# Patient Record
Sex: Male | Born: 1975 | Race: White | Hispanic: No | Marital: Married | Smoking: Former smoker
Health system: Southern US, Community
[De-identification: ages and names within clinical notes are randomized; demographics above are authoritative.]

## PROBLEM LIST (undated history)

## (undated) DIAGNOSIS — D649 Anemia, unspecified: Secondary | ICD-10-CM

## (undated) DIAGNOSIS — R0602 Shortness of breath: Secondary | ICD-10-CM

## (undated) DIAGNOSIS — K7460 Unspecified cirrhosis of liver: Secondary | ICD-10-CM

## (undated) DIAGNOSIS — K269 Duodenal ulcer, unspecified as acute or chronic, without hemorrhage or perforation: Secondary | ICD-10-CM

## (undated) DIAGNOSIS — I319 Disease of pericardium, unspecified: Secondary | ICD-10-CM

## (undated) DIAGNOSIS — K50913 Crohn's disease, unspecified, with fistula: Secondary | ICD-10-CM

## (undated) DIAGNOSIS — K760 Fatty (change of) liver, not elsewhere classified: Secondary | ICD-10-CM

## (undated) DIAGNOSIS — K7400 Hepatic fibrosis, unspecified: Secondary | ICD-10-CM

## (undated) DIAGNOSIS — K219 Gastro-esophageal reflux disease without esophagitis: Secondary | ICD-10-CM

## (undated) DIAGNOSIS — I313 Pericardial effusion (noninflammatory): Secondary | ICD-10-CM

## (undated) DIAGNOSIS — Z8711 Personal history of peptic ulcer disease: Secondary | ICD-10-CM

## (undated) DIAGNOSIS — I10 Essential (primary) hypertension: Secondary | ICD-10-CM

## (undated) DIAGNOSIS — K603 Anal fistula: Secondary | ICD-10-CM

## (undated) DIAGNOSIS — F32A Depression, unspecified: Secondary | ICD-10-CM

## (undated) DIAGNOSIS — K769 Liver disease, unspecified: Secondary | ICD-10-CM

## (undated) DIAGNOSIS — N200 Calculus of kidney: Secondary | ICD-10-CM

## (undated) DIAGNOSIS — N529 Male erectile dysfunction, unspecified: Secondary | ICD-10-CM

## (undated) DIAGNOSIS — K759 Inflammatory liver disease, unspecified: Secondary | ICD-10-CM

## (undated) DIAGNOSIS — K5090 Crohn's disease, unspecified, without complications: Secondary | ICD-10-CM

## (undated) DIAGNOSIS — F329 Major depressive disorder, single episode, unspecified: Secondary | ICD-10-CM

## (undated) DIAGNOSIS — Z9289 Personal history of other medical treatment: Secondary | ICD-10-CM

## (undated) DIAGNOSIS — F419 Anxiety disorder, unspecified: Secondary | ICD-10-CM

## (undated) DIAGNOSIS — J189 Pneumonia, unspecified organism: Secondary | ICD-10-CM

## (undated) DIAGNOSIS — R160 Hepatomegaly, not elsewhere classified: Secondary | ICD-10-CM

## (undated) DIAGNOSIS — Z8719 Personal history of other diseases of the digestive system: Secondary | ICD-10-CM

## (undated) HISTORY — PX: CHOLECYSTECTOMY: SHX55

## (undated) HISTORY — DX: Inflammatory liver disease, unspecified: K75.9

## (undated) HISTORY — PX: OTHER SURGICAL HISTORY: SHX169

## (undated) HISTORY — DX: Anal fistula: K60.3

## (undated) HISTORY — DX: Male erectile dysfunction, unspecified: N52.9

## (undated) HISTORY — DX: Crohn's disease, unspecified, without complications: K50.90

## (undated) HISTORY — DX: Anxiety disorder, unspecified: F41.9

## (undated) HISTORY — PX: APPENDECTOMY: SHX54

## (undated) HISTORY — DX: Fatty (change of) liver, not elsewhere classified: K76.0

## (undated) HISTORY — DX: Hepatomegaly, not elsewhere classified: R16.0

## (undated) HISTORY — DX: Gastro-esophageal reflux disease without esophagitis: K21.9

## (undated) HISTORY — DX: Crohn's disease, unspecified, with fistula: K50.913

---

## 2001-04-07 ENCOUNTER — Encounter: Payer: Self-pay | Admitting: Internal Medicine

## 2001-04-07 ENCOUNTER — Ambulatory Visit (HOSPITAL_COMMUNITY): Admission: RE | Admit: 2001-04-07 | Discharge: 2001-04-07 | Payer: Self-pay | Admitting: Internal Medicine

## 2001-04-20 ENCOUNTER — Ambulatory Visit (HOSPITAL_COMMUNITY): Admission: RE | Admit: 2001-04-20 | Discharge: 2001-04-20 | Payer: Self-pay | Admitting: Internal Medicine

## 2001-09-29 ENCOUNTER — Encounter (INDEPENDENT_AMBULATORY_CARE_PROVIDER_SITE_OTHER): Payer: Self-pay | Admitting: Internal Medicine

## 2001-09-29 ENCOUNTER — Ambulatory Visit (HOSPITAL_COMMUNITY): Admission: RE | Admit: 2001-09-29 | Discharge: 2001-09-29 | Payer: Self-pay | Admitting: Internal Medicine

## 2001-10-10 ENCOUNTER — Encounter (INDEPENDENT_AMBULATORY_CARE_PROVIDER_SITE_OTHER): Payer: Self-pay | Admitting: Internal Medicine

## 2001-10-10 ENCOUNTER — Ambulatory Visit (HOSPITAL_COMMUNITY): Admission: RE | Admit: 2001-10-10 | Discharge: 2001-10-10 | Payer: Self-pay | Admitting: Internal Medicine

## 2001-12-21 ENCOUNTER — Ambulatory Visit (HOSPITAL_COMMUNITY): Admission: RE | Admit: 2001-12-21 | Discharge: 2001-12-21 | Payer: Self-pay | Admitting: Internal Medicine

## 2002-03-02 ENCOUNTER — Encounter: Payer: Self-pay | Admitting: *Deleted

## 2002-03-02 ENCOUNTER — Inpatient Hospital Stay (HOSPITAL_COMMUNITY): Admission: EM | Admit: 2002-03-02 | Discharge: 2002-03-05 | Payer: Self-pay | Admitting: *Deleted

## 2002-05-16 ENCOUNTER — Ambulatory Visit (HOSPITAL_COMMUNITY): Admission: RE | Admit: 2002-05-16 | Discharge: 2002-05-16 | Payer: Self-pay | Admitting: Internal Medicine

## 2002-05-17 ENCOUNTER — Emergency Department (HOSPITAL_COMMUNITY): Admission: EM | Admit: 2002-05-17 | Discharge: 2002-05-17 | Payer: Self-pay | Admitting: *Deleted

## 2002-05-17 ENCOUNTER — Encounter (INDEPENDENT_AMBULATORY_CARE_PROVIDER_SITE_OTHER): Payer: Self-pay | Admitting: Internal Medicine

## 2002-06-23 ENCOUNTER — Ambulatory Visit (HOSPITAL_COMMUNITY): Admission: RE | Admit: 2002-06-23 | Discharge: 2002-06-23 | Payer: Self-pay | Admitting: General Surgery

## 2002-12-14 DIAGNOSIS — Z9289 Personal history of other medical treatment: Secondary | ICD-10-CM

## 2002-12-14 HISTORY — DX: Personal history of other medical treatment: Z92.89

## 2002-12-14 HISTORY — PX: GASTROJEJUNOSTOMY: SHX1697

## 2002-12-29 ENCOUNTER — Ambulatory Visit (HOSPITAL_COMMUNITY): Admission: RE | Admit: 2002-12-29 | Discharge: 2002-12-29 | Payer: Self-pay | Admitting: General Surgery

## 2003-03-16 ENCOUNTER — Emergency Department (HOSPITAL_COMMUNITY): Admission: EM | Admit: 2003-03-16 | Discharge: 2003-03-16 | Payer: Self-pay | Admitting: *Deleted

## 2003-03-16 ENCOUNTER — Encounter: Payer: Self-pay | Admitting: *Deleted

## 2003-03-27 ENCOUNTER — Ambulatory Visit (HOSPITAL_COMMUNITY): Admission: RE | Admit: 2003-03-27 | Discharge: 2003-03-27 | Payer: Self-pay | Admitting: Internal Medicine

## 2003-03-27 ENCOUNTER — Encounter: Payer: Self-pay | Admitting: Internal Medicine

## 2003-03-30 ENCOUNTER — Ambulatory Visit (HOSPITAL_COMMUNITY): Admission: RE | Admit: 2003-03-30 | Discharge: 2003-03-30 | Payer: Self-pay | Admitting: Internal Medicine

## 2003-03-30 ENCOUNTER — Encounter (INDEPENDENT_AMBULATORY_CARE_PROVIDER_SITE_OTHER): Payer: Self-pay | Admitting: Internal Medicine

## 2003-04-11 ENCOUNTER — Encounter (INDEPENDENT_AMBULATORY_CARE_PROVIDER_SITE_OTHER): Payer: Self-pay | Admitting: Internal Medicine

## 2003-04-11 ENCOUNTER — Ambulatory Visit (HOSPITAL_COMMUNITY): Admission: RE | Admit: 2003-04-11 | Discharge: 2003-04-11 | Payer: Self-pay | Admitting: Internal Medicine

## 2003-07-01 ENCOUNTER — Emergency Department (HOSPITAL_COMMUNITY): Admission: EM | Admit: 2003-07-01 | Discharge: 2003-07-02 | Payer: Self-pay | Admitting: *Deleted

## 2003-07-02 ENCOUNTER — Encounter: Payer: Self-pay | Admitting: *Deleted

## 2003-09-04 ENCOUNTER — Inpatient Hospital Stay (HOSPITAL_COMMUNITY): Admission: EM | Admit: 2003-09-04 | Discharge: 2003-09-10 | Payer: Self-pay | Admitting: Emergency Medicine

## 2003-09-05 ENCOUNTER — Encounter (INDEPENDENT_AMBULATORY_CARE_PROVIDER_SITE_OTHER): Payer: Self-pay | Admitting: Internal Medicine

## 2004-04-04 ENCOUNTER — Ambulatory Visit (HOSPITAL_COMMUNITY): Admission: RE | Admit: 2004-04-04 | Discharge: 2004-04-04 | Payer: Self-pay | Admitting: Internal Medicine

## 2004-12-16 ENCOUNTER — Ambulatory Visit: Payer: Self-pay | Admitting: Internal Medicine

## 2005-02-20 ENCOUNTER — Ambulatory Visit: Payer: Self-pay | Admitting: Internal Medicine

## 2005-02-20 ENCOUNTER — Ambulatory Visit (HOSPITAL_COMMUNITY): Admission: RE | Admit: 2005-02-20 | Discharge: 2005-02-20 | Payer: Self-pay | Admitting: Internal Medicine

## 2005-03-06 ENCOUNTER — Ambulatory Visit: Payer: Self-pay | Admitting: Internal Medicine

## 2005-04-08 ENCOUNTER — Ambulatory Visit: Payer: Self-pay | Admitting: Internal Medicine

## 2005-06-04 ENCOUNTER — Ambulatory Visit: Payer: Self-pay | Admitting: Internal Medicine

## 2005-06-19 ENCOUNTER — Ambulatory Visit (HOSPITAL_COMMUNITY): Admission: RE | Admit: 2005-06-19 | Discharge: 2005-06-19 | Payer: Self-pay | Admitting: Internal Medicine

## 2005-07-13 ENCOUNTER — Ambulatory Visit: Payer: Self-pay | Admitting: Internal Medicine

## 2005-07-13 ENCOUNTER — Ambulatory Visit (HOSPITAL_COMMUNITY): Admission: RE | Admit: 2005-07-13 | Discharge: 2005-07-13 | Payer: Self-pay | Admitting: Family Medicine

## 2005-07-30 ENCOUNTER — Ambulatory Visit: Payer: Self-pay | Admitting: Internal Medicine

## 2005-09-04 ENCOUNTER — Ambulatory Visit: Payer: Self-pay | Admitting: Internal Medicine

## 2005-09-09 ENCOUNTER — Ambulatory Visit (HOSPITAL_COMMUNITY): Admission: RE | Admit: 2005-09-09 | Discharge: 2005-09-09 | Payer: Self-pay | Admitting: Internal Medicine

## 2005-09-18 ENCOUNTER — Ambulatory Visit: Payer: Self-pay | Admitting: Internal Medicine

## 2005-10-15 ENCOUNTER — Ambulatory Visit: Payer: Self-pay | Admitting: Internal Medicine

## 2005-11-10 ENCOUNTER — Ambulatory Visit: Payer: Self-pay | Admitting: Internal Medicine

## 2006-02-01 ENCOUNTER — Ambulatory Visit: Payer: Self-pay | Admitting: Internal Medicine

## 2006-04-08 ENCOUNTER — Ambulatory Visit: Payer: Self-pay | Admitting: Internal Medicine

## 2006-05-02 ENCOUNTER — Emergency Department (HOSPITAL_COMMUNITY): Admission: EM | Admit: 2006-05-02 | Discharge: 2006-05-02 | Payer: Self-pay | Admitting: Emergency Medicine

## 2006-05-03 ENCOUNTER — Ambulatory Visit: Payer: Self-pay | Admitting: Internal Medicine

## 2006-05-06 ENCOUNTER — Ambulatory Visit (HOSPITAL_COMMUNITY): Admission: RE | Admit: 2006-05-06 | Discharge: 2006-05-06 | Payer: Self-pay | Admitting: Internal Medicine

## 2006-05-19 ENCOUNTER — Ambulatory Visit: Payer: Self-pay | Admitting: Internal Medicine

## 2006-05-28 ENCOUNTER — Encounter (HOSPITAL_COMMUNITY): Admission: RE | Admit: 2006-05-28 | Discharge: 2006-06-27 | Payer: Self-pay | Admitting: Internal Medicine

## 2006-05-28 ENCOUNTER — Ambulatory Visit (HOSPITAL_COMMUNITY): Payer: Self-pay | Admitting: Internal Medicine

## 2006-06-03 ENCOUNTER — Ambulatory Visit: Payer: Self-pay | Admitting: Internal Medicine

## 2006-07-28 ENCOUNTER — Ambulatory Visit: Payer: Self-pay | Admitting: Internal Medicine

## 2006-07-29 ENCOUNTER — Ambulatory Visit (HOSPITAL_COMMUNITY): Admission: RE | Admit: 2006-07-29 | Discharge: 2006-07-29 | Payer: Self-pay | Admitting: Internal Medicine

## 2006-07-29 ENCOUNTER — Ambulatory Visit: Payer: Self-pay | Admitting: Internal Medicine

## 2006-08-02 ENCOUNTER — Ambulatory Visit (HOSPITAL_COMMUNITY): Payer: Self-pay | Admitting: Internal Medicine

## 2006-08-02 ENCOUNTER — Encounter (HOSPITAL_COMMUNITY): Admission: RE | Admit: 2006-08-02 | Discharge: 2006-09-01 | Payer: Self-pay | Admitting: Oncology

## 2006-10-07 ENCOUNTER — Ambulatory Visit: Payer: Self-pay | Admitting: Internal Medicine

## 2006-10-11 ENCOUNTER — Ambulatory Visit (HOSPITAL_COMMUNITY): Payer: Self-pay | Admitting: Internal Medicine

## 2006-10-11 ENCOUNTER — Encounter (HOSPITAL_COMMUNITY): Admission: RE | Admit: 2006-10-11 | Discharge: 2006-11-10 | Payer: Self-pay | Admitting: Internal Medicine

## 2006-11-29 ENCOUNTER — Encounter (HOSPITAL_COMMUNITY): Admission: RE | Admit: 2006-11-29 | Discharge: 2006-12-13 | Payer: Self-pay | Admitting: Internal Medicine

## 2006-11-29 ENCOUNTER — Ambulatory Visit (HOSPITAL_COMMUNITY): Payer: Self-pay | Admitting: Internal Medicine

## 2006-12-01 ENCOUNTER — Ambulatory Visit: Payer: Self-pay | Admitting: Internal Medicine

## 2007-01-12 ENCOUNTER — Ambulatory Visit: Payer: Self-pay | Admitting: Internal Medicine

## 2007-02-04 ENCOUNTER — Encounter (HOSPITAL_COMMUNITY): Admission: RE | Admit: 2007-02-04 | Discharge: 2007-03-06 | Payer: Self-pay | Admitting: Internal Medicine

## 2007-03-17 ENCOUNTER — Ambulatory Visit: Payer: Self-pay | Admitting: Internal Medicine

## 2007-04-06 ENCOUNTER — Encounter (HOSPITAL_COMMUNITY): Admission: RE | Admit: 2007-04-06 | Discharge: 2007-05-06 | Payer: Self-pay | Admitting: Internal Medicine

## 2007-04-06 ENCOUNTER — Ambulatory Visit (HOSPITAL_COMMUNITY): Payer: Self-pay | Admitting: Internal Medicine

## 2007-06-06 ENCOUNTER — Encounter (HOSPITAL_COMMUNITY): Admission: RE | Admit: 2007-06-06 | Discharge: 2007-07-06 | Payer: Self-pay | Admitting: Internal Medicine

## 2007-08-08 ENCOUNTER — Ambulatory Visit (HOSPITAL_COMMUNITY): Payer: Self-pay | Admitting: Internal Medicine

## 2007-11-21 ENCOUNTER — Encounter (HOSPITAL_COMMUNITY): Admission: RE | Admit: 2007-11-21 | Discharge: 2007-12-14 | Payer: Self-pay | Admitting: Internal Medicine

## 2007-11-21 ENCOUNTER — Ambulatory Visit (HOSPITAL_COMMUNITY): Payer: Self-pay | Admitting: Internal Medicine

## 2008-01-11 ENCOUNTER — Ambulatory Visit (HOSPITAL_COMMUNITY): Payer: Self-pay | Admitting: Oncology

## 2008-01-11 ENCOUNTER — Encounter (HOSPITAL_COMMUNITY): Admission: RE | Admit: 2008-01-11 | Discharge: 2008-02-10 | Payer: Self-pay | Admitting: Oncology

## 2008-03-21 ENCOUNTER — Ambulatory Visit (HOSPITAL_COMMUNITY): Payer: Self-pay | Admitting: Family Medicine

## 2008-03-21 ENCOUNTER — Encounter (HOSPITAL_COMMUNITY): Admission: RE | Admit: 2008-03-21 | Discharge: 2008-04-20 | Payer: Self-pay | Admitting: Family Medicine

## 2008-04-20 ENCOUNTER — Ambulatory Visit (HOSPITAL_COMMUNITY): Admission: RE | Admit: 2008-04-20 | Discharge: 2008-04-20 | Payer: Self-pay | Admitting: Internal Medicine

## 2008-05-03 ENCOUNTER — Encounter (HOSPITAL_COMMUNITY): Admission: RE | Admit: 2008-05-03 | Discharge: 2008-06-02 | Payer: Self-pay | Admitting: Internal Medicine

## 2008-05-03 ENCOUNTER — Ambulatory Visit (HOSPITAL_COMMUNITY): Payer: Self-pay | Admitting: Internal Medicine

## 2008-06-14 ENCOUNTER — Encounter (HOSPITAL_COMMUNITY): Admission: RE | Admit: 2008-06-14 | Discharge: 2008-07-14 | Payer: Self-pay | Admitting: Internal Medicine

## 2008-07-13 ENCOUNTER — Ambulatory Visit (HOSPITAL_COMMUNITY): Admission: RE | Admit: 2008-07-13 | Discharge: 2008-07-13 | Payer: Self-pay | Admitting: General Surgery

## 2008-07-31 ENCOUNTER — Ambulatory Visit (HOSPITAL_COMMUNITY): Payer: Self-pay | Admitting: Internal Medicine

## 2008-07-31 ENCOUNTER — Encounter (HOSPITAL_COMMUNITY): Admission: RE | Admit: 2008-07-31 | Discharge: 2008-08-30 | Payer: Self-pay | Admitting: Internal Medicine

## 2008-09-25 ENCOUNTER — Encounter (HOSPITAL_COMMUNITY): Admission: RE | Admit: 2008-09-25 | Discharge: 2008-10-25 | Payer: Self-pay | Admitting: Internal Medicine

## 2008-09-28 ENCOUNTER — Ambulatory Visit (HOSPITAL_COMMUNITY): Payer: Self-pay | Admitting: Internal Medicine

## 2008-11-23 ENCOUNTER — Encounter (HOSPITAL_COMMUNITY): Admission: RE | Admit: 2008-11-23 | Discharge: 2008-12-23 | Payer: Self-pay | Admitting: Oncology

## 2008-12-14 ENCOUNTER — Emergency Department (HOSPITAL_COMMUNITY): Admission: EM | Admit: 2008-12-14 | Discharge: 2008-12-14 | Payer: Self-pay | Admitting: Emergency Medicine

## 2009-01-03 ENCOUNTER — Ambulatory Visit (HOSPITAL_COMMUNITY): Admission: RE | Admit: 2009-01-03 | Discharge: 2009-01-03 | Payer: Self-pay | Admitting: Internal Medicine

## 2009-01-24 ENCOUNTER — Ambulatory Visit (HOSPITAL_COMMUNITY): Admission: RE | Admit: 2009-01-24 | Discharge: 2009-01-24 | Payer: Self-pay | Admitting: Family Medicine

## 2009-10-17 ENCOUNTER — Ambulatory Visit (HOSPITAL_COMMUNITY): Payer: Self-pay | Admitting: Internal Medicine

## 2009-10-17 ENCOUNTER — Encounter (HOSPITAL_COMMUNITY): Admission: RE | Admit: 2009-10-17 | Discharge: 2009-11-16 | Payer: Self-pay | Admitting: Internal Medicine

## 2009-10-28 ENCOUNTER — Ambulatory Visit (HOSPITAL_COMMUNITY): Admission: RE | Admit: 2009-10-28 | Discharge: 2009-10-28 | Payer: Self-pay | Admitting: Internal Medicine

## 2009-12-12 ENCOUNTER — Encounter (HOSPITAL_COMMUNITY): Admission: RE | Admit: 2009-12-12 | Discharge: 2009-12-13 | Payer: Self-pay | Admitting: Internal Medicine

## 2009-12-12 ENCOUNTER — Ambulatory Visit (HOSPITAL_COMMUNITY): Payer: Self-pay | Admitting: Internal Medicine

## 2010-02-06 ENCOUNTER — Encounter (HOSPITAL_COMMUNITY): Admission: RE | Admit: 2010-02-06 | Discharge: 2010-03-08 | Payer: Self-pay | Admitting: Internal Medicine

## 2010-02-06 ENCOUNTER — Ambulatory Visit (HOSPITAL_COMMUNITY): Payer: Self-pay | Admitting: Internal Medicine

## 2010-04-03 ENCOUNTER — Encounter (HOSPITAL_COMMUNITY): Admission: RE | Admit: 2010-04-03 | Discharge: 2010-05-03 | Payer: Self-pay | Admitting: Internal Medicine

## 2010-04-03 ENCOUNTER — Ambulatory Visit (HOSPITAL_COMMUNITY): Payer: Self-pay | Admitting: Internal Medicine

## 2010-05-22 ENCOUNTER — Ambulatory Visit (HOSPITAL_COMMUNITY): Payer: Self-pay | Admitting: Internal Medicine

## 2010-05-22 ENCOUNTER — Encounter (HOSPITAL_COMMUNITY): Admission: RE | Admit: 2010-05-22 | Discharge: 2010-06-21 | Payer: Self-pay | Admitting: Internal Medicine

## 2010-07-10 ENCOUNTER — Ambulatory Visit (HOSPITAL_COMMUNITY): Payer: Self-pay | Admitting: Oncology

## 2010-07-10 ENCOUNTER — Encounter (HOSPITAL_COMMUNITY): Admission: RE | Admit: 2010-07-10 | Discharge: 2010-08-09 | Payer: Self-pay | Admitting: Oncology

## 2010-07-11 ENCOUNTER — Ambulatory Visit (HOSPITAL_COMMUNITY): Admission: RE | Admit: 2010-07-11 | Discharge: 2010-07-11 | Payer: Self-pay | Admitting: Internal Medicine

## 2010-07-25 ENCOUNTER — Emergency Department (HOSPITAL_COMMUNITY): Admission: EM | Admit: 2010-07-25 | Discharge: 2010-07-25 | Payer: Self-pay | Admitting: Emergency Medicine

## 2010-08-04 ENCOUNTER — Ambulatory Visit (HOSPITAL_COMMUNITY): Admission: RE | Admit: 2010-08-04 | Discharge: 2010-08-04 | Payer: Self-pay | Admitting: Urology

## 2010-08-06 ENCOUNTER — Ambulatory Visit (HOSPITAL_COMMUNITY): Admission: RE | Admit: 2010-08-06 | Discharge: 2010-08-06 | Payer: Self-pay | Admitting: Urology

## 2010-08-07 ENCOUNTER — Ambulatory Visit (HOSPITAL_COMMUNITY): Admission: RE | Admit: 2010-08-07 | Discharge: 2010-08-07 | Payer: Self-pay | Admitting: Urology

## 2010-09-11 ENCOUNTER — Ambulatory Visit (HOSPITAL_COMMUNITY): Admission: RE | Admit: 2010-09-11 | Discharge: 2010-09-11 | Payer: Self-pay | Admitting: Urology

## 2010-09-15 ENCOUNTER — Ambulatory Visit (HOSPITAL_COMMUNITY): Admission: RE | Admit: 2010-09-15 | Discharge: 2010-09-15 | Payer: Self-pay | Admitting: Internal Medicine

## 2010-09-23 ENCOUNTER — Ambulatory Visit: Payer: Self-pay | Admitting: Internal Medicine

## 2010-10-31 ENCOUNTER — Ambulatory Visit (HOSPITAL_COMMUNITY): Payer: Self-pay | Admitting: Psychiatry

## 2010-11-04 ENCOUNTER — Ambulatory Visit: Payer: Self-pay | Admitting: Internal Medicine

## 2010-11-14 ENCOUNTER — Ambulatory Visit (HOSPITAL_COMMUNITY)
Admission: RE | Admit: 2010-11-14 | Discharge: 2010-11-14 | Payer: Self-pay | Source: Home / Self Care | Admitting: General Surgery

## 2010-11-21 ENCOUNTER — Ambulatory Visit (HOSPITAL_COMMUNITY): Payer: Self-pay | Admitting: Psychiatry

## 2010-12-22 ENCOUNTER — Ambulatory Visit
Admission: RE | Admit: 2010-12-22 | Discharge: 2010-12-22 | Payer: Self-pay | Source: Home / Self Care | Attending: Internal Medicine | Admitting: Internal Medicine

## 2010-12-29 ENCOUNTER — Ambulatory Visit (HOSPITAL_COMMUNITY)
Admission: RE | Admit: 2010-12-29 | Discharge: 2010-12-29 | Payer: Self-pay | Source: Home / Self Care | Attending: Internal Medicine | Admitting: Internal Medicine

## 2011-01-02 ENCOUNTER — Ambulatory Visit (HOSPITAL_COMMUNITY): Admit: 2011-01-02 | Payer: Self-pay | Admitting: Psychiatry

## 2011-01-03 ENCOUNTER — Encounter (INDEPENDENT_AMBULATORY_CARE_PROVIDER_SITE_OTHER): Payer: Self-pay | Admitting: Internal Medicine

## 2011-01-04 ENCOUNTER — Encounter (INDEPENDENT_AMBULATORY_CARE_PROVIDER_SITE_OTHER): Payer: Self-pay | Admitting: Internal Medicine

## 2011-01-13 ENCOUNTER — Ambulatory Visit
Admission: RE | Admit: 2011-01-13 | Discharge: 2011-01-13 | Payer: Self-pay | Source: Home / Self Care | Attending: Internal Medicine | Admitting: Internal Medicine

## 2011-01-18 NOTE — Consult Note (Signed)
NAME:  David [name], David [name]               ACCOUNT NO.:  [id number]  MEDICAL RECORD NO.:  [id number]           PATIENT TYPE:  LOCATION:                                 FACILITY:  PHYSICIAN:  Lionel December, M.D.    DATE OF BIRTH:  [birth year]  DATE OF CONSULTATION: DATE OF DISCHARGE:                                CONSULTATION   He also had abdominopelvic CT which revealed stable thickening near to terminal ileum and gastrojejunostomy, segment of small bowel enteroenteric intussusception without bowel obstruction located in left midabdomen and evidence of fatty liver but no evidence of dilated bile duct.  It was decided to repeat his LFTs in 4 weeks.  The patient called last week stating that he was running fever and he was having worsening pain in right upper quadrant and left midabdomen.  We asked that he make an appointment to see Dr. Lovell Sheehan.  He was begun on Cipro and Flagyl p.o.  He states he had a temperature of 103 last week, Tuesday.  Lastnight, it was 100.  His appetite is fair.  He states he has constant pain in right upper quadrant, which is not affected by his meals or physical activity.  Pain in the left midabdomen is intermittent, described as dull pain, may occur a couple of times in a day and may last 5 to 10 minutes.  He is having 3 to 4 bowel movements per day. 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  He has an appointment to see Dr. Lovell Sheehan later today.  CURRENT MEDICATIONS:  Cipro 500 mg p.o. b.i.d., Flagyl 500 mg p.o. b.i.d., Protonix 40 mg p.o. q.a.m., oxycodone 10 mg q.i.d. p.r.n., fish oil 1 gm b.i.d., dicyclomine 20 mg t.i.d. p.r.n., nortriptyline 50 mg p.o. at bedtime, Phenergan 25 mg q.6 p.r.n., MVI daily, Humira 40 mg subcu every 2 weeks, and methotrexate 20 mg IM every week.  OBJECTIVE:  VITAL SIGNS:  Weight 228 pounds, he is 71 inches tall, pulse 78 per minute, blood pressure 130/84, temperature is  98.6. GENERAL:  He does not appear to be in any acute distress. HEENT:  Conjunctivae are pink.  Sclerae are nonicteric.  Oral pharyngeal mucosa is normal. NECK:  No neck masses or thyromegaly noted. ABDOMEN:  Symmetrical.  Bowel sounds are normal.  On palpation, soft abdomen with mild tenderness in right lower quadrant and left mid abdomen without organomegaly or without masses.  Liver edge is 5-6 cm below RCM, it is firm and mildly tender.  Spleen is not palpable. EXTREMITIES:  He does not have peripheral edema or clubbing.  Lab data from January 12, 2011, total bilirubin is 0.4, AP 203, SGOT 107, SGPT 180, albumin is 4.2.  ASSESSMENT:  David [name] has multiple issues. 1. Right upper quadrant pain felt to be due to fatty liver, perhaps     stretching Glisson capsule.  His transaminases are going up.  His     bile duct diameter is normal.  He has had biliary sphincterotomy a     few years ago.  I suspect his steatosis and  elevated transaminases     are secondary to methotrexate and he needs to come off this     medication. 2. Left-sided abdominal pain possibly related to his Crohn disease.  I     do not believe that it is due to intermittent short segment     enteroenteric intussusception. 3. 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  He had     spinous surgery by Dr. Lovell Sheehan.  He needs to be reevaluated.  This     may be the source of his fever.  His recent CT did not reveal any     abscess or progressive changes of ileitis and his CRP is normal.  PLAN:  Discontinue methotrexate.  Continue Cipro and Flagyl.  We will start him on prednisone 30 mg daily for 1 week and taper by 5 mg every week.  The patient will return for OV in 4 weeks.  He will have LFTs prior to that visit.     Lionel December, M.D.     NR/MEDQ  D:  01/14/2011  T:  01/14/2011  Job:  [id number]  cc:   Dr. Patsey Berthold, M.D. Fax: [phone]  Electronically Signed by Lionel December  M.D. on 01/18/2011 01:37:22 PM

## 2011-01-18 NOTE — Consult Note (Signed)
  NAME:  David [name], David [name]               ACCOUNT NO.:  [id number]  MEDICAL RECORD NO.:  [id number]           PATIENT TYPE:  LOCATION:                                 FACILITY:  PHYSICIAN:  Lionel December, M.D.    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  SUBJECTIVE:  This 35 year old Caucasian male patient of Dr. Lubertha South who was last seen on December 22, 2010, and he had multiple complaints. He was switched to pantoprazole for cost reason, given prescription for his pain medication.  He had CBC, Chem-20, CRP, and TSH.  His CRP was normal at 0.2.  Last he was as high as 12.  His WBC was 8.9, H and H was 13.7 and 40.8.  His LFTs, however, were abnormal.  His alkaline phosphatase was 183, SGOT 83, SGPT 130, his TSH was 2.46.      Lionel December, M.D.     NR/MEDQ  D:  01/13/2011  T:  01/14/2011  Job:  [id number]  Electronically Signed by Lionel December M.D. on 01/18/2011 01:36:07 PM

## 2011-01-30 ENCOUNTER — Encounter (INDEPENDENT_AMBULATORY_CARE_PROVIDER_SITE_OTHER): Payer: 59 | Admitting: Psychiatry

## 2011-01-30 DIAGNOSIS — F331 Major depressive disorder, recurrent, moderate: Secondary | ICD-10-CM

## 2011-02-03 ENCOUNTER — Ambulatory Visit (INDEPENDENT_AMBULATORY_CARE_PROVIDER_SITE_OTHER): Payer: Self-pay | Admitting: Internal Medicine

## 2011-02-09 ENCOUNTER — Ambulatory Visit (INDEPENDENT_AMBULATORY_CARE_PROVIDER_SITE_OTHER): Payer: Self-pay | Admitting: Internal Medicine

## 2011-02-10 ENCOUNTER — Ambulatory Visit (INDEPENDENT_AMBULATORY_CARE_PROVIDER_SITE_OTHER): Payer: 59 | Admitting: Internal Medicine

## 2011-02-10 DIAGNOSIS — K5090 Crohn's disease, unspecified, without complications: Secondary | ICD-10-CM

## 2011-02-11 HISTORY — PX: ANAL EXAMINATION UNDER ANESTHESIA: SHX1138

## 2011-02-24 LAB — SURGICAL PCR SCREEN
MRSA, PCR: NEGATIVE
Staphylococcus aureus: NEGATIVE

## 2011-02-24 LAB — CBC
HCT: 40 % (ref 39.0–52.0)
Hemoglobin: 13.8 g/dL (ref 13.0–17.0)
MCHC: 34.5 g/dL (ref 30.0–36.0)
RBC: 5.02 MIL/uL (ref 4.22–5.81)
WBC: 6.8 10*3/uL (ref 4.0–10.5)

## 2011-02-24 LAB — BASIC METABOLIC PANEL
Calcium: 9.1 mg/dL (ref 8.4–10.5)
GFR calc Af Amer: >60 mL/min (ref >60)
GFR calc non Af Amer: >60 mL/min (ref >60)
Glucose, Bld: 137 mg/dL — ABNORMAL HIGH (ref 70–99)
Potassium: 3.9 mEq/L (ref 3.5–5.1)
Sodium: 137 mEq/L (ref 135–145)

## 2011-02-27 ENCOUNTER — Encounter (HOSPITAL_COMMUNITY): Payer: 59 | Admitting: Psychiatry

## 2011-02-27 LAB — URINALYSIS, ROUTINE W REFLEX MICROSCOPIC
Bilirubin Urine: NEGATIVE
Glucose, UA: 250 mg/dL — AB
Ketones, ur: NEGATIVE mg/dL
Leukocytes, UA: NEGATIVE
Nitrite: NEGATIVE
Protein, ur: NEGATIVE mg/dL
Specific Gravity, Urine: 1.025 (ref 1.005–1.030)
Urobilinogen, UA: 2 mg/dL — ABNORMAL HIGH (ref 0.0–1.0)
pH: 6 (ref 5.0–8.0)

## 2011-02-27 LAB — URINE MICROSCOPIC-ADD ON

## 2011-02-28 LAB — CBC
Hemoglobin: 13.6 g/dL (ref 13.0–17.0)
MCH: 27.4 pg (ref 26.0–34.0)
MCHC: 33.8 g/dL (ref 30.0–36.0)
MCV: 81.2 fL (ref 78.0–100.0)
Platelets: 244 10*3/uL (ref 150–400)
RBC: 4.96 MIL/uL (ref 4.22–5.81)

## 2011-02-28 LAB — COMPREHENSIVE METABOLIC PANEL
BUN: 8 mg/dL (ref 6–23)
CO2: 24 mEq/L (ref 19–32)
Calcium: 9.2 mg/dL (ref 8.4–10.5)
Chloride: 100 mEq/L (ref 96–112)
Creatinine, Ser: 0.75 mg/dL (ref 0.4–1.5)
GFR calc non Af Amer: >60 mL/min (ref >60)
Total Bilirubin: 0.5 mg/dL (ref 0.3–1.2)

## 2011-02-28 LAB — DIFFERENTIAL
Basophils Absolute: 0 10*3/uL (ref 0.0–0.1)
Basophils Relative: 0 % (ref 0–1)
Neutro Abs: 5.9 10*3/uL (ref 1.7–7.7)
Neutrophils Relative %: 75 % (ref 43–77)

## 2011-03-13 ENCOUNTER — Encounter (INDEPENDENT_AMBULATORY_CARE_PROVIDER_SITE_OTHER): Payer: 59 | Admitting: Psychiatry

## 2011-03-13 DIAGNOSIS — F331 Major depressive disorder, recurrent, moderate: Secondary | ICD-10-CM

## 2011-04-28 NOTE — H&P (Signed)
NAME:  David [name], David [name]               ACCOUNT NO.:  [id number]   MEDICAL RECORD NO.:  [id number]          PATIENT TYPE:  AMB   LOCATION:  DAY                           FACILITY:  [facility]   PHYSICIAN:  Dalia Heading, M.D.  DATE OF BIRTH:  [birth year]   DATE OF ADMISSION:  DATE OF DISCHARGE:  LH                              HISTORY & PHYSICAL   CHIEF COMPLAINT:  Perirectal abscess, Crohn's disease.   HISTORY OF PRESENT ILLNESS:  The patient is a 35 year old white male who  is referred for evaluation and treatment of perirectal abscess.  Does  have a history of Crohn's disease.  He last had a perirectal drainage  done on 2004.  He started developing pain and swelling around the anus  five days ago.  No drainage has been noted.  He has not been on steroids  for over a year.   PAST MEDICAL HISTORY:  As noted above.   PAST SURGICAL HISTORY:  1. As noted above, .  2. Laparoscopic cholecystectomy and appendectomy in the remote past.  3. Intestinal surgery for perforated bowel at [facility] in 2004.   CURRENT MEDICATIONS:  Remicade, Paxil, Ciprofloxacin, Endocet, Prilosec.   ALLERGIES:  MORPHINE.   REVIEW OF SYSTEMS:  Noncontributory.   PHYSICAL EXAMINATION:  GENERAL:  The patient is a well-developed, well-  nourished white male in no acute distress.  LUNGS:  Clear to auscultation with good breath sounds bilaterally.  HEART:  Regular rate and rhythm without S3, S4, murmurs.  ABDOMEN:  Soft, nontender, nondistended.  No hepatosplenomegaly or  masses are noted.  RECTAL:  Swelling and induration along the posterior aspect of the anus.  Examination was limited secondary to pain.   IMPRESSION:  Perirectal abscess, Crohn's disease.   PLAN:  The patient is scheduled for incision and drainage of the  perirectal abscess on July 13, 2008.  The risks and benefits of the  procedure including bleeding, infection and recurrence of the abscess  were fully explained to the patient.  Gained informed  consent.      Dalia Heading, M.D.  Electronically Signed     MAJ/MEDQ  D:  07/12/2008  T:  07/12/2008  Job:  [id number]   cc:   [facility] Stay at [facility], M.D.  Fax: [phone]   Lionel December, M.D.  Fax: [phone]

## 2011-04-28 NOTE — Op Note (Signed)
NAME:  David [name], David [name]               ACCOUNT NO.:  [id number]   MEDICAL RECORD NO.:  [id number]          PATIENT TYPE:  AMB   LOCATION:  DAY                           FACILITY:  [facility]   PHYSICIAN:  Dalia Heading, M.D.  DATE OF BIRTH:  Mar 24, 1976   DATE OF PROCEDURE:  07/13/2008  DATE OF DISCHARGE:                               OPERATIVE REPORT   PREOPERATIVE DIAGNOSIS:  Perirectal abscess.   POSTOPERATIVE DIAGNOSIS:  Perirectal abscess.   PROCEDURE:  Incision and drainage of perirectal abscess.   SURGEON:  Dalia Heading, MD   ANESTHESIA:  General endotracheal.   INDICATIONS:  The patient is a 35 year old white male with a history of  Crohn disease, status post incision and drainage of perirectal abscess  in the remote past, now presents with a recurrent or a new abscess in a  different area.  The risks and benefits of the procedure including  bleeding, infection, the possibly of fistula, and the possibly  recurrence of the abscess were fully explained to the patient, gave  informed consent.   PROCEDURE NOTE:  The patient was placed in the lithotomy position after  induction of general endotracheal anesthesia.  The perineum was prepped  and draped using the usual sterile technique with Betadine.  Surgical  site confirmation was performed.   On inspection of the perianal region, the fluctuant area was noted at  the 7 o'clock position.  This did extend towards the anal verge.  The  abscess was incised and purulent fluid drained.  Anaerobic and aerobic  cultures were taken and sent to microbiology.  The abscess cavity was  curetted without difficulty.  It appeared that the abscess cavity  extended to the dentate line, but no direct connection to the lumen of  the rectum was noted.  This appeared to be submuscularis in nature.  The  wound was irrigated with normal saline.  The wound was packed with 1-  inch iodoform new gauze.  A 0.5% Sensorcaine was instilled into the  surrounding region.   All tape and needle counts were correct at the end of the procedure.  The patient was extubated in the operating room and went back to the  recovery room in awake and stable condition.   COMPLICATIONS:  None.   SPECIMEN:  Aerobic and anaerobic cultures of perirectal abscess.   ESTIMATED BLOOD LOSS:  Minimal.      Dalia Heading, M.D.  Electronically Signed     MAJ/MEDQ  D:  07/13/2008  T:  07/14/2008  Job:  [id number]   cc:   Lionel December, M.D.  Fax: [phone]   Donna Bernard, M.D.  Fax: [phone]

## 2011-05-01 NOTE — H&P (Signed)
   NAME:  David [name], Leamer NO.:  [id number]   MEDICAL RECORD NO.:  [id number]                   PATIENT TYPE:   LOCATION:                                       FACILITY:   PHYSICIAN:  Dalia Heading, M.D.               DATE OF BIRTH:  Sep 03, 1976   DATE OF ADMISSION:  12/29/2002  DATE OF DISCHARGE:                                HISTORY & PHYSICAL   CHIEF COMPLAINT:  Intractable cyst of left buttock.   HISTORY OF PRESENT ILLNESS:  The patient is a 35 year old, white male with a  history of Crohn's disease diagnosed in 2000, who now presents with a one-  year history of a recurrent draining cyst along the left buttock.  He has  had no fecal drainage noted from it and it is occasionally tender to touch.  No fever or chills are noted.   PAST MEDICAL HISTORY:  As noted in history of present illness.   PAST SURGICAL HISTORY:  1. Laparoscopic cholecystectomy.  2. Appendectomy in July 2003.  3. Oral surgery.   MEDICATIONS:  1. Prevacid.  2. Pentasa.  3. Azathioprine.  4. Hydrocodone for pain.   ALLERGIES:  No known drug allergies.   REVIEW OF SYMPTOMS:  Noncontributory.   PHYSICAL EXAMINATION:  GENERAL:  The patient is a well-developed, well-  nourished, white male in no acute distress.  VITAL SIGNS:  Afebrile and vital signs are stable.  LUNGS:  Clear to auscultation with equal breath sounds bilaterally.  HEART:  Regular rate and rhythm without S3, S4 or murmur.  EXTREMITIES:  Buttock examination reveals a 2 cm draining, cystic mass that  is greater than 4 cm from the anus.  No fecal drainage is noted.  Mild  induration is noted.  The drain is cloudy and white in nature.   IMPRESSION:  1. Intractable cyst of left buttock.  2. Crohn's disease.    PLAN:  The patient is scheduled for excision of the cyst on left buttock on  December 29, 2002.  The risks and benefits of the procedure including  bleeding, infection and recurrence of the cyst were  fully explained to the  patient and gained informed consent.                                               Dalia Heading, M.D.    MAJ/MEDQ  D:  12/21/2002  T:  12/22/2002  Job:  [id number]   cc:   Lionel December, M.D.  [address]  [place]  [place] [address]  Fax: [phone]   W. Simone Curia, M.D.  [address]  [place]  [place] [address]  Fax: [phone]

## 2011-05-01 NOTE — Discharge Summary (Signed)
NAME:  David [name], David [name]                         ACCOUNT NO.:  [id number]   MEDICAL RECORD NO.:  [id number]                   PATIENT TYPE:  INP   LOCATION:  A336                                 FACILITY:  [facility]   PHYSICIAN:  R. Roetta Sessions, M.D.              DATE OF BIRTH:  December 27, 1975   DATE OF ADMISSION:  09/04/2003  DATE OF DISCHARGE:  09/10/2003                                 DISCHARGE SUMMARY   DISCHARGE DIAGNOSES:  1. History of ileal Crohn's disease.  2. Nausea, vomiting, right upper quadrant pain secondary to #1.  3. Remote history of peptic ulcer disease, history of Helicobacter pylori     infection treated previously.  4. History of pancreatitis idiopathic.  5. History of elevated transaminases felt to be secondary to sphincter of     Oddi dysfunction status post ERCP with sphincterotomy April 2004 (small     stones/debris removed from his common bile duct at that time).   DISCHARGE ACTIVITY:  No work until seen by Dr. Karilyn Cota on September 19, 2003.   DIET:  Low residue.   ACTIVITY:  Gradual increase activity slowly.   DISCHARGE MEDICATIONS:  1. Prednisone 40 mg orally daily.  2. Flagyl 250 mg orally three times daily.  3. Reglan 10 mg before meals and at bedtime.  4. Protonix 40 mg one tablet twice daily.  5. Levbid one tablet twice daily.  6. Pentasa 1 g q.i.d.  7. Percocet 5/325 one tablet q.8h. as needed for pain.  8. Phenergan 25 mg suppositories one per rectum every 8 hours as needed for     nausea.   SPECIAL DISCHARGE INSTRUCTIONS:  Liver function studies to be drawn on  September 18, 2003.   DISCHARGE FOLLOWUP:  Appointment with Dr. Karilyn Cota September 19, 2003 at 8:30  a.m.   HOSPITAL COURSE:  Mr. Cabello is a 35 year old gentleman with proximal small  bowel and ileal Crohn's disease who has reported increasing nausea,  vomiting, right upper quadrant abdominal pain, low-grade temperature.   He recently stopped 6-MP as he and his wife were attempting to  conceive.   He was admitted from the office by Dr. Karilyn Cota.  He was started on IV  steroids, antibiotics in the way of Flagyl and Cipro and analgesics in the  way of morphine.  An abdominopelvic CT was reviewed with Drs. Jackson and  Boals, both the small and large  bowel really look good without any obvious  abscess or fistula, there were some long string-like structures in the colon  most consistent with his consumption of chicken noodle soup and spaghetti  recently.  He had intermittent nausea, vomiting, right upper quadrant pain  throughout his hospitalization.  He was initially on Solu-Medrol but he was  eventually able to be switched to prednisone 40 mg orally daily.  Of note,  his liver functions were elevated on September 06, 2003; his AST was 45, ALT  234, alkaline phosphatase 192; on September 07, 2003 ALT was 149, ALP was  144, total bilirubin remained normal.   Because he had a history of known duodenal ulceration secondary to Crohn's,  there was concern that he may have developed partial extrahepatic biliary  obstruction secondary to proximal duodenal ulceration.  Even though there is  no evidence of bile duct dilation on recent CT scan it was recommended he  undergo an EGD.  This was done on September 07, 2003.  Pertinent findings  included two long, furrowed ulcers beginning in the second portion of the  duodenum.  These were seen well with the Q-Scope and the side-viewing scope.  These were long ulcers on opposite walls beginning in the area of the  ampulla and extending well down into the third portion of the duodenum  indicating that his Crohn's disease had progressed over how things looked  earlier in the year.   He was seen by Dr. Leone Payor on September 25 and September 09, 2003, some of  the nausea Mr. Kreiter was experiencing was felt to have possibly been related  to the morphine, he was switched over the oral Percocet and Levbid was added  to his regimen.  There was  discussion in regards to tertiary referral given  his progressive apparently refractory proximal small bowel Crohn's disease.   The patient remained afebrile and hemodynamically stable throughout his  hospitalization.  Reglan helped his nausea and Percocet was controlling his  abdominal pain.  Cipro was discontinued.   Mr. Throne was tolerating a low residue diet with no nausea or vomiting in  the preceding 24 hours.  He was tolerating Reglan very well without any  apparent side effects.   He was not having any diarrhea, in fact had one single formed bowel  movement, over the last few days of his hospitalization it was felt that he  was stable and ready for discharge.   He went home stable/improved on the medical regimen outlined above.  He was  encouraged to obtain his labs and follow up with Dr. Karilyn Cota as scheduled.     ___________________________________________                                         Jonathon Bellows, M.D.   RMR/MEDQ  D:  09/24/2003  T:  09/24/2003  Job:  [id number]   cc:   Lionel December, M.D.  [address]  [place]  [place] [address]  Fax: [phone]   Kirk Ruths, M.D.  [address]  [place]  [place] [address]  Fax: [phone]

## 2011-05-01 NOTE — Op Note (Signed)
[facility]  Patient:    David [name], David [name] Visit Number: [id number] MRN: [id number]          Service Type: DSU Location: DAY Attending Physician:  Dalia Heading Dictated by:   Franky Macho, M.D. Proc. Date: 06/23/02 Admit Date:  06/23/2002 Discharge Date: 06/23/2002   CC:         Lionel December, M.D.  Donna Bernard, M.D.   Operative Report  PATIENT AGE:  35 years old  PREOPERATIVE DIAGNOSES: 1. Chronic cholecystitis. 2. Crohns disease.  POSTOPERATIVE DIAGNOSES: 1. Chronic cholecystitis. 2. Crohns disease.  OPERATION: 1. Laparoscopic cholecystectomy. 2. Incidental laparoscopic appendectomy.  SURGEON:  Franky Macho, M.D.  ANESTHESIA:  General endotracheal anesthesia.  INDICATIONS:  The patient is a 35 year old white male with Crohns disease who presents with chronic cholecystitis.  He now presents for laparoscopic cholecystectomy and an incidental laparoscopic appendectomy.   The risks and benefits of both procedures including bleeding, infection, hepatobiliary injury, and the possibility of an open procedure were fully explained to the patient who gave informed consent.  DESCRIPTION OF PROCEDURE:  The patient was placed in the supine position. After induction of general endotracheal anesthesia, the abdomen was prepped and draped using the usual sterile technique with Betadine.  A supraumbilical incision was made down to the fascia.  A Veress needle was introduced into the abdominal cavity, and confirmation of placement was done using the saline drop test.  The abdomen was then insufflated to 16 mmHg pressure.  An 11 mm trocar was introduced into the abdominal cavity under direct visualization without difficulty.  The patient was placed in reverse Trendelenburg position, and an additional 11 mm trocar was placed in the epigastric region, and 5 mm trocars were placed in the right upper quadrant and right flank regions.  The liver  was inspected and noted to be within normal limits.  The gallbladder was retracted superiorly and laterally.  The dissection was begun around the infundibulum of the gallbladder.  The cystic duct was first identified.  Its juncture to the infundibulum was fully identified.  Endoclips were placed proximally and distally on the cystic duct, and the cystic duct was divided.  This was likewise done on the cystic artery. The gallbladder was then freed away from the gallbladder fossa using Bovie electrocautery.  The gallbladder was delivered through the epigastric trocar site without difficulty.  The gallbladder fossa was inspected, and no abnormal bleeding or bile leakage was noted.  Surgicel was placed in the gallbladder fossa.  Next, I proceeded with the laparoscopic appendectomy.  An additional 12 mm trocar was placed in the suprapubic region, and a 5 mm trocar was placed in the left lower quadrant region.  The patient was placed in the Trendelenburg position.  The appendix was visualized.  It was noted to be adhesed along the right pericolic gutter, extending up to the liver.  The terminal ileum was inspected, and some mild adhesive disease was noted in the right lower quadrant.  The mesoappendix was divided with a harmonic scalpel once the appendix was freed away from the right pericolic gutter.  An Endo GIA was placed across the base of the appendix and fired.  The appendix was removed using the Endocatch bag without difficulty.  No bleeding was noted from the staple line.   All air was then evacuated from the peritoneal cavity prior to removal of the trocars.  All wounds were irrigated with normal saline.  All wounds were injected with 0.5% Sensorcaine.  The  suprapubic fascia as well as the supraumbilical fascia fascia were reapproximated using an 0 Vicryl interrupted sutures.  All skin incisions were closed using staples.  Betadine ointment and dry sterile dressings were  applied.  All tape and needle counts were correct at the end of the procedure.  The patient was extubated in the operating room and went back to the recovery room awake and in stable condition.  COMPLICATIONS:  None.  SPECIMEN:  1. Gallbladder.            2. Appendix.  ESTIMATED BLOOD LOSS:  Minimal. Dictated by:   Franky Macho, M.D. Attending Physician:  Dalia Heading DD:  06/23/02 TD:  06/26/02 Job: [id number]

## 2011-05-01 NOTE — Discharge Summary (Signed)
NAME:  David [name], David [name]                           ACCOUNT NO.:  [id number]   MEDICAL RECORD NO.:  [id number]                   PATIENT TYPE:   LOCATION:                                       FACILITY:   PHYSICIAN:  R. Roetta Sessions, M.D.              DATE OF BIRTH:   DATE OF ADMISSION:  03/02/2002  DATE OF DISCHARGE:  03/05/2002                                 DISCHARGE SUMMARY   DISCHARGE DIAGNOSES:  1. Pancreatitis, uncomplicated and idiopathic, at the time of discharge much     improved.  2. History of small bowel, duodenal Crohn's disease.  3. History of chronic gastroesophageal reflux disease.  4. History of hematuria and proteinuria with negative workup.   DISCHARGE INSTRUCTIONS:  Discharge Diet:  Low fat.  Discharge Activity:  Gradually resume normal activity.  Discharge Followup:  The patient is to  call Dr. Karilyn Cota, [phone], for followup appointment in the next 7-10 days.  Fasting lipid profile is pending at the time of discharge.   DISCHARGE MEDICATIONS:  1. 6-MP 100 mg daily.  2. Pancrease tablets 3 with meals and 2 with snacks daily.  3. Pentasa 1 g 4 tablets 4 times daily.  4. Prevacid 30 mg orally twice daily.   HOSPITAL COURSE:  This patient is a 35 year old gentleman who was in his  usual state of fair health until 03/02/02 when he developed acute onset of  nausea, vomiting, and epigastric/periumbilical abdominal pain.  His amylase  and lipase were elevated at 432 and 233.  Calcium was normal, LFTs were  normal.  Acute abdominal series revealed normal gastric pattern.  CT of the  abdomen and pelvis demonstrated changes consistent with pancreatitis  predominately localized to the head.   He was admitted to the hospital and placed n.p.o.  He was given IV emetic  and analgesic therapy.  His nausea, vomiting, and abdominal pain gradually  improved.  Amylase and lipase improved.  By the end of the second day of  hospitalization he was started on sips of clear  liquids.  By the second day  of hospitalization his diet was advanced.  There was no obvious reason for  his current disease although 6-MP has been associated with pancreatitis and  he previously was on the medication for quite some time prior to this  attack.  It was also postulated that the inflammatory process of his  duodenum could, somewhat, have produced ampullary obstruction.   On the day of discharge the patient was afebrile with normal vital signs and  taking a low fat diet.  He was discharged on Prevacid, pancreatic enzyme  supplements as well as 6-MP and Pentasa.  A lipid panel was pending at the  time of discharge.   The patient is to follow up with Dr. Karilyn Cota to decided about further  evaluation of  his recent bout of pancreatitis.  David [name], M.D.     RMR/MEDQ  D:  11/21/2002  T:  11/21/2002  Job:  [id number]

## 2011-05-01 NOTE — H&P (Signed)
[facility]  Patient:    David [name], David [name] Visit Number: [id number] MRN: [id number]          Service Type: OUT Location: RAD Attending Physician:  Malissa Hippo Dictated by:   Lionel December, M.D. Admit Date:  10/10/2001 Discharge Date: 10/10/2001   CC:         Elfredia Nevins, M.D.  [facility]   History and Physical  DATE OF BIRTH:  January 31, 1976  PRESENTING COMPLAINT:  Follow-up for Crohns disease.  Persistent postprandial nausea.  HISTORY OF PRESENT ILLNESS:  David [name] is a 35 year old Caucasian male, patient of Dr. Sherwood Gambler, who is here for scheduled visit.  He has duodenal as well as ileal and colonic Crohns disease.  For the last three months or so he has been experiencing postprandial nausea and vague discomfort.  The nausea may last for two to three hours.  He had a single episode of emesis a few months ago but none recently.  He states the heartburn and regurgitation have failed control with therapy.  He has a good appetite, but he is just afraid to eat because of nausea.  He has not noted any improvement since Prevacid was bumped to 30 mg b.i.d.  He generally has three to four stools per day, which is normal for him.  Most of his stools are soft, formed, but some are liquid.  He has occasional hematochezia which is scant in amount.  He has not lost any weight recently.  His last CBC was on September 02, 2001, which was within normal limits.  Upper abdominal ultrasound was obtained on September 29, 2001, which was within normal limits and was followed by upper GI/small bowel study done October 10, 2001.  It showed evidence of duodenal as well as ileal Crohns disease.  Comparison was made to previous study of April 2000, and narrowing to the second and third part of the duodenum was more pronounced than on the previous study; however, the changes were less pronounced or improved in the terminal ileum.  David [name] is presently on Asacol 1.2  g q.i.d., MVI q.d., Vicodin p.r.n. which he usually occasionally, 6-MP 100 mg q.d., Prevacid 30 mg b.i.d.  He does not take any NSAIDs.  PAST MEDICAL HISTORY:  He was diagnosed with duodenal ulcer (EGD) in May 1993, when he was 35 years old.  He was treated with PPI.  His gastrin level was normal.  His CLOtest was negative.  An H. pylori was done in June 1995, and was positive, and he was treated with H2 blocker, amoxicillin, and Biaxin for 10 days.  He was retreated in December 1998.  He presented with melena in April 2000.  He was found to have evidence of Crohns disease involving the second and third part of the duodenum.  Biopsy revealed granulomas.  He was treated with prednisone, and he relapsed as the dose was reduced.  He was given a single dose of Remicade infusion in August 2000.  He was begun on 6-MP in September 2000.  A second opinion was obtained from [facility], and they agreed with use of 6-MP for maintenance.  He did have a colonoscopy in June 2000, which showed multiple ulcers involving the terminal ileum, focal colitis at the sigmoid colon, and erosion just above the anorectal junction.  PAST SURGICAL HISTORY:  He has never had any surgeries.  ALLERGIES:  None known.  FAMILY HISTORY:  Negative for inflammatory bowel disease.  Parents and brother are in good  health.  SOCIAL HISTORY:  He is married.  His wife is towards the end of pregnancy. They are expecting their first child the end of this week.  Please note that 6-MP was discontinued for several months prior to conception.  He is still working with [employer], [employer], but also is working with [employer] part-time.  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  He drinks alcohol occasionally.  He smokes less than a pack per day.  PHYSICAL EXAMINATION:  GENERAL:  Well-developed, mildly obese Caucasian male who is in no acute distress.  VITAL SIGNS:  Weight 240 pounds,  height 5 feet 11 inches.  Pulse 80 per minute, blood pressure 122/70.  HEENT:  Conjunctivae pink.  Sclerae nonicteric.  Oropharyngeal mucosa normal.  NECK:  No thyromegaly or lymphadenopathy.  CARDIAC:  Regular rhythm.  Normal S1, S2.  No murmur or gallop noted.  LUNGS:  Clear to auscultation.  ABDOMEN:  Symmetrical.  Bowel sounds are normal.  Palpation reveals mild tenderness at RLQ and epigastric area, but no organomegaly or masses.  RECTAL:  Deferred.  EXTREMITIES:  No clubbing or edema noted.  ASSESSMENT:  David [name] remains with postprandial nausea which may last for a couple of hours.  Doubling the dose of PPI has not helped him.  His GERD symptoms are well controlled with dietary measures and PPI.  A recent GI study shows progression of narrowing involving postbulbar duodenum, and he, therefore, could be having delay in gastric emptying.  He may benefit from balloon dilatation of the stricture.  It is interesting to note that the terminal ileal disease has improved, and this is probably the result of Asacol working in that segment while Asacol would not be able to do so in the duodenum.  RECOMMENDATIONS:  EGD with balloon dilatation of duodenal stricture in the near future.  He will continue 6-MP and Asacol at present dose.  CBC will be checked today. Dictated by:   Lionel December, M.D. Attending Physician:  Malissa Hippo DD:  11/14/01 TD:  11/14/01 Job: [id number]

## 2011-05-01 NOTE — H&P (Signed)
NAME:  David [name], David [name]                         ACCOUNT NO.:  [id number]   MEDICAL RECORD NO.:  [id number]                   PATIENT TYPE:  INP   LOCATION:  A336                                 FACILITY:  [facility]   PHYSICIAN:  Lionel December, M.D.                 DATE OF BIRTH:  1976/04/21   DATE OF ADMISSION:  09/04/2003  DATE OF DISCHARGE:                                HISTORY & PHYSICAL   PRESENTING COMPLAINT:  Abdominal pain, nausea, vomiting and fever.   HISTORY OF PRESENT ILLNESS:  The patient is a 35 year old Caucasian male  with known history of duodenal, ileal and possibly colonic Crohn's disease  who was seen in the office last week because of abdominal pain and  intermittent diarrhea.  He was treated with antispasmodic.  He has not been  feeling well for the last two days.  He has had severe epigastric pain  radiating to the right of midline inferiorly and then to the left side  associated with nausea.  He has had sporadic vomiting.  He has also been  running a low-grade fever for the last two days.  He states he has not even  had a single meal since yesterday.  He had six bowel movements two days ago.  He took an antidiarrheal pill and he has not had a bowel movement.  He has  occasional hematochezia but denies melena or rectal bleeding.  He denies  sore throat, dyspnea, or cough.  He has been taking his medications as  prescribed.  However, he stopped his 6-MP last week as he and his wife are  trying to go for their second baby.  He had been on 6-MP for about 30 months  this time.  He denies hematuria or dysuria but has experienced intermittent  right flank pain.  He has not lost any weight recently.  He states he has  been under a lot of stress which is primarily related to his job.  He has  not taken any antibiotics recently.   MEDICATIONS:  He is on:  1. Prevacid 30 mg p.o. q.a.m.  2. Pentasa 1 gram q.i.d.  3. MVI every day.  4. Levsin/SL t.i.d. p.r.n.  5. Phenergan  25 mg b.i.d. p.r.n.  He has taken two doses since yesterday.  6. He also took Vicodin last night.  He does not take it very often.  7. He was on 6-MP until about 4-5 days ago.   PAST MEDICAL HISTORY:  1. He has duodenal, ileal and possibly colonic Crohn's disease.  He has been     on 6-MP for 30 months this time and he was also on it prior to their     first child.  He has been treated with steroids off and on.  He was given     a single dose of Remicade back in August 2000.  He apparently did not  have any symptomatic relief.  He had an ERCP in April 2004.  He was noted     to have a large duodenal ulcer just below the ampulla.  Biopsy from this     was consistent with Crohn's disease.  He also has distal small-bowel     disease and possibly colonic disease.  2. He has a remote history of peptic ulcer disease.  He was diagnosed when     he was 35 years old.  He responded to therapy.  3. I believe he has been treated for H. Pylori infection in the past.  4. The patient was hospitalized with pancreatitis in March 2003 which was     felt to be idiopathic.  5. He had a laparoscopic cholecystectomy for biliary dyskinesia in July     2003, along with that he had an incidental appendectomy.  6. He had a cyst removed from his left gluteal area in January 2004.  7. He had recurrent __________ pain and noted to have elevated transaminase.     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  8. He had some debris and/or tiny stones removed from his bile duct.  9. His pancreatic duct was within normal limits.  10.      He has chronic GERD.   ALLERGIES:  None known.   FAMILY HISTORY:  Noncontributory.  He has a brother age 41, who is diabetic  on oral hypoglycemics.   SOCIAL HISTORY:  He is married and has one son who is almost 74-years-old.  He and his wife are trying to have their second child and as a result, he   stopped his 6-MP recently, although, he did not inform of decision.  He quit  cigarette smoking about four years ago when his Crohn's disease was  diagnosed.  He smoked cigarettes from age [age].  He does not drink alcohol.  He is working with the sheriff's department.  He says his present assignment  is very stressful for him and he can wait to move to his next assignment,  but he is not sure when this would happen.   PHYSICAL EXAMINATION:  GENERAL:  A well developed, mildly obese, Caucasian  male who does not appear to be in any distress.  VITAL SIGNS:  He weighs 215.4 pounds.  He is 5 feet 11 inches tall.  Pulse  75 per minute, blood pressure 119/79, temp is 97.4, respirations 20.  HEENT:  His face is flushed.  Conjunctivae pink.  Sclerae nonicteric.  Oropharyngeal mucosa is normal.  NECK:  Without masses or thyromegaly.  CARDIAC:  Regular rhythm.  Normal S1, S2.  No murmur or gallop noted.  LUNGS:  Clear to auscultation.  ABDOMEN:  Symmetrical.  Bowel sounds are normal.  On palpation it is soft.  He has moderate tenderness in the epigastrium to the right of midline with  some guarding and he also has mild to moderate tenderness in the  periumbilical area and mild tenderness in both iliac fossae.  No  organomegaly or masses noted.  RECTAL:  Deferred.  EXTREMITIES:  He does not have peripheral edema or clubbing.   LAB STUDIES:  Pending.   ASSESSMENT:  Mike's symptoms of abdominal pain, nausea, vomiting and low-  grade fever are very suggestive of a flare up of his Crohn's disease.  This  may have been triggered by stress.  He has been on  6-MP chronically but he  stopped it last week and I doubt that this relapse is the result of drug  withdrawal.  I feel his symptoms are mainly from his duodenal Crohn's  disease.  He was noted to have a large duodenal ulcer at the time of ERCP  about five months ago.  This was noted, in spite of the fact that he was on Pentasa and 6-MP, indicating  that his disease has not been well controlled  with this therapy.    PLAN:  We will treat him with IV steroids, IV antibiotics, analgesia, and  proceed with abdominal pelvic CT.  We will also get CBC, MET-7, LFTs, and  lipase.  We will start him on Solu-Medrol 40 mg IV q.12h., Cipro 400 mg IV  q.12h., Flagyl 500 mg IV q.6h. and we will use morphine sulfate for pain  control.  We will leave him on clear liquids today.  If the abdominal pelvic  CT is unremarkable, may consider an EGD with attention to duodenal.     ___________________________________________                                         Lionel December, M.D.   NR/MEDQ  D:  09/04/2003  T:  09/04/2003  Job:  [id number]   cc:   Kirk Ruths, M.D.  [address]  [place]  [place] [address]  Fax: [phone]

## 2011-05-01 NOTE — H&P (Signed)
NAME:  David [name], David [name]                         ACCOUNT NO.:  [id number]   MEDICAL RECORD NO.:  [id number]                   PATIENT TYPE:   LOCATION:                                       FACILITY:   PHYSICIAN:  Lionel December, M.D.                 DATE OF BIRTH:  [birth year]   DATE OF ADMISSION:  DATE OF DISCHARGE:                                HISTORY & PHYSICAL   PRESENTING COMPLAINT:  Frequent epigastric and right upper quadrant pain.   HISTORY OF PRESENT ILLNESS:  The patient is a 35 year old Caucasian male  with known duodenal and ileal Crohn's disease who was last seen on March 26, 2003 because of upper and lower abdominal pain as well as vomiting and  constipation.  His CBC was normal.  His calcium  and serum amylase were also  normal, but his AST was 376, ALT was 280, AP was 203, and bilirubin was 0.6.  He was brought back for upper abdominal ultrasound which revealed no  abnormality.  The bile duct measured 6 mm.  He stopped by the day hospital  to see me on April 16 when he had the ultrasound.  I asked him to  discontinue 6-MP.  He is feeling a lot better.  He has not had any more  nausea or vomiting but he still has pain in his epigastrium and right upper  quadrant every time he eats.  He has been staying on very bland foods but  symptoms never go away.  He has not had any fever, chills, melena, or rectal  bleeding.  He also feels weak at times because he feels that he is not  getting all the food that he needs.  He has lost 3 pounds since his last  visit.   MEDICATIONS:  1. Prevacid 30 mg b.i.d.  2. Pentasa 1 gram q.i.d.  3. MVI q.d.  4. Levsin SL t.i.d. p.r.n.  5. Phenergan 25 mg q.6h. p.r.n.  6. Prednisone 30 mg q.a.m. which was begun on March 30, 2003.   PAST MEDICAL HISTORY:  History of peptic ulcer disease.  He has known  duodenal and ileal Crohn's disease.  Duodenal Crohn's disease was diagnosed  before the ileal disease was.  He has done fairly well with  6-MP.  He did  receive a single infusion of Remicade back in August 2000.  Every now and  then he has been treated with prednisone.  He was hospitalized for  pancreatitis in March 2003.  This was felt to be idiopathic.  At that time  his transaminases were normal and bile duct was also normal.  He had  laparoscopic cholecystectomy in July 2003 for what was felt to be biliary  dyskinesia; he had a very low EF.  He also had incidental appendectomy.  He  had a cyst removed from his left gluteal area in January 2004 by Dr.  Lovell Sheehan.  ALLERGIES:  No known allergies.   FAMILY HISTORY:  Negative for inflammatory bowel disease.   SOCIAL HISTORY:  He is married and has one child.  He works with the police  department.  He does not smoke cigarettes or drink alcohol.   PHYSICAL EXAMINATION:  GENERAL:  Pleasant, well-developed, well-nourished  Caucasian male who is in no acute distress.  VITAL SIGNS:  He weighs 225 pounds, he is 5 feet 10 inches tall.  Pulse 82  per minute, blood pressure 110/84; he is afebrile.  HEENT:  Conjunctivae are pink; sclerae are nonicteric.  NECK:  Without masses or thyromegaly.  HEART AND LUNG:  Sounds are within normal limits.  ABDOMEN:  Symmetrical and soft.  He has tenderness at mid epigastrium below  the right costal margin and at RLQ but is more so in right upper quadrant  area.  No organomegaly or masses.  EXTREMITIES:  He does not have peripheral edema or clubbing.   LABORATORY DATA:  LFTs from April 03, 2003:  Bilirubin 0.3, AP 122, AST 10,  ALT 33.  Please note his AST and ALT were 376 and 280 on March 27, 2003  respectively.   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  Not  mentioned above, he also had small bowel study on March 27, 2003 which  suggested duodenal and ileal Crohn's disease but this had not progressed  since his last study.  I feel that he  needs to have ERCP with spincterotomy.  He had an episode of pancreatitis in March 2003 which would also fit this  diagnosis, as would sudden rise in his transaminases and fall back to  normal.  His bile duct, however, is normal caliber which does not rule out  this diagnosis completely.  I feel we have enough information to proceed  with treatment rather than doing a biliary manometry, etc.   I have reviewed all the options with the patient and his wife, which include  biliary manometry, observation, and therapeutic ERCP.  They are both  agreeable to proceeding with the procedure.   PLAN:  He will have ERCP with endoscopic sphincterotomy and stenting only if  necessary next week.  He will have preoperative PT/PTT, bleeding time, be  typed and held for 2 units, and he will also have another set of  transaminases.  I have reviewed the risks of the procedure including  pancreatitis and bleeding.   He will be given antibiotics prior to the procedure.                                               Lionel December, M.D.    NR/MEDQ  D:  04/04/2003  T:  04/04/2003  Job:  [id number]   cc:   Donna Bernard, M.D.  [address]  [place]  [place] [address]  Fax: [phone]

## 2011-05-01 NOTE — Op Note (Signed)
NAME:  David [name], David [name]                           ACCOUNT NO.:  [id number]   MEDICAL RECORD NO.:  [id number]                   PATIENT TYPE:  AMB   LOCATION:  DAY                                  FACILITY:  [facility]   PHYSICIAN:  Dalia Heading, M.D.               DATE OF BIRTH:  [birth year]   DATE OF PROCEDURE:  12/29/2002  DATE OF DISCHARGE:                                 OPERATIVE REPORT   PREOPERATIVE DIAGNOSES:  1. Perirectal cyst, left buttock.  2. History of Crohn's disease.   POSTOPERATIVE DIAGNOSES:  1. Perirectal cyst, left buttock.  2. History of Crohn's disease.   PROCEDURE:  Excision of perirectal cyst.   SURGEON:  Dalia Heading, M.D.   ANESTHESIA:  General endotracheal.   INDICATIONS:  The patient is a 35 year old white male with a history of  Crohn's disease who presents with an ongoing draining cyst in the left  perirectal region.  The patient now comes to the operating room for excision  of the perirectal cyst.  The risks and benefits of the procedure, including  bleeding, infection, and recurrence of the cyst, were fully explained to the  patient, who gave informed consent.   DESCRIPTION OF PROCEDURE:  The patient was placed in the lithotomy position  after induction of general endotracheal anesthesia.  The perineum was  prepped and draped using the usual sterile technique with Betadine.  Surgical site confirmation was performed.   An elliptical incision was made around the cyst, which was at the 3 o'clock  position, approximately 4 cm outside the anus.  The dissection was taken  down to the subcutaneous tissue.  Granulomatous tissue was present.  A track  was then probed toward the posterior midline of the rectum.  There was no  continuity noted to the lumen of the rectum.  The fistulous tract was then  debrided using curettes.  Any bleeding was controlled using Bovie  electrocautery.  The wound was irrigated with normal saline.  The wound was  injected  with 0.5% Sensorcaine.  A single 4-0 Vicryl suture was placed to  reapproximate the subcutaneous tissue.  The skin was closed using 4-0 nylon  interrupted sutures.  Betadine ointment and dry sterile dressing were  applied.   All tape and needle counts were correct at the end of the procedure.  The  patient was extubated and transferred to PACU in stable condition.   COMPLICATIONS:  None.    SPECIMENS:  Perirectal cyst.   ESTIMATED BLOOD LOSS:  Minimal.                                               Dalia Heading, M.D.    MAJ/MEDQ  D:  12/29/2002  T:  12/29/2002  Job:  [id number]  cc:   Lionel December, M.D.  [address]  [name]  [place] [address]  Fax: [phone]   W. Simone Curia, M.D.  [address]  [place]  [place] [address]  Fax: [phone]

## 2011-05-01 NOTE — Op Note (Signed)
NAME:  David [name], David [name]                         ACCOUNT NO.:  [id number]   MEDICAL RECORD NO.:  [id number]                   PATIENT TYPE:  AMB   LOCATION:  DAY                                  FACILITY:  [facility]   PHYSICIAN:  Lionel December, M.D.                 DATE OF BIRTH:  05/26/1976   DATE OF PROCEDURE:  DATE OF DISCHARGE:                                 OPERATIVE REPORT   PROCEDURE:  Endoscopic retrograde cholangiopancreatography with  sphincterotomy.   ENDOSCOPIST:  Lionel December, M.D.   INDICATIONS FOR PROCEDURE:  David [name] is a 35 year old Caucasian male with  known duodenal and ileal and colon Crohn's disease who continues to have  biliary type of pain associated with nausea and vomiting.  He has had  laparoscopic cholecystectomy last year.  He was recently seen in the office  and noted to have markedly elevated transaminases.  Ultrasound revealed the  bile duct to be 7-mm.  I saw him, again, a few days later and transaminases  had returned to normal.  Based on fluctuating transaminases, I feel, that he  has sphincter of Oddi dysfunction.  He could also have small stones.  His  history is also pertinent for the fact that he was admitted with acute  pancreatitis one year ago and no etiology ever could be established.  My  concern is that he could have ampullary Crohn's disease resulting in these  symptoms.  The procedure and risks were reviewed with the patient and  informed consent was obtained.   PREOPERATIVE MEDICATIONS:  None.  He was given general endotracheal  anesthesia for the procedure.   Over in the OR he was given Glucagon 0.75 mg IV.   FINDINGS:  The procedure was performed in the OR.  After the patient was  placed under anesthesia and intubated he was positioned in the semiprone  position.   The therapeutic Olympus video duodenoscope was passed via the oropharynx  into the esophagus and stomach, and across the pylorus into the bulb and  descending duodenum.   There was about a rectangular ulcer measuring about 6  mm x 20 mm in the postampullary area.  The ampulla of Vater was very small.  It was located next to a diverticulum.  It appeared to have a very short  intermural segment.  Using ototome the pancreatic duct was initially  cannulated and gently filled and there were no mucosal abnormalities.   The bile duct was then selectively cannulated using the same catheter and  guidewire.  The CBD measured about 7-8 mm.  There was a short cystic duct  remnant. There were no filling defects of stones.  Intrahepatic biliary  radicals were normal.  I proceeded with sphincterotomy given his  symptomatology.  Sphincterotomy was performed. It was completed.  There was  a question of bile in contrast in the duodenum along with some sludge or  tiny stones.  On the way out biopsy was taken from the duodenal ulcer and  endoscope was withdrawn.   The patient tolerated the procedure well.   FINAL DIAGNOSES:  1. Large single ulceration in the second part of the duodenum away from the     ampulla felt to be due to Crohn's disease; biopsy taken.  2. Normal appearing ampulla with a very short intermural segment.  Normal     pancreatic duct. CBD mildly dilated, but no filling defects noted.     Sphincterotomy performed with satisfactory drainage.  Small stone with     sludge came out spontaneously.   PLAN:  He will be monitored in day hospital; and, if he does well, he will  be going home today, otherwise he will be kept in the hospital over night.                                               Lionel December, M.D.    NR/MEDQ  D:  04/11/2003  T:  04/11/2003  Job:  [id number]   cc:   Lorin Picket A. Gerda Diss, M.D.  [address]  [place]  [place] [address]  Fax: [phone]

## 2011-05-01 NOTE — Op Note (Signed)
NAME:  David, [name]                         ACCOUNT NO.:  [id number]   MEDICAL RECORD NO.:  [id number]                   PATIENT TYPE:  INP   LOCATION:  A336                                 FACILITY:  [facility]   PHYSICIAN:  R. Roetta Sessions, M.D.              DATE OF BIRTH:  [birth year]   DATE OF PROCEDURE:  09/07/2003  DATE OF DISCHARGE:                                 OPERATIVE REPORT   PROCEDURE:  Esophagogastroduodenoscopy.   INDICATIONS FOR PROCEDURE:  The patient is a 35 year old gentleman with  small bowel Crohn's disease admitted to the hospital with nausea, vomiting,  and upper abdominal pain.  He has had a bump in his transaminases as well  which have dropped significantly over the past 24 hours.  He has a history  of a duodenal ulcer in the area of the ampulla of Vater.  He previously  underwent a sphincterotomy and stone extraction.  He comes now for EGD to  further evaluate his right upper quadrant abdominal pain and nausea.  This  approach has been discussed with the patient at length previously and again  today at the bedside.  The potential risks, benefits, and alternatives have  been reviewed and questions answered.  Please see my documentation in the  medical record for more information.   PROCEDURE:  O2 saturation, blood pressure, pulses, and respirations were  monitored throughout the entire procedure.  Conscious sedation was with  Versed 6 mg IV, Demerol 150 mg IV in divided doses.  The instrument used was  the Olympus video chip adult gastroscope and therapeutic side-viewing  duodenoscope.   FINDINGS:  Examination of the tubular esophagus revealed no mucosal  abnormalities.  The EG junction was easily traversed.   Stomach:  The gastric cavity was empty and insufflated well with air.  Thorough examination of the gastric mucosa including retroflex view in the  proximal stomach and esophagogastric junction was undertaken.  There was  quite a bit of bile-stained  gastric mucous.  Otherwise, the gastric mucosa  appeared normal.  The pylorus was patent.   Duodenum:  Examination of the bulb and second portion revealed two long  linear ulcers on opposite sides of the duodenum.  One was on the medial wall  and one was on the lateral wall.  These extended distally.  The distal  extent could not be determined.  Subsequently, I withdrew the gastroscope  and obtained the duodenoscope and easily passed it down into the second  portion of the duodenum.  This confirmed, again, two long linear  ulcerations.  These ulcers extended well into the third portion of the  duodenum.  I could not see their distal extent once again.  The ulcer on the  medial wall came up in very close proximity of the ampulla, and the proximal  aspect of the ulcer may be involving the periampullary tissues.  Please see  photos.  THERAPEUTIC/DIAGNOSTIC MANEUVERS PERFORMED:  None.   The patient tolerated the somewhat prolonged procedure well and was reactive  in endoscopy.   IMPRESSION:  1. Normal esophagus.  2. Normal stomach.  3. Extensive ulceration of the second and third portion of the duodenum, as     described above, with the medial wall ulcer in close proximity to the     ampulla of Vater.   Today's findings indicate that his Crohn's disease has become much more  extensive than the findings described by Dr. Karilyn Cota earlier this year.   The shuddering nature of his transaminases make me suspicious that David [name]  has been experiencing partial distal extrahepatic biliary obstruction from  perhaps a traction effect from the ulcer in close proximity to the ampulla  of Vater.   For now, it is reassuring to note that his transaminases have markedly  dropped.  He has had some right upper quadrant fullness and nausea with  meals but has not really vomited since admission and has not had any  diarrhea.  He is afebrile.   RECOMMENDATIONS:  1. Will continue a low residue diet.   2. Plan to check LFTs tomorrow morning.  If they are stable to decreased,     will allow him to go home and follow up with Dr. Karilyn Cota next week and     decide about possibly repeating his ERCP.  3. If symptoms worsen or his transaminases bump, then he would most likely     get an ERCP over the weekend by Dr. Stan Head who will be covering for     the practice.      ___________________________________________                                            David [name], M.D.   RMR/MEDQ  D:  09/07/2003  T:  09/07/2003  Job:  [id number]   cc:   [facility]   [facility]

## 2011-05-04 ENCOUNTER — Ambulatory Visit (INDEPENDENT_AMBULATORY_CARE_PROVIDER_SITE_OTHER): Payer: 59 | Admitting: Internal Medicine

## 2011-05-04 DIAGNOSIS — K603 Anal fistula, unspecified: Secondary | ICD-10-CM

## 2011-05-04 DIAGNOSIS — K50913 Crohn's disease, unspecified, with fistula: Secondary | ICD-10-CM

## 2011-05-04 DIAGNOSIS — K219 Gastro-esophageal reflux disease without esophagitis: Secondary | ICD-10-CM

## 2011-05-04 DIAGNOSIS — R160 Hepatomegaly, not elsewhere classified: Secondary | ICD-10-CM

## 2011-05-04 DIAGNOSIS — R7989 Other specified abnormal findings of blood chemistry: Secondary | ICD-10-CM

## 2011-05-04 DIAGNOSIS — K5090 Crohn's disease, unspecified, without complications: Secondary | ICD-10-CM

## 2011-05-04 DIAGNOSIS — K760 Fatty (change of) liver, not elsewhere classified: Secondary | ICD-10-CM

## 2011-05-04 HISTORY — DX: Anal fistula, unspecified: K60.30

## 2011-05-04 HISTORY — DX: Anal fistula: K60.3

## 2011-05-04 HISTORY — DX: Crohn's disease, unspecified, with fistula: K50.913

## 2011-05-04 HISTORY — DX: Hepatomegaly, not elsewhere classified: R16.0

## 2011-05-04 HISTORY — DX: Fatty (change of) liver, not elsewhere classified: K76.0

## 2011-06-05 ENCOUNTER — Encounter (INDEPENDENT_AMBULATORY_CARE_PROVIDER_SITE_OTHER): Payer: 59 | Admitting: Psychiatry

## 2011-06-05 DIAGNOSIS — F331 Major depressive disorder, recurrent, moderate: Secondary | ICD-10-CM

## 2011-07-03 ENCOUNTER — Encounter (INDEPENDENT_AMBULATORY_CARE_PROVIDER_SITE_OTHER): Payer: Self-pay

## 2011-07-03 HISTORY — PX: TREATMENT FISTULA ANAL: SUR1390

## 2011-07-16 ENCOUNTER — Telehealth (INDEPENDENT_AMBULATORY_CARE_PROVIDER_SITE_OTHER): Payer: Self-pay | Admitting: *Deleted

## 2011-07-16 DIAGNOSIS — K5090 Crohn's disease, unspecified, without complications: Secondary | ICD-10-CM

## 2011-07-16 NOTE — Telephone Encounter (Signed)
Lab order faxed and letter sent to the patient..  

## 2011-07-21 ENCOUNTER — Other Ambulatory Visit (INDEPENDENT_AMBULATORY_CARE_PROVIDER_SITE_OTHER): Payer: Self-pay | Admitting: Internal Medicine

## 2011-07-21 ENCOUNTER — Encounter (INDEPENDENT_AMBULATORY_CARE_PROVIDER_SITE_OTHER): Payer: Self-pay | Admitting: Internal Medicine

## 2011-07-21 ENCOUNTER — Encounter (INDEPENDENT_AMBULATORY_CARE_PROVIDER_SITE_OTHER): Payer: Self-pay | Admitting: *Deleted

## 2011-07-21 ENCOUNTER — Ambulatory Visit (INDEPENDENT_AMBULATORY_CARE_PROVIDER_SITE_OTHER): Payer: 59 | Admitting: Internal Medicine

## 2011-07-21 VITALS — BP 120/74 | HR 76 | Temp 97.2°F | Ht 71.0 in | Wt 232.0 lb

## 2011-07-21 DIAGNOSIS — K5080 Crohn's disease of both small and large intestine without complications: Secondary | ICD-10-CM

## 2011-07-21 DIAGNOSIS — K50813 Crohn's disease of both small and large intestine with fistula: Secondary | ICD-10-CM

## 2011-07-21 DIAGNOSIS — R7989 Other specified abnormal findings of blood chemistry: Secondary | ICD-10-CM

## 2011-07-21 NOTE — Progress Notes (Signed)
Presenting complaint; patient here for followup of his Crohn's disease and abnormal LFTs. Subjective; patient is 35 year old male who is therefore scheduled visit. 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 Durenda Hurt of [facility]. His wife is doing the daily packing. His next visit with Dr. Byrd Hesselbach is later this month. He is taking oxycodone at a higher dose and more frequently since his surgery. He was she had been on oxycodone 10 mg 3 times a day. He is generally having one soft stool per day. He denies rectal bleeding fever or chills. He has some pain in the right lower quadrant of his abdomen and also under the right rib cage if he bumps into a firm object a chair etc. he has not had planned blood work yet; he would go to the lab later today. His heartburn is well controlled. He is not having side effects with Humira. Current medications; Current Outpatient Prescriptions on File Prior to Visit  Medication Sig Dispense Refill  . dicyclomine (BENTYL) 20 MG tablet Take 20 mg by mouth 2 (two) times daily.       . enalapril (VASOTEC) 10 MG tablet Take 10 mg by mouth daily.        . fish oil-omega-3 fatty acids 1000 MG capsule Take 2 g by mouth daily.        . nortriptyline (PAMELOR) 10 MG capsule Take 50 mg by mouth. Patient takes 2 at Bedtime      . oxyCODONE (OXYCONTIN) 10 MG 12 hr tablet Take 20 mg by mouth. Patient takes 20 mg every 4  hours      . pantoprazole (PROTONIX) 40 MG tablet Take 40 mg by mouth daily.        . promethazine (PHENERGAN) 25 MG tablet Take 25 mg by mouth. As Needed       Objective BP 120/74  Pulse 76  Temp(Src) 97.2 F (36.2 C) (Oral)  Ht 5\' 11"  (1.803 m)  Wt 232 lb (105.235 kg)  BMI 32.36 kg/m2 Conjunctiva is pink and sclerae nonicteric. Oropharyngeal mucosa is normal. No neck masses or thyromegaly noted. Cardiac exam with a regular rhythm normal S1 and S2. No murmur noted. Lungs are clear to  auscultation. Abdomen is full wide upper abdominal scar. Abdomen is soft with mild tenderness at RLQ; liver is 4-5 cm below RCM; it is firm and tender; spleen is not palpable. He does not have peripheral edema, clubbing or skin rash. Assessment #1. 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 It will take several weeks or perianal wound to heal. #2. Hepatomegaly with abnormal LFTs. He has been off his methotrexate since 01/13/2011 her six-months. I am afraid he may also have fatty liver or tonic liver injury secondary to Crohn's disease. 3. GERD. Symptoms well controlled with PPI. Recommendations He'll go to the lab for CBC and comprehensive chemistry panel. Have also asked lab to freeze  serum for 7 days in case more studies are needed

## 2011-07-21 NOTE — Patient Instructions (Addendum)
No changes made in your medications today. Dr .Karilyn Cota will call with results of lab when ready. Resume daily walking in order to lose weght.

## 2011-07-22 LAB — CBC WITH DIFFERENTIAL/PLATELET
Eosinophils Absolute: 0.3 10*3/uL (ref 0.0–0.7)
Eosinophils Relative: 3 % (ref 0–5)
HCT: 35.5 % — ABNORMAL LOW (ref 39.0–52.0)
Lymphocytes Relative: 16 % (ref 12–46)
Lymphs Abs: 1.2 10*3/uL (ref 0.7–4.0)
Monocytes Relative: 9 % (ref 3–12)
Neutrophils Relative %: 71 % (ref 43–77)
Platelets: 223 10*3/uL (ref 150–400)

## 2011-07-22 LAB — COMPREHENSIVE METABOLIC PANEL
ALT: 181 U/L — ABNORMAL HIGH (ref 0–53)
AST: 349 U/L — ABNORMAL HIGH (ref 0–37)
Albumin: 3.8 g/dL (ref 3.5–5.2)
Calcium: 9 mg/dL (ref 8.4–10.5)
Chloride: 100 mEq/L (ref 96–112)
Creat: 0.7 mg/dL (ref 0.50–1.35)
Potassium: 4.5 mEq/L (ref 3.5–5.3)
Sodium: 135 mEq/L (ref 135–145)
Total Protein: 6.4 g/dL (ref 6.0–8.3)

## 2011-07-23 ENCOUNTER — Other Ambulatory Visit (INDEPENDENT_AMBULATORY_CARE_PROVIDER_SITE_OTHER): Payer: Self-pay | Admitting: Internal Medicine

## 2011-07-23 DIAGNOSIS — G8918 Other acute postprocedural pain: Secondary | ICD-10-CM

## 2011-07-23 LAB — IRON AND TIBC
%SAT: 4 % — ABNORMAL LOW (ref 20–55)
Iron: 15 ug/dL — ABNORMAL LOW (ref 42–165)
TIBC: 388 ug/dL (ref 215–435)

## 2011-07-23 LAB — VITAMIN B12: Vitamin B-12: 1156 pg/mL — ABNORMAL HIGH (ref 211–911)

## 2011-07-23 LAB — HEPATITIS C ANTIBODY: HCV Ab: NEGATIVE

## 2011-07-23 NOTE — Telephone Encounter (Signed)
[facility]str[facility] lad called and labs have been added per Dr. Karilyn Cota.

## 2011-07-24 ENCOUNTER — Other Ambulatory Visit (INDEPENDENT_AMBULATORY_CARE_PROVIDER_SITE_OTHER): Payer: Self-pay | Admitting: Internal Medicine

## 2011-07-24 LAB — ALPHA-1-ANTITRYPSIN: A-1 Antitrypsin, Ser: 156 mg/dL (ref 90–200)

## 2011-07-24 LAB — HEPATITIS B SURFACE ANTIBODY,QUALITATIVE: Hep B S Ab: POSITIVE — AB

## 2011-07-24 LAB — CERULOPLASMIN: Ceruloplasmin: 30 mg/dL (ref 20–60)

## 2011-07-24 MED ORDER — DICYCLOMINE HCL 20 MG PO TABS: 20.0000 mg | ORAL_TABLET | Freq: Three times a day (TID) | ORAL | Status: DC

## 2011-07-27 ENCOUNTER — Other Ambulatory Visit (INDEPENDENT_AMBULATORY_CARE_PROVIDER_SITE_OTHER): Payer: Self-pay | Admitting: Internal Medicine

## 2011-07-27 DIAGNOSIS — R160 Hepatomegaly, not elsewhere classified: Secondary | ICD-10-CM

## 2011-07-31 ENCOUNTER — Other Ambulatory Visit: Payer: Self-pay | Admitting: Diagnostic Radiology

## 2011-07-31 ENCOUNTER — Other Ambulatory Visit (INDEPENDENT_AMBULATORY_CARE_PROVIDER_SITE_OTHER): Payer: Self-pay | Admitting: Internal Medicine

## 2011-07-31 ENCOUNTER — Ambulatory Visit (HOSPITAL_COMMUNITY): Payer: 59

## 2011-07-31 ENCOUNTER — Ambulatory Visit (HOSPITAL_COMMUNITY)
Admission: RE | Admit: 2011-07-31 | Discharge: 2011-07-31 | Disposition: A | Discharge status: Home / Self Care | Payer: 59 | Source: Ambulatory Visit | Attending: Internal Medicine | Admitting: Internal Medicine

## 2011-07-31 DIAGNOSIS — K5090 Crohn's disease, unspecified, without complications: Secondary | ICD-10-CM | POA: Insufficient documentation

## 2011-07-31 DIAGNOSIS — R160 Hepatomegaly, not elsewhere classified: Secondary | ICD-10-CM

## 2011-07-31 DIAGNOSIS — R7989 Other specified abnormal findings of blood chemistry: Secondary | ICD-10-CM | POA: Insufficient documentation

## 2011-07-31 LAB — CBC
MCH: 26.1 pg (ref 26.0–34.0)
MCHC: 32.3 g/dL (ref 30.0–36.0)
Platelets: 190 10*3/uL (ref 150–400)
RDW: 15.3 % (ref 11.5–15.5)
WBC: 5.8 10*3/uL (ref 4.0–10.5)

## 2011-07-31 LAB — PROTIME-INR: Prothrombin Time: 13.6 seconds (ref 11.6–15.2)

## 2011-08-03 ENCOUNTER — Telehealth (INDEPENDENT_AMBULATORY_CARE_PROVIDER_SITE_OTHER): Payer: Self-pay | Admitting: *Deleted

## 2011-08-03 DIAGNOSIS — K5090 Crohn's disease, unspecified, without complications: Secondary | ICD-10-CM

## 2011-08-03 NOTE — Telephone Encounter (Signed)
David [name] lmom asking for [name] to call

## 2011-08-03 NOTE — Telephone Encounter (Signed)
Per Dr. Karilyn Cota the patient will need a CBC/D in 4 weeks. Lab is noted in future labs for 09-01-11.
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
## 2011-08-13 ENCOUNTER — Telehealth (INDEPENDENT_AMBULATORY_CARE_PROVIDER_SITE_OTHER): Payer: Self-pay | Admitting: *Deleted

## 2011-08-13 DIAGNOSIS — K5090 Crohn's disease, unspecified, without complications: Secondary | ICD-10-CM

## 2011-08-13 NOTE — Progress Notes (Signed)
Labs Noted for October.

## 2011-08-13 NOTE — Progress Notes (Signed)
Lab Order Noted and Bx faxed to Dr. Gerda Diss.

## 2011-08-13 NOTE — Telephone Encounter (Signed)
Labs to be drawn 10-13-11

## 2011-08-19 ENCOUNTER — Encounter (INDEPENDENT_AMBULATORY_CARE_PROVIDER_SITE_OTHER): Payer: Self-pay | Admitting: *Deleted

## 2011-08-20 ENCOUNTER — Telehealth (INDEPENDENT_AMBULATORY_CARE_PROVIDER_SITE_OTHER): Payer: Self-pay | Admitting: *Deleted

## 2011-08-20 ENCOUNTER — Other Ambulatory Visit (INDEPENDENT_AMBULATORY_CARE_PROVIDER_SITE_OTHER): Payer: Self-pay | Admitting: Internal Medicine

## 2011-08-20 DIAGNOSIS — K5090 Crohn's disease, unspecified, without complications: Secondary | ICD-10-CM

## 2011-08-20 NOTE — Telephone Encounter (Signed)
Dr. Karilyn Cota -  David [name] called and states that he doesn't feel that the Humira is working, his last injection was 08-09-11. Last week he started feeling discomfort in the abdominal area but this week it has worsened to the point it hurts to walk across the floor. States that the pain is especially where the Crohn's is. Taking his pain medication and says that it helps some. He would like to know what else may be done.

## 2011-08-20 NOTE — Telephone Encounter (Signed)
If patient does not want to go to emergency room we'll start him on prednisone Cipro Flagyl and scheduled for abdominopelvic CT

## 2011-08-20 NOTE — Telephone Encounter (Signed)
I talked with Dr. Karilyn Cota. He states that if David [name]'s pain is this to the point of him hurting to walk across floor he should go to the ER to be evaluated by the Doctor there. Secondly, he can have a Abdominal/Pelvic CT on 08-21-11 , and start Prednisone 30 mg by mouth for 2 weeks, then 25 mg by mouth for 1 week , 20 mg by mouth for 1 week , 15 mg by mouth for 1 week, 10 mg by mouth for 1 week, 5 mg by mouth for 1 week.  I called David [name] and told him Dr. Patty Sermons recommendation , he chose to have CT and start the medications. I called the medications as noted above to [facility] as noted above.  Forwarded to Dewayne Hatch to arrange the Abdominal/Pelvic CT 08-21-11. Dx:[phone] , [phone]

## 2011-08-20 NOTE — Telephone Encounter (Signed)
CT sch'd 08/21/11 @ 4:00, need to pick up contrast, patient aware

## 2011-08-21 ENCOUNTER — Ambulatory Visit (HOSPITAL_COMMUNITY)
Admission: RE | Admit: 2011-08-21 | Discharge: 2011-08-21 | Disposition: A | Discharge status: Home / Self Care | Payer: 59 | Source: Ambulatory Visit | Attending: Internal Medicine | Admitting: Internal Medicine

## 2011-08-21 DIAGNOSIS — R935 Abnormal findings on diagnostic imaging of other abdominal regions, including retroperitoneum: Secondary | ICD-10-CM | POA: Insufficient documentation

## 2011-08-21 DIAGNOSIS — R109 Unspecified abdominal pain: Secondary | ICD-10-CM | POA: Insufficient documentation

## 2011-08-21 DIAGNOSIS — K5090 Crohn's disease, unspecified, without complications: Secondary | ICD-10-CM

## 2011-08-21 DIAGNOSIS — K7689 Other specified diseases of liver: Secondary | ICD-10-CM | POA: Insufficient documentation

## 2011-08-21 DIAGNOSIS — R188 Other ascites: Secondary | ICD-10-CM | POA: Insufficient documentation

## 2011-08-21 MED ORDER — IOHEXOL 300 MG/ML  SOLN
100.0000 mL | Freq: Once | INTRAMUSCULAR | Status: AC | PRN
  Administered 2011-08-21: 100 mL via INTRAVENOUS

## 2011-08-26 ENCOUNTER — Telehealth (INDEPENDENT_AMBULATORY_CARE_PROVIDER_SITE_OTHER): Payer: Self-pay | Admitting: *Deleted

## 2011-08-26 ENCOUNTER — Other Ambulatory Visit (INDEPENDENT_AMBULATORY_CARE_PROVIDER_SITE_OTHER): Payer: Self-pay | Admitting: Internal Medicine

## 2011-08-26 DIAGNOSIS — K5090 Crohn's disease, unspecified, without complications: Secondary | ICD-10-CM

## 2011-08-26 NOTE — Telephone Encounter (Signed)
These are updates for Labs after David [name] had his CT. Faxed to [facility] and [name] made aware. We would like to have results prior to tomorrow's office visit.Marland Kitchen

## 2011-08-26 NOTE — Progress Notes (Signed)
Lab Noted , and faxed to [facility]. They are to include per Dr. Karilyn Cota , CBC/D, C-MET, INR, AFP. Haaris was informed. CT results faxed to PCP.

## 2011-08-26 NOTE — Progress Notes (Signed)
Labs noted and CT results faxed to Dr. Gerda Diss.

## 2011-08-27 ENCOUNTER — Ambulatory Visit (INDEPENDENT_AMBULATORY_CARE_PROVIDER_SITE_OTHER): Payer: 59 | Admitting: Internal Medicine

## 2011-08-27 ENCOUNTER — Encounter (INDEPENDENT_AMBULATORY_CARE_PROVIDER_SITE_OTHER): Payer: Self-pay | Admitting: Internal Medicine

## 2011-08-27 VITALS — BP 140/86 | HR 78 | Temp 98.3°F | Ht 71.0 in | Wt 228.0 lb

## 2011-08-27 DIAGNOSIS — K529 Noninfective gastroenteritis and colitis, unspecified: Secondary | ICD-10-CM

## 2011-08-27 DIAGNOSIS — K7689 Other specified diseases of liver: Secondary | ICD-10-CM

## 2011-08-27 DIAGNOSIS — K7460 Unspecified cirrhosis of liver: Secondary | ICD-10-CM

## 2011-08-27 DIAGNOSIS — K5289 Other specified noninfective gastroenteritis and colitis: Secondary | ICD-10-CM

## 2011-08-27 DIAGNOSIS — K760 Fatty (change of) liver, not elsewhere classified: Secondary | ICD-10-CM

## 2011-08-27 LAB — COMPREHENSIVE METABOLIC PANEL
AST: 315 U/L — ABNORMAL HIGH (ref 0–37)
Albumin: 3.7 g/dL (ref 3.5–5.2)
BUN: 8 mg/dL (ref 6–23)
CO2: 21 mEq/L (ref 19–32)
Calcium: 8.4 mg/dL (ref 8.4–10.5)
Chloride: 102 mEq/L (ref 96–112)
Glucose, Bld: 203 mg/dL — ABNORMAL HIGH (ref 70–99)
Potassium: 3.7 mEq/L (ref 3.5–5.3)

## 2011-08-27 LAB — CBC WITH DIFFERENTIAL/PLATELET
Basophils Relative: 0 % (ref 0–1)
HCT: 34.2 % — ABNORMAL LOW (ref 39.0–52.0)
Hemoglobin: 10.4 g/dL — ABNORMAL LOW (ref 13.0–17.0)
Lymphocytes Relative: 19 % (ref 12–46)
Lymphs Abs: 1.9 10*3/uL (ref 0.7–4.0)
Monocytes Absolute: 0.9 10*3/uL (ref 0.1–1.0)
Monocytes Relative: 9 % (ref 3–12)
Neutro Abs: 6.9 10*3/uL (ref 1.7–7.7)
Neutrophils Relative %: 70 % (ref 43–77)
RBC: 4.25 MIL/uL (ref 4.22–5.81)
WBC: 10 10*3/uL (ref 4.0–10.5)

## 2011-08-27 NOTE — Progress Notes (Signed)
Presenting complaint; upper and lower abdominal pain. Patient with known ileal and perianal Crohn's disease. Steatohepatitis with grade 3 inflammation and stage III fibrosis(liver biopsy on 07/31/2011). Subjective; David [name] is 35 year old Caucasian male who was last seen in the office by me on 07/21/2011. He called our office one week ago stating that he was having excruciating upper and lower abdominal pain. He declined to go to emergency room. He was begun on Cipro and Flagyl along with prednisone with the thought that he may be having flared up with his Crohn's disease. I also asked him to stop humira. 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 He also had splenomegaly. I reviewed the results with the patient over the phone and this was it was arranged. He states he is feeling better. He is taking his pain medication every 4-6 hours. His lower abdominal pain is more pronounced when he has a bowel movement. Is having formed to 3 stools daily. He still seen, blood with his bowel movement which comes from the fistula which has not completely healed. He has an appointment to see Dr. Durenda Hurt next month. He also complains of fullness and pain in his upper abdomen he is having low-grade fever always up to her less than 100 F. He denies having any family issues he tells me at lost 10 pounds until he then back on prednisone and he has gained 6 pounds back. He hasn't done much exercise since he got sick this time. He has noted some edema involving both his feet. Current Outpatient Prescriptions on File Prior to Visit  Medication Sig Dispense Refill  . dicyclomine (BENTYL) 20 MG tablet Take 1 tablet (20 mg total) by mouth 3 (three) times daily before meals.  90 tablet  5  . enalapril (VASOTEC) 10 MG tablet Take 10 mg by  mouth daily.        . fish oil-omega-3 fatty acids 1000 MG capsule Take 2 g by mouth daily.        . Multiple Vitamin (MULTIVITAMIN) tablet Take 1 tablet by mouth daily.        . nortriptyline (PAMELOR) 10 MG capsule Take 50 mg by mouth. Patient takes( 2) 50 mg at Bedtime      . oxyCODONE (OXYCONTIN) 10 MG 12 hr tablet Take 20 mg by mouth. Patient takes 20 mg every 4  hours      . pantoprazole (PROTONIX) 40 MG tablet Take 40 mg by mouth daily.        . Probiotic Product (ALIGN) 4 MG CAPS Take by mouth daily.        . promethazine (PHENERGAN) 25 MG tablet Take 25 mg by mouth. As Needed      . HUMIRA PEN 40 MG/0.8ML injection 40 mg every 14 (fourteen) days. Last injection 07/11/11       objective; BP 140/86  Pulse 78  Temp(Src) 98.3 F (36.8 C) (Oral)  Ht 5\' 11"  (1.803 m)  Wt 228 lb (103.42 kg)  BMI 31.80 kg/m2 Patient does not appear to be in any distress. Conjunctiva is pink. Sclerae nonicteric. Oropharyngeal mucosa is normal. No neck masses or thyromegaly noted. Cardiac exam with regular rhythm normal S1 and S2. No murmur or gallop noted. Lungs are clear to auscultation. Abdomen is symmetrical. It is soft with mild tenderness at RLQ without  guarding. Liver is firm she palpable laterally about 4-5 cm below costal margin. Spleen is not palpable. No edema noted. Lab data; I have reviewed patient's current and old CT from January 2012 at Dr. Ulyses Southward. I've also gone over findings with the patient and reviewed both studies with him. His liver does not appear to be fatty as on January study. No abnormality noted in pelvic or perirectal region Assessment; #1. Crohn's disease. It does not appear that his disease has progressed. Therefore I'm not sure why he is having more pain. Patient is off Humira. I believe it was not helping him anymore. He previously has been on Remicade and Cimzia. Therefore very few choices left. #2. Non-alcoholic steatohepatitis with stage III fibrosis. This is  multifactorial but primarily secondary to methotrexate which was discontinued earlier this year. He must lose weight. Recommendations; Daily exercise as discussed. Vitamin C 500 mg  mouth daily daily. Vitamin E 400 units by mouth daily. Start prednisone taper at a rate of 5 mg every week. Will check hepatitis B surface antibody as well as hepatitis A antibody total along with alpha-fetoprotein CBC and chemistry panel. Will also check INR.  Start  Pentasa 1 g every 6 hours by mouth. Office visit in 3 months

## 2011-08-27 NOTE — Patient Instructions (Signed)
Take Vitamin C 500 mg daily by mouth. Vitamin E 400 units daily by mouth. Start tapering prednisone by 5 mg every week. Blood work as planned.

## 2011-08-28 ENCOUNTER — Other Ambulatory Visit (INDEPENDENT_AMBULATORY_CARE_PROVIDER_SITE_OTHER): Payer: Self-pay | Admitting: *Deleted

## 2011-08-28 MED ORDER — MESALAMINE ER 500 MG PO CPCR: 1000.0000 mg | ORAL_CAPSULE | Freq: Four times a day (QID) | ORAL | Status: DC

## 2011-08-28 NOTE — Telephone Encounter (Signed)
The Patient was seen on 08-27-11. After his office visit and reviewing lab work,Dr. Karilyn Cota needed to call in 2 New prescriptions. I called to [facility] the following per Dr. Karilyn Cota : Pentasa 1GM PO QID- 1 Month - 6 refills , Urso 500 mg PO BID-1 Month - 6 refills.  The patient also requested a written prescription for his pain medication:Oxycodone 10 mg. Per Dr. Karilyn Cota the prescription will be as follows: Oxycodone 10 mg- Patient to take 4 a day - #120 - Will NOT be refilled prior to a 31 day. This is to be made clear to the patient. A copy of this prescription is in patient's chart.  Patient called and given instructions .

## 2011-09-02 ENCOUNTER — Telehealth (INDEPENDENT_AMBULATORY_CARE_PROVIDER_SITE_OTHER): Payer: Self-pay | Admitting: *Deleted

## 2011-09-02 DIAGNOSIS — K5090 Crohn's disease, unspecified, without complications: Secondary | ICD-10-CM

## 2011-09-02 DIAGNOSIS — R739 Hyperglycemia, unspecified: Secondary | ICD-10-CM

## 2011-09-02 NOTE — Telephone Encounter (Signed)
Per Dr. Karilyn Cota the patient will need repeat labs in four weeks this on 08-26-11, labs are noted on 09-23-11 and will be faxed in October patient will be sent a letter.

## 2011-09-11 LAB — CBC
HCT: 38.5 — ABNORMAL LOW
MCV: 81.5
Platelets: 171
RBC: 4.72
WBC: 9.1

## 2011-09-11 LAB — ANAEROBIC CULTURE

## 2011-09-11 LAB — BASIC METABOLIC PANEL
BUN: 8
Creatinine, Ser: 0.88
GFR calc Af Amer: >60
GFR calc non Af Amer: >60
Potassium: 3.7

## 2011-09-11 LAB — WOUND CULTURE

## 2011-09-17 ENCOUNTER — Telehealth (INDEPENDENT_AMBULATORY_CARE_PROVIDER_SITE_OTHER): Payer: Self-pay | Admitting: *Deleted

## 2011-09-17 NOTE — Telephone Encounter (Signed)
Letter sent as a reminder for lab work.

## 2011-09-24 ENCOUNTER — Other Ambulatory Visit (INDEPENDENT_AMBULATORY_CARE_PROVIDER_SITE_OTHER): Payer: Self-pay | Admitting: Internal Medicine

## 2011-09-25 ENCOUNTER — Encounter (INDEPENDENT_AMBULATORY_CARE_PROVIDER_SITE_OTHER): Payer: BC Managed Care – PPO | Admitting: Psychiatry

## 2011-09-25 DIAGNOSIS — F331 Major depressive disorder, recurrent, moderate: Secondary | ICD-10-CM

## 2011-09-25 LAB — HEPATIC FUNCTION PANEL
ALT: 82 U/L — ABNORMAL HIGH (ref 0–53)
AST: 47 U/L — ABNORMAL HIGH (ref 0–37)
Alkaline Phosphatase: 83 U/L (ref 39–117)
Bilirubin, Direct: 0.1 mg/dL (ref 0.0–0.3)
Total Bilirubin: 0.3 mg/dL (ref 0.3–1.2)

## 2011-10-13 ENCOUNTER — Ambulatory Visit (INDEPENDENT_AMBULATORY_CARE_PROVIDER_SITE_OTHER): Payer: 59 | Admitting: Internal Medicine

## 2011-10-25 ENCOUNTER — Other Ambulatory Visit (INDEPENDENT_AMBULATORY_CARE_PROVIDER_SITE_OTHER): Payer: Self-pay | Admitting: Internal Medicine

## 2011-10-25 DIAGNOSIS — K5090 Crohn's disease, unspecified, without complications: Secondary | ICD-10-CM

## 2011-10-25 MED ORDER — OXYCODONE HCL 10 MG PO TB12: 10.0000 mg | ORAL_TABLET | Freq: Four times a day (QID) | ORAL | Status: DC | PRN

## 2011-11-10 ENCOUNTER — Telehealth (INDEPENDENT_AMBULATORY_CARE_PROVIDER_SITE_OTHER): Payer: Self-pay | Admitting: *Deleted

## 2011-11-10 NOTE — Telephone Encounter (Signed)
Patient's mother called and said it was very import that she speak with [name] about her son. Please return the call to [phone]. Would not provide any information.

## 2011-11-11 ENCOUNTER — Encounter (HOSPITAL_COMMUNITY): Payer: Self-pay | Admitting: *Deleted

## 2011-11-11 ENCOUNTER — Telehealth (INDEPENDENT_AMBULATORY_CARE_PROVIDER_SITE_OTHER): Payer: Self-pay | Admitting: *Deleted

## 2011-11-11 ENCOUNTER — Emergency Department (HOSPITAL_COMMUNITY)
Admission: EM | Admit: 2011-11-11 | Discharge: 2011-11-11 | Disposition: A | Discharge status: Home / Self Care | Payer: Medicare Other | Attending: Emergency Medicine | Admitting: Emergency Medicine

## 2011-11-11 DIAGNOSIS — F1110 Opioid abuse, uncomplicated: Secondary | ICD-10-CM | POA: Insufficient documentation

## 2011-11-11 DIAGNOSIS — K5090 Crohn's disease, unspecified, without complications: Secondary | ICD-10-CM | POA: Insufficient documentation

## 2011-11-11 DIAGNOSIS — R4589 Other symptoms and signs involving emotional state: Secondary | ICD-10-CM | POA: Insufficient documentation

## 2011-11-11 DIAGNOSIS — F1910 Other psychoactive substance abuse, uncomplicated: Secondary | ICD-10-CM

## 2011-11-11 DIAGNOSIS — F17200 Nicotine dependence, unspecified, uncomplicated: Secondary | ICD-10-CM | POA: Insufficient documentation

## 2011-11-11 DIAGNOSIS — K219 Gastro-esophageal reflux disease without esophagitis: Secondary | ICD-10-CM | POA: Insufficient documentation

## 2011-11-11 LAB — COMPREHENSIVE METABOLIC PANEL
ALT: 46 U/L (ref 0–53)
BUN: 9 mg/dL (ref 6–23)
CO2: 27 mEq/L (ref 19–32)
Calcium: 9.6 mg/dL (ref 8.4–10.5)
Creatinine, Ser: 0.82 mg/dL (ref 0.50–1.35)
GFR calc Af Amer: >90 mL/min (ref >90)
GFR calc non Af Amer: >90 mL/min (ref >90)
Glucose, Bld: 112 mg/dL — ABNORMAL HIGH (ref 70–99)
Sodium: 139 mEq/L (ref 135–145)

## 2011-11-11 LAB — CBC
HCT: 37.4 % — ABNORMAL LOW (ref 39.0–52.0)
Hemoglobin: 11.3 g/dL — ABNORMAL LOW (ref 13.0–17.0)
MCH: 22.5 pg — ABNORMAL LOW (ref 26.0–34.0)
MCV: 74.4 fL — ABNORMAL LOW (ref 78.0–100.0)
RBC: 5.03 MIL/uL (ref 4.22–5.81)
WBC: 9.3 10*3/uL (ref 4.0–10.5)

## 2011-11-11 LAB — DIFFERENTIAL
Eosinophils Relative: 2 % (ref 0–5)
Lymphocytes Relative: 13 % (ref 12–46)
Lymphs Abs: 1.3 10*3/uL (ref 0.7–4.0)
Monocytes Absolute: 0.5 10*3/uL (ref 0.1–1.0)
Monocytes Relative: 6 % (ref 3–12)

## 2011-11-11 LAB — RAPID URINE DRUG SCREEN, HOSP PERFORMED
Barbiturates: NOT DETECTED
Cocaine: NOT DETECTED

## 2011-11-11 LAB — ETHANOL: Alcohol, Ethyl (B): <11 mg/dL (ref 0–11)

## 2011-11-11 MED ORDER — OXYCODONE HCL 20 MG PO TB12: 20.0000 mg | ORAL_TABLET | Freq: Every day | ORAL | Status: DC

## 2011-11-11 NOTE — BH Assessment (Signed)
Assessment Note   David [name] is an 35 y.o. male. David [name] comes to the ED with his wife, with complaints of opiate abuse. He has been taking opiates since his diagnosis of Crohn"s Disease in 2004. He has gradually taken more and more to the point he is abusing them He is taking 20-40 mg q 4 hours. He is not suicidal and is not homicidal. He is not psychotic. He is with drawn and isolates himself   From the family most of the time. He is wanting to stop the opiates. His wife feels that the marriage is in danger because of this. He is unsure if he wants detox or if he wants to go to a pain clinic. Axis I: Major Depression, Recurrent severe Axis II: Deferred Axis III:  Past Medical History  Diagnosis Date  . 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 Crohn's disease with fistula 05/04/2011  . Hepatomegaly 05/04/2011  . Fatty liver 05/04/2011  . GERD (gastroesophageal reflux disease)   . Crohn's disease    Axis IV: other psychosocial or environmental problems, problems related to social environment and problems with primary support group Axis V: 41-50 serious symptoms  Past Medical History:  Past Medical History  Diagnosis Date  . 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 Crohn's disease with fistula 05/04/2011  . Hepatomegaly 05/04/2011  . Fatty liver 05/04/2011  . GERD (gastroesophageal reflux disease)   . Crohn's disease     Past Surgical History  Procedure Date  . Anal examination under anesthesia 02/11/2011    [name]  . Treatment fistula anal 07/03/11    This was a second surgery to repair Anal fistula  . Appendectomy   . Cholecystectomy   . Small intestine surgery   . Stomach surgery     Family History:  Family History  Problem Relation Age of Onset  . Diabetes Mother   . Diabetes Brother   . Healthy Daughter   . Healthy Son     Social History:  reports that he has been smoking Cigarettes.  He has been smoking about .5 packs per day. His smokeless tobacco use includes Snuff. He reports  that he does not drink alcohol or use illicit drugs.  Allergies:  Allergies  Allergen Reactions  . Morphine And Related     Home Medications:  Medications Prior to Admission  Medication Sig Dispense Refill  . dicyclomine (BENTYL) 20 MG tablet Take 20 mg by mouth 2 (two) times daily as needed. Stomach pains       . enalapril (VASOTEC) 10 MG tablet Take 10 mg by mouth daily.        . ferrous sulfate 325 (65 FE) MG tablet Take 325 mg by mouth daily with breakfast.        . fish oil-omega-3 fatty acids 1000 MG capsule Take 2 g by mouth daily.        . mesalamine (PENTASA) 500 MG CR capsule Take 2 capsules (1,000 mg total) by mouth 4 (four) times daily.  240 capsule  11  . pantoprazole (PROTONIX) 40 MG tablet Take 40 mg by mouth daily.        . Probiotic Product (ALIGN) 4 MG CAPS Take by mouth daily.        . promethazine (PHENERGAN) 25 MG tablet Take 25 mg by mouth. Nausea/vomiting      . DISCONTD: dicyclomine (BENTYL) 20 MG tablet Take 1 tablet (20 mg total) by mouth 3 (three) times daily before meals.  90 tablet  5  .  HUMIRA PEN 40 MG/0.8ML injection 40 mg every 14 (fourteen) days. Last injection 07/11/11 Have not picked up from pharmacy yet       No current facility-administered medications on file as of 11/11/2011.    OB/GYN Status:  No LMP for male patient.  General Assessment Data Assessment Number: 3  Living Arrangements: Spouse/significant other Can pt return to current living arrangement?: Yes Admission Status: Voluntary Is patient capable of signing voluntary admission?: Yes Transfer from: Acute Hospital Referral Source: Self/Family/Friend  Risk to self Suicidal Ideation: No Suicidal Intent: No Is patient at risk for suicide?: No Suicidal Plan?: No Access to Means: No What has been your use of drugs/alcohol within the last 12 months?: opiates Other Self Harm Risks: no Triggers for Past Attempts: None known Intentional Self Injurious Behavior: None Factors that  decrease suicide risk: Absense of psychosis Family Suicide History: No Recent stressful life event(s): Turmoil (Comment) (conflict with spouse) Persecutory voices/beliefs?: No Depression: Yes Depression Symptoms: Insomnia;Tearfulness;Isolating;Feeling angry/irritable;Feeling worthless/self pity;Loss of interest in usual pleasures;Guilt Substance abuse history and/or treatment for substance abuse?: Yes Suicide prevention information given to non-admitted patients: Yes  Risk to Others Homicidal Ideation: No Thoughts of Harm to Others: No Current Homicidal Intent: No Current Homicidal Plan: No Access to Homicidal Means: No Identified Victim: none History of harm to others?: No Assessment of Violence: None Noted Violent Behavior Description: na Does patient have access to weapons?: No Criminal Charges Pending?: No Does patient have a court date: No  Mental Status Report Appear/Hygiene: Improved Eye Contact: Fair Motor Activity: Freedom of movement Speech: Logical/coherent Level of Consciousness: Alert;Crying Mood: Depressed Affect: Blunted;Depressed Anxiety Level: Moderate Thought Processes: Coherent;Relevant Judgement: Unimpaired Orientation: Person;Place;Time;Situation Obsessive Compulsive Thoughts/Behaviors: Minimal  Cognitive Functioning Concentration: Normal Memory: Recent Intact;Remote Intact IQ: Average Insight: Good Impulse Control: Poor Appetite: Fair Weight Loss: 0  Weight Gain: 0  Sleep: Decreased Total Hours of Sleep: 5  Vegetative Symptoms: None  Prior Inpatient/Outpatient Therapy Prior Therapy: Outpatient Prior Therapy Dates: 2012 Prior Therapy Facilty/Provider(s): [facility] Reason for Treatment: depression            Values / Beliefs Cultural Requests During Hospitalization: None Spiritual Requests During Hospitalization: None        Additional Information 1:1 In Past 12 Months?: No CIRT Risk: No Elopement Risk:  No Does patient have medical clearance?: Yes     Disposition:  Disposition Disposition of Patient: Outpatient treatment;Referred to Type of outpatient treatment: Adult Patient referred to: Outpatient clinic referral The patient was referred to [facility] to continue follow up ;for depression. He was referred by Dr. Valeria Batman to his primary care physician for further treatment and referral to a pain clinic. On Site Evaluation by:   Reviewed with Physician:     Jearld Pies 11/11/2011 2:46 PM

## 2011-11-11 NOTE — Telephone Encounter (Signed)
I returned the call to Mrs. Lovell Sheehan. She shared with me her concern for her son , David [name] and his family. She states that he has become addicted to pain medications. In fact she states that he has actually ask for and rec'd pain medication from her and her Mother.  I explained to her that I was aware as I had talked with his wife [place] and to [employer] as well as to Dr. Karilyn Cota. Haden has agreed to go to [facility] to rec'v help. I also advised his mother that I truly understood her concern but any further information  Would have to come from [place] or his wife. Mrs. Lovell Sheehan was very appreciative.

## 2011-11-11 NOTE — ED Provider Notes (Signed)
History     CSN: [id number] Arrival date & time: 11/11/2011  9:43 AM   First MD Initiated Contact with Patient 11/11/11 [phone]      Chief Complaint  Patient presents with  . Medical Clearance    (Consider location/radiation/quality/duration/timing/severity/associated sxs/prior treatment) HPI  Past Medical History  Diagnosis Date  . 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 Crohn's disease with fistula 05/04/2011  . Hepatomegaly 05/04/2011  . Fatty liver 05/04/2011  . GERD (gastroesophageal reflux disease)   . Crohn's disease     Past Surgical History  Procedure Date  . Anal examination under anesthesia 02/11/2011    [name]  . Treatment fistula anal 07/03/11    This was a second surgery to repair Anal fistula  . Appendectomy   . Cholecystectomy   . Small intestine surgery   . Stomach surgery     Family History  Problem Relation Age of Onset  . Diabetes Mother   . Diabetes Brother   . Healthy Daughter   . Healthy Son     History  Substance Use Topics  . Smoking status: Current Everyday Smoker -- 0.5 packs/day    Types: Cigarettes  . Smokeless tobacco: Current User    Types: Snuff  . Alcohol Use: No      Review of Systems  Allergies  Morphine and related  Home Medications   Current Outpatient Rx  Name Route Sig Dispense Refill  . DICYCLOMINE HCL 20 MG PO TABS Oral Take 1 tablet (20 mg total) by mouth 3 (three) times daily before meals. 90 tablet 5  . ENALAPRIL MALEATE 10 MG PO TABS Oral Take 10 mg by mouth daily.      Marland Kitchen FERROUS SULFATE 325 (65 FE) MG PO TABS Oral Take 325 mg by mouth daily with breakfast.      . OMEGA-3 FATTY ACIDS 1000 MG PO CAPS Oral Take 2 g by mouth daily.      Marland Kitchen HUMIRA PEN 40 MG/0.8ML [place] KIT  40 mg every 14 (fourteen) days. Last injection 07/11/11    . MESALAMINE 500 MG PO CPCR Oral Take 2 capsules (1,000 mg total) by mouth 4 (four) times daily. 240 capsule 11  . METRONIDAZOLE 500 MG PO TABS  500 mg 2 (two) times daily.     Marland Kitchen ONE-DAILY  MULTI VITAMINS PO TABS Oral Take 1 tablet by mouth daily.      Marland Kitchen NORTRIPTYLINE HCL 10 MG PO CAPS Oral Take 50 mg by mouth. Patient takes( 2) 50 mg at Bedtime    . OXYCODONE HCL 10 MG PO TB12 Oral Take 1 tablet (10 mg total) by mouth 4 (four) times daily as needed for pain. Patient takes 20 mg every 4  hours 240 tablet 0    Take 1 to 2 tablets every six hours as needed for  ...  . PANTOPRAZOLE SODIUM 40 MG PO TBEC Oral Take 40 mg by mouth daily.      Marland Kitchen PREDNISONE 5 MG PO TABS Oral Take 5 mg by mouth. Patient takes 6 tablets daily     . ALIGN 4 MG PO CAPS Oral Take by mouth daily.      Marland Kitchen PROMETHAZINE HCL 25 MG PO TABS Oral Take 25 mg by mouth. As Needed      BP 157/82  Pulse 93  Temp(Src) 98.8 F (37.1 C) (Oral)  Resp 18  Ht 5\' 11"  (1.803 m)  Wt 225 lb (102.059 kg)  BMI 31.38 kg/m2  SpO2 98%  Physical Exam  ED Course  Procedures (including critical care time)   Labs Reviewed  CBC  DIFFERENTIAL  COMPREHENSIVE METABOLIC PANEL  ETHANOL  URINE RAPID DRUG SCREEN (HOSP PERFORMED)   No results found.   No diagnosis found.    MDM  Note already done this is a second note        [name], MD 11/13/11 1346

## 2011-11-11 NOTE — Telephone Encounter (Addendum)
Nicki called the office this morning and states that she is very concerned about Alper. That he is addicted to pain medication. She learned from her mother in law that he had been getting Hydrocodone from her and his Grandmother. Nicki was very tearful and concerned and states that she really did not know that this had gotten out of hand. She feels that he needs to be admitted for detox. He has been distant and staying in bed and has commented to his wife that maybe he should blow his head off his shoulders. He has been out of medication since Monday. On numerous occasion he has been talked to by Dr. Karilyn Cota about his pain medication and how to correctly take it.  I called Dr. Karilyn Cota and made him aware, he agreed that the patient should seek medical attention, if the patient refused to go, his wife should get all medication , and adminster it to him as follows. 10 mg every 6 hours then after 4 days patient may only take 10 mg three times a day. I returned a call to [facility] with Dr. Patty Sermons recommendations . She again stresses that there is no medication(pain) in the home. I spoke with Habib and ask him about going to get help and he stated that he would. Then, I encouraged Nicki to have someone to come to their home and remove all guns,ect that may impose a threat out of the home. Eusevio did arrive at [facility] for an evalution. Please refer to notes for further information. I have spoken with his wife and he is doing better , will see PCP on Friday for a referral to [facility].She also stated that someone had come to their home and removed all guns,ect.

## 2011-11-11 NOTE — ED Notes (Signed)
Pt states he is here today to get help to get off Oxycodone. Pt states he has been taking med for 8 years.

## 2011-11-11 NOTE — ED Provider Notes (Signed)
History  This chart was scribed for David Lennert, MD by Bennett Scrape. This patient was seen in room APA16A/APA16A and the patient's care was started at 9:57AM.  CSN: [id number] Arrival date & time: 11/11/2011  9:43 AM   First MD Initiated Contact with Patient 11/11/11 [phone]      Chief Complaint  Patient presents with  . Medical Clearance     Patient is a 35 y.o. male presenting with drug problem. The history is provided by the patient. No language interpreter was used.  Drug Problem This is a chronic problem. Episode onset: Pt has been on Oxycodone since 2004. The problem occurs hourly (Pt takes 20mg  every four hours). The problem has not changed since onset.Pertinent negatives include no chest pain, no abdominal pain, no headaches and no shortness of breath. The symptoms are aggravated by nothing. The symptoms are relieved by nothing. Treatments tried: Pt has tried to quit by himself 4 or 5 times     David [name] is a 35 y.o. male who presents to the Emergency Department requesting medical assistance for narcotics addiction. Pt reports that he takes two 10 mg tablets of Oxycodone every 4 hours for Chron's disease for since an abdominal surgery in 2004. Pt reports feeling pain in his epigastric region when the pain medication wears off. Pt's wife is requesting that pt been treated as an inpatient due to her distrust of him completing the treatment as an outpatient.  Pt states that his Oxycodone medication is filled by Dr. Karilyn Cota. Pt reports that he has not talked to Dr. Karilyn Cota about his desire to cut down his prescription dosage but that Dr. Karilyn Cota is aware that he is in the ED seeking treatment for narcotics addiction. Pt reports trying to quit 4 or 5 times lasting one week each time before returning to prescribed dosage. Pt's wife reports that pt has been lethargic, irritable, has a lack of empathy, a loss of interest in his family, and a decreased appetite. Pt also has a h/o   hepatomegaly.  Pt's family PCP is Dr. Lubertha South.  Past Medical History  Diagnosis Date  . 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 Crohn's disease with fistula 05/04/2011  . Hepatomegaly 05/04/2011  . Fatty liver 05/04/2011  . GERD (gastroesophageal reflux disease)   . Crohn's disease     Past Surgical History  Procedure Date  . Anal examination under anesthesia 02/11/2011    [name]  . Treatment fistula anal 07/03/11    This was a second surgery to repair Anal fistula  . Appendectomy   . Cholecystectomy   . Small intestine surgery   . Stomach surgery     Family History  Problem Relation Age of Onset  . Diabetes Mother   . Diabetes Brother   . Healthy Daughter   . Healthy Son     History  Substance Use Topics  . Smoking status: Current Everyday Smoker -- 0.5 packs/day    Types: Cigarettes  . Smokeless tobacco: Current User    Types: Snuff  . Alcohol Use: No      Review of Systems  Constitutional: Negative for fatigue.  HENT: Negative for congestion, sinus pressure and ear discharge.   Eyes: Negative for discharge.  Respiratory: Negative for cough and shortness of breath.   Cardiovascular: Negative for chest pain.  Gastrointestinal: Negative for abdominal pain and diarrhea.  Genitourinary: Negative for frequency and hematuria.  Musculoskeletal: Negative for back pain.  Skin: Negative for rash.  Neurological: Negative for  seizures and headaches.  Hematological: Negative.   Psychiatric/Behavioral: Positive for agitation. Negative for hallucinations.    Allergies  Morphine and related  Home Medications   Current Outpatient Rx  Name Route Sig Dispense Refill  . ALBUTEROL SULFATE HFA 108 (90 BASE) MCG/ACT IN AERS Inhalation Inhale 2 puffs into the lungs every 6 (six) hours as needed. breathing     . DICYCLOMINE HCL 20 MG PO TABS Oral Take 20 mg by mouth 2 (two) times daily as needed. Stomach pains     . ENALAPRIL MALEATE 10 MG PO TABS Oral Take 10 mg by mouth  daily.      Marland Kitchen FERROUS SULFATE 325 (65 FE) MG PO TABS Oral Take 325 mg by mouth daily with breakfast.      . OMEGA-3 FATTY ACIDS 1000 MG PO CAPS Oral Take 2 g by mouth daily.      Marland Kitchen MESALAMINE 500 MG PO CPCR Oral Take 2 capsules (1,000 mg total) by mouth 4 (four) times daily. 240 capsule 11  . THERA M PLUS PO TABS Oral Take 1 tablet by mouth daily.      . OXYCODONE HCL 20 MG PO TABS Oral Take 1 tablet by mouth every 6 (six) hours. pain     . PANTOPRAZOLE SODIUM 40 MG PO TBEC Oral Take 40 mg by mouth daily.      Marland Kitchen ALIGN 4 MG PO CAPS Oral Take by mouth daily.      Marland Kitchen PROMETHAZINE HCL 25 MG PO TABS Oral Take 25 mg by mouth. Nausea/vomiting    . URSODIOL 500 MG PO TABS Oral Take 500 mg by mouth 2 (two) times daily.      Marland Kitchen HUMIRA PEN 40 MG/0.8ML [place] KIT  40 mg every 14 (fourteen) days. Last injection 07/11/11 Have not picked up from pharmacy yet    . OXYCODONE HCL 20 MG PO TB12 Oral Take 1 tablet (20 mg total) by mouth 5 (five) times daily. 40 tablet 0    Triage Vitals: BP 157/82  Pulse 93  Temp(Src) 98.8 F (37.1 C) (Oral)  Resp 18  Ht 5\' 11"  (1.803 m)  Wt 225 lb (102.059 kg)  BMI 31.38 kg/m2  SpO2 98%  Physical Exam  Nursing note and vitals reviewed. Constitutional: He appears well-developed and well-nourished. No distress.  HENT:  Head: Normocephalic and atraumatic.  Right Ear: External ear normal.  Left Ear: External ear normal.  Eyes: Conjunctivae are normal. Right eye exhibits no discharge. Left eye exhibits no discharge. No scleral icterus.  Neck: Neck supple. No tracheal deviation present.  Cardiovascular: Normal rate, regular rhythm and intact distal pulses.   Pulmonary/Chest: Effort normal and breath sounds normal. No stridor. No respiratory distress. He has no wheezes. He has no rales.  Abdominal: Soft. Bowel sounds are normal. He exhibits no distension. There is no tenderness. There is no rebound and no guarding.       Large mid-line abdominal scar from previous surgery    Musculoskeletal: He exhibits no edema and no tenderness.  Neurological: He is alert. He has normal strength. No sensory deficit. Cranial nerve deficit:  no gross defecits noted. He exhibits normal muscle tone. He displays no seizure activity. Coordination normal.  Skin: Skin is warm and dry. No rash noted.  Psychiatric: He exhibits a depressed mood.    ED Course  Procedures (including critical care time)  DIAGNOSTIC STUDIES: Oxygen Saturation is 98% on room air, normal by my interpretation.    COORDINATION OF CARE: 10:00AM-Discussed  treatment plan with patient at bedside and patient agreed to plan. 12:10PM-All labs and x-rays reviewed with patient and treatment plan discussed. 1:30PM-Discussed outpatient treatment and tapering dosage medication over a month and patient agreed to plan. Advised against the cold [place] method. Discussed follow-up with PCP and for pt to get a pain specialist. Will prescribe one weeks worth of Oxycodone until pt can see PCP.  Results for orders placed during the hospital encounter of 11/11/11  CBC      Component Value Range   WBC 9.3  4.0 - 10.5 (K/uL)   RBC 5.03  4.22 - 5.81 (MIL/uL)   Hemoglobin 11.3 (*) 13.0 - 17.0 (g/dL)   HCT 14.7 (*) 82.9 - 52.0 (%)   MCV 74.4 (*) 78.0 - 100.0 (fL)   MCH 22.5 (*) 26.0 - 34.0 (pg)   MCHC 30.2  30.0 - 36.0 (g/dL)   RDW 56.2 (*) 13.0 - 15.5 (%)   Platelets 271  150 - 400 (K/uL)  DIFFERENTIAL      Component Value Range   Neutrophils Relative 79 (*) 43 - 77 (%)   Neutro Abs 7.4  1.7 - 7.7 (K/uL)   Lymphocytes Relative 13  12 - 46 (%)   Lymphs Abs 1.3  0.7 - 4.0 (K/uL)   Monocytes Relative 6  3 - 12 (%)   Monocytes Absolute 0.5  0.1 - 1.0 (K/uL)   Eosinophils Relative 2  0 - 5 (%)   Eosinophils Absolute 0.2  0.0 - 0.7 (K/uL)   Basophils Relative 0  0 - 1 (%)   Basophils Absolute 0.0  0.0 - 0.1 (K/uL)  COMPREHENSIVE METABOLIC PANEL      Component Value Range   Sodium 139  135 - 145 (mEq/L)   Potassium 4.3  3.5  - 5.1 (mEq/L)   Chloride 104  96 - 112 (mEq/L)   CO2 27  19 - 32 (mEq/L)   Glucose, Bld 112 (*) 70 - 99 (mg/dL)   BUN 9  6 - 23 (mg/dL)   Creatinine, Ser 8.65  0.50 - 1.35 (mg/dL)   Calcium 9.6  8.4 - 78.4 (mg/dL)   Total Protein 7.6  6.0 - 8.3 (g/dL)   Albumin 3.6  3.5 - 5.2 (g/dL)   AST 35  0 - 37 (U/L)   ALT 46  0 - 53 (U/L)   Alkaline Phosphatase 142 (*) 39 - 117 (U/L)   Total Bilirubin 0.4  0.3 - 1.2 (mg/dL)   GFR calc non Af Amer >90  >90 (mL/min)   GFR calc Af Amer >90  >90 (mL/min)  ETHANOL      Component Value Range   Alcohol, Ethyl (B) <11  0 - 11 (mg/dL)  URINE RAPID DRUG SCREEN (HOSP PERFORMED)      Component Value Range   Opiates NONE DETECTED  NONE DETECTED    Cocaine NONE DETECTED  NONE DETECTED    Benzodiazepines POSITIVE (*) NONE DETECTED    Amphetamines NONE DETECTED  NONE DETECTED    Tetrahydrocannabinol NONE DETECTED  NONE DETECTED    Barbiturates NONE DETECTED  NONE DETECTED    No results found.    1. Substance abuse       MDM  Chronic abd pain   The chart was scribed for me under my direct supervision.  I personally performed the history, physical, and medical decision making and all procedures in the evaluation of this patient.David Lennert, MD 11/11/11 1351

## 2011-11-28 ENCOUNTER — Other Ambulatory Visit (HOSPITAL_COMMUNITY): Payer: Self-pay | Admitting: Psychiatry

## 2011-11-30 ENCOUNTER — Ambulatory Visit (INDEPENDENT_AMBULATORY_CARE_PROVIDER_SITE_OTHER): Payer: Medicare Other | Admitting: Internal Medicine

## 2011-11-30 ENCOUNTER — Encounter (INDEPENDENT_AMBULATORY_CARE_PROVIDER_SITE_OTHER): Payer: Self-pay | Admitting: Internal Medicine

## 2011-11-30 DIAGNOSIS — N200 Calculus of kidney: Secondary | ICD-10-CM | POA: Insufficient documentation

## 2011-11-30 DIAGNOSIS — K7460 Unspecified cirrhosis of liver: Secondary | ICD-10-CM

## 2011-11-30 DIAGNOSIS — I10 Essential (primary) hypertension: Secondary | ICD-10-CM | POA: Insufficient documentation

## 2011-11-30 DIAGNOSIS — K50813 Crohn's disease of both small and large intestine with fistula: Secondary | ICD-10-CM

## 2011-11-30 DIAGNOSIS — K5080 Crohn's disease of both small and large intestine without complications: Secondary | ICD-10-CM

## 2011-11-30 DIAGNOSIS — G8929 Other chronic pain: Secondary | ICD-10-CM | POA: Insufficient documentation

## 2011-11-30 DIAGNOSIS — K219 Gastro-esophageal reflux disease without esophagitis: Secondary | ICD-10-CM

## 2011-11-30 NOTE — Progress Notes (Signed)
Presenting complaint; Followup for Crohn's disease and hepatic fibrosis. 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 His methotrexate was discontinued earlier this year because of a rising alkaline phosphatase and transaminases and hepatomegaly. Was begun on this medication at [facility] about 2 years ago. His viral markers for hepatitis B and C. were negative. He had liver biopsy revealing stage III fibrosis. During this time his Humira was also discontinued. Patient has been on chronic narcotic therapy for abdominal and perianal pain. Pain medication was doubled when he had perianal surgery for fistula is in disease. He had been getting his prescription by me he was taking medication more than he was prescribed and he was also using his mother's medication. He was taken to the emergency room by his wife on 11/11/2011 this problem he was directed to Dr. Lubertha South and is getting pain medication to his office. He states his abdominal pain has increased and it severity primarily in the right lower quadrant. He is having more diarrhea with anywhere from 2-5 stools per day but he hasn't passed any blood lately. He says his fistulae have finally healed. He denies fever chills nausea or vomiting. His heartburn is well controlled with therapy and he hasn't had any pain in his right upper quadrant the last 2-3 months. Current Medications: Current Outpatient Prescriptions  Medication Sig Dispense Refill  . albuterol (PROVENTIL HFA;VENTOLIN HFA) 108 (90 BASE) MCG/ACT inhaler Inhale 2 puffs into the lungs every 6 (six) hours as needed. breathing       . clonazePAM (KLONOPIN) 0.5 MG tablet Take 0.5 mg by mouth 2 (two) times daily as needed.        . dicyclomine (BENTYL) 20 MG tablet Take 20 mg by mouth 2 (two) times daily as needed. Stomach pains       . enalapril (VASOTEC) 10 MG tablet Take 10 mg by mouth  daily.        . ferrous sulfate 325 (65 FE) MG tablet Take 325 mg by mouth daily with breakfast.        . fish oil-omega-3 fatty acids 1000 MG capsule Take 2 g by mouth daily.        . mesalamine (PENTASA) 500 MG CR capsule Take 2 capsules (1,000 mg total) by mouth 4 (four) times daily.  240 capsule  11  . Multiple Vitamins-Minerals (MULTIVITAMINS THER. W/MINERALS) TABS Take 1 tablet by mouth daily.        . Oxycodone HCl 20 MG TABS Take 1 tablet by mouth 4 (four) times daily. pain      . pantoprazole (PROTONIX) 40 MG tablet Take 40 mg by mouth daily.        . Probiotic Product (ALIGN) 4 MG CAPS Take by mouth daily.        . promethazine (PHENERGAN) 25 MG tablet Take 25 mg by mouth. Nausea/vomiting      . ursodiol (ACTIGALL) 500 MG tablet Take 500 mg by mouth 2 (two) times daily.        Marland Kitchen HUMIRA PEN 40 MG/0.8ML injection 40 mg every 14 (fourteen) days. Last injection 07/11/11 Have not picked up from pharmacy yet        Objective: BP 120/70  Pulse 76  Temp(Src) 98.5 F (36.9 C) (Oral)  Resp 14  Ht 5\' 11"  (1.803 m)  Wt 225 lb (102.059 kg)  BMI 31.38 kg/m2  Conjunctiva is pink. Sclera  is nonicteric Oral pharyngeal mucosa is normal. No neck masses or thyromegaly noted. Cardiac exam with regular rhythm normal S1 and S2. No murmur or gallop noted. Lungs are clear to auscultation. Abdomen; bowel sounds are normal. Well-healed scars but soft abdomen with fullness and mild to moderate tenderness in right lower quadrant. Liver edge is firm easily palpable outside midaxillary line. Spleen is not palpable. Rectal examination Limited to external inspection. Scar on the left side anteriorly close to anal orifice but no openings noted.  No LE edema or clubbing noted.  Labs/studies Results: Lab data from 11/11/2011. WBC 9.3, hemoglobin 11.3, hematocrit 37.4 MCV 74.4. Bilirubin oh 0.4 AP 142 AST 35 ALT 46 albumin 3.6.  Assessment: #1. 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 Perianal fistulae have completely healed. He is having symptoms suggestive of ileal disease and needs to get back on Humira. 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 Transaminases are down. #3. Chronic GERD well controlled with therapy. #4. Anemia. #5. 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 He is getting his pain medication to Dr. Lubertha South to office and will be going to Dr. [name].   Plan: New prescription faxed for Humira which he will start as soon as he receives his medication. Office visit in 2 months. He will have CBC and LFTs prior to that visit.

## 2011-11-30 NOTE — Patient Instructions (Addendum)
Call office if you do not receive an by Friday. CBC and LFTs prior to next office visit.

## 2011-12-03 ENCOUNTER — Telehealth (INDEPENDENT_AMBULATORY_CARE_PROVIDER_SITE_OTHER): Payer: Self-pay | Admitting: *Deleted

## 2011-12-03 DIAGNOSIS — K7460 Unspecified cirrhosis of liver: Secondary | ICD-10-CM

## 2011-12-03 DIAGNOSIS — K5090 Crohn's disease, unspecified, without complications: Secondary | ICD-10-CM

## 2011-12-03 NOTE — Telephone Encounter (Signed)
Per Dr. Karilyn Cota at the time of patient's office visit, he will need cbc/d and lft in 2 months

## 2011-12-18 ENCOUNTER — Encounter (HOSPITAL_COMMUNITY): Payer: BC Managed Care – PPO | Admitting: Psychiatry

## 2011-12-18 ENCOUNTER — Ambulatory Visit (HOSPITAL_COMMUNITY): Payer: Self-pay | Admitting: Psychiatry

## 2012-01-08 ENCOUNTER — Encounter (INDEPENDENT_AMBULATORY_CARE_PROVIDER_SITE_OTHER): Payer: Self-pay | Admitting: *Deleted

## 2012-01-20 ENCOUNTER — Encounter (INDEPENDENT_AMBULATORY_CARE_PROVIDER_SITE_OTHER): Payer: Self-pay | Admitting: *Deleted

## 2012-01-21 ENCOUNTER — Telehealth (INDEPENDENT_AMBULATORY_CARE_PROVIDER_SITE_OTHER): Payer: Self-pay | Admitting: *Deleted

## 2012-01-21 NOTE — Telephone Encounter (Addendum)
Mica called our office on 01/20/12. He states that he tried the Humira another 2 weeks as Dr. Karilyn Cota had instructed him to do. He states that it is not helping ,questions about going back on the Remicade. He also states that every time he eats he is getting nauseated. He is requesting a refill on his Phenergan/[facility]. To be addressed by Dr. Karilyn Cota. Patient will be contacted. Per Dr. Karilyn Cota may call in Phenergan 25 mg take 1 by mouth not to exceed 2 per day- #20 no refills , Prednisone 30 mg Take 30 mg by mouth daily until feeling better then start to tapper- 10 mg to be dispensed #100 - Both prescriptions were called to [facility]. Dr. Karilyn Cota states that if the patient has been on Humira for 6 weeks , he would like for him to try 1 more round of the Humira, if there has been no side effects.[name] was called and made aware, he will let me know how many weeks he has taken the medication when he gets home. Patient also aware that medications have been called in.

## 2012-02-05 ENCOUNTER — Other Ambulatory Visit (INDEPENDENT_AMBULATORY_CARE_PROVIDER_SITE_OTHER): Payer: Self-pay | Admitting: Internal Medicine

## 2012-02-06 LAB — HEPATIC FUNCTION PANEL
ALT: 106 U/L — ABNORMAL HIGH (ref 0–53)
AST: 75 U/L — ABNORMAL HIGH (ref 0–37)
Bilirubin, Direct: 0.1 mg/dL (ref 0.0–0.3)
Indirect Bilirubin: 0.3 mg/dL (ref 0.0–0.9)

## 2012-02-06 LAB — CBC WITH DIFFERENTIAL/PLATELET
Basophils Relative: 0 % (ref 0–1)
HCT: 32.3 % — ABNORMAL LOW (ref 39.0–52.0)
Hemoglobin: 9.7 g/dL — ABNORMAL LOW (ref 13.0–17.0)
Lymphocytes Relative: 16 % (ref 12–46)
MCHC: 30 g/dL (ref 30.0–36.0)
MCV: 71.9 fL — ABNORMAL LOW (ref 78.0–100.0)
Monocytes Absolute: 0.8 10*3/uL (ref 0.1–1.0)
Monocytes Relative: 7 % (ref 3–12)
Neutro Abs: 8.7 10*3/uL — ABNORMAL HIGH (ref 1.7–7.7)

## 2012-02-09 ENCOUNTER — Encounter (INDEPENDENT_AMBULATORY_CARE_PROVIDER_SITE_OTHER): Payer: Self-pay | Admitting: Internal Medicine

## 2012-02-09 ENCOUNTER — Ambulatory Visit (INDEPENDENT_AMBULATORY_CARE_PROVIDER_SITE_OTHER): Payer: Medicare Other | Admitting: Internal Medicine

## 2012-02-09 DIAGNOSIS — K5090 Crohn's disease, unspecified, without complications: Secondary | ICD-10-CM

## 2012-02-09 DIAGNOSIS — D649 Anemia, unspecified: Secondary | ICD-10-CM

## 2012-02-09 DIAGNOSIS — D509 Iron deficiency anemia, unspecified: Secondary | ICD-10-CM | POA: Insufficient documentation

## 2012-02-09 MED ORDER — OMEGA-3 FATTY ACIDS 1000 MG PO CAPS: 3.0000 g | ORAL_CAPSULE | Freq: Every day | ORAL | Status: DC

## 2012-02-09 NOTE — Patient Instructions (Signed)
Please have Dr. Gerda Diss to PPD test as soon as possible. Office will call you as soon as her Remicade treatment is approved. CBC and CRP  in 1 month

## 2012-02-09 NOTE — Progress Notes (Signed)
Presenting complaint; Followup for Crohn's disease and liver problems. Subjective: Patient is 36 year old Caucasian male who is here for scheduled visit accompanied by his wife. He continues to complain of pain in right as well as left lower quadrant of his abdomen. He also has been experiencing diarrhea. He was begun on prednisone recently for was felt to be relapse. He had been on him on Humira for over 2 months but did not see any benefit. 4 days ago he had an episode where he passed large amount of bright red blood per rectum. He thought about going to emergency room bleeding stopped spontaneously and he did not go. His appetite is improved since he's been on prednisone and he states he has gained 10 pounds. He continues to experience pain at right upper quadrant but this is occasional I like before. He is having 3-4 bowel movements per day and these are soft to loose. He is seeing Dr. [name] for pain management. He also complains of having no allergy and his wife is also concerned about loss of libido. Patient is interested in going back to Remicade which seemed to have helped him the most. Current Medications: Current Outpatient Prescriptions  Medication Sig Dispense Refill  . albuterol (PROVENTIL HFA;VENTOLIN HFA) 108 (90 BASE) MCG/ACT inhaler Inhale 2 puffs into the lungs every 6 (six) hours as needed. breathing       . clonazePAM (KLONOPIN) 0.5 MG tablet Take 0.5 mg by mouth 2 (two) times daily as needed.        . dicyclomine (BENTYL) 20 MG tablet Take 20 mg by mouth 2 (two) times daily as needed. Stomach pains       . enalapril (VASOTEC) 10 MG tablet Take 10 mg by mouth daily.        . ferrous sulfate 325 (65 FE) MG tablet Take 325 mg by mouth 2 (two) times daily.       . fish oil-omega-3 fatty acids 1000 MG capsule Take 3 capsules (3 g total) by mouth daily.  90 capsule  5  . mesalamine (PENTASA) 500 MG CR capsule Take 2 capsules (1,000 mg total) by mouth 4 (four) times daily.  240 capsule   11  . Multiple Vitamins-Minerals (MULTIVITAMINS THER. W/MINERALS) TABS Take 1 tablet by mouth daily.        . Oxycodone HCl 20 MG TABS Take 10 mg by mouth 3 (three) times daily. pain      . oxymorphone (OPANA) 10 MG tablet Take 10 mg by mouth 2 (two) times daily.      . pantoprazole (PROTONIX) 40 MG tablet Take 40 mg by mouth daily.        . predniSONE (DELTASONE) 10 MG tablet 30 mg daily.       . Probiotic Product (ALIGN) 4 MG CAPS Take by mouth daily.        . promethazine (PHENERGAN) 25 MG tablet Take 25 mg by mouth. Nausea/vomiting      . ursodiol (ACTIGALL) 500 MG tablet Take 500 mg by mouth 2 (two) times daily.          Objective: Blood pressure 140/80, pulse 82, temperature 97.8 F (36.6 C), temperature source Oral, resp. rate 14, height 5\' 11"  (1.803 m), weight 223 lb 1.6 oz (101.197 kg). Patient does not appear to be in any distress. Conjunctiva is pink. Sclera is nonicteric Oropharyngeal mucosa is normal. No neck masses or thyromegaly noted. Cardiac exam with regular rhythm normal S1 and S2. No murmur or gallop noted.  Lungs are clear to auscultation. Abdomen is symmetrical. Bowel sounds are normal. Abdomen is soft with mild tenderness at LLQ and mild to moderate tenderness with fullness at RLQ; liver edge is firm and mildly tender. Spleen is not palpable. He has hepatomegaly.  No LE edema or clubbing noted.  Labs/studies Results: WBC 11.7, hemoglobin 9.7, hematocrit 32.3, MCV 71.9, platelet count 205K. AST 75 and ALT is 106. These are 35 and 46 respectively 3 months ago. Albumin is 3.9   Assessment: #1. Ileocolonic Crohn's disease. Patient is back on prednisone and Pentasa for relapse. We do not have many choices in this case. Methotrexate was discontinued one year ago because of elevated transaminases. 6-MP hasn't worked. He has been on Humira and Cimzia in the past. #2. Steatohepatitis with fibrosis. Liver biopsy in August 2012 revealing grade 3/stage III disease. His  transaminases have gone up most likely secondary to steroids inducing elevated glucose levels. His hemoglobin A1c last November was 7.1. #3. Chronic pain with dependence on pain medication. Currently being managed by Dr. [name]. #4. Iron deficiency anemia.   Plan: Increase ferrous sulfate to 325 mg twice a day. Request Remicade for induction and maintenance. Initial dose would be 5 mg/kg. He will get PPD done at Dr. Fletcher Anon office. I will talk to Dr. Lubertha South if he would benefit from antidepressant therapy. CBC, CRP and hemoglobin A1c in one month. Patient advised to consider vocational therapy and look at options available to him at [facility]. Office visit in 8 weeks

## 2012-02-10 ENCOUNTER — Telehealth (INDEPENDENT_AMBULATORY_CARE_PROVIDER_SITE_OTHER): Payer: Self-pay | Admitting: *Deleted

## 2012-02-10 DIAGNOSIS — K5090 Crohn's disease, unspecified, without complications: Secondary | ICD-10-CM

## 2012-02-10 DIAGNOSIS — R739 Hyperglycemia, unspecified: Secondary | ICD-10-CM

## 2012-02-10 DIAGNOSIS — D649 Anemia, unspecified: Secondary | ICD-10-CM

## 2012-02-10 NOTE — Telephone Encounter (Signed)
Per Dr. Karilyn Cota at the time of the patient's OV 02-09-12, per Dr. Karilyn Cota the patient will need CBC/D, CRP, HgbA1C in 4 weeks. Lab noted for March 27 th .

## 2012-02-11 ENCOUNTER — Encounter (INDEPENDENT_AMBULATORY_CARE_PROVIDER_SITE_OTHER): Payer: Self-pay | Admitting: *Deleted

## 2012-03-09 ENCOUNTER — Telehealth (INDEPENDENT_AMBULATORY_CARE_PROVIDER_SITE_OTHER): Payer: Self-pay | Admitting: *Deleted

## 2012-03-09 NOTE — Telephone Encounter (Signed)
On 02-08-12 David [name] left a message asking that I call him , he had a question to run by me. I was unable to respond on this day. On 03-08-12 David [name] called me again , and ask that I call him. I called and he explained the following to me. In February in saw that Nurse Practioner/PA , at this visit he explained to her that he had run out of the Oxycodone early and told her that David [name] had written this as he could take it every 8-12 hours for break through pain. She told David [name] that this was okay and that when he talked with David [name] at his next visit to explain this to him.  David [name] called on several occasions starting on 03-03-12 / 03-07-12 , leaving messages for a call back and explaining why. He finally spoke with the receptionist on 03-07-12 or 03-08-12 and he explained to her and per David [name] she was rude and stated that David [name] had not told him this , and that the Nurse Practioner was no longer there and she had not made any notes. David [name] 's question was and he understood if David [name] could not , but he wondered if David [name] would or could write for his medication until he could get in with another Pain Management Physician. David [name] had an appointment today (03-09-12) with David [name]  But was not wanting to go , per David [name] because of how he had been treated. I did talk with David [name] and he explained to me that he would not do this as [name] had signed a contract with David [name] and that he could not and would not write for Pain Medication. He felt that David [name] should see his PCP David [name] and explain all of this to him as he  was the referring Physician to Pain Management.  I called David [name] back and told him and also encouraged him to keep his appointment with David [name] on 03-09-12 . I also offered to call David [name] office to see if I could help get him in to see David [name], he said that he would call. David [name] is on the following : Opana 10 mg take 1 by mouth twice a  day , last filled 02/11/12 per [name] at [facility] The Oxycodone 10 mg take 1 by mouth every 8-12 hours for breakthrough pain #75 last filled 02/09/12 , also per [name] at [facility].

## 2012-03-17 ENCOUNTER — Telehealth (INDEPENDENT_AMBULATORY_CARE_PROVIDER_SITE_OTHER): Payer: Self-pay | Admitting: *Deleted

## 2012-03-17 NOTE — Telephone Encounter (Signed)
David [name] called and states that he has not heard from the hospital about the Remicade Infusion . He has not heard from anyone. On March 14 th I faxed the Remicade request to them , when I called today I was told they had not rec'd anything on patient, I refaxed the request and I was told that they would call the patient tomorrow with appointment,  David [name] was made aware and I ask that he call and let us know what he hears.

## 2012-03-18 NOTE — Telephone Encounter (Signed)
Please make sure he gets treatment next week.

## 2012-03-21 ENCOUNTER — Encounter (HOSPITAL_COMMUNITY): Payer: Medicare Other | Attending: Internal Medicine

## 2012-03-21 VITALS — BP 130/85 | HR 89 | Temp 98.5°F | Ht 71.0 in | Wt 212.0 lb

## 2012-03-21 DIAGNOSIS — D509 Iron deficiency anemia, unspecified: Secondary | ICD-10-CM | POA: Insufficient documentation

## 2012-03-21 DIAGNOSIS — K5090 Crohn's disease, unspecified, without complications: Secondary | ICD-10-CM | POA: Insufficient documentation

## 2012-03-21 MED ORDER — SODIUM CHLORIDE 0.9 % IV SOLN
Freq: Once | INTRAVENOUS | Status: AC
  Administered 2012-03-21: 10:00:00 via INTRAVENOUS

## 2012-03-21 MED ORDER — SODIUM CHLORIDE 0.9 % IJ SOLN
10.0000 mL | Freq: Once | INTRAMUSCULAR | Status: AC
  Administered 2012-03-21: 10 mL via INTRAVENOUS

## 2012-03-21 MED ORDER — SODIUM CHLORIDE 0.9 % IV SOLN
5.0000 mg/kg | Freq: Once | INTRAVENOUS | Status: AC
  Administered 2012-03-21: 500 mg via INTRAVENOUS
  Filled 2012-03-21: qty 50

## 2012-03-21 NOTE — Progress Notes (Signed)
Pt arrived for remicade infusion. Pt took tylenol 650 and zyrtec 10 mg at 915 am 1307 tolerated infusion well.

## 2012-03-22 NOTE — Telephone Encounter (Signed)
Dr.Rehman they have posted him for 04/04/12 @ 9:15 am

## 2012-03-22 NOTE — Telephone Encounter (Signed)
Thanks for letting me know the date of Remicade infusion

## 2012-03-30 ENCOUNTER — Other Ambulatory Visit (INDEPENDENT_AMBULATORY_CARE_PROVIDER_SITE_OTHER): Payer: Self-pay | Admitting: *Deleted

## 2012-03-30 NOTE — Telephone Encounter (Signed)
Patient states that he is in need of a refill on his Phenergan. He is having a lot of Nausea. Per Dr. Karilyn Cota may call in the following : Phenergan 25 mg tablet- may take 1 by mouth twice a day-#20-no refills/ called to [facility]. Patient was made aware.

## 2012-04-04 ENCOUNTER — Encounter (HOSPITAL_COMMUNITY): Payer: Medicare Other | Attending: Internal Medicine

## 2012-04-04 DIAGNOSIS — K5090 Crohn's disease, unspecified, without complications: Secondary | ICD-10-CM | POA: Insufficient documentation

## 2012-04-04 DIAGNOSIS — D509 Iron deficiency anemia, unspecified: Secondary | ICD-10-CM | POA: Insufficient documentation

## 2012-04-05 ENCOUNTER — Encounter (HOSPITAL_COMMUNITY): Payer: Self-pay

## 2012-04-05 ENCOUNTER — Encounter (HOSPITAL_COMMUNITY): Payer: Medicare Other

## 2012-04-05 ENCOUNTER — Encounter (INDEPENDENT_AMBULATORY_CARE_PROVIDER_SITE_OTHER): Payer: Self-pay | Admitting: Internal Medicine

## 2012-04-05 ENCOUNTER — Ambulatory Visit (INDEPENDENT_AMBULATORY_CARE_PROVIDER_SITE_OTHER): Payer: Medicare Other | Admitting: Internal Medicine

## 2012-04-05 VITALS — BP 118/70 | HR 83 | Temp 98.3°F | Wt 222.8 lb

## 2012-04-05 VITALS — BP 122/80 | HR 84 | Temp 99.0°F | Resp 20 | Ht 71.0 in | Wt 223.0 lb

## 2012-04-05 DIAGNOSIS — K50813 Crohn's disease of both small and large intestine with fistula: Secondary | ICD-10-CM

## 2012-04-05 DIAGNOSIS — K5080 Crohn's disease of both small and large intestine without complications: Secondary | ICD-10-CM

## 2012-04-05 DIAGNOSIS — R109 Unspecified abdominal pain: Secondary | ICD-10-CM

## 2012-04-05 MED ORDER — SODIUM CHLORIDE 0.9 % IJ SOLN: 10.0000 mL | Freq: Once | INTRAMUSCULAR | Status: DC

## 2012-04-05 MED ORDER — SODIUM CHLORIDE 0.9 % IV SOLN
Freq: Once | INTRAVENOUS | Status: AC
  Administered 2012-04-05: 14:00:00 via INTRAVENOUS

## 2012-04-05 MED ORDER — SODIUM CHLORIDE 0.9 % IV SOLN
5.0000 mg/kg | Freq: Once | INTRAVENOUS | Status: AC
  Administered 2012-04-05: 500 mg via INTRAVENOUS
  Filled 2012-04-05: qty 50

## 2012-04-05 NOTE — Progress Notes (Signed)
Tolerated well

## 2012-04-05 NOTE — Progress Notes (Signed)
Presenting complaint;  Followup for Crohn's disease.  Subjective:  David [name] is 36 year old Caucasian male who was at her scheduled visit. He was last seen on 02/09/2012. He had first dose of Remicade infusion 2 weeks ago and due for her second dose today. He did not experience any side effects after the first dose and states it helped some. He does not feel well. He continues to complain of lower abdominal pain. He is getting pain medication through Dr. Ronal Fear office. He states his pain is not controlled to his satisfaction. He states he has been in bed for the last 3 weeks. He's been running fever. 3 days ago he had a temp of 103 but did not call or go to emergency room. He is having 3-4 bowel movements per day. Stools are loose watery exam off all meds. He has scant amount of blood with his bowel movements no more than once or twice a week. Appetite is poor but he has not lost any weight. He feels depressed but does not want to take medication because it affects his libido. He does not take any NSAIDs.  Current Medications: Current Outpatient Prescriptions  Medication Sig Dispense Refill  . albuterol (PROVENTIL HFA;VENTOLIN HFA) 108 (90 BASE) MCG/ACT inhaler Inhale 2 puffs into the lungs every 6 (six) hours as needed. breathing       . clonazePAM (KLONOPIN) 0.5 MG tablet Take 0.5 mg by mouth 2 (two) times daily as needed.        . dicyclomine (BENTYL) 20 MG tablet Take 20 mg by mouth 2 (two) times daily as needed. Stomach pains       . enalapril (VASOTEC) 10 MG tablet Take 10 mg by mouth daily.        . ferrous sulfate 325 (65 FE) MG tablet Take 325 mg by mouth 2 (two) times daily.       . fish oil-omega-3 fatty acids 1000 MG capsule Take 3 capsules (3 g total) by mouth daily.  90 capsule  5  . InFLIXimab (REMICADE IV) Inject 5 mg into the vein. Patient is getting 5 mg/kg . First infusion was 2 weeks ago, patient going today for another infusion.      . mesalamine (PENTASA) 500 MG CR capsule  Take 2 capsules (1,000 mg total) by mouth 4 (four) times daily.  240 capsule  11  . Multiple Vitamins-Minerals (MULTIVITAMINS THER. W/MINERALS) TABS Take 1 tablet by mouth daily.        . Oxycodone HCl 20 MG TABS Take 10 mg by mouth 3 (three) times daily. pain      . oxymorphone (OPANA) 10 MG tablet Take 10 mg by mouth 2 (two) times daily.      . pantoprazole (PROTONIX) 40 MG tablet Take 40 mg by mouth daily.        . Probiotic Product (ALIGN) 4 MG CAPS Take by mouth daily.        . promethazine (PHENERGAN) 25 MG tablet Take 25 mg by mouth. Nausea/vomiting      . ursodiol (ACTIGALL) 500 MG tablet Take 500 mg by mouth 2 (two) times daily.           Objective: Blood pressure 122/80, pulse 84, temperature 99 F (37.2 C), temperature source Oral, resp. rate 20, height 5\' 11"  (1.803 m), weight 223 lb (101.152 kg). Patient does not appear to be in acute distress. Conjunctiva is pink. Sclera is nonicteric Oropharyngeal mucosa is normal. No neck masses or thyromegaly noted. Cardiac exam with  regular rhythm normal S1 and S2. No murmur or gallop noted. Lungs are clear to auscultation. Abdomen abdomen is symmetrical. Bowel sounds are normal. Abdomen is soft with mild to moderate tenderness in right lower quadrant. He has hepatomegaly with firm liver which is tender with inferior margin 5-6 cm below RCM at end inspiration.  No LE edema or clubbing noted.  Labs/studies Results: Patient did not have blood work as planned.  Assessment:  #1. Ileocolonic and perianal Crohn's disease. He is back on infliximab for induction and then he will be on maintenance. He will need drug level and anti-body level following second dose. #2. Anemia secondary to iron deficiency. #3. Steatohepatitis with fibrosis; grade 3 and stage C. Disease. #4. Chronic GERD. Symptoms well-controlled with therapy. #5. Chronic abdominal pain with narcotic dependence currently managed by Dr. [name]   Plan:  Proceed with  infliximab infusion as scheduled. Go to the  lab for CBC with differential, CRP and hemoglobin A1c. I will contact patient with results. Patient advised to check his temperature twice daily for 2 weeks and call if it's about 101. Serum infliximab and antibiotic levels in 4 weeks. Office visit in 2 months.

## 2012-04-05 NOTE — Patient Instructions (Addendum)
Physician will contact you with results of blood work. Blood work to be done in a.m. Check temperature twice daily for next 2 weeks. Notify if temp above 101 F

## 2012-04-06 ENCOUNTER — Other Ambulatory Visit (INDEPENDENT_AMBULATORY_CARE_PROVIDER_SITE_OTHER): Payer: Self-pay | Admitting: Internal Medicine

## 2012-04-06 LAB — CBC WITH DIFFERENTIAL/PLATELET
Basophils Absolute: 0 10*3/uL (ref 0.0–0.1)
Basophils Relative: 1 % (ref 0–1)
Eosinophils Relative: 3 % (ref 0–5)
HCT: 33.8 % — ABNORMAL LOW (ref 39.0–52.0)
Lymphocytes Relative: 20 % (ref 12–46)
MCHC: 30.2 g/dL (ref 30.0–36.0)
Monocytes Absolute: 0.8 10*3/uL (ref 0.1–1.0)
Neutro Abs: 3.9 10*3/uL (ref 1.7–7.7)
Platelets: 180 10*3/uL (ref 150–400)
RDW: 20.1 % — ABNORMAL HIGH (ref 11.5–15.5)
WBC: 6.1 10*3/uL (ref 4.0–10.5)

## 2012-04-06 LAB — HEMOGLOBIN A1C: Mean Plasma Glucose: 140 mg/dL — ABNORMAL HIGH (ref <117)

## 2012-04-18 ENCOUNTER — Encounter (INDEPENDENT_AMBULATORY_CARE_PROVIDER_SITE_OTHER): Payer: Self-pay

## 2012-05-02 ENCOUNTER — Telehealth (INDEPENDENT_AMBULATORY_CARE_PROVIDER_SITE_OTHER): Payer: Self-pay | Admitting: *Deleted

## 2012-05-02 DIAGNOSIS — K7460 Unspecified cirrhosis of liver: Secondary | ICD-10-CM

## 2012-05-02 DIAGNOSIS — K5010 Crohn's disease of large intestine without complications: Secondary | ICD-10-CM

## 2012-05-02 DIAGNOSIS — K5090 Crohn's disease, unspecified, without complications: Secondary | ICD-10-CM

## 2012-05-02 DIAGNOSIS — D649 Anemia, unspecified: Secondary | ICD-10-CM

## 2012-05-02 DIAGNOSIS — R739 Hyperglycemia, unspecified: Secondary | ICD-10-CM

## 2012-05-02 NOTE — Telephone Encounter (Signed)
Per Dr.Rehman the patient will need the following labs prior to next ov in June.

## 2012-05-02 NOTE — Telephone Encounter (Signed)
Loden left a message this morning that since Friday he has been having Abdominal pain in the mid section and frequent watery stools. He questions if he may need to go back on steroids. Per Dr.Rehman- Patient may take Prednisone 10 mg for 2 weeks then 5 mg for 2 weeks, then call us with a progress report. Medication called to [facility] Patient made aware When I talked with Patient about Prednisone, he had another question. He ask if Liver Problems could affect your mind. He has no remembrace of Friday , not even Dr.rehman calling him, he remembers 1/2 day Saturday , Sunday was foggy and today he is okay. Per Dr.Rehman the patient should have an Ammonia Level drawn. Order faxed to [facility] , and patient made aware.

## 2012-05-03 ENCOUNTER — Other Ambulatory Visit (INDEPENDENT_AMBULATORY_CARE_PROVIDER_SITE_OTHER): Payer: Self-pay | Admitting: Internal Medicine

## 2012-05-13 ENCOUNTER — Encounter (INDEPENDENT_AMBULATORY_CARE_PROVIDER_SITE_OTHER): Payer: Self-pay | Admitting: *Deleted

## 2012-05-13 ENCOUNTER — Other Ambulatory Visit (INDEPENDENT_AMBULATORY_CARE_PROVIDER_SITE_OTHER): Payer: Self-pay | Admitting: *Deleted

## 2012-05-13 DIAGNOSIS — K5090 Crohn's disease, unspecified, without complications: Secondary | ICD-10-CM

## 2012-05-16 ENCOUNTER — Encounter (HOSPITAL_COMMUNITY): Payer: Medicare Other | Attending: Internal Medicine

## 2012-05-16 VITALS — Wt 220.0 lb

## 2012-05-16 DIAGNOSIS — K50813 Crohn's disease of both small and large intestine with fistula: Secondary | ICD-10-CM

## 2012-05-16 DIAGNOSIS — K5080 Crohn's disease of both small and large intestine without complications: Secondary | ICD-10-CM | POA: Insufficient documentation

## 2012-05-16 MED ORDER — SODIUM CHLORIDE 0.9 % IV SOLN
Freq: Once | INTRAVENOUS | Status: AC
  Administered 2012-05-16: 10:00:00 via INTRAVENOUS

## 2012-05-16 MED ORDER — SODIUM CHLORIDE 0.9 % IV SOLN
5.0000 mg/kg | Freq: Once | INTRAVENOUS | Status: AC
  Administered 2012-05-16: 500 mg via INTRAVENOUS
  Filled 2012-05-16: qty 50

## 2012-05-16 NOTE — Progress Notes (Signed)
Pt took tylenol and zyrtec at 8 am. remicade infusion completed without problems

## 2012-06-03 LAB — CBC WITH DIFFERENTIAL/PLATELET
Basophils Absolute: 0 10*3/uL (ref 0.0–0.1)
Eosinophils Absolute: 0.2 10*3/uL (ref 0.0–0.7)
Eosinophils Relative: 3 % (ref 0–5)
HCT: 33.3 % — ABNORMAL LOW (ref 39.0–52.0)
Lymphocytes Relative: 21 % (ref 12–46)
MCH: 22.2 pg — ABNORMAL LOW (ref 26.0–34.0)
MCV: 70.3 fL — ABNORMAL LOW (ref 78.0–100.0)
Monocytes Absolute: 0.7 10*3/uL (ref 0.1–1.0)
RDW: 19 % — ABNORMAL HIGH (ref 11.5–15.5)
WBC: 6.3 10*3/uL (ref 4.0–10.5)

## 2012-06-03 LAB — COMPREHENSIVE METABOLIC PANEL
AST: 147 U/L — ABNORMAL HIGH (ref 0–37)
BUN: 7 mg/dL (ref 6–23)
CO2: 25 mEq/L (ref 19–32)
Calcium: 9.2 mg/dL (ref 8.4–10.5)
Chloride: 103 mEq/L (ref 96–112)
Creat: 0.67 mg/dL (ref 0.50–1.35)

## 2012-06-06 ENCOUNTER — Encounter (INDEPENDENT_AMBULATORY_CARE_PROVIDER_SITE_OTHER): Payer: Self-pay | Admitting: Internal Medicine

## 2012-06-06 ENCOUNTER — Ambulatory Visit (INDEPENDENT_AMBULATORY_CARE_PROVIDER_SITE_OTHER): Payer: Medicare Other | Admitting: Internal Medicine

## 2012-06-06 VITALS — BP 120/74 | HR 78 | Temp 98.1°F | Resp 20 | Ht 71.0 in | Wt 229.1 lb

## 2012-06-06 DIAGNOSIS — K7460 Unspecified cirrhosis of liver: Secondary | ICD-10-CM

## 2012-06-06 DIAGNOSIS — D649 Anemia, unspecified: Secondary | ICD-10-CM

## 2012-06-06 DIAGNOSIS — R7309 Other abnormal glucose: Secondary | ICD-10-CM

## 2012-06-06 DIAGNOSIS — K5090 Crohn's disease, unspecified, without complications: Secondary | ICD-10-CM

## 2012-06-06 NOTE — Patient Instructions (Signed)
Next blood work would be in 3 months prior to office visit

## 2012-06-06 NOTE — Progress Notes (Signed)
Presenting complaint;  Followup for Crohn's disease and elevated transaminases.  Subjective:  David [name] is a 36 year old Caucasian male who is here for scheduled visit. He feels a lot better since his last visit 2 months ago. He has received a total of 4 doses of Remicade infusion. The last one was given on 05/16/2012 and the next one would be due on 07/11/2012. He took last dose of prednisone yesterday. He is having one to 2 formed stools per day. He denies rectal or perirectal discharge. He says fistula has completely healed. He has occasional hematochezia with his bowel movements. He complains of abdominal pain and right low quadrant and right upper quadrant. This pain medication is helping a great deal. Heartburn is well-controlled with therapy. He did not experience any side effects with Remicade. He has gained 6 pounds since his last visit. Now that he is off prednisone he will make an effort to lose at least 20 pounds. He has started to swim at home regularly.  Current Medications: Current Outpatient Prescriptions  Medication Sig Dispense Refill  . albuterol (PROVENTIL HFA;VENTOLIN HFA) 108 (90 BASE) MCG/ACT inhaler Inhale 2 puffs into the lungs every 6 (six) hours as needed. breathing       . clonazePAM (KLONOPIN) 0.5 MG tablet Take 0.5 mg by mouth 2 (two) times daily as needed.        . dicyclomine (BENTYL) 20 MG tablet Take 20 mg by mouth 2 (two) times daily as needed. Stomach pains       . enalapril (VASOTEC) 10 MG tablet Take 10 mg by mouth daily.        . ferrous sulfate 325 (65 FE) MG tablet Take 325 mg by mouth 2 (two) times daily.       . fish oil-omega-3 fatty acids 1000 MG capsule Take 3 capsules (3 g total) by mouth daily.  90 capsule  5  . InFLIXimab (REMICADE IV) Inject 5 mg into the vein. Patient is getting 5 mg/kg . First infusion was 2 weeks ago, patient going today for another infusion.      . mesalamine (PENTASA) 500 MG CR capsule Take 2 capsules (1,000 mg total) by mouth  4 (four) times daily.  240 capsule  11  . Multiple Vitamins-Minerals (MULTIVITAMINS THER. W/MINERALS) TABS Take 1 tablet by mouth daily.        . Oxycodone HCl 20 MG TABS Take 10 mg by mouth 3 (three) times daily. pain      . oxymorphone (OPANA) 10 MG tablet Take 10 mg by mouth 2 (two) times daily.      . pantoprazole (PROTONIX) 40 MG tablet Take 40 mg by mouth daily.        . predniSONE (DELTASONE) 10 MG tablet 5 mg daily.       . Probiotic Product (ALIGN) 4 MG CAPS Take by mouth daily.        . promethazine (PHENERGAN) 25 MG tablet Take 25 mg by mouth. Nausea/vomiting      . ursodiol (ACTIGALL) 500 MG tablet Take 500 mg by mouth 2 (two) times daily.           Objective: Blood pressure 120/74, pulse 78, temperature 98.1 F (36.7 C), temperature source Oral, resp. rate 20, height 5\' 11"  (1.803 m), weight 229 lb 1.6 oz (103.919 kg). Patient is alert and in no acute distress. Conjunctiva is pink. Sclera is nonicteric Oropharyngeal mucosa is normal. No neck masses or thyromegaly noted. Cardiac exam with regular rhythm normal S1 and  S2. No murmur or gallop noted. Lungs are clear to auscultation. Abdomen abdomen is full but thin abdominal wall and scars. All sounds normal. Fullness and mild tenderness noted in the hypogastric region to the right of midline. Tender from the liver with inferior margin 5 cm below RCM. Spleen is not palpable. No LE edema or clubbing noted.  Labs/studies Results: Lab data from 06/03/2012. WBC 6.3, H&H 10.5 and 33.3, MCV 70.3, platelet count 206K. Random glucose 104. Bilirubin oh 0.5, AP 119, AST 147, ALT 137 and albumin 4.0.       Assessment:  #1. Crohn's disease involving small bowel as well as perianal disease. He is back on Remicade and appears to be doing very well. He was able to  taper prednisone without any difficulty. #2. Elevated transaminases. He had liver biopsy in August last year revealing grade 3 and stage III disease secondary to  steatohepatitis. Biochemical markers for hepatitis B, hepatitis C, hemochromatosis, Wilson's disease and alpha-1 antitrypsin deficiency were negative. He must exercise and lose weight in order to reduce inflammation and slow down progression of his disease. #3. Iron deficiency anemia. He should continue iron pill twice daily. #4. GERD. Symptoms well-controlled with therapy. #5. Steroid induced DM. Hemoglobin 2 months ago was 6.5.   Plan:  Patient instructed to swim regularly and watch calorie intake and should try to lose some weight. He will have CBC, comprehensive chemistry panel and hemoglobin A1c in 3 months prior to office visit.

## 2012-06-22 ENCOUNTER — Telehealth (INDEPENDENT_AMBULATORY_CARE_PROVIDER_SITE_OTHER): Payer: Self-pay | Admitting: *Deleted

## 2012-06-22 NOTE — Telephone Encounter (Signed)
David [name] called office today and requested a refill on Phenergan 25 mg -the patient takes 1 by mouth as needed for nausea and vomiting #30 He also says that his Dicyclomine 20 mg- Take 20 mg by mouth twice a daily as needed for stomach pain # 60 needs to be refilled, its not due to be filled until Saturday but [name] is going on vacation very early on Saturday. These may be called to [facility] in [place] [phone]. [name] please advise

## 2012-06-22 NOTE — Telephone Encounter (Signed)
Rx called to [facility] pharmacy.  [facility] does not e-prescribe

## 2012-07-11 ENCOUNTER — Encounter (HOSPITAL_COMMUNITY): Payer: BC Managed Care – PPO | Attending: Internal Medicine

## 2012-07-11 VITALS — BP 126/73 | HR 89 | Temp 98.6°F | Wt 219.2 lb

## 2012-07-11 DIAGNOSIS — K50813 Crohn's disease of both small and large intestine with fistula: Secondary | ICD-10-CM

## 2012-07-11 DIAGNOSIS — K5080 Crohn's disease of both small and large intestine without complications: Secondary | ICD-10-CM | POA: Insufficient documentation

## 2012-07-11 MED ORDER — SODIUM CHLORIDE 0.9 % IV SOLN
5.0000 mg/kg | Freq: Once | INTRAVENOUS | Status: AC
  Administered 2012-07-11: 500 mg via INTRAVENOUS
  Filled 2012-07-11: qty 50

## 2012-07-11 MED ORDER — SODIUM CHLORIDE 0.9 % IJ SOLN
10.0000 mL | INTRAMUSCULAR | Status: DC | PRN
  Administered 2012-07-11: 10 mL via INTRAVENOUS

## 2012-07-11 MED ORDER — SODIUM CHLORIDE 0.9 % IV SOLN
INTRAVENOUS | Status: DC
  Administered 2012-07-11: 10:00:00 via INTRAVENOUS

## 2012-07-11 NOTE — Progress Notes (Signed)
Tolerated Remicade infusion well. 

## 2012-07-20 ENCOUNTER — Telehealth (INDEPENDENT_AMBULATORY_CARE_PROVIDER_SITE_OTHER): Payer: Self-pay | Admitting: *Deleted

## 2012-07-20 NOTE — Telephone Encounter (Signed)
Per Dr.Rehman may call in Dicyclomine 20 mg Take 1 by mouth three (3) times daily #90 with 3 refills this was called to [name] was called and made aware.

## 2012-07-20 NOTE — Telephone Encounter (Signed)
Ozan called the office on 08/06 and 08/07. He states that the dicyclomine 20 mg he is to take 2 a day and he has been receiving 60 a month. Lately he has had to take additional Dicyclomine to help with the bad abdominal cramping. [place] is asking if Dr.Rehman would write the prescription for 3 a day #90.  [name] uses [facility]. He may be reached at [phone]  Dr.Rehman paged

## 2012-07-20 NOTE — Telephone Encounter (Signed)
Already addressed by Ms. Todd. Patient can take dicyclomine 20 mg 3 times a day

## 2012-08-03 ENCOUNTER — Other Ambulatory Visit (INDEPENDENT_AMBULATORY_CARE_PROVIDER_SITE_OTHER): Payer: Self-pay | Admitting: Internal Medicine

## 2012-08-03 DIAGNOSIS — K219 Gastro-esophageal reflux disease without esophagitis: Secondary | ICD-10-CM

## 2012-08-03 MED ORDER — PANTOPRAZOLE SODIUM 40 MG PO TBEC: 40.0000 mg | DELAYED_RELEASE_TABLET | Freq: Every day | ORAL | Status: DC

## 2012-08-22 ENCOUNTER — Other Ambulatory Visit (INDEPENDENT_AMBULATORY_CARE_PROVIDER_SITE_OTHER): Payer: Self-pay | Admitting: Internal Medicine

## 2012-08-22 ENCOUNTER — Telehealth (INDEPENDENT_AMBULATORY_CARE_PROVIDER_SITE_OTHER): Payer: Self-pay | Admitting: *Deleted

## 2012-08-22 DIAGNOSIS — R600 Localized edema: Secondary | ICD-10-CM

## 2012-08-22 MED ORDER — FUROSEMIDE 40 MG PO TABS: 40.0000 mg | ORAL_TABLET | Freq: Every day | ORAL | Status: DC | PRN

## 2012-08-22 MED ORDER — POTASSIUM CHLORIDE ER 10 MEQ PO TBCR: 20.0000 meq | EXTENDED_RELEASE_TABLET | Freq: Every day | ORAL | Status: DC | PRN

## 2012-08-22 NOTE — Telephone Encounter (Signed)
David [name] has left a message stating that he has had swelling in both his feet and ankles for about 1 week and it is getting worse. He ask what should he do about this. The last time he had this is when Dr.Rehman found out he had Liver Disease per the patient. May contact him at [phone].

## 2012-08-22 NOTE — Telephone Encounter (Signed)
Patient's call returned. Prescription for furosemide 40 mg daily when necessary and KCL 20 mEq by mouth daily when necessary sent to his pharmacy. Further changes will be made on his next office visit on 09/06/2012.

## 2012-09-05 ENCOUNTER — Ambulatory Visit (HOSPITAL_COMMUNITY): Payer: Medicare Other

## 2012-09-06 ENCOUNTER — Ambulatory Visit (HOSPITAL_COMMUNITY)
Admission: RE | Admit: 2012-09-06 | Discharge: 2012-09-06 | Disposition: A | Discharge status: Home / Self Care | Payer: Medicare Other | Source: Ambulatory Visit | Attending: Internal Medicine | Admitting: Internal Medicine

## 2012-09-06 ENCOUNTER — Encounter (INDEPENDENT_AMBULATORY_CARE_PROVIDER_SITE_OTHER): Payer: Self-pay | Admitting: Internal Medicine

## 2012-09-06 ENCOUNTER — Other Ambulatory Visit (INDEPENDENT_AMBULATORY_CARE_PROVIDER_SITE_OTHER): Payer: Self-pay | Admitting: Internal Medicine

## 2012-09-06 ENCOUNTER — Ambulatory Visit (INDEPENDENT_AMBULATORY_CARE_PROVIDER_SITE_OTHER): Payer: Medicare Other | Admitting: Internal Medicine

## 2012-09-06 VITALS — BP 110/74 | HR 78 | Temp 98.3°F | Resp 20 | Ht 71.0 in | Wt 239.1 lb

## 2012-09-06 DIAGNOSIS — E8770 Fluid overload, unspecified: Secondary | ICD-10-CM

## 2012-09-06 DIAGNOSIS — E8779 Other fluid overload: Secondary | ICD-10-CM

## 2012-09-06 DIAGNOSIS — R188 Other ascites: Secondary | ICD-10-CM | POA: Insufficient documentation

## 2012-09-06 DIAGNOSIS — K7460 Unspecified cirrhosis of liver: Secondary | ICD-10-CM

## 2012-09-06 LAB — HEMOGLOBIN A1C
Hgb A1c MFr Bld: 6.1 % — ABNORMAL HIGH (ref <5.7)
Mean Plasma Glucose: 128 mg/dL — ABNORMAL HIGH (ref <117)

## 2012-09-06 LAB — CBC
HCT: 27.8 % — ABNORMAL LOW (ref 39.0–52.0)
MCH: 22.4 pg — ABNORMAL LOW (ref 26.0–34.0)
MCHC: 30.9 g/dL (ref 30.0–36.0)
MCV: 72.4 fL — ABNORMAL LOW (ref 78.0–100.0)
RDW: 17.7 % — ABNORMAL HIGH (ref 11.5–15.5)

## 2012-09-06 LAB — COMPREHENSIVE METABOLIC PANEL
AST: 75 U/L — ABNORMAL HIGH (ref 0–37)
Alkaline Phosphatase: 124 U/L — ABNORMAL HIGH (ref 39–117)
BUN: 12 mg/dL (ref 6–23)
Calcium: 8.6 mg/dL (ref 8.4–10.5)
Creat: 0.65 mg/dL (ref 0.50–1.35)

## 2012-09-06 MED ORDER — FUROSEMIDE 40 MG PO TABS: 40.0000 mg | ORAL_TABLET | Freq: Two times a day (BID) | ORAL | Status: DC

## 2012-09-06 MED ORDER — SPIRONOLACTONE 50 MG PO TABS: 50.0000 mg | ORAL_TABLET | Freq: Two times a day (BID) | ORAL | Status: DC

## 2012-09-06 NOTE — Patient Instructions (Signed)
Ultrasound guided abdominal paracenteses to be scheduled. Stop potassium chloride after 3 days. Drop furosemide to 40 mg daily when weight drops to 220 pounds. New medication is Sporonolactone. 50 mg by mouth twice

## 2012-09-06 NOTE — Progress Notes (Signed)
Presenting complaint;  Abdominal swelling and lower extremity edema.  Subjective:  Patient is 36 year old Caucasian male who presents with progressive lower extremity edema and abdominal swelling. He was last seen 3 months ago for followup of his Crohn's disease. He called office 2 weeks ago complaining of lower extremity edema. Prescription for furosemide and KCl was called in. He had good response early on in 2 weeks go he had weight 210 pounds. Since then he has gained 30 pounds. He has had a low-grade fever. He does have difficulty breathing when he lies flat. He remains with intermittent diarrhea. On his best he is on stool and occasionally he may have as many as 10 stools but these are small volume. He denies rectal bleeding or melena he remains with intermittent abdominal pain on the right side to he has not had any nausea or vomiting. He is accompanied by his mother today. His pain medications have been supervised by Dr. [name].  Current Medications: Current Outpatient Prescriptions  Medication Sig Dispense Refill  . albuterol (PROVENTIL HFA;VENTOLIN HFA) 108 (90 BASE) MCG/ACT inhaler Inhale 2 puffs into the lungs every 6 (six) hours as needed. breathing       . clonazePAM (KLONOPIN) 0.5 MG tablet Take 0.5 mg by mouth 2 (two) times daily as needed.        . dicyclomine (BENTYL) 20 MG tablet Take 20 mg by mouth 2 (two) times daily as needed. Stomach pains       . enalapril (VASOTEC) 10 MG tablet Take 10 mg by mouth daily.        . ferrous sulfate 325 (65 FE) MG tablet Take 325 mg by mouth 2 (two) times daily.       . fish oil-omega-3 fatty acids 1000 MG capsule Take 3 capsules (3 g total) by mouth daily.  90 capsule  5  . furosemide (LASIX) 40 MG tablet Take 1 tablet (40 mg total) by mouth daily as needed.  30 tablet  0  . InFLIXimab (REMICADE IV) Inject 5 mg into the vein. Patient is getting 5 mg/kg . First infusion was 2 weeks ago, patient going today for another infusion.      .  Multiple Vitamins-Minerals (MULTIVITAMINS THER. W/MINERALS) TABS Take 1 tablet by mouth daily.        . Oxycodone HCl 20 MG TABS Take 10 mg by mouth 3 (three) times daily. pain      . oxymorphone (OPANA) 10 MG tablet Take 10 mg by mouth 2 (two) times daily.      . pantoprazole (PROTONIX) 40 MG tablet Take 1 tablet (40 mg total) by mouth daily.  30 tablet  4  . potassium chloride (K-DUR) 10 MEQ tablet Take 2 tablets (20 mEq total) by mouth daily as needed.  30 tablet  0  . Probiotic Product (ALIGN) 4 MG CAPS Take by mouth daily.        . promethazine (PHENERGAN) 25 MG tablet Take 25 mg by mouth. Nausea/vomiting      . ursodiol (ACTIGALL) 500 MG tablet Take 500 mg by mouth 2 (two) times daily.        . mesalamine (PENTASA) 500 MG CR capsule Take 2 capsules (1,000 mg total) by mouth 4 (four) times daily.  240 capsule  11     Objective: Blood pressure 110/74, pulse 78, temperature 98.3 F (36.8 C), temperature source Oral, resp. rate 20, height 5\' 11"  (1.803 m), weight 239 lb 1.6 oz (108.455 kg). Patient is alert and  in no acute distress. He does not have asterixis Conjunctiva is pink. Sclera is slightly icteric Oropharyngeal mucosa is normal. No neck masses or thyromegaly noted. Cardiac exam with regular rhythm normal S1 and S2. No murmur or gallop noted. Lungs are clear to auscultation. Abdomen is protuberant. Flanks are dull. No shifting dullness. Liver edge is easily palpable 3-4 cm below RCM it is firm and mildly tender. Spleen is not palpable. He has 2+ pitting edema involving both lower extremities up to the level of knees.  Labs/studies Results: Patient did not have labs as planned prior to this visit.  Assessment:  #1. Fluid overload with lower extremity edema and possible ascites. These symptoms would indicate progression of his liver disease. He had liver biopsy in August last year revealing steatohepatitis with fibrosis. He has stage III disease. Hep C antibody was negative. He  is immune to hepatitis B by virtue of vaccination. Ceruloplasmin alpha-1 antitrypsin were negative. Suspect is fibrosis has progressed. Patient had been on methotrexate which was discontinued early part of last year. #2. Ileal and perianal Crohn's disease.    Plan:  Ultrasound-guided abdominal paracenteses for diagnostic purposes. He would go to the lab for CBC, comprehensive chemistry panel, INR and alpha-fetoprotein. Increase furosemide to 40 mg twice a day. Spironolactone 150 mg by mouth twice a day. Discontinue KCl after 3 doses. Continue low-salt diet. Office visit in 2 weeks. He will also need to be referred to transplant center if fluid overload is secondary to liver disease.

## 2012-09-07 ENCOUNTER — Telehealth (INDEPENDENT_AMBULATORY_CARE_PROVIDER_SITE_OTHER): Payer: Self-pay | Admitting: *Deleted

## 2012-09-07 DIAGNOSIS — D509 Iron deficiency anemia, unspecified: Secondary | ICD-10-CM

## 2012-09-07 NOTE — Telephone Encounter (Signed)
Enter in error

## 2012-09-12 ENCOUNTER — Encounter (HOSPITAL_COMMUNITY): Payer: Medicare Other | Attending: Internal Medicine

## 2012-09-12 VITALS — BP 120/68 | HR 90 | Temp 98.3°F | Resp 16 | Wt 217.0 lb

## 2012-09-12 DIAGNOSIS — K5080 Crohn's disease of both small and large intestine without complications: Secondary | ICD-10-CM | POA: Insufficient documentation

## 2012-09-12 DIAGNOSIS — K50813 Crohn's disease of both small and large intestine with fistula: Secondary | ICD-10-CM

## 2012-09-12 MED ORDER — SODIUM CHLORIDE 0.9 % IV SOLN
INTRAVENOUS | Status: DC
  Administered 2012-09-12: 11:00:00 via INTRAVENOUS

## 2012-09-12 MED ORDER — SODIUM CHLORIDE 0.9 % IV SOLN
5.0000 mg/kg | Freq: Once | INTRAVENOUS | Status: AC
  Administered 2012-09-12: 500 mg via INTRAVENOUS
  Filled 2012-09-12: qty 50

## 2012-09-12 MED ORDER — FERUMOXYTOL INJECTION 510 MG/17 ML
510.0000 mg | Freq: Once | INTRAVENOUS | Status: AC
  Administered 2012-09-12: 510 mg via INTRAVENOUS
  Filled 2012-09-12: qty 17

## 2012-09-12 MED ORDER — SODIUM CHLORIDE 0.9 % IJ SOLN
10.0000 mL | INTRAMUSCULAR | Status: DC | PRN
  Administered 2012-09-12: 10 mL via INTRAVENOUS

## 2012-09-12 NOTE — Progress Notes (Signed)
Tolerated all very well.

## 2012-09-19 ENCOUNTER — Ambulatory Visit (HOSPITAL_COMMUNITY): Payer: Medicare Other

## 2012-09-20 ENCOUNTER — Encounter (INDEPENDENT_AMBULATORY_CARE_PROVIDER_SITE_OTHER): Payer: Self-pay | Admitting: Internal Medicine

## 2012-09-20 ENCOUNTER — Ambulatory Visit (INDEPENDENT_AMBULATORY_CARE_PROVIDER_SITE_OTHER): Payer: Medicare Other | Admitting: Internal Medicine

## 2012-09-20 VITALS — BP 110/70 | HR 78 | Temp 98.0°F | Resp 18 | Ht 71.0 in | Wt 216.0 lb

## 2012-09-20 DIAGNOSIS — E8770 Fluid overload, unspecified: Secondary | ICD-10-CM

## 2012-09-20 DIAGNOSIS — K5090 Crohn's disease, unspecified, without complications: Secondary | ICD-10-CM

## 2012-09-20 DIAGNOSIS — D509 Iron deficiency anemia, unspecified: Secondary | ICD-10-CM

## 2012-09-20 DIAGNOSIS — K7460 Unspecified cirrhosis of liver: Secondary | ICD-10-CM

## 2012-09-20 DIAGNOSIS — E8779 Other fluid overload: Secondary | ICD-10-CM

## 2012-09-20 MED ORDER — FUROSEMIDE 40 MG PO TABS: 40.0000 mg | ORAL_TABLET | Freq: Every day | ORAL | Status: DC

## 2012-09-20 NOTE — Patient Instructions (Signed)
MRI enterography to be scheduled. Drop furosemide to 40 mg by mouth daily. Continue Pentasa 1 g by mouth 3 times a day. Physician will contact you with results of MRI incompleted.

## 2012-09-20 NOTE — Progress Notes (Signed)
Presenting complaint;  Followup for fluid overload. Patient complains of worsening pain across lower abdomen and passed bright red blood per rectum today.  Subjective:  Patient is 36 year old Caucasian male who was seen 2 weeks ago at over 20 pound weight gain lower extremity edema and abdominal distention. He has history of stage III hepatic fibrosis. I was concerned that he may have developed ascites. Ultrasound revealed very little ascites not amenable to tap. He was also noted to have low hemoglobin and iron infusion was requested and he has received first dose of feraheme last week and he will get second dose later this week. He was begun on diuretic therapy. He feels much better. He has lost 23 pounds in the last 2 weeks. Lower extremity edema has resolved. He feels his abdomen is less distended. He continues to have intermittent low-grade fever. Last dose of Remicade infusion was on 09/12/2012. Over the last few days he has noted sharp crampy pain across his lower abdomen and this morning he passed small to moderate amount of blood with his bowel movement. He does not take any NSAIDs.  Current Medications: Current Outpatient Prescriptions  Medication Sig Dispense Refill  . albuterol (PROVENTIL HFA;VENTOLIN HFA) 108 (90 BASE) MCG/ACT inhaler Inhale 2 puffs into the lungs every 6 (six) hours as needed. breathing       . clonazePAM (KLONOPIN) 0.5 MG tablet Take 0.5 mg by mouth 2 (two) times daily as needed.        . dicyclomine (BENTYL) 20 MG tablet Take 20 mg by mouth 2 (two) times daily as needed. Stomach pains       . enalapril (VASOTEC) 10 MG tablet Take 10 mg by mouth daily.        . ferrous sulfate 325 (65 FE) MG tablet Take 325 mg by mouth 2 (two) times daily.       . fish oil-omega-3 fatty acids 1000 MG capsule Take 3 capsules (3 g total) by mouth daily.  90 capsule  5  . furosemide (LASIX) 40 MG tablet Take 1 tablet (40 mg total) by mouth 2 (two) times daily.  60 tablet  2  .  InFLIXimab (REMICADE IV) Inject 5 mg into the vein. Patient is getting 5 mg/kg . First infusion was 2 weeks ago, patient going today for another infusion.      . Multiple Vitamins-Minerals (MULTIVITAMINS THER. W/MINERALS) TABS Take 1 tablet by mouth daily.        . Oxycodone HCl 20 MG TABS Take 10 mg by mouth 3 (three) times daily. pain      . oxymorphone (OPANA) 10 MG tablet Take 10 mg by mouth 2 (two) times daily.      . pantoprazole (PROTONIX) 40 MG tablet Take 1 tablet (40 mg total) by mouth daily.  30 tablet  4  . potassium chloride (K-DUR) 10 MEQ tablet Take 2 tablets (20 mEq total) by mouth daily as needed.  30 tablet  0  . Probiotic Product (ALIGN) 4 MG CAPS Take by mouth daily.        . promethazine (PHENERGAN) 25 MG tablet Take 25 mg by mouth. Nausea/vomiting      . spironolactone (ALDACTONE) 50 MG tablet Take 1 tablet (50 mg total) by mouth 2 (two) times daily.  60 tablet  5  . ursodiol (ACTIGALL) 500 MG tablet Take 500 mg by mouth 2 (two) times daily.        . mesalamine (PENTASA) 500 MG CR capsule Take 2 capsules (  1,000 mg total) by mouth 4 (four) times daily.  240 capsule  11     Objective: Blood pressure 110/70, pulse 78, temperature 98 F (36.7 C), temperature source Oral, resp. rate 18, height 5\' 11"  (1.803 m), weight 216 lb (97.977 kg).  patient is alert and in no acute distress. Conjunctiva is pink. Sclera is nonicteric Oropharyngeal mucosa is normal. No neck masses or thyromegaly noted. Cardiac exam with regular rhythm normal S1 and S2. No murmur or gallop noted. Lungs are clear to auscultation. Abdomen is full. It is soft but easily palpable firm and mildly tender liver. Spleen is not palpable. Fullness noted in right lower quadrant along with mild to moderate tenderness across lower abdomen. No mass palpated.  He has trace edema around his ankles.  Labs/studies Results:  From 09/06/2012. WBC 5.8, H&H 8.6 and 27.8, platelet count 166K.  Electrolytes normal. Glucose  95, BUN 12, creatinine  0.65, bilirubin  0.6, AP 124, AST 75, ALT 4.2 albumin 3.3. HbA1c 6.1. AFP 4.0.  Assessment:  #1. Fluid overload possibly secondary to anemia and hyperkinetic syndrome. When I saw him 2 weeks ago I felt his liver disease may have progressed but this does not appear to be the case. He has responded very nicely to diuretic therapy. #2. Iron deficiency anemia. This appears to be multifactorial. He must have impaired iron absorption secondary to chronic PPI therapy for GERD and also due to chronic blood loss from his GI tract. He is receiving parenteral iron therapy. #3. 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 Possibly related to methotrexate which she took for over two years and he also has fatty liver. #4. Crohn's disease. He is experiencing worsening abdominal pain and had rectal bleeding this a.m. therefore need to rule out relapse. #5. Chronic abdominal pain with dependence on narcotics presently managed by Dr. [name].   Plan:  Decrease furosemide to 40 mg by mouth daily. MR enterography. CBC with differential, CRP and comprehensive chemistry panel in 2 weeks. Patient encouraged to become more active and try to bring his weight down to 200 pounds as it would help with steatohepatitis. Office visit in 2 months.

## 2012-09-21 ENCOUNTER — Encounter (HOSPITAL_COMMUNITY): Payer: Medicare Other | Attending: Internal Medicine

## 2012-09-21 VITALS — BP 107/69 | HR 73 | Temp 98.0°F | Resp 18

## 2012-09-21 DIAGNOSIS — D649 Anemia, unspecified: Secondary | ICD-10-CM | POA: Insufficient documentation

## 2012-09-21 MED ORDER — SODIUM CHLORIDE 0.9 % IJ SOLN
10.0000 mL | Freq: Once | INTRAMUSCULAR | Status: AC
  Administered 2012-09-21: 10 mL via INTRAVENOUS

## 2012-09-21 MED ORDER — SODIUM CHLORIDE 0.9 % IV SOLN: Freq: Once | INTRAVENOUS | Status: DC

## 2012-09-21 MED ORDER — FERUMOXYTOL INJECTION 510 MG/17 ML
510.0000 mg | Freq: Once | INTRAVENOUS | Status: AC
  Administered 2012-09-21: 510 mg via INTRAVENOUS
  Filled 2012-09-21: qty 17

## 2012-09-21 NOTE — Progress Notes (Signed)
Tolerated well

## 2012-09-23 ENCOUNTER — Other Ambulatory Visit (HOSPITAL_COMMUNITY): Payer: Medicare Other

## 2012-09-27 ENCOUNTER — Ambulatory Visit (HOSPITAL_COMMUNITY)
Admission: RE | Admit: 2012-09-27 | Discharge: 2012-09-27 | Disposition: A | Discharge status: Home / Self Care | Payer: Medicare Other | Source: Ambulatory Visit | Attending: Internal Medicine | Admitting: Internal Medicine

## 2012-09-27 ENCOUNTER — Other Ambulatory Visit (HOSPITAL_COMMUNITY): Payer: Medicare Other

## 2012-09-27 DIAGNOSIS — R1032 Left lower quadrant pain: Secondary | ICD-10-CM | POA: Insufficient documentation

## 2012-09-27 DIAGNOSIS — K625 Hemorrhage of anus and rectum: Secondary | ICD-10-CM | POA: Insufficient documentation

## 2012-09-27 DIAGNOSIS — R933 Abnormal findings on diagnostic imaging of other parts of digestive tract: Secondary | ICD-10-CM | POA: Insufficient documentation

## 2012-09-27 DIAGNOSIS — K5090 Crohn's disease, unspecified, without complications: Secondary | ICD-10-CM | POA: Insufficient documentation

## 2012-09-27 MED ORDER — GADOBENATE DIMEGLUMINE 529 MG/ML IV SOLN
19.0000 mL | Freq: Once | INTRAVENOUS | Status: AC | PRN
  Administered 2012-09-27: 19 mL via INTRAVENOUS

## 2012-09-29 ENCOUNTER — Other Ambulatory Visit (INDEPENDENT_AMBULATORY_CARE_PROVIDER_SITE_OTHER): Payer: Self-pay | Admitting: Internal Medicine

## 2012-09-29 DIAGNOSIS — K5090 Crohn's disease, unspecified, without complications: Secondary | ICD-10-CM

## 2012-09-29 MED ORDER — BUDESONIDE 3 MG PO CP24: 3.0000 mg | ORAL_CAPSULE | ORAL | Status: DC

## 2012-10-04 LAB — CBC WITH DIFFERENTIAL/PLATELET
Basophils Absolute: 0 10*3/uL (ref 0.0–0.1)
Basophils Relative: 0 % (ref 0–1)
Eosinophils Absolute: 0.2 10*3/uL (ref 0.0–0.7)
Hemoglobin: 11.5 g/dL — ABNORMAL LOW (ref 13.0–17.0)
MCH: 25.6 pg — ABNORMAL LOW (ref 26.0–34.0)
MCHC: 31.5 g/dL (ref 30.0–36.0)
Monocytes Relative: 9 % (ref 3–12)
Neutro Abs: 4.5 10*3/uL (ref 1.7–7.7)
Neutrophils Relative %: 64 % (ref 43–77)
Platelets: 133 10*3/uL — ABNORMAL LOW (ref 150–400)
RDW: 24.4 % — ABNORMAL HIGH (ref 11.5–15.5)

## 2012-10-05 LAB — COMPREHENSIVE METABOLIC PANEL
AST: 78 U/L — ABNORMAL HIGH (ref 0–37)
Alkaline Phosphatase: 112 U/L (ref 39–117)
Glucose, Bld: 147 mg/dL — ABNORMAL HIGH (ref 70–99)
Sodium: 142 mEq/L (ref 135–145)
Total Bilirubin: 0.5 mg/dL (ref 0.3–1.2)
Total Protein: 6.7 g/dL (ref 6.0–8.3)

## 2012-10-11 ENCOUNTER — Telehealth (INDEPENDENT_AMBULATORY_CARE_PROVIDER_SITE_OTHER): Payer: Self-pay | Admitting: *Deleted

## 2012-10-11 NOTE — Telephone Encounter (Signed)
David [name] called and states that he is having palpations on the Entocort. He is currently on the 3 mg and is to decrease down to 2 mg on theis Thursday. He says it lasted longer last night and he could not sleep. Also ask that I call Nicki with instructions in case he has gone to sleep. [phone] Per Dr.Rheman the patient should decrease down to  2 mg today. Nicki was called and made aware.

## 2012-10-25 ENCOUNTER — Telehealth (INDEPENDENT_AMBULATORY_CARE_PROVIDER_SITE_OTHER): Payer: Self-pay | Admitting: *Deleted

## 2012-10-25 NOTE — Telephone Encounter (Signed)
David [name] left a message stating that he has swelling in legs and feet. He was taking his fluid medication every 3rd day but as of Saturday he started taking it everyday (Lasix 40mg ) He has a temp of 101 Per Dr.Rehman patient may increase Lasix to 80 mg a day until swelling goes down then decrease back to 40 mg daily. He also needs to have an appointment with Dr.Plevy Patient was called and made aware and he says that he forgot to tell us that he is having swelling in his scrotum also. He has been treated for his lymph nodes being swollen and has been on Levaquin. Dr.Rehman made aware and he ask that he take Lasix as noted above and be brought in next week to see him. Patient made aware

## 2012-10-26 NOTE — Telephone Encounter (Signed)
Referral & Notes faxed to [facility], they will contact patient with appt, patient is aware

## 2012-10-26 NOTE — Telephone Encounter (Signed)
Apt has been scheduled for 10/31/12 with Dr. Karilyn Cota.

## 2012-10-29 ENCOUNTER — Emergency Department (HOSPITAL_COMMUNITY): Payer: BC Managed Care – PPO

## 2012-10-29 ENCOUNTER — Encounter (HOSPITAL_COMMUNITY): Payer: Self-pay | Admitting: Emergency Medicine

## 2012-10-29 ENCOUNTER — Other Ambulatory Visit: Payer: Self-pay

## 2012-10-29 ENCOUNTER — Emergency Department (HOSPITAL_COMMUNITY)
Admission: EM | Admit: 2012-10-29 | Discharge: 2012-10-29 | Disposition: A | Discharge status: Home / Self Care | Payer: BC Managed Care – PPO | Attending: Emergency Medicine | Admitting: Emergency Medicine

## 2012-10-29 DIAGNOSIS — Z791 Long term (current) use of non-steroidal anti-inflammatories (NSAID): Secondary | ICD-10-CM | POA: Insufficient documentation

## 2012-10-29 DIAGNOSIS — I313 Pericardial effusion (noninflammatory): Secondary | ICD-10-CM

## 2012-10-29 DIAGNOSIS — F17200 Nicotine dependence, unspecified, uncomplicated: Secondary | ICD-10-CM | POA: Insufficient documentation

## 2012-10-29 DIAGNOSIS — I3139 Other pericardial effusion (noninflammatory): Secondary | ICD-10-CM

## 2012-10-29 DIAGNOSIS — Z8719 Personal history of other diseases of the digestive system: Secondary | ICD-10-CM | POA: Insufficient documentation

## 2012-10-29 DIAGNOSIS — R160 Hepatomegaly, not elsewhere classified: Secondary | ICD-10-CM | POA: Insufficient documentation

## 2012-10-29 DIAGNOSIS — Z79899 Other long term (current) drug therapy: Secondary | ICD-10-CM | POA: Insufficient documentation

## 2012-10-29 DIAGNOSIS — I309 Acute pericarditis, unspecified: Secondary | ICD-10-CM | POA: Insufficient documentation

## 2012-10-29 DIAGNOSIS — I319 Disease of pericardium, unspecified: Secondary | ICD-10-CM

## 2012-10-29 HISTORY — DX: Other pericardial effusion (noninflammatory): I31.39

## 2012-10-29 HISTORY — DX: Pericardial effusion (noninflammatory): I31.3

## 2012-10-29 LAB — COMPREHENSIVE METABOLIC PANEL
Alkaline Phosphatase: 112 U/L (ref 39–117)
BUN: 10 mg/dL (ref 6–23)
CO2: 27 mEq/L (ref 19–32)
Chloride: 97 mEq/L (ref 96–112)
Creatinine, Ser: 0.7 mg/dL (ref 0.50–1.35)
GFR calc non Af Amer: >90 mL/min (ref >90)
Glucose, Bld: 104 mg/dL — ABNORMAL HIGH (ref 70–99)
Total Bilirubin: 0.7 mg/dL (ref 0.3–1.2)

## 2012-10-29 LAB — CBC WITH DIFFERENTIAL/PLATELET
Basophils Relative: 0 % (ref 0–1)
Eosinophils Relative: 3 % (ref 0–5)
HCT: 34.5 % — ABNORMAL LOW (ref 39.0–52.0)
Hemoglobin: 11.3 g/dL — ABNORMAL LOW (ref 13.0–17.0)
Lymphs Abs: 1.1 10*3/uL (ref 0.7–4.0)
MCV: 81.8 fL (ref 78.0–100.0)
Monocytes Relative: 13 % — ABNORMAL HIGH (ref 3–12)
Neutro Abs: 5.9 10*3/uL (ref 1.7–7.7)
RBC: 4.22 MIL/uL (ref 4.22–5.81)
RDW: 23.4 % — ABNORMAL HIGH (ref 11.5–15.5)
WBC: 8.4 10*3/uL (ref 4.0–10.5)

## 2012-10-29 LAB — D-DIMER, QUANTITATIVE: D-Dimer, Quant: 2.8 ug/mL-FEU — ABNORMAL HIGH (ref 0.00–0.48)

## 2012-10-29 MED ORDER — SODIUM CHLORIDE 0.9 % IV SOLN
INTRAVENOUS | Status: DC
  Administered 2012-10-29: 10:00:00 via INTRAVENOUS

## 2012-10-29 MED ORDER — NAPROXEN 500 MG PO TABS: 500.0000 mg | ORAL_TABLET | Freq: Two times a day (BID) | ORAL | Status: DC

## 2012-10-29 MED ORDER — NAPROXEN 250 MG PO TABS
500.0000 mg | ORAL_TABLET | Freq: Once | ORAL | Status: AC
  Administered 2012-10-29: 500 mg via ORAL
  Filled 2012-10-29 (×2): qty 2

## 2012-10-29 MED ORDER — IOHEXOL 350 MG/ML SOLN: 100.0000 mL | Freq: Once | INTRAVENOUS | Status: DC | PRN

## 2012-10-29 NOTE — ED Notes (Signed)
Pt c/o constant left chest pain since Wed. Pt also c/o sob,cough, and more pain when he bends down.

## 2012-10-29 NOTE — ED Notes (Signed)
CRITICAL VALUE ALERT  Critical value received:  Troponin 1.15  Date of notification:  10/29/12  Time of notification: 1038  Critical value read back:yes  Nurse who received alert:  Lilia Pro  MD notified (1st page): Zackowski  Time of first page:  1038  MD notified (2nd page):  Time of second page:  Responding MD:  Deretha Emory  Time MD responded:  1038

## 2012-10-29 NOTE — ED Provider Notes (Addendum)
History   This chart was scribed for Shelda Jakes, MD, by Marcina Millard scribe. The patient was seen in room APA18/APA18 and the patient's care was started at 0741.    CSN: [id number]  Arrival date & time 10/29/12  4098   First MD Initiated Contact with Patient 10/29/12 [phone]      Chief Complaint  Patient presents with  . Chest Pain    (Consider location/radiation/quality/duration/timing/severity/associated sxs/prior treatment) HPI Comments: David [name] is a 36 y.o. male with a h/o of Crohn's disease who presents to the Emergency Department complaining of constant, moderate lateral left-sided chest pain that is aggravated by coughing, laying down, and movement that began 4 days ago. He reports the pain as sharp when he takes a breath and dull in between. He reports associated fluid retention, which he has a h/o and takes entocort. He reports an associated fever of 101, which is higher than his normal baseline temperature of 100. He also reports that he just finished a course of Levaquin yesterday for a recent bacterial infection of the lymph nodes.   PCP is Dr. Gerda Diss.          Past Medical History  Diagnosis Date  . 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 Crohn's disease with fistula 05/04/2011  . Hepatomegaly 05/04/2011  . Fatty liver 05/04/2011  . GERD (gastroesophageal reflux disease)   . Crohn's disease     Past Surgical History  Procedure Date  . Anal examination under anesthesia 02/11/2011    [name]  . Treatment fistula anal 07/03/11    This was a second surgery to repair Anal fistula  . Appendectomy   . Cholecystectomy   . Small intestine surgery   . Stomach surgery     Family History  Problem Relation Age of Onset  . Diabetes Mother   . Diabetes Brother   . Healthy Daughter   . Healthy Son     History  Substance Use Topics  . Smoking status: Current Every Day Smoker -- 0.5 packs/day    Types: Cigarettes  . Smokeless tobacco: Current User   Types: Snuff  . Alcohol Use: No      Review of Systems  HENT: Negative for congestion, sore throat and neck pain.   Gastrointestinal: Negative for diarrhea.  Musculoskeletal: Negative for back pain.  All other systems reviewed and are negative.    Allergies  Morphine and related  Home Medications   Current Outpatient Rx  Name  Route  Sig  Dispense  Refill  . ALBUTEROL SULFATE HFA 108 (90 BASE) MCG/ACT IN AERS   Inhalation   Inhale 2 puffs into the lungs every 6 (six) hours as needed. breathing          . DICYCLOMINE HCL 20 MG PO TABS   Oral   Take 20 mg by mouth 2 (two) times daily as needed. Stomach pains          . ENALAPRIL MALEATE 10 MG PO TABS   Oral   Take 10 mg by mouth daily.           Marland Kitchen FERROUS SULFATE 325 (65 FE) MG PO TABS   Oral   Take 325 mg by mouth 2 (two) times daily.          . OMEGA-3 FATTY ACIDS 1000 MG PO CAPS   Oral   Take 1 g by mouth 3 (three) times daily.         . FUROSEMIDE 40 MG PO TABS  Oral   Take 80 mg by mouth daily.         . IBUPROFEN 200 MG PO TABS   Oral   Take 400 mg by mouth every 6 (six) hours as needed. Fever         . MESALAMINE ER 250 MG PO CPCR   Oral   Take 1,000 mg by mouth 2 (two) times daily.         Carma Leaven M PLUS PO TABS   Oral   Take 1 tablet by mouth daily.           . OXYCODONE HCL 20 MG PO TABS   Oral   Take 10 mg by mouth 3 (three) times daily. pain         . OXYMORPHONE HCL 10 MG PO TABS   Oral   Take 10 mg by mouth 2 (two) times daily.         Marland Kitchen PANTOPRAZOLE SODIUM 40 MG PO TBEC   Oral   Take 1 tablet (40 mg total) by mouth daily.   30 tablet   4     30 minutes before breakfast   . POTASSIUM CHLORIDE ER 10 MEQ PO TBCR   Oral   Take 20 mEq by mouth every other day.         Marland Kitchen ALIGN 4 MG PO CAPS   Oral   Take 4 mg by mouth daily.          Marland Kitchen PROMETHAZINE HCL 25 MG PO TABS   Oral   Take 25 mg by mouth every 6 (six) hours as needed. Nausea/vomiting           . SPIRONOLACTONE 50 MG PO TABS   Oral   Take 1 tablet (50 mg total) by mouth 2 (two) times daily.   60 tablet   5   . URSODIOL 500 MG PO TABS   Oral   Take 500 mg by mouth 2 (two) times daily.           Marland Kitchen REMICADE IV   Intravenous   Inject 5 mg into the vein. Patient is getting 5 mg/kg . First infusion was 2 weeks ago, patient going today for another infusion.         Marland Kitchen NAPROXEN 500 MG PO TABS   Oral   Take 1 tablet (500 mg total) by mouth 2 (two) times daily.   20 tablet   0     BP 118/70  Pulse 99  Temp 99.4 F (37.4 C) (Oral)  Resp 20  Ht 5\' 11"  (1.803 m)  Wt 225 lb (102.059 kg)  BMI 31.38 kg/m2  SpO2 94%  Physical Exam  Constitutional: He is oriented to person, place, and time. He appears well-developed and well-nourished.  Neck: Normal range of motion.  Cardiovascular: Regular rhythm.  Tachycardia present.   Pulmonary/Chest: Effort normal and breath sounds normal.  Abdominal: Bowel sounds are normal. There is no tenderness.  Musculoskeletal: Normal range of motion.       He has trace swelling in both legs, but it is greater on the right.  Lymphadenopathy:    He has no cervical adenopathy.  Neurological: He is alert and oriented to person, place, and time. No cranial nerve deficit. He exhibits normal muscle tone. Coordination normal.  Psychiatric: He has a normal mood and affect. His behavior is normal.    ED Course  Procedures (including critical care time)  DIAGNOSTIC STUDIES: Oxygen Saturation is 94%  on room air, normal by my interpretation.    COORDINATION OF CARE:  07:50- Discussed planned course of treatment with the patient, including a chest X-ray, who is agreeable at this time.  10:42- Recheck- Chest X-ray shows some fluid around the heart and around the lungs. His heart markers are elevated. We are ordering an angio CT. He will need to be transferred to another facility.  11:05- Medication Orders- iohexol (OMNIPAQUE) 350 mg/ml injection  100 mL- Once.  12:27- Consult with cardiology, Dr. Daleen Squibb. His opinion is that all of the issues are pericarditis.  12:36- Recheck- Told the pt that he doesn't need to be admitted. He is being sent home with antiinflammatories. Corinda Gubler will contact him to set up a follow up appointment. He should come back to ED if symptoms get worse.   Results for orders placed during the hospital encounter of 10/29/12  TROPONIN I      Component Value Range   Troponin I 1.15 (*) <0.30 ng/mL  CBC WITH DIFFERENTIAL      Component Value Range   WBC 8.4  4.0 - 10.5 K/uL   RBC 4.22  4.22 - 5.81 MIL/uL   Hemoglobin 11.3 (*) 13.0 - 17.0 g/dL   HCT 16.1 (*) 09.6 - 04.5 %   MCV 81.8  78.0 - 100.0 fL   MCH 26.8  26.0 - 34.0 pg   MCHC 32.8  30.0 - 36.0 g/dL   RDW 40.9 (*) 81.1 - 91.4 %   Platelets 271  150 - 400 K/uL   Neutrophils Relative 71  43 - 77 %   Lymphocytes Relative 13  12 - 46 %   Monocytes Relative 13 (*) 3 - 12 %   Eosinophils Relative 3  0 - 5 %   Basophils Relative 0  0 - 1 %   Neutro Abs 5.9  1.7 - 7.7 K/uL   Lymphs Abs 1.1  0.7 - 4.0 K/uL   Monocytes Absolute 1.1 (*) 0.1 - 1.0 K/uL   Eosinophils Absolute 0.3  0.0 - 0.7 K/uL   Basophils Absolute 0.0  0.0 - 0.1 K/uL   Smear Review MORPHOLOGY UNREMARKABLE    COMPREHENSIVE METABOLIC PANEL      Component Value Range   Sodium 134 (*) 135 - 145 mEq/L   Potassium 4.5  3.5 - 5.1 mEq/L   Chloride 97  96 - 112 mEq/L   CO2 27  19 - 32 mEq/L   Glucose, Bld 104 (*) 70 - 99 mg/dL   BUN 10  6 - 23 mg/dL   Creatinine, Ser 7.82  0.50 - 1.35 mg/dL   Calcium 9.4  8.4 - 95.6 mg/dL   Total Protein 6.9  6.0 - 8.3 g/dL   Albumin 3.1 (*) 3.5 - 5.2 g/dL   AST 46 (*) 0 - 37 U/L   ALT 33  0 - 53 U/L   Alkaline Phosphatase 112  39 - 117 U/L   Total Bilirubin 0.7  0.3 - 1.2 mg/dL   GFR calc non Af Amer >90  >90 mL/min   GFR calc Af Amer >90  >90 mL/min  D-DIMER, QUANTITATIVE      Component Value Range   D-Dimer, Quant 2.80 (*) 0.00 - 0.48 ug/mL-FEU    Labs Reviewed  TROPONIN I - Abnormal; Notable for the following:    Troponin I 1.15 (*)     All other components within normal limits  CBC WITH DIFFERENTIAL - Abnormal; Notable for the following:  Hemoglobin 11.3 (*)     HCT 34.5 (*)     RDW 23.4 (*)     Monocytes Relative 13 (*)     Monocytes Absolute 1.1 (*)     All other components within normal limits  COMPREHENSIVE METABOLIC PANEL - Abnormal; Notable for the following:    Sodium 134 (*)     Glucose, Bld 104 (*)     Albumin 3.1 (*)     AST 46 (*)     All other components within normal limits  D-DIMER, QUANTITATIVE - Abnormal; Notable for the following:    D-Dimer, Quant 2.80 (*)     All other components within normal limits   Dg Chest 2 View  10/29/2012  *RADIOLOGY REPORT*  Clinical Data: Chest pain.  CHEST - 2 VIEW  Comparison: 01/24/2009.  Findings: The cardiopericardial silhouette is enlarged.  The configuration suggests pericardial effusion.  Retrocardiac atelectasis is present.  Small left pleural effusion.  No airspace disease is present.  Scattered areas of atelectasis. Monitoring leads are projected over the chest.  On the frontal view, there is increased density along the right chest wall with smooth margins.  This may represent pleural-based mass or loculated pleural fluid.  This pleural-based mass measures 75 mm x 26 mm.  Prior comparison exam was normal.  IMPRESSION: 1.  Enlargement of the cardiopericardial silhouette with configuration that suggests pericardial effusion.  Consider echocardiography. 2. Small left pleural effusion.  Pleural based opacity right lower chest may represent loculated pleural fluid or pleural mass. Consider follow-up chest CT with infusion for further assessment.   Original Report Authenticated By: Andreas Newport, M.D.    Ct Angio Chest W/cm &/or Wo Cm  10/29/2012  *RADIOLOGY REPORT*  Clinical Data: Chest pain, abnormal chest radiograph  CT ANGIOGRAPHY CHEST  Technique:  Multidetector CT  imaging of the chest using the standard protocol during bolus administration of intravenous contrast. Multiplanar reconstructed images including MIPs were obtained and reviewed to evaluate the vascular anatomy.  Contrast:  100 ml Omnipaque-300 IV  Comparison: Chest radiographs dated 10/29/2012  Findings: No evidence of pulmonary embolism.  Small left pleural effusion.  Associated left lower lobe opacity, likely atelectasis.  No pneumothorax.  Visualized thyroid is unremarkable.  The heart is normal in size.  Moderate to large pericardial effusion.  Suspected faint enhancement of the parietal pericardium (series 4/image 47).  These findings are worrisome for pericarditis.  Small mediastinal lymph nodes measuring up to 10 mm short axis (series 4/image 24).  No suspicious hilar or axillary lymphadenopathy.  Visualized upper abdomen is grossly unremarkable.  Very mild degenerative changes of the lower thoracic spine.  IMPRESSION: No evidence of pulmonary embolism.  Moderate to large pericardial effusion with suspected pericardial enhancement, worrisome for pericarditis.  Correlate for tamponade physiology.  Small left pleural effusion.  Associated left lower lobe atelectasis.   Original Report Authenticated By: Charline Bills, M.D.     Date: 10/29/2012  Rate: 100  Rhythm: normal sinus rhythm  QRS Axis: normal  Intervals: normal  ST/T Wave abnormalities: normal  Conduction Disutrbances:none  Narrative Interpretation:   Old EKG Reviewed: none available    1. Pericarditis     CRITICAL CARE Performed by: Shelda Jakes.   Total critical care time: 30  Critical care time was exclusive of separately billable procedures and treating other patients.  Critical care was necessary to treat or prevent imminent or life-threatening deterioration.  Critical care was time spent personally by me on the following activities: development  of treatment plan with patient and/or surrogate as well as nursing,  discussions with consultants, evaluation of patient's response to treatment, examination of patient, obtaining history from patient or surrogate, ordering and performing treatments and interventions, ordering and review of laboratory studies, ordering and review of radiographic studies, pulse oximetry and re-evaluation of patient's condition.   MDM  Patient presented with left-sided pleuritic-type chest pain shortness of breath and cough. Patient does have significant Crohn's disease. Currently not taking any immunosuppressive but recently was stopped. Workup today reveals pericardial fluid in findings consistent with pericarditis even no EKG had no changes consistent with pericarditis. Elevation in troponin is most likely related to this do not feel that this is the cardiac coronary disease in nature. The chronicity be playing a role with pericarditis. Discussed with Dr. Daleen Squibb from [facility] cardiology he recommends Naprosyn 500 mg twice a day first dose here can be discharged home they'll follow him up in the office on Wednesday. Have ordered an outpatient echocardiogram. Patient has been given precautions of when to return for any shortness of breath feelings in the past hour or worse chest pain. CT chest angiogram ruled out pulmonary embolism.      I personally performed the services described in this documentation, which was scribed in my presence. The recorded information has been reviewed and is accurate.      Shelda Jakes, MD 10/29/12 1258  Shelda Jakes, MD 10/29/12 1259

## 2012-10-29 NOTE — ED Notes (Signed)
Called David [name] (ECHO) to let her know about the Echo ordered by Dr. Deretha Emory.  Pt's number and name left on voice mail.  Nurse also informed Pt to call back if he does not hear from anyone.

## 2012-10-31 ENCOUNTER — Ambulatory Visit (INDEPENDENT_AMBULATORY_CARE_PROVIDER_SITE_OTHER): Payer: Medicare Other | Admitting: Internal Medicine

## 2012-10-31 ENCOUNTER — Encounter (INDEPENDENT_AMBULATORY_CARE_PROVIDER_SITE_OTHER): Payer: Self-pay | Admitting: Internal Medicine

## 2012-10-31 VITALS — BP 110/70 | HR 76 | Temp 98.8°F | Resp 18 | Ht 71.0 in | Wt 221.6 lb

## 2012-10-31 DIAGNOSIS — K5090 Crohn's disease, unspecified, without complications: Secondary | ICD-10-CM

## 2012-10-31 DIAGNOSIS — E8779 Other fluid overload: Secondary | ICD-10-CM

## 2012-10-31 DIAGNOSIS — I319 Disease of pericardium, unspecified: Secondary | ICD-10-CM

## 2012-10-31 DIAGNOSIS — E8770 Fluid overload, unspecified: Secondary | ICD-10-CM

## 2012-10-31 MED ORDER — FUROSEMIDE 40 MG PO TABS: 40.0000 mg | ORAL_TABLET | Freq: Every day | ORAL | Status: DC

## 2012-10-31 MED ORDER — SPIRONOLACTONE 50 MG PO TABS: 50.0000 mg | ORAL_TABLET | Freq: Every day | ORAL | Status: DC

## 2012-10-31 NOTE — Patient Instructions (Addendum)
Will need to stop Naprosyn within 1 week. Continue Entocort taper per schedule Remicade infusion on hold until evaluation for pericarditis completed.

## 2012-10-31 NOTE — Progress Notes (Signed)
Presenting complaint;  Followup for Crohn's disease. Patient states he was diagnosed with pericarditis over the weekend.  Subjective:  Patient is 36 year old Caucasian male with history of  ileal and anorectal Crohn's disease and stage III hepatic fibrosis who is here for scheduled visit. On his last visit of 09/20/2012 he was complaining of lower abdominal pain and bright red blood per rectum. He underwent MRI enterography on 10/03/2012 which showed changes of active Crohn disease involving terminal ileum. He was begun on budesonide 9 mg daily for 2 weeks after which she dropped the dose to 6 mg daily. He will be decreasing dose to 3 mg later this week. Over the last few days he developed shortness of breath and chest pain but apparently when he would lie flat. 2 days ago he was seen in emergency room and found to have large pericardial effusion. He was begun on Naprosyn and has an appointment to see Dr. Juanito Doom tomorrow. He is feeling better as well as his abdominal pain is concerned. He is having 1-2 formed stools per day. He has not passed blood per rectum but only notices blood in the tissue at times his appetite is good. His weight is about 5 pounds since his last visit. Continues having intermittent low-grade fever. His last dose of infliximab was on 09/12/2012 and next dose will be due and of next week. Patient said he was recently treated with Levaquin for lymphadenitis by Dr. Lubertha South.  Current Medications: Current Outpatient Prescriptions  Medication Sig Dispense Refill  . albuterol (PROVENTIL HFA;VENTOLIN HFA) 108 (90 BASE) MCG/ACT inhaler Inhale 2 puffs into the lungs every 6 (six) hours as needed. breathing       . dicyclomine (BENTYL) 20 MG tablet Take 20 mg by mouth 2 (two) times daily as needed. Stomach pains       . enalapril (VASOTEC) 10 MG tablet Take 10 mg by mouth daily.        . ferrous sulfate 325 (65 FE) MG tablet Take 325 mg by mouth 2 (two) times daily.       .  fish oil-omega-3 fatty acids 1000 MG capsule Take 1 g by mouth 3 (three) times daily.      . furosemide (LASIX) 40 MG tablet Take 80 mg by mouth daily.      Marland Kitchen ibuprofen (ADVIL,MOTRIN) 200 MG tablet Take 400 mg by mouth every 6 (six) hours as needed. Fever      . InFLIXimab (REMICADE IV) Inject 5 mg into the vein. Patient is getting 5 mg/kg . First infusion was 2 weeks ago, patient going today for another infusion.      . mesalamine (PENTASA) 250 MG CR capsule Take 1,000 mg by mouth 2 (two) times daily.      . Multiple Vitamins-Minerals (MULTIVITAMINS THER. W/MINERALS) TABS Take 1 tablet by mouth daily.        . naproxen (NAPROSYN) 500 MG tablet Take 1 tablet (500 mg total) by mouth 2 (two) times daily.  20 tablet  0  . Oxycodone HCl 20 MG TABS Take 10 mg by mouth 3 (three) times daily. pain      . oxymorphone (OPANA) 10 MG tablet Take 10 mg by mouth 2 (two) times daily.      . pantoprazole (PROTONIX) 40 MG tablet Take 1 tablet (40 mg total) by mouth daily.  30 tablet  4  . potassium chloride (K-DUR) 10 MEQ tablet Take 20 mEq by mouth every other day.      Marland Kitchen  Probiotic Product (ALIGN) 4 MG CAPS Take 4 mg by mouth daily.       . promethazine (PHENERGAN) 25 MG tablet Take 25 mg by mouth every 6 (six) hours as needed. Nausea/vomiting      . spironolactone (ALDACTONE) 50 MG tablet Take 1 tablet (50 mg total) by mouth 2 (two) times daily.  60 tablet  5  . ursodiol (ACTIGALL) 500 MG tablet Take 500 mg by mouth 2 (two) times daily.         budesonide 6 mg by mouth daily.  Objective: Blood pressure 110/70, pulse 76, temperature 98.8 F (37.1 C), temperature source Oral, resp. rate 18, height 5\' 11"  (1.803 m), weight 221 lb 9.6 oz (100.517 kg). Patient is alert and in no acute distress. Conjunctiva is pink. Sclera is nonicteric Oropharyngeal mucosa is normal. No neck masses or thyromegaly noted. Cardiac exam with regular rhythm normal S1 and S2 and he appears to have soft pericardial rub. Lungs are  clear to auscultation. Abdomen is soft with easily palpable liver but firm edge. Spleen is not palpable. He has mild tenderness primarily in right lower quadrant of his abdomen. No LE edema or clubbing noted.  Labs/studies Results: CRP on 10/04/2012 was elevated at 3.4 CBC from 10/29/2012 WBC 8.4, H&H 11.3 and 34.5, platelet count 271K. Comprehensive chemistry panel pertinent for serum sodium of 134, glucose 104, AST of 46 with normal ALT of 33 and albumin of 3.1. Recent chest film and CT anterior chest reviewed. I also reviewed MR enterography from last month; no effusion noted on cardiac images.  Assessment:  #1. Crohn's disease. He appears to be doing better since he was begun on budesonide which is being tapered. He remains on Pentasa and on maintenance with infliximab which will be put on hold until acute pericarditis sorted out. #2. 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 Two months ago he presented with lower extremity edema and abdominal swelling and I was concerned that he had developed ascites due to progressive liver disease but ultrasound revealed scant perihepatic ascites. He did not have chest symptoms at that time. He is currently on Urso. #3. New onset of pericarditis with significant pericardial effusion. He was begun on NSAID which he can take only for few days as it it will flare up his Crohn's. Since he has been on infliximab for several months need to rule out atypical infections including TB and histoplasmosis. Autoimmune pericarditis also remains in differential diagnosis.   Plan:  Decrease furosemide to 40 mg by mouth every morning. Decrease spironolactone to 50 mg by mouth daily. Hold infliximab infusion until etiology of pericarditis sorted out. Patient advised not to take Naprosyn for more than one week. Evaluation by Dr. Juanito Doom cardiology in a.m. Office visit in 8 weeks or earlier if necessary.

## 2012-11-01 ENCOUNTER — Ambulatory Visit (HOSPITAL_COMMUNITY)
Admission: RE | Admit: 2012-11-01 | Discharge: 2012-11-01 | Disposition: A | Discharge status: Home / Self Care | Payer: BC Managed Care – PPO | Source: Ambulatory Visit | Attending: Emergency Medicine | Admitting: Emergency Medicine

## 2012-11-01 DIAGNOSIS — I10 Essential (primary) hypertension: Secondary | ICD-10-CM | POA: Insufficient documentation

## 2012-11-01 DIAGNOSIS — I319 Disease of pericardium, unspecified: Secondary | ICD-10-CM

## 2012-11-01 DIAGNOSIS — I300 Acute nonspecific idiopathic pericarditis: Secondary | ICD-10-CM | POA: Insufficient documentation

## 2012-11-01 DIAGNOSIS — D649 Anemia, unspecified: Secondary | ICD-10-CM | POA: Insufficient documentation

## 2012-11-01 DIAGNOSIS — K5090 Crohn's disease, unspecified, without complications: Secondary | ICD-10-CM | POA: Insufficient documentation

## 2012-11-01 NOTE — Progress Notes (Signed)
  Echocardiogram Echocardiogram has been performed.  Nestor Ramp M 11/01/2012, 11:53 AM

## 2012-11-02 ENCOUNTER — Ambulatory Visit (INDEPENDENT_AMBULATORY_CARE_PROVIDER_SITE_OTHER): Payer: Medicare Other | Admitting: Physician Assistant

## 2012-11-02 ENCOUNTER — Encounter: Payer: Self-pay | Admitting: Cardiology

## 2012-11-02 VITALS — BP 134/72 | HR 96 | Ht 71.0 in | Wt 238.0 lb

## 2012-11-02 DIAGNOSIS — I319 Disease of pericardium, unspecified: Secondary | ICD-10-CM

## 2012-11-02 DIAGNOSIS — E8770 Fluid overload, unspecified: Secondary | ICD-10-CM

## 2012-11-02 DIAGNOSIS — E8779 Other fluid overload: Secondary | ICD-10-CM

## 2012-11-02 MED ORDER — POTASSIUM CHLORIDE ER 10 MEQ PO TBCR: 20.0000 meq | EXTENDED_RELEASE_TABLET | Freq: Every day | ORAL | Status: DC

## 2012-11-02 MED ORDER — FUROSEMIDE 80 MG PO TABS: 80.0000 mg | ORAL_TABLET | Freq: Every day | ORAL | Status: DC

## 2012-11-02 NOTE — Assessment & Plan Note (Addendum)
Patient has pericarditis with a free-flowing circumferential pericardial effusion of moderate size on 2-D echo. No evidence of hemodynamic compromise. Dr. Daleen Squibb recommends continuing Naprosyn for 10 days. He is to call us if he continues to have pain and may need another round of nonsteroidal anti-inflammatory drugs. Dr. Daleen Squibb did speak with Dr. Karilyn Cota who concurs.This is most likely viral and will resolve in time.

## 2012-11-02 NOTE — Progress Notes (Addendum)
HPI:  This is a 36 year old white male patient who was seen in the emergency room on 10/29/12 with chest pain. CT Scan showed no evidence of pulmonary embolus but moderate to large pericardial effusion with suspected pericardial enhancement worrisome for pericarditis. He was started on Naprosyn and scheduled to follow up here. The patient continues to complain of pain with deep inspiration bending over, or lying down. He says it has not improved since the Naprosyn was started. 2-D echo performed yesterday shows a free-flowing serve him [place] show a pericardial effusion of moderate size.Prior to this he had been treated with Levaquin for enlarged lymph nodes.  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 Karilyn Cota. He is on Lasix for chronic edema. This was gained 13 pounds.  Patient quit smoking on Saturday.   Allergies: -- Morphine And Related -- Hives  Current Outpatient Prescriptions on File Prior to Visit: albuterol (PROVENTIL HFA;VENTOLIN HFA) 108 (90 BASE) MCG/ACT inhaler, Inhale 2 puffs into the lungs every 6 (six) hours as needed. breathing , Disp: , Rfl:  dicyclomine (BENTYL) 20 MG tablet, Take 20 mg by mouth 2 (two) times daily as needed. Stomach pains , Disp: , Rfl:  enalapril (VASOTEC) 10 MG tablet, Take 10 mg by mouth daily.  , Disp: , Rfl:  ferrous sulfate 325 (65 FE) MG tablet, Take 325 mg by mouth 2 (two) times daily. , Disp: , Rfl:  fish oil-omega-3 fatty acids 1000 MG capsule, Take 1 g by mouth 3 (three) times daily., Disp: , Rfl:  ibuprofen (ADVIL,MOTRIN) 200 MG tablet, Take 400 mg by mouth every 6 (six) hours as needed. Fever, Disp: , Rfl:  InFLIXimab (REMICADE IV), Inject 5 mg into the vein. Patient is getting 5 mg/kg . First infusion was 2 weeks ago, patient going today for another infusion., Disp: , Rfl:  mesalamine (PENTASA) 250 MG CR capsule, Take 1,000 mg by mouth 2 (two) times daily., Disp: , Rfl:    Multiple Vitamins-Minerals (MULTIVITAMINS THER. W/MINERALS) TABS, Take 1 tablet by mouth daily.  , Disp: , Rfl:  naproxen (NAPROSYN) 500 MG tablet, Take 1 tablet (500 mg total) by mouth 2 (two) times daily., Disp: 20 tablet, Rfl: 0 Oxycodone HCl 20 MG TABS, Take 10 mg by mouth 3 (three) times daily. pain, Disp: , Rfl:  oxymorphone (OPANA) 10 MG tablet, Take 10 mg by mouth 2 (two) times daily., Disp: , Rfl:  pantoprazole (PROTONIX) 40 MG tablet, Take 1 tablet (40 mg total) by mouth daily., Disp: 30 tablet, Rfl: 4 Probiotic Product (ALIGN) 4 MG CAPS, Take 4 mg by mouth daily. , Disp: , Rfl:  promethazine (PHENERGAN) 25 MG tablet, Take 25 mg by mouth every 6 (six) hours as needed. Nausea/vomiting, Disp: , Rfl:  spironolactone (ALDACTONE) 50 MG tablet, Take 1 tablet (50 mg total) by mouth daily., Disp: 30 tablet, Rfl: 1 ursodiol (ACTIGALL) 500 MG tablet, Take 500 mg by mouth 2 (two) times daily.  , Disp: , Rfl:  [DISCONTINUED] furosemide (LASIX) 40 MG tablet, Take 1 tablet (40 mg total) by mouth daily., Disp: 30 tablet, Rfl: 1 [DISCONTINUED] potassium chloride (K-DUR) 10 MEQ tablet, Take 20 mEq by mouth every other day., Disp: , Rfl:     Past Medical History:   Fistula, anal  05/04/2011   Crohn's disease with fistula                    05/04/2011   Hepatomegaly                                    05/04/2011   Fatty liver                                     05/04/2011   GERD (gastroesophageal reflux disease)                       Crohn's disease                                             Past Surgical History:   ANAL EXAMINATION UNDER ANESTHESIA               02/11/2011     Comment:[name]   TREATMENT FISTULA ANAL                          07/03/11       Comment:This was a second surgery to repair Anal               fistula   APPENDECTOMY                                                 CHOLECYSTECTOMY                                              SMALL  INTESTINE SURGERY                                      STOMACH SURGERY                                             Review of patient's family history indicates:   Diabetes                       Mother                   Diabetes                       Brother                  Healthy                        Daughter                 Healthy  Son                      Social History   Marital Status: Married             Spouse Name:                      Years of Education:                 Number of children:             Occupational History   None on file  Social History Main Topics   Smoking Status: Former Smoker                   Packs/Day: .5    Years:           Types: Cigarettes     Start date: 10/28/2012   Smokeless Status: Current User                       Types: Snuff   Comment: Patient states that he stopped smoking this             weekend   Alcohol Use: No             Drug Use: No             Sexual Activity: Not on file        Other Topics            Concern   None on file  Social History Narrative   None on file    AVW:[id number] has chronic problems with Crohn's disease and edema. Otherwise see history of present illness.   PHYSICAL EXAM: 36 year old white male who does not feel well, in no acute distress. Neck: No JVD, HJR, Bruit, or thyroid enlargement  Lungs: No tachypnea, clear without wheezing, rales, or rhonchi  Cardiovascular: RRR, PMI not displaced, Patient has a friction rub at the left sternal border when he is lying supine, no gallops, bruit, thrill, or heave.  Abdomen: distended,BS normal. Soft without organomegaly, masses, lesions or tenderness.  Extremities: +1-2 lower extremity edema bilaterallywithout cyanosis, clubbing. Good distal pulses bilateral  SKin: Warm, no lesions or rashes   Musculoskeletal: No deformities  Neuro: no focal signs  BP 134/72  Pulse 96  Ht 5\' 11"  (1.803 m)  Wt 238 lb (107.956 kg)  BMI 33.19  kg/m2   YNW:GNFAO tachycardia at 117 beats per minute, low voltage, no ST changes consistent with pericarditis  CT 11/16: IMPRESSION: No evidence of pulmonary embolism.  Moderate to large pericardial effusion with suspected pericardial enhancement, worrisome for pericarditis.  Correlate for tamponade physiology.  Small left pleural effusion.  Associated left lower lobe atelectasis.  2Decho 11/01/12: Study Conclusions  - Left ventricle: The cavity size was normal. Wall thickness   was normal. Systolic function was vigorous. The estimated   ejection fraction was in the range of 65% to 70%. Wall   motion was normal; there were no regional wall motion   abnormalities. - Atrial septum: No defect or patent foramen ovale was   identified. - Pericardium, extracardiac: A free-flowing circumferential   pericardial effusion of moderate size was identified. The   fluidcontained a moderatequantity of fibrinousstrands.   There was no evidence of hemodynamic compromise.   His pericarditis is multifactorial. Discussed with Dr. Karilyn Cota. Goal of therapy is to reduce pain and ultimately eliminate.  He's been encouraged to increase protein intake as well as go back on his Lasix at 80 mg a day.

## 2012-11-02 NOTE — Patient Instructions (Addendum)
Your physician recommends that you schedule a follow-up appointment in: 1 month with Dr Daleen Squibb  Your physician has recommended you make the following change in your medication:  1 - INCRESE Lasix to 80 mg dialy 2 - INCREASE Potassium to 40 mg daily 3 - Continue Naprosyn for 10 days

## 2012-11-02 NOTE — Assessment & Plan Note (Signed)
Patient has significant fluid overload since his Lasix was decreased. Will increase his Lasix to 80 mg daily increase his K-Dur 20 mEq daily.

## 2012-11-03 ENCOUNTER — Telehealth (INDEPENDENT_AMBULATORY_CARE_PROVIDER_SITE_OTHER): Payer: Self-pay | Admitting: *Deleted

## 2012-11-03 NOTE — Telephone Encounter (Signed)
Patient has appt 12/27/12 @ 100 pm with Dr Stevphen [place], main hospital, 1st floor, patient is aware

## 2012-11-07 ENCOUNTER — Telehealth (INDEPENDENT_AMBULATORY_CARE_PROVIDER_SITE_OTHER): Payer: Self-pay | Admitting: *Deleted

## 2012-11-07 ENCOUNTER — Ambulatory Visit (HOSPITAL_COMMUNITY): Payer: BC Managed Care – PPO

## 2012-11-07 NOTE — Telephone Encounter (Signed)
David [name] has called and states that he is taking 80 mg of Lasix a day Altacdone 50 mg twice a day.He states that the fluid is not going down. His scrotum is so swollen it hurts to walk. He  says that the swelling is from his lower stomach down. Patient has not weighed

## 2012-11-08 ENCOUNTER — Telehealth: Payer: Self-pay | Admitting: Cardiology

## 2012-11-08 NOTE — Telephone Encounter (Signed)
Patient's wife states that patient's legs and feet are still swolen even being on fluid meds. / tgs

## 2012-11-08 NOTE — Telephone Encounter (Signed)
Dr.Rehman called and talked with the patient last night. He advised the patient he may need to see Dr.Wall as the swelling may be coming from the Peri carditis. David [name] is still waiting to her from his office about an appointment, once he hears he is going to call me back.

## 2012-11-08 NOTE — Telephone Encounter (Signed)
Please advise 

## 2012-11-14 NOTE — Telephone Encounter (Signed)
Patient left a telephone message on the phone. David [name] stated that he was still awaiting a phone call from Dr.Wall's office for an appointment. He says that it has been a rough few days. He continues to swell in the lower extremities, when he takes in a deep breath it hurts. In his message he stated that he had chest pains. Last time he weighed it was 240 something per David [name]. I called and spoke with David [name] with David [name] and explained the situation with her,Dr.Wall is there 1 day a month. She can give an appointment with NP  tomorrow 11/15/12. Patient called and made aware. He is to call her back to confirm the appointment tomorrow or if he is having the Chest Pain,ect precede to the ED for evaluation.

## 2012-11-14 NOTE — Telephone Encounter (Signed)
Patient has multiple reasons for swelling as we discussed in the office. Continue Lasix as dosed. We are just trying to make the swelling more tolerable. How is the Chest pain and is he off NSAID we prescribed?

## 2012-11-15 ENCOUNTER — Ambulatory Visit (INDEPENDENT_AMBULATORY_CARE_PROVIDER_SITE_OTHER): Payer: Medicare Other | Admitting: Adult Health

## 2012-11-15 ENCOUNTER — Inpatient Hospital Stay (HOSPITAL_COMMUNITY): Payer: Medicare Other

## 2012-11-15 ENCOUNTER — Inpatient Hospital Stay (HOSPITAL_COMMUNITY)
Admission: AD | Admit: 2012-11-15 | Discharge: 2012-11-22 | DRG: 238 | Disposition: A | Discharge status: Home / Self Care | Payer: Medicare Other | Source: Ambulatory Visit | Attending: Thoracic Surgery (Cardiothoracic Vascular Surgery) | Admitting: Thoracic Surgery (Cardiothoracic Vascular Surgery)

## 2012-11-15 ENCOUNTER — Encounter (HOSPITAL_COMMUNITY): Payer: Self-pay | Admitting: General Practice

## 2012-11-15 ENCOUNTER — Encounter: Payer: Self-pay | Admitting: Adult Health

## 2012-11-15 VITALS — BP 110/58 | HR 110 | Ht 71.0 in | Wt 246.0 lb

## 2012-11-15 DIAGNOSIS — I10 Essential (primary) hypertension: Secondary | ICD-10-CM

## 2012-11-15 DIAGNOSIS — R079 Chest pain, unspecified: Secondary | ICD-10-CM

## 2012-11-15 DIAGNOSIS — K50813 Crohn's disease of both small and large intestine with fistula: Secondary | ICD-10-CM

## 2012-11-15 DIAGNOSIS — K219 Gastro-esophageal reflux disease without esophagitis: Secondary | ICD-10-CM

## 2012-11-15 DIAGNOSIS — Z87891 Personal history of nicotine dependence: Secondary | ICD-10-CM

## 2012-11-15 DIAGNOSIS — I509 Heart failure, unspecified: Secondary | ICD-10-CM | POA: Diagnosis present

## 2012-11-15 DIAGNOSIS — F3289 Other specified depressive episodes: Secondary | ICD-10-CM | POA: Diagnosis present

## 2012-11-15 DIAGNOSIS — I319 Disease of pericardium, unspecified: Secondary | ICD-10-CM

## 2012-11-15 DIAGNOSIS — K7460 Unspecified cirrhosis of liver: Secondary | ICD-10-CM | POA: Diagnosis present

## 2012-11-15 DIAGNOSIS — Z7960 Long term (current) use of unspecified immunomodulators and immunosuppressants: Secondary | ICD-10-CM

## 2012-11-15 DIAGNOSIS — E871 Hypo-osmolality and hyponatremia: Secondary | ICD-10-CM | POA: Diagnosis present

## 2012-11-15 DIAGNOSIS — K7400 Hepatic fibrosis, unspecified: Secondary | ICD-10-CM

## 2012-11-15 DIAGNOSIS — T451X5A Adverse effect of antineoplastic and immunosuppressive drugs, initial encounter: Secondary | ICD-10-CM | POA: Diagnosis present

## 2012-11-15 DIAGNOSIS — K5080 Crohn's disease of both small and large intestine without complications: Secondary | ICD-10-CM | POA: Diagnosis present

## 2012-11-15 DIAGNOSIS — I314 Cardiac tamponade: Secondary | ICD-10-CM | POA: Diagnosis present

## 2012-11-15 DIAGNOSIS — Z79899 Other long term (current) drug therapy: Secondary | ICD-10-CM

## 2012-11-15 DIAGNOSIS — D62 Acute posthemorrhagic anemia: Secondary | ICD-10-CM | POA: Diagnosis not present

## 2012-11-15 DIAGNOSIS — E46 Unspecified protein-calorie malnutrition: Secondary | ICD-10-CM | POA: Diagnosis present

## 2012-11-15 DIAGNOSIS — F329 Major depressive disorder, single episode, unspecified: Secondary | ICD-10-CM | POA: Diagnosis present

## 2012-11-15 DIAGNOSIS — E875 Hyperkalemia: Secondary | ICD-10-CM | POA: Diagnosis not present

## 2012-11-15 DIAGNOSIS — N289 Disorder of kidney and ureter, unspecified: Secondary | ICD-10-CM | POA: Diagnosis present

## 2012-11-15 DIAGNOSIS — K7689 Other specified diseases of liver: Secondary | ICD-10-CM | POA: Diagnosis present

## 2012-11-15 DIAGNOSIS — R188 Other ascites: Secondary | ICD-10-CM | POA: Diagnosis present

## 2012-11-15 DIAGNOSIS — J90 Pleural effusion, not elsewhere classified: Secondary | ICD-10-CM | POA: Diagnosis present

## 2012-11-15 HISTORY — DX: Personal history of peptic ulcer disease: Z87.11

## 2012-11-15 HISTORY — DX: Essential (primary) hypertension: I10

## 2012-11-15 HISTORY — DX: Personal history of other medical treatment: Z92.89

## 2012-11-15 HISTORY — DX: Duodenal ulcer, unspecified as acute or chronic, without hemorrhage or perforation: K26.9

## 2012-11-15 HISTORY — DX: Anemia, unspecified: D64.9

## 2012-11-15 HISTORY — DX: Pericardial effusion (noninflammatory): I31.3

## 2012-11-15 HISTORY — DX: Hepatic fibrosis: K74.0

## 2012-11-15 HISTORY — DX: Personal history of other diseases of the digestive system: Z87.19

## 2012-11-15 HISTORY — DX: Major depressive disorder, single episode, unspecified: F32.9

## 2012-11-15 HISTORY — DX: Pneumonia, unspecified organism: J18.9

## 2012-11-15 HISTORY — DX: Hepatic fibrosis, unspecified: K74.00

## 2012-11-15 HISTORY — DX: Liver disease, unspecified: K76.9

## 2012-11-15 HISTORY — DX: Depression, unspecified: F32.A

## 2012-11-15 HISTORY — DX: Calculus of kidney: N20.0

## 2012-11-15 HISTORY — DX: Disease of pericardium, unspecified: I31.9

## 2012-11-15 HISTORY — DX: Shortness of breath: R06.02

## 2012-11-15 LAB — COMPREHENSIVE METABOLIC PANEL
ALT: 19 U/L (ref 0–53)
AST: 41 U/L — ABNORMAL HIGH (ref 0–37)
Albumin: 2 g/dL — ABNORMAL LOW (ref 3.5–5.2)
Alkaline Phosphatase: 154 U/L — ABNORMAL HIGH (ref 39–117)
CO2: 26 mEq/L (ref 19–32)
Chloride: 93 mEq/L — ABNORMAL LOW (ref 96–112)
GFR calc non Af Amer: 76 mL/min — ABNORMAL LOW (ref >90)
Potassium: 4.6 mEq/L (ref 3.5–5.1)
Sodium: 128 mEq/L — ABNORMAL LOW (ref 135–145)
Total Bilirubin: 0.5 mg/dL (ref 0.3–1.2)

## 2012-11-15 LAB — CBC WITH DIFFERENTIAL/PLATELET
Basophils Absolute: 0 10*3/uL (ref 0.0–0.1)
Basophils Relative: 0 % (ref 0–1)
HCT: 26.1 % — ABNORMAL LOW (ref 39.0–52.0)
Lymphocytes Relative: 8 % — ABNORMAL LOW (ref 12–46)
MCHC: 33.3 g/dL (ref 30.0–36.0)
Monocytes Absolute: 1.8 10*3/uL — ABNORMAL HIGH (ref 0.1–1.0)
Neutro Abs: 11.6 10*3/uL — ABNORMAL HIGH (ref 1.7–7.7)
Neutrophils Relative %: 79 % — ABNORMAL HIGH (ref 43–77)
RDW: 19.8 % — ABNORMAL HIGH (ref 11.5–15.5)
WBC: 14.7 10*3/uL — ABNORMAL HIGH (ref 4.0–10.5)

## 2012-11-15 LAB — PROTIME-INR
INR: 1.3 (ref 0.00–1.49)
Prothrombin Time: 15.9 seconds — ABNORMAL HIGH (ref 11.6–15.2)

## 2012-11-15 LAB — APTT: aPTT: 44 seconds — ABNORMAL HIGH (ref 24–37)

## 2012-11-15 LAB — PHOSPHORUS: Phosphorus: 3.8 mg/dL (ref 2.3–4.6)

## 2012-11-15 LAB — MAGNESIUM: Magnesium: 1.9 mg/dL (ref 1.5–2.5)

## 2012-11-15 MED ORDER — ASPIRIN EC 81 MG PO TBEC: 81.0000 mg | DELAYED_RELEASE_TABLET | Freq: Every day | ORAL | Status: DC

## 2012-11-15 MED ORDER — ALUM & MAG HYDROXIDE-SIMETH 200-200-20 MG/5ML PO SUSP: 30.0000 mL | Freq: Four times a day (QID) | ORAL | Status: DC | PRN

## 2012-11-15 MED ORDER — OXYCODONE HCL 5 MG PO TABS: 5.0000 mg | ORAL_TABLET | ORAL | Status: DC | PRN

## 2012-11-15 MED ORDER — MORPHINE SULFATE 2 MG/ML IJ SOLN: 2.0000 mg | INTRAMUSCULAR | Status: DC | PRN

## 2012-11-15 MED ORDER — OXYCODONE HCL 5 MG PO TABS
5.0000 mg | ORAL_TABLET | ORAL | Status: DC | PRN
  Administered 2012-11-15: 5 mg via ORAL
  Filled 2012-11-15: qty 1

## 2012-11-15 MED ORDER — ASPIRIN EC 81 MG PO TBEC
81.0000 mg | DELAYED_RELEASE_TABLET | Freq: Every day | ORAL | Status: DC
  Administered 2012-11-15 – 2012-11-22 (×7): 81 mg via ORAL
  Filled 2012-11-15 (×8): qty 1

## 2012-11-15 MED ORDER — ONDANSETRON HCL 4 MG PO TABS: 4.0000 mg | ORAL_TABLET | Freq: Four times a day (QID) | ORAL | Status: DC | PRN

## 2012-11-15 MED ORDER — DOCUSATE SODIUM 100 MG PO CAPS: 100.0000 mg | ORAL_CAPSULE | Freq: Two times a day (BID) | ORAL | Status: DC

## 2012-11-15 MED ORDER — SODIUM CHLORIDE 0.9 % IJ SOLN
3.0000 mL | Freq: Two times a day (BID) | INTRAMUSCULAR | Status: DC
Start: 2012-11-15 — End: 2012-11-17
  Administered 2012-11-16 (×2): 3 mL via INTRAVENOUS

## 2012-11-15 MED ORDER — DOCUSATE SODIUM 100 MG PO CAPS
100.0000 mg | ORAL_CAPSULE | Freq: Two times a day (BID) | ORAL | Status: DC
  Administered 2012-11-15 – 2012-11-16 (×3): 100 mg via ORAL
  Filled 2012-11-15 (×5): qty 1

## 2012-11-15 MED ORDER — SODIUM CHLORIDE 0.9 % IV SOLN: 250.0000 mL | INTRAVENOUS | Status: DC | PRN

## 2012-11-15 MED ORDER — PROMETHAZINE HCL 25 MG PO TABS: 12.5000 mg | ORAL_TABLET | Freq: Four times a day (QID) | ORAL | Status: DC | PRN

## 2012-11-15 MED ORDER — ACETAMINOPHEN 325 MG PO TABS
650.0000 mg | ORAL_TABLET | Freq: Four times a day (QID) | ORAL | Status: DC | PRN
  Administered 2012-11-15 – 2012-11-16 (×2): 650 mg via ORAL
  Filled 2012-11-15 (×2): qty 2

## 2012-11-15 MED ORDER — ALBUTEROL SULFATE (5 MG/ML) 0.5% IN NEBU
2.5000 mg | INHALATION_SOLUTION | Freq: Four times a day (QID) | RESPIRATORY_TRACT | Status: DC
  Administered 2012-11-16 – 2012-11-20 (×17): 2.5 mg via RESPIRATORY_TRACT
  Filled 2012-11-15 (×18): qty 0.5

## 2012-11-15 MED ORDER — ONDANSETRON HCL 4 MG/2ML IJ SOLN: 4.0000 mg | Freq: Four times a day (QID) | INTRAMUSCULAR | Status: DC | PRN

## 2012-11-15 MED ORDER — ACETAMINOPHEN 650 MG RE SUPP: 650.0000 mg | Freq: Four times a day (QID) | RECTAL | Status: DC | PRN

## 2012-11-15 MED ORDER — ZOLPIDEM TARTRATE 5 MG PO TABS: 5.0000 mg | ORAL_TABLET | Freq: Every evening | ORAL | Status: DC | PRN

## 2012-11-15 NOTE — Assessment & Plan Note (Signed)
He is hypotensive with significant fluid overload. He will be admitted to Step Down at [facility].

## 2012-11-15 NOTE — Assessment & Plan Note (Signed)
GI will be consulted for this diagnosis and its contribution to fluid overload, probable ascites.

## 2012-11-15 NOTE — Progress Notes (Deleted)
Name: David [name]    DOB: [birth year]  Age: 36 y.o.  MR#: [id number]       PCP:  [name], MD      Insurance: @PAYORNAME @   CC:   No chief complaint on file.   VS BP 110/58  Pulse 110  Ht 5\' 11"  (1.803 m)  Wt 246 lb (111.585 kg)  BMI 34.31 kg/m2  SpO2 91%  Weights Current Weight  11/15/12 246 lb (111.585 kg)  11/02/12 238 lb (107.956 kg)  10/31/12 221 lb 9.6 oz (100.517 kg)    Blood Pressure  BP Readings from Last 3 Encounters:  11/15/12 110/58  11/02/12 134/72  10/31/12 110/70     Admit date:  (Not on file) Last encounter with RMR:  Visit date not found   Allergy Allergies  Allergen Reactions  . Morphine And Related Hives    Current Outpatient Prescriptions  Medication Sig Dispense Refill  . albuterol (PROVENTIL HFA;VENTOLIN HFA) 108 (90 BASE) MCG/ACT inhaler Inhale 2 puffs into the lungs every 6 (six) hours as needed. breathing       . dicyclomine (BENTYL) 20 MG tablet Take 20 mg by mouth 2 (two) times daily as needed. Stomach pains       . enalapril (VASOTEC) 10 MG tablet Take 10 mg by mouth daily.        . ferrous sulfate 325 (65 FE) MG tablet Take 325 mg by mouth 2 (two) times daily.       . fish oil-omega-3 fatty acids 1000 MG capsule Take 1 g by mouth 3 (three) times daily.      . furosemide (LASIX) 80 MG tablet Take 1 tablet (80 mg total) by mouth daily.  30 tablet  6  . ibuprofen (ADVIL,MOTRIN) 200 MG tablet Take 400 mg by mouth every 6 (six) hours as needed. Fever      . InFLIXimab (REMICADE IV) Inject 5 mg into the vein. Patient is getting 5 mg/kg . First infusion was 2 weeks ago, patient going today for another infusion.      . mesalamine (PENTASA) 250 MG CR capsule Take 1,000 mg by mouth 2 (two) times daily.      . Multiple Vitamins-Minerals (MULTIVITAMINS THER. W/MINERALS) TABS Take 1 tablet by mouth daily.        . naproxen (NAPROSYN) 500 MG tablet Take 1 tablet (500 mg total) by mouth 2 (two) times daily.  20 tablet  0  . Oxycodone HCl 20 MG TABS  Take 10 mg by mouth 3 (three) times daily. pain      . oxymorphone (OPANA) 10 MG tablet Take 10 mg by mouth 2 (two) times daily.      . pantoprazole (PROTONIX) 40 MG tablet Take 1 tablet (40 mg total) by mouth daily.  30 tablet  4  . potassium chloride (K-DUR) 10 MEQ tablet Take 2 tablets (20 mEq total) by mouth daily.  60 tablet  6  . Probiotic Product (ALIGN) 4 MG CAPS Take 4 mg by mouth daily.       . promethazine (PHENERGAN) 25 MG tablet Take 25 mg by mouth every 6 (six) hours as needed. Nausea/vomiting      . spironolactone (ALDACTONE) 50 MG tablet Take 1 tablet (50 mg total) by mouth daily.  30 tablet  1  . ursodiol (ACTIGALL) 500 MG tablet Take 500 mg by mouth 2 (two) times daily.          Discontinued Meds:   There are no discontinued  medications.  Patient Active Problem List  Diagnosis  . 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 GERD (gastroesophageal reflux disease)  . Hepatic fibrosis  . Abdominal pain, chronic, right lower quadrant  . Bilateral kidney stones  . Hypertension  . Anemia  . Fluid overload  . Pericarditis with effusion    LABS Admission on 10/29/2012, Discharged on 10/29/2012  Component Date Value  . Troponin I 10/29/2012 1.15*  . WBC 10/29/2012 8.4   . RBC 10/29/2012 4.22   . Hemoglobin 10/29/2012 11.3*  . HCT 10/29/2012 34.5*  . MCV 10/29/2012 81.8   . [facility] 10/29/2012 26.8   . MCHC 10/29/2012 32.8   . RDW 10/29/2012 23.4*  . Platelets 10/29/2012 271   . Neutrophils Relative 10/29/2012 71   . Lymphocytes Relative 10/29/2012 13   . Monocytes Relative 10/29/2012 13*  . Eosinophils Relative 10/29/2012 3   . Basophils Relative 10/29/2012 0   . Neutro Abs 10/29/2012 5.9   . Lymphs Abs 10/29/2012 1.1   . Monocytes Absolute 10/29/2012 1.1*  . Eosinophils Absolute 10/29/2012 0.3   . Basophils Absolute 10/29/2012 0.0   . Smear Review 10/29/2012 MORPHOLOGY UNREMARKABLE   . Sodium 10/29/2012 134*  . Potassium 10/29/2012 4.5   .  Chloride 10/29/2012 97   . CO2 10/29/2012 27   . Glucose, Bld 10/29/2012 104*  . BUN 10/29/2012 10   . Creatinine, Ser 10/29/2012 0.70   . Calcium 10/29/2012 9.4   . Total Protein 10/29/2012 6.9   . Albumin 10/29/2012 3.1*  . AST 10/29/2012 46*  . ALT 10/29/2012 33   . Alkaline Phosphatase 10/29/2012 112   . Total Bilirubin 10/29/2012 0.7   . GFR calc non Af Amer 10/29/2012 >90   . GFR calc Af Amer 10/29/2012 >90   . D-Dimer, Quant 10/29/2012 2.80*  Office Visit on 09/20/2012  Component Date Value  . WBC 10/04/2012 7.1   . RBC 10/04/2012 4.50   . Hemoglobin 10/04/2012 11.5*  . HCT 10/04/2012 36.5*  . MCV 10/04/2012 81.1   . MCH 10/04/2012 25.6*  . MCHC 10/04/2012 31.5   . RDW 10/04/2012 24.4*  . Platelets 10/04/2012 133*  . Neutrophils Relative 10/04/2012 64   . Neutro Abs 10/04/2012 4.5   . Lymphocytes Relative 10/04/2012 24   . Lymphs Abs 10/04/2012 1.7   . Monocytes Relative 10/04/2012 9   . Monocytes Absolute 10/04/2012 0.6   . Eosinophils Relative 10/04/2012 3   . Eosinophils Absolute 10/04/2012 0.2   . Basophils Relative 10/04/2012 0   . Basophils Absolute 10/04/2012 0.0   . Smear Review 10/04/2012 Criteria for review not met   . Sodium 10/04/2012 142   . Potassium 10/04/2012 4.0   . Chloride 10/04/2012 104   . CO2 10/04/2012 27   . Glucose, Bld 10/04/2012 147*  . BUN 10/04/2012 10   . Creat 10/04/2012 0.68   . Total Bilirubin 10/04/2012 0.5   . Alkaline Phosphatase 10/04/2012 112   . AST 10/04/2012 78*  . ALT 10/04/2012 81*  . Total Protein 10/04/2012 6.7   . Albumin 10/04/2012 3.7   . Calcium 10/04/2012 8.9   . CRP 10/04/2012 3.4*  Orders Only on 09/06/2012  Component Date Value  . WBC 09/06/2012 5.8   . RBC 09/06/2012 3.84*  . Hemoglobin 09/06/2012 8.6*  . HCT 09/06/2012 27.8*  . MCV 09/06/2012 72.4*  . [facility] 09/06/2012 22.4*  . MCHC 09/06/2012 30.9   . RDW 09/06/2012 17.7*  . Platelets 09/06/2012  166   . Sodium 09/06/2012 138   . Potassium  09/06/2012 4.4   . Chloride 09/06/2012 102   . CO2 09/06/2012 27   . Glucose, Bld 09/06/2012 95   . BUN 09/06/2012 12   . Creat 09/06/2012 0.65   . Total Bilirubin 09/06/2012 0.6   . Alkaline Phosphatase 09/06/2012 124*  . AST 09/06/2012 75*  . ALT 09/06/2012 42   . Total Protein 09/06/2012 6.2   . Albumin 09/06/2012 3.3*  . Calcium 09/06/2012 8.6   . Hemoglobin A1C 09/06/2012 6.1*  . Mean Plasma Glucose 09/06/2012 128*  Office Visit on 09/06/2012  Component Date Value  . AFP-Tumor Marker 09/06/2012 4.0   . Prothrombin Time 09/06/2012 14.3   . INR 09/06/2012 1.11      Results for this Opt Visit:     Results for orders placed during the hospital encounter of 10/29/12  TROPONIN I      Component Value Range   Troponin I 1.15 (*) <0.30 ng/mL  CBC WITH DIFFERENTIAL      Component Value Range   WBC 8.4  4.0 - 10.5 K/uL   RBC 4.22  4.22 - 5.81 MIL/uL   Hemoglobin 11.3 (*) 13.0 - 17.0 g/dL   HCT 16.1 (*) 09.6 - 04.5 %   MCV 81.8  78.0 - 100.0 fL   MCH 26.8  26.0 - 34.0 pg   MCHC 32.8  30.0 - 36.0 g/dL   RDW 40.9 (*) 81.1 - 91.4 %   Platelets 271  150 - 400 K/uL   Neutrophils Relative 71  43 - 77 %   Lymphocytes Relative 13  12 - 46 %   Monocytes Relative 13 (*) 3 - 12 %   Eosinophils Relative 3  0 - 5 %   Basophils Relative 0  0 - 1 %   Neutro Abs 5.9  1.7 - 7.7 K/uL   Lymphs Abs 1.1  0.7 - 4.0 K/uL   Monocytes Absolute 1.1 (*) 0.1 - 1.0 K/uL   Eosinophils Absolute 0.3  0.0 - 0.7 K/uL   Basophils Absolute 0.0  0.0 - 0.1 K/uL   Smear Review MORPHOLOGY UNREMARKABLE    COMPREHENSIVE METABOLIC PANEL      Component Value Range   Sodium 134 (*) 135 - 145 mEq/L   Potassium 4.5  3.5 - 5.1 mEq/L   Chloride 97  96 - 112 mEq/L   CO2 27  19 - 32 mEq/L   Glucose, Bld 104 (*) 70 - 99 mg/dL   BUN 10  6 - 23 mg/dL   Creatinine, Ser 7.82  0.50 - 1.35 mg/dL   Calcium 9.4  8.4 - 95.6 mg/dL   Total Protein 6.9  6.0 - 8.3 g/dL   Albumin 3.1 (*) 3.5 - 5.2 g/dL   AST 46 (*) 0 - 37 U/L    ALT 33  0 - 53 U/L   Alkaline Phosphatase 112  39 - 117 U/L   Total Bilirubin 0.7  0.3 - 1.2 mg/dL   GFR calc non Af Amer >90  >90 mL/min   GFR calc Af Amer >90  >90 mL/min  D-DIMER, QUANTITATIVE      Component Value Range   D-Dimer, Quant 2.80 (*) 0.00 - 0.48 ug/mL-FEU    EKG Orders placed in visit on 11/02/12  . EKG 12-LEAD     Prior Assessment and Plan Problem List as of 11/15/2012          Crohn's disease of both small  and large intestine with fistula   GERD (gastroesophageal reflux disease)   Hepatic fibrosis   Abdominal pain, chronic, right lower quadrant   Bilateral kidney stones   Hypertension   Anemia   Fluid overload   Last Assessment & Plan Note   11/02/2012 Office Visit Signed 11/02/2012 10:25 AM by Dyann Kief, PA    Patient has significant fluid overload since his Lasix was decreased. Will increase his Lasix to 80 mg daily increase his K-Dur 20 mEq daily.    Pericarditis with effusion   Last Assessment & Plan Note   11/02/2012 Office Visit Addendum 11/02/2012 10:27 AM by Dyann Kief, PA    Patient has pericarditis with a free-flowing circumferential pericardial effusion of moderate size on 2-D echo. No evidence of hemodynamic compromise. Dr. Daleen Squibb recommends continuing Naprosyn for 10 days. He is to call us if he continues to have pain and may need another round of nonsteroidal anti-inflammatory drugs. Dr. Daleen Squibb did speak with Dr. Karilyn Cota who concurs.This is most likely viral and will resolve in time.        Imaging: Dg Chest 2 View  10/29/2012  *RADIOLOGY REPORT*  Clinical Data: Chest pain.  CHEST - 2 VIEW  Comparison: 01/24/2009.  Findings: The cardiopericardial silhouette is enlarged.  The configuration suggests pericardial effusion.  Retrocardiac atelectasis is present.  Small left pleural effusion.  No airspace disease is present.  Scattered areas of atelectasis. Monitoring leads are projected over the chest.  On the frontal view, there is increased  density along the right chest wall with smooth margins.  This may represent pleural-based mass or loculated pleural fluid.  This pleural-based mass measures 75 mm x 26 mm.  Prior comparison exam was normal.  IMPRESSION: 1.  Enlargement of the cardiopericardial silhouette with configuration that suggests pericardial effusion.  Consider echocardiography. 2. Small left pleural effusion.  Pleural based opacity right lower chest may represent loculated pleural fluid or pleural mass. Consider follow-up chest CT with infusion for further assessment.   Original Report Authenticated By: Andreas Newport, M.D.    Ct Angio Chest W/cm &/or Wo Cm  10/29/2012  *RADIOLOGY REPORT*  Clinical Data: Chest pain, abnormal chest radiograph  CT ANGIOGRAPHY CHEST  Technique:  Multidetector CT imaging of the chest using the standard protocol during bolus administration of intravenous contrast. Multiplanar reconstructed images including MIPs were obtained and reviewed to evaluate the vascular anatomy.  Contrast:  100 ml Omnipaque-300 IV  Comparison: Chest radiographs dated 10/29/2012  Findings: No evidence of pulmonary embolism.  Small left pleural effusion.  Associated left lower lobe opacity, likely atelectasis.  No pneumothorax.  Visualized thyroid is unremarkable.  The heart is normal in size.  Moderate to large pericardial effusion.  Suspected faint enhancement of the parietal pericardium (series 4/image 47).  These findings are worrisome for pericarditis.  Small mediastinal lymph nodes measuring up to 10 mm short axis (series 4/image 24).  No suspicious hilar or axillary lymphadenopathy.  Visualized upper abdomen is grossly unremarkable.  Very mild degenerative changes of the lower thoracic spine.  IMPRESSION: No evidence of pulmonary embolism.  Moderate to large pericardial effusion with suspected pericardial enhancement, worrisome for pericarditis.  Correlate for tamponade physiology.  Small left pleural effusion.  Associated left  lower lobe atelectasis.   Original Report Authenticated By: Charline Bills, M.D.      [facility] Calculation: Score not calculated. Missing: Total Cholesterol, HDL

## 2012-11-15 NOTE — Progress Notes (Signed)
HPI: David [name] is a 36 year old patient of Dr. Juanito Doom where sitting on followup for patient complaints of increasing shortness of breath, chest discomfort, abdominal swelling, and lower extremity edema, the patient was last seen by David [name] and PA on 22,013 and placed on NSAIDs secondary to history of pericarditis. The patient had an echocardiogram on 10/29/2012 with chest pain, CT scan showed no evidence of pulmonary emboli but moderate to large pericardial effusion with suspected pericardial enhancement worrisome for pericarditis. Repeat echocardiogram revealed free-flowing or cardial effusion of moderate size. His Lasix was changed from 80 mg a day to 80 mg twice a day. Since that time the patient has gained 25 pounds. He is having more PND, and distention in his abdomen. He was advised by Dr. Daleen Squibb via the phone to take another course of NSAIDs. Today he is very short of breath with blood pressure of 110/58 which is a drop from 1 3472 on last visit I have asked Dr. Dietrich Pates also tell me to assess this patient he does have pulses paradoxus with a systolic of 70. Echocardiogram is being performed in the clinic room with no RV collapse seen, or evidence of tampanade.   Allergies  Allergen Reactions  . Morphine And Related Hives    Current Outpatient Prescriptions  Medication Sig Dispense Refill  . albuterol (PROVENTIL HFA;VENTOLIN HFA) 108 (90 BASE) MCG/ACT inhaler Inhale 2 puffs into the lungs every 6 (six) hours as needed. breathing       . dicyclomine (BENTYL) 20 MG tablet Take 20 mg by mouth 2 (two) times daily as needed. Stomach pains       . enalapril (VASOTEC) 10 MG tablet Take 10 mg by mouth daily.        . ferrous sulfate 325 (65 FE) MG tablet Take 325 mg by mouth 2 (two) times daily.       . fish oil-omega-3 fatty acids 1000 MG capsule Take 1 g by mouth 3 (three) times daily.      . furosemide (LASIX) 80 MG tablet Take 1 tablet (80 mg total) by mouth daily.  30 tablet  6  . ibuprofen  (ADVIL,MOTRIN) 200 MG tablet Take 400 mg by mouth every 6 (six) hours as needed. Fever      . InFLIXimab (REMICADE IV) Inject 5 mg into the vein. Patient is getting 5 mg/kg . First infusion was 2 weeks ago, patient going today for another infusion.      . mesalamine (PENTASA) 250 MG CR capsule Take 1,000 mg by mouth 2 (two) times daily.      . Multiple Vitamins-Minerals (MULTIVITAMINS THER. W/MINERALS) TABS Take 1 tablet by mouth daily.        . naproxen (NAPROSYN) 500 MG tablet Take 1 tablet (500 mg total) by mouth 2 (two) times daily.  20 tablet  0  . Oxycodone HCl 20 MG TABS Take 10 mg by mouth 3 (three) times daily. pain      . oxymorphone (OPANA) 10 MG tablet Take 10 mg by mouth 2 (two) times daily.      . pantoprazole (PROTONIX) 40 MG tablet Take 1 tablet (40 mg total) by mouth daily.  30 tablet  4  . potassium chloride (K-DUR) 10 MEQ tablet Take 2 tablets (20 mEq total) by mouth daily.  60 tablet  6  . Probiotic Product (ALIGN) 4 MG CAPS Take 4 mg by mouth daily.       . promethazine (PHENERGAN) 25 MG tablet Take 25 mg  by mouth every 6 (six) hours as needed. Nausea/vomiting      . spironolactone (ALDACTONE) 50 MG tablet Take 1 tablet (50 mg total) by mouth daily.  30 tablet  1  . ursodiol (ACTIGALL) 500 MG tablet Take 500 mg by mouth 2 (two) times daily.          Past Medical History  Diagnosis Date  . 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 Crohn's disease with fistula 05/04/2011  . Hepatomegaly 05/04/2011  . Fatty liver 05/04/2011  . GERD (gastroesophageal reflux disease)   . Crohn's disease     Past Surgical History  Procedure Date  . Anal examination under anesthesia 02/11/2011    [name]  . Treatment fistula anal 07/03/11    This was a second surgery to repair Anal fistula  . Appendectomy   . Cholecystectomy   . Small intestine surgery   . Stomach surgery     ROS.NROS  PHYSICAL EXAM BP 110/58  Pulse 110  Ht 5\' 11"  (1.803 m)  Wt 246 lb (111.585 kg)  BMI 34.31 kg/m2  SpO2  91%  General: Well developed, well nourished, in no acute distress Head: Eyes PERRLA, No xanthomas.   Normal cephalic and atramatic  Lungs: Diminished breathsounds in the bases. Heart: HRIR S1 S2, pericrdial rub is noted,  Pulses are 2+ & equal radial, bounding carotid upstrokes.            No carotid bruit. No JVD.  No abdominal bruits. No femoral bruits. Abdomen: Bowel sounds are positive, abdomen distended and very tender to palpation with hyperactive bowel sounds. Large midline well-healed scar. Msk:  Back normal, normal gait. Normal strength and tone for age. Extremities: No clubbing, cyanosis 2+-3+ edema above the knees bilaterally.  DP +=1 Neuro: Alert and oriented X 3. Psych:  Good affect, responds appropriately EKG: Sinus tachycardia with PAC's, rate of  110 bpm.  ASSESSMENT AND PLAN

## 2012-11-15 NOTE — Progress Notes (Signed)
David [name]  36 y.o.  male  Subjective: Long-standing history of Crohn's disease and chronic liver disease. Recent diagnosis of pericarditis. Now with progressive fluid retention, edema, orthopnea, PND, mild lightheadedness without falls or syncope and relative hypotension.  Allergy: Morphine and related  Objective: Vital signs in last 24 hours: Temp:  [98.7 F (37.1 C)] 98.7 F (37.1 C) (12/03 1742) Pulse Rate:  [108-110] 108  (12/03 1742) Resp:  [20] 20  (12/03 1742) BP: (89-110)/(46-58) 89/46 mmHg (12/03 1742) SpO2:  [91 %-96 %] 96 % (12/03 1742) Weight:  [111.585 kg (246 lb)-112.537 kg (248 lb 1.6 oz)] 112.537 kg (248 lb 1.6 oz) (12/03 1742)  112.537 kg (248 lb 1.6 oz) Body mass index is 34.60 kg/(m^2).  Weight change:  Last BM Date: 11/15/12 Less than 10 mm of pulsus paradox General- Well developed; no acute distress; chronically ill-appearing ; weak and washed out Neck- moderate JVD, no carotid bruits Lungs- clear lung fields; normal I:E ratio; decreased breath sounds at the bases Cardiovascular-  normal S1 and S2; faint friction rub; modest systolic ejection murmur; no third heart sound Abdomen- normal bowel sounds; soft and non-tender without masses or organomegaly; midline surgical scar that healed by secondary intent; distended; abdominal wall edema; resonant to percussion Skin- Warm, no significant lesions Extremities- decreased distal pulses; 2+ lower extremity edema  Lab Results: Cardiac Markers:  No results found for this basename: TROPONINI:2,CK,MB:2 in the last 72 hours CBC:   Basename 11/15/12 1844  WBC 14.7*  HGB 8.7*  HCT 26.1*  PLT 239   BMET:  Basename 11/15/12 1844  NA 128*  K 4.6  CL 93*  CO2 26  GLUCOSE 99  BUN 28*  CREATININE 1.20  CALCIUM 8.2*   Hepatic Function:   Basename 11/15/12 1844  PROT 5.7*  ALBUMIN 2.0*  AST 41*  ALT 19  ALKPHOS 154*  BILITOT 0.5  BILIDIR --  IBILI --   EKG:  No tracing available for review  CXR  11/15/12: Cardiomegaly; mild interstitial edema; increased left pleural effusion and right pleural based mass.  BNP: 1454  PT/PTT: 15.9/44  Orders placed during the hospital encounter of 11/15/12  . EKG 12-LEAD-pending    Echocardiogram: Brief repeat study performed in office revealing enlargement of pericardial effusion-now moderate to large, effusion contains proteinaceous stranding; normal LA, RA, RV and LV size; normal LV function; no significant valvular abnormalities.  Assessment/Plan: Pericarditis:  Pericarditis is persistent without response to 2 weeks of nonsteroidal medication.  Although there is no paradox and no echocardiographic evidence for tamponade, the presence of systolic blood pressure in the 80s is of substantial concern.  Diagnostic and therapeutic pericardiocentesis may be advisable. Simultaneous right heart catheterization would provide useful information.  Chronic hepatic disease: Etiology unclear. Patient believes that methotrexate was responsible. He now appears to have a rapidly progressive deterioration in the synthetic function of the liver with severe hypoalbuminemia and mildly abnormal coagulation studies. Urinalysis is pending to rule out nephrotic syndrome.  GI consultation should be obtained in the morning. An abdominal ultrasound has been ordered to evaluate for ascites and to assess hepatic size and ultrasounic appearance.  Crohn's disease-disease activity does not appear high; could be responsible for hepatic disease, but the connection to pericarditis, if any, is not clear.  Anemia-moderate; evaluate for iron deficiency and blood loss.  Possibly directly related to chronic inflammation.  Fluid overload-I am reluctant to proceed with diuresis in the setting of relative hypotension, but this will likely be necessary in  the near future.  Antihypertensive drugs have been discontinued. Hypoalbuminemia is likely contributing. Rule out nephrotic syndrome. HIV testing  will be performed and TSH checked.   LOS: 0 days   [place] Bing 11/15/2012, 10:03 PM

## 2012-11-15 NOTE — H&P (Signed)
Physician Discharge Summary  Patient ID: David [name] MRN: [id number] DOB/AGE: [birth year] 36 y.o. Admit date: (Not on file)  Primary Care Physician:David S, MD Primary Cardiologist: [place] Active Problems: Hypotension Liver Fibrosis Massive Fluid Overload Pericardial Effusion Chron'[name] Disease Anemia   HPI:  [name] is a 36 year old Caucasian male patient of Dr. Daleen [name], Dr,[place], and Dr. Karilyn [name] GI, coursing in the office today secondary to ongoing fluid retention. The patient was seen in the emergency room initially on 10/29/2012 with chest pain. His CT scan showed no evidence of pulmonary embolus but a moderate to large pericardial effusion with suspected, pericardial enhancement was worrisome for pericarditis. He was started on Naprosyn and scheduled to have followup. He was seen by David [name] n this PDA on 11/02/2012 and had gained approximately 13 pounds. The patient'[name] Lasix was increased from 80 mg daily to 80 mg twice a day. The patient has subsequently gained up to 25 pounds since being seen last. The patient was continued on NSAIDs her notes from Dr. Daleen [name] via telephone messaging to our [place] office. The patient continued to have chest discomfort, PND, edema up to his knees, dyspnea, and overall fatigue. He also had complaints of early thigh AV, has not been able to eat very much only drinking a little water and Gatorade. He comes today with substantial fluid overload, pale, with continued complaints of inspirational discomfort.    On assessment the patient was found to have a pericardial rub, 2+ edema to the knees, positive for JVD, and very hypotensive with a systolic blood pressure 80. Dr. Dietrich [name] was asked to see the patient in consultation with David Reining NP, with whom he was originally scheduled. It was Ms. David Rumore NP assessment the patient needed to be admitted for fluid overload, and ongoing assessment of liver and pericardial effusion. He was seen and  evaluated more extensively by Dr. [place] [name], with positive pulses are.cyst with a systolic of 70. Echocardiogram was completed in the clinic room which did not reveal cardiac, with Normal RV Function. The patient was advised to be admitted to [facility], where he will be placed in Step down  Unit  Past Medical History  Diagnosis Date  . 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 Crohn'[name] disease with fistula 05/04/2011  . Hepatomegaly 05/04/2011  . Fatty liver 05/04/2011  . GERD (gastroesophageal reflux disease)   . Crohn'[name] disease     Family History  Problem Relation Age of Onset  . Diabetes Mother   . Diabetes Brother   . Healthy Daughter   . Healthy Son     History   Social History  . Marital Status: Married    Spouse Name: N/A    Number of Children: N/A  . Years of Education: N/A   Occupational History  . Not on file.   Social History Main Topics  . Smoking status: Former Smoker -- 0.5 packs/day    Types: Cigarettes    Start date: 10/28/2012  . Smokeless tobacco: Current User    Types: Snuff     Comment: Patient states that he stopped smoking this weekend  . Alcohol Use: No  . Drug Use: No  . Sexually Active: Not on file   Other Topics Concern  . Not on file   Social History Narrative  . No narrative on file    Past Surgical History  Procedure Date  . Anal examination under anesthesia 02/11/2011    [name]  . Treatment fistula anal 07/03/11    This was  a second surgery to repair Anal fistula  . Appendectomy   . Cholecystectomy   . Small intestine surgery   . Stomach surgery       Physical Exam: Blood pressure 110/58, pulse 110, height 5\' 11"  (1.803 m), weight 246 lb (111.585 kg), SpO2 91.00%.   General: Well developed, well nourished, iin mild respiratory distress, pale Head: Eyes PERRLA, No xanthomas.   Normal cephalic and atramatic  Lungs:Diminshed bilaterally, without wheezes or rhonchi Heart: HRIR S1 S2, with pericardial rub.  Pulses are 2+ &  equal.            No carotid bruit. Positive for JVD, HJR, with bounding AV wave..  No abdominal bruits. No femoral bruits. Abdomen: Bowel sounds are positive, abdomen extremely distended, with exquisite tenderness to palpation. Large well healed midline scar note. Msk:  Back normal, normal gait. Normal strength and tone for age. Extremities: No clubbing, cyanosis2=-3+ pitting edema noted. Neuro: Alert and oriented X 3. Psych:  Good affect, responds appropriately   Labs:   Lab Results  Component Value Date   WBC 8.4 10/29/2012   HGB 11.3* 10/29/2012   HCT 34.5* 10/29/2012   MCV 81.8 10/29/2012   PLT 271 10/29/2012   No results found for this basename: NA,K,CL,CO2,BUN,CREATININE,CALCIUM,LABALBU,PROT,BILITOT,ALKPHOS,ALT,AST,GLUCOSE in the last 168 hours Lab Results  Component Value Date   TROPONINI 1.15* 10/29/2012    No results found for this basename: CHOL   No results found for this basename: HDL   No results found for this basename: LDLCALC   No results found for this basename: TRIG   No results found for this basename: CHOLHDL   No results found for this basename: LDLDIRECT      Radiology: No results found. EKG:NSR, tachycardia, globally flattened T-waves, with PAC'[name] rate of 110 bpm.  ASSESSMENT AND PLAN:   1. Fluid Overload: Current unknown etiology. He may be having liver failure with ascites, no evidence of right heart failure or tendinitis noted. He is hypotensive here in the office and very edematous. He has a history of anemia. He has multiple medical problems to include Crohn'[name] disease. The patient will require greater than 2 days hospitalization for full assessment treatment and diagnostic testing. We will plan an abdominal ultrasound, a full echocardiogram to evaluate pericardial effusion place in step down for monitoring secondary to hypotension with positive pulses paradoxus. The patient has been seen and examined by myself and Dr. Molly [name] in the clinic who is  agreed with the plan to go to [facility] hospital for admission.  2. Pericardial Effusion: Most recent echo which was completed on 11/01/2002 revealing LV cavity size is normal LV function range of 65% percent wall motion was normal, pericardium yielded free-flowing circumferential pericardial effusion of moderate size. The fluid contained a moderate quantity of fibrinous strands. Patient had a echocardiogram here in the clinic in [place] office which did not show evidence of cardiac tampanade , but pericardial effusion remains prominent . The echocardiogram full assessment will be completed during hospitalization.   3. Liver fibrosis: Recommend GI consult for ongoing assessment of this. He is normally followed by Dr. Karilyn [name] in [place]. He has a history of Crohn'[name] disease, for which she is also being treated. CMET, abdominal ultrasound, and other assessment will be completed during admission.   Please see addendum to this note for Dr. Dietrich [name] comments.    Signed: Joni [name] 11/15/2012, 4:48 PM Co-Sign MD

## 2012-11-15 NOTE — Assessment & Plan Note (Signed)
This is still prominent per echocardigram, no evidence tampanade per echo completed in clinic room. He has substantial fluid overload and will need to be admitted for further observation, diuresis and testing. I have discussed this with the patient and his wife. They are willing and ready to have him admitted. He will go to [facility] in the step down unit.

## 2012-11-15 NOTE — Telephone Encounter (Signed)
I agree patient needs follow for Dr. wall as planned. He is to be seen today by his NP

## 2012-11-15 NOTE — Telephone Encounter (Signed)
Called pt and he advised that he did stop the Nsaid last week and it did help his left sided pain when coughing however yesterday he noted slight chest pain in the center of his chest when he coughs or breaths in deep, pt denies arm pain/chest pressure, also notes SOB upon exertion, please advise

## 2012-11-16 ENCOUNTER — Other Ambulatory Visit: Payer: Self-pay

## 2012-11-16 DIAGNOSIS — I319 Disease of pericardium, unspecified: Secondary | ICD-10-CM

## 2012-11-16 DIAGNOSIS — Z79899 Other long term (current) drug therapy: Secondary | ICD-10-CM

## 2012-11-16 DIAGNOSIS — I309 Acute pericarditis, unspecified: Secondary | ICD-10-CM

## 2012-11-16 DIAGNOSIS — R079 Chest pain, unspecified: Secondary | ICD-10-CM

## 2012-11-16 DIAGNOSIS — K7460 Unspecified cirrhosis of liver: Secondary | ICD-10-CM

## 2012-11-16 LAB — CBC
HCT: 27 % — ABNORMAL LOW (ref 39.0–52.0)
MCH: 26.5 pg (ref 26.0–34.0)
MCH: 25.8 pg — ABNORMAL LOW (ref 26.0–34.0)
MCHC: 33.6 g/dL (ref 30.0–36.0)
MCHC: 33 g/dL (ref 30.0–36.0)
MCV: 78.3 fL (ref 78.0–100.0)
Platelets: 256 10*3/uL (ref 150–400)
Platelets: 274 10*3/uL (ref 150–400)
RBC: 3.28 MIL/uL — ABNORMAL LOW (ref 4.22–5.81)
RDW: 19.6 % — ABNORMAL HIGH (ref 11.5–15.5)
WBC: 12.2 10*3/uL — ABNORMAL HIGH (ref 4.0–10.5)

## 2012-11-16 LAB — HIV ANTIBODY (ROUTINE TESTING W REFLEX): HIV: NONREACTIVE

## 2012-11-16 LAB — COMPREHENSIVE METABOLIC PANEL
Albumin: 1.8 g/dL — ABNORMAL LOW (ref 3.5–5.2)
Albumin: 1.9 g/dL — ABNORMAL LOW (ref 3.5–5.2)
Alkaline Phosphatase: 126 U/L — ABNORMAL HIGH (ref 39–117)
BUN: 27 mg/dL — ABNORMAL HIGH (ref 6–23)
BUN: 23 mg/dL (ref 6–23)
Calcium: 8.2 mg/dL — ABNORMAL LOW (ref 8.4–10.5)
Chloride: 96 mEq/L (ref 96–112)
Creatinine, Ser: 0.92 mg/dL (ref 0.50–1.35)
Creatinine, Ser: 0.76 mg/dL (ref 0.50–1.35)
GFR calc Af Amer: >90 mL/min (ref >90)
Glucose, Bld: 103 mg/dL — ABNORMAL HIGH (ref 70–99)
Potassium: 4.3 mEq/L (ref 3.5–5.1)
Total Bilirubin: 0.6 mg/dL (ref 0.3–1.2)
Total Protein: 5.4 g/dL — ABNORMAL LOW (ref 6.0–8.3)

## 2012-11-16 LAB — URINALYSIS, ROUTINE W REFLEX MICROSCOPIC
Bilirubin Urine: NEGATIVE
Ketones, ur: NEGATIVE mg/dL
Nitrite: NEGATIVE
Specific Gravity, Urine: 1.02 (ref 1.005–1.030)
Urobilinogen, UA: 0.2 mg/dL (ref 0.0–1.0)

## 2012-11-16 LAB — BLOOD GAS, ARTERIAL
Bicarbonate: 24 mEq/L (ref 20.0–24.0)
FIO2: 0.21 %
Patient temperature: 98.6
pCO2 arterial: 30.3 mmHg — ABNORMAL LOW (ref 35.0–45.0)
pH, Arterial: 7.51 — ABNORMAL HIGH (ref 7.350–7.450)

## 2012-11-16 LAB — APTT: aPTT: 38 seconds — ABNORMAL HIGH (ref 24–37)

## 2012-11-16 LAB — PROTIME-INR: INR: 1.31 (ref 0.00–1.49)

## 2012-11-16 MED ORDER — OXYCODONE HCL 5 MG PO TABS
10.0000 mg | ORAL_TABLET | ORAL | Status: DC
  Administered 2012-11-16 – 2012-11-17 (×6): 10 mg via ORAL
  Filled 2012-11-16 (×5): qty 2

## 2012-11-16 MED ORDER — OXYCODONE HCL 5 MG PO TABS: 10.0000 mg | ORAL_TABLET | Freq: Three times a day (TID) | ORAL | Status: DC

## 2012-11-16 MED ORDER — DEXTROSE 5 % IV SOLN
1.5000 g | INTRAVENOUS | Status: AC
  Administered 2012-11-17 (×2): 1.5 g via INTRAVENOUS
  Filled 2012-11-16 (×2): qty 1.5

## 2012-11-16 MED ORDER — SPIRONOLACTONE 50 MG PO TABS
50.0000 mg | ORAL_TABLET | Freq: Every day | ORAL | Status: DC
  Administered 2012-11-16: 50 mg via ORAL
  Filled 2012-11-16 (×2): qty 1

## 2012-11-16 MED ORDER — FERROUS SULFATE 325 (65 FE) MG PO TABS
325.0000 mg | ORAL_TABLET | Freq: Three times a day (TID) | ORAL | Status: DC
  Administered 2012-11-16 – 2012-11-20 (×9): 325 mg via ORAL
  Filled 2012-11-16 (×18): qty 1

## 2012-11-16 NOTE — Progress Notes (Signed)
Utilization Review Completed.   Ayme Short, RN, BSN Nurse Case Manager  [phone]  

## 2012-11-16 NOTE — Progress Notes (Signed)
Patient ID: FRIEND DORFMAN, male   DOB: [birth year], 36 y.o.   MRN: [id number]   Patient Name: David [name] Date of Encounter: 11/16/2012    SUBJECTIVE Patient known to me before. Presented several weeks ago to the emergency room with chest pain that sounded pericarditic. Treated with nonsteroidals for 2 weeks. Effusion remains. He however does not have any chest pain this morning.  He has anasarca which is probably multifactorial. Please see my clinic note from several weeks ago.  He was transferred down for GI consultation and evaluation as well as possible diagnostic pericardiocentesis.  CURRENT MEDS    . albuterol  2.5 mg Nebulization Q6H  . aspirin EC  81 mg Oral Daily  . docusate sodium  100 mg Oral BID  . sodium chloride  3 mL Intravenous Q12H  . [DISCONTINUED] aspirin EC  81 mg Oral Daily  . [DISCONTINUED] docusate sodium  100 mg Oral BID    OBJECTIVE  Filed Vitals:   11/15/12 1742 11/15/12 2200 11/16/12 0150 11/16/12 0644  BP: 89/46 98/53 94/44  99/48  Pulse: 108 107 103 97  Temp: 98.7 F (37.1 C) 99.8 F (37.7 C) 98.5 F (36.9 C) 98.1 F (36.7 C)  TempSrc: Oral Oral Oral Oral  Resp: 20 18 18 20   Height: 5\' 11"  (1.803 m)     Weight: 248 lb 1.6 oz (112.537 kg)   242 lb (109.77 kg)  SpO2: 96% 95% 92% 92%    Intake/Output Summary (Last 24 hours) at 11/16/12 0818 Last data filed at 11/16/12 0151  Gross per 24 hour  Intake      0 ml  Output    300 ml  Net   -300 ml   Filed Weights   11/15/12 1742 11/16/12 0644  Weight: 248 lb 1.6 oz (112.537 kg) 242 lb (109.77 kg)    PHYSICAL EXAM  General: Pleasant, NAD. Chronically ill-appearing Neuro: Alert and oriented X 3. Moves all extremities spontaneously. Psych: Normal affect. HEENT:  Normal  Neck: Supple without bruits or JVD. Lungs:  Resp regular and unlabored, CTA. Heart: RRR no s3, s4, or murmurs. There is no rub. May have summation gallop. Abdomen: Soft, non-tender, distended, bowel sounds  present. Extremities: No clubbing, 4+ pitting edema, DP/PT/Radials 2+ and equal bilaterally.  Accessory Clinical Findings  CBC  Basename 11/15/12 1844  WBC 14.7*  NEUTROABS 11.6*  HGB 8.7*  HCT 26.1*  MCV 79.3  PLT 239   Basic Metabolic Panel  Basename 11/16/12 0515 11/15/12 1844  NA 128* 128*  K 4.3 4.6  CL 95* 93*  CO2 24 26  GLUCOSE 103* 99  BUN 27* 28*  CREATININE 0.92 1.20  CALCIUM 8.0* 8.2*  MG -- 1.9  PHOS -- 3.8   Liver Function Tests  Basename 11/16/12 0515 11/15/12 1844  AST 44* 41*  ALT 18 19  ALKPHOS 126* 154*  BILITOT 0.6 0.5  PROT 5.4* 5.7*  ALBUMIN 1.8* 2.0*   No results found for this basename: LIPASE:2,AMYLASE:2 in the last 72 hours Cardiac Enzymes No results found for this basename: CKTOTAL:3,CKMB:3,CKMBINDEX:3,TROPONINI:3 in the last 72 hours BNP No components found with this basename: POCBNP:3 D-Dimer No results found for this basename: DDIMER:2 in the last 72 hours Hemoglobin A1C No results found for this basename: HGBA1C in the last 72 hours Fasting Lipid Panel No results found for this basename: CHOL,HDL,LDLCALC,TRIG,CHOLHDL,LDLDIRECT in the last 72 hours Thyroid Function Tests  Basename 11/15/12 1844  TSH 2.969  T4TOTAL --  T3FREE --  THYROIDAB --  TELE  Sinus rhythm and sinus tachycardia  ECG  Normal sinus rhythm, nonspecific T wave changes  Radiology/Studies  X-ray Chest Pa And Lateral   11/15/2012  *RADIOLOGY REPORT*  Clinical Data: Fever.  Pericarditis.  Crohn's disease.  CHEST - 2 VIEW  Comparison: 10/29/2012  Findings: Increased moderate left pleural effusion is seen with left lower lung atelectasis.  Cardiac enlargement again demonstrated.  Increased atelectasis seen in the right perihilar region.   Small pleural based opacity in the right lower hemithorax is stable.  IMPRESSION:  1.  Increased moderate left pleural effusion and left lower lung atelectasis. 2. Persistent cardiac enlargement and right lateral  pleural based opacity.   Original Report Authenticated By: Myles Rosenthal, M.D.    Dg Chest 2 View  10/29/2012  *RADIOLOGY REPORT*  Clinical Data: Chest pain.  CHEST - 2 VIEW  Comparison: 01/24/2009.  Findings: The cardiopericardial silhouette is enlarged.  The configuration suggests pericardial effusion.  Retrocardiac atelectasis is present.  Small left pleural effusion.  No airspace disease is present.  Scattered areas of atelectasis. Monitoring leads are projected over the chest.  On the frontal view, there is increased density along the right chest Daimion Adamcik with smooth margins.  This may represent pleural-based mass or loculated pleural fluid.  This pleural-based mass measures 75 mm x 26 mm.  Prior comparison exam was normal.  IMPRESSION: 1.  Enlargement of the cardiopericardial silhouette with configuration that suggests pericardial effusion.  Consider echocardiography. 2. Small left pleural effusion.  Pleural based opacity right lower chest may represent loculated pleural fluid or pleural mass. Consider follow-up chest CT with infusion for further assessment.   Original Report Authenticated By: Andreas Newport, M.D.    Ct Angio Chest W/cm &/or Wo Cm  10/29/2012  *RADIOLOGY REPORT*  Clinical Data: Chest pain, abnormal chest radiograph  CT ANGIOGRAPHY CHEST  Technique:  Multidetector CT imaging of the chest using the standard protocol during bolus administration of intravenous contrast. Multiplanar reconstructed images including MIPs were obtained and reviewed to evaluate the vascular anatomy.  Contrast:  100 ml Omnipaque-300 IV  Comparison: Chest radiographs dated 10/29/2012  Findings: No evidence of pulmonary embolism.  Small left pleural effusion.  Associated left lower lobe opacity, likely atelectasis.  No pneumothorax.  Visualized thyroid is unremarkable.  The heart is normal in size.  Moderate to large pericardial effusion.  Suspected faint enhancement of the parietal pericardium (series 4/image 47).  These  findings are worrisome for pericarditis.  Small mediastinal lymph nodes measuring up to 10 mm short axis (series 4/image 24).  No suspicious hilar or axillary lymphadenopathy.  Visualized upper abdomen is grossly unremarkable.  Very mild degenerative changes of the lower thoracic spine.  IMPRESSION: No evidence of pulmonary embolism.  Moderate to large pericardial effusion with suspected pericardial enhancement, worrisome for pericarditis.  Correlate for tamponade physiology.  Small left pleural effusion.  Associated left lower lobe atelectasis.   Original Report Authenticated By: Charline Bills, M.D.    US Abdomen Limited  11/16/2012  *RADIOLOGY REPORT*  Clinical Data: Ascites. Cirrhosis.  LIMITED ABDOMEN ULTRASOUND FOR ASCITES  Technique:  Limited ultrasound survey for ascites was performed in all four abdominal quadrants.  Comparison:  None.  Findings: Mild ascites is seen mainly in the left lower quadrant. In addition, a sizable left pleural effusion is demonstrated.  IMPRESSION: Mild ascites mainly in left lower quadrant.  Left pleural effusion also noted.   Original Report Authenticated By: Myles Rosenthal, M.D.     ASSESSMENT AND PLAN  Mr. Jessop presents with persistent pericardial effusion with improved chest pain. He's been treated with several weeks of nonsteroidal anti-inflammatory drugs. He has pleural effusion and pericardial effusion as well as ascites. This is clearly multifactorial. I am not certain that he has active pericarditis at the present time without pain or further EKG changes. In fact his EKG has always looked fairly unremarkable.  Plan GI consultation a day. I encouraged him to talk to Dr. Karilyn Cota who knows him well. Will hold off on pericardiocentesis for the present time. Am reluctant to give Lasix with his relative hypotension and increased BUN/creatinine ratio.  Signed, Valera Castle MD

## 2012-11-16 NOTE — Consult Note (Signed)
Reason for Consult:Pericardial and pleural effusions Referring Physician: Dr. Alvy Beal is an 36 y.o. male.  HPI: 36 yo WM with a history of Crohn's disease, who presents with a cc/o SOB, leg swelling and general malaise. He says he has had leg swelling dating back to August. In November he became ill with fevers and chills, chest pain and shortness of breath. He also noted progression of his peripheral edema. He was evaluated and found to have pericarditis. He was treated with NSAIDs which helped the pain and temporarily helped his SOB. Despite an increase in his Lasix dose he had progressive swelling and his SOB worsened. He also began having chest pain again, but not as severe as before. He was seen in the office yesterday and was found to have gained 25 pounds. He was found to have JVD and a friction rub. An echo in the office showed a large pericardial effusion but no evidence of tamponade. He had normal LV function. A CXR showed a large cardiac shadow and an increased left pleural effusion.  Past Medical History  Diagnosis Date  . 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 Hepatomegaly 05/04/2011  . Fatty liver 05/04/2011    "stage III fatty liver fibrosis" (11/15/2012)  . GERD (gastroesophageal reflux disease)   . Chronic liver disease     /notes 11/15/2012  . Pericarditis     [name] 11/15/2012  . Hypertension   . Pneumonia 1977  . Shortness of breath     "all the time right now" (11/15/2012)  . History of blood transfusion 2004  . History of stomach ulcers   . Duodenal ulcer   . Depression   . Kidney stones     bilaterally/notes 11/15/2012  . Hepatic fibrosis     [name] 11/15/2012  . Anemia   . Pericardial effusion 10/29/2012    moderate to large/notes 11/15/2012    Past Surgical History  Procedure Date  . Anal examination under anesthesia 02/11/2011    [name]  . Treatment fistula anal  07/03/11    This was a second surgery to repair Anal fistula  . Small intestine surgery 2004    "had hole cut in small intestines during endoscopy; had to have OR & leave me open 3 months" (11/15/2012)  . Cholecystectomy ~ 2003  . Appendectomy ~ 2003    Family History  Problem Relation Age of Onset  . Diabetes Mother   . Diabetes Brother   . Healthy Daughter   . Healthy Son     Social History:  reports that he has quit smoking. His smoking use included Cigarettes. He started smoking about 2 weeks ago. He has a 12 pack-year smoking history. His smokeless tobacco use includes Snuff. He reports that he does not drink alcohol or use illicit drugs.  Allergies:  Allergies  Allergen Reactions  . Morphine And Related Hives    Medications:  I have reviewed the patient's current medications. Prior to Admission:  Prescriptions prior to admission  Medication Sig Dispense Refill  . albuterol (PROVENTIL HFA;VENTOLIN HFA) 108 (90 BASE) MCG/ACT inhaler Inhale 2 puffs into the lungs every 6 (six) hours as needed. breathing      . dicyclomine (BENTYL) 20 MG tablet Take 20 mg by mouth 2 (two) times daily as needed. Stomach pains       . enalapril (VASOTEC) 10 MG tablet Take 10 mg by mouth daily.      Marland Kitchen  ferrous sulfate 325 (65 FE) MG tablet Take 325 mg by mouth 2 (two) times daily.       . fish oil-omega-3 fatty acids 1000 MG capsule Take 1 g by mouth 3 (three) times daily.      . furosemide (LASIX) 80 MG tablet Take 80 mg by mouth daily.      Marland Kitchen ibuprofen (ADVIL,MOTRIN) 200 MG tablet Take 400 mg by mouth every 6 (six) hours as needed. Fever      . mesalamine (PENTASA) 250 MG CR capsule Take 2,000 mg by mouth 2 (two) times daily.       . Multiple Vitamins-Minerals (MULTIVITAMINS THER. W/MINERALS) TABS Take 1 tablet by mouth daily.        . Oxycodone HCl 20 MG TABS Take 10 mg by mouth 3 (three) times daily. pain      . oxymorphone (OPANA ER, CRUSH RESISTANT,) 10 MG T12A 12 hr tablet Take 10 mg by  mouth every 12 (twelve) hours.      . pantoprazole (PROTONIX) 40 MG tablet Take 1 tablet (40 mg total) by mouth daily.  30 tablet  4  . potassium chloride (K-DUR) 10 MEQ tablet Take 20 mEq by mouth daily.      . Probiotic Product (ALIGN) 4 MG CAPS Take 4 mg by mouth daily.       . promethazine (PHENERGAN) 25 MG tablet Take 25 mg by mouth every 6 (six) hours as needed. Nausea/vomiting      . spironolactone (ALDACTONE) 50 MG tablet Take 50 mg by mouth daily.      . ursodiol (ACTIGALL) 500 MG tablet Take 500 mg by mouth 2 (two) times daily.        . [DISCONTINUED] potassium chloride (K-DUR) 10 MEQ tablet Take 2 tablets (20 mEq total) by mouth daily.  60 tablet  6  . [DISCONTINUED] spironolactone (ALDACTONE) 50 MG tablet Take 1 tablet (50 mg total) by mouth daily.  30 tablet  1    Results for orders placed during the hospital encounter of 11/15/12 (from the past 48 hour(s))  APTT     Status: Abnormal   Collection Time   11/15/12  6:44 PM      Component Value Range Comment   aPTT 44 (*) 24 - 37 seconds   CBC WITH DIFFERENTIAL     Status: Abnormal   Collection Time   11/15/12  6:44 PM      Component Value Range Comment   WBC 14.7 (*) 4.0 - 10.5 K/uL    RBC 3.29 (*) 4.22 - 5.81 MIL/uL    Hemoglobin 8.7 (*) 13.0 - 17.0 g/dL    HCT 45.4 (*) 09.8 - 52.0 %    MCV 79.3  78.0 - 100.0 fL    MCH 26.4  26.0 - 34.0 pg    MCHC 33.3  30.0 - 36.0 g/dL    RDW 11.9 (*) 14.7 - 15.5 %    Platelets 239  150 - 400 K/uL    Neutrophils Relative 79 (*) 43 - 77 %    Neutro Abs 11.6 (*) 1.7 - 7.7 K/uL    Lymphocytes Relative 8 (*) 12 - 46 %    Lymphs Abs 1.1  0.7 - 4.0 K/uL    Monocytes Relative 12  3 - 12 %    Monocytes Absolute 1.8 (*) 0.1 - 1.0 K/uL    Eosinophils Relative 1  0 - 5 %    Eosinophils Absolute 0.2  0.0 - 0.7 K/uL  Basophils Relative 0  0 - 1 %    Basophils Absolute 0.0  0.0 - 0.1 K/uL   COMPREHENSIVE METABOLIC PANEL     Status: Abnormal   Collection Time   11/15/12  6:44 PM      Component  Value Range Comment   Sodium 128 (*) 135 - 145 mEq/L    Potassium 4.6  3.5 - 5.1 mEq/L    Chloride 93 (*) 96 - 112 mEq/L    CO2 26  19 - 32 mEq/L    Glucose, Bld 99  70 - 99 mg/dL    BUN 28 (*) 6 - 23 mg/dL    Creatinine, Ser 4.78  0.50 - 1.35 mg/dL    Calcium 8.2 (*) 8.4 - 10.5 mg/dL    Total Protein 5.7 (*) 6.0 - 8.3 g/dL    Albumin 2.0 (*) 3.5 - 5.2 g/dL    AST 41 (*) 0 - 37 U/L    ALT 19  0 - 53 U/L    Alkaline Phosphatase 154 (*) 39 - 117 U/L    Total Bilirubin 0.5  0.3 - 1.2 mg/dL    GFR calc non Af Amer 76 (*) >90 mL/min    GFR calc Af Amer 89 (*) >90 mL/min   MAGNESIUM     Status: Normal   Collection Time   11/15/12  6:44 PM      Component Value Range Comment   Magnesium 1.9  1.5 - 2.5 mg/dL   PHOSPHORUS     Status: Normal   Collection Time   11/15/12  6:44 PM      Component Value Range Comment   Phosphorus 3.8  2.3 - 4.6 mg/dL   PROTIME-INR     Status: Abnormal   Collection Time   11/15/12  6:44 PM      Component Value Range Comment   Prothrombin Time 15.9 (*) 11.6 - 15.2 seconds    INR 1.30  0.00 - 1.49   TSH     Status: Normal   Collection Time   11/15/12  6:44 PM      Component Value Range Comment   TSH 2.969  0.350 - 4.500 uIU/mL   PRO B NATRIURETIC PEPTIDE     Status: Abnormal   Collection Time   11/15/12  6:45 PM      Component Value Range Comment   Pro B Natriuretic peptide (BNP) 1454.0 (*) 0 - 125 pg/mL   HIV ANTIBODY (ROUTINE TESTING)     Status: Normal   Collection Time   11/15/12 10:28 PM      Component Value Range Comment   HIV NON REACTIVE  NON REACTIVE   COMPREHENSIVE METABOLIC PANEL     Status: Abnormal   Collection Time   11/16/12  5:15 AM      Component Value Range Comment   Sodium 128 (*) 135 - 145 mEq/L    Potassium 4.3  3.5 - 5.1 mEq/L    Chloride 95 (*) 96 - 112 mEq/L    CO2 24  19 - 32 mEq/L    Glucose, Bld 103 (*) 70 - 99 mg/dL    BUN 27 (*) 6 - 23 mg/dL    Creatinine, Ser 2.95  0.50 - 1.35 mg/dL    Calcium 8.0 (*) 8.4 - 10.5 mg/dL     Total Protein 5.4 (*) 6.0 - 8.3 g/dL    Albumin 1.8 (*) 3.5 - 5.2 g/dL    AST 44 (*) 0 - 37 U/L  ALT 18  0 - 53 U/L    Alkaline Phosphatase 126 (*) 39 - 117 U/L    Total Bilirubin 0.6  0.3 - 1.2 mg/dL    GFR calc non Af Amer >90  >90 mL/min    GFR calc Af Amer >90  >90 mL/min   CBC     Status: Abnormal   Collection Time   11/16/12  9:20 AM      Component Value Range Comment   WBC 9.7  4.0 - 10.5 K/uL    RBC 3.28 (*) 4.22 - 5.81 MIL/uL    Hemoglobin 8.7 (*) 13.0 - 17.0 g/dL    HCT 16.1 (*) 09.6 - 52.0 %    MCV 79.0  78.0 - 100.0 fL    MCH 26.5  26.0 - 34.0 pg    MCHC 33.6  30.0 - 36.0 g/dL    RDW 04.5 (*) 40.9 - 15.5 %    Platelets 256  150 - 400 K/uL     X-ray Chest Pa And Lateral   11/15/2012  *RADIOLOGY REPORT*  Clinical Data: Fever.  Pericarditis.  Crohn's disease.  CHEST - 2 VIEW  Comparison: 10/29/2012  Findings: Increased moderate left pleural effusion is seen with left lower lung atelectasis.  Cardiac enlargement again demonstrated.  Increased atelectasis seen in the right perihilar region.   Small pleural based opacity in the right lower hemithorax is stable.  IMPRESSION:  1.  Increased moderate left pleural effusion and left lower lung atelectasis. 2. Persistent cardiac enlargement and right lateral pleural based opacity.   Original Report Authenticated By: Myles Rosenthal, M.D.    US Abdomen Limited  11/16/2012  *RADIOLOGY REPORT*  Clinical Data: Ascites. Cirrhosis.  LIMITED ABDOMEN ULTRASOUND FOR ASCITES  Technique:  Limited ultrasound survey for ascites was performed in all four abdominal quadrants.  Comparison:  None.  Findings: Mild ascites is seen mainly in the left lower quadrant. In addition, a sizable left pleural effusion is demonstrated.  IMPRESSION: Mild ascites mainly in left lower quadrant.  Left pleural effusion also noted.   Original Report Authenticated By: Myles Rosenthal, M.D.     Review of Systems  Constitutional: Positive for fever (with original illness 6 weeks  ago), chills and malaise/fatigue.  Respiratory: Positive for cough and shortness of breath.   Cardiovascular: Positive for chest pain (mild at present), orthopnea and leg swelling.  Gastrointestinal: Positive for abdominal pain.  Neurological: Positive for weakness.  All other systems reviewed and are negative.   Blood pressure 110/49, pulse 107, temperature 98.6 F (37 C), temperature source Oral, resp. rate 20, height 5\' 11"  (1.803 m), weight 242 lb (109.77 kg), SpO2 94.00%. Physical Exam  Vitals reviewed. Constitutional: He is oriented to person, place, and time. He appears well-developed and well-nourished. No distress.  HENT:  Head: Normocephalic and atraumatic.  Eyes: EOM are normal. Pupils are equal, round, and reactive to light.  Neck: Neck supple. JVD present. No thyromegaly present.  Cardiovascular: Exam reveals friction rub.        Diminished heart sounds  Respiratory: He has no wheezes. He has no rales.       Diminished BS left base  GI: Soft. He exhibits distension. There is no tenderness.  Musculoskeletal: He exhibits edema (4+ bilateral LE).  Lymphadenopathy:    He has no cervical adenopathy.  Neurological: He is alert and oriented to person, place, and time. No cranial nerve deficit.  Skin: Skin is warm and dry. There is pallor.  Psychiatric: He has  a normal mood and affect.    Assessment/Plan: 36 yo WM with a persistent pericardial effusion and moderate left pleural effusion as well as generalized anasarca. He is not in frank tamponade, but is relatively hypotensive which has limited the ability to diurese him.  He may benefit from pericardial window both diagnostically and therapeutically. He does understand that there is not a guarantee that evacuating the pericardial effusion will decrease his peripheral edema. And there is a possibility of a recurrence. He also understands there is a possibility that we will not obtain a definitive diagnosis.  I recommended  that we proceed with a left VATS to drain the left pleural effusion and do a pericardial window to drain the pericardial effusion. I have discussed with him the general nature of the procedure, need for general anesthesia, and incisions to be used. We discussed the expected hospital stay, overall recovery and short and long term outcomes. He understands the risks include but are not limited to death, MI, DVT/PE, bleeding, possible need for transfusion, infections, cardiac or lung injury, and other organ system dysfunction including respiratory, renal, or GI complications. He accepts these risks and agrees to proceed.  For OR in AM  Othella Slappey C 11/16/2012, 6:01 PM

## 2012-11-16 NOTE — Consult Note (Signed)
[facility] Consult: 11:56 AM 11/16/2012   Referring Provider: Dr Daleen Squibb Primary Care Physician:  [name], MD Primary Gastroenterologist:  Dr. Karilyn Cota in [place]  Reason for Consultation:  Anasarca, Crohns dz.   HPI: David [name] is a 36 y.o. male.  Recent course of NSAIDs for chest pain thought to be pericarditis. Chest pain resolved but still has pericardial effusion. Treated in early November with Levaquin by primary MD for lymphadenitis.  He has "multifactorial" anasarca. Admitted to [facility] with fluid retention, weight gain of at least 25 pounds, despite increased doses of Lasix, SOB, PND, fatigue, hypotension,  Diuretics at arrival were Aldactone 50 mg daily and Furosemide 80 mg daily.   Weight is down from 112.5 kg to 109.7 Kg since admission.  Continues on same dose of Aldactone but no Furosemide.  Per Dr Daleen Squibb:  "Am reluctant to give Lasix with his relative hypotension and increased BUN/creatinine ratio.  Complicated GI history, followed closely by Dr Karilyn Cota.  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  Last GI Ov was 10/31/2012 for follow up of BRBPR and abdominal pain.  The notes mention " Chronic abdominal pain with dependence on narcotics presently managed by Dr. [name]".  Multiple rectal surgeries for perirectal fistulas.  Has ileo colonic fustula as well. MRI enterography on 10/03/2012 showed changes of active Crohn disease involving terminal ileum.  Treated with Budesonide 9 mg daily for 2 weeks dropping to 6 mg daily thereafter. Dose was to decrease to 3mg  around 11/23.  Was having 1 to 2 formed, non-bloody BMs daily, weight had gone up 5 #.  Last dose of infliximab was on 09/12/2012.  A dose was due in late November but was held due to the problems with edema.  Methotrexate d/cd due to liver disease and has been treated in past with  Cimzia and Humira.  On stable doses of Pentasa and Remicade currently.  Hepatitis B and C serologies are negative.  Has had narcotics abuse and chronic pain issues with referral for out pt [facility] detox in 2012.  Had 3 month hospital stay at [facility] in around 2000 after SB perf'd during EGD, laparotomy wound left open and healed by seconday intention. He received transfusions of blood then but not since.  Takes po Iron chronically.   No nausea, no abd pain.  Just occasional BPR with wiping.   ENDOSCOPIC STUDIES: 2004 ERCP with sphinct and removal of stones/sludge.  Duod ulcers noted felt to be from Crohns  2004  EGD  Ulcerations c/w Crohns in D2 and 3.   Did not find a colonoscopy in the Epic records. Pt says it has been several years.        Past Medical History  Diagnosis Date  . 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 Hepatomegaly 05/04/2011  . Fatty liver 05/04/2011    "stage III fatty liver fibrosis" (11/15/2012)  . GERD (gastroesophageal reflux disease)   . Chronic liver disease     /notes 11/15/2012  . Pericarditis     [name] 11/15/2012  . Hypertension   . Pneumonia 1977  . Shortness of breath     "all the time right now" (11/15/2012)  . History of blood transfusion 2004  . History of stomach ulcers   . Duodenal ulcer   . Depression   . Kidney stones     bilaterally/notes  11/15/2012  . Hepatic fibrosis     [name] 11/15/2012  . Anemia   . Pericardial effusion 10/29/2012    moderate to large/notes 11/15/2012    Past Surgical History  Procedure Date  . Anal examination under anesthesia 02/11/2011    [name]  . Treatment fistula anal 07/03/11    This was a second surgery to repair Anal fistula  . Small intestine surgery 2004    "had hole cut in small intestines during endoscopy; had to have OR & leave me open 3 months" (11/15/2012)  . Cholecystectomy ~ 2003  . Appendectomy ~ 2003     Prior to Admission medications   Medication Sig Start Date End Date Taking? Authorizing Provider  albuterol (PROVENTIL HFA;VENTOLIN HFA) 108 (90 BASE) MCG/ACT inhaler Inhale 2 puffs into the lungs every 6 (six) hours as needed. breathing   Yes Historical Provider, MD  dicyclomine (BENTYL) 20 MG tablet Take 20 mg by mouth 2 (two) times daily as needed. Stomach pains  07/24/11  Yes Malissa Hippo, MD  enalapril (VASOTEC) 10 MG tablet Take 10 mg by mouth daily.   Yes Historical Provider, MD  ferrous sulfate 325 (65 FE) MG tablet Take 325 mg by mouth 2 (two) times daily.    Yes Historical Provider, MD  fish oil-omega-3 fatty acids 1000 MG capsule Take 1 g by mouth 3 (three) times daily. 02/09/12  Yes Malissa Hippo, MD  furosemide (LASIX) 80 MG tablet Take 80 mg by mouth daily.   Yes Historical Provider, MD  ibuprofen (ADVIL,MOTRIN) 200 MG tablet Take 400 mg by mouth every 6 (six) hours as needed. Fever   Yes Historical Provider, MD  mesalamine (PENTASA) 250 MG CR capsule Take 2,000 mg by mouth 2 (two) times daily.    Yes Historical Provider, MD  Multiple Vitamins-Minerals (MULTIVITAMINS THER. W/MINERALS) TABS Take 1 tablet by mouth daily.     Yes Historical Provider, MD  Oxycodone HCl 20 MG TABS Take 10 mg by mouth 3 (three) times daily. pain   Yes Historical Provider, MD  oxymorphone (OPANA) 10 MG tablet Take 10 mg by mouth 2 (two) times daily.   Yes Historical Provider, MD  pantoprazole (PROTONIX) 40 MG tablet Take 1 tablet (40 mg total) by mouth daily. 08/03/12  Yes Len Blalock, NP  potassium chloride (K-DUR) 10 MEQ tablet Take 20 mEq by mouth daily. 11/02/12 11/02/13 Yes Gaylord Shih, MD  Probiotic Product (ALIGN) 4 MG CAPS Take 4 mg by mouth daily.    Yes Historical Provider, MD  promethazine (PHENERGAN) 25 MG tablet Take 25 mg by mouth every 6 (six) hours as needed. Nausea/vomiting   Yes Historical Provider, MD  spironolactone (ALDACTONE) 50 MG tablet Take 50 mg by mouth daily. 10/31/12   Yes Malissa Hippo, MD  ursodiol (ACTIGALL) 500 MG tablet Take 500 mg by mouth 2 (two) times daily.     Yes Historical Provider, MD    Scheduled Meds:    . albuterol  2.5 mg Nebulization Q6H  . aspirin EC  81 mg Oral Daily  . docusate sodium  100 mg Oral BID  . ferrous sulfate  325 mg Oral TID WC  . sodium chloride  3 mL Intravenous Q12H  . spironolactone  50 mg Oral Daily  . [DISCONTINUED] aspirin EC  81 mg Oral Daily  . [DISCONTINUED] docusate sodium  100 mg Oral BID   Infusions:   PRN Meds: sodium chloride, acetaminophen, acetaminophen, alum & mag hydroxide-simeth, ondansetron (ZOFRAN) IV,  ondansetron, oxyCODONE, promethazine, zolpidem, [DISCONTINUED]  morphine injection, [DISCONTINUED] oxyCODONE   Allergies as of 11/15/2012 - Review Complete 11/15/2012  Allergen Reaction Noted  . Morphine and related Hives 07/03/2011    Family History  Problem Relation Age of Onset  . Diabetes Mother   . Diabetes Brother   . Healthy Daughter   . Healthy Son     History   Social History  . Marital Status: Married    Spouse Name: N/A    Number of Children: N/A  . Years of Education: N/A   Occupational History  . Not on file.   Social History Main Topics  . Smoking status: Former Smoker -- 0.5 packs/day for 24 years    Types: Cigarettes    Start date: 10/28/2012  . Smokeless tobacco: Current User    Types: Snuff     Comment: 11/15/2012 "quit smoking cigarettes ~ 1 month ago"  . Alcohol Use: No  . Drug Use: No  . Sexually Active: Not Currently   Other Topics Concern  . Not on file   Social History Narrative  . No narrative on file    REVIEW OF SYSTEMS: Constitutional:  Weak, tired ENT:  No nose bleeds Pulm:  PND, edema to knees,  CV:  No chest pain or palps.  LE edema is better GU:  Resolved scrotal edema.  No amber urine GI:  As per HPI.   Heme:  Per HPI.  No unusual bleeding or bruising.    Transfusions:  Per HPI Neuro:  No headache, dizziness or falls.    Derm:  Non pruritic, small nodules have appeared on palms, they do not bleed and can be peeled away, leaving behind dry cracked skin. Endocrine:  No excessive thirst, no chills or sweats Immunization:  Flu shot current Travel:  none   PHYSICAL EXAM: Vital signs in last 24 hours: Temp:  [98.1 F (36.7 C)-99.8 F (37.7 C)] 98.1 F (36.7 C) (12/04 0644) Pulse Rate:  [97-110] 109  (12/04 1107) Resp:  [18-20] 20  (12/04 0644) BP: (89-114)/(44-58) 114/51 mmHg (12/04 1107) SpO2:  [91 %-96 %] 92 % (12/04 0818) Weight:  [109.77 kg (242 lb)-112.537 kg (248 lb 1.6 oz)] 109.77 kg (242 lb) (12/04 0644)  General: looks unwell, not toxic.  comfortable Head:  No asymmetry or facial edema  Eyes:  No icterus, conjunctiva are pale Ears:  Not HOH  Nose:  No discharge or sneezing Mouth:  Clear, moist, pink MM Neck:  No JVD or bruits.  No TMG or mass.  Lungs:  BS at right base are absent.  Pt is dyspneic with speech and minor effort Heart: RRR.  No MRG S1 and S2 increased, hyperdynamic Abdomen:  Soft, NT, distended with likely ascites  Edge like shelf (of liver?) palpable in the RLQ.  Not tender.   Rectal: deferred   Musc/Skeltl: no joint erythema or swelling Extremities:  1 to 2 + LE edema into thighs. GU:  No scrotal edema .  No gynecomastia.  Neurologic:  Pleasant, fine tremor in hands, not clearly asterixis.  Good historian, no confusion. Skin:  Cracks in skin of palms and finger.  A few spider angioma at back of neck.  No palmar erythema Tattoos:  None seen Nodes:  No inguinal adenopathy.    Psych:  Pleasant, quiet, cooperative.   Intake/Output from previous day: 12/03 0701 - 12/04 0700 In: -  Out: 300 [Urine:300] Intake/Output this shift: Total I/O In: -  Out: 700 [Urine:700]  LAB RESULTS:  Basename 11/16/12 0920 11/15/12 1844  WBC 9.7 14.7*  HGB 8.7* 8.7*  HCT 25.9* 26.1*  PLT 256 239  Hgb was 8.6 09/06/12,  11.3 on 10/29/12 MCV is 79   BMET Lab Results  Component Value  Date   NA 128* 11/16/2012   NA 128* 11/15/2012   NA 134* 10/29/2012   K 4.3 11/16/2012   K 4.6 11/15/2012   K 4.5 10/29/2012   CL 95* 11/16/2012   CL 93* 11/15/2012   CL 97 10/29/2012   CO2 24 11/16/2012   CO2 26 11/15/2012   CO2 27 10/29/2012   GLUCOSE 103* 11/16/2012   GLUCOSE 99 11/15/2012   GLUCOSE 104* 10/29/2012   BUN 27* 11/16/2012   BUN 28* 11/15/2012   BUN 10 10/29/2012   CREATININE 0.92 11/16/2012   CREATININE 1.20 11/15/2012   CREATININE 0.70 10/29/2012   CALCIUM 8.0* 11/16/2012   CALCIUM 8.2* 11/15/2012   CALCIUM 9.4 10/29/2012   LFT  Basename 11/16/12 0515 11/15/12 1844  PROT 5.4* 5.7*  ALBUMIN 1.8* 2.0*  AST 44* 41*  ALT 18 19  ALKPHOS 126* 154*  BILITOT 0.6 0.5  BILIDIR -- --  IBILI -- --   PT/INR Lab Results  Component Value Date   INR 1.30 11/15/2012   INR 1.11 09/06/2012       PT                           15.9                                                                11/15/12     Hepatitis Panel  Ref. Range 07/21/2011 12:05 11/15/2012 22:28  Hepatitis B Surface Ag Latest Range: NEGATIVE  NEGATIVE   Hep B S Ab Latest Range: NEGATIVE  POS (A)   HCV Ab Latest Range: NEGATIVE  NEGATIVE   HIV Latest Range: NON REACTIVE   NON REACTIVE      RADIOLOGY STUDIES: X-ray Chest Pa And Lateral  11/15/2012  *RADIOLOGY REPORT*  Clinical Data: Fever.  Pericarditis.  Crohn's disease.  CHEST - 2 VIEW  Comparison: 10/29/2012  Findings: Increased moderate left pleural effusion is seen with left lower lung atelectasis.  Cardiac enlargement again demonstrated.  Increased atelectasis seen in the right perihilar region.   Small pleural based opacity in the right lower hemithorax is stable.  IMPRESSION:  1.  Increased moderate left pleural effusion and left lower lung atelectasis. 2. Persistent cardiac enlargement and right lateral pleural based opacity.   Original Report Authenticated By: Myles Rosenthal, M.D.    US Abdomen Limited 11/16/2012  *RADIOLOGY REPORT*  Clinical Data: Ascites.  Cirrhosis.  LIMITED ABDOMEN ULTRASOUND FOR ASCITES  Technique:  Limited ultrasound survey for ascites was performed in all four abdominal quadrants.  Comparison:  None.  Findings: Mild ascites is seen mainly in the left lower quadrant. In addition, a sizable left pleural effusion is demonstrated.  IMPRESSION: Mild ascites mainly in left lower quadrant.  Left pleural effusion also noted.   Original Report Authenticated By: Myles Rosenthal, M.D.    CT abdomen pelvis with contrast 08/21/2011 IMPRESSION:  1. New stigma of liver failure/portal venous hypertension:  diffusely congested mesentery, small volume ascites, and new  varices in the ventral mesentery and ventral abdominal wall.  2. Portal venous system and other major arterial and venous  structures remain patent.  3. Essentially stable circumferential inflammatory change of the  terminal ileum likely related to the patient's underlying Crohn  disease. No other new areas of bowel inflammation identified.  MR ABDOMEN AND PELVIS WITHOUT AND WITH CONTRAST (MR ENTEROGRAPHY) 09/27/2012 IMPRESSION:  1.5 x 2.0 cm hypoenhancing lesion in the anterior segment right  hepatic lobe, indeterminate. In the setting of possible cirrhosis,  follow-up liver-protocol MRI is suggested in 6 months. If a more  aggressive approach is desired, ultrasound-guided biopsy may be  possible.  Suspected small gastroesophageal varices and splenomegaly,  suggesting portal hypertension. IMPRESSSION:  Findings compatible with active inflammatory Crohn's disease  involving the terminal ileum. No fixed narrowing or stricture. No  findings to suggest fistula or abscess.     IMPRESSION: *  Long standing SB and rectal Crohn's. - on immunosuppressive therapy including Remicade and other biologic agents - increasing risk of atypical infection and malignancy *  Fatty liver, likely cirrhosis given findings of 09/2012 MRI:  ? GE varices and splenomegaly. Coags normal. - thought  due to methotrexate  *  Ascites, anasarca volume overload, weight gain.  *  Right liver lesion, 9/24 AFP level 4.0  - DOES NOT RULE OUT DYSPLASTIC LESION OR CARCINOMA - but radiologist thought probably present on 2012 CT so stability favors benign *  Anemia.  On po Iron PTA  *  Chronic abdominal pain, stable on narcotics.  PERICARDIAL EFFUSION *  ? Pericarditis, per Dr Daleen Squibb: "not certain that he has active pericarditis at the present time without pain or further EKG changes. In fact his EKG has always looked fairly unremarkable" *  Protein malnutrition *  Hyponatremia *  Azotemia with normal creatinine.   PLAN: *  Per Dr Leone Payor   LOS: 1 day   Jennye Moccasin  11/16/2012, 11:56 AM Pager: [phone]   [facility] Attending  I have also seen and assessed the patient and agree with the above note - some additions made. Images and old records viewed.  Complex young male patient with Crohn's disease and hx of surgeries and long-standing appropriate immunosuppressive therapy including biologic tx (Remicade of late). 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  Await echo Send TB quantiferon gold and ANA tests Will follow-up  Iva Boop, MD, [facility] [phone] (pager) 11/16/2012 5:08 PM

## 2012-11-16 NOTE — Progress Notes (Signed)
INITIAL ADULT NUTRITION ASSESSMENT Date: 11/16/2012   Time: 10:08 AM Reason for Assessment: Consult   INTERVENTION: 1. RD will follow po intake and add nutrition supplements as needed   DOCUMENTATION CODES Per approved criteria  -Obesity Unspecified     ASSESSMENT: Male 36 y.o.  Dx: Fluid overload, pericardial effusion  Hx:  Past Medical History  Diagnosis Date  . 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 Hepatomegaly 05/04/2011  . Fatty liver 05/04/2011    "stage III fatty liver fibrosis" (11/15/2012)  . GERD (gastroesophageal reflux disease)   . Chronic liver disease     /notes 11/15/2012  . Pericarditis     [name] 11/15/2012  . Hypertension   . Pneumonia 1977  . Shortness of breath     "all the time right now" (11/15/2012)  . History of blood transfusion 2004  . History of stomach ulcers   . Duodenal ulcer   . Depression   . Kidney stones     bilaterally/notes 11/15/2012  . Hepatic fibrosis     [name] 11/15/2012  . Anemia   . Pericardial effusion 10/29/2012    moderate to large/notes 11/15/2012    Past Surgical History  Procedure Date  . Anal examination under anesthesia 02/11/2011    [name]  . Treatment fistula anal 07/03/11    This was a second surgery to repair Anal fistula  . Small intestine surgery 2004    "had hole cut in small intestines during endoscopy; had to have OR & leave me open 3 months" (11/15/2012)  . Cholecystectomy ~ 2003  . Appendectomy ~ 2003     Related Meds:     . albuterol  2.5 mg Nebulization Q6H  . aspirin EC  81 mg Oral Daily  . docusate sodium  100 mg Oral BID  . sodium chloride  3 mL Intravenous Q12H  . [DISCONTINUED] aspirin EC  81 mg Oral Daily  . [DISCONTINUED] docusate sodium  100 mg Oral BID     Ht: 5\' 11"  (180.3 cm)  Wt: 242 lb (109.77 kg) (scale C)  Ideal Wt: 78.2 kg  % Ideal Wt: 140%  Usual Wt:  Wt Readings from Last 5  Encounters:  11/16/12 242 lb (109.77 kg)  11/15/12 246 lb (111.585 kg)  11/02/12 238 lb (107.956 kg)  10/31/12 221 lb 9.6 oz (100.517 kg)  10/29/12 225 lb (102.059 kg)  220 lbs per pt report   % Usual Wt: 110%  Body mass index is 33.75 kg/(m^2). obesity class 1  Food/Nutrition Related Hx: Pt reports several days of only taking water and sports drinks, no food   Labs:  CMP     Component Value Date/Time   NA 128* 11/16/2012 0515   K 4.3 11/16/2012 0515   CL 95* 11/16/2012 0515   CO2 24 11/16/2012 0515   GLUCOSE 103* 11/16/2012 0515   BUN 27* 11/16/2012 0515   CREATININE 0.92 11/16/2012 0515   CREATININE 0.68 10/04/2012 0130   CALCIUM 8.0* 11/16/2012 0515   PROT 5.4* 11/16/2012 0515   ALBUMIN 1.8* 11/16/2012 0515   AST 44* 11/16/2012 0515   ALT 18 11/16/2012 0515   ALKPHOS 126* 11/16/2012 0515   BILITOT 0.6 11/16/2012 0515   GFRNONAA >90 11/16/2012 0515   GFRAA >90 11/16/2012 0515    Intake/Output Summary (Last 24 hours) at 11/16/12 1011 Last data filed at 11/16/12 0800  Gross per 24 hour  Intake  0 ml  Output   1000 ml  Net  -1000 ml     Diet Order: Gluten Restricted  Supplements/Tube Feeding: none   IVF:    Estimated Nutritional Needs:   Kcal: 2100-2300 Protein: 80-90 gm  Fluid:  2.1-2.3 L   RD consulted for assessment of nutrition status and needs. Pt admitted with volume overload. Has a hx of Crohn's and follows a gluten free diet at home.  Pt with low Albumin, likely related to increased fluids.  PO intake of fluids was only a few glasses per day per pts report, for the past 3-4 days.  RD expects appetite will improve with fluid removal. Will follow and add nutrition supplements if PO intake not adequate.   Advised pt that this facility does not stock gluten free bread, should any bread product be on his try he should avoid eating it. Also, all stocked nutrition supplements are gluten free.   NUTRITION DIAGNOSIS: Inadequate oral intake r/t poor appetite AEB  consuming only liquids x 3-4 days  MONITORING/EVALUATION(Goals): Goal: PO intake to meet >/=90% estimated nutrition needs  Monitor: PO intake, weight, labs, I/O's  EDUCATION NEEDS: -No education needs identified at this time   Clarene Duke RD, LDN Pager [phone] After Hours pager [phone]  11/16/2012, 10:08 AM

## 2012-11-16 NOTE — Progress Notes (Signed)
  Echocardiogram 2D Echocardiogram has been performed.  David [name] 11/16/2012, 11:58 AM

## 2012-11-17 ENCOUNTER — Encounter (HOSPITAL_COMMUNITY): Payer: Self-pay | Admitting: Anesthesiology

## 2012-11-17 ENCOUNTER — Encounter (HOSPITAL_COMMUNITY)
Admission: AD | Disposition: A | Discharge status: Home / Self Care | Payer: Self-pay | Source: Ambulatory Visit | Attending: Thoracic Surgery (Cardiothoracic Vascular Surgery)

## 2012-11-17 ENCOUNTER — Inpatient Hospital Stay (HOSPITAL_COMMUNITY): Payer: Medicare Other | Admitting: Anesthesiology

## 2012-11-17 ENCOUNTER — Inpatient Hospital Stay (HOSPITAL_COMMUNITY): Payer: Medicare Other

## 2012-11-17 DIAGNOSIS — J90 Pleural effusion, not elsewhere classified: Secondary | ICD-10-CM

## 2012-11-17 DIAGNOSIS — I309 Acute pericarditis, unspecified: Secondary | ICD-10-CM

## 2012-11-17 HISTORY — PX: PERICARDIAL WINDOW: SHX2213

## 2012-11-17 HISTORY — PX: VIDEO ASSISTED THORACOSCOPY: SHX5073

## 2012-11-17 LAB — PROTEIN, BODY FLUID
Total protein, fluid: 2.4 g/dL
Total protein, fluid: 4.4 g/dL

## 2012-11-17 LAB — BODY FLUID CELL COUNT WITH DIFFERENTIAL
Eos, Fluid: 3 %
Lymphs, Fluid: 32 %
Monocyte-Macrophage-Serous Fluid: 2 % — ABNORMAL LOW (ref 50–90)
Neutrophil Count, Fluid: 51 % — ABNORMAL HIGH (ref 0–25)
Neutrophil Count, Fluid: 65 % — ABNORMAL HIGH (ref 0–25)
Total Nucleated Cell Count, Fluid: 844 cu mm (ref 0–1000)
Total Nucleated Cell Count, Fluid: 3030 cu mm — ABNORMAL HIGH (ref 0–1000)

## 2012-11-17 LAB — URINE CULTURE: Culture: NO GROWTH

## 2012-11-17 LAB — ANA: Anti Nuclear Antibody(ANA): NEGATIVE

## 2012-11-17 LAB — BASIC METABOLIC PANEL
Calcium: 8.5 mg/dL (ref 8.4–10.5)
GFR calc non Af Amer: >90 mL/min (ref >90)
Potassium: 4.6 mEq/L (ref 3.5–5.1)
Sodium: 132 mEq/L — ABNORMAL LOW (ref 135–145)

## 2012-11-17 LAB — PH, BODY FLUID: pH, Fluid: 8

## 2012-11-17 LAB — ABO/RH: ABO/RH(D): A POS

## 2012-11-17 SURGERY — VIDEO ASSISTED THORACOSCOPY
Anesthesia: General | Site: Chest | Wound class: Clean Contaminated

## 2012-11-17 MED ORDER — SPIRONOLACTONE 50 MG PO TABS
50.0000 mg | ORAL_TABLET | Freq: Every day | ORAL | Status: DC
  Administered 2012-11-18 – 2012-11-22 (×5): 50 mg via ORAL
  Filled 2012-11-17 (×5): qty 1

## 2012-11-17 MED ORDER — ALBUMIN HUMAN 5 % IV SOLN
INTRAVENOUS | Status: AC
  Filled 2012-11-17: qty 250

## 2012-11-17 MED ORDER — DIPHENHYDRAMINE HCL 50 MG/ML IJ SOLN
12.5000 mg | Freq: Four times a day (QID) | INTRAMUSCULAR | Status: DC | PRN
  Filled 2012-11-17: qty 0.25

## 2012-11-17 MED ORDER — NALOXONE HCL 0.4 MG/ML IJ SOLN
0.4000 mg | INTRAMUSCULAR | Status: DC | PRN
  Filled 2012-11-17: qty 1

## 2012-11-17 MED ORDER — URSODIOL 500 MG PO TABS: 500.0000 mg | ORAL_TABLET | Freq: Two times a day (BID) | ORAL | Status: DC

## 2012-11-17 MED ORDER — SENNOSIDES-DOCUSATE SODIUM 8.6-50 MG PO TABS
1.0000 | ORAL_TABLET | Freq: Every evening | ORAL | Status: DC | PRN
  Filled 2012-11-17: qty 1

## 2012-11-17 MED ORDER — LIDOCAINE HCL (CARDIAC) 20 MG/ML IV SOLN
INTRAVENOUS | Status: DC | PRN
  Administered 2012-11-17: 100 mg via INTRAVENOUS

## 2012-11-17 MED ORDER — OXYCODONE-ACETAMINOPHEN 5-325 MG PO TABS
1.0000 | ORAL_TABLET | ORAL | Status: DC | PRN
  Administered 2012-11-18 – 2012-11-22 (×20): 2 via ORAL
  Filled 2012-11-17 (×20): qty 2

## 2012-11-17 MED ORDER — ONDANSETRON HCL 4 MG/2ML IJ SOLN
4.0000 mg | Freq: Four times a day (QID) | INTRAMUSCULAR | Status: DC | PRN
  Filled 2012-11-17: qty 2

## 2012-11-17 MED ORDER — PROPOFOL 10 MG/ML IV BOLUS
INTRAVENOUS | Status: DC | PRN
  Administered 2012-11-17: 50 mg via INTRAVENOUS
  Administered 2012-11-17: 200 mg via INTRAVENOUS

## 2012-11-17 MED ORDER — KETOROLAC TROMETHAMINE 30 MG/ML IJ SOLN
30.0000 mg | Freq: Three times a day (TID) | INTRAMUSCULAR | Status: DC
  Administered 2012-11-17 – 2012-11-18 (×3): 30 mg via INTRAVENOUS
  Filled 2012-11-17 (×6): qty 1

## 2012-11-17 MED ORDER — VECURONIUM BROMIDE 10 MG IV SOLR
INTRAVENOUS | Status: DC | PRN
  Administered 2012-11-17: 4 mg via INTRAVENOUS
  Administered 2012-11-17: 2 mg via INTRAVENOUS

## 2012-11-17 MED ORDER — DEXTROSE 5 % IV SOLN
1.5000 g | Freq: Two times a day (BID) | INTRAVENOUS | Status: AC
  Administered 2012-11-17 – 2012-11-18 (×2): 1.5 g via INTRAVENOUS
  Filled 2012-11-17 (×2): qty 1.5

## 2012-11-17 MED ORDER — ONDANSETRON HCL 4 MG/2ML IJ SOLN
INTRAMUSCULAR | Status: DC | PRN
  Administered 2012-11-17: 4 mg via INTRAVENOUS

## 2012-11-17 MED ORDER — DEXTROSE-NACL 5-0.45 % IV SOLN: INTRAVENOUS | Status: DC

## 2012-11-17 MED ORDER — ROCURONIUM BROMIDE 100 MG/10ML IV SOLN
INTRAVENOUS | Status: DC | PRN
  Administered 2012-11-17: 50 mg via INTRAVENOUS

## 2012-11-17 MED ORDER — POTASSIUM CHLORIDE 10 MEQ/50ML IV SOLN: 10.0000 meq | Freq: Every day | INTRAVENOUS | Status: DC | PRN

## 2012-11-17 MED ORDER — DEXTROSE-NACL 5-0.9 % IV SOLN
INTRAVENOUS | Status: DC
  Administered 2012-11-17 – 2012-11-18 (×2): via INTRAVENOUS

## 2012-11-17 MED ORDER — URSODIOL 300 MG PO CAPS
600.0000 mg | ORAL_CAPSULE | Freq: Two times a day (BID) | ORAL | Status: DC
  Administered 2012-11-18 – 2012-11-22 (×9): 600 mg via ORAL
  Filled 2012-11-17 (×10): qty 2

## 2012-11-17 MED ORDER — FENTANYL CITRATE 0.05 MG/ML IJ SOLN
INTRAMUSCULAR | Status: AC
  Filled 2012-11-17: qty 2

## 2012-11-17 MED ORDER — OMEGA-3 FATTY ACIDS 1000 MG PO CAPS: 1.0000 g | ORAL_CAPSULE | Freq: Three times a day (TID) | ORAL | Status: DC

## 2012-11-17 MED ORDER — KETOROLAC TROMETHAMINE 30 MG/ML IJ SOLN
INTRAMUSCULAR | Status: AC
  Filled 2012-11-17: qty 1

## 2012-11-17 MED ORDER — MESALAMINE ER 250 MG PO CPCR
2000.0000 mg | ORAL_CAPSULE | Freq: Two times a day (BID) | ORAL | Status: DC
  Administered 2012-11-18 (×2): 2000 mg via ORAL
  Filled 2012-11-17 (×4): qty 8

## 2012-11-17 MED ORDER — BISACODYL 5 MG PO TBEC
10.0000 mg | DELAYED_RELEASE_TABLET | Freq: Every day | ORAL | Status: DC
  Administered 2012-11-18 – 2012-11-22 (×4): 10 mg via ORAL
  Filled 2012-11-17 (×4): qty 2

## 2012-11-17 MED ORDER — OXYCODONE HCL 5 MG PO TABS: 5.0000 mg | ORAL_TABLET | ORAL | Status: AC | PRN

## 2012-11-17 MED ORDER — SODIUM CHLORIDE 0.9 % IJ SOLN: 9.0000 mL | INTRAMUSCULAR | Status: DC | PRN

## 2012-11-17 MED ORDER — OMEGA-3-ACID ETHYL ESTERS 1 G PO CAPS
1.0000 g | ORAL_CAPSULE | Freq: Three times a day (TID) | ORAL | Status: DC
  Administered 2012-11-18 – 2012-11-22 (×13): 1 g via ORAL
  Filled 2012-11-17 (×15): qty 1

## 2012-11-17 MED ORDER — DEXAMETHASONE SODIUM PHOSPHATE 4 MG/ML IJ SOLN
INTRAMUSCULAR | Status: DC | PRN
  Administered 2012-11-17: 4 mg via INTRAVENOUS

## 2012-11-17 MED ORDER — PANTOPRAZOLE SODIUM 40 MG PO TBEC
40.0000 mg | DELAYED_RELEASE_TABLET | Freq: Every day | ORAL | Status: DC
  Administered 2012-11-17 – 2012-11-22 (×6): 40 mg via ORAL
  Filled 2012-11-17 (×6): qty 1

## 2012-11-17 MED ORDER — DIPHENHYDRAMINE HCL 12.5 MG/5ML PO ELIX
12.5000 mg | ORAL_SOLUTION | Freq: Four times a day (QID) | ORAL | Status: DC | PRN
  Filled 2012-11-17: qty 5

## 2012-11-17 MED ORDER — ARTIFICIAL TEARS OP OINT
TOPICAL_OINTMENT | OPHTHALMIC | Status: DC | PRN
  Administered 2012-11-17: 1 via OPHTHALMIC

## 2012-11-17 MED ORDER — ACETAMINOPHEN 10 MG/ML IV SOLN
1000.0000 mg | Freq: Four times a day (QID) | INTRAVENOUS | Status: AC
  Administered 2012-11-17 – 2012-11-18 (×4): 1000 mg via INTRAVENOUS
  Filled 2012-11-17 (×4): qty 100

## 2012-11-17 MED ORDER — FENTANYL 10 MCG/ML IV SOLN
INTRAVENOUS | Status: DC
  Administered 2012-11-17: 150 ug via INTRAVENOUS
  Administered 2012-11-17 (×2): via INTRAVENOUS
  Administered 2012-11-17: 240 ug via INTRAVENOUS
  Administered 2012-11-18: 05:00:00 via INTRAVENOUS
  Administered 2012-11-18: 315 ug via INTRAVENOUS
  Administered 2012-11-18: 249.5 ug via INTRAVENOUS
  Administered 2012-11-18: 195 ug via INTRAVENOUS
  Administered 2012-11-18: 330 ug via INTRAVENOUS
  Administered 2012-11-18: 10 ug via INTRAVENOUS
  Administered 2012-11-18: 60 ug via INTRAVENOUS
  Administered 2012-11-18: 225 ug via INTRAVENOUS
  Administered 2012-11-19: 150 ug via INTRAVENOUS
  Administered 2012-11-19: 120 ug via INTRAVENOUS
  Administered 2012-11-19: 90 ug via INTRAVENOUS
  Administered 2012-11-19: 01:00:00 via INTRAVENOUS
  Administered 2012-11-19: 120 ug via INTRAVENOUS
  Administered 2012-11-19: 210 ug via INTRAVENOUS
  Administered 2012-11-20: 195 ug via INTRAVENOUS
  Administered 2012-11-20: 174.2 ug via INTRAVENOUS
  Administered 2012-11-20: 225 ug via INTRAVENOUS
  Administered 2012-11-20: 13:00:00 via INTRAVENOUS
  Administered 2012-11-21: 180 ug via INTRAVENOUS
  Administered 2012-11-21: 150 ug via INTRAVENOUS
  Administered 2012-11-21: 180 ug via INTRAVENOUS
  Administered 2012-11-21: 133.7 ug via INTRAVENOUS
  Administered 2012-11-22: 160 ug via INTRAVENOUS
  Administered 2012-11-22: 01:00:00 via INTRAVENOUS
  Administered 2012-11-22: 210 ug via INTRAVENOUS
  Administered 2012-11-22: 120 ug via INTRAVENOUS
  Filled 2012-11-17 (×12): qty 50

## 2012-11-17 MED ORDER — LACTATED RINGERS IV SOLN
INTRAVENOUS | Status: DC | PRN
  Administered 2012-11-17 (×2): via INTRAVENOUS

## 2012-11-17 MED ORDER — 0.9 % SODIUM CHLORIDE (POUR BTL) OPTIME
TOPICAL | Status: DC | PRN
  Administered 2012-11-17: 1000 mL

## 2012-11-17 MED ORDER — MIDAZOLAM HCL 5 MG/5ML IJ SOLN
INTRAMUSCULAR | Status: DC | PRN
  Administered 2012-11-17: 2 mg via INTRAVENOUS

## 2012-11-17 MED ORDER — FENTANYL CITRATE 0.05 MG/ML IJ SOLN
25.0000 ug | INTRAMUSCULAR | Status: DC | PRN
  Administered 2012-11-17 (×3): 50 ug via INTRAVENOUS

## 2012-11-17 MED ORDER — GLYCOPYRROLATE 0.2 MG/ML IJ SOLN
INTRAMUSCULAR | Status: DC | PRN
  Administered 2012-11-17: 0.6 mg via INTRAVENOUS

## 2012-11-17 MED ORDER — FENTANYL CITRATE 0.05 MG/ML IJ SOLN
INTRAMUSCULAR | Status: DC | PRN
  Administered 2012-11-17: 50 ug via INTRAVENOUS
  Administered 2012-11-17: 100 ug via INTRAVENOUS
  Administered 2012-11-17 (×4): 50 ug via INTRAVENOUS

## 2012-11-17 MED ORDER — OXYCODONE HCL 5 MG PO TABS
ORAL_TABLET | ORAL | Status: AC
  Filled 2012-11-17: qty 2

## 2012-11-17 MED ORDER — COLCHICINE 0.6 MG PO TABS
0.6000 mg | ORAL_TABLET | Freq: Two times a day (BID) | ORAL | Status: DC
  Administered 2012-11-17 – 2012-11-22 (×10): 0.6 mg via ORAL
  Filled 2012-11-17 (×12): qty 1

## 2012-11-17 MED ORDER — ONDANSETRON HCL 4 MG/2ML IJ SOLN: 4.0000 mg | Freq: Four times a day (QID) | INTRAMUSCULAR | Status: DC | PRN

## 2012-11-17 MED ORDER — NEOSTIGMINE METHYLSULFATE 1 MG/ML IJ SOLN
INTRAMUSCULAR | Status: DC | PRN
  Administered 2012-11-17: 4 mg via INTRAVENOUS

## 2012-11-17 SURGICAL SUPPLY — 74 items
APPLIER CLIP ROT 10 11.4 M/L (STAPLE)
BENZOIN TINCTURE PRP APPL 2/3 (GAUZE/BANDAGES/DRESSINGS) ×3 IMPLANT
CANISTER SUCTION 2500CC (MISCELLANEOUS) ×6 IMPLANT
CATH KIT ON Q 5IN SLV (PAIN MANAGEMENT) IMPLANT
CATH THORACIC 28FR (CATHETERS) IMPLANT
CATH THORACIC 28FR RT ANG (CATHETERS) IMPLANT
CATH THORACIC 36FR (CATHETERS) IMPLANT
CATH THORACIC 36FR RT ANG (CATHETERS) IMPLANT
CLIP APPLIE ROT 10 11.4 M/L (STAPLE) IMPLANT
CLIP TI MEDIUM 6 (CLIP) IMPLANT
CLOTH BEACON ORANGE TIMEOUT ST (SAFETY) ×3 IMPLANT
CONN ST 1/4X3/8  BEN (MISCELLANEOUS) ×2
CONN ST 1/4X3/8 BEN (MISCELLANEOUS) ×4 IMPLANT
CONN Y 3/8X3/8X3/8  BEN (MISCELLANEOUS) ×1
CONN Y 3/8X3/8X3/8 BEN (MISCELLANEOUS) ×2 IMPLANT
CONT SPEC 4OZ CLIKSEAL STRL BL (MISCELLANEOUS) ×6 IMPLANT
COVER SURGICAL LIGHT HANDLE (MISCELLANEOUS) ×6 IMPLANT
DERMABOND ADVANCED (GAUZE/BANDAGES/DRESSINGS) ×1
DERMABOND ADVANCED .7 DNX12 (GAUZE/BANDAGES/DRESSINGS) ×2 IMPLANT
DRAIN CHANNEL 28F RND 3/8 FF (WOUND CARE) ×6 IMPLANT
DRAIN CHANNEL 32F RND 10.7 FF (WOUND CARE) IMPLANT
DRAPE LAPAROSCOPIC ABDOMINAL (DRAPES) ×3 IMPLANT
DRAPE WARM FLUID 44X44 (DRAPE) ×6 IMPLANT
ELECT REM PT RETURN 9FT ADLT (ELECTROSURGICAL) ×3
ELECTRODE REM PT RTRN 9FT ADLT (ELECTROSURGICAL) ×2 IMPLANT
GAUZE SPONGE 4X4 16PLY XRAY LF (GAUZE/BANDAGES/DRESSINGS) ×3 IMPLANT
GLOVE EUDERMIC 7 POWDERFREE (GLOVE) ×6 IMPLANT
GOWN PREVENTION PLUS XLARGE (GOWN DISPOSABLE) ×3 IMPLANT
GOWN STRL NON-REIN LRG LVL3 (GOWN DISPOSABLE) ×6 IMPLANT
HEMOSTAT SURGICEL 2X14 (HEMOSTASIS) IMPLANT
KIT BASIN OR (CUSTOM PROCEDURE TRAY) ×3 IMPLANT
KIT ROOM TURNOVER OR (KITS) ×3 IMPLANT
KIT SUCTION CATH 14FR (SUCTIONS) ×3 IMPLANT
NS IRRIG 1000ML POUR BTL (IV SOLUTION) ×6 IMPLANT
PACK CHEST (CUSTOM PROCEDURE TRAY) ×3 IMPLANT
PAD ARMBOARD 7.5X6 YLW CONV (MISCELLANEOUS) ×6 IMPLANT
PAD ELECT DEFIB RADIOL ZOLL (MISCELLANEOUS) IMPLANT
POUCH ENDO CATCH II 15MM (MISCELLANEOUS) IMPLANT
POUCH SPECIMEN RETRIEVAL 10MM (ENDOMECHANICALS) IMPLANT
SEALANT PROGEL (MISCELLANEOUS) IMPLANT
SEALANT SURG COSEAL 4ML (VASCULAR PRODUCTS) IMPLANT
SEALANT SURG COSEAL 8ML (VASCULAR PRODUCTS) IMPLANT
SOLUTION ANTI FOG 6CC (MISCELLANEOUS) ×3 IMPLANT
SPECIMEN JAR MEDIUM (MISCELLANEOUS) IMPLANT
SPONGE GAUZE 4X4 12PLY (GAUZE/BANDAGES/DRESSINGS) ×3 IMPLANT
SPONGE INTESTINAL PEANUT (DISPOSABLE) IMPLANT
STAPLER VISISTAT 35W (STAPLE) IMPLANT
STRIP CLOSURE SKIN 1/2X4 (GAUZE/BANDAGES/DRESSINGS) ×3 IMPLANT
SUT PROLENE 4 0 RB 1 (SUTURE)
SUT PROLENE 4-0 RB1 .5 CRCL 36 (SUTURE) IMPLANT
SUT SILK  1 MH (SUTURE) ×2
SUT SILK 1 MH (SUTURE) ×4 IMPLANT
SUT SILK 2 0SH CR/8 30 (SUTURE) ×3 IMPLANT
SUT SILK 3 0SH CR/8 30 (SUTURE) IMPLANT
SUT VIC AB 1 CTX 36 (SUTURE) ×2
SUT VIC AB 1 CTX36XBRD ANBCTR (SUTURE) ×4 IMPLANT
SUT VIC AB 2-0 CTX 36 (SUTURE) ×6 IMPLANT
SUT VIC AB 2-0 UR6 27 (SUTURE) IMPLANT
SUT VIC AB 3-0 MH 27 (SUTURE) IMPLANT
SUT VIC AB 3-0 X1 27 (SUTURE) ×6 IMPLANT
SUT VICRYL 2 TP 1 (SUTURE) IMPLANT
SWAB COLLECTION DEVICE MRSA (MISCELLANEOUS) IMPLANT
SYR 30ML SLIP (SYRINGE) IMPLANT
SYRINGE 10CC LL (SYRINGE) IMPLANT
SYSTEM SAHARA CHEST DRAIN ATS (WOUND CARE) ×3 IMPLANT
TAPE CLOTH 4X10 WHT NS (GAUZE/BANDAGES/DRESSINGS) ×3 IMPLANT
TAPE CLOTH SURG 4X10 WHT LF (GAUZE/BANDAGES/DRESSINGS) ×3 IMPLANT
TIP APPLICATOR SPRAY EXTEND 16 (VASCULAR PRODUCTS) IMPLANT
TOWEL OR 17X24 6PK STRL BLUE (TOWEL DISPOSABLE) ×3 IMPLANT
TOWEL OR 17X26 10 PK STRL BLUE (TOWEL DISPOSABLE) ×6 IMPLANT
TRAP SPECIMEN MUCOUS 40CC (MISCELLANEOUS) ×18 IMPLANT
TRAY FOLEY CATH 14FRSI W/METER (CATHETERS) ×3 IMPLANT
TUBE ANAEROBIC SPECIMEN COL (MISCELLANEOUS) IMPLANT
WATER STERILE IRR 1000ML POUR (IV SOLUTION) ×6 IMPLANT

## 2012-11-17 NOTE — Anesthesia Postprocedure Evaluation (Signed)
  Anesthesia Post-op Note  Patient: David [name]  Procedure(s) Performed: Procedure(s) (LRB) with comments: VIDEO ASSISTED THORACOSCOPY (Left) - drainage of left pleural effusion PERICARDIAL WINDOW (N/A)  Patient Location: PACU  Anesthesia Type:General  Level of Consciousness: awake  Airway and Oxygen Therapy: Patient Spontanous Breathing  Post-op Pain: mild  Post-op Assessment: Post-op Vital signs reviewed  Post-op Vital Signs: Reviewed  Complications: No apparent anesthesia complications

## 2012-11-17 NOTE — H&P (Signed)
Reason for Consult:Pericardial and pleural effusions Referring Physician: Dr. Wall  David [name] is an 36 y.o. male.  HPI: 36 yo WM with a history of Crohn's disease, who presents with a cc/o SOB, leg swelling and general malaise. He says he has had leg swelling dating back to August. In November he became ill with fevers and chills, chest pain and shortness of breath. He also noted progression of his peripheral edema. He was evaluated and found to have pericarditis. He was treated with NSAIDs which helped the pain and temporarily helped his SOB. Despite an increase in his Lasix dose he had progressive swelling and his SOB worsened. He also began having chest pain again, but not as severe as before. He was seen in the office yesterday and was found to have gained 25 pounds. He was found to have JVD and a friction rub. An echo in the office showed a large pericardial effusion but no evidence of tamponade. He had normal LV function. A CXR showed a large cardiac shadow and an increased left pleural effusion.  Past Medical History  Diagnosis Date  . 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 Hepatomegaly 05/04/2011  . Fatty liver 05/04/2011    "stage III fatty liver fibrosis" (11/15/2012)  . GERD (gastroesophageal reflux disease)   . Chronic liver disease     /notes 11/15/2012  . Pericarditis     /notes 11/15/2012  . Hypertension   . Pneumonia 1977  . Shortness of breath     "all the time right now" (11/15/2012)  . History of blood transfusion 2004  . History of stomach ulcers   . Duodenal ulcer   . Depression   . Kidney stones     bilaterally/notes 11/15/2012  . Hepatic fibrosis     /notes 11/15/2012  . Anemia   . Pericardial effusion 10/29/2012    moderate to large/notes 11/15/2012    Past Surgical History  Procedure Date  . Anal examination under anesthesia 02/11/2011    [name]  . Treatment fistula anal  07/03/11    This was a second surgery to repair Anal fistula  . Small intestine surgery 2004    "had hole cut in small intestines during endoscopy; had to have OR & leave me open 3 months" (11/15/2012)  . Cholecystectomy ~ 2003  . Appendectomy ~ 2003    Family History  Problem Relation Age of Onset  . Diabetes Mother   . Diabetes Brother   . Healthy Daughter   . Healthy Son     Social History:  reports that he has quit smoking. His smoking use included Cigarettes. He started smoking about 2 weeks ago. He has a 12 pack-year smoking history. His smokeless tobacco use includes Snuff. He reports that he does not drink alcohol or use illicit drugs.  Allergies:  Allergies  Allergen Reactions  . Morphine And Related Hives    Medications:  I have reviewed the patient's current medications. Prior to Admission:  Prescriptions prior to admission  Medication Sig Dispense Refill  . albuterol (PROVENTIL HFA;VENTOLIN HFA) 108 (90 BASE) MCG/ACT inhaler Inhale 2 puffs into the lungs every 6 (six) hours as needed. breathing      . dicyclomine (BENTYL) 20 MG tablet Take 20 mg by mouth 2 (two) times daily as needed. Stomach pains       . enalapril (VASOTEC) 10 MG tablet Take 10 mg by mouth daily.      .   ferrous sulfate 325 (65 FE) MG tablet Take 325 mg by mouth 2 (two) times daily.       . fish oil-omega-3 fatty acids 1000 MG capsule Take 1 g by mouth 3 (three) times daily.      . furosemide (LASIX) 80 MG tablet Take 80 mg by mouth daily.      . ibuprofen (ADVIL,MOTRIN) 200 MG tablet Take 400 mg by mouth every 6 (six) hours as needed. Fever      . mesalamine (PENTASA) 250 MG CR capsule Take 2,000 mg by mouth 2 (two) times daily.       . Multiple Vitamins-Minerals (MULTIVITAMINS THER. W/MINERALS) TABS Take 1 tablet by mouth daily.        . Oxycodone HCl 20 MG TABS Take 10 mg by mouth 3 (three) times daily. pain      . oxymorphone (OPANA ER, CRUSH RESISTANT,) 10 MG T12A 12 hr tablet Take 10 mg by  mouth every 12 (twelve) hours.      . pantoprazole (PROTONIX) 40 MG tablet Take 1 tablet (40 mg total) by mouth daily.  30 tablet  4  . potassium chloride (K-DUR) 10 MEQ tablet Take 20 mEq by mouth daily.      . Probiotic Product (ALIGN) 4 MG CAPS Take 4 mg by mouth daily.       . promethazine (PHENERGAN) 25 MG tablet Take 25 mg by mouth every 6 (six) hours as needed. Nausea/vomiting      . spironolactone (ALDACTONE) 50 MG tablet Take 50 mg by mouth daily.      . ursodiol (ACTIGALL) 500 MG tablet Take 500 mg by mouth 2 (two) times daily.        . [DISCONTINUED] potassium chloride (K-DUR) 10 MEQ tablet Take 2 tablets (20 mEq total) by mouth daily.  60 tablet  6  . [DISCONTINUED] spironolactone (ALDACTONE) 50 MG tablet Take 1 tablet (50 mg total) by mouth daily.  30 tablet  1    Results for orders placed during the hospital encounter of 11/15/12 (from the past 48 hour(s))  APTT     Status: Abnormal   Collection Time   11/15/12  6:44 PM      Component Value Range Comment   aPTT 44 (*) 24 - 37 seconds   CBC WITH DIFFERENTIAL     Status: Abnormal   Collection Time   11/15/12  6:44 PM      Component Value Range Comment   WBC 14.7 (*) 4.0 - 10.5 K/uL    RBC 3.29 (*) 4.22 - 5.81 MIL/uL    Hemoglobin 8.7 (*) 13.0 - 17.0 g/dL    HCT 26.1 (*) 39.0 - 52.0 %    MCV 79.3  78.0 - 100.0 fL    MCH 26.4  26.0 - 34.0 pg    MCHC 33.3  30.0 - 36.0 g/dL    RDW 19.8 (*) 11.5 - 15.5 %    Platelets 239  150 - 400 K/uL    Neutrophils Relative 79 (*) 43 - 77 %    Neutro Abs 11.6 (*) 1.7 - 7.7 K/uL    Lymphocytes Relative 8 (*) 12 - 46 %    Lymphs Abs 1.1  0.7 - 4.0 K/uL    Monocytes Relative 12  3 - 12 %    Monocytes Absolute 1.8 (*) 0.1 - 1.0 K/uL    Eosinophils Relative 1  0 - 5 %    Eosinophils Absolute 0.2  0.0 - 0.7 K/uL      Basophils Relative 0  0 - 1 %    Basophils Absolute 0.0  0.0 - 0.1 K/uL   COMPREHENSIVE METABOLIC PANEL     Status: Abnormal   Collection Time   11/15/12  6:44 PM      Component  Value Range Comment   Sodium 128 (*) 135 - 145 mEq/L    Potassium 4.6  3.5 - 5.1 mEq/L    Chloride 93 (*) 96 - 112 mEq/L    CO2 26  19 - 32 mEq/L    Glucose, Bld 99  70 - 99 mg/dL    BUN 28 (*) 6 - 23 mg/dL    Creatinine, Ser 1.20  0.50 - 1.35 mg/dL    Calcium 8.2 (*) 8.4 - 10.5 mg/dL    Total Protein 5.7 (*) 6.0 - 8.3 g/dL    Albumin 2.0 (*) 3.5 - 5.2 g/dL    AST 41 (*) 0 - 37 U/L    ALT 19  0 - 53 U/L    Alkaline Phosphatase 154 (*) 39 - 117 U/L    Total Bilirubin 0.5  0.3 - 1.2 mg/dL    GFR calc non Af Amer 76 (*) >90 mL/min    GFR calc Af Amer 89 (*) >90 mL/min   MAGNESIUM     Status: Normal   Collection Time   11/15/12  6:44 PM      Component Value Range Comment   Magnesium 1.9  1.5 - 2.5 mg/dL   PHOSPHORUS     Status: Normal   Collection Time   11/15/12  6:44 PM      Component Value Range Comment   Phosphorus 3.8  2.3 - 4.6 mg/dL   PROTIME-INR     Status: Abnormal   Collection Time   11/15/12  6:44 PM      Component Value Range Comment   Prothrombin Time 15.9 (*) 11.6 - 15.2 seconds    INR 1.30  0.00 - 1.49   TSH     Status: Normal   Collection Time   11/15/12  6:44 PM      Component Value Range Comment   TSH 2.969  0.350 - 4.500 uIU/mL   PRO B NATRIURETIC PEPTIDE     Status: Abnormal   Collection Time   11/15/12  6:45 PM      Component Value Range Comment   Pro B Natriuretic peptide (BNP) 1454.0 (*) 0 - 125 pg/mL   HIV ANTIBODY (ROUTINE TESTING)     Status: Normal   Collection Time   11/15/12 10:28 PM      Component Value Range Comment   HIV NON REACTIVE  NON REACTIVE   COMPREHENSIVE METABOLIC PANEL     Status: Abnormal   Collection Time   11/16/12  5:15 AM      Component Value Range Comment   Sodium 128 (*) 135 - 145 mEq/L    Potassium 4.3  3.5 - 5.1 mEq/L    Chloride 95 (*) 96 - 112 mEq/L    CO2 24  19 - 32 mEq/L    Glucose, Bld 103 (*) 70 - 99 mg/dL    BUN 27 (*) 6 - 23 mg/dL    Creatinine, Ser 0.92  0.50 - 1.35 mg/dL    Calcium 8.0 (*) 8.4 - 10.5 mg/dL     Total Protein 5.4 (*) 6.0 - 8.3 g/dL    Albumin 1.8 (*) 3.5 - 5.2 g/dL    AST 44 (*) 0 - 37 U/L      ALT 18  0 - 53 U/L    Alkaline Phosphatase 126 (*) 39 - 117 U/L    Total Bilirubin 0.6  0.3 - 1.2 mg/dL    GFR calc non Af Amer >90  >90 mL/min    GFR calc Af Amer >90  >90 mL/min   CBC     Status: Abnormal   Collection Time   11/16/12  9:20 AM      Component Value Range Comment   WBC 9.7  4.0 - 10.5 K/uL    RBC 3.28 (*) 4.22 - 5.81 MIL/uL    Hemoglobin 8.7 (*) 13.0 - 17.0 g/dL    HCT 25.9 (*) 39.0 - 52.0 %    MCV 79.0  78.0 - 100.0 fL    MCH 26.5  26.0 - 34.0 pg    MCHC 33.6  30.0 - 36.0 g/dL    RDW 19.9 (*) 11.5 - 15.5 %    Platelets 256  150 - 400 K/uL     X-ray Chest Pa And Lateral   11/15/2012  *RADIOLOGY REPORT*  Clinical Data: Fever.  Pericarditis.  Crohn's disease.  CHEST - 2 VIEW  Comparison: 10/29/2012  Findings: Increased moderate left pleural effusion is seen with left lower lung atelectasis.  Cardiac enlargement again demonstrated.  Increased atelectasis seen in the right perihilar region.   Small pleural based opacity in the right lower hemithorax is stable.  IMPRESSION:  1.  Increased moderate left pleural effusion and left lower lung atelectasis. 2. Persistent cardiac enlargement and right lateral pleural based opacity.   Original Report Authenticated By: John Stahl, M.D.    Us Abdomen Limited  11/16/2012  *RADIOLOGY REPORT*  Clinical Data: Ascites. Cirrhosis.  LIMITED ABDOMEN ULTRASOUND FOR ASCITES  Technique:  Limited ultrasound survey for ascites was performed in all four abdominal quadrants.  Comparison:  None.  Findings: Mild ascites is seen mainly in the left lower quadrant. In addition, a sizable left pleural effusion is demonstrated.  IMPRESSION: Mild ascites mainly in left lower quadrant.  Left pleural effusion also noted.   Original Report Authenticated By: John Stahl, M.D.     Review of Systems  Constitutional: Positive for fever (with original illness 6 weeks  ago), chills and malaise/fatigue.  Respiratory: Positive for cough and shortness of breath.   Cardiovascular: Positive for chest pain (mild at present), orthopnea and leg swelling.  Gastrointestinal: Positive for abdominal pain.  Neurological: Positive for weakness.  All other systems reviewed and are negative.   Blood pressure 110/49, pulse 107, temperature 98.6 F (37 C), temperature source Oral, resp. rate 20, height 5' 11" (1.803 m), weight 242 lb (109.77 kg), SpO2 94.00%. Physical Exam  Vitals reviewed. Constitutional: He is oriented to person, place, and time. He appears well-developed and well-nourished. No distress.  HENT:  Head: Normocephalic and atraumatic.  Eyes: EOM are normal. Pupils are equal, round, and reactive to light.  Neck: Neck supple. JVD present. No thyromegaly present.  Cardiovascular: Exam reveals friction rub.        Diminished heart sounds  Respiratory: He has no wheezes. He has no rales.       Diminished BS left base  GI: Soft. He exhibits distension. There is no tenderness.  Musculoskeletal: He exhibits edema (4+ bilateral LE).  Lymphadenopathy:    He has no cervical adenopathy.  Neurological: He is alert and oriented to person, place, and time. No cranial nerve deficit.  Skin: Skin is warm and dry. There is pallor.  Psychiatric: He has   a normal mood and affect.    Assessment/Plan: 36 yo WM with a persistent pericardial effusion and moderate left pleural effusion as well as generalized anasarca. He is not in frank tamponade, but is relatively hypotensive which has limited the ability to diurese him.  He may benefit from pericardial window both diagnostically and therapeutically. He does understand that there is not a guarantee that evacuating the pericardial effusion will decrease his peripheral edema. And there is a possibility of a recurrence. He also understands there is a possibility that we will not obtain a definitive diagnosis.  I recommended  that we proceed with a left VATS to drain the left pleural effusion and do a pericardial window to drain the pericardial effusion. I have discussed with him the general nature of the procedure, need for general anesthesia, and incisions to be used. We discussed the expected hospital stay, overall recovery and short and long term outcomes. He understands the risks include but are not limited to death, MI, DVT/PE, bleeding, possible need for transfusion, infections, cardiac or lung injury, and other organ system dysfunction including respiratory, renal, or GI complications. He accepts these risks and agrees to proceed.  For OR in AM  HENDRICKSON,STEVEN C 11/16/2012, 6:01 PM      

## 2012-11-17 NOTE — OR Nursing (Signed)
Briefs removed and sent with patient to PACU

## 2012-11-17 NOTE — Brief Op Note (Addendum)
11/15/2012 - 11/17/2012  10:15 AM  PATIENT:  David [name]  36 y.o. male  PRE-OPERATIVE DIAGNOSIS:  1.Pericardial effusion 2.Left pleural effusion 3.History of pericarditis  POST-OPERATIVE DIAGNOSIS:  1.Pericardial effusion 2.Left pleural effusion 3.History of pericarditis   PROCEDURE:  LEFT VIDEO ASSISTED THORACOSCOPY, LEFT MINI THORACOTOMY, DRAINAGE OF LEFT PLEURAL EFFUSION, and DRAINAGE OF PERICARDIAL EFFUSION   SURGEON:  Surgeon(s) and Role:    * Loreli Slot, MD - Primary  PHYSICIAN ASSISTANT: Doree Fudge PA-C   ANESTHESIA:   general  EBL:  Total I/O In: 700 [I.V.:700] Out: 200 [Urine:75; Blood:125]  BLOOD ADMINISTERED:none  DRAINS: 2 28 Blake Drains (Anterior is in pericardial space and posterior is in left pleural space)   SPECIMEN:  Source of Specimen:  Left pleural fluid, pericardial fluid, and pericardial biopsy  DISPOSITION OF SPECIMEN:  PATHOLOGY;cytology and cultures also sent COUNTS CORRECT:  YES  DICTATION: .Dragon Dictation  PLAN OF CARE: Admit to inpatient   PATIENT DISPOSITION:  PACU - hemodynamically stable.   Delay start of Pharmacological VTE agent (>24hrs) due to surgical blood loss or risk of bleeding: yes  2 liters of fluid in left pleura 300 ml of bloody fluid in pericardium Severely inflamed pericardial fat pad, pericardium and epicardium

## 2012-11-17 NOTE — Interval H&P Note (Signed)
History and Physical Interval Note:  11/17/2012 7:48 AM  David [name]  has presented today for surgery, with the diagnosis of pericardial effusion  The various methods of treatment have been discussed with the patient and family. After consideration of risks, benefits and other options for treatment, the patient has consented to  Procedure(s) (LRB) with comments: VIDEO ASSISTED THORACOSCOPY (Left) - drainage of left pleural effusion PERICARDIAL WINDOW (N/A) as a surgical intervention .  The patient's history has been reviewed, patient examined, no change in status, stable for surgery.  I have reviewed the patient's chart and labs.  Questions were answered to the patient's satisfaction.     Dahmir Epperly C

## 2012-11-17 NOTE — Care Management Note (Signed)
    Page 1 of 1   11/17/2012     3:19:14 PM   CARE MANAGEMENT NOTE 11/17/2012  Patient:  David [name], David [name]   Account Number:  [id number]  Date Initiated:  11/17/2012  Documentation initiated by:  [facility]  Subjective/Objective Assessment:   Post op drainage of pleural effusions and pericardial effusions on 11-17-12.  has wife.     Action/Plan:   Anticipated DC Date:  11/21/2012   Anticipated DC Plan:  HOME W HOME HEALTH SERVICES      DC Planning Services  CM consult      Choice offered to / List presented to:             Status of service:  In process, will continue to follow Medicare Important Message given?   (If response is "NO", the following Medicare IM given date fields will be blank) Date Medicare IM given:   Date Additional Medicare IM given:    Discharge Disposition:    Per UR Regulation:  Reviewed for med. necessity/level of care/duration of stay  If discussed at Long Length of Stay Meetings, dates discussed:    Comments:  ContactGianno, Volner   [phone]                 [name] Mother [phone]

## 2012-11-17 NOTE — Addendum Note (Signed)
Addended by: Derry Lory A on: 11/17/2012 10:12 AM   Modules accepted: Orders

## 2012-11-17 NOTE — Preoperative (Signed)
Beta Blockers   Reason not to administer Beta Blockers:Not Applicable 

## 2012-11-17 NOTE — Plan of Care (Signed)
Problem: Phase I Progression Outcomes Goal: Activity tolerated as ordered Outcome: Progressing Dangle x 1 completed

## 2012-11-17 NOTE — Transfer of Care (Signed)
Immediate Anesthesia Transfer of Care Note  Patient: David [name]  Procedure(s) Performed: Procedure(s) (LRB) with comments: VIDEO ASSISTED THORACOSCOPY (Left) - drainage of left pleural effusion PERICARDIAL WINDOW (N/A)  Patient Location: PACU  Anesthesia Type:General  Level of Consciousness: awake, alert  and oriented  Airway & Oxygen Therapy: Patient Spontanous Breathing and Patient connected to nasal cannula oxygen  Post-op Assessment: Report given to PACU RN, Post -op Vital signs reviewed and stable and Patient moving all extremities X 4  Post vital signs: Reviewed and stable  Complications: No apparent anesthesia complications

## 2012-11-17 NOTE — Anesthesia Procedure Notes (Addendum)
Procedure Name: Intubation Date/Time: 11/17/2012 8:23 AM Performed by: Elon Alas Pre-anesthesia Checklist: Patient identified, Emergency Drugs available, Suction available, Timeout performed and Patient being monitored Patient Re-evaluated:Patient Re-evaluated prior to inductionOxygen Delivery Method: Circle system utilized Preoxygenation: Pre-oxygenation with 100% oxygen Intubation Type: IV induction Ventilation: Mask ventilation without difficulty Laryngoscope Size: Mac and 4 Grade View: Grade I Tube type: Oral Endobronchial tube: Double lumen EBT, Left, EBT position confirmed by auscultation and EBT position confirmed by fiberoptic bronchoscope and 39 Fr Number of attempts: 1 Airway Equipment and Method: Stylet Placement Confirmation: positive ETCO2,  breath sounds checked- equal and bilateral and ETT inserted through vocal cords under direct vision Tube secured with: Tape Dental Injury: Teeth and Oropharynx as per pre-operative assessment    RIJ CVP Dual Lumen: [id number]: The patient was identified and consent obtained.  TO was performed, and full barrier precautions were used.  The skin was anesthetized with lidocaine.  Once the vein was located with the 22 ga. needle using ultrasound guidance , the wire was inserted into the vein.  The wire location was confirmed with ultrasound.  The tissue was dilated and the catheter was carefully inserted, then sutured in place. A dressing was applied. The patient tolerated the procedure well.   CE

## 2012-11-17 NOTE — Progress Notes (Signed)
Patient ID: THARON BOMAR, male   DOB: December 25, 1975, 36 y.o.   MRN: [id number]   Patient Name: David [name] Date of Encounter: 11/17/2012    SUBJECTIVE Patient is in the postoperative unit after having a pericardial window and drainage of his pericardial fluid and pleural effusion. Still mildly sedated. Appropriate when aroused by RN staff.   CURRENT MEDS    . [facility] HOLD] albuterol  2.5 mg Nebulization Q6H  . [facility] HOLD] aspirin EC  81 mg Oral Daily  . [COMPLETED] cefUROXime (ZINACEF)  IV  1.5 g Intravenous 60 min Pre-Op  . colchicine  0.6 mg Oral BID  . [facility] HOLD] docusate sodium  100 mg Oral BID  . fentaNYL      . fentaNYL   Intravenous Q4H  . [facility] HOLD] ferrous sulfate  325 mg Oral TID WC  . ketorolac  30 mg Intravenous Q8H  . ketorolac      . oxyCODONE      . [facility] HOLD] oxyCODONE  10 mg Oral Q4H  . [MAR HOLD] sodium chloride  3 mL Intravenous Q12H  . [facility] HOLD] spironolactone  50 mg Oral Daily  . [DISCONTINUED] oxyCODONE  10 mg Oral TID    OBJECTIVE  Filed Vitals:   11/17/12 0711 11/17/12 1045 11/17/12 1130 11/17/12 1156  BP: 119/65   109/64  Pulse: 108   110  Temp: 98.9 F (37.2 C) 98 F (36.7 C)  97 F (36.1 C)  TempSrc: Oral     Resp: 22  24 21   Height:      Weight:      SpO2: 93%       Intake/Output Summary (Last 24 hours) at 11/17/12 1214 Last data filed at 11/17/12 1008  Gross per 24 hour  Intake   1040 ml  Output   1075 ml  Net    -35 ml   Filed Weights   11/15/12 1742 11/16/12 0644 11/17/12 0619  Weight: 248 lb 1.6 oz (112.537 kg) 242 lb (109.77 kg) 233 lb 6.4 oz (105.87 kg)    PHYSICAL EXAM  General: Pleasant, NAD. Neuro: Marland Kitchen Moves all extremities spontaneously. Psych: Normal affect. HEENT:  Normal  Neck: Supple without bruits or JVD. Lungs:  Resp regular and unlabored, CTA. Heart: RRR no s3, s4, or murmurs. Abdomen: Soft, non-tender, non-distended, BS + x 4.  Extremities: No clubbing, cyanosis, 1-2+ pitting edema DP/PT/Radials 2+ and  equal bilaterally.  Accessory Clinical Findings  CBC  Basename 11/16/12 2016 11/16/12 0920 11/15/12 1844  WBC 12.2* 9.7 --  NEUTROABS -- -- 11.6*  HGB 8.9* 8.7* --  HCT 27.0* 25.9* --  MCV 78.3 79.0 --  PLT 274 256 --   Basic Metabolic Panel  Basename 11/17/12 0640 11/16/12 2016 11/15/12 1844  NA 132* 130* --  K 4.6 4.8 --  CL 96 96 --  CO2 25 24 --  GLUCOSE 106* 134* --  BUN 21 23 --  CREATININE 0.79 0.76 --  CALCIUM 8.5 8.2* --  MG -- -- 1.9  PHOS -- -- 3.8   Liver Function Tests  [facility] 11/16/12 2016 11/16/12 0515  AST 36 44*  ALT 17 18  ALKPHOS 135* 126*  BILITOT 0.6 0.6  PROT 5.6* 5.4*  ALBUMIN 1.9* 1.8*   No results found for this basename: LIPASE:2,AMYLASE:2 in the last 72 hours Cardiac Enzymes No results found for this basename: CKTOTAL:3,CKMB:3,CKMBINDEX:3,TROPONINI:3 in the last 72 hours BNP No components found with this basename: POCBNP:3 D-Dimer No results found for  this basename: DDIMER:2 in the last 72 hours Hemoglobin A1C No results found for this basename: HGBA1C in the last 72 hours Fasting Lipid Panel No results found for this basename: CHOL,HDL,LDLCALC,TRIG,CHOLHDL,LDLDIRECT in the last 72 hours Thyroid Function Tests  Basename 11/15/12 1844  TSH 2.969  T4TOTAL --  T3FREE --  THYROIDAB --    TELE  Normal sinus rhythm  ECG    Radiology/Studies  X-ray Chest Pa And Lateral   11/15/2012  *RADIOLOGY REPORT*  Clinical Data: Fever.  Pericarditis.  Crohn's disease.  CHEST - 2 VIEW  Comparison: 10/29/2012  Findings: Increased moderate left pleural effusion is seen with left lower lung atelectasis.  Cardiac enlargement again demonstrated.  Increased atelectasis seen in the right perihilar region.   Small pleural based opacity in the right lower hemithorax is stable.  IMPRESSION:  1.  Increased moderate left pleural effusion and left lower lung atelectasis. 2. Persistent cardiac enlargement and right lateral pleural based opacity.    Original Report Authenticated By: Myles Rosenthal, M.D.    Dg Chest 2 View  10/29/2012  *RADIOLOGY REPORT*  Clinical Data: Chest pain.  CHEST - 2 VIEW  Comparison: 01/24/2009.  Findings: The cardiopericardial silhouette is enlarged.  The configuration suggests pericardial effusion.  Retrocardiac atelectasis is present.  Small left pleural effusion.  No airspace disease is present.  Scattered areas of atelectasis. Monitoring leads are projected over the chest.  On the frontal view, there is increased density along the right chest Jullianna Gabor with smooth margins.  This may represent pleural-based mass or loculated pleural fluid.  This pleural-based mass measures 75 mm x 26 mm.  Prior comparison exam was normal.  IMPRESSION: 1.  Enlargement of the cardiopericardial silhouette with configuration that suggests pericardial effusion.  Consider echocardiography. 2. Small left pleural effusion.  Pleural based opacity right lower chest may represent loculated pleural fluid or pleural mass. Consider follow-up chest CT with infusion for further assessment.   Original Report Authenticated By: Andreas Newport, M.D.    Ct Angio Chest W/cm &/or Wo Cm  10/29/2012  *RADIOLOGY REPORT*  Clinical Data: Chest pain, abnormal chest radiograph  CT ANGIOGRAPHY CHEST  Technique:  Multidetector CT imaging of the chest using the standard protocol during bolus administration of intravenous contrast. Multiplanar reconstructed images including MIPs were obtained and reviewed to evaluate the vascular anatomy.  Contrast:  100 ml Omnipaque-300 IV  Comparison: Chest radiographs dated 10/29/2012  Findings: No evidence of pulmonary embolism.  Small left pleural effusion.  Associated left lower lobe opacity, likely atelectasis.  No pneumothorax.  Visualized thyroid is unremarkable.  The heart is normal in size.  Moderate to large pericardial effusion.  Suspected faint enhancement of the parietal pericardium (series 4/image 47).  These findings are worrisome  for pericarditis.  Small mediastinal lymph nodes measuring up to 10 mm short axis (series 4/image 24).  No suspicious hilar or axillary lymphadenopathy.  Visualized upper abdomen is grossly unremarkable.  Very mild degenerative changes of the lower thoracic spine.  IMPRESSION: No evidence of pulmonary embolism.  Moderate to large pericardial effusion with suspected pericardial enhancement, worrisome for pericarditis.  Correlate for tamponade physiology.  Small left pleural effusion.  Associated left lower lobe atelectasis.   Original Report Authenticated By: Charline Bills, M.D.    US Abdomen Limited  11/16/2012  *RADIOLOGY REPORT*  Clinical Data: Ascites. Cirrhosis.  LIMITED ABDOMEN ULTRASOUND FOR ASCITES  Technique:  Limited ultrasound survey for ascites was performed in all four abdominal quadrants.  Comparison:  None.  Findings:  Mild ascites is seen mainly in the left lower quadrant. In addition, a sizable left pleural effusion is demonstrated.  IMPRESSION: Mild ascites mainly in left lower quadrant.  Left pleural effusion also noted.   Original Report Authenticated By: Myles Rosenthal, M.D.    Dg Chest Portable 1 View  11/17/2012  *RADIOLOGY REPORT*  Clinical Data: Postop.  Evaluate chest tube and line placement.  PORTABLE CHEST - 1 VIEW  Comparison: 11/15/2012  Findings:  There are to new left inferior hemithorax chest tubes. Most of the left pleural effusion has been evacuated since the prior study.  There is a new right internal jugular central venous line with its tip in the lower superior vena cava.  There is no pneumothorax.  There are coarse reticular opacities in the right mid lung and both lung bases, likely atelectasis.  There is some hazy opacity laterally along the right lower hemithorax which may reflect some loculated pleural fluid.  No overt pulmonary edema.  The cardiac silhouette is mildly enlarged.  IMPRESSION: To inferior left hemithorax chest tubes have mostly evacuated the previously  seen left pleural effusion.  There is no pneumothorax. The right internal jugular central venous line is well positioned.   Original Report Authenticated By: Amie Portland, M.D.     ASSESSMENT AND PLAN   I've spoken to Dr. Dorris Fetch of surgery. Operative findings demonstrated a very severely inflamed and stranded pericardial space and epicardium. There was some loculation but most of fluid was drained. Tissue and fluid sent for atypical infections as well as pathology. With his immunosuppressive therapy, we are concerned about the possibility of tuberculosis, atypical TB, CMV or other unusual infections. However, it may all be autoimmune related. I have spoken to Dr. Leone Payor of GI and Dr. Karilyn Cota, his primary gastroenterologist. Maryclare Labrador continue to hold Lasix for now. Will await pathology and cultures. For anti-inflammatory, we all agreed to colcichine is the best choice for now. Hopefully, this will be adequate. Steroids could be considered once infection is ruled out. Per gastroenterology, he was done with biological.  He has a future appointment at [facility] for second opinion for Dr.Rehman. He should keep that.  Signed, Valera Castle MD

## 2012-11-17 NOTE — Progress Notes (Signed)
Patient ID: David [name], male   DOB: [birth year], 36 y.o.   MRN: [id number]   SICU Evening Rounds:  Hemodynamically stable  CT output low.  Urine output  ok

## 2012-11-17 NOTE — Anesthesia Preprocedure Evaluation (Addendum)
Anesthesia Evaluation  Patient identified by MRN, date of birth, ID band Patient awake    Reviewed: Allergy & Precautions, H&P , NPO status , Patient's Chart, lab work & pertinent test results  Airway       Dental  (+) Dental Advisory Given   Pulmonary shortness of breath, pneumonia -,   pleural effusion         Cardiovascular hypertension,   Echo 12/4 EF 65-70%, mild LVH, pericardial effusion   Neuro/Psych PSYCHIATRIC DISORDERS Depression negative neurological ROS     GI/Hepatic PUD, GERD-  ,Hepatic fibrosis   Endo/Other  negative endocrine ROS  Renal/GU      Musculoskeletal   Abdominal   Peds  Hematology   Anesthesia Other Findings   Reproductive/Obstetrics                         Anesthesia Physical Anesthesia Plan  ASA: III  Anesthesia Plan: General   Post-op Pain Management:    Induction: Intravenous  Airway Management Planned: Oral ETT  Additional Equipment: Arterial line and CVP  Intra-op Plan:   Post-operative Plan: Extubation in OR  Informed Consent: I have reviewed the patients History and Physical, chart, labs and discussed the procedure including the risks, benefits and alternatives for the proposed anesthesia with the patient or authorized representative who has indicated his/her understanding and acceptance.   Dental advisory given  Plan Discussed with: Anesthesiologist and Surgeon  Anesthesia Plan Comments:         Anesthesia Quick Evaluation

## 2012-11-18 ENCOUNTER — Inpatient Hospital Stay (HOSPITAL_COMMUNITY): Payer: Medicare Other

## 2012-11-18 ENCOUNTER — Encounter (HOSPITAL_COMMUNITY): Payer: Self-pay | Admitting: Thoracic Surgery (Cardiothoracic Vascular Surgery)

## 2012-11-18 LAB — BASIC METABOLIC PANEL
BUN: 31 mg/dL — ABNORMAL HIGH (ref 6–23)
Calcium: 7.8 mg/dL — ABNORMAL LOW (ref 8.4–10.5)
Creatinine, Ser: 1.01 mg/dL (ref 0.50–1.35)
GFR calc Af Amer: >90 mL/min (ref >90)
GFR calc non Af Amer: >90 mL/min (ref >90)

## 2012-11-18 LAB — POCT I-STAT 3, ART BLOOD GAS (G3+)
Patient temperature: 98.4
pH, Arterial: 7.43 (ref 7.350–7.450)

## 2012-11-18 LAB — CBC
HCT: 23.4 % — ABNORMAL LOW (ref 39.0–52.0)
MCHC: 32.5 g/dL (ref 30.0–36.0)
MCV: 78.5 fL (ref 78.0–100.0)
Platelets: 262 10*3/uL (ref 150–400)
RDW: 19.5 % — ABNORMAL HIGH (ref 11.5–15.5)

## 2012-11-18 MED ORDER — ALBUMIN HUMAN 5 % IV SOLN
12.5000 g | Freq: Once | INTRAVENOUS | Status: AC
  Administered 2012-11-18: 12.5 g via INTRAVENOUS
  Filled 2012-11-18: qty 250

## 2012-11-18 MED ORDER — ENOXAPARIN SODIUM 40 MG/0.4ML ~~LOC~~ SOLN
40.0000 mg | SUBCUTANEOUS | Status: DC
  Administered 2012-11-18 – 2012-11-21 (×4): 40 mg via SUBCUTANEOUS
  Filled 2012-11-18 (×5): qty 0.4

## 2012-11-18 MED ORDER — FUROSEMIDE 10 MG/ML IJ SOLN
40.0000 mg | Freq: Once | INTRAMUSCULAR | Status: AC
  Administered 2012-11-18: 40 mg via INTRAVENOUS
  Filled 2012-11-18: qty 4

## 2012-11-18 MED FILL — Fentanyl Citrate Inj 0.05 MG/ML: INTRAMUSCULAR | Qty: 2 | Status: AC

## 2012-11-18 NOTE — Progress Notes (Signed)
David [name] Daily Rounding Note 11/18/2012, 8:25 AM  SUBJECTIVE:       Stable, chronic abdominal pain.  Pain  From chest tube.  Less SOB.  Last BM 2 days ago.   OBJECTIVE:         Vital signs in last 24 hours:    Temp:  [97 F (36.1 C)-99 F (37.2 C)] 98.4 F (36.9 C) (12/06 0752) Pulse Rate:  [82-110] 83  (12/06 0700) Resp:  [16-30] 19  (12/06 0700) BP: (83-116)/(42-72) 97/49 mmHg (12/06 0700) SpO2:  [95 %-100 %] 100 % (12/06 0819) Arterial Line BP: (92-145)/(38-70) 96/45 mmHg (12/06 0700) Weight:  [105.87 kg (233 lb 6.4 oz)-106.9 kg (235 lb 10.8 oz)] 106.9 kg (235 lb 10.8 oz) (12/06 0500) Last BM Date: 11/16/12 General: pale, looks ill.  Somewhat uncomfortable   Heart: RRR. Chest: clear in front.  Serosanguinous fluid in chest tubing.  Abdomen: soft, NT, ND.  BS quiet.   Extremities: nearly resolved pedal/LE edema. Neuro/Psych:  Pleasant, oriented x 3.   Relaxed.   Intake/Output from previous day: 12/05 0701 - 12/06 0700 In: 3420.2 [P.O.:1120; I.V.:1850.2; IV Piggyback:450] Out: 1760 [Urine:1025; Blood:125; Chest Tube:610]  Intake/Output this shift:    Lab Results:  Basename 11/18/12 0410 11/16/12 2016 11/16/12 0920  WBC 11.0* 12.2* 9.7  HGB 7.6* 8.9* 8.7*  HCT 23.4* 27.0* 25.9*  PLT 262 274 256  MCV           78  BMET  Basename 11/18/12 0410 11/17/12 0640 11/16/12 2016  NA 132* 132* 130*  K 5.4* 4.6 4.8  CL 99 96 96  CO2 23 25 24   GLUCOSE 139* 106* 134*  BUN 31* 21 23  CREATININE 1.01 0.79 0.76  CALCIUM 7.8* 8.5 8.2*   LFT  Basename 11/16/12 2016 11/16/12 0515 11/15/12 1844  PROT 5.6* 5.4* 5.7*  ALBUMIN 1.9* 1.8* 2.0*  AST 36 44* 41*  ALT 17 18 19   ALKPHOS 135* 126* 154*  BILITOT 0.6 0.6 0.5  BILIDIR -- -- --  IBILI -- -- --   ANA    Negative    11/16/12 HIV      Non reactive quantiferon gold  In process  PT/INR  Basename 11/16/12 2016 11/15/12 1844  LABPROT 16.0* 15.9*  INR 1.31 1.30    Studies/Results: Dg Chest Portable 1  View 11/17/2012  IMPRESSION: To inferior left hemithorax chest tubes have mostly evacuated the previously seen left pleural effusion.  There is no pneumothorax. The right internal jugular central venous line is well positioned.   Original Report Authenticated By: Amie Portland, M.D.     ASSESMENT: *  Pericarditis.  Pericardial window 12/5.  Studies pending to determine source of inflammation. With negative ANA, autoimmune process is less likely.  *  Pleural effusion, left chest tube in place post thoracentesis 12/5 *  Crohns dz.  Remicade on hold due to suspicion that it could have led to atypical infections, neoplastic process,  *  Normocytic anemia *  Hypoalbuminemia.  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  *  Renal insufficiency.   PLAN: *  Await various pending studies.  Supportive care.    LOS: 3 days   Jennye Moccasin  11/18/2012, 8:25 AM Pager: [phone]   [name] Attending  I have also seen and assessed the patient and agree with the above note. In holding pattern mostly awaiting study results So far they indicate infflammatory processes - await bxs and cx results  quantiferon gold TB test also pending ANA is negative  Iva Boop, MD, [facility] [phone] (pager) 11/18/2012 8:31 PM

## 2012-11-18 NOTE — Progress Notes (Signed)
1 Day Post-Op Procedure(s) (LRB): VIDEO ASSISTED THORACOSCOPY (Left) PERICARDIAL WINDOW (N/A) Subjective: C/o incisional pain Denies nausea, abdominal pain  Objective: Vital signs in last 24 hours: Temp:  [97 F (36.1 C)-99 F (37.2 C)] 98.4 F (36.9 C) (12/06 0752) Pulse Rate:  [82-110] 83  (12/06 0700) Cardiac Rhythm:  [-] Normal sinus rhythm (12/06 0400) Resp:  [16-30] 19  (12/06 0700) BP: (83-116)/(42-72) 97/49 mmHg (12/06 0700) SpO2:  [95 %-100 %] 100 % (12/06 0819) Arterial Line BP: (92-145)/(38-70) 96/45 mmHg (12/06 0700) Weight:  [233 lb 6.4 oz (105.87 kg)-235 lb 10.8 oz (106.9 kg)] 235 lb 10.8 oz (106.9 kg) (12/06 0500)  Hemodynamic parameters for last 24 hours:    Intake/Output from previous day: 12/05 0701 - 12/06 0700 In: 3420.2 [P.O.:1120; I.V.:1850.2; IV Piggyback:450] Out: 1760 [Urine:1025; Blood:125; Chest Tube:610] Intake/Output this shift:    General appearance: alert and no distress Neurologic: intact Heart: regular rate and rhythm Lungs: diminished breath sounds bibasilar Abdomen: normal findings: soft, non-tender Extremities: edema 1-2+ small air leak  Lab Results:  Basename 11/18/12 0410 11/16/12 2016  WBC 11.0* 12.2*  HGB 7.6* 8.9*  HCT 23.4* 27.0*  PLT 262 274   BMET:  Basename 11/18/12 0410 11/17/12 0640  NA 132* 132*  K 5.4* 4.6  CL 99 96  CO2 23 25  GLUCOSE 139* 106*  BUN 31* 21  CREATININE 1.01 0.79  CALCIUM 7.8* 8.5    PT/INR:  Basename 11/16/12 2016  LABPROT 16.0*  INR 1.31   ABG    Component Value Date/Time   PHART 7.430 11/18/2012 0408   HCO3 24.0 11/18/2012 0408   TCO2 25 11/18/2012 0408   O2SAT 96.0 11/18/2012 0408   CBG (last 3)  No results found for this basename: GLUCAP:3 in the last 72 hours  Assessment/Plan: S/P Procedure(s) (LRB): VIDEO ASSISTED THORACOSCOPY (Left) PERICARDIAL WINDOW (N/A) POD # 1 L VATS pericardial window, drainage of pleural effusion  CV- pericarditis- d/c toradol due to low UO,  started on colchicine  RESP - bibasilar atelectasis- IS/OOB, wheezing L > R- continue albuterol  RENAL- creatinine OK, but BUN up- will increase IVF, give albumin/ lasix this AM  Hyperkalemia- mild, follow  Keep foley today to monitor UO  Anemia- acute (secondary to ABL) on chronic  Transfer to 3300 when bed available     LOS: 3 days    Prescious Hurless C 11/18/2012

## 2012-11-18 NOTE — Op Note (Signed)
David [name], David [name]               ACCOUNT NO.:  [id number]  MEDICAL RECORD NO.:  [id number]  LOCATION:  2313                         FACILITY:  [facility]  PHYSICIAN:  Salvatore Decent. Dorris Fetch, M.D.DATE OF BIRTH:  02-09-1976  DATE OF PROCEDURE:  11/17/2012 DATE OF DISCHARGE:                              OPERATIVE REPORT   PREOPERATIVE DIAGNOSIS:  Pericardial and left pleural effusions.  POSTOPERATIVE DIAGNOSIS:  Pericardial and left pleural effusions.  PROCEDURE:  Left video-assisted thoracoscopy, drainage of left pleural effusion, pericardial window.  SURGEON:  Salvatore Decent. Dorris Fetch, M.D.  ASSISTANT:  Doree Fudge, PA.  ANESTHESIA:  General.  FINDINGS:  2 L of serosanguineous fluid within the left pleural space, markedly inflamed pericardial fat pad and pericardium, which was very thickened, also inflamed epicardial surface, bloody pericardial effusion.  David [name] is a 36 year old gentleman with a history of Crohn disease who has been treated with immunosuppressive medications.  He had an episode of pericarditis back in November, which was treated with nonsteroidal anti-inflammatories, however, he recently has noted increase in swelling, some chest discomfort, and shortness of breath.  He was seen in the office in [place] and an echocardiogram showed enlarged pericardial effusion without evidence of tamponade.  Chest x-ray showed a large left pleural effusion.  The patient was advised to undergo left VATS with drainage of the pericardial and pleural effusion and creation of pericardial window for both diagnostic and therapeutic purposes.  The indications, risks, benefits, and alternatives were discussed in detail with the patient.  He understood and accepted the risks and agreed to proceed.  OPERATIVE NOTE:  David [name] was brought to the operating room on November 17, 2012.  He was anesthetized and intubated with a double-lumen endotracheal tube.  A Foley catheter was  placed.  PAS hose were placed for DVT prophylaxis.  He was placed in the right lateral decubitus position and the left chest was prepped and draped in usual sterile fashion.  Single lung ventilation of the right lung was carried out and was tolerated well throughout the procedure.  An incision made in approximately the seventh intercostal space in the midaxillary line.  It was carried through the skin and subcutaneous tissue.  The chest was entered bluntly using a hemostat.  A sucker was placed into the chest and 2 L of fluid was evacuated.  The fluid was the amber in color.  Portion of the specimen was sent for chemistries and cell count.  AFB, fungal, viral, bacterial cultures, and cytology. After evacuating the fluid, thoracoscope was placed.  There was a generalized inflamed appearance to the pleura, but no masses or nodules were seen.  Next, a small utility incision was made anterolaterally, this was carried through the skin and subcutaneous tissue.  The psoas muscles were separated and the intercostal muscles were divided.  There was marked thickening of the pericardial fat pad, which was also densely adherent to the lingula and the pericardium careful dissection was carried out.  Finally the pericardium was identified.  It was incised and approximately 300 mL of fluid was evacuated.  This fluid was much bloodier than the pleural fluid.  This was sent for the same studies.  A 1  cm2 segment of the pericardium was resected and the tissue was sent for both Pathology as well as the aforementioned cultures.  Soccer then was placed in the pericardium in an attempt to ensure that all fluid was evacuated.  It should be noted that both pericardial and epicardial surface were inflamed with fibrinous exudate after evacuating as much of the pericardial fluid as possible, another incision was made.  A 28- [place] Blake drain was passed through this incision into the pericardial space along the  diaphragmatic surface.  The scope was removed from the original incision and a 2nd 28-French Blake drain was placed into the pleural space along the diaphragm posteriorly.  Both were secured with #1 silk sutures.  The scope was placed through the utility incision as the lung was reinflated.  There was good re-expansion of both lobes. The scope then was withdrawn and the incision was closed with #1 Vicryl fascial closure followed by 2-0 Vicryl subcutaneous closure and a 3-0 Vicryl subcuticular skin closure.  All sponge, needle, and instrument counts were correct at the end of the procedure.  The patient was extubated in the operating room and taken to the postanesthetic care unit in good condition.     Salvatore Decent Dorris Fetch, M.D.     SCH/MEDQ  D:  11/17/2012  T:  11/18/2012  Job:  [id number]

## 2012-11-18 NOTE — Progress Notes (Signed)
Pt admitted to 3300 from 2300. Oriented to unit and room. Safety discussed and call bell with in reach. VSS. 2 chest tubes in place to 20 of suction. Small air leak noted. Will continue to monitor.  Elijah Birk, RN

## 2012-11-18 NOTE — Plan of Care (Signed)
Problem: Phase I Progression Outcomes Goal: Activity tolerated as ordered Outcome: Progressing Up out of bed

## 2012-11-18 NOTE — Progress Notes (Signed)
Patient transferred to 2313 via wheelchair with propak and O2.  Placed on monitor upon arrival in 3300.  Vitals stable.

## 2012-11-19 ENCOUNTER — Inpatient Hospital Stay (HOSPITAL_COMMUNITY): Payer: Medicare Other

## 2012-11-19 DIAGNOSIS — J90 Pleural effusion, not elsewhere classified: Secondary | ICD-10-CM | POA: Diagnosis present

## 2012-11-19 LAB — CBC
MCH: 25.8 pg — ABNORMAL LOW (ref 26.0–34.0)
Platelets: 267 10*3/uL (ref 150–400)
RBC: 2.99 MIL/uL — ABNORMAL LOW (ref 4.22–5.81)
WBC: 8 10*3/uL (ref 4.0–10.5)

## 2012-11-19 LAB — COMPREHENSIVE METABOLIC PANEL
ALT: 17 U/L (ref 0–53)
AST: 44 U/L — ABNORMAL HIGH (ref 0–37)
Alkaline Phosphatase: 106 U/L (ref 39–117)
CO2: 26 mEq/L (ref 19–32)
Calcium: 7.9 mg/dL — ABNORMAL LOW (ref 8.4–10.5)
Chloride: 101 mEq/L (ref 96–112)
GFR calc non Af Amer: >90 mL/min (ref >90)
Potassium: 4.6 mEq/L (ref 3.5–5.1)
Sodium: 133 mEq/L — ABNORMAL LOW (ref 135–145)

## 2012-11-19 MED ORDER — SODIUM CHLORIDE 0.9 % IJ SOLN
10.0000 mL | Freq: Two times a day (BID) | INTRAMUSCULAR | Status: DC
  Administered 2012-11-19 (×2): 10 mL
  Administered 2012-11-19: 20 mL
  Administered 2012-11-20: 10 mL

## 2012-11-19 MED ORDER — SODIUM CHLORIDE 0.9 % IJ SOLN
10.0000 mL | INTRAMUSCULAR | Status: DC | PRN
  Filled 2012-11-19: qty 10
  Filled 2012-11-19 (×2): qty 20

## 2012-11-19 NOTE — Progress Notes (Signed)
Pt was wondering if central line could be left in due to being sticked for lab draws. Called gina collins PA, will leave central line in till after labs drawn then may d/c in am. David [name]

## 2012-11-19 NOTE — Progress Notes (Signed)
[facility]  Patient Name: David [name] Date of Encounter: 11/19/2012, 9:56 AM    Subjective  Feeling a bit better overall but frustrated with his situation. Gets tearful. "I am tired of taking all these big drugs". + passing flatus, no stools Tolerating diet   Objective    Physical Exam: Filed Vitals:   11/19/12 0816  BP: 128/75  Pulse: 85  Temp: 97.7 F (36.5 C)  Resp: 18   General: chronically ill Head: Normocephalic, atraumatic, sclera non-icteric, no xanthomas, nares are without discharge.  Neck: Negative for carotid bruits. JVD not elevated. Lungs: Clear anterior Heart: RRR S1 S2 no rub or gallop.  Abdomen: obese, probable ascites, non-tender Extremities:  Trace edema      Labs:  [facility] 11/19/12 0340 11/18/12 0410  NA 133* 132*  K 4.6 5.4*  CL 101 99  CO2 26 23  GLUCOSE 106* 139*  BUN 28* 31*  CREATININE 0.96 1.01  CALCIUM 7.9* 7.8*  MG -- --  PHOS -- --    Basename 11/19/12 0340 11/16/12 2016  AST 44* 36  ALT 17 17  ALKPHOS 106 135*  BILITOT 0.4 0.6  PROT 5.4* 5.6*  ALBUMIN 1.9* 1.9*     Basename 11/19/12 0340 11/18/12 0410  WBC 8.0 11.0*  NEUTROABS -- --  HGB 7.7* 7.6*  HCT 23.8* 23.4*  MCV 79.6 78.5  PLT 267 262   Surgical pathology is inflammatory on pericardium, cytology on pleural fluid and pericardial fluid is negative for malignancy Cultures/smears and quantiferon TB test still pending/in process   Assessment and Plan  1) Pericarditis/effusion s/p window 2) Pleural effusion - s/p chest tube 3) Crohn's disease recently on Remicade and on mesalamine 4) Immunosuppressed 5) 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

## 2012-11-19 NOTE — Progress Notes (Addendum)
[address]            [name] [address]          [phone]     2 Days Post-Op Procedure(s) (LRB): VIDEO ASSISTED THORACOSCOPY (Left) PERICARDIAL WINDOW (N/A)  Subjective: Feels "a whole lot better than when I came in."  Breathing stable, pain controlled.  Eating well, walked yesterday.   Objective: Vital signs in last 24 hours: Patient Vitals for the past 24 hrs:  BP Temp Temp src Pulse Resp SpO2 Weight  11/19/12 0403 - - - - - 98 % -  11/19/12 0338 - - - - - 99 % -  11/19/12 0300 108/68 mmHg 97.5 F (36.4 C) Oral 84  22  99 % 217 lb 6 oz (98.6 kg)  11/18/12 2300 119/63 mmHg 97.7 F (36.5 C) Oral 85  21  99 % -  11/18/12 2041 - - - - - 100 % -  11/18/12 2040 119/72 mmHg 98.2 F (36.8 C) Oral 88  24  100 % -  11/18/12 1607 106/62 mmHg 98.3 F (36.8 C) Oral 93  22  100 % -  11/18/12 1500 115/61 mmHg - - 95  20  99 % -  11/18/12 1439 - - - - - 100 % -  11/18/12 1400 100/58 mmHg - - 90  26  100 % -  11/18/12 1344 - - - - 20  100 % -  11/18/12 1300 100/63 mmHg - - 88  21  99 % -  11/18/12 1200 - - - 95  26  100 % -  11/18/12 1146 - 98.3 F (36.8 C) Oral - - - -  11/18/12 1100 110/65 mmHg - - 93  23  100 % -  11/18/12 1000 104/65 mmHg - - 93  22  100 % -  11/18/12 0900 103/53 mmHg - - 83  28  99 % -  11/18/12 0819 - - - - - 100 % -  11/18/12 0800 96/50 mmHg - - 82  22  98 % -  11/18/12 0752 - 98.4 F (36.9 C) Oral - - - -   Current Weight  11/19/12 217 lb 6 oz (98.6 kg)     Intake/Output from previous day: 12/06 0701 - 12/07 0700 In: 3045 [P.O.:820; I.V.:2175; IV Piggyback:50] Out: 2280 [Urine:1880; Chest Tube:400]    PHYSICAL EXAM:  Heart: RRR Lungs: Clear, no wheezes Wound: Clean and dry Chest tube: 1/7 intermittent air leak.  Small amount serosanguinous drainage around anterior CT    Lab Results: CBC: Basename 11/19/12 0340 11/18/12 0410  WBC 8.0 11.0*  HGB 7.7* 7.6*  HCT 23.8* 23.4*  PLT 267 262   BMET:   Basename 11/19/12 0340 11/18/12 0410  NA 133* 132*  K 4.6 5.4*  CL 101 99  CO2 26 23  GLUCOSE 106* 139*  BUN 28* 31*  CREATININE 0.96 1.01  CALCIUM 7.9* 7.8*    PT/INR:  Basename 11/16/12 2016  LABPROT 16.0*  INR 1.31   CXR: stable, no obvious ptx  Path: negative for malignancy Cx's negative so far  Assessment/Plan: S/P Procedure(s) (LRB): VIDEO ASSISTED THORACOSCOPY (Left) PERICARDIAL WINDOW (N/A) Doing well overall CT output around 400 ml/24 hrs.  Continue CTs to suction for now.  Hopefully can consider removing 1 CT soon.  Pulm- stable, no wheezing today. Continue pulm toilet. Will d/c Foley, decrease  IVF.  BUN trending down. Hyperkalemia- K+ down.  Follow. Expected postoperative blood loss anemia- stable, monitor. Ambulate in halls   LOS: 4 days    COLLINS,GINA H 11/19/2012   anterior tube has air leak will leave both tubes for nowI have seen and examined David [name] and agree with the above assessment  and plan.  Delight Ovens MD Beeper [phone] Office [phone] 11/19/2012 11:17 AM

## 2012-11-20 ENCOUNTER — Inpatient Hospital Stay (HOSPITAL_COMMUNITY): Payer: Medicare Other

## 2012-11-20 LAB — BASIC METABOLIC PANEL
BUN: 17 mg/dL (ref 6–23)
CO2: 25 mEq/L (ref 19–32)
Calcium: 8.1 mg/dL — ABNORMAL LOW (ref 8.4–10.5)
Chloride: 102 mEq/L (ref 96–112)
Creatinine, Ser: 0.6 mg/dL (ref 0.50–1.35)
GFR calc Af Amer: >90 mL/min (ref >90)
GFR calc non Af Amer: >90 mL/min (ref >90)
Glucose, Bld: 107 mg/dL — ABNORMAL HIGH (ref 70–99)
Potassium: 4.4 mEq/L (ref 3.5–5.1)
Sodium: 132 mEq/L — ABNORMAL LOW (ref 135–145)

## 2012-11-20 LAB — CBC
HCT: 26.2 % — ABNORMAL LOW (ref 39.0–52.0)
Hemoglobin: 8.5 g/dL — ABNORMAL LOW (ref 13.0–17.0)
MCH: 26.2 pg (ref 26.0–34.0)
MCHC: 32.4 g/dL (ref 30.0–36.0)
MCV: 80.9 fL (ref 78.0–100.0)
Platelets: 271 10*3/uL (ref 150–400)
RBC: 3.24 MIL/uL — ABNORMAL LOW (ref 4.22–5.81)
RDW: 19.4 % — ABNORMAL HIGH (ref 11.5–15.5)
WBC: 6.3 10*3/uL (ref 4.0–10.5)

## 2012-11-20 LAB — TISSUE CULTURE: Gram Stain: NONE SEEN

## 2012-11-20 LAB — BODY FLUID CULTURE: Culture: NO GROWTH

## 2012-11-20 LAB — CORTISOL-AM, BLOOD: Cortisol - AM: 2.6 ug/dL — ABNORMAL LOW (ref 4.3–22.4)

## 2012-11-20 MED ORDER — ALBUTEROL SULFATE (5 MG/ML) 0.5% IN NEBU
2.5000 mg | INHALATION_SOLUTION | Freq: Two times a day (BID) | RESPIRATORY_TRACT | Status: DC
  Administered 2012-11-20 – 2012-11-22 (×4): 2.5 mg via RESPIRATORY_TRACT
  Filled 2012-11-20 (×4): qty 0.5

## 2012-11-20 MED ORDER — ALBUTEROL SULFATE (5 MG/ML) 0.5% IN NEBU: 2.5000 mg | INHALATION_SOLUTION | Freq: Four times a day (QID) | RESPIRATORY_TRACT | Status: DC | PRN

## 2012-11-20 NOTE — Progress Notes (Addendum)
                    [address]            [place] [address]          [phone]     3 Days Post-Op Procedure(s) (LRB): VIDEO ASSISTED THORACOSCOPY (Left) PERICARDIAL WINDOW (N/A)  Subjective: Stable night, no complaints this am.   Objective: Vital signs in last 24 hours: Patient Vitals for the past 24 hrs:  BP Temp Temp src Pulse Resp SpO2 Weight  11/20/12 0444 - - - - - - 235 lb 14.3 oz (107 kg)  11/20/12 0353 - - - - 20  96 % -  11/20/12 0345 126/79 mmHg 98 F (36.7 C) Oral 94  20  96 % 235 lb 14.3 oz (107 kg)  11/20/12 0131 - - - 85  - - -  11/20/12 0004 117/71 mmHg 97.9 F (36.6 C) Oral 91  21  95 % -  11/20/12 0000 - - - - 21  95 % -  11/19/12 2018 126/87 mmHg - - 90  24  98 % -  11/19/12 2000 - - - - 18  98 % -  11/19/12 1947 126/87 mmHg 98 F (36.7 C) Oral 92  18  98 % -  11/19/12 1603 122/81 mmHg 98.2 F (36.8 C) Oral - 23  98 % -  11/19/12 1451 - - - - - 96 % -  11/19/12 1200 - 97.9 F (36.6 C) Oral - - - -  11/19/12 1158 157/90 mmHg - - 97  21  98 % -  11/19/12 0920 - - - - - 98 % -  11/19/12 0816 128/75 mmHg 97.7 F (36.5 C) Oral 85  18  96 % -   Current Weight  11/20/12 235 lb 14.3 oz (107 kg)     Intake/Output from previous day: 12/07 0701 - 12/08 0700 In: 1538.3 [P.O.:840; I.V.:698.3] Out: 2370 [Urine:1850; Chest Tube:520]    PHYSICAL EXAM:  Heart: RRR Lungs: Few scattered rhonchi bilaterally Wound: Clean and dry Chest tube: 1/7 air leak, continuous    Lab Results: CBC: Basename 11/20/12 0440 11/19/12 0340  WBC 6.3 8.0  HGB 8.5* 7.7*  HCT 26.2* 23.8*  PLT 271 267   BMET:  Basename 11/20/12 0440 11/19/12 0340  NA 132* 133*  K 4.4 4.6  CL 102 101  CO2 25 26  GLUCOSE 107* 106*  BUN 17 28*  CREATININE 0.60 0.96  CALCIUM 8.1* 7.9*    PT/INR: No results found for this basename: LABPROT,INR in the last 72 hours  CXR: stable, no obvious ptx  Assessment/Plan: S/P Procedure(s) (LRB): VIDEO ASSISTED  THORACOSCOPY (Left) PERICARDIAL WINDOW (N/A) CT had intermittent leak yesterday, but now has continuous 1/7 leak.  ?Need to decrease suction.  CXR stable.  CT output has been >500 ml/24 hours.  Will leave CTs for now until seen by MD. Expected postoperative blood loss anemia- stable, improving. Hyperkalemia- resolved. Continue pulm toilet, ambulation.     LOS: 5 days    COLLINS,GINA H 11/20/2012   still has air leak from anterior ct Will leave ct in place I have seen and examined Fulton Mole and agree with the above assessment  and plan.  Delight Ovens MD Beeper [phone] Office [phone] 11/20/2012 10:54 AM

## 2012-11-21 ENCOUNTER — Inpatient Hospital Stay (HOSPITAL_COMMUNITY): Payer: Medicare Other

## 2012-11-21 LAB — TYPE AND SCREEN: Unit division: 0

## 2012-11-21 MED ORDER — FERROUS SULFATE 325 (65 FE) MG PO TABS: 325.0000 mg | ORAL_TABLET | Freq: Three times a day (TID) | ORAL | Status: DC

## 2012-11-21 MED ORDER — FUROSEMIDE 40 MG PO TABS
40.0000 mg | ORAL_TABLET | Freq: Two times a day (BID) | ORAL | Status: DC
  Administered 2012-11-21 – 2012-11-22 (×3): 40 mg via ORAL
  Filled 2012-11-21 (×5): qty 1

## 2012-11-21 MED ORDER — BOOST PLUS PO LIQD
237.0000 mL | Freq: Three times a day (TID) | ORAL | Status: DC
  Administered 2012-11-21 – 2012-11-22 (×2): 237 mL via ORAL
  Filled 2012-11-21 (×7): qty 237

## 2012-11-21 MED ORDER — FERROUS SULFATE 325 (65 FE) MG PO TABS
325.0000 mg | ORAL_TABLET | Freq: Three times a day (TID) | ORAL | Status: DC
  Administered 2012-11-21 – 2012-11-22 (×4): 325 mg via ORAL
  Filled 2012-11-21 (×7): qty 1

## 2012-11-21 NOTE — Progress Notes (Signed)
4 Days Post-Op Procedure(s) (LRB): VIDEO ASSISTED THORACOSCOPY (Left) PERICARDIAL WINDOW (N/A) Subjective: Feels better today  Objective: Vital signs in last 24 hours: Temp:  [97.8 F (36.6 C)-98.6 F (37 C)] 98 F (36.7 C) (12/09 0329) Pulse Rate:  [87-96] 87  (12/09 0329) Cardiac Rhythm:  [-] Normal sinus rhythm (12/09 0329) Resp:  [17-22] 17  (12/09 0400) BP: (110-129)/(58-84) 110/60 mmHg (12/09 0329) SpO2:  [95 %-99 %] 99 % (12/09 0400) Weight:  [223 lb 15.8 oz (101.6 kg)] 223 lb 15.8 oz (101.6 kg) (12/09 0500)  Hemodynamic parameters for last 24 hours:    Intake/Output from previous day: 12/08 0701 - 12/09 0700 In: 2161.9 [P.O.:1200; I.V.:961.9] Out: 1711 [Urine:1500; Stool:1; Chest Tube:210] Intake/Output this shift:    General appearance: alert and no distress Neurologic: intact Heart: regular rate and rhythm Lungs: diminished breath sounds bibasilar Extremities: edema mild no air leak  Lab Results:  Basename 11/20/12 0440 11/19/12 0340  WBC 6.3 8.0  HGB 8.5* 7.7*  HCT 26.2* 23.8*  PLT 271 267   BMET:  Basename 11/20/12 0440 11/19/12 0340  NA 132* 133*  K 4.4 4.6  CL 102 101  CO2 25 26  GLUCOSE 107* 106*  BUN 17 28*  CREATININE 0.60 0.96  CALCIUM 8.1* 7.9*    PT/INR: No results found for this basename: LABPROT,INR in the last 72 hours ABG    Component Value Date/Time   PHART 7.430 11/18/2012 0408   HCO3 24.0 11/18/2012 0408   TCO2 25 11/18/2012 0408   O2SAT 96.0 11/18/2012 0408   CBG (last 3)  No results found for this basename: GLUCAP:3 in the last 72 hours  Assessment/Plan: S/P Procedure(s) (LRB): VIDEO ASSISTED THORACOSCOPY (Left) PERICARDIAL WINDOW (N/A) - S/p Pericardial window and drainage of pleural effusion Path- inflammation and abscess formation Bacterial cultures negative AFB, fungal, viral pending Looks better,  Edema improved but still present- will restart lasix now that BP is better No air leak this AM- d/c posterior  CT   LOS: 6 days    HENDRICKSON,STEVEN C 11/21/2012

## 2012-11-21 NOTE — Progress Notes (Signed)
Utilization review completed.  

## 2012-11-21 NOTE — Progress Notes (Signed)
Nutrition Follow-up  Intervention:    Boost Plus TID between meals to maximize oral intake of protein and calories.  Suggest changing diet to regular to help improve PO intake.  Assessment:   Patient reports that he does not use special gluten free foods at home; eats regular bread when he eats bread.  Patient reports no adverse GI symptoms when he eats gluten.  PO intake of meals has been fair.  Consumed ~50% of lunch tray at bedside.  Diet Order:  Gluten Free  Meds: Scheduled Meds:   . albuterol  2.5 mg Nebulization BID  . aspirin EC  81 mg Oral Daily  . bisacodyl  10 mg Oral Daily  . colchicine  0.6 mg Oral BID  . enoxaparin  40 mg Subcutaneous Q24H  . fentaNYL   Intravenous Q4H  . ferrous sulfate  325 mg Oral TID WC  . furosemide  40 mg Oral BID  . omega-3 acid ethyl esters  1 g Oral TID  . pantoprazole  40 mg Oral Daily  . spironolactone  50 mg Oral Daily  . ursodiol  600 mg Oral BID  . [DISCONTINUED] ferrous sulfate  325 mg Oral TID WC  . [DISCONTINUED] ferrous sulfate  325 mg Oral TID WC  . [DISCONTINUED] sodium chloride  10-40 mL Intracatheter Q12H   Continuous Infusions:   . dextrose 5 % and 0.9% NaCl 20 mL/hr at 11/21/12 1000   PRN Meds:.albuterol, diphenhydrAMINE, diphenhydrAMINE, naloxone, ondansetron (ZOFRAN) IV, ondansetron (ZOFRAN) IV, oxyCODONE-acetaminophen, potassium chloride, senna-docusate, sodium chloride, [DISCONTINUED] sodium chloride   CMP     Component Value Date/Time   NA 132* 11/20/2012 0440   K 4.4 11/20/2012 0440   CL 102 11/20/2012 0440   CO2 25 11/20/2012 0440   GLUCOSE 107* 11/20/2012 0440   BUN 17 11/20/2012 0440   CREATININE 0.60 11/20/2012 0440   CREATININE 0.68 10/04/2012 0130   CALCIUM 8.1* 11/20/2012 0440   PROT 5.4* 11/19/2012 0340   ALBUMIN 1.9* 11/19/2012 0340   AST 44* 11/19/2012 0340   ALT 17 11/19/2012 0340   ALKPHOS 106 11/19/2012 0340   BILITOT 0.4 11/19/2012 0340   GFRNONAA >90 11/20/2012 0440   GFRAA >90 11/20/2012 0440    CBG  (last 3)  No results found for this basename: GLUCAP:3 in the last 72 hours   Intake/Output Summary (Last 24 hours) at 11/21/12 1335 Last data filed at 11/21/12 1000  Gross per 24 hour  Intake 1495.75 ml  Output   1160 ml  Net 335.75 ml    Weight Status:  101.6 kg down from 109.77 kg on 12/4 with overall negative fluid status  Re-estimated needs:  2100-2300 kcals, 85-95 gm protein, 2.1-2.3 L fluid daily  Nutrition Dx:  Inadequate oral intake r/t poor appetite AEB consuming only liquids x 3-4 days PTA and poor intake of meals, ongoing.  Goal:  PO intake to meet >/=90% estimated nutrition needs, unmet.  Monitor:  PO intake, supplement tolerance, labs, weight trend.   Joaquin Courts, RD, LDN, CNSC Pager# [phone] After Hours Pager# [phone]

## 2012-11-21 NOTE — Progress Notes (Signed)
     [facility] Daily Rounding Note 11/21/2012, 8:21 AM  SUBJECTIVE:       Tolerating diet. Is on Gluten free diet, at suggestion of Dr Karilyn Cota, though pt does not have Celiac dz.  2 soft/loose, non-bloody stools yest.  Abdominal pain not worse than his baseline.  One of the two chest tubes removed over weekend.  Walking in halls.  OBJECTIVE:         Vital signs in last 24 hours:    Temp:  [97.8 F (36.6 C)-98.6 F (37 C)] 98 F (36.7 C) (12/09 0329) Pulse Rate:  [87-96] 87  (12/09 0329) Resp:  [17-22] 20  (12/09 0821) BP: (110-129)/(58-79) 110/60 mmHg (12/09 0329) SpO2:  [95 %-99 %] 96 % (12/09 0821) Weight:  [101.6 kg (223 lb 15.8 oz)] 101.6 kg (223 lb 15.8 oz) (12/09 0500) Last BM Date: 11/17/12 General: alert, comfortable   Heart: RRR.   Chest: clear B in fronmt Abdomen: soft, moderately tense, active BS, NT.  Extremities: mild pedal edema, overall improved from last week.  Neuro/Psych:  Pleasant, seems in good spirits, engaged.   Intake/Output from previous day: 12/08 0701 - 12/09 0700 In: 2161.9 [P.O.:1200; I.V.:961.9] Out: 1711 [Urine:1500; Stool:1; Chest Tube:210]  Intake/Output this shift:    Lab Results:  Basename 11/20/12 0440 11/19/12 0340  WBC 6.3 8.0  HGB 8.5* 7.7*  HCT 26.2* 23.8*  PLT 271 267   BMET  Basename 11/20/12 0440 11/19/12 0340  NA 132* 133*  K 4.4 4.6  CL 102 101  CO2 25 26  GLUCOSE 107* 106*  BUN 17 28*  CREATININE 0.60 0.96  CALCIUM 8.1* 7.9*   LFT  Basename 11/19/12 0340  PROT 5.4*  ALBUMIN 1.9*  AST 44*  ALT 17  ALKPHOS 106  BILITOT 0.4  BILIDIR --  IBILI --    Studies/Results: Dg Chest Port 1 View 11/21/2012    IMPRESSION:  1. 1.  Small left apical pneumothorax with chest tube in place.  This was not evident on comparison exam. 2.  Cardiomegaly and basilar atelectasis.  This was made a call report.   Original Report Authenticated By: Genevive Bi, M.D.      ASSESMENT: *   Pericarditis/effusion s/p window.   ANA,  cytology negative - work-up thus far sterile and inflammatory  *   Pleural effusion - s/p chest tube  *   Crohn's disease recently on Remicade and on mesalamine.  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  On Actigall (Dr Antoine Poche started on 11/18/12).  *   Hx depression.  *   Anemia.  On po Iron *   Mild hyponatremia.  *   Protein malnutrition.    PLAN: *  Supportive care.  Await multiple pericardial studies, quantiferon gold TB, studies.    LOS: 6 days   Jennye Moccasin  11/21/2012, 8:21 AM Pager: [phone]   I have personally taken an interval history, reviewed the chart, and examined the patient.  I agree with the extender's note, impression and recommendations.  Barbette Hair. Arlyce Dice, MD, [facility] [phone]

## 2012-11-22 ENCOUNTER — Inpatient Hospital Stay (HOSPITAL_COMMUNITY): Payer: Medicare Other

## 2012-11-22 LAB — BASIC METABOLIC PANEL
BUN: 11 mg/dL (ref 6–23)
CO2: 26 mEq/L (ref 19–32)
Chloride: 102 mEq/L (ref 96–112)
GFR calc Af Amer: >90 mL/min (ref >90)
Potassium: 4.8 mEq/L (ref 3.5–5.1)

## 2012-11-22 LAB — QUANTIFERON TB GOLD ASSAY (BLOOD)

## 2012-11-22 MED ORDER — COLCHICINE 0.6 MG PO TABS: 0.6000 mg | ORAL_TABLET | Freq: Two times a day (BID) | ORAL | Status: DC

## 2012-11-22 MED ORDER — FUROSEMIDE 40 MG PO TABS: 40.0000 mg | ORAL_TABLET | Freq: Every day | ORAL | Status: DC

## 2012-11-22 MED ORDER — URSODIOL 300 MG PO CAPS: 600.0000 mg | ORAL_CAPSULE | Freq: Two times a day (BID) | ORAL | Status: DC

## 2012-11-22 MED ORDER — FERROUS SULFATE 325 (65 FE) MG PO TABS: 325.0000 mg | ORAL_TABLET | Freq: Three times a day (TID) | ORAL | Status: DC

## 2012-11-22 MED ORDER — OXYCODONE HCL 20 MG PO TABS: 10.0000 mg | ORAL_TABLET | Freq: Three times a day (TID) | ORAL | Status: DC

## 2012-11-22 MED ORDER — OXYMORPHONE HCL ER 10 MG PO T12A
10.0000 mg | EXTENDED_RELEASE_TABLET | Freq: Two times a day (BID) | ORAL | Status: DC
  Administered 2012-11-22: 10 mg via ORAL
  Filled 2012-11-22: qty 1

## 2012-11-22 MED ORDER — ASPIRIN 81 MG PO TBEC: 81.0000 mg | DELAYED_RELEASE_TABLET | Freq: Every day | ORAL | Status: DC

## 2012-11-22 MED ORDER — OXYCODONE HCL 5 MG PO TABS
10.0000 mg | ORAL_TABLET | Freq: Three times a day (TID) | ORAL | Status: DC
  Administered 2012-11-22: 10 mg via ORAL
  Filled 2012-11-22: qty 2

## 2012-11-22 NOTE — Progress Notes (Signed)
     [facility] Daily Rounding Note 11/22/2012, 9:39 AM  SUBJECTIVE:       Going home today.  One stool yesterday, 2 the day before.  Abdominal discomfort is stable at baseline.  No nausea.   OBJECTIVE:         Vital signs in last 24 hours:    Temp:  [97.7 F (36.5 C)-98.5 F (36.9 C)] 98.1 F (36.7 C) (12/10 0829) Pulse Rate:  [89-93] 89  (12/10 0300) Resp:  [13-19] 15  (12/10 0300) BP: (105-120)/(58-73) 105/61 mmHg (12/10 0300) SpO2:  [92 %-98 %] 96 % (12/10 0748) Weight:  95 kg/209 lb 7 oz  Was 112.5 kg/248 # 11/15/2012  General: looks better, still a bit pale.  Heart: RRR Chest: clear, no dyspnea.  Minimal yellowish liquid in chest tube apparatus Abdomen: soft. NT, ND  Extremities: 1+ pedal/ankle edema but overall much improved anasarca. Neuro/Psych:  Pleasant, smiling, in good spirits  Intake/Output from previous day: 12/09 0701 - 12/10 0700 In: 1846.4 [P.O.:1320; I.V.:526.4] Out: 5810 [Urine:5700; Chest Tube:110]  Lab Results:  [facility] 11/20/12 0440  WBC 6.3  HGB 8.5*  HCT 26.2*  PLT 271   BMET  Basename 11/22/12 0606 11/20/12 0440  NA 136 132*  K 4.8 4.4  CL 102 102  CO2 26 25  GLUCOSE 91 107*  BUN 11 17  CREATININE 0.66 0.60  CALCIUM 8.3* 8.1*    Studies/Results: Dg Chest Port 1 View 11/22/2012  .  IMPRESSION: Two chest tubes were evident on the prior study.  On the current exam, only one is seen, presumably the other has been removed.  Small left apical pneumothorax seen on the previous day's study is no longer evident.  Persistent mild lung base atelectasis.   Original Report Authenticated By: Amie Portland, M.D.     ASSESMENT: * Pericarditis/effusion s/p window. ANA, cytology negative - work-up thus far sterile and inflammatory.  On colchicine.  * Pleural effusion - second of two chest tubes to be removed today.   *  Volume overload.  Significant 17.5 kg/39# weight loss in 7 days. Current diuretic is Spironolactone 50 mg/day, Furosemide 40 mg/day.  Kidney's tolerating diuretics.  * Crohn's disease recently on Remicade and on mesalamine. 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 Dr Karilyn Cota started Actigall over a year ago.  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  * Hx depression.  * Anemia. On po Iron TID. Hgb improved.  * Protein malnutrition. Receiving Boost plus TID   PLAN: *  Home today.  *  Pt has appt with GI specialist Noel Gerold at [facility] in Jan 2014 at 1 PM.  In meantime leave him off Mesalamine and Remicade. Continue Iron.  Please send cc of discharge to Dr Stevphen Rochester.   *  May not need Actigall but will leave in place.  Dr Stevphen Rochester can review meds and decide if this is necessary med.    LOS: 7 days   Jennye Moccasin  11/22/2012, 9:39 AM Pager: [phone]   I have personally taken an interval history, reviewed the chart, and examined the patient.  I agree with the extender's note, impression and recommendations.  Barbette Hair. Arlyce Dice, MD, [facility] [phone]

## 2012-11-22 NOTE — Progress Notes (Signed)
Quick Note:  indeterminant result AFB smears still pending - so would likely not do anything different at this time  Will forward to Dr. Karilyn Cota, his primary GI MD  ______

## 2012-11-22 NOTE — Discharge Instructions (Signed)
Thoracotomy Care After Refer to this sheet in the next few weeks. These instructions provide you with information on caring for yourself after your procedure. Your caregiver may also give you more specific instructions. Your treatment has been planned according to current medical practices, but problems sometimes occur. Call your caregiver if you have any problems or questions after your procedure. HOME CARE INSTRUCTIONS  Remove the bandage (dressing) over your chest tube site as directed by your caregiver.  It is normal to be sore for a couple weeks following surgery. See your caregiver if this seems to be getting worse rather than better.  Only take over-the-counter or prescription medicines for pain, discomfort, or fever as directed by your caregiver. It is very important to take pain medicine when you need it so that you will cough and breathe deeply enough to clear mucus (phlegm) and expand your lungs. This helps prevent a lung infection (pneumonia).  If it hurts to cough, hold a pillow against your chest when you cough. This may help with the discomfort. In spite of the discomfort, cough frequently.  Taking deep breaths keeps lungs inflated and protects against pneumonia. Most patients will go home with a device called an incentive spirometer that encourages deep breathing.  You may resume a normal diet and activities as directed.  Use showers for bathing until you see your caregiver, or as instructed.  Change dressings if necessary or as directed.  Avoid lifting or driving until you are instructed otherwise.  Make an appointment to see your caregiver for stitch (suture) or staple removal when instructed.  Do not travel by airplane for 2 weeks after the chest tube is removed. SEEK MEDICAL CARE IF:  You are bleeding from your wounds.  Your heartbeat seems irregular.  You have redness, swelling, or increasing pain in the wounds.  There is pus coming from your wounds.  There is  a bad smell coming from the wound or dressing. SEEK IMMEDIATE MEDICAL CARE IF:  You have a fever.  You develop a rash.  You have difficulty breathing.  You develop any reaction or side effects to medicines given.  You develop lightheadedness or feel faint.  You develop shortness of breath or chest pain. MAKE SURE YOU:  Understand these instructions.  Will watch your condition.  Will get help right away if you are not doing well or get worse. Document Released: 05/15/2011 Document Revised: 02/22/2012 Document Reviewed: 05/15/2011 Exit[employer] Patient Information 2013 [employer].  

## 2012-11-22 NOTE — Progress Notes (Signed)
Patient ID: David [name], male   DOB: June 04, 1976, 36 y.o.   MRN: [id number] David [name] is feeling much better today. He is actually smiling and very conversant. He really wants to get home for his son's birthday this Thursday. He continues to diurese extremely well and has no lower extremity edema. Chest x-ray ordered this morning and pending. Electrolytes are stable. All cultures are negative thus far.  I suspect this is viral or autoimmune related pericardial effusion and pericarditis. Will need to hold cultures to rule out any atypical infections.  I would continue Lasix twice a day today.  followup on x-ray.  I suspect he can go home tomorrow. I would go with daily Lasix every morning. He's been advised to weigh himself every morning. He'll need to remain on colchicine until I see him back in the office. I'll arrange followup within a week of discharge. Thanks to the surgical team and the GI team for their excellent care.

## 2012-11-22 NOTE — Progress Notes (Addendum)
[address]            [place] [address]          [phone]     5 Days Post-Op  Procedure(s) (LRB): VIDEO ASSISTED THORACOSCOPY (Left) PERICARDIAL WINDOW (N/A) Subjective: He feels ok, only mild discomfort  Objective  Telemetry sinus rhythm  Temp:  [97.7 F (36.5 C)-98.5 F (36.9 C)] 97.7 F (36.5 C) (12/10 0300) Pulse Rate:  [89-93] 89  (12/10 0300) Resp:  [13-20] 15  (12/10 0300) BP: (105-120)/(58-73) 105/61 mmHg (12/10 0300) SpO2:  [92 %-98 %] 96 % (12/10 0748) Weight:  [209 lb 7 oz (95 kg)] 209 lb 7 oz (95 kg) (12/10 0300)   Intake/Output Summary (Last 24 hours) at 11/22/12 0757 Last data filed at 11/22/12 0700  Gross per 24 hour  Intake 1846.37 ml  Output   5810 ml  Net -3963.63 ml       General appearance: alert, cooperative and no distress Heart: regular rate and rhythm Lungs: dim in L base Abdomen: soft, moderate distension, nontender Extremities: + mild LE edema Wound: mild serous drainage on dressings  Lab Results:  [facility] 11/22/12 0606 11/20/12 0440  NA 136 132*  K 4.8 4.4  CL 102 102  CO2 26 25  GLUCOSE 91 107*  BUN 11 17  CREATININE 0.66 0.60  CALCIUM 8.3* 8.1*  MG -- --  PHOS -- --   No results found for this basename: AST:2,ALT:2,ALKPHOS:2,BILITOT:2,PROT:2,ALBUMIN:2 in the last 72 hours No results found for this basename: LIPASE:2,AMYLASE:2 in the last 72 hours  Basename 11/20/12 0440  WBC 6.3  NEUTROABS --  HGB 8.5*  HCT 26.2*  MCV 80.9  PLT 271   No results found for this basename: CKTOTAL:4,CKMB:4,TROPONINI:4 in the last 72 hours No components found with this basename: POCBNP:3 No results found for this basename: DDIMER in the last 72 hours No results found for this basename: HGBA1C in the last 72 hours No results found for this basename: CHOL,HDL,LDLCALC,TRIG,CHOLHDL in the last 72 hours No results found for this basename: TSH,T4TOTAL,FREET3,T3FREE,THYROIDAB in the last 72 hours No  results found for this basename: VITAMINB12,FOLATE,FERRITIN,TIBC,IRON,RETICCTPCT in the last 72 hours  Medications: Scheduled    . albuterol  2.5 mg Nebulization BID  . aspirin EC  81 mg Oral Daily  . bisacodyl  10 mg Oral Daily  . colchicine  0.6 mg Oral BID  . enoxaparin  40 mg Subcutaneous Q24H  . fentaNYL   Intravenous Q4H  . ferrous sulfate  325 mg Oral TID WC  . furosemide  40 mg Oral BID  . lactose free nutrition  237 mL Oral TID BM  . omega-3 acid ethyl esters  1 g Oral TID  . pantoprazole  40 mg Oral Daily  . spironolactone  50 mg Oral Daily  . ursodiol  600 mg Oral BID  . [DISCONTINUED] sodium chloride  10-40 mL Intracatheter Q12H     Radiology/Studies:  Dg Chest Port 1 View  11/21/2012  *RADIOLOGY REPORT*  Clinical Data: Postop tube check  PORTABLE CHEST - 1 VIEW  Comparison: Chest radiograph 11/20/2012  Findings: Stable enlarged cardiac silhouette.  Chest tube at the left lung bases unchanged.  There is a left apical pneumothorax which is small in volume with the pleural edge measuring 7 mm from the apical chest wall.  Mild basilar atelectasis.  IMPRESSION:  1. 1.  Small  left apical pneumothorax with chest tube in place.  This was not evident on comparison exam. 2.  Cardiomegaly and basilar atelectasis.  This was made a call report.   Original Report Authenticated By: Genevive Bi, M.D.     INR: Will add last result for INR, ABG once components are confirmed Will add last 4 CBG results once components are confirmed   Chest tube-110 cc drainage, serous, no air leak   Active Problems:  Long-term use of immunosuppressant medication  Pleural effusion  Assessment/Plan: S/P Procedure(s) (LRB): VIDEO ASSISTED THORACOSCOPY (Left) PERICARDIAL WINDOW (N/A)  1. Stable - D/C chest tube, D/C pca, restart  chronic pain meds at home dose  GI following  3 cont diuretic 4 poss D/C 1-2 days if all agree       LOS: 7 days    GOLD,WAYNE E 12/10/20137:57 AM      Looks good CXR OK Will d/c ct this AM, then possibly home later today

## 2012-11-22 NOTE — Discharge Summary (Signed)
[address]            [name] [address]          [phone]      [name] 20-Sep-1976 36 y.o. [id number]  11/15/2012   David Slot, MD  CHF,anemia,pericarditis pericardial effusion  HPI: At time of admission  David [name] is a 36 year old Caucasian male patient of Dr. Daleen Squibb, Dr,[place]uk[place], and Dr. Karilyn Cota GI, coursing in the office today secondary to ongoing fluid retention. The patient was seen in the emergency room initially on 10/29/2012 with chest pain. His CT scan showed no evidence of pulmonary embolus but a moderate to large pericardial effusion with suspected, pericardial enhancement was worrisome for pericarditis. He was started on Naprosyn and scheduled to have follow up. The patient's Lasix was increased from 80 mg daily to 80 mg twice a day. The patient has subsequently gained up to 25 pounds since being seen last. The patient was continued on NSAIDs her notes from Dr. Daleen Squibb via telephone messaging to our [name] office. The patient continued to have chest discomfort, PND, edema up to his knees, dyspnea, and overall fatigue. He also had complaints of early thigh AV, has not been able to eat very much only drinking a little water and Gatorade. He comes today with substantial fluid overload, pale, with continued complaints of inspirational discomfort.  On assessment the patient was found to have a pericardial rub, 2+ edema to the knees, positive for JVD, and very hypotensive with a systolic blood pressure 80. Dr. Dietrich Pates was asked to see the patient in consultation with David [name], with whom he was originally scheduled. It was Ms. Lawrence [name] assessment the patient needed to be admitted for fluid overload, and ongoing assessment of liver and pericardial effusion. He was seen and evaluated more extensively by Dr. [place] Bing, with positive pulses are.cyst with a systolic of 70. Echocardiogram was completed in the clinic  room which did not reveal cardiac, with Normal RV Function. The patient was advised to be admitted to [facility], where he will be placed in Step down Unit. Consultation was obtained with Dr. Stan Head from gastroenterology as well as Charlett Lango M.D. from cardiothoracic surgery. It was recommended that he proceed with left VATS for both diagnostic as well as for relief of symptoms.   Past Medical History   Diagnosis  Date   .  Fistula, anal  05/04/2011   .  Crohn's disease with fistula  05/04/2011   .  Hepatomegaly  05/04/2011   .  Fatty liver  05/04/2011   .  GERD (gastroesophageal reflux disease)    .  Crohn's disease     Family History   Problem  Relation  Age of Onset   .  Diabetes  Mother    .  Diabetes  Brother    .  Healthy  Daughter    .  Healthy  Son     History    Social History   .  Marital Status:  Married     Spouse Name:  N/A     Number of Children:  N/A   .  Years of Education:  N/A    Occupational History   .  Not on file.    Social History Main Topics   .  Smoking status:  Former Smoker -- 0.5 packs/day  Types:  Cigarettes     Start date:  10/28/2012   .  Smokeless tobacco:  Current User     Types:  Snuff      Comment: Patient states that he stopped smoking this weekend   .  Alcohol Use:  No   .  Drug Use:  No   .  Sexually Active:  Not on file    Other Topics  Concern   .  Not on file    Social History Narrative   .  No narrative on file    Past Surgical History   Procedure  Date   .  Anal examination under anesthesia  02/11/2011     [name]   .  Treatment fistula anal  07/03/11     This was a second surgery to repair Anal fistula   .  Appendectomy    .  Cholecystectomy    .  Small intestine surgery    .  Stomach surgery         Hospital Course:  The patient was taken the operating room on 11/17/2012 and underwent the following procedure:  OPERATIVE REPORT  PREOPERATIVE DIAGNOSIS: Pericardial and left pleural  effusions.  POSTOPERATIVE DIAGNOSIS: Pericardial and left pleural effusions.  PROCEDURE: Left video-assisted thoracoscopy, drainage of left pleural  effusion, pericardial window.  SURGEON: Salvatore Decent. Dorris Fetch, M.D.  ASSISTANT: Doree Fudge, PA.  ANESTHESIA: General.  FINDINGS: 2 L of serosanguineous fluid within the left pleural space,  markedly inflamed pericardial fat pad and pericardium, which was very  thickened, also inflamed epicardial surface, bloody pericardial  effusion.   Postoperative hospital course:  The patient is clinically progressed nicely in regards to the recovery from the thoracic procedure. The tubes have been removed in a stepwise routine manner following usual protocols. Cultures thus far are negative. Atypical culture results are currently still pending. Pathology reveals this to be a inflammatory process. He has been followed postoperatively by the gastroenterology team who has been adjusting his medications. His incisions are healing well without evidence of infection. He is tolerating gradually increasing activities using standard protocols. His GI exam is felt to be stable. He has been started on increased diuretics which will continue at home. His last chest tube will be removed on today's date and no problems following that he will be discharged later this afternoon.     Basename 11/22/12 0606 11/20/12 0440  NA 136 132*  K 4.8 4.4  CL 102 102  CO2 26 25  GLUCOSE 91 107*  BUN 11 17  CALCIUM 8.3* 8.1*    Basename 11/20/12 0440  WBC 6.3  HGB 8.5*  HCT 26.2*  PLT 271   No results found for this basename: INR:2 in the last 72 hours   Discharge Instructions:  The patient is discharged to home with extensive instructions on wound care and progressive ambulation.  They are instructed not to drive or perform any heavy lifting until returning to see the physician in his office.  Discharge Diagnosis:  CHF,anemia,pericarditis pericardial  effusion  Secondary Diagnosis: Patient Active Problem List  Diagnosis  . 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 GERD (gastroesophageal reflux disease)  . Hepatic fibrosis  . Abdominal pain, chronic, right lower quadrant  . Bilateral kidney stones  . Hypertension  . Anemia  . Fluid overload  . Pericarditis with effusion  . Long-term use of immunosuppressant medication  . Pleural effusion   Past Medical History  Diagnosis Date  .  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 Hepatomegaly 05/04/2011  . Fatty liver 05/04/2011    "stage III fatty liver fibrosis" (11/15/2012)  . GERD (gastroesophageal reflux disease)   . Chronic liver disease     /notes 11/15/2012  . Pericarditis     [name] 11/15/2012  . Hypertension   . Pneumonia 1977  . Shortness of breath     "all the time right now" (11/15/2012)  . History of blood transfusion 2004  . History of stomach ulcers   . Duodenal ulcer   . Depression   . Kidney stones     bilaterally/notes 11/15/2012  . Hepatic fibrosis     [name] 11/15/2012  . Anemia   . Pericardial effusion 10/29/2012    moderate to large/notes 11/15/2012       David [name], David [name]  Home Medication Instructions [id number]   Printed on:11/22/12 1038  Medication Information                    promethazine (PHENERGAN) 25 MG tablet Take 25 mg by mouth every 6 (six) hours as needed. Nausea/vomiting           Probiotic Product (ALIGN) 4 MG CAPS Take 4 mg by mouth daily.            Multiple Vitamins-Minerals (MULTIVITAMINS THER. W/MINERALS) TABS Take 1 tablet by mouth daily.             Oxycodone HCl 20 MG TABS Take 10 mg by mouth 3 (three) times daily. pain           albuterol (PROVENTIL HFA;VENTOLIN HFA) 108 (90 BASE) MCG/ACT inhaler Inhale 2 puffs into the lungs every 6 (six) hours as needed. breathing           pantoprazole (PROTONIX) 40 MG  tablet Take 1 tablet (40 mg total) by mouth daily.           fish oil-omega-3 fatty acids 1000 MG capsule Take 1 g by mouth 3 (three) times daily.           spironolactone (ALDACTONE) 50 MG tablet Take 50 mg by mouth daily.           potassium chloride (K-DUR) 10 MEQ tablet Take 20 mEq by mouth daily.           oxymorphone (OPANA ER, CRUSH RESISTANT,) 10 MG T12A 12 hr tablet Take 10 mg by mouth every 12 (twelve) hours.           aspirin 81 MG EC tablet Take 1 tablet (81 mg total) by mouth daily.           colchicine 0.6 MG tablet Take 1 tablet (0.6 mg total) by mouth 2 (two) times daily.           ferrous sulfate 325 (65 FE) MG tablet Take 1 tablet (325 mg total) by mouth 3 (three) times daily with meals.           furosemide (LASIX) 40 MG tablet Take 1 tablet (40 mg total) by mouth daily.           ursodiol (ACTIGALL) 300 MG capsule Take 2 capsules (600 mg total) by mouth 2 (two) times daily.            Follow-up Information    Follow up with David Slot, MD. (12/13/2012 1245 Dr. Dorris Fetch . Please obtain a chest x-ray at [facility] at  11:45 PM. [facility] is located in the same office complex. Additionally an appointment with nurse on 11/29/2012 at 10 AM for suture removall)    Contact information:   [address] [employer] [address] [place] [address] [phone]       Follow up with Noel Gerold, MD. On 12/27/2012. (1 PM.  This is the GI specialist. )    Contact information:   [address] [place] [address] [phone]          Disposition: Discharged home  Patient's condition is Juliette Alcide, PA-C 11/22/2012  10:38 AM

## 2012-11-24 ENCOUNTER — Other Ambulatory Visit: Payer: Self-pay | Admitting: *Deleted

## 2012-11-24 DIAGNOSIS — J90 Pleural effusion, not elsewhere classified: Secondary | ICD-10-CM

## 2012-11-28 ENCOUNTER — Encounter (INDEPENDENT_AMBULATORY_CARE_PROVIDER_SITE_OTHER): Payer: Self-pay | Admitting: Internal Medicine

## 2012-11-28 ENCOUNTER — Ambulatory Visit (INDEPENDENT_AMBULATORY_CARE_PROVIDER_SITE_OTHER): Payer: Medicare Other | Admitting: Internal Medicine

## 2012-11-28 VITALS — BP 128/74 | HR 78 | Temp 97.2°F | Resp 20 | Ht 71.0 in | Wt 220.4 lb

## 2012-11-28 DIAGNOSIS — K5090 Crohn's disease, unspecified, without complications: Secondary | ICD-10-CM

## 2012-11-28 DIAGNOSIS — E8809 Other disorders of plasma-protein metabolism, not elsewhere classified: Secondary | ICD-10-CM

## 2012-11-28 DIAGNOSIS — D649 Anemia, unspecified: Secondary | ICD-10-CM

## 2012-11-28 DIAGNOSIS — R600 Localized edema: Secondary | ICD-10-CM

## 2012-11-28 DIAGNOSIS — R609 Edema, unspecified: Secondary | ICD-10-CM

## 2012-11-28 NOTE — Patient Instructions (Signed)
Continue high protein low salt diet. Call if weight up by more than 5 pounds. Physician will contact you with results of blood work.

## 2012-11-28 NOTE — Progress Notes (Signed)
Presenting complaint;  Patient complains of abdominal distention and lower extremity edema. He has history of Crohn's disease and recently hospitalized for progressive pericardial effusion.  Subjective:  Patient is 36 year old Caucasian male who was several year history of Crohn's disease who has been on various therapies including Remicade would develop pericardial effusion and was hospitalized at [facility] between 11/15/2012 and felt 10/02/2012. He underwent video-assisted thoracoscope E. on 11/17/2012 by Dr. Charlett Lango. He had 2 L of pleural fluid removed from left pleural cavity along with 300 mL of sanguinous fluid from the pericardial cavity and pericardial window created. Allises and the fluid has been negative for bacterial or fungal cultures. Final on AFB culture is pending. Epicardial biopsy revealed acute pericarditis but no granulomas are identified. Patient continues to complain of abdominal distention and lower extremity edema. However he has not gained any weight since he was discharged from the hospital 6 days ago. He denies dyspnea at rest or walking on a flat surface at home. He continues to have low-grade fever between 99 and 100. He denies cough. He has some chest soreness. He is scheduled to have sutures removed tomorrow. He did experience nosebleed 4 days ago and passed some blood per rectum last week. He complains of nausea. His appetite is fair. He experienced right flank pain over the weekend and believes he may have passed a stone. He did not experience hematuria. He also fell on the morning of 11/23/2012. He tripped over a rug and likely experience no laceration. Patient received last dose of Remicade on September 30,2013. He is accompanied by his mother today. He wants me to write his prescription for pain medication which is being supervised by his mother.  Current Medications: Current Outpatient Prescriptions  Medication Sig Dispense Refill  . albuterol (PROVENTIL  HFA;VENTOLIN HFA) 108 (90 BASE) MCG/ACT inhaler Inhale 2 puffs into the lungs every 6 (six) hours as needed. breathing      . aspirin 81 MG EC tablet Take 1 tablet (81 mg total) by mouth daily.  30 tablet    . colchicine 0.6 MG tablet Take 1 tablet (0.6 mg total) by mouth 2 (two) times daily.  60 tablet  1  . ferrous sulfate 325 (65 FE) MG tablet Take 1 tablet (325 mg total) by mouth 3 (three) times daily with meals.  90 tablet  1  . fish oil-omega-3 fatty acids 1000 MG capsule Take 1 g by mouth 3 (three) times daily.      . furosemide (LASIX) 40 MG tablet Take 1 tablet (40 mg total) by mouth daily.  30 tablet  1  . Multiple Vitamins-Minerals (MULTIVITAMINS THER. W/MINERALS) TABS Take 1 tablet by mouth daily.        . Oxycodone HCl 20 MG TABS Take 10 mg by mouth 3 (three) times daily. pain      . oxymorphone (OPANA ER, CRUSH RESISTANT,) 10 MG T12A 12 hr tablet Take 10 mg by mouth every 12 (twelve) hours.      . pantoprazole (PROTONIX) 40 MG tablet Take 1 tablet (40 mg total) by mouth daily.  30 tablet  4  . potassium chloride (K-DUR) 10 MEQ tablet Take 20 mEq by mouth daily.      . Probiotic Product (ALIGN) 4 MG CAPS Take 4 mg by mouth daily.       . promethazine (PHENERGAN) 25 MG tablet Take 25 mg by mouth every 6 (six) hours as needed. Nausea/vomiting      . spironolactone (ALDACTONE) 50 MG  tablet Take 50 mg by mouth daily.      . ursodiol (ACTIGALL) 300 MG capsule Take 2 capsules (600 mg total) by mouth 2 (two) times daily.  120 capsule  1     Objective: Blood pressure 128/74, pulse 78, temperature 97.2 F (36.2 C), temperature source Oral, resp. rate 20, height 5\' 11"  (1.803 m), weight 220 lb 6.4 oz (99.973 kg). Patient appears chronically ill. He is not in any distress. Conjunctiva is pale. Sclera is nonicteric Oropharyngeal mucosa is normal. No neck masses or thyromegaly noted. No induration noted to surgical incision site at left chest wall. Cardiac exam with regular rhythm normal S1  and S2. No murmur, rub or gallop noted. Lungs are clear to auscultation. Abdomen is protuberant. It is soft with mild tenderness at right lower quadrant. Liver edge is firm. He has 2+ pitting edema to his lower extremities. He has some edema involving his thighs as well.  Labs/studies Results: Lab data from 11/20/2012 WBC 6.3, H&H 8.5 and 26.2 and platelet count 271K. Metabolic 7 from 11/22/2012 electrolytes within normal limits. BUN 11 creatinine 0.66 calcium 8.3. Serum albumin 1.9, AST 44and ALT17 on 11/22/2012.   Assessment:  #1. Acute pericardial effusion. He underwent pericardial window on 11/17/2012. Lab C. and cultures on nonrevealing. Finally on AFP is pending. Ossicle etiologies include secondary to Pentasa, atypical infection given that he has been on Remicade as well as autoimmune pericarditis. Fluid overload appears to be stable and H&H improved as hypoalbuminemia corrects. Unless he experiences weight gain will not increase diuretic therapy. He will continue to seen as recommended by Dr. Valera Castle. #2. Crohn's disease. He is presently on no therapy for maintenance. Should he experience a relapse will treat him with budesonide. #3. Chronic abdominal pain. Currently he is getting prescriptions from Dr. Ronal Fear. I will check with him and may consider writing his prescriptions as long as his mother was supervised therapy. #4. Acute on chronic anemia secondary to acute illness.   Plan:  He will go to the lab for CBC with differential, CRP, metabolic 7 and serum albumin laser this week. Patient advised to increase intake of protein rich foods. He will call office if he has gained more than 5 pounds. Office visit in in one month.

## 2012-11-29 ENCOUNTER — Ambulatory Visit (INDEPENDENT_AMBULATORY_CARE_PROVIDER_SITE_OTHER): Payer: Self-pay

## 2012-11-29 ENCOUNTER — Other Ambulatory Visit (INDEPENDENT_AMBULATORY_CARE_PROVIDER_SITE_OTHER): Payer: Self-pay | Admitting: *Deleted

## 2012-11-29 DIAGNOSIS — I2510 Atherosclerotic heart disease of native coronary artery without angina pectoris: Secondary | ICD-10-CM

## 2012-11-29 DIAGNOSIS — Z4802 Encounter for removal of sutures: Secondary | ICD-10-CM

## 2012-11-29 NOTE — Progress Notes (Signed)
Removed 4 sutures from chest tube sites, no signs of infection and pt tolerated well. Appt scheduled with Dr Dorris Fetch on 12/13/12 with CXR prior, pt aware.

## 2012-11-29 NOTE — Telephone Encounter (Signed)
David [name] called the office and states that he needs a refill on his Phenergan 25 mg. Per Dr.Rehman may call in the following: Phenergan 25 mg Take 1 by mouth every 12 hours #25 , no refills  This was called to [facility]/[name] was called and made aware

## 2012-11-30 ENCOUNTER — Telehealth (INDEPENDENT_AMBULATORY_CARE_PROVIDER_SITE_OTHER): Payer: Self-pay | Admitting: *Deleted

## 2012-11-30 ENCOUNTER — Ambulatory Visit: Payer: BC Managed Care – PPO | Admitting: Cardiology

## 2012-11-30 NOTE — Telephone Encounter (Signed)
Sabir saw the Nurse Practioner on  11/29/12 the day after ov with Dr.Rehman. Please refer to Dr.Rehman's note for recommendation/plan. This morning Lavenia Atlas NP called to say that Mr.Taber was seen and that he shared with her that Dr.Rehman was going to take over his pain management, I explained that Dr.Rehman was going to talk with Dr.Doohquah before making a final decision. She states that even with the patient having been hospitalized he should still have 8 days of medication left. Per [name] he had been advised at the Ed to take more to the Oxycodone as the physician knew that he was having a lot of pain with the fluid. Ms.Foster states that she did write for him 2 prescriptions for the Oxycodone and the Opana but that he was told that he could not fill it until 12-04-12. Ming states that he is out of the Oxycodone but has a few days of the Opana left. I called the [facility] and spoke with Genesi Stefanko--she states that he last had this filled on 10/31/12 and it was for 25 days written by Lavenia Atlas, NP..she checked and he had been compliant at [facility] since 05/2012. Dr.Rehman is aware and he plans to talk with Dr.Doohquah before making a final decision of taking over Voshon's pain medication. Kendarrius was called and advised.  Also Entocort HC was called into [facility]/Kella Splinter for the patient regarding his Crohn's He is to take 9 mg for 1 week , then 6 mg for 1 week, then 3 mg for 1 week. Patient is aware.

## 2012-12-02 LAB — BASIC METABOLIC PANEL
CO2: 26 mEq/L (ref 19–32)
Calcium: 8 mg/dL — ABNORMAL LOW (ref 8.4–10.5)
Glucose, Bld: 90 mg/dL (ref 70–99)
Sodium: 136 mEq/L (ref 135–145)

## 2012-12-02 LAB — CBC WITH DIFFERENTIAL/PLATELET
Eosinophils Absolute: 0.4 10*3/uL (ref 0.0–0.7)
Eosinophils Relative: 4 % (ref 0–5)
HCT: 29.9 % — ABNORMAL LOW (ref 39.0–52.0)
Hemoglobin: 9.3 g/dL — ABNORMAL LOW (ref 13.0–17.0)
Lymphs Abs: 1.1 10*3/uL (ref 0.7–4.0)
MCH: 24.6 pg — ABNORMAL LOW (ref 26.0–34.0)
MCV: 79.1 fL (ref 78.0–100.0)
Monocytes Absolute: 1.2 10*3/uL — ABNORMAL HIGH (ref 0.1–1.0)
Monocytes Relative: 12 % (ref 3–12)
Neutrophils Relative %: 74 % (ref 43–77)
RBC: 3.78 MIL/uL — ABNORMAL LOW (ref 4.22–5.81)

## 2012-12-05 ENCOUNTER — Ambulatory Visit (INDEPENDENT_AMBULATORY_CARE_PROVIDER_SITE_OTHER): Payer: Medicare Other | Admitting: Adult Health

## 2012-12-05 ENCOUNTER — Encounter: Payer: Self-pay | Admitting: Adult Health

## 2012-12-05 ENCOUNTER — Telehealth (INDEPENDENT_AMBULATORY_CARE_PROVIDER_SITE_OTHER): Payer: Self-pay | Admitting: *Deleted

## 2012-12-05 VITALS — BP 115/77 | HR 96 | Ht 71.0 in | Wt 227.8 lb

## 2012-12-05 DIAGNOSIS — I319 Disease of pericardium, unspecified: Secondary | ICD-10-CM

## 2012-12-05 DIAGNOSIS — I10 Essential (primary) hypertension: Secondary | ICD-10-CM

## 2012-12-05 DIAGNOSIS — K5090 Crohn's disease, unspecified, without complications: Secondary | ICD-10-CM

## 2012-12-05 DIAGNOSIS — E8770 Fluid overload, unspecified: Secondary | ICD-10-CM

## 2012-12-05 DIAGNOSIS — J90 Pleural effusion, not elsewhere classified: Secondary | ICD-10-CM

## 2012-12-05 DIAGNOSIS — D649 Anemia, unspecified: Secondary | ICD-10-CM

## 2012-12-05 DIAGNOSIS — E8779 Other fluid overload: Secondary | ICD-10-CM

## 2012-12-05 NOTE — Assessment & Plan Note (Signed)
Excellent control of BP with only lasix and spironolactone. BP  Soft at 115/77. Will not add any other agents at this time. If remains tachycardic, consider adding low dose BB at Dr.Wall's discretion.

## 2012-12-05 NOTE — Progress Notes (Deleted)
Name: David [name]    DOB: 11/11/1976  Age: 36 y.o.  MR#: [id number]       PCP:  [name], MD      Insurance: @PAYORNAME @   CC:   No chief complaint on file.   VS BP 115/77  Pulse 96  Ht 5\' 11"  (1.803 m)  Wt 227 lb 12 oz (103.307 kg)  BMI 31.76 kg/m2  SpO2 96%  Weights Current Weight  12/05/12 227 lb 12 oz (103.307 kg)  11/28/12 220 lb 6.4 oz (99.973 kg)  11/22/12 209 lb 7 oz (95 kg)    Blood Pressure  BP Readings from Last 3 Encounters:  12/05/12 115/77  11/28/12 128/74  11/22/12 105/61     Admit date:  (Not on file) Last encounter with RMR:  11/15/2012   Allergy Allergies  Allergen Reactions  . Morphine And Related Hives    Current Outpatient Prescriptions  Medication Sig Dispense Refill  . albuterol (PROVENTIL HFA;VENTOLIN HFA) 108 (90 BASE) MCG/ACT inhaler Inhale 2 puffs into the lungs every 6 (six) hours as needed. breathing      . aspirin 81 MG EC tablet Take 1 tablet (81 mg total) by mouth daily.  30 tablet    . budesonide (ENTOCORT EC) 3 MG 24 hr capsule Take 3 mg by mouth. TAKE 3 CAPSULES DAILY FOR ONE WEEK, TAKE 2 CAPSULES DAILY FOR ONE WEEK, TAKE ONE CAPSULE DAILY FOR ONE WEEK      . colchicine 0.6 MG tablet Take 1 tablet (0.6 mg total) by mouth 2 (two) times daily.  60 tablet  1  . ferrous sulfate 325 (65 FE) MG tablet Take 1 tablet (325 mg total) by mouth 3 (three) times daily with meals.  90 tablet  1  . fish oil-omega-3 fatty acids 1000 MG capsule Take 1 g by mouth 3 (three) times daily.      . furosemide (LASIX) 40 MG tablet Take 80 mg by mouth 2 (two) times daily.       . Multiple Vitamins-Minerals (MULTIVITAMINS THER. W/MINERALS) TABS Take 1 tablet by mouth daily.        . Oxycodone HCl 20 MG TABS Take 10 mg by mouth 3 (three) times daily. pain      . oxymorphone (OPANA ER, CRUSH RESISTANT,) 10 MG T12A 12 hr tablet Take 10 mg by mouth every 12 (twelve) hours.      . pantoprazole (PROTONIX) 40 MG tablet Take 1 tablet (40 mg total) by mouth daily.   30 tablet  4  . potassium chloride (K-DUR) 10 MEQ tablet Take 20 mEq by mouth daily.       . Probiotic Product (ALIGN) 4 MG CAPS Take 4 mg by mouth daily.       . promethazine (PHENERGAN) 25 MG tablet Take 25 mg by mouth every 6 (six) hours as needed. Nausea/vomiting      . spironolactone (ALDACTONE) 50 MG tablet Take 50 mg by mouth daily.       . ursodiol (ACTIGALL) 300 MG capsule Take 2 capsules (600 mg total) by mouth 2 (two) times daily.  120 capsule  1  . dicyclomine (BENTYL) 20 MG tablet       . naproxen (NAPROSYN) 500 MG tablet         Discontinued Meds:   There are no discontinued medications.  Patient Active Problem List  Diagnosis  . 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 GERD (gastroesophageal reflux disease)  .  Hepatic fibrosis  . Abdominal pain, chronic, right lower quadrant  . Bilateral kidney stones  . Hypertension  . Anemia  . Fluid overload  . Pericarditis with effusion  . Long-term use of immunosuppressant medication  . Pleural effusion    LABS Office Visit on 11/28/2012  Component Date Value  . WBC 12/02/2012 10.4   . RBC 12/02/2012 3.78*  . Hemoglobin 12/02/2012 9.3*  . HCT 12/02/2012 29.9*  . MCV 12/02/2012 79.1   . MCH 12/02/2012 24.6*  . MCHC 12/02/2012 31.1   . RDW 12/02/2012 18.0*  . Platelets 12/02/2012 298   . Neutrophils Relative 12/02/2012 74   . Neutro Abs 12/02/2012 7.7   . Lymphocytes Relative 12/02/2012 10*  . Lymphs Abs 12/02/2012 1.1   . Monocytes Relative 12/02/2012 12   . Monocytes Absolute 12/02/2012 1.2*  . Eosinophils Relative 12/02/2012 4   . Eosinophils Absolute 12/02/2012 0.4   . Basophils Relative 12/02/2012 0   . Basophils Absolute 12/02/2012 0.0   . Smear Review 12/02/2012 Criteria for review not met   . Sodium 12/02/2012 136   . Potassium 12/02/2012 4.6   . Chloride 12/02/2012 102   . CO2 12/02/2012 26   . Glucose, Bld 12/02/2012 90   . BUN 12/02/2012 12   . Creat 12/02/2012 0.67   .  Calcium 12/02/2012 8.0*  . Albumin 12/02/2012 2.5*  . CRP 12/02/2012 4.5*  Admission on 11/15/2012, Discharged on 11/22/2012  No results displayed because visit has over 200 results.    Admission on 10/29/2012, Discharged on 10/29/2012  Component Date Value  . Troponin I 10/29/2012 1.15*  . WBC 10/29/2012 8.4   . RBC 10/29/2012 4.22   . Hemoglobin 10/29/2012 11.3*  . HCT 10/29/2012 34.5*  . MCV 10/29/2012 81.8   . [facility] 10/29/2012 26.8   . MCHC 10/29/2012 32.8   . RDW 10/29/2012 23.4*  . Platelets 10/29/2012 271   . Neutrophils Relative 10/29/2012 71   . Lymphocytes Relative 10/29/2012 13   . Monocytes Relative 10/29/2012 13*  . Eosinophils Relative 10/29/2012 3   . Basophils Relative 10/29/2012 0   . Neutro Abs 10/29/2012 5.9   . Lymphs Abs 10/29/2012 1.1   . Monocytes Absolute 10/29/2012 1.1*  . Eosinophils Absolute 10/29/2012 0.3   . Basophils Absolute 10/29/2012 0.0   . Smear Review 10/29/2012 MORPHOLOGY UNREMARKABLE   . Sodium 10/29/2012 134*  . Potassium 10/29/2012 4.5   . Chloride 10/29/2012 97   . CO2 10/29/2012 27   . Glucose, Bld 10/29/2012 104*  . BUN 10/29/2012 10   . Creatinine, Ser 10/29/2012 0.70   . Calcium 10/29/2012 9.4   . Total Protein 10/29/2012 6.9   . Albumin 10/29/2012 3.1*  . AST 10/29/2012 46*  . ALT 10/29/2012 33   . Alkaline Phosphatase 10/29/2012 112   . Total Bilirubin 10/29/2012 0.7   . GFR calc non Af Amer 10/29/2012 >90   . GFR calc Af Amer 10/29/2012 >90   . D-Dimer, Quant 10/29/2012 2.80*  Office Visit on 09/20/2012  Component Date Value  . WBC 10/04/2012 7.1   . RBC 10/04/2012 4.50   . Hemoglobin 10/04/2012 11.5*  . HCT 10/04/2012 36.5*  . MCV 10/04/2012 81.1   . MCH 10/04/2012 25.6*  . MCHC 10/04/2012 31.5   . RDW 10/04/2012 24.4*  . Platelets 10/04/2012 133*  . Neutrophils Relative 10/04/2012 64   . Neutro Abs 10/04/2012 4.5   . Lymphocytes Relative 10/04/2012 24   . Lymphs Abs 10/04/2012 1.7   .  Monocytes Relative  10/04/2012 9   . Monocytes Absolute 10/04/2012 0.6   . Eosinophils Relative 10/04/2012 3   . Eosinophils Absolute 10/04/2012 0.2   . Basophils Relative 10/04/2012 0   . Basophils Absolute 10/04/2012 0.0   . Smear Review 10/04/2012 Criteria for review not met   . Sodium 10/04/2012 142   . Potassium 10/04/2012 4.0   . Chloride 10/04/2012 104   . CO2 10/04/2012 27   . Glucose, Bld 10/04/2012 147*  . BUN 10/04/2012 10   . Creat 10/04/2012 0.68   . Total Bilirubin 10/04/2012 0.5   . Alkaline Phosphatase 10/04/2012 112   . AST 10/04/2012 78*  . ALT 10/04/2012 81*  . Total Protein 10/04/2012 6.7   . Albumin 10/04/2012 3.7   . Calcium 10/04/2012 8.9   . CRP 10/04/2012 3.4*  Orders Only on 09/06/2012  Component Date Value  . WBC 09/06/2012 5.8   . RBC 09/06/2012 3.84*  . Hemoglobin 09/06/2012 8.6*  . HCT 09/06/2012 27.8*  . MCV 09/06/2012 72.4*  . [facility] 09/06/2012 22.4*  . MCHC 09/06/2012 30.9   . RDW 09/06/2012 17.7*  . Platelets 09/06/2012 166   . Sodium 09/06/2012 138   . Potassium 09/06/2012 4.4   . Chloride 09/06/2012 102   . CO2 09/06/2012 27   . Glucose, Bld 09/06/2012 95   . BUN 09/06/2012 12   . Creat 09/06/2012 0.65   . Total Bilirubin 09/06/2012 0.6   . Alkaline Phosphatase 09/06/2012 124*  . AST 09/06/2012 75*  . ALT 09/06/2012 42   . Total Protein 09/06/2012 6.2   . Albumin 09/06/2012 3.3*  . Calcium 09/06/2012 8.6   . Hemoglobin A1C 09/06/2012 6.1*  . Mean Plasma Glucose 09/06/2012 128*  Office Visit on 09/06/2012  Component Date Value  . AFP-Tumor Marker 09/06/2012 4.0   . Prothrombin Time 09/06/2012 14.3   . INR 09/06/2012 1.11      Results for this Opt Visit:     Results for orders placed in visit on 11/28/12  CBC WITH DIFFERENTIAL      Component Value Range   WBC 10.4  4.0 - 10.5 K/uL   RBC 3.78 (*) 4.22 - 5.81 MIL/uL   Hemoglobin 9.3 (*) 13.0 - 17.0 g/dL   HCT 40.9 (*) 81.1 - 91.4 %   MCV 79.1  78.0 - 100.0 fL   MCH 24.6 (*) 26.0 - 34.0 pg    MCHC 31.1  30.0 - 36.0 g/dL   RDW 78.2 (*) 95.6 - 21.3 %   Platelets 298  150 - 400 K/uL   Neutrophils Relative 74  43 - 77 %   Neutro Abs 7.7  1.7 - 7.7 K/uL   Lymphocytes Relative 10 (*) 12 - 46 %   Lymphs Abs 1.1  0.7 - 4.0 K/uL   Monocytes Relative 12  3 - 12 %   Monocytes Absolute 1.2 (*) 0.1 - 1.0 K/uL   Eosinophils Relative 4  0 - 5 %   Eosinophils Absolute 0.4  0.0 - 0.7 K/uL   Basophils Relative 0  0 - 1 %   Basophils Absolute 0.0  0.0 - 0.1 K/uL   Smear Review Criteria for review not met    BASIC METABOLIC PANEL      Component Value Range   Sodium 136  135 - 145 mEq/L   Potassium 4.6  3.5 - 5.3 mEq/L   Chloride 102  96 - 112 mEq/L   CO2 26  19 - 32 mEq/L  Glucose, Bld 90  70 - 99 mg/dL   BUN 12  6 - 23 mg/dL   Creat 4.54  0.98 - 1.19 mg/dL   Calcium 8.0 (*) 8.4 - 10.5 mg/dL  ALBUMIN      Component Value Range   Albumin 2.5 (*) 3.5 - 5.2 g/dL  C-REACTIVE PROTEIN      Component Value Range   CRP 4.5 (*) <0.60 mg/dL    EKG Orders placed in visit on 12/05/12  . 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    Abdominal pain, chronic, right lower quadrant   Bilateral kidney stones   Hypertension   Last Assessment & Plan Note   11/15/2012 Office Visit Signed 11/15/2012  4:14 PM by Jodelle Gross, NP    He is hypotensive with significant fluid overload. He will be admitted to Step Down at [facility].    Anemia   Fluid overload   Last Assessment & Plan Note   11/02/2012 Office Visit Signed 11/02/2012 10:25 AM by Dyann Kief, PA    Patient has significant fluid overload since his Lasix was  decreased. Will increase his Lasix to 80 mg daily increase his K-Dur 20 mEq daily.    Pericarditis with effusion   Last Assessment & Plan Note   11/15/2012 Office Visit Signed 11/15/2012  4:13 PM by Jodelle Gross, NP    This is still prominent per echocardigram, no evidence tampanade per echo completed in clinic room. He has substantial fluid overload and will need to be admitted for further observation, diuresis and testing. I have discussed this with the patient and his wife. They are willing and ready to have him admitted. He will go to [facility] in the step down unit.     Long-term use of immunosuppressant medication   Pleural effusion       Imaging: X-ray Chest Pa And Lateral   11/15/2012  *RADIOLOGY REPORT*  Clinical Data: Fever.  Pericarditis.  Crohn's disease.  CHEST - 2 VIEW  Comparison: 10/29/2012  Findings: Increased moderate left pleural effusion is seen with left lower lung atelectasis.  Cardiac enlargement again demonstrated.  Increased atelectasis seen in the right perihilar region.   Small pleural based opacity in the right lower hemithorax is stable.  IMPRESSION:  1.  Increased moderate left pleural effusion and left lower lung atelectasis. 2. Persistent cardiac enlargement and right lateral pleural based opacity.   Original Report Authenticated By: Myles Rosenthal, M.D.    US Abdomen Limited  11/16/2012  *RADIOLOGY REPORT*  Clinical Data: Ascites. Cirrhosis.  LIMITED ABDOMEN ULTRASOUND FOR ASCITES  Technique:  Limited ultrasound survey for ascites was performed in all four abdominal quadrants.  Comparison:  None.  Findings: Mild ascites is seen mainly in the left lower quadrant. In addition, a sizable left pleural effusion is demonstrated.  IMPRESSION: Mild ascites mainly in left lower quadrant.  Left pleural effusion also noted.   Original Report Authenticated By: Myles Rosenthal, M.D.    Dg Chest Port 1 View  11/22/2012  *RADIOLOGY REPORT*  Clinical  Data: Post left chest tube  removal  PORTABLE CHEST - 1 VIEW  Comparison: Earlier same day; 11/21/2012  Findings:  Grossly unchanged enlarged cardiac silhouette and mediastinal contours.  Interval removal of left basilar chest tube without definite pneumothorax.  Lung volumes remain reduced with grossly unchanged left basilar heterogeneous opacities.  No new focal airspace opacity.  Trace left-sided effusion is unchanged. Unchanged bones.  IMPRESSION: 1.  Interval removal of left basilar chest tube without definite pneumothorax. 2.  Persistently reduced lung volumes with unchanged trace left- sided effusion and left basilar opacities likely atelectasis.   Original Report Authenticated By: Tacey Ruiz, MD    Dg Chest Port 1 View  11/22/2012  *RADIOLOGY REPORT*  Clinical Data:  Status post pericardial window  PORTABLE CHEST - 1 VIEW  Comparison: 11/21/2012  Findings: Left inferior hemithorax with coarse reticular presumed atelectasis.  A left lung base chest tube is stable.  On the prior study, a second chest tube curve over the left the diaphragm.  This has been removed.  The left apical pneumothorax seen on the previous day's study is not evident on the current exam.  Mild medial right lung base opacities likely atelectasis.  No pulmonary edema.  Cardiac silhouette is normal in size and configuration.  IMPRESSION: Two chest tubes were evident on the prior study.  On the current exam, only one is seen, presumably the other has been removed.  Small left apical pneumothorax seen on the previous day's study is no longer evident.  Persistent mild lung base atelectasis.   Original Report Authenticated By: Amie Portland, M.D.    Dg Chest Port 1 View  11/21/2012  *RADIOLOGY REPORT*  Clinical Data: Postop tube check  PORTABLE CHEST - 1 VIEW  Comparison: Chest radiograph 11/20/2012  Findings: Stable enlarged cardiac silhouette.  Chest tube at the left lung bases unchanged.  There is a left apical pneumothorax which is small in volume with the  pleural edge measuring 7 mm from the apical chest wall.  Mild basilar atelectasis.  IMPRESSION:  1. 1.  Small left apical pneumothorax with chest tube in place.  This was not evident on comparison exam. 2.  Cardiomegaly and basilar atelectasis.  This was made a call report.   Original Report Authenticated By: Genevive Bi, M.D.    Dg Chest Port 1 View  11/20/2012  *RADIOLOGY REPORT*  Clinical Data: Status post left thoracotomy.  PORTABLE CHEST - 1 VIEW  Comparison: 11/19/2012  Findings: Low lung volumes are again noted with some mild basilar atelectasis mostly on the left.  There are dual left inferior hemithorax chest tubes there are stable.  A right internal jugular central venous line has its tip in the mid to lower superior vena cava, also stable.  No pneumothorax.  The cardiac silhouette is mildly enlarged, but unchanged.  IMPRESSION: No change from the previous day's study.  Mild basilar atelectasis mostly on the left.  No pneumothorax.  Support apparatus is stable.   Original Report Authenticated By: Amie Portland, M.D.    Dg Chest Port 1 View  11/19/2012  *RADIOLOGY REPORT*  Clinical Data: Chest tube.  Status post VATS.  PORTABLE CHEST - 1 VIEW  Comparison: Chest 11/18/2012.  Findings: Right IJ catheter and two left chest tubes remain in place.  There is no pneumothorax.  Enlargement cardiopericardial silhouette appears unchanged.  Small bilateral pleural effusions, larger on the left, and basilar atelectasis appear unchanged.  IMPRESSION: No interval change.  Negative for pneumothorax with two left  chest tube in place.   Original Report Authenticated By: Holley Dexter, M.D.    Dg Chest Port 1 View  11/18/2012  *RADIOLOGY REPORT*  Clinical Data: Status post VATS and pericardial window.  PORTABLE CHEST - 1 VIEW  Comparison: Chest 11/17/2012 and 11/15/2012.  Findings: Right IJ catheter and left chest tubes remain in place. No pneumothorax.  Very small left pleural effusion is noted. Basilar  atelectasis, greater on the left, persists but has improved.  Marked enlargement of the cardiopericardial silhouette is unchanged.  IMPRESSION:  1.  Negative for pneumothorax with two left chest tubes in place. 2.  Some improvement in bibasilar atelectasis, worse on the left.   Original Report Authenticated By: Holley Dexter, M.D.    Dg Chest Portable 1 View  11/17/2012  *RADIOLOGY REPORT*  Clinical Data: Postop.  Evaluate chest tube and line placement.  PORTABLE CHEST - 1 VIEW  Comparison: 11/15/2012  Findings:  There are to new left inferior hemithorax chest tubes. Most of the left pleural effusion has been evacuated since the prior study.  There is a new right internal jugular central venous line with its tip in the lower superior vena cava.  There is no pneumothorax.  There are coarse reticular opacities in the right mid lung and both lung bases, likely atelectasis.  There is some hazy opacity laterally along the right lower hemithorax which may reflect some loculated pleural fluid.  No overt pulmonary edema.  The cardiac silhouette is mildly enlarged.  IMPRESSION: To inferior left hemithorax chest tubes have mostly evacuated the previously seen left pleural effusion.  There is no pneumothorax. The right internal jugular central venous line is well positioned.   Original Report Authenticated By: Amie Portland, M.D.      [facility] Calculation: Score not calculated. Missing: Total Cholesterol, HDL

## 2012-12-05 NOTE — Patient Instructions (Addendum)
Your physician recommends that you schedule a follow-up appointment in: ONE MONTH WITH TW  Your physician recommends that you return for lab work in: 2 WEEKS (CBC,BMET) SLIPS GIVEN TODAY

## 2012-12-05 NOTE — Assessment & Plan Note (Signed)
Significant improvement in status with most recent Hgb  9.3 and Hct 29.9. He is no longer pale. Continued assessment and treatment by Dr. Karilyn Cota.

## 2012-12-05 NOTE — Assessment & Plan Note (Signed)
Much improved symptoms after VATS removing 2 liters of fluid per Dr. Dorris Fetch on 12./05/2012. He continues some mild fatigue, but lungs are clear, he is able to lie flat on the exam table without complaints of dyspnea. He will have follow up CXR in one week. CT incision is well healed.

## 2012-12-05 NOTE — Assessment & Plan Note (Signed)
Improved with 25 lb wt loss since last visit. He has continued non-pitting edema in the upper thighs and pre-tibial area. He is keeping his legs elevated as much as possible and continues on diuretics. Lasix 80 mg BID with spironolactone.  Good renal fx without evidence of hyperkalemia on recent blood work. He will have labs drawn before next visit.

## 2012-12-05 NOTE — Assessment & Plan Note (Signed)
He has had substantial improvement of symptoms s/p pericardial window and removal of 300 cc of sero/sanguious fluid. Operative site is clean and well healed without evidence of infection or bleeding. Will not plan a repeat echo at this time. Follow up CXR is pending next week per Dr. Dorris Fetch. He is mildy tachycardic today with rate in the low 90's at rest. Considered adding low dose BB, but BP is soft presently. This may be related to deconditioning. Will monitor this on next visit. He is to see Dr. Daleen Squibb in 1 month, Jan, 15th. He remains on colchicine. Will have BMET drawn just prior to his visit with Dr. Daleen Squibb. Creatinine on the 12/02/2012 was  0.67.

## 2012-12-05 NOTE — Progress Notes (Signed)
HPI: Mr. Yoho is a 36 y/o patient of Dr's Wall and Rothbart we are seeing on post hospital follow up after admission from our office on 11/15/2012 with significant pleural effusion, pericarditis with effusion,  ascites, and long term use of NSAIDs anemia and immunosuppresant medications for Crohns disease. He had VATS completed per Dr. Dorris Fetch on 11/18/2012 removing 2/L of pleural fluid with placement of CT, and also a pericardial window with removal of 300 mg of sero-sanguinous bloody fluid for diagnostic and relief of symptoms. He had substantial relief of symptoms post procedure and began a steady improvement over one week' s time. CT tube was removed and sutures from pericardial window have been removed.   He was treated with IV diuretics with a 25 lb weight loss. He was also seen by GI, Dr.Gessner, for management of Crohn's. Remicade and mesalamine were discontinued.   He is feeling much better and continuing to diurese on po diuretics and spironolactone. He is now on a tapered dose of budesonide over a 3 week period.  He still has some non-pitting edema, and mild abdominal distention. He has followed with Dr. Karilyn Cota who does not plan to do a paracentesis at this time.   Last labs drawn on 12/02/2012 demonstrate Hgb of 9.3, Hgb of 29.9.  Sodium of 136 and K+ of 4.6. Creatinine of 0.67. He is breathing and feeling much  better. He is able to lie flat without PND or orthopnea. He is still quite fatigued and tires easily, more due to deconditioning than breathing issues. He states that the swelling in his abdomen and his legs improves every day. He is urinating well without pain or incontinence. He is due to see Dr. Dorris Fetch on December 15, 2012 for follow up CXR. He offers no cardiac complaints of recurrent chest pain.   Allergies  Allergen Reactions  . Morphine And Related Hives    Current Outpatient Prescriptions  Medication Sig Dispense Refill  . albuterol (PROVENTIL HFA;VENTOLIN HFA) 108 (90  BASE) MCG/ACT inhaler Inhale 2 puffs into the lungs every 6 (six) hours as needed. breathing      . aspirin 81 MG EC tablet Take 1 tablet (81 mg total) by mouth daily.  30 tablet    . budesonide (ENTOCORT EC) 3 MG 24 hr capsule Take 3 mg by mouth. TAKE 3 CAPSULES DAILY FOR ONE WEEK, TAKE 2 CAPSULES DAILY FOR ONE WEEK, TAKE ONE CAPSULE DAILY FOR ONE WEEK      . colchicine 0.6 MG tablet Take 1 tablet (0.6 mg total) by mouth 2 (two) times daily.  60 tablet  1  . ferrous sulfate 325 (65 FE) MG tablet Take 1 tablet (325 mg total) by mouth 3 (three) times daily with meals.  90 tablet  1  . fish oil-omega-3 fatty acids 1000 MG capsule Take 1 g by mouth 3 (three) times daily.      . furosemide (LASIX) 40 MG tablet Take 80 mg by mouth 2 (two) times daily.       . Multiple Vitamins-Minerals (MULTIVITAMINS THER. W/MINERALS) TABS Take 1 tablet by mouth daily.        . Oxycodone HCl 20 MG TABS Take 10 mg by mouth 3 (three) times daily. pain      . oxymorphone (OPANA ER, CRUSH RESISTANT,) 10 MG T12A 12 hr tablet Take 10 mg by mouth every 12 (twelve) hours.      . pantoprazole (PROTONIX) 40 MG tablet Take 1 tablet (40 mg total) by mouth  daily.  30 tablet  4  . potassium chloride (K-DUR) 10 MEQ tablet Take 20 mEq by mouth daily.       . Probiotic Product (ALIGN) 4 MG CAPS Take 4 mg by mouth daily.       . promethazine (PHENERGAN) 25 MG tablet Take 25 mg by mouth every 6 (six) hours as needed. Nausea/vomiting      . spironolactone (ALDACTONE) 50 MG tablet Take 50 mg by mouth daily.       . ursodiol (ACTIGALL) 300 MG capsule Take 2 capsules (600 mg total) by mouth 2 (two) times daily.  120 capsule  1  . dicyclomine (BENTYL) 20 MG tablet       . naproxen (NAPROSYN) 500 MG tablet         Past Medical History  Diagnosis Date  . 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 Hepatomegaly 05/04/2011  . Fatty liver 05/04/2011    "stage  III fatty liver fibrosis" (11/15/2012)  . GERD (gastroesophageal reflux disease)   . Chronic liver disease     /notes 11/15/2012  . Pericarditis     [name] 11/15/2012  . Hypertension   . Pneumonia 1977  . Shortness of breath     "all the time right now" (11/15/2012)  . History of blood transfusion 2004  . History of stomach ulcers   . Duodenal ulcer   . Depression   . Kidney stones     bilaterally/notes 11/15/2012  . Hepatic fibrosis     [name] 11/15/2012  . Anemia   . Pericardial effusion 10/29/2012    moderate to large/notes 11/15/2012  . Anxiety   . Hepatitis   . Crohn's disease     Past Surgical History  Procedure Date  . Anal examination under anesthesia 02/11/2011    [name]  . Treatment fistula anal 07/03/11    This was a second surgery to repair Anal fistula  . Small intestine surgery 2004    "had hole cut in small intestines during endoscopy; had to have OR & leave me open 3 months" (11/15/2012)  . Cholecystectomy ~ 2003  . Appendectomy ~ 2003  . Video assisted thoracoscopy 11/17/2012    Procedure: VIDEO ASSISTED THORACOSCOPY;  Surgeon: Loreli Slot, MD;  Location: [facility] OR;  Service: Thoracic;  Laterality: Left;  drainage of left pleural effusion  . Pericardial window 11/17/2012    Procedure: PERICARDIAL WINDOW;  Surgeon: Loreli Slot, MD;  Location[facility] OR;  Service: Thoracic;  Laterality: N/A;    ZOX:WRUEAV of systems complete and found to be negative unless listed above  PHYSICAL EXAM BP 115/77  Pulse 96  Ht 5\' 11"  (1.803 m)  Wt 227 lb 12 oz (103.307 kg)  BMI 31.76 kg/m2  SpO2 96%  General: Well developed, well nourished, in no acute distress, pink cheeks, no longer pale Head: Eyes PERRLA, No xanthomas.   Normal cephalic and atramatic  Lungs: Clear bilaterally to auscultation and percussion. Heart: HRRR S1 S2, slightly tachycardic without MRG.  Pulses are 2+ & equal.            No carotid bruit. No JVD.  No abdominal bruits. No femoral  bruits. Abdomen: Bowel sounds are positive, abdomen non-tender with mild soft distention and slight ballotment without masses or                  Hernia's noted. Msk:  Back normal, normal gait.  Normal strength and tone for age. Extremities: No clubbing, cyanosis or non-pitting edema noted in the upper thighs and pre-tibial.  DP +1 Neuro: Alert and oriented X 3. Psych:  Good affect, responds appropriately  EKG: NSR with infero-lateral T-wave flattening.  ASSESSMENT AND PLAN

## 2012-12-05 NOTE — Telephone Encounter (Signed)
Per Dr.Rehman the patient will need to have lab work done one day prior to his appointment on 12/26/12. We will ask that he go on Saturday 12/24/12

## 2012-12-13 ENCOUNTER — Encounter: Payer: Self-pay | Admitting: Thoracic Surgery (Cardiothoracic Vascular Surgery)

## 2012-12-13 ENCOUNTER — Ambulatory Visit (INDEPENDENT_AMBULATORY_CARE_PROVIDER_SITE_OTHER): Payer: Self-pay | Admitting: Thoracic Surgery (Cardiothoracic Vascular Surgery)

## 2012-12-13 ENCOUNTER — Ambulatory Visit
Admission: RE | Admit: 2012-12-13 | Discharge: 2012-12-13 | Disposition: A | Discharge status: Home / Self Care | Payer: Medicare Other | Source: Ambulatory Visit | Attending: Thoracic Surgery (Cardiothoracic Vascular Surgery) | Admitting: Thoracic Surgery (Cardiothoracic Vascular Surgery)

## 2012-12-13 VITALS — BP 116/73 | HR 95 | Resp 16 | Ht 71.0 in | Wt 216.0 lb

## 2012-12-13 DIAGNOSIS — I313 Pericardial effusion (noninflammatory): Secondary | ICD-10-CM

## 2012-12-13 DIAGNOSIS — J90 Pleural effusion, not elsewhere classified: Secondary | ICD-10-CM

## 2012-12-13 DIAGNOSIS — I319 Disease of pericardium, unspecified: Secondary | ICD-10-CM

## 2012-12-13 DIAGNOSIS — Z09 Encounter for follow-up examination after completed treatment for conditions other than malignant neoplasm: Secondary | ICD-10-CM

## 2012-12-13 NOTE — Progress Notes (Signed)
HPI:  [name] returns today for a scheduled followup visit. He is a 36 year old gentleman with a history of Crohn's disease. He presented in late November early December with shortness of breath, leg swelling, and general malaise. He was eventually found to have large pericardial and left pleural effusions. He underwent left VATS for drainage of pleural effusion and pericardial window on 11/18/2012. His postoperative course was uncomplicated.  Since discharge the hospital he has done well. He says now he's not having any problems with his breathing or with leg swelling. He is having minimal discomfort related to his incision. Overall he feels much better than he did preoperatively.  Past Medical History  Diagnosis Date  . 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 Hepatomegaly 05/04/2011  . Fatty liver 05/04/2011    "stage III fatty liver fibrosis" (11/15/2012)  . GERD (gastroesophageal reflux disease)   . Chronic liver disease     /notes 11/15/2012  . Pericarditis     [name] 11/15/2012  . Hypertension   . Pneumonia 1977  . Shortness of breath     "all the time right now" (11/15/2012)  . History of blood transfusion 2004  . History of stomach ulcers   . Duodenal ulcer   . Depression   . Kidney stones     bilaterally/notes 11/15/2012  . Hepatic fibrosis     [name] 11/15/2012  . Anemia   . Pericardial effusion 10/29/2012    moderate to large/notes 11/15/2012  . Anxiety   . Hepatitis   . Crohn's disease       Current Outpatient Prescriptions  Medication Sig Dispense Refill  . albuterol (PROVENTIL HFA;VENTOLIN HFA) 108 (90 BASE) MCG/ACT inhaler Inhale 2 puffs into the lungs every 6 (six) hours as needed. breathing      . aspirin 81 MG EC tablet Take 1 tablet (81 mg total) by mouth daily.  30 tablet    . budesonide (ENTOCORT EC) 3 MG 24 hr capsule Take 3 mg by mouth. TAKE 3 CAPSULES DAILY FOR ONE  WEEK, TAKE 2 CAPSULES DAILY FOR ONE WEEK, TAKE ONE CAPSULE DAILY FOR ONE WEEK      . colchicine 0.6 MG tablet Take 1 tablet (0.6 mg total) by mouth 2 (two) times daily.  60 tablet  1  . dicyclomine (BENTYL) 20 MG tablet       . ferrous sulfate 325 (65 FE) MG tablet Take 1 tablet (325 mg total) by mouth 3 (three) times daily with meals.  90 tablet  1  . fish oil-omega-3 fatty acids 1000 MG capsule Take 1 g by mouth 3 (three) times daily.      . furosemide (LASIX) 40 MG tablet Take 80 mg by mouth 2 (two) times daily.       . Multiple Vitamins-Minerals (MULTIVITAMINS THER. W/MINERALS) TABS Take 1 tablet by mouth daily.        . Oxycodone HCl 20 MG TABS Take 10 mg by mouth 3 (three) times daily. pain      . oxymorphone (OPANA ER, CRUSH RESISTANT,) 10 MG T12A 12 hr tablet Take 10 mg by mouth every 12 (twelve) hours.      . pantoprazole (PROTONIX) 40 MG tablet Take 1 tablet (40 mg total) by mouth daily.  30 tablet  4  . potassium chloride (K-DUR) 10 MEQ tablet Take 20 mEq by mouth daily.       . Probiotic Product (ALIGN)  4 MG CAPS Take 4 mg by mouth daily.       . promethazine (PHENERGAN) 25 MG tablet Take 25 mg by mouth every 6 (six) hours as needed. Nausea/vomiting      . spironolactone (ALDACTONE) 50 MG tablet Take 50 mg by mouth daily.       . ursodiol (ACTIGALL) 300 MG capsule Take 2 capsules (600 mg total) by mouth 2 (two) times daily.  120 capsule  1    Physical Exam BP 116/73  Pulse 95  Resp 16  Ht 5\' 11"  (1.803 m)  Wt 216 lb (97.977 kg)  BMI 30.13 kg/m2  SpO56 69% 36 year old male in no acute distress. Lungs clear with equal breath sounds bilaterally Cardiac regular rate and rhythm normal S1-S2 no rub or murmur Incision well-healed as are the chest tube sites  Diagnostic Tests: Chest x-ray 12-13 CHEST - 2 VIEW  Comparison: 11/22/2012.  Findings: There is a small persistent left pleural effusion with  overlying atelectasis or scarring. No edema or pneumothorax. Heart  size is  stable to slightly smaller but no obvious pericardial  effusion. The bony thorax is intact.  IMPRESSION:  Stable small left pleural effusion with overlying atelectasis.   Impression: 36 year old gentleman now about 3 weeks out from left VATS for drainage of the pleural effusion and pericardial window. Pathology showed inflammation. All cultures have been negative to date. He feels well and is having minimal discomfort.  At this point there are no restrictions on him in terms of activities, although I did advise him to build into new activities gradually.  Plan: I will be happy to see [name] back any time if I can be of any further assistance with his care.

## 2012-12-15 ENCOUNTER — Other Ambulatory Visit (INDEPENDENT_AMBULATORY_CARE_PROVIDER_SITE_OTHER): Payer: Self-pay | Admitting: Internal Medicine

## 2012-12-15 ENCOUNTER — Telehealth (INDEPENDENT_AMBULATORY_CARE_PROVIDER_SITE_OTHER): Payer: Self-pay | Admitting: *Deleted

## 2012-12-15 DIAGNOSIS — R140 Abdominal distension (gaseous): Secondary | ICD-10-CM

## 2012-12-15 DIAGNOSIS — K5090 Crohn's disease, unspecified, without complications: Secondary | ICD-10-CM

## 2012-12-15 DIAGNOSIS — R110 Nausea: Secondary | ICD-10-CM

## 2012-12-15 NOTE — Telephone Encounter (Signed)
UGI w/ SBFT sch'd 12/20/12 @ 930 (815), npo after midnight, patient aware

## 2012-12-15 NOTE — Telephone Encounter (Signed)
David [name] states that he is having nausea every time he eats,he is able to get 3 -4 bites in then he experiences the nausea,and bloating. He says that he has been that way since the last surgery. He ask that Dr.Rehman be made aware. Forwarded to Dr.Rehman and Dr.Rehman also paged Jet request a call back [phone]. Per Dr.Rehman Let's do a Upper GI and a Small Bowel Follow through  Diagnosis: Crohns Disease , Nausea, Bloating

## 2012-12-19 ENCOUNTER — Other Ambulatory Visit (INDEPENDENT_AMBULATORY_CARE_PROVIDER_SITE_OTHER): Payer: Self-pay | Admitting: *Deleted

## 2012-12-19 NOTE — Telephone Encounter (Signed)
David [name] left a message requesting a refill for his Phenergan. States that he ran out over the weekend. Phenergan 25 mg  Take 1 by mouth for nausea twice daily as needed  #30 no refills. This was called to [name] was called and made aware

## 2012-12-20 ENCOUNTER — Other Ambulatory Visit (INDEPENDENT_AMBULATORY_CARE_PROVIDER_SITE_OTHER): Payer: Self-pay | Admitting: Internal Medicine

## 2012-12-20 ENCOUNTER — Ambulatory Visit (HOSPITAL_COMMUNITY)
Admission: RE | Admit: 2012-12-20 | Discharge: 2012-12-20 | Disposition: A | Discharge status: Home / Self Care | Payer: Medicare Other | Source: Ambulatory Visit | Attending: Internal Medicine | Admitting: Internal Medicine

## 2012-12-20 DIAGNOSIS — K5090 Crohn's disease, unspecified, without complications: Secondary | ICD-10-CM

## 2012-12-20 DIAGNOSIS — R140 Abdominal distension (gaseous): Secondary | ICD-10-CM

## 2012-12-20 DIAGNOSIS — R933 Abnormal findings on diagnostic imaging of other parts of digestive tract: Secondary | ICD-10-CM | POA: Insufficient documentation

## 2012-12-20 DIAGNOSIS — R110 Nausea: Secondary | ICD-10-CM | POA: Insufficient documentation

## 2012-12-22 ENCOUNTER — Telehealth (INDEPENDENT_AMBULATORY_CARE_PROVIDER_SITE_OTHER): Payer: Self-pay | Admitting: *Deleted

## 2012-12-22 DIAGNOSIS — K5090 Crohn's disease, unspecified, without complications: Secondary | ICD-10-CM

## 2012-12-22 DIAGNOSIS — D649 Anemia, unspecified: Secondary | ICD-10-CM

## 2012-12-22 NOTE — Telephone Encounter (Signed)
Labs per Dr.Rehman. These have been entered again in that the previous orders did not have bar code for lab/epic.

## 2012-12-26 ENCOUNTER — Encounter (HOSPITAL_COMMUNITY): Payer: Self-pay | Admitting: Pharmacy Technician

## 2012-12-26 ENCOUNTER — Other Ambulatory Visit (INDEPENDENT_AMBULATORY_CARE_PROVIDER_SITE_OTHER): Payer: Self-pay | Admitting: *Deleted

## 2012-12-26 ENCOUNTER — Ambulatory Visit (INDEPENDENT_AMBULATORY_CARE_PROVIDER_SITE_OTHER): Payer: Medicare Other | Admitting: Internal Medicine

## 2012-12-26 ENCOUNTER — Encounter (INDEPENDENT_AMBULATORY_CARE_PROVIDER_SITE_OTHER): Payer: Self-pay | Admitting: Internal Medicine

## 2012-12-26 VITALS — BP 110/70 | HR 78 | Temp 99.2°F | Resp 20 | Ht 71.0 in | Wt 217.2 lb

## 2012-12-26 DIAGNOSIS — R109 Unspecified abdominal pain: Secondary | ICD-10-CM

## 2012-12-26 DIAGNOSIS — K7460 Unspecified cirrhosis of liver: Secondary | ICD-10-CM

## 2012-12-26 DIAGNOSIS — K5090 Crohn's disease, unspecified, without complications: Secondary | ICD-10-CM

## 2012-12-26 DIAGNOSIS — K742 Hepatic fibrosis with hepatic sclerosis: Secondary | ICD-10-CM

## 2012-12-26 DIAGNOSIS — D649 Anemia, unspecified: Secondary | ICD-10-CM

## 2012-12-26 DIAGNOSIS — R933 Abnormal findings on diagnostic imaging of other parts of digestive tract: Secondary | ICD-10-CM

## 2012-12-26 MED ORDER — OXYCODONE HCL 10 MG PO TABS: 10.0000 mg | ORAL_TABLET | Freq: Three times a day (TID) | ORAL | Status: DC | PRN

## 2012-12-26 MED ORDER — DICYCLOMINE HCL 10 MG PO CAPS: 10.0000 mg | ORAL_CAPSULE | Freq: Three times a day (TID) | ORAL | Status: DC | PRN

## 2012-12-26 MED ORDER — OXYMORPHONE HCL ER 10 MG PO T12A: 10.0000 mg | EXTENDED_RELEASE_TABLET | Freq: Two times a day (BID) | ORAL | Status: DC

## 2012-12-26 NOTE — Patient Instructions (Signed)
EGD to be scheduled. Physician will contact you with results of blood work.  

## 2012-12-26 NOTE — Progress Notes (Signed)
Presenting complaint;  Upper abdominal pain and nausea. Followup for Crohn's disease.  Subjective:  David [name]  37 year old Caucasian male who is here for scheduled visit. He is accompanied by his mother today. He was last seen 4 weeks ago. He has lost 3 pounds. He was seen by Dr. Dorris Fetch and discharged since he has done well since pericardial window was created. Today he is accompanied by his mother and requests that I write his pain medication prescription which is being supervised by his mother. Following his last visit he had upper GI with small bowel follow-through which is reviewed under lab data. Last month he was begun on budesonide and he has noted some decrease in right lower quadrant pain. However he continues to complain of pain across upper abdomen which is worse after each meal and associated with nausea but he hasn't vomited. He is having 3-4 bowel movements per day consistency varies from loose to soft. He denies melena or rectal bleeding. He is having low-grade fever every day. Over the weekend he had a temp of 103 but did not go to ER or call me. He denies shortness of breath or chest pain.  He says leg edema has resolved but he still has edema involving his thighs and flanks. He would like to go back on dicyclomine which helps with lower abdominal cramps. He has an appointment to see Dr. Maisie Fus wall later this week. He was not able to have his labs prior to this visit but will have them done today when he leaves office.  Current Medications: Current Outpatient Prescriptions  Medication Sig Dispense Refill  . albuterol (PROVENTIL HFA;VENTOLIN HFA) 108 (90 BASE) MCG/ACT inhaler Inhale 2 puffs into the lungs every 6 (six) hours as needed. breathing      . aspirin 81 MG EC tablet Take 1 tablet (81 mg total) by mouth daily.  30 tablet    . budesonide (ENTOCORT EC) 3 MG 24 hr capsule Take 3 mg by mouth. TAKE 3 CAPSULES DAILY FOR ONE WEEK, TAKE 2 CAPSULES DAILY FOR ONE WEEK, TAKE  ONE CAPSULE DAILY FOR ONE WEEK      . colchicine 0.6 MG tablet Take 1 tablet (0.6 mg total) by mouth 2 (two) times daily.  60 tablet  1  . ferrous sulfate 325 (65 FE) MG tablet Take 1 tablet (325 mg total) by mouth 3 (three) times daily with meals.  90 tablet  1  . fish oil-omega-3 fatty acids 1000 MG capsule Take 1 g by mouth 3 (three) times daily.      . furosemide (LASIX) 40 MG tablet Take 80 mg by mouth 2 (two) times daily.       . Multiple Vitamins-Minerals (MULTIVITAMINS THER. W/MINERALS) TABS Take 1 tablet by mouth daily.        . Oxycodone HCl 20 MG TABS Take 10 mg by mouth 3 (three) times daily. pain      . oxymorphone (OPANA ER, CRUSH RESISTANT,) 10 MG T12A 12 hr tablet Take 10 mg by mouth every 12 (twelve) hours.      . pantoprazole (PROTONIX) 40 MG tablet Take 1 tablet (40 mg total) by mouth daily.  30 tablet  4  . potassium chloride (K-DUR) 10 MEQ tablet Take 20 mEq by mouth daily.       . Probiotic Product (ALIGN) 4 MG CAPS Take 4 mg by mouth daily.       . promethazine (PHENERGAN) 25 MG tablet Take 25 mg by mouth every 6 (six)  hours as needed. Nausea/vomiting      . spironolactone (ALDACTONE) 50 MG tablet Take 50 mg by mouth daily.       . ursodiol (ACTIGALL) 300 MG capsule Take 2 capsules (600 mg total) by mouth 2 (two) times daily.  120 capsule  1     Objective: Blood pressure 110/70, pulse 78, temperature 99.2 F (37.3 C), temperature source Oral, resp. rate 20, height 5\' 11"  (1.803 m), weight 217 lb 3.2 oz (98.521 kg). Patient is alert and in no acute distress. He appears chronically ill. Conjunctiva is pink. Sclera is nonicteric Oropharyngeal mucosa is normal. No neck masses or thyromegaly noted. Cardiac exam with regular rhythm normal S1 and S2. No murmur or gallop noted. Lungs are clear to auscultation. Abdomen is protuberant with broad midline scar. He has pitting edema involving both flanks. Abdomen is soft with mild tenderness at epigastrium and right lower  quadrant the He also has edema involving his thighs but none at legs.  Labs/studies Results: Upper GI with small bowel follow-through from 12/20/2012 revealed very prominent gastric folds and large amount of fluid in the stomach and no evidence of pyloric stenosis or peptic ulcer. He was thickening to neoterminal ileum and cecum. No evidence of dilated small bowel.   Assessment:  #1. Crohn's disease. He has active disease in ileocecal region and currently on budesonide he will stay on 6 mg a day until he could go back on oral mesalamine or another agent. #2. Recurrent epigastric pain with nausea. He has very prominent gastric folds. He needs to be further evaluated with EGD. #3. Anemia. Multifactorial but he also has history of iron deficiency. H&H 3 weeks ago was 9.3 and 29.9 and he is due for one now. #4. Hepatic fibrosis with steatosis. #5. History of pericarditis and pericardial effusion. Status post pericardial window on 11/18/2012. Cause of pericarditis not yet determined. Final TB culture reports pending. #6. 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 I will start riding his prescription but he understands that he must follow the recommendations and his mother will supervise his pain medications.   Plan:  New prescription given for opana 10 mg by mouth twice a day. New prescription given for oxycodone 10 mg by mouth 3 times a day. Dicyclomine 10 mg 3 times a day when necessary. Continue budesonide 6 mg by mouth every morning. Diagnostic EGD to be scheduled. Will confer with Dr. Valera Castle before oral mesalamine therapy initiated. Patient will go to the lab for CBC, comprehensive chemistry panel and CRP.

## 2012-12-27 LAB — CBC WITH DIFFERENTIAL/PLATELET
Eosinophils Absolute: 0.1 10*3/uL (ref 0.0–0.7)
Eosinophils Relative: 1 % (ref 0–5)
Lymphs Abs: 1.1 10*3/uL (ref 0.7–4.0)
MCH: 23 pg — ABNORMAL LOW (ref 26.0–34.0)
MCV: 73.7 fL — ABNORMAL LOW (ref 78.0–100.0)
Monocytes Relative: 9 % (ref 3–12)
Platelets: 214 10*3/uL (ref 150–400)
RBC: 3.91 MIL/uL — ABNORMAL LOW (ref 4.22–5.81)

## 2012-12-27 LAB — COMPREHENSIVE METABOLIC PANEL
BUN: 10 mg/dL (ref 6–23)
CO2: 26 mEq/L (ref 19–32)
Calcium: 8.2 mg/dL — ABNORMAL LOW (ref 8.4–10.5)
Chloride: 105 mEq/L (ref 96–112)
Creat: 0.61 mg/dL (ref 0.50–1.35)

## 2012-12-28 ENCOUNTER — Encounter: Payer: Self-pay | Admitting: Cardiology

## 2012-12-28 ENCOUNTER — Ambulatory Visit (INDEPENDENT_AMBULATORY_CARE_PROVIDER_SITE_OTHER): Payer: Medicare Other | Admitting: Cardiology

## 2012-12-28 VITALS — BP 100/65 | HR 98 | Ht 71.0 in | Wt 227.0 lb

## 2012-12-28 DIAGNOSIS — R600 Localized edema: Secondary | ICD-10-CM

## 2012-12-28 DIAGNOSIS — I313 Pericardial effusion (noninflammatory): Secondary | ICD-10-CM

## 2012-12-28 DIAGNOSIS — R609 Edema, unspecified: Secondary | ICD-10-CM

## 2012-12-28 DIAGNOSIS — I319 Disease of pericardium, unspecified: Secondary | ICD-10-CM

## 2012-12-28 DIAGNOSIS — J90 Pleural effusion, not elsewhere classified: Secondary | ICD-10-CM

## 2012-12-28 NOTE — Patient Instructions (Addendum)
Your physician recommends that you schedule a follow-up appointment in:   Your physician has requested that you have an echocardiogram. Echocardiography is a painless test that uses sound waves to create images of your heart. It provides your doctor with information about the size and shape of your heart and how well your heart's chambers and valves are working. This procedure takes approximately one hour. There are no restrictions for this procedure.  Your physician has recommended you make the following change in your medication:  1 - RESTART Lasix 40 mg twice a day  Your physician recommends that you weigh, daily, at the same time every day, and in the same amount of clothing. Please record your daily weights on the handout provided and bring it to your next appointment.

## 2012-12-28 NOTE — Progress Notes (Signed)
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 He underwent a VATS on 11/17/2012 by Dr. Dorris Fetch. Cytology, cultures, both fluid and tissue were nonrevealing. He's been treated with colcichine since surgery. He denies any chest pain or any symptoms similar to what he had prior surgery.  He decreased his Lasix to 40 mg a day about 10 days ago. His weight has increased 10 pounds since seeing Dr. Karilyn Cota 2 days ago. He has noted scrotal swelling as well as abdominal distention. He's been having a lot of abdominal cramps per Dr. Karilyn Cota. Recent upper GI showed prominent folds in there is concerned that his Crohn's disease is very active again. He is scheduled for endoscopy and biopsies next week. He has had an abdominal ultrasound in the past but very little ascitic fluid was seen.  Chest x-ray on 12/31 showed small bilateral pleural effusions with a small lower heart size. Echocardiogram has not been repeated.  Past Medical History  Diagnosis Date  . 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 Hepatomegaly 05/04/2011  . Fatty liver 05/04/2011    "stage III fatty liver fibrosis" (11/15/2012)  . GERD (gastroesophageal reflux disease)   . Chronic liver disease     /notes 11/15/2012  . Pericarditis     [name] 11/15/2012  . Hypertension   . Pneumonia 1977  . Shortness of breath     "all the time right now" (11/15/2012)  . History of blood transfusion 2004  . History of stomach ulcers   . Duodenal ulcer   . Depression   . Kidney stones     bilaterally/notes 11/15/2012  . Hepatic fibrosis     [name] 11/15/2012  . Anemia   . Pericardial effusion 10/29/2012    moderate to large/notes 11/15/2012  . Anxiety   . Hepatitis   . Crohn's disease     Current Outpatient Prescriptions  Medication Sig Dispense Refill  . albuterol  (PROVENTIL HFA;VENTOLIN HFA) 108 (90 BASE) MCG/ACT inhaler Inhale 2 puffs into the lungs every 6 (six) hours as needed. breathing      . aspirin 81 MG EC tablet Take 1 tablet (81 mg total) by mouth daily.  30 tablet    . budesonide (ENTOCORT EC) 3 MG 24 hr capsule Take 6 mg by mouth daily.       . colchicine 0.6 MG tablet Take 1 tablet (0.6 mg total) by mouth 2 (two) times daily.  60 tablet  1  . dicyclomine (BENTYL) 10 MG capsule Take 10 mg by mouth 3 (three) times daily.      . ferrous sulfate 325 (65 FE) MG tablet Take 325 mg by mouth 2 (two) times daily.      . fish oil-omega-3 fatty acids 1000 MG capsule Take 1 g by mouth 3 (three) times daily.      . furosemide (LASIX) 40 MG tablet Take 40 mg by mouth 2 (two) times daily.       . Multiple Vitamins-Minerals (MULTIVITAMINS THER. W/MINERALS) TABS Take 1 tablet by mouth 2 (two) times daily.       . Oxycodone HCl 10 MG TABS Take 10 mg by mouth 3 (three) times daily.      Marland Kitchen oxymorphone (OPANA ER, CRUSH RESISTANT,) 10 MG T12A 12 hr tablet Take 1 tablet (10 mg total) by mouth every 12 (twelve) hours.  60  tablet  0  . pantoprazole (PROTONIX) 40 MG tablet Take 1 tablet (40 mg total) by mouth daily.  30 tablet  4  . potassium chloride (K-DUR) 10 MEQ tablet Take 20 mEq by mouth daily.       . Probiotic Product (ALIGN) 4 MG CAPS Take 4 mg by mouth daily.       . promethazine (PHENERGAN) 25 MG tablet Take 25 mg by mouth every 6 (six) hours as needed. Nausea/vomiting      . spironolactone (ALDACTONE) 50 MG tablet Take 50 mg by mouth daily.       . ursodiol (ACTIGALL) 300 MG capsule Take 2 capsules (600 mg total) by mouth 2 (two) times daily.  120 capsule  1    Allergies  Allergen Reactions  . Morphine And Related Hives    Family History  Problem Relation Age of Onset  . Diabetes Mother   . Diabetes Brother   . Healthy Daughter   . Healthy Son     History   Social History  . Marital Status: Married    Spouse Name: N/A    Number of  Children: 2  . Years of Education: N/A   Occupational History  . disabled/retired    Social History Main Topics  . Smoking status: Former Smoker -- 0.5 packs/day for 24 years    Types: Cigarettes    Start date: 10/28/2012  . Smokeless tobacco: Current User    Types: Snuff     Comment: 11/15/2012 "quit smoking cigarettes ~ 1 month ago"  . Alcohol Use: No  . Drug Use: No  . Sexually Active: Not Currently   Other Topics Concern  . Not on file   Social History Narrative  . No narrative on file    ROS ALL NEGATIVE EXCEPT THOSE NOTED IN HPI  PE  General Appearance: well developed, well nourished in no acute distress HEENT: symmetrical face, PERRLA, good dentition  Neck: no JVD, thyromegaly, or adenopathy, trachea midline Chest: symmetric without deformity Cardiac: PMI non-displaced, RRR, normal S1, S2, no gallop or murmur, no rub Lung: clear to ausculation and percussion Vascular: all pulses full without bruits  Abdominal: Distended, nontender, good bowel sounds, no HSM, no bruits, minimal ascitic fluid by physical examination Extremities: no cyanosis, clubbing , 2+ pitting edema in the thighs predominantly, no sign of DVT, no varicosities  Skin: normal color, no rashes Neuro: alert and oriented x 3, non-focal Pysch: normal affect  EKG  BMET    Component Value Date/Time   NA 140 12/26/2012 1345   K 3.9 12/26/2012 1345   CL 105 12/26/2012 1345   CO2 26 12/26/2012 1345   GLUCOSE 91 12/26/2012 1345   BUN 10 12/26/2012 1345   CREATININE 0.61 12/26/2012 1345   CREATININE 0.66 11/22/2012 0606   CALCIUM 8.2* 12/26/2012 1345   GFRNONAA >90 11/22/2012 0606   GFRAA >90 11/22/2012 0606    Lipid Panel  No results found for this basename: chol, trig, hdl, cholhdl, vldl, ldlcalc    CBC    Component Value Date/Time   WBC 7.5 12/26/2012 1345   RBC 3.91* 12/26/2012 1345   HGB 9.0* 12/26/2012 1345   HCT 28.8* 12/26/2012 1345   PLT 214 12/26/2012 1345   MCV 73.7* 12/26/2012 1345   MCH  23.0* 12/26/2012 1345   MCHC 31.3 12/26/2012 1345   RDW 16.9* 12/26/2012 1345   LYMPHSABS 1.1 12/26/2012 1345   MONOABS 0.7 12/26/2012 1345   EOSABS 0.1 12/26/2012 1345  BASOSABS 0.0 12/26/2012 1345

## 2012-12-28 NOTE — Assessment & Plan Note (Signed)
This was nonspecific inflammation on biopsy but the pericardium was very beefy red per Dr. Dorris Fetch. Discussed with Dr.Rehman and will continue colchicine.We'll repeat echocardiogram this week to rule out any recurrent pericardial effusion. This swelling could be explained by him stopping his p.m. dose of Lasix a week or so ago. We'll increase his Lasix to 40 mg by mouth twice a day and I've asked to take a dose today. He'll remain on potassium and spironolactone. Recent labs were stable. He will keep a daily weight chart to let us know if his weight continues to climb. We may need to increase his Lasix further. He will follow with GI next week.

## 2012-12-28 NOTE — Assessment & Plan Note (Signed)
Improved by last chest x-ray. Physical exam reveals perhaps small bilateral effusions. Increase Lasix as noted under pericardial disease.

## 2012-12-30 ENCOUNTER — Ambulatory Visit (HOSPITAL_COMMUNITY)
Admission: RE | Admit: 2012-12-30 | Discharge: 2012-12-30 | Disposition: A | Discharge status: Home / Self Care | Payer: Medicare Other | Source: Ambulatory Visit | Attending: Cardiology | Admitting: Cardiology

## 2012-12-30 DIAGNOSIS — I319 Disease of pericardium, unspecified: Secondary | ICD-10-CM

## 2012-12-30 DIAGNOSIS — I313 Pericardial effusion (noninflammatory): Secondary | ICD-10-CM

## 2012-12-30 DIAGNOSIS — I318 Other specified diseases of pericardium: Secondary | ICD-10-CM | POA: Insufficient documentation

## 2012-12-30 NOTE — Progress Notes (Signed)
*  PRELIMINARY RESULTS* Echocardiogram 2D Echocardiogram has been performed.  David [name] 12/30/2012, 9:41 AM

## 2013-01-06 ENCOUNTER — Encounter (HOSPITAL_COMMUNITY): Payer: Self-pay | Admitting: *Deleted

## 2013-01-06 ENCOUNTER — Encounter (HOSPITAL_COMMUNITY)
Admission: RE | Disposition: A | Discharge status: Home / Self Care | Payer: Self-pay | Source: Ambulatory Visit | Attending: Internal Medicine

## 2013-01-06 ENCOUNTER — Ambulatory Visit (HOSPITAL_COMMUNITY)
Admission: RE | Admit: 2013-01-06 | Discharge: 2013-01-06 | Disposition: A | Discharge status: Home / Self Care | Payer: Medicare Other | Source: Ambulatory Visit | Attending: Internal Medicine | Admitting: Internal Medicine

## 2013-01-06 DIAGNOSIS — R110 Nausea: Secondary | ICD-10-CM

## 2013-01-06 DIAGNOSIS — K2100 Gastro-esophageal reflux disease with esophagitis, without bleeding: Secondary | ICD-10-CM | POA: Insufficient documentation

## 2013-01-06 DIAGNOSIS — R109 Unspecified abdominal pain: Secondary | ICD-10-CM

## 2013-01-06 DIAGNOSIS — R1013 Epigastric pain: Secondary | ICD-10-CM | POA: Insufficient documentation

## 2013-01-06 DIAGNOSIS — K319 Disease of stomach and duodenum, unspecified: Secondary | ICD-10-CM

## 2013-01-06 DIAGNOSIS — R933 Abnormal findings on diagnostic imaging of other parts of digestive tract: Secondary | ICD-10-CM

## 2013-01-06 DIAGNOSIS — D649 Anemia, unspecified: Secondary | ICD-10-CM

## 2013-01-06 DIAGNOSIS — K219 Gastro-esophageal reflux disease without esophagitis: Secondary | ICD-10-CM

## 2013-01-06 DIAGNOSIS — K5090 Crohn's disease, unspecified, without complications: Secondary | ICD-10-CM

## 2013-01-06 DIAGNOSIS — K766 Portal hypertension: Secondary | ICD-10-CM

## 2013-01-06 HISTORY — PX: ESOPHAGOGASTRODUODENOSCOPY: SHX5428

## 2013-01-06 SURGERY — EGD (ESOPHAGOGASTRODUODENOSCOPY)
Anesthesia: Moderate Sedation

## 2013-01-06 MED ORDER — SODIUM CHLORIDE 0.9 % IJ SOLN
INTRAMUSCULAR | Status: AC
  Filled 2013-01-06: qty 10

## 2013-01-06 MED ORDER — STERILE WATER FOR IRRIGATION IR SOLN
Status: DC | PRN
  Administered 2013-01-06: 12:00:00

## 2013-01-06 MED ORDER — MIDAZOLAM HCL 5 MG/5ML IJ SOLN
INTRAMUSCULAR | Status: AC
  Filled 2013-01-06: qty 10

## 2013-01-06 MED ORDER — SODIUM CHLORIDE 0.45 % IV SOLN
INTRAVENOUS | Status: DC
  Administered 2013-01-06: 1000 mL via INTRAVENOUS

## 2013-01-06 MED ORDER — SUCRALFATE 1 G PO TABS: 1.0000 g | ORAL_TABLET | Freq: Four times a day (QID) | ORAL | Status: DC

## 2013-01-06 MED ORDER — BUTAMBEN-TETRACAINE-BENZOCAINE 2-2-14 % EX AERO
INHALATION_SPRAY | CUTANEOUS | Status: DC | PRN
  Administered 2013-01-06: 2 via TOPICAL

## 2013-01-06 MED ORDER — MEPERIDINE HCL 50 MG/ML IJ SOLN
INTRAMUSCULAR | Status: AC
  Filled 2013-01-06: qty 1

## 2013-01-06 MED ORDER — MEPERIDINE HCL 25 MG/ML IJ SOLN
INTRAMUSCULAR | Status: DC | PRN
  Administered 2013-01-06 (×2): 25 mg via INTRAVENOUS

## 2013-01-06 MED ORDER — PROMETHAZINE HCL 25 MG/ML IJ SOLN
INTRAMUSCULAR | Status: DC | PRN
  Administered 2013-01-06 (×2): 12.5 mg via INTRAVENOUS

## 2013-01-06 MED ORDER — MIDAZOLAM HCL 5 MG/5ML IJ SOLN
INTRAMUSCULAR | Status: AC
  Filled 2013-01-06: qty 5

## 2013-01-06 MED ORDER — MIDAZOLAM HCL 5 MG/5ML IJ SOLN
INTRAMUSCULAR | Status: DC | PRN
  Administered 2013-01-06: 3 mg via INTRAVENOUS
  Administered 2013-01-06: 2 mg via INTRAVENOUS
  Administered 2013-01-06: 3 mg via INTRAVENOUS
  Administered 2013-01-06: 2 mg via INTRAVENOUS
  Administered 2013-01-06: 1 mg via INTRAVENOUS
  Administered 2013-01-06: 3 mg via INTRAVENOUS

## 2013-01-06 MED ORDER — PROMETHAZINE HCL 25 MG/ML IJ SOLN
INTRAMUSCULAR | Status: AC
  Filled 2013-01-06: qty 1

## 2013-01-06 NOTE — Op Note (Addendum)
EGD PROCEDURE REPORT  PATIENT:  David KSIAZEK  MR#:  [id number] Birthdate:  1976-04-14, 37 y.o., male Endoscopist:  Dr. Malissa Hippo, MD Procedure Date: 01/06/2013  Procedure:   EGD  Indications:  Patient is 37 year old Caucasian male with multiple medical problems including Crohn's disease and chronic GERD who presents with recurrent postprandial epigastric pain and nausea. Recent upper GI series revealed prominent gastric folds in fluid in the stomach. He is undergoing diagnostic EGD.            Informed Consent:  The risks, benefits, alternatives & imponderables which include, but are not limited to, bleeding, infection, perforation, drug reaction and potential missed lesion have been reviewed.  The potential for biopsy, lesion removal, esophageal dilation, etc. have also been discussed.  Questions have been answered.  All parties agreeable.  Please see history & physical in medical record for more information.  Medications:  Demerol 50 mg IV Versed 14 mg IV Promethazine 25 mg IV in diluted form. Cetacaine spray topically for oropharyngeal anesthesia  Description of procedure:  The endoscope was introduced through the mouth and advanced to the second portion of the duodenum without difficulty or limitations. The mucosal surfaces were surveyed very carefully during advancement of the scope and upon withdrawal.  Findings:  Esophagus:  Mucosa of  proximal and middle third was normal. There were multiple linear erosions involving distal 4-5 cm. There was slight yellowish discoloration to mucosa suggesting bile reflux. GEJ:  42 cm Stomach:  Large amount of bile and some food debris noted in the stomach which was quite large. Prominent gastric folds with mucosal edema but no mucosal friability or ulcers noted. Wide-open gastrojejunostomy with free reflux of bile into the stomach. Both afferent and even loops are examined and unremarkable. Pyloric channel was patent. Angularis fundus and  cardia were examined by retroflexing the scope and were unremarkable. Duodenum:  Bulbar and post bulbar mucosa was also examined and was unremarkable.  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 Large amount of bile and some food debris in the stomach with patent gastrojejunostomy and patent pylorus. Portal gastropathy without evidence of varices. Normal examination of bulb, second part of the duodenum while pyloric channel and normal afferent and efferent loops. Suspect symptoms may be due to bile reflux.  Recommendations:  Patient advised to keep dicyclomine use to a minimum. Sucralfate 1 g by mouth a.c. and each bedtime. Patient will continue scheduled pain medication and use when necessary medication for breakthrough only when absolutely needed. Office visit as planned.   Taro Hidrogo U  01/06/2013  12:45 PM  CC: Dr. [name], MD & Dr. Bonnetta Barry ref. provider found

## 2013-01-06 NOTE — H&P (Signed)
David [name] is an 37 y.o. male.   Chief Complaint: Patient is here for EGD. HPI: Patient is 37 year old Caucasian male with multiple medical history including Crohn's disease recently developed pericarditis and underwent pericardial window has persistent epigastric pain and postprandial nausea. He had upper GI with small bowel follow-through which showed markedly swollen gastric folds in a lot of fluid in the stomach. The study also showed  ileitis proximal to ileocolonic anastomosis. He denies vomiting melena or rectal bleeding. He does have history of peptic ulcer disease. He is undergoing diagnostic EGD.  Past Medical History  Diagnosis Date  . 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 Hepatomegaly 05/04/2011  . Fatty liver 05/04/2011    "stage III fatty liver fibrosis" (11/15/2012)  . GERD (gastroesophageal reflux disease)   . Chronic liver disease     /notes 11/15/2012  . Pericarditis     [name] 11/15/2012  . Hypertension   . Pneumonia 1977  . Shortness of breath     "all the time right now" (11/15/2012)  . History of blood transfusion 2004  . History of stomach ulcers   . Duodenal ulcer   . Depression   . Kidney stones     bilaterally/notes 11/15/2012  . Hepatic fibrosis     [name] 11/15/2012  . Anemia   . Pericardial effusion 10/29/2012    moderate to large/notes 11/15/2012  . Anxiety   . Hepatitis   . Crohn's disease     Past Surgical History  Procedure Date  . Anal examination under anesthesia 02/11/2011    [name]  . Treatment fistula anal 07/03/11    This was a second surgery to repair Anal fistula  . Small intestine surgery 2004    "had hole cut in small intestines during endoscopy; had to have OR & leave me open 3 months" (11/15/2012)  . Cholecystectomy ~ 2003  . Appendectomy ~ 2003  . Video assisted thoracoscopy 11/17/2012    Procedure: VIDEO ASSISTED THORACOSCOPY;  Surgeon:  Loreli Slot, MD;  Location: [facility] OR;  Service: Thoracic;  Laterality: Left;  drainage of left pleural effusion  . Pericardial window 11/17/2012    Procedure: PERICARDIAL WINDOW;  Surgeon: Loreli Slot, MD;  Location: [facility] OR;  Service: Thoracic;  Laterality: N/A;    Family History  Problem Relation Age of Onset  . Diabetes Mother   . Diabetes Brother   . Healthy Daughter   . Healthy Son    Social History:  reports that he quit smoking about 6 weeks ago. His smoking use included Cigarettes. He started smoking about 2 months ago. He has a 12 pack-year smoking history. His smokeless tobacco use includes Snuff. He reports that he does not drink alcohol or use illicit drugs.  Allergies:  Allergies  Allergen Reactions  . Morphine And Related Hives    Medications Prior to Admission  Medication Sig Dispense Refill  . albuterol (PROVENTIL HFA;VENTOLIN HFA) 108 (90 BASE) MCG/ACT inhaler Inhale 2 puffs into the lungs every 6 (six) hours as needed. breathing      . aspirin 81 MG EC tablet Take 1 tablet (81 mg total) by mouth daily.  30 tablet    . budesonide (ENTOCORT EC) 3 MG 24 hr capsule Take 6 mg by mouth daily.       . colchicine 0.6 MG tablet Take 1 tablet (0.6 mg total) by mouth 2 (two) times daily.  60  tablet  1  . dicyclomine (BENTYL) 10 MG capsule Take 10 mg by mouth 3 (three) times daily.      . ferrous sulfate 325 (65 FE) MG tablet Take 325 mg by mouth 2 (two) times daily.      . fish oil-omega-3 fatty acids 1000 MG capsule Take 1 g by mouth 3 (three) times daily.      . furosemide (LASIX) 40 MG tablet Take 40 mg by mouth 2 (two) times daily.       . Multiple Vitamins-Minerals (MULTIVITAMINS THER. W/MINERALS) TABS Take 1 tablet by mouth 2 (two) times daily.       . Oxycodone HCl 10 MG TABS Take 10 mg by mouth 3 (three) times daily.      Marland Kitchen oxymorphone (OPANA ER, CRUSH RESISTANT,) 10 MG T12A 12 hr tablet Take 1 tablet (10 mg total) by mouth every 12 (twelve) hours.  60  tablet  0  . pantoprazole (PROTONIX) 40 MG tablet Take 1 tablet (40 mg total) by mouth daily.  30 tablet  4  . potassium chloride (K-DUR) 10 MEQ tablet Take 20 mEq by mouth daily.       . Probiotic Product (ALIGN) 4 MG CAPS Take 4 mg by mouth daily.       . promethazine (PHENERGAN) 25 MG tablet Take 25 mg by mouth every 6 (six) hours as needed. Nausea/vomiting      . spironolactone (ALDACTONE) 50 MG tablet Take 50 mg by mouth daily.       . ursodiol (ACTIGALL) 300 MG capsule Take 2 capsules (600 mg total) by mouth 2 (two) times daily.  120 capsule  1    No results found for this or any previous visit (from the past 48 hour(s)). No results found.  ROS  Blood pressure 114/73, pulse 90, temperature 98.6 F (37 C), temperature source Oral, resp. rate 22, SpO2 95.00%. Physical Exam  Constitutional:       Well-developed Caucasian male with pain extremities and full abdomen  HENT:  Mouth/Throat: Oropharynx is clear and moist.  Eyes: Conjunctivae normal are normal. No scleral icterus.  Neck: No thyromegaly present.  Cardiovascular: Normal rate and regular rhythm.   No murmur heard. Respiratory: Effort normal and breath sounds normal.  GI: He exhibits no mass.       Full abdomen but well healed midline scar. Mild midepigastric tenderness in firm liver edge on palpation  Musculoskeletal: He exhibits edema (trace edema around ankles).  Lymphadenopathy:    He has no cervical adenopathy.  Neurological: He is alert.  Skin: Skin is warm and dry.     Assessment/Plan Recurrent epigastric pain and nausea. Abnormal upper GI series with markedly swollen gastric folds. Diagnostic EGD.  Marzelle Rutten U 01/06/2013, 12:04 PM

## 2013-01-09 ENCOUNTER — Encounter (HOSPITAL_COMMUNITY): Payer: Self-pay | Admitting: Internal Medicine

## 2013-01-24 ENCOUNTER — Other Ambulatory Visit (INDEPENDENT_AMBULATORY_CARE_PROVIDER_SITE_OTHER): Payer: Self-pay | Admitting: Internal Medicine

## 2013-01-24 ENCOUNTER — Telehealth (INDEPENDENT_AMBULATORY_CARE_PROVIDER_SITE_OTHER): Payer: Self-pay | Admitting: *Deleted

## 2013-01-24 DIAGNOSIS — R109 Unspecified abdominal pain: Secondary | ICD-10-CM

## 2013-01-24 MED ORDER — OXYCODONE HCL 10 MG PO TABS: 10.0000 mg | ORAL_TABLET | Freq: Three times a day (TID) | ORAL | Status: DC

## 2013-01-24 MED ORDER — OXYMORPHONE HCL ER 10 MG PO T12A: 10.0000 mg | EXTENDED_RELEASE_TABLET | Freq: Two times a day (BID) | ORAL | Status: DC
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
## 2013-01-28 ENCOUNTER — Other Ambulatory Visit: Payer: Self-pay

## 2013-02-21 ENCOUNTER — Other Ambulatory Visit (INDEPENDENT_AMBULATORY_CARE_PROVIDER_SITE_OTHER): Payer: Self-pay | Admitting: Internal Medicine

## 2013-02-21 DIAGNOSIS — R109 Unspecified abdominal pain: Secondary | ICD-10-CM

## 2013-02-21 MED ORDER — OXYCODONE HCL 10 MG PO TABS: 10.0000 mg | ORAL_TABLET | Freq: Three times a day (TID) | ORAL | Status: DC | PRN

## 2013-02-21 MED ORDER — OXYMORPHONE HCL ER 10 MG PO T12A: 10.0000 mg | EXTENDED_RELEASE_TABLET | Freq: Two times a day (BID) | ORAL | Status: DC

## 2013-02-27 ENCOUNTER — Encounter (INDEPENDENT_AMBULATORY_CARE_PROVIDER_SITE_OTHER): Payer: Self-pay | Admitting: Internal Medicine

## 2013-02-27 ENCOUNTER — Ambulatory Visit (INDEPENDENT_AMBULATORY_CARE_PROVIDER_SITE_OTHER): Payer: Medicare Other | Admitting: Internal Medicine

## 2013-02-27 VITALS — BP 130/72 | HR 78 | Temp 98.8°F | Resp 20 | Ht 71.0 in | Wt 226.3 lb

## 2013-02-27 DIAGNOSIS — Z79899 Other long term (current) drug therapy: Secondary | ICD-10-CM

## 2013-02-27 DIAGNOSIS — Z5181 Encounter for therapeutic drug level monitoring: Secondary | ICD-10-CM

## 2013-02-27 DIAGNOSIS — K5090 Crohn's disease, unspecified, without complications: Secondary | ICD-10-CM

## 2013-02-27 DIAGNOSIS — R112 Nausea with vomiting, unspecified: Secondary | ICD-10-CM

## 2013-02-27 DIAGNOSIS — D649 Anemia, unspecified: Secondary | ICD-10-CM

## 2013-02-27 MED ORDER — ONDANSETRON HCL 4 MG PO TABS: 4.0000 mg | ORAL_TABLET | Freq: Three times a day (TID) | ORAL | Status: DC | PRN

## 2013-02-27 NOTE — Patient Instructions (Signed)
Office will call as soon as infliximab infusion is approved

## 2013-02-27 NOTE — Progress Notes (Addendum)
Presenting complaint;  Abdominal pain, bloating, nausea and vomiting.  Subjective:  Patient is 37 year old Caucasian male who presents for scheduled visit. He was last seen 2 months ago. He has rather aggressive Crohn's disease. He also has pericarditis etiology of which could never be established. He required pericardial window in December 2013.  Her Remicade and mesalamine were discontinued when he was diagnosed with pericarditis. He was begun on but budesonide for relapse in December. He is currently on 6 mg per day. He feels his Crohn's disease is active and wants to get back on Remicade or infliximab. For the last few weeks he's been having postprandial bloating and cramping and intermittent nausea and vomiting. Last episode was 3 days ago. He has gained 9 pounds since his last visit. He states his weight gain is because he did not take fluid pill yesterday. He is having low-grade fever. He denies chest pain or shortness of breath. He is scheduled to see Dr. Valera Castle in two days. He is not eating small meals. He is having 1-2 soft to loose stools per day. He denies melena or rectal bleeding. His heartburn is well controlled with therapy.  Current Medications: Current Outpatient Prescriptions  Medication Sig Dispense Refill  . aspirin 81 MG EC tablet Take 1 tablet (81 mg total) by mouth daily.  30 tablet    . budesonide (ENTOCORT EC) 3 MG 24 hr capsule Take 6 mg by mouth daily.       . colchicine 0.6 MG tablet Take 1 tablet (0.6 mg total) by mouth 2 (two) times daily.  60 tablet  1  . dicyclomine (BENTYL) 10 MG capsule Take 10 mg by mouth 3 (three) times daily.      . ferrous sulfate 325 (65 FE) MG tablet Take 325 mg by mouth 2 (two) times daily.      . fish oil-omega-3 fatty acids 1000 MG capsule Take 1 g by mouth 3 (three) times daily.      . furosemide (LASIX) 40 MG tablet Take 40 mg by mouth 2 (two) times daily.       . Multiple Vitamins-Minerals (MULTIVITAMINS THER. W/MINERALS) TABS  Take 1 tablet by mouth 2 (two) times daily.       . Oxycodone HCl 10 MG TABS Take 1 tablet (10 mg total) by mouth 3 (three) times daily as needed.  90 tablet  0  . oxymorphone (OPANA ER, CRUSH RESISTANT,) 10 MG T12A 12 hr tablet Take 1 tablet (10 mg total) by mouth every 12 (twelve) hours.  60 tablet  0  . pantoprazole (PROTONIX) 40 MG tablet Take 1 tablet (40 mg total) by mouth daily.  30 tablet  4  . potassium chloride (K-DUR) 10 MEQ tablet Take 20 mEq by mouth daily.       . Probiotic Product (ALIGN) 4 MG CAPS Take 4 mg by mouth daily.       . promethazine (PHENERGAN) 25 MG tablet Take 25 mg by mouth every 6 (six) hours as needed. Nausea/vomiting      . spironolactone (ALDACTONE) 50 MG tablet Take 50 mg by mouth daily.       . sucralfate (CARAFATE) 1 G tablet Take 1 tablet (1 g total) by mouth 4 (four) times daily.  120 tablet  2  . ursodiol (ACTIGALL) 300 MG capsule Take 2 capsules (600 mg total) by mouth 2 (two) times daily.  120 capsule  1  . albuterol (PROVENTIL HFA;VENTOLIN HFA) 108 (90 BASE) MCG/ACT inhaler Inhale 2  puffs into the lungs every 6 (six) hours as needed. breathing       No current facility-administered medications for this visit.     Objective: Blood pressure 130/72, pulse 78, temperature 98.8 F (37.1 C), temperature source Oral, resp. rate 20, height 5\' 11"  (1.803 m), weight 226 lb 4.8 oz (102.649 kg). Patient is alert and in no acute distress. Conjunctiva is pink. Sclera is nonicteric Oropharyngeal mucosa is normal. No neck masses or thyromegaly noted. Cardiac exam with regular rhythm normal S1 and S2. He has faint systolic ejection murmur at LLSB. No problems noted. Lungs are clear to auscultation. Abdomen is protuberant. Bowel sounds are normal. On palpation mild to moderate tenderness noted at mid and right side of the abdomen.  He has 1+ pitting edema to his legs.   Assessment:  #1. Patient's symptoms are consistent with relapse of Crohn's disease and the  desonide and load dose is ineffective. We have very limited choices at this time other than putting him back on Remicade or infliximab. I would not favor oral mesalamine as it is not very effective in treating small bowel Crohn's disease. While on biologic therapy we will will have to monitor closely for relapse of pericarditis. He has an appointment to see Dr. Valera Castle. We will also get his input. #2. Anemia. #3. Chronic GERD. Symptoms well controlled with therapy. #4. History of pericarditis. Etiology unclear. Status post pericardial window in December 2013. He is currently on colchicine. #5. Stage III hepatic fibrosis. Multifactorial etiology. Not a clinical issue at this time. Will continue Urso.   Plan:  CBC, metabolic 7, serum albumin and CRP. Request Remicade or infliximab approval. He will need induction therapy which would be dose now at 2 weeks and 6 weeks followed by maintenance which be every 8 weeks. Once he is back on Remicade or infliximab will stop  Budesonide. Discontinue Promethazine and begin Ondansetron 4 mg 3 times a day when necessary. Office visit in 8 weeks.

## 2013-03-01 ENCOUNTER — Encounter: Payer: Self-pay | Admitting: Cardiology

## 2013-03-01 ENCOUNTER — Ambulatory Visit (INDEPENDENT_AMBULATORY_CARE_PROVIDER_SITE_OTHER): Payer: Medicare Other | Admitting: Adult Health

## 2013-03-01 VITALS — BP 126/88 | HR 91 | Ht 71.0 in | Wt 224.0 lb

## 2013-03-01 DIAGNOSIS — K5080 Crohn's disease of both small and large intestine without complications: Secondary | ICD-10-CM

## 2013-03-01 DIAGNOSIS — R600 Localized edema: Secondary | ICD-10-CM

## 2013-03-01 DIAGNOSIS — K50813 Crohn's disease of both small and large intestine with fistula: Secondary | ICD-10-CM

## 2013-03-01 DIAGNOSIS — I10 Essential (primary) hypertension: Secondary | ICD-10-CM

## 2013-03-01 DIAGNOSIS — I319 Disease of pericardium, unspecified: Secondary | ICD-10-CM

## 2013-03-01 DIAGNOSIS — R609 Edema, unspecified: Secondary | ICD-10-CM

## 2013-03-01 NOTE — Progress Notes (Deleted)
HPI:David [name] returns today for the evaluation and management of his history of a inflammatory pericarditis with a large pericardial effusion. He underwent a VATS on 11/17/2012 by Dr. Dorris Fetch. Cytology, cultures, both fluid and tissue were nonrevealing. He's been treated with colcichine since surgery. He denies any chest pain or any symptoms similar to what he had prior surgery. Recent upper GI showed prominent folds.He continues to have 5-8 loose stools daily.      He has had a EGD completed on 01/06/2013 demonstrating errosive reflux esophagitis, large amount of bile and some food debris in the stomach.with patent gastrojejunostomy and patent pylorus. Portal gastropathy without evidence of varices. Normal examination of bulb, second part of the duodenum while pyloric channel and normal afferent and efferent loops.  Suspect symptoms may be due to bile reflux.    Since being seen last, he has been seen by Dr. Karilyn Cota who would like to start him on Remacaid secondary to active Crohns symptoms.Marland Kitchen He continues to have abdominal distention. No measurements are documented, but I have measured him here in the office at 45 inches around the waist.He has no complaints of chest discomfort, DOE, or weakness. He continues to have chronic LEE.    Allergies  Allergen Reactions  . Morphine And Related Hives    Current Outpatient Prescriptions  Medication Sig Dispense Refill  . albuterol (PROVENTIL HFA;VENTOLIN HFA) 108 (90 BASE) MCG/ACT inhaler Inhale 2 puffs into the lungs every 6 (six) hours as needed. breathing      . aspirin 81 MG EC tablet Take 1 tablet (81 mg total) by mouth daily.  30 tablet    . budesonide (ENTOCORT EC) 3 MG 24 hr capsule Take 6 mg by mouth daily.       . colchicine 0.6 MG tablet Take 1 tablet (0.6 mg total) by mouth 2 (two) times daily.  60 tablet  1  . dicyclomine (BENTYL) 10 MG capsule Take 10 mg by mouth 3 (three) times daily as needed.       . ferrous sulfate 325 (65 FE) MG  tablet Take 325 mg by mouth 2 (two) times daily.      . fish oil-omega-3 fatty acids 1000 MG capsule Take 1 g by mouth 3 (three) times daily.      . furosemide (LASIX) 40 MG tablet Take 40 mg by mouth 2 (two) times daily.       . Multiple Vitamins-Minerals (MULTIVITAMINS THER. W/MINERALS) TABS Take 1 tablet by mouth 2 (two) times daily.       . ondansetron (ZOFRAN) 4 MG tablet Take 1 tablet (4 mg total) by mouth every 8 (eight) hours as needed for nausea.  30 tablet  1  . Oxycodone HCl 10 MG TABS Take 1 tablet (10 mg total) by mouth 3 (three) times daily as needed.  90 tablet  0  . oxymorphone (OPANA ER, CRUSH RESISTANT,) 10 MG T12A 12 hr tablet Take 1 tablet (10 mg total) by mouth every 12 (twelve) hours.  60 tablet  0  . pantoprazole (PROTONIX) 40 MG tablet Take 1 tablet (40 mg total) by mouth daily.  30 tablet  4  . potassium chloride (K-DUR) 10 MEQ tablet Take 20 mEq by mouth daily.       . Probiotic Product (ALIGN) 4 MG CAPS Take 4 mg by mouth daily.       Marland Kitchen spironolactone (ALDACTONE) 50 MG tablet Take 50 mg by mouth daily.       . sucralfate (CARAFATE)  1 G tablet Take 1 tablet (1 g total) by mouth 4 (four) times daily.  120 tablet  2  . ursodiol (ACTIGALL) 300 MG capsule Take 2 capsules (600 mg total) by mouth 2 (two) times daily.  120 capsule  1   No current facility-administered medications for this visit.    Past Medical History  Diagnosis Date  . 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 Hepatomegaly 05/04/2011  . Fatty liver 05/04/2011    "stage III fatty liver fibrosis" (11/15/2012)  . GERD (gastroesophageal reflux disease)   . Chronic liver disease     /notes 11/15/2012  . Pericarditis     David [name] 11/15/2012  . Hypertension   . Pneumonia 1977  . Shortness of breath     "all the time right now" (11/15/2012)  . History of blood transfusion 2004  . History of stomach ulcers   . Duodenal ulcer     . Depression   . Kidney stones     bilaterally/notes 11/15/2012  . Hepatic fibrosis     David [name] 11/15/2012  . Anemia   . Pericardial effusion 10/29/2012    moderate to large/notes 11/15/2012  . Anxiety   . Hepatitis   . Crohn's disease     Past Surgical History  Procedure Laterality Date  . Anal examination under anesthesia  02/11/2011    [name]  . Treatment fistula anal  07/03/11    This was a second surgery to repair Anal fistula  . Small intestine surgery  2004    "had hole cut in small intestines during endoscopy; had to have OR & leave me open 3 months" (11/15/2012)  . Cholecystectomy  ~ 2003  . Appendectomy  ~ 2003  . Video assisted thoracoscopy  11/17/2012    Procedure: VIDEO ASSISTED THORACOSCOPY;  Surgeon: Loreli Slot, MD;  Location: [facility] OR;  Service: Thoracic;  Laterality: Left;  drainage of left pleural effusion  . Pericardial window  11/17/2012    Procedure: PERICARDIAL WINDOW;  Surgeon: Loreli Slot, MD;  Location: [facility] OR;  Service: Thoracic;  Laterality: N/A;  . Esophagogastroduodenoscopy  01/06/2013    Procedure: ESOPHAGOGASTRODUODENOSCOPY (EGD);  Surgeon: Malissa Hippo, MD;  Location: [facility];  Service: Endoscopy;  Laterality: N/A;  11:15    ROS: PHYSICAL EXAM BP 126/88  Pulse 91  Ht 5\' 11"  (1.803 m)  Wt 224 lb (101.606 kg)  BMI 31.26 kg/m2  SpO2 98%  EKG:  ASSESSMENT AND PLAN

## 2013-03-01 NOTE — Progress Notes (Deleted)
Name: David [name]    DOB: 1976/01/20  Age: 37 y.o.  MR#: [id number]       PCP:  [name], MD      Insurance: Payor: MEDICARE  Plan: MEDICARE PART A AND B  Product Type: *No Product type*    CC:   No chief complaint on file.   VS Filed Vitals:   03/01/13 1039  BP: 126/88  Pulse: 91  Height: 5\' 11"  (1.803 m)  Weight: 224 lb (101.606 kg)  SpO2: 98%    Weights Current Weight  03/01/13 224 lb (101.606 kg)  02/27/13 226 lb 4.8 oz (102.649 kg)  12/28/12 227 lb (102.967 kg)    Blood Pressure  BP Readings from Last 3 Encounters:  03/01/13 126/88  02/27/13 130/72  01/06/13 107/59     Admit date:  (Not on file) Last encounter with RMR:  12/05/2012   Allergy Morphine and related  Current Outpatient Prescriptions  Medication Sig Dispense Refill  . albuterol (PROVENTIL HFA;VENTOLIN HFA) 108 (90 BASE) MCG/ACT inhaler Inhale 2 puffs into the lungs every 6 (six) hours as needed. breathing      . aspirin 81 MG EC tablet Take 1 tablet (81 mg total) by mouth daily.  30 tablet    . budesonide (ENTOCORT EC) 3 MG 24 hr capsule Take 6 mg by mouth daily.       . colchicine 0.6 MG tablet Take 1 tablet (0.6 mg total) by mouth 2 (two) times daily.  60 tablet  1  . dicyclomine (BENTYL) 10 MG capsule Take 10 mg by mouth 3 (three) times daily as needed.       . ferrous sulfate 325 (65 FE) MG tablet Take 325 mg by mouth 2 (two) times daily.      . fish oil-omega-3 fatty acids 1000 MG capsule Take 1 g by mouth 3 (three) times daily.      . furosemide (LASIX) 40 MG tablet Take 40 mg by mouth 2 (two) times daily.       . Multiple Vitamins-Minerals (MULTIVITAMINS THER. W/MINERALS) TABS Take 1 tablet by mouth 2 (two) times daily.       . ondansetron (ZOFRAN) 4 MG tablet Take 1 tablet (4 mg total) by mouth every 8 (eight) hours as needed for nausea.  30 tablet  1  . Oxycodone HCl 10 MG TABS Take 1 tablet (10 mg total) by mouth 3 (three) times daily as needed.  90 tablet  0  . oxymorphone (OPANA ER,  CRUSH RESISTANT,) 10 MG T12A 12 hr tablet Take 1 tablet (10 mg total) by mouth every 12 (twelve) hours.  60 tablet  0  . pantoprazole (PROTONIX) 40 MG tablet Take 1 tablet (40 mg total) by mouth daily.  30 tablet  4  . potassium chloride (K-DUR) 10 MEQ tablet Take 20 mEq by mouth daily.       . Probiotic Product (ALIGN) 4 MG CAPS Take 4 mg by mouth daily.       Marland Kitchen spironolactone (ALDACTONE) 50 MG tablet Take 50 mg by mouth daily.       . sucralfate (CARAFATE) 1 G tablet Take 1 tablet (1 g total) by mouth 4 (four) times daily.  120 tablet  2  . ursodiol (ACTIGALL) 300 MG capsule Take 2 capsules (600 mg total) by mouth 2 (two) times daily.  120 capsule  1   No current facility-administered medications for this visit.    Discontinued Meds:   There are no discontinued  medications.  Patient Active Problem List  Diagnosis  . 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 GERD (gastroesophageal reflux disease)  . Hepatic fibrosis  . Abdominal pain, chronic, right lower quadrant  . Bilateral kidney stones  . Hypertension  . Anemia  . Fluid overload  . Pericarditis with effusion  . Long-term use of immunosuppressant medication  . Pleural effusion  . Bilateral leg edema    LABS    Component Value Date/Time   NA 140 12/26/2012 1345   NA 136 12/02/2012 1111   NA 136 11/22/2012 0606   K 3.9 12/26/2012 1345   K 4.6 12/02/2012 1111   K 4.8 11/22/2012 0606   CL 105 12/26/2012 1345   CL 102 12/02/2012 1111   CL 102 11/22/2012 0606   CO2 26 12/26/2012 1345   CO2 26 12/02/2012 1111   CO2 26 11/22/2012 0606   GLUCOSE 91 12/26/2012 1345   GLUCOSE 90 12/02/2012 1111   GLUCOSE 91 11/22/2012 0606   BUN 10 12/26/2012 1345   BUN 12 12/02/2012 1111   BUN 11 11/22/2012 0606   CREATININE 0.61 12/26/2012 1345   CREATININE 0.67 12/02/2012 1111   CREATININE 0.66 11/22/2012 0606   CREATININE 0.60 11/20/2012 0440   CREATININE 0.96 11/19/2012 0340   CREATININE 0.68 10/04/2012 0130    CALCIUM 8.2* 12/26/2012 1345   CALCIUM 8.0* 12/02/2012 1111   CALCIUM 8.3* 11/22/2012 0606   GFRNONAA >90 11/22/2012 0606   GFRNONAA >90 11/20/2012 0440   GFRNONAA >90 11/19/2012 0340   GFRAA >90 11/22/2012 0606   GFRAA >90 11/20/2012 0440   GFRAA >90 11/19/2012 0340   CMP     Component Value Date/Time   NA 140 12/26/2012 1345   K 3.9 12/26/2012 1345   CL 105 12/26/2012 1345   CO2 26 12/26/2012 1345   GLUCOSE 91 12/26/2012 1345   BUN 10 12/26/2012 1345   CREATININE 0.61 12/26/2012 1345   CREATININE 0.66 11/22/2012 0606   CALCIUM 8.2* 12/26/2012 1345   PROT 6.1 12/26/2012 1345   ALBUMIN 2.9* 12/26/2012 1345   AST 25 12/26/2012 1345   ALT 14 12/26/2012 1345   ALKPHOS 174* 12/26/2012 1345   BILITOT 0.6 12/26/2012 1345   GFRNONAA >90 11/22/2012 0606   GFRAA >90 11/22/2012 0606       Component Value Date/Time   WBC 7.5 12/26/2012 1345   WBC 10.4 12/02/2012 1111   WBC 6.3 11/20/2012 0440   HGB 9.0* 12/26/2012 1345   HGB 9.3* 12/02/2012 1111   HGB 8.5* 11/20/2012 0440   HCT 28.8* 12/26/2012 1345   HCT 29.9* 12/02/2012 1111   HCT 26.2* 11/20/2012 0440   MCV 73.7* 12/26/2012 1345   MCV 79.1 12/02/2012 1111   MCV 80.9 11/20/2012 0440    Lipid Panel  No results found for this basename: chol, trig, hdl, cholhdl, vldl, ldlcalc    ABG    Component Value Date/Time   PHART 7.430 11/18/2012 0408   PCO2ART 36.0 11/18/2012 0408   PO2ART 82.0 11/18/2012 0408   HCO3 24.0 11/18/2012 0408   TCO2 25 11/18/2012 0408   O2SAT 96.0 11/18/2012 0408     Lab Results  Component Value Date   TSH 2.969 11/15/2012   BNP (last 3 results)  Recent Labs  11/15/12 1845  PROBNP 1454.0*   Cardiac Panel (last 3 results) No results found for this basename: CKTOTAL, CKMB, TROPONINI, RELINDX,  in the last 72 hours  Iron/TIBC/Ferritin    Component Value  Date/Time   IRON 15* 07/21/2011 1205   TIBC 388 07/21/2011 1205   FERRITIN 75 07/21/2011 1205     EKG Orders placed in visit on 12/05/12  . EKG 12-LEAD     Prior  Assessment and Plan Problem List as of 03/01/2013     ICD-9-CM     Cardiology Problems   Hypertension   Last Assessment & Plan   12/05/2012 Office Visit Written 12/05/2012  4:37 PM by Jodelle Gross, NP     Excellent control of BP with only lasix and spironolactone. BP  Soft at 115/77. Will not add any other agents at this time. If remains tachycardic, consider adding low dose BB at Dr.Wall's discretion.     Pericarditis with effusion   Last Assessment & Plan   12/28/2012 Office Visit Written 12/28/2012  2:20 PM by Gaylord Shih, MD     This was nonspecific inflammation on biopsy but the pericardium was very beefy red per Dr. Dorris Fetch. Discussed with Dr.Rehman and will continue colchicine.We'll repeat echocardiogram this week to rule out any recurrent pericardial effusion. This swelling could be explained by him stopping his p.m. dose of Lasix a week or so ago. We'll increase his Lasix to 40 mg by mouth twice a day and I've asked to take a dose today. He'll remain on potassium and spironolactone. Recent labs were stable. He will keep a daily weight chart to let us know if his weight continues to climb. We may need to increase his Lasix further. He will follow with GI next week.      Other   Crohn's disease of both small and large intestine with fistula   GERD (gastroesophageal reflux disease)   Hepatic fibrosis   Last Assessment & Plan   11/15/2012 Office Visit Written 11/15/2012  4:14 PM by Jodelle Gross, NP     GI will be consulted for this diagnosis and its contribution to fluid overload, probable ascites.    Abdominal pain, chronic, right lower quadrant   Bilateral kidney stones   Anemia   Last Assessment & Plan   12/05/2012 Office Visit Written 12/05/2012  4:40 PM by Jodelle Gross, NP     Significant improvement in status with most recent Hgb  9.3 and Hct 29.9. He is no longer pale. Continued assessment and treatment by Dr. Karilyn Cota.     Fluid overload   Last Assessment  & Plan   12/05/2012 Office Visit Written 12/05/2012  4:42 PM by Jodelle Gross, NP     Improved with 25 lb wt loss since last visit. He has continued non-pitting edema in the upper thighs and pre-tibial area. He is keeping his legs elevated as much as possible and continues on diuretics. Lasix 80 mg BID with spironolactone.  Good renal fx without evidence of hyperkalemia on recent blood work. He will have labs drawn before next visit.    Long-term use of immunosuppressant medication   Pleural effusion   Last Assessment & Plan   12/28/2012 Office Visit Written 12/28/2012  2:23 PM by Gaylord Shih, MD     Improved by last chest x-ray. Physical exam reveals perhaps small bilateral effusions. Increase Lasix as noted under pericardial disease.    Bilateral leg edema       Imaging: No results found.

## 2013-03-01 NOTE — Assessment & Plan Note (Signed)
He is doing extremely well from a cardiac standpoint. No recurrent evidence of shortness of breath pressure in his chest chest pain. No evidence of rubs on auscultation of cardiac anatomy. The patient has also been seen and examined by Dr. Juanito Doom in clinic. Discussion with the patient concerning restarting Remicade was completed. Restarting this per Dr. Daleen Squibb constitutes a low risk or recurrence of pericardial effusion. There is a low likelihood of recurrence of adhesions around pericardium. He can start Remicade per Dr. Dionicia Abler. We will repeat his echocardiogram in 8 weeks and see him again in 3 months. He is aware of the risks involved of starting Remicade HTN and he is willing to take them and proceed with dosing. He will call his for recurrence of chest pain, shortness of breath, or  chest pressure. He will continue on colchicine as directed.

## 2013-03-01 NOTE — Assessment & Plan Note (Signed)
Excellent control of blood pressure. He continues on Lasix 40 mg twice a day, spinal lactone 50 mg daily, and aspirin. Weight has remained stable. No changes in current medication regimen. Echocardiogram completed in January 2014 revealed normal LV function with an EF of 60%.

## 2013-03-01 NOTE — Progress Notes (Signed)
HPI: Mr. Mclees returns today for the evaluation and management of his history of a inflammatory pericarditis with a large pericardial effusion. He underwent a VATS on 11/17/2012 by Dr. Dorris Fetch. Cytology, cultures, both fluid and tissue were nonrevealing. He's been treated with colcichine since surgery. He denies any chest pain or any symptoms similar to what he had prior surgery. Recent upper GI showed prominent folds.He continues to have 5-8 loose stools daily.  He has had a EGD completed on 01/06/2013 demonstrating errosive reflux esophagitis, large amount of bile and some food debris in the stomach.with patent gastrojejunostomy and patent pylorus. Portal gastropathy without evidence of varices. Normal examination of bulb, second part of the duodenum while pyloric channel and normal afferent and efferent loops.  Suspect symptoms may be due to bile reflux.   Since being seen last by Dr. Daleen Squibb, he has been seen by Dr. Karilyn Cota who would like to start him on Remacaid secondary to active Crohns symptoms.Marland Kitchen He continues to have abdominal distention. No measurements are documented, but I have measured him here in the office at 45 inches around the waist.He has no complaints of chest discomfort, DOE, or weakness. He continues to have chronic LEE.    Allergies  Allergen Reactions  . Morphine And Related Hives    Current Outpatient Prescriptions  Medication Sig Dispense Refill  . albuterol (PROVENTIL HFA;VENTOLIN HFA) 108 (90 BASE) MCG/ACT inhaler Inhale 2 puffs into the lungs every 6 (six) hours as needed. breathing      . aspirin 81 MG EC tablet Take 1 tablet (81 mg total) by mouth daily.  30 tablet    . budesonide (ENTOCORT EC) 3 MG 24 hr capsule Take 6 mg by mouth daily.       . colchicine 0.6 MG tablet Take 1 tablet (0.6 mg total) by mouth 2 (two) times daily.  60 tablet  1  . dicyclomine (BENTYL) 10 MG capsule Take 10 mg by mouth 3 (three) times daily as needed.       . ferrous sulfate 325 (65  FE) MG tablet Take 325 mg by mouth 2 (two) times daily.      . fish oil-omega-3 fatty acids 1000 MG capsule Take 1 g by mouth 3 (three) times daily.      . furosemide (LASIX) 40 MG tablet Take 40 mg by mouth 2 (two) times daily.       . Multiple Vitamins-Minerals (MULTIVITAMINS THER. W/MINERALS) TABS Take 1 tablet by mouth 2 (two) times daily.       . ondansetron (ZOFRAN) 4 MG tablet Take 1 tablet (4 mg total) by mouth every 8 (eight) hours as needed for nausea.  30 tablet  1  . Oxycodone HCl 10 MG TABS Take 1 tablet (10 mg total) by mouth 3 (three) times daily as needed.  90 tablet  0  . oxymorphone (OPANA ER, CRUSH RESISTANT,) 10 MG T12A 12 hr tablet Take 1 tablet (10 mg total) by mouth every 12 (twelve) hours.  60 tablet  0  . pantoprazole (PROTONIX) 40 MG tablet Take 1 tablet (40 mg total) by mouth daily.  30 tablet  4  . potassium chloride (K-DUR) 10 MEQ tablet Take 20 mEq by mouth daily.       . Probiotic Product (ALIGN) 4 MG CAPS Take 4 mg by mouth daily.       Marland Kitchen spironolactone (ALDACTONE) 50 MG tablet Take 50 mg by mouth daily.       . sucralfate (CARAFATE) 1  G tablet Take 1 tablet (1 g total) by mouth 4 (four) times daily.  120 tablet  2  . ursodiol (ACTIGALL) 300 MG capsule Take 2 capsules (600 mg total) by mouth 2 (two) times daily.  120 capsule  1   No current facility-administered medications for this visit.    Past Medical History  Diagnosis Date  . 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 Hepatomegaly 05/04/2011  . Fatty liver 05/04/2011    "stage III fatty liver fibrosis" (11/15/2012)  . GERD (gastroesophageal reflux disease)   . Chronic liver disease     /notes 11/15/2012  . Pericarditis     [name] 11/15/2012  . Hypertension   . Pneumonia 1977  . Shortness of breath     "all the time right now" (11/15/2012)  . History of blood transfusion 2004  . History of stomach ulcers   . Duodenal  ulcer   . Depression   . Kidney stones     bilaterally/notes 11/15/2012  . Hepatic fibrosis     [name] 11/15/2012  . Anemia   . Pericardial effusion 10/29/2012    moderate to large/notes 11/15/2012  . Anxiety   . Hepatitis   . Crohn's disease     Past Surgical History  Procedure Laterality Date  . Anal examination under anesthesia  02/11/2011    [name]  . Treatment fistula anal  07/03/11    This was a second surgery to repair Anal fistula  . Small intestine surgery  2004    "had hole cut in small intestines during endoscopy; had to have OR & leave me open 3 months" (11/15/2012)  . Cholecystectomy  ~ 2003  . Appendectomy  ~ 2003  . Video assisted thoracoscopy  11/17/2012    Procedure: VIDEO ASSISTED THORACOSCOPY;  Surgeon: Loreli Slot, MD;  Location: [facility] OR;  Service: Thoracic;  Laterality: Left;  drainage of left pleural effusion  . Pericardial window  11/17/2012    Procedure: PERICARDIAL WINDOW;  Surgeon: Loreli Slot, MD;  Location: [facility] OR;  Service: Thoracic;  Laterality: N/A;  . Esophagogastroduodenoscopy  01/06/2013    Procedure: ESOPHAGOGASTRODUODENOSCOPY (EGD);  Surgeon: Malissa Hippo, MD;  Location: [facility];  Service: Endoscopy;  Laterality: N/A;  11:15    ZOX:[id number] of systems complete and found to be negative unless listed above  PHYSICAL EXAM BP 126/88  Pulse 91  Ht 5\' 11"  (1.803 m)  Wt 224 lb (101.606 kg)  BMI 31.26 kg/m2  SpO2 98% General: Well developed, well nourished, in no acute distress Head: Eyes PERRLA, No xanthomas.   Normal cephalic and atramatic  Lungs: Clear bilaterally to auscultation and percussion. Heart: HRRR S1 S2, without MRG.  Pulses are 2+ & equal.            No carotid bruit. No JVD.  No abdominal bruits. No femoral bruits. Abdomen: Bowel sounds are positive, abdomen distended, tenderness is noted on palpation, abdominal girth at 45 inches in diameter, without masses or                  Hernia's noted. Well-healed  abdominal incision, with scar noted Msk:  Back normal, normal gait. Normal strength and tone for age. Extremities: No clubbing, cyanosis 1+ to 2+ edema in the lower extremeties  bilaterally.  DP +1 Neuro: Alert and oriented X 3. Psych:  Good affect, responds appropriately    ASSESSMENT AND PLAN

## 2013-03-01 NOTE — Assessment & Plan Note (Signed)
Remains chronic for him. With increased abdominal girth and pressure on the vascular system of the lower extremities he will have evidence of this. He will continue on Lasix as directed. Keeping his legs elevated as much as possible will be helpful for him. Will not place on any support hose at this time. He denies any PND or orthopnea associated with his abdominal distention. His recent labs were completed in January with normal creatinine at 0.91. He continued to have elevated LFTs.

## 2013-03-01 NOTE — Patient Instructions (Addendum)
Your physician recommends that you schedule a follow-up appointment in: 3 months  Your physician has requested that you have an echocardiogram in 8 weeks. Echocardiography is a painless test that uses sound waves to create images of your heart. It provides your doctor with information about the size and shape of your heart and how well your heart's chambers and valves are working. This procedure takes approximately one hour. There are no restrictions for this procedure.

## 2013-03-01 NOTE — Assessment & Plan Note (Signed)
He continues to have symptoms of abdominal pain with frequent loose stools which she states are 5-8 a day. I have not measured his abdominal girth at 45 inches in diameter. Bowel sounds are normal. Followup per Dr. Dionicia Abler for ongoing treatment and diagnostic testing at his discretion. As stated Remicade can be started.

## 2013-03-09 ENCOUNTER — Ambulatory Visit (HOSPITAL_COMMUNITY): Payer: Medicare Other

## 2013-03-13 ENCOUNTER — Encounter (HOSPITAL_COMMUNITY): Payer: Medicare Other | Attending: Internal Medicine

## 2013-03-13 VITALS — BP 123/83 | HR 100 | Temp 98.2°F | Resp 16

## 2013-03-13 DIAGNOSIS — K5080 Crohn's disease of both small and large intestine without complications: Secondary | ICD-10-CM | POA: Insufficient documentation

## 2013-03-13 DIAGNOSIS — K50813 Crohn's disease of both small and large intestine with fistula: Secondary | ICD-10-CM

## 2013-03-13 MED ORDER — SODIUM CHLORIDE 0.9 % IJ SOLN
10.0000 mL | INTRAMUSCULAR | Status: DC | PRN
  Administered 2013-03-13: 10 mL via INTRAVENOUS

## 2013-03-13 MED ORDER — SODIUM CHLORIDE 0.9 % IV SOLN
500.0000 mg | Freq: Once | INTRAVENOUS | Status: AC
  Administered 2013-03-13: 500 mg via INTRAVENOUS
  Filled 2013-03-13: qty 50

## 2013-03-13 MED ORDER — SODIUM CHLORIDE 0.9 % IV SOLN
INTRAVENOUS | Status: DC
  Administered 2013-03-13: 11:00:00 via INTRAVENOUS

## 2013-03-13 NOTE — Progress Notes (Signed)
Tolerated infusion well. 

## 2013-03-22 ENCOUNTER — Other Ambulatory Visit (INDEPENDENT_AMBULATORY_CARE_PROVIDER_SITE_OTHER): Payer: Self-pay | Admitting: Internal Medicine

## 2013-03-22 ENCOUNTER — Telehealth (INDEPENDENT_AMBULATORY_CARE_PROVIDER_SITE_OTHER): Payer: Self-pay | Admitting: *Deleted

## 2013-03-22 MED ORDER — OXYCODONE HCL 10 MG PO TABS: 10.0000 mg | ORAL_TABLET | Freq: Three times a day (TID) | ORAL | Status: DC | PRN

## 2013-03-22 NOTE — Telephone Encounter (Signed)
David [name] called our office on 03/21/13. He states that he was going on a field trip with his son ,[name] , on Thursday April 10 , returning on April 11 , late. His refill on the Oxycodone is due to be written on April 11 th. He was asking if it could be written prior to this date. Per Dr.Rehman , patient may but we will need to count his pills. The patient's mother , [name] , is keeping all of his medications with her and fixes them for [name]. Today [name] has 6 pills in his bottle of Oxycodone.  [name] [name],NP wrote the prescription for the refill and it is in the patient record. Patient picked up the prescription.

## 2013-03-23 ENCOUNTER — Ambulatory Visit (HOSPITAL_COMMUNITY): Payer: Medicare Other

## 2013-03-27 ENCOUNTER — Other Ambulatory Visit (INDEPENDENT_AMBULATORY_CARE_PROVIDER_SITE_OTHER): Payer: Self-pay | Admitting: *Deleted

## 2013-03-27 DIAGNOSIS — K5090 Crohn's disease, unspecified, without complications: Secondary | ICD-10-CM

## 2013-03-27 MED ORDER — DICYCLOMINE HCL 10 MG PO CAPS: 10.0000 mg | ORAL_CAPSULE | Freq: Three times a day (TID) | ORAL | Status: DC | PRN

## 2013-03-27 NOTE — Telephone Encounter (Signed)
Patient called in for a refill.

## 2013-04-03 ENCOUNTER — Telehealth (INDEPENDENT_AMBULATORY_CARE_PROVIDER_SITE_OTHER): Payer: Self-pay | Admitting: *Deleted

## 2013-04-03 DIAGNOSIS — R109 Unspecified abdominal pain: Secondary | ICD-10-CM

## 2013-04-03 MED ORDER — OXYMORPHONE HCL ER 10 MG PO T12A: 10.0000 mg | EXTENDED_RELEASE_TABLET | Freq: Two times a day (BID) | ORAL | Status: DC

## 2013-04-03 NOTE — Telephone Encounter (Signed)
[name] called Dr. Karilyn Cota to question about this refill. Rx given to [name] and patient picked it up.

## 2013-04-03 NOTE — Telephone Encounter (Signed)
Needs his Opana refilled. [place] will be out after today. His return phone number is [phone].

## 2013-04-03 NOTE — Telephone Encounter (Signed)
[name], this needs to go to Dr. Karilyn Cota.

## 2013-04-10 ENCOUNTER — Ambulatory Visit (INDEPENDENT_AMBULATORY_CARE_PROVIDER_SITE_OTHER): Payer: Medicare Other | Admitting: Family Medicine

## 2013-04-10 ENCOUNTER — Encounter: Payer: Self-pay | Admitting: Family Medicine

## 2013-04-10 VITALS — BP 128/82 | Temp 98.4°F | Wt 219.8 lb

## 2013-04-10 DIAGNOSIS — R300 Dysuria: Secondary | ICD-10-CM

## 2013-04-10 LAB — POCT URINALYSIS DIPSTICK
Protein, UA: 100
Spec Grav, UA: <=1.005
pH, UA: 7

## 2013-04-10 MED ORDER — CIPROFLOXACIN HCL 500 MG PO TABS: 500.0000 mg | ORAL_TABLET | Freq: Two times a day (BID) | ORAL | Status: DC

## 2013-04-10 MED ORDER — CEFTRIAXONE SODIUM 1 G IJ SOLR
1.0000 g | Freq: Once | INTRAMUSCULAR | Status: AC
  Administered 2013-04-10: 1 g via INTRAMUSCULAR

## 2013-04-10 NOTE — Progress Notes (Signed)
  Subjective:    Patient ID: David [name], male    DOB: 16-Dec-1975, 37 y.o.   MRN: [id number]  Dysuria  This is a new problem. The current episode started in the past 7 days. The problem occurs every urination. The problem has been gradually worsening. The pain is at a severity of 5/10. The pain is mild. The maximum temperature recorded prior to his arrival was 103 - 104 F. The fever has been present for less than 1 day. There is no history of pyelonephritis. Associated symptoms include chills. He has tried nothing for the symptoms.   Pos hx of kidneys stone. req lithotripsy. Pos hx of chron's  Review of Systems  Constitutional: Positive for chills.  Genitourinary: Positive for dysuria.   patient states overall diminished energy. Notes back pain lower mid back.     Objective:   Physical Exam  Alert, chronically sick appearing. No major distress. Vitals reviewed. Lungs clear. Heart regular in rhythm. Abdomen good bowel sounds no discrete tenderness or scarring noted. Probable ascites palpated. Low mid back tender to palpation. Urinalysis too numerous to count red blood cells a few white blood cells      Assessment & Plan:  Impression #1 pyelonephritis with secondary hematuria. Question stone element but no true colicky pain. Patient certainly fragile with chronic Remicade injections and Crohn's disease. Plan Rocephin Cipro twice a day. Symptomatic care discussed. Recheck her persists.

## 2013-04-11 ENCOUNTER — Ambulatory Visit (INDEPENDENT_AMBULATORY_CARE_PROVIDER_SITE_OTHER): Payer: Medicare Other | Admitting: Family Medicine

## 2013-04-11 ENCOUNTER — Inpatient Hospital Stay (HOSPITAL_COMMUNITY)
Admission: EM | Admit: 2013-04-11 | Discharge: 2013-04-13 | DRG: 683 | Disposition: A | Discharge status: Home / Self Care | Payer: Medicare Other | Attending: Internal Medicine | Admitting: Internal Medicine

## 2013-04-11 ENCOUNTER — Emergency Department (HOSPITAL_COMMUNITY): Payer: Medicare Other

## 2013-04-11 ENCOUNTER — Encounter: Payer: Self-pay | Admitting: Family Medicine

## 2013-04-11 ENCOUNTER — Encounter (HOSPITAL_COMMUNITY): Payer: Self-pay | Admitting: *Deleted

## 2013-04-11 ENCOUNTER — Ambulatory Visit (HOSPITAL_COMMUNITY): Admission: RE | Admit: 2013-04-11 | Payer: Medicare Other | Source: Ambulatory Visit

## 2013-04-11 VITALS — BP 138/86

## 2013-04-11 DIAGNOSIS — D649 Anemia, unspecified: Secondary | ICD-10-CM

## 2013-04-11 DIAGNOSIS — F329 Major depressive disorder, single episode, unspecified: Secondary | ICD-10-CM | POA: Diagnosis present

## 2013-04-11 DIAGNOSIS — N289 Disorder of kidney and ureter, unspecified: Secondary | ICD-10-CM

## 2013-04-11 DIAGNOSIS — K5080 Crohn's disease of both small and large intestine without complications: Secondary | ICD-10-CM | POA: Diagnosis present

## 2013-04-11 DIAGNOSIS — J90 Pleural effusion, not elsewhere classified: Secondary | ICD-10-CM

## 2013-04-11 DIAGNOSIS — Z87891 Personal history of nicotine dependence: Secondary | ICD-10-CM

## 2013-04-11 DIAGNOSIS — N200 Calculus of kidney: Secondary | ICD-10-CM

## 2013-04-11 DIAGNOSIS — R188 Other ascites: Secondary | ICD-10-CM

## 2013-04-11 DIAGNOSIS — Z885 Allergy status to narcotic agent status: Secondary | ICD-10-CM

## 2013-04-11 DIAGNOSIS — D509 Iron deficiency anemia, unspecified: Secondary | ICD-10-CM | POA: Diagnosis present

## 2013-04-11 DIAGNOSIS — F3289 Other specified depressive episodes: Secondary | ICD-10-CM | POA: Diagnosis present

## 2013-04-11 DIAGNOSIS — E8770 Fluid overload, unspecified: Secondary | ICD-10-CM

## 2013-04-11 DIAGNOSIS — Z7982 Long term (current) use of aspirin: Secondary | ICD-10-CM

## 2013-04-11 DIAGNOSIS — K7460 Unspecified cirrhosis of liver: Secondary | ICD-10-CM | POA: Diagnosis present

## 2013-04-11 DIAGNOSIS — I319 Disease of pericardium, unspecified: Secondary | ICD-10-CM

## 2013-04-11 DIAGNOSIS — R600 Localized edema: Secondary | ICD-10-CM

## 2013-04-11 DIAGNOSIS — I10 Essential (primary) hypertension: Secondary | ICD-10-CM | POA: Diagnosis present

## 2013-04-11 DIAGNOSIS — F411 Generalized anxiety disorder: Secondary | ICD-10-CM | POA: Diagnosis present

## 2013-04-11 DIAGNOSIS — Z79899 Other long term (current) drug therapy: Secondary | ICD-10-CM

## 2013-04-11 DIAGNOSIS — R319 Hematuria, unspecified: Secondary | ICD-10-CM

## 2013-04-11 DIAGNOSIS — E876 Hypokalemia: Secondary | ICD-10-CM | POA: Diagnosis present

## 2013-04-11 DIAGNOSIS — N059 Unspecified nephritic syndrome with unspecified morphologic changes: Secondary | ICD-10-CM | POA: Diagnosis present

## 2013-04-11 DIAGNOSIS — K7689 Other specified diseases of liver: Secondary | ICD-10-CM | POA: Diagnosis present

## 2013-04-11 DIAGNOSIS — N179 Acute kidney failure, unspecified: Principal | ICD-10-CM | POA: Diagnosis present

## 2013-04-11 DIAGNOSIS — K219 Gastro-esophageal reflux disease without esophagitis: Secondary | ICD-10-CM | POA: Diagnosis present

## 2013-04-11 DIAGNOSIS — R609 Edema, unspecified: Secondary | ICD-10-CM

## 2013-04-11 DIAGNOSIS — K50813 Crohn's disease of both small and large intestine with fistula: Secondary | ICD-10-CM

## 2013-04-11 LAB — CBC
MCH: 24.8 pg — ABNORMAL LOW (ref 26.0–34.0)
MCHC: 32.8 g/dL (ref 30.0–36.0)
Platelets: 185 10*3/uL (ref 150–400)

## 2013-04-11 LAB — CREATININE, SERUM: Creatinine, Ser: 2.26 mg/dL — ABNORMAL HIGH (ref 0.50–1.35)

## 2013-04-11 LAB — CBC WITH DIFFERENTIAL/PLATELET
Basophils Absolute: 0 10*3/uL (ref 0.0–0.1)
Basophils Relative: 0 % (ref 0–1)
Hemoglobin: 8.4 g/dL — ABNORMAL LOW (ref 13.0–17.0)
MCHC: 32.6 g/dL (ref 30.0–36.0)
Monocytes Relative: 8 % (ref 3–12)
Neutro Abs: 5.7 10*3/uL (ref 1.7–7.7)
Neutrophils Relative %: 78 % — ABNORMAL HIGH (ref 43–77)
Platelets: 196 10*3/uL (ref 150–400)
RDW: 19.9 % — ABNORMAL HIGH (ref 11.5–15.5)

## 2013-04-11 LAB — URINALYSIS, ROUTINE W REFLEX MICROSCOPIC
Leukocytes, UA: NEGATIVE
Protein, ur: 100 mg/dL — AB
Urobilinogen, UA: 0.2 mg/dL (ref 0.0–1.0)

## 2013-04-11 LAB — BASIC METABOLIC PANEL
Potassium: 4.5 mEq/L (ref 3.5–5.3)
Sodium: 135 mEq/L (ref 135–145)

## 2013-04-11 LAB — HEPATIC FUNCTION PANEL
AST: 40 U/L — ABNORMAL HIGH (ref 0–37)
Alkaline Phosphatase: 292 U/L — ABNORMAL HIGH (ref 39–117)
Bilirubin, Direct: 0.1 mg/dL (ref 0.0–0.3)
Indirect Bilirubin: 0.1 mg/dL (ref 0.0–0.9)
Total Bilirubin: 0.2 mg/dL — ABNORMAL LOW (ref 0.3–1.2)

## 2013-04-11 LAB — RETICULOCYTES: Retic Count, Absolute: 25.4 10*3/uL (ref 19.0–186.0)

## 2013-04-11 LAB — URINE MICROSCOPIC-ADD ON

## 2013-04-11 MED ORDER — SUCRALFATE 1 G PO TABS
1.0000 g | ORAL_TABLET | Freq: Four times a day (QID) | ORAL | Status: DC
  Administered 2013-04-11 – 2013-04-13 (×7): 1 g via ORAL
  Filled 2013-04-11 (×7): qty 1

## 2013-04-11 MED ORDER — ONDANSETRON HCL 4 MG PO TABS: 4.0000 mg | ORAL_TABLET | Freq: Four times a day (QID) | ORAL | Status: DC | PRN

## 2013-04-11 MED ORDER — OXYCODONE-ACETAMINOPHEN 5-325 MG PO TABS
1.0000 | ORAL_TABLET | Freq: Once | ORAL | Status: AC
  Administered 2013-04-11: 1 via ORAL
  Filled 2013-04-11: qty 1

## 2013-04-11 MED ORDER — ONDANSETRON HCL 4 MG PO TABS: 4.0000 mg | ORAL_TABLET | Freq: Three times a day (TID) | ORAL | Status: DC | PRN

## 2013-04-11 MED ORDER — PANTOPRAZOLE SODIUM 40 MG PO TBEC
40.0000 mg | DELAYED_RELEASE_TABLET | Freq: Every day | ORAL | Status: DC
  Administered 2013-04-11 – 2013-04-13 (×3): 40 mg via ORAL
  Filled 2013-04-11 (×3): qty 1

## 2013-04-11 MED ORDER — OMEGA-3 FATTY ACIDS 1000 MG PO CAPS: 1.0000 g | ORAL_CAPSULE | Freq: Three times a day (TID) | ORAL | Status: DC

## 2013-04-11 MED ORDER — BUDESONIDE 3 MG PO CP24
6.0000 mg | ORAL_CAPSULE | Freq: Every day | ORAL | Status: DC
  Administered 2013-04-11 – 2013-04-13 (×3): 6 mg via ORAL
  Filled 2013-04-11 (×4): qty 2

## 2013-04-11 MED ORDER — URSODIOL 300 MG PO CAPS
600.0000 mg | ORAL_CAPSULE | Freq: Two times a day (BID) | ORAL | Status: DC
  Administered 2013-04-11 – 2013-04-13 (×4): 600 mg via ORAL
  Filled 2013-04-11 (×6): qty 2

## 2013-04-11 MED ORDER — POTASSIUM CHLORIDE CRYS ER 10 MEQ PO TBCR
20.0000 meq | EXTENDED_RELEASE_TABLET | Freq: Every day | ORAL | Status: DC
  Administered 2013-04-11: 20 meq via ORAL
  Filled 2013-04-11: qty 2

## 2013-04-11 MED ORDER — FERROUS SULFATE 325 (65 FE) MG PO TABS
325.0000 mg | ORAL_TABLET | Freq: Two times a day (BID) | ORAL | Status: DC
  Administered 2013-04-11 – 2013-04-13 (×4): 325 mg via ORAL
  Filled 2013-04-11 (×4): qty 1

## 2013-04-11 MED ORDER — ADULT MULTIVITAMIN W/MINERALS CH
1.0000 | ORAL_TABLET | Freq: Two times a day (BID) | ORAL | Status: DC
  Administered 2013-04-11 – 2013-04-13 (×4): 1 via ORAL
  Filled 2013-04-11 (×4): qty 1

## 2013-04-11 MED ORDER — FUROSEMIDE 10 MG/ML IJ SOLN
80.0000 mg | Freq: Two times a day (BID) | INTRAMUSCULAR | Status: DC
  Administered 2013-04-11 – 2013-04-12 (×2): 80 mg via INTRAVENOUS
  Filled 2013-04-11 (×3): qty 8

## 2013-04-11 MED ORDER — ONDANSETRON HCL 4 MG/2ML IJ SOLN: 4.0000 mg | Freq: Four times a day (QID) | INTRAMUSCULAR | Status: DC | PRN

## 2013-04-11 MED ORDER — MORPHINE SULFATE ER 30 MG PO TBCR
30.0000 mg | EXTENDED_RELEASE_TABLET | Freq: Two times a day (BID) | ORAL | Status: DC
  Administered 2013-04-11 – 2013-04-13 (×5): 30 mg via ORAL
  Filled 2013-04-11 (×5): qty 1

## 2013-04-11 MED ORDER — FUROSEMIDE 40 MG PO TABS: 40.0000 mg | ORAL_TABLET | Freq: Two times a day (BID) | ORAL | Status: DC

## 2013-04-11 MED ORDER — RISAQUAD PO CAPS
1.0000 | ORAL_CAPSULE | Freq: Every day | ORAL | Status: DC
  Administered 2013-04-11 – 2013-04-13 (×3): 1 via ORAL
  Filled 2013-04-11 (×3): qty 1

## 2013-04-11 MED ORDER — OMEGA-3-ACID ETHYL ESTERS 1 G PO CAPS
1.0000 g | ORAL_CAPSULE | Freq: Three times a day (TID) | ORAL | Status: DC
  Administered 2013-04-11 – 2013-04-13 (×6): 1 g via ORAL
  Filled 2013-04-11 (×6): qty 1

## 2013-04-11 MED ORDER — OXYCODONE HCL 5 MG PO TABS
10.0000 mg | ORAL_TABLET | Freq: Three times a day (TID) | ORAL | Status: DC | PRN
  Administered 2013-04-12 (×2): 10 mg via ORAL
  Filled 2013-04-11 (×2): qty 2

## 2013-04-11 MED ORDER — ASPIRIN EC 81 MG PO TBEC
81.0000 mg | DELAYED_RELEASE_TABLET | Freq: Every day | ORAL | Status: DC
  Administered 2013-04-11 – 2013-04-13 (×3): 81 mg via ORAL
  Filled 2013-04-11 (×3): qty 1

## 2013-04-11 MED ORDER — SPIRONOLACTONE 25 MG PO TABS
50.0000 mg | ORAL_TABLET | Freq: Every day | ORAL | Status: DC
  Administered 2013-04-11 – 2013-04-13 (×3): 50 mg via ORAL
  Filled 2013-04-11 (×3): qty 2

## 2013-04-11 MED ORDER — HEPARIN SODIUM (PORCINE) 5000 UNIT/ML IJ SOLN
5000.0000 [IU] | Freq: Three times a day (TID) | INTRAMUSCULAR | Status: DC
  Administered 2013-04-11 – 2013-04-13 (×6): 5000 [IU] via SUBCUTANEOUS
  Filled 2013-04-11 (×2): qty 1
  Filled 2013-04-11 (×2): qty 2

## 2013-04-11 MED ORDER — COLCHICINE 0.6 MG PO TABS
0.6000 mg | ORAL_TABLET | Freq: Two times a day (BID) | ORAL | Status: DC
  Administered 2013-04-11 – 2013-04-13 (×5): 0.6 mg via ORAL
  Filled 2013-04-11 (×5): qty 1

## 2013-04-11 MED ORDER — DICYCLOMINE HCL 10 MG PO CAPS: 10.0000 mg | ORAL_CAPSULE | Freq: Three times a day (TID) | ORAL | Status: DC | PRN

## 2013-04-11 NOTE — Progress Notes (Signed)
Subjective:    Patient ID: David [name], male    DOB: February 16, 1976, 37 y.o.   MRN: [id number]  HPI With patient brought back in for reevaluation. Overall had a fairly difficult night. Still low back pain. Still diminished energy. Felt maybe had some fever. Achy at times. Positive dysuria. Positive ongoing gross hematuria. I advised the patient I research stains in rhythm EKG is associated with rare retroperitoneal hemorrhage. Due to atypical presentation felt further workup warranted.   Review of Systems Slight nausea, no vomiting no chills. No new concerns. ROS otherwise negative.    Objective:   Physical Exam  Alert, mild malaise. Hydration fair. HEENT normal. Vitals reviewed. Lungs clear. Heart regular in rhythm. Low back tender to palpation. Abdomen large, ascites palpated. Numerous scars.  Results for orders placed in visit on 04/11/13  CBC WITH DIFFERENTIAL      Result Value Range   WBC 7.3  4.0 - 10.5 K/uL   RBC 3.37 (*) 4.22 - 5.81 MIL/uL   Hemoglobin 8.4 (*) 13.0 - 17.0 g/dL   HCT 91.4 (*) 78.2 - 95.6 %   MCV 76.6 (*) 78.0 - 100.0 fL   MCH 24.9 (*) 26.0 - 34.0 pg   MCHC 32.6  30.0 - 36.0 g/dL   RDW 21.3 (*) 08.6 - 57.8 %   Platelets 196  150 - 400 K/uL   Neutrophils Relative 78 (*) 43 - 77 %   Neutro Abs 5.7  1.7 - 7.7 K/uL   Lymphocytes Relative 11 (*) 12 - 46 %   Lymphs Abs 0.8  0.7 - 4.0 K/uL   Monocytes Relative 8  3 - 12 %   Monocytes Absolute 0.6  0.1 - 1.0 K/uL   Eosinophils Relative 1  0 - 5 %   Eosinophils Absolute 0.1  0.0 - 0.7 K/uL   Basophils Relative 0  0 - 1 %   Basophils Absolute 0.0  0.0 - 0.1 K/uL   Smear Review Criteria for review not met    BASIC METABOLIC PANEL      Result Value Range   Sodium 135  135 - 145 mEq/L   Potassium 4.5  3.5 - 5.3 mEq/L   Chloride 100  96 - 112 mEq/L   CO2 27  19 - 32 mEq/L   Glucose, Bld 141 (*) 70 - 99 mg/dL   BUN 24 (*) 6 - 23 mg/dL   Creat 4.69 (*) 6.29 - 1.35 mg/dL   Calcium 8.4  8.4 - 52.8 mg/dL  HEPATIC  FUNCTION PANEL      Result Value Range   Total Bilirubin 0.2 (*) 0.3 - 1.2 mg/dL   Bilirubin, Direct 0.1  0.0 - 0.3 mg/dL   Indirect Bilirubin 0.1  0.0 - 0.9 mg/dL   Alkaline Phosphatase 292 (*) 39 - 117 U/L   AST 40 (*) 0 - 37 U/L   ALT 19  0 - 53 U/L   Total Protein 6.9  6.0 - 8.3 g/dL   Albumin 2.2 (*) 3.5 - 5.2 g/dL  LIPASE      Result Value Range   Lipase 37  11 - 59 U/L  AMYLASE      Result Value Range   Amylase 44  0 - 105 U/L   10    Assessment & Plan:  Impression #1 gross hematuria complicated by severe back pain and Crohn's disease. Unable to do CT scan do to acute renal insufficiency. #2 acute renal insufficiency. Plan since the ER. I  spoke with ER Dr. Drucilla Schmidt to complicated nature recommended hospitalization and they will proceed with this. WSL

## 2013-04-11 NOTE — ED Provider Notes (Signed)
History  This chart was scribed for [name], MD by Shari Heritage, ED Scribe. The patient was seen in room APA12/APA12. Patient's care was started at 1249.   CSN: [id number]  Arrival date & time 04/11/13  1211   First MD Initiated Contact with Patient 04/11/13 1249      Chief Complaint  Patient presents with  . Abnormal Lab    Patient is a 37 y.o. male presenting with back pain. The history is provided by the patient. No language interpreter was used.  Back Pain Location:  Lumbar spine Radiates to:  Does not radiate Pain severity:  Moderate Onset quality:  Gradual Duration:  4 days Timing:  Constant Chronicity:  New Associated symptoms: no abdominal pain, no bladder incontinence, no bowel incontinence, no chest pain and no headaches      HPI Comments: David [name] is a 37 y.o. male with history of Chron's disease, fatty liver, hypertension, stomach ulcers and who presents to the Emergency Department complaining of moderate, constant bilateral lower back pain onset 3-4 days ago. Patient saw Dr. Gerda Diss yesterday complaining of a 3 das history of hematuria and back pain. Dr. Gerda Diss scheduled an abdominal CT scan for this morning, but cancelled after he reviewed patient's lab work. Patient has an elevated creatinine result. He was advised to check into the ED for further evaluation of symptoms and lab results. Patient denies any new symptoms at this time. Patient is a former smoker and does no use alcohol.   PCP - Merlyn Albert  Past Medical History  Diagnosis Date  . 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 Hepatomegaly 05/04/2011  . Fatty liver 05/04/2011    "stage III fatty liver fibrosis" (11/15/2012)  . GERD (gastroesophageal reflux disease)   . Chronic liver disease     /notes 11/15/2012  . Pericarditis     [name] 11/15/2012  . Hypertension   . Pneumonia 1977  . Shortness of  breath     "all the time right now" (11/15/2012)  . History of blood transfusion 2004  . History of stomach ulcers   . Duodenal ulcer   . Depression   . Kidney stones     bilaterally/notes 11/15/2012  . Hepatic fibrosis     [name] 11/15/2012  . Anemia   . Pericardial effusion 10/29/2012    moderate to large/notes 11/15/2012  . Anxiety   . Hepatitis   . Crohn's disease     Past Surgical History  Procedure Laterality Date  . Anal examination under anesthesia  02/11/2011    [name]  . Treatment fistula anal  07/03/11    This was a second surgery to repair Anal fistula  . Small intestine surgery  2004    "had hole cut in small intestines during endoscopy; had to have OR & leave me open 3 months" (11/15/2012)  . Cholecystectomy  ~ 2003  . Appendectomy  ~ 2003  . Video assisted thoracoscopy  11/17/2012    Procedure: VIDEO ASSISTED THORACOSCOPY;  Surgeon: Loreli Slot, MD;  Location: [facility] OR;  Service: Thoracic;  Laterality: Left;  drainage of left pleural effusion  . Pericardial window  11/17/2012    Procedure: PERICARDIAL WINDOW;  Surgeon: Loreli Slot, MD;  Location: [facility] OR;  Service: Thoracic;  Laterality: N/A;  . Esophagogastroduodenoscopy  01/06/2013    Procedure: ESOPHAGOGASTRODUODENOSCOPY (EGD);  Surgeon: Malissa Hippo, MD;  Location: [facility]  SU[facility];  Service: Endoscopy;  Laterality: N/A;  11:15    Family History  Problem Relation Age of Onset  . Diabetes Mother   . Diabetes Brother   . Healthy Daughter   . Healthy Son     History  Substance Use Topics  . Smoking status: Former Smoker -- 0.50 packs/day for 24 years    Types: Cigarettes    Start date: 10/28/2012    Quit date: 11/22/2012  . Smokeless tobacco: Current User    Types: Snuff     Comment: 11/15/2012 "quit smoking cigarettes ~ 1 month ago"  . Alcohol Use: No      Review of Systems  Constitutional: Negative for appetite change and fatigue.  HENT: Negative for congestion, sinus pressure and ear  discharge.   Eyes: Negative for discharge.  Respiratory: Negative for cough.   Cardiovascular: Negative for chest pain.  Gastrointestinal: Negative for abdominal pain, diarrhea and bowel incontinence.  Genitourinary: Positive for hematuria. Negative for bladder incontinence and frequency.  Musculoskeletal: Positive for back pain.  Skin: Negative for rash.  Neurological: Negative for seizures and headaches.  Psychiatric/Behavioral: Negative for hallucinations.    Allergies  Morphine and related  Home Medications   Current Outpatient Rx  Name  Route  Sig  Dispense  Refill  . albuterol (PROVENTIL HFA;VENTOLIN HFA) 108 (90 BASE) MCG/ACT inhaler   Inhalation   Inhale 2 puffs into the lungs every 6 (six) hours as needed. breathing         . aspirin 81 MG EC tablet   Oral   Take 1 tablet (81 mg total) by mouth daily.   30 tablet      . budesonide (ENTOCORT EC) 3 MG 24 hr capsule   Oral   Take 6 mg by mouth daily.          . ciprofloxacin (CIPRO) 500 MG tablet   Oral   Take 1 tablet (500 mg total) by mouth 2 (two) times daily.   42 tablet   0   . colchicine 0.6 MG tablet   Oral   Take 1 tablet (0.6 mg total) by mouth 2 (two) times daily.   60 tablet   1   . dicyclomine (BENTYL) 10 MG capsule   Oral   Take 1 capsule (10 mg total) by mouth 3 (three) times daily as needed.   90 capsule   5   . ferrous sulfate 325 (65 FE) MG tablet   Oral   Take 325 mg by mouth 2 (two) times daily.         . fish oil-omega-3 fatty acids 1000 MG capsule   Oral   Take 1 g by mouth 3 (three) times daily.         . furosemide (LASIX) 40 MG tablet   Oral   Take 40 mg by mouth 2 (two) times daily.          . Multiple Vitamins-Minerals (MULTIVITAMINS THER. W/MINERALS) TABS   Oral   Take 1 tablet by mouth 2 (two) times daily.          . ondansetron (ZOFRAN) 4 MG tablet   Oral   Take 1 tablet (4 mg total) by mouth every 8 (eight) hours as needed for nausea.   30 tablet    1   . Oxycodone HCl 10 MG TABS   Oral   Take 10 mg by mouth 3 (three) times daily as needed.         Marland Kitchen  oxymorphone (OPANA ER) 10 MG T12A 12 hr tablet   Oral   Take 10 mg by mouth every 12 (twelve) hours.         Marland Kitchen oxymorphone (OPANA ER, CRUSH RESISTANT,) 10 MG T12A 12 hr tablet   Oral   Take 1 tablet (10 mg total) by mouth every 12 (twelve) hours.   60 tablet   0   . pantoprazole (PROTONIX) 40 MG tablet   Oral   Take 1 tablet (40 mg total) by mouth daily.   30 tablet   4     30 minutes before breakfast   . potassium chloride (K-DUR) 10 MEQ tablet   Oral   Take 20 mEq by mouth daily.          . Probiotic Product (ALIGN) 4 MG CAPS   Oral   Take 4 mg by mouth daily.          Marland Kitchen spironolactone (ALDACTONE) 50 MG tablet   Oral   Take 50 mg by mouth daily.          . sucralfate (CARAFATE) 1 G tablet   Oral   Take 1 tablet (1 g total) by mouth 4 (four) times daily.   120 tablet   2   . ursodiol (ACTIGALL) 300 MG capsule   Oral   Take 2 capsules (600 mg total) by mouth 2 (two) times daily.   120 capsule   1     Triage Vitals: BP 140/89  Pulse 93  Temp(Src) 98.2 F (36.8 C) (Oral)  Resp 18  Ht 5\' 11"  (1.803 m)  Wt 221 lb (100.245 kg)  BMI 30.84 kg/m2  SpO2 100%  Physical Exam  Constitutional: He is oriented to person, place, and time. He appears well-developed.  HENT:  Head: Normocephalic.  Eyes: Conjunctivae and EOM are normal. No scleral icterus.  Neck: Neck supple. No thyromegaly present.  Cardiovascular: Normal rate and regular rhythm.  Exam reveals no gallop and no friction rub.   No murmur heard. Pulmonary/Chest: No stridor. He has no wheezes. He has no rales. He exhibits no tenderness.  Abdominal: He exhibits distension. There is generalized tenderness. There is no rebound.  Musculoskeletal: Normal range of motion. He exhibits no edema.  Lymphadenopathy:    He has no cervical adenopathy.  Neurological: He is oriented to person, place, and  time. Coordination normal.  Skin: No rash noted. No erythema.  Psychiatric: He has a normal mood and affect. His behavior is normal.    ED Course  Procedures (including critical care time) DIAGNOSTIC STUDIES: Oxygen Saturation is 100% on room air, normal by my interpretation.    COORDINATION OF CARE: 12:51 PM- Will order CT abdomen/pelvis wo contrast and UA. Patient informed of current plan for treatment and evaluation and agrees with plan at this time.      Labs Reviewed  URINALYSIS, ROUTINE W REFLEX MICROSCOPIC - Abnormal; Notable for the following:    Color, Urine ORANGE (*)    APPearance HAZY (*)    Hgb urine dipstick LARGE (*)    Protein, ur 100 (*)    All other components within normal limits  URINE MICROSCOPIC-ADD ON    Ct Abdomen Pelvis Wo Contrast  04/11/2013  *RADIOLOGY REPORT*  Clinical Data: 37 year old male with abdominal and pelvic pain. History of Crohn's disease, intestinal surgery and elevated creatinine.  CT ABDOMEN AND PELVIS WITHOUT CONTRAST  Technique:  Multidetector CT imaging of the abdomen and pelvis was performed following the standard protocol  without intravenous contrast.  Comparison: 09/27/2012 MRI and 08/21/2011 CT  Findings: Mild hepatomegaly is again identified. Splenomegaly has increased with a splenic volume of 3150 ml. No focal hepatic or splenic lesions are identified. The kidneys are unremarkable except for a punctate nonobstructing mid right renal calculus. The adrenal glands and pancreas are unremarkable. Please note that parenchymal abnormalities may be missed as intravenous contrast was not administered. The patient is status post cholecystectomy.  A moderate to large amount of ascites is identified primarily within the pelvis and lower left abdomen and appears to be loculated. Diffuse mesenteric and subcutaneous edema is noted.  There is no evidence of biliary dilatation, abdominal aortic aneurysm or definite enlarged lymph nodes. Surgical clips in  the region of the distal small bowel noted. There is no evidence of bowel obstruction or pneumoperitoneum. The bladder is within normal limits. No acute or suspicious bony abnormalities are identified.  IMPRESSION: Moderate to large amount of ascites within the pelvis and lower left abdomen - appears loculated.  Marked splenomegaly and mild hepatomegaly - this findings just of portal hypertension.  Diffuse subcutaneous and mesenteric edema.  Punctate nonobstructing mid right renal calculus.   Original Report Authenticated By: Harmon Pier, M.D.      No diagnosis found.    MDM   Hematuria,  Renal insuff.  Spoke with dr. Gerda Diss will have hoapitalist to see pt     The chart was scribed for me under my direct supervision.  I personally performed the history, physical, and medical decision making and all procedures in the evaluation of this patient.[name], MD 04/11/13 [phone]

## 2013-04-11 NOTE — ED Notes (Signed)
Pt sent to er from radiology for elevated kidney functions, pt was scheduled to have outpatient ct abd and pelvis performed for evaluation of blood in urine, lower back pain.

## 2013-04-11 NOTE — H&P (Addendum)
[facility] History and Physical  David [name] BJY:[id number] DOB: Apr 14, 1976 DOA: 04/11/2013  Referring physician: Dr. Agapito Games, ER physician. PCP: [name], MD  Specialists: Dr Karilyn Cota, gastroenterology.  Chief Complaint: Back pain, hematuria.  HPI: David [name] is a 37 y.o. male presents with back pain and hematuria for the last 2-3 days. He denies any vomiting. He apparently did have a fever a few days ago. He does not have any abdominal pain. He has a history of Crohn's disease and also of chronic liver disease. He does not have a previous history of renal failure. When he was seen in the emergency room, he was found to have a creatinine of 2.28 and a hemoglobin of 8.4, microcytic picture. A noncontrast CT scan of the abdomen was done which shows moderate large amount of ascites, marked splenomegaly and mild hepatomegaly. There is diffuse subcutaneous and mesenteric edema. There is a right mid renal calculus, which is not obstructing.   Review of Systems:   Apart from symptoms above, other systems negative.  Past Medical History  Diagnosis Date  . 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 Hepatomegaly 05/04/2011  . Fatty liver 05/04/2011    "stage III fatty liver fibrosis" (11/15/2012)  . GERD (gastroesophageal reflux disease)   . Chronic liver disease     /notes 11/15/2012  . Pericarditis     [name] 11/15/2012  . Hypertension   . Pneumonia 1977  . Shortness of breath     "all the time right now" (11/15/2012)  . History of blood transfusion 2004  . History of stomach ulcers   . Duodenal ulcer   . Depression   . Kidney stones     bilaterally/notes 11/15/2012  . Hepatic fibrosis     [name] 11/15/2012  . Anemia   . Pericardial effusion 10/29/2012    moderate to large/notes 11/15/2012  . Anxiety   . Hepatitis   . Crohn's disease    Past Surgical History  Procedure Laterality Date  .  Anal examination under anesthesia  02/11/2011    [name]  . Treatment fistula anal  07/03/11    This was a second surgery to repair Anal fistula  . Small intestine surgery  2004    "had hole cut in small intestines during endoscopy; had to have OR & leave me open 3 months" (11/15/2012)  . Cholecystectomy  ~ 2003  . Appendectomy  ~ 2003  . Video assisted thoracoscopy  11/17/2012    Procedure: VIDEO ASSISTED THORACOSCOPY;  Surgeon: Loreli Slot, MD;  Location: [facility] OR;  Service: Thoracic;  Laterality: Left;  drainage of left pleural effusion  . Pericardial window  11/17/2012    Procedure: PERICARDIAL WINDOW;  Surgeon: Loreli Slot, MD;  Location[facility] OR;  Service: Thoracic;  Laterality: N/A;  . Esophagogastroduodenoscopy  01/06/2013    Procedure: ESOPHAGOGASTRODUODENOSCOPY (EGD);  Surgeon: Malissa Hippo, MD;  Location: [facility];  Service: Endoscopy;  Laterality: N/A;  11:15   Social History:  He is married and lives with his wife. He does not smoke. Does not drink alcohol.  Allergies  Allergen Reactions  . Morphine And Related Hives    Family History  Problem Relation Age of Onset  . Diabetes Mother   . Diabetes Brother   . Healthy Daughter   . Healthy Son       Prior to Admission medications   Medication Sig Start Date  End Date Taking? Authorizing Provider  aspirin 81 MG EC tablet Take 1 tablet (81 mg total) by mouth daily. 11/22/12  Yes Wayne E Gold, PA-C  budesonide (ENTOCORT EC) 3 MG 24 hr capsule Take 6 mg by mouth daily.  11/30/12  Yes Historical Provider, MD  colchicine 0.6 MG tablet Take 1 tablet (0.6 mg total) by mouth 2 (two) times daily. 11/22/12  Yes Wayne E Gold, PA-C  dicyclomine (BENTYL) 10 MG capsule Take 1 capsule (10 mg total) by mouth 3 (three) times daily as needed. 03/27/13  Yes Malissa Hippo, MD  ferrous sulfate 325 (65 FE) MG tablet Take 325 mg by mouth 2 (two) times daily. 11/22/12  Yes Wayne E Gold, PA-C  fish oil-omega-3 fatty acids 1000  MG capsule Take 1 g by mouth 3 (three) times daily. 02/09/12  Yes Malissa Hippo, MD  furosemide (LASIX) 40 MG tablet Take 40 mg by mouth 2 (two) times daily.  11/22/12  Yes Wayne E Gold, PA-C  Multiple Vitamins-Minerals (MULTIVITAMINS THER. W/MINERALS) TABS Take 1 tablet by mouth 2 (two) times daily.    Yes Historical Provider, MD  ondansetron (ZOFRAN) 4 MG tablet Take 1 tablet (4 mg total) by mouth every 8 (eight) hours as needed for nausea. 02/27/13  Yes Malissa Hippo, MD  Oxycodone HCl 10 MG TABS Take 10 mg by mouth 3 (three) times daily as needed. 03/22/13  Yes Len Blalock, NP  oxymorphone (OPANA ER) 10 MG T12A 12 hr tablet Take 10 mg by mouth every 12 (twelve) hours. 02/21/13  Yes Malissa Hippo, MD  pantoprazole (PROTONIX) 40 MG tablet Take 1 tablet (40 mg total) by mouth daily. 08/03/12  Yes Len Blalock, NP  potassium chloride (K-DUR) 10 MEQ tablet Take 20 mEq by mouth daily.  11/02/12 11/02/13 Yes Gaylord Shih, MD  Probiotic Product (ALIGN) 4 MG CAPS Take 4 mg by mouth daily.    Yes Historical Provider, MD  spironolactone (ALDACTONE) 50 MG tablet Take 50 mg by mouth daily.  10/31/12  Yes Malissa Hippo, MD  sucralfate (CARAFATE) 1 G tablet Take 1 tablet (1 g total) by mouth 4 (four) times daily. 01/06/13  Yes Malissa Hippo, MD  ursodiol (ACTIGALL) 300 MG capsule Take 2 capsules (600 mg total) by mouth 2 (two) times daily. 11/22/12  Yes Rowe Clack, PA-C   Physical Exam: Filed Vitals:   04/11/13 1233  BP: 140/89  Pulse: 93  Temp: 98.2 F (36.8 C)  TempSrc: Oral  Resp: 18  Height: 5\' 11"  (1.803 m)  Weight: 100.245 kg (221 lb)  SpO2: 100%     General:  He looks chronically sick. He has significant peripheral pitting edema in his legs up to his knees.  Eyes: No significant pallor in his conjunctiva. No jaundice.  ENT: No abnormalities.  Neck: No lymphadenopathy.  Cardiovascular: Heart sounds are present and normal without murmurs or added sounds. Jugular venous  pressure not raised.  Respiratory: Lung fields are clear.  Abdomen: Distended abdomen, clinical ascites. Splenomegaly. No tenderness.  Skin: No rash.  Musculoskeletal: No acute joint abnormalities. Tender in the mid and lower back bilaterally, paraspinal areas.  Psychiatric: Appropriate affect.  Neurologic: Alert and orientated without any focal neurological signs.  Labs on Admission:  Basic Metabolic Panel:  Recent Labs Lab 04/11/13 1033  NA 135  K 4.5  CL 100  CO2 27  GLUCOSE 141*  BUN 24*  CREATININE 2.28*  CALCIUM 8.4   Liver  Function Tests:  Recent Labs Lab 04/11/13 1033  AST 40*  ALT 19  ALKPHOS 292*  BILITOT 0.2*  PROT 6.9  ALBUMIN 2.2*    Recent Labs Lab 04/11/13 1033  LIPASE 37  AMYLASE 44    CBC:  Recent Labs Lab 04/11/13 1033  WBC 7.3  NEUTROABS 5.7  HGB 8.4*  HCT 25.8*  MCV 76.6*  PLT 196      BNP (last 3 results)  Recent Labs  11/15/12 1845  PROBNP 1454.0*      Radiological Exams on Admission: Ct Abdomen Pelvis Wo Contrast  04/11/2013  *RADIOLOGY REPORT*  Clinical Data: 37 year old male with abdominal and pelvic pain. History of Crohn's disease, intestinal surgery and elevated creatinine.  CT ABDOMEN AND PELVIS WITHOUT CONTRAST  Technique:  Multidetector CT imaging of the abdomen and pelvis was performed following the standard protocol without intravenous contrast.  Comparison: 09/27/2012 MRI and 08/21/2011 CT  Findings: Mild hepatomegaly is again identified. Splenomegaly has increased with a splenic volume of 3150 ml. No focal hepatic or splenic lesions are identified. The kidneys are unremarkable except for a punctate nonobstructing mid right renal calculus. The adrenal glands and pancreas are unremarkable. Please note that parenchymal abnormalities may be missed as intravenous contrast was not administered. The patient is status post cholecystectomy.  A moderate to large amount of ascites is identified primarily within the  pelvis and lower left abdomen and appears to be loculated. Diffuse mesenteric and subcutaneous edema is noted.  There is no evidence of biliary dilatation, abdominal aortic aneurysm or definite enlarged lymph nodes. Surgical clips in the region of the distal small bowel noted. There is no evidence of bowel obstruction or pneumoperitoneum. The bladder is within normal limits. No acute or suspicious bony abnormalities are identified.  IMPRESSION: Moderate to large amount of ascites within the pelvis and lower left abdomen - appears loculated.  Marked splenomegaly and mild hepatomegaly - this findings just of portal hypertension.  Diffuse subcutaneous and mesenteric edema.  Punctate nonobstructing mid right renal calculus.   Original Report Authenticated By: Harmon Pier, M.D.       Assessment/Plan   1. Acute renal failure, possibly chronic kidney disease underlying. Fluid overload. 2. Microcytic anemia. 3. Crohn's disease with multiple complications in the past. 4. Chronic liver disease, unclear etiology,? Related to Crohn's disease. 5. Hypertension. 6. Previous history of pericardial effusion, status post pericardial window.  Plan: 1. Admit to medical floor. 2. Intravenous Lasix . 3. A nephrology consultation. 4. Gastroenterology consultation. 5. Check anemia panel. Check INR. Further recommendations will depend on patient's hospital progress.  Code Status: Full code.  Family Communication: Discussed plan with patient at the bedside.   Disposition Plan: Home when medically stable.  Time spent: 45 minutes.  Wilson Singer [facility] Pager [phone].  If 7PM-7AM, please contact night-coverage [email or web address] Password [facility] 04/11/2013, 3:23 PM

## 2013-04-11 NOTE — ED Notes (Signed)
Pt went to radiology for CT scan this morning but was unable to have scan due to elevated creatinine level. Pt was going to have CT because of lower back pain and hematuria. Pt denies any new symptoms at this time.

## 2013-04-12 ENCOUNTER — Encounter: Payer: Self-pay | Admitting: *Deleted

## 2013-04-12 ENCOUNTER — Ambulatory Visit: Payer: Medicare Other | Admitting: Family Medicine

## 2013-04-12 ENCOUNTER — Inpatient Hospital Stay (HOSPITAL_COMMUNITY): Payer: Medicare Other

## 2013-04-12 DIAGNOSIS — E8779 Other fluid overload: Secondary | ICD-10-CM

## 2013-04-12 DIAGNOSIS — K5090 Crohn's disease, unspecified, without complications: Secondary | ICD-10-CM

## 2013-04-12 DIAGNOSIS — N289 Disorder of kidney and ureter, unspecified: Secondary | ICD-10-CM

## 2013-04-12 DIAGNOSIS — R748 Abnormal levels of other serum enzymes: Secondary | ICD-10-CM

## 2013-04-12 DIAGNOSIS — K7689 Other specified diseases of liver: Secondary | ICD-10-CM

## 2013-04-12 DIAGNOSIS — I517 Cardiomegaly: Secondary | ICD-10-CM

## 2013-04-12 DIAGNOSIS — R188 Other ascites: Secondary | ICD-10-CM

## 2013-04-12 LAB — BODY FLUID CELL COUNT WITH DIFFERENTIAL
Monocyte-Macrophage-Serous Fluid: 16 % — ABNORMAL LOW (ref 50–90)
Total Nucleated Cell Count, Fluid: 283 cu mm (ref 0–1000)

## 2013-04-12 LAB — GLUCOSE, SEROUS FLUID

## 2013-04-12 LAB — HEMOGLOBIN A1C
Hgb A1c MFr Bld: 5.2 % (ref <5.7)
Mean Plasma Glucose: 103 mg/dL (ref <117)

## 2013-04-12 LAB — FOLATE: Folate: 15.8 ng/mL

## 2013-04-12 LAB — CBC
MCH: 24.8 pg — ABNORMAL LOW (ref 26.0–34.0)
MCHC: 32.8 g/dL (ref 30.0–36.0)
MCV: 75.5 fL — ABNORMAL LOW (ref 78.0–100.0)
Platelets: 191 10*3/uL (ref 150–400)
RDW: 19.8 % — ABNORMAL HIGH (ref 11.5–15.5)

## 2013-04-12 LAB — VITAMIN B12: Vitamin B-12: 927 pg/mL — ABNORMAL HIGH (ref 211–911)

## 2013-04-12 LAB — C3 COMPLEMENT: C3 Complement: 136 mg/dL (ref 90–180)

## 2013-04-12 LAB — COMPREHENSIVE METABOLIC PANEL
ALT: 17 U/L (ref 0–53)
AST: 37 U/L (ref 0–37)
Albumin: 2.2 g/dL — ABNORMAL LOW (ref 3.5–5.2)
Alkaline Phosphatase: 294 U/L — ABNORMAL HIGH (ref 39–117)
CO2: 27 mEq/L (ref 19–32)
Chloride: 100 mEq/L (ref 96–112)
Creatinine, Ser: 2.39 mg/dL — ABNORMAL HIGH (ref 0.50–1.35)
GFR calc non Af Amer: 33 mL/min — ABNORMAL LOW (ref >90)
Potassium: 5.1 mEq/L (ref 3.5–5.1)
Sodium: 134 mEq/L — ABNORMAL LOW (ref 135–145)
Total Bilirubin: 0.3 mg/dL (ref 0.3–1.2)

## 2013-04-12 LAB — PROTEIN, BODY FLUID: Total protein, fluid: 2.1 g/dL

## 2013-04-12 LAB — IRON AND TIBC
Iron: 12 ug/dL — ABNORMAL LOW (ref 42–135)
UIBC: 253 ug/dL (ref 125–400)

## 2013-04-12 LAB — C4 COMPLEMENT: Complement C4, Body Fluid: 25 mg/dL (ref 10–40)

## 2013-04-12 LAB — PROTEIN, URINE, RANDOM: Total Protein, Urine: 12 mg/dL

## 2013-04-12 LAB — PROTIME-INR: Prothrombin Time: 14.6 seconds (ref 11.6–15.2)

## 2013-04-12 LAB — CK: Total CK: 37 U/L (ref 7–232)

## 2013-04-12 LAB — SEDIMENTATION RATE: Sed Rate: 80 mm/hr — ABNORMAL HIGH (ref 0–16)

## 2013-04-12 LAB — CREATININE, URINE, RANDOM: Creatinine, Urine: 31.49 mg/dL

## 2013-04-12 MED ORDER — ALBUMIN HUMAN 25 % IV SOLN
50.0000 g | Freq: Once | INTRAVENOUS | Status: AC
  Administered 2013-04-12: 50 g via INTRAVENOUS
  Filled 2013-04-12: qty 200

## 2013-04-12 MED ORDER — FUROSEMIDE 10 MG/ML IJ SOLN
40.0000 mg | Freq: Two times a day (BID) | INTRAMUSCULAR | Status: DC
  Administered 2013-04-12 – 2013-04-13 (×3): 40 mg via INTRAVENOUS
  Filled 2013-04-12 (×3): qty 4

## 2013-04-12 MED ORDER — SODIUM CHLORIDE 0.9 % IV SOLN
INTRAVENOUS | Status: DC
  Administered 2013-04-12 – 2013-04-13 (×2): via INTRAVENOUS

## 2013-04-12 MED ORDER — FERROUS SULFATE 325 (65 FE) MG PO TABS: 325.0000 mg | ORAL_TABLET | Freq: Two times a day (BID) | ORAL | Status: DC

## 2013-04-12 NOTE — Procedures (Signed)
Procedure:  Ultrasound guided paracentesis Findings:  Diagnostic paracentesis performed via LLQ.  180 mL of clear fluid removed via 5 Fr Yueh centesis catheter.  Sample sent for requested lab tests. No complications.

## 2013-04-12 NOTE — Consult Note (Signed)
Dictation [id number]

## 2013-04-12 NOTE — Progress Notes (Signed)
Subjective: This man is feeling much improved and his back pain is virtually resolved now. He has lost significant weight overnight and has had a good diuresis.           Physical Exam: Blood pressure 123/75, pulse 85, temperature 97.5 F (36.4 C), temperature source Oral, resp. rate 16, height 5\' 11"  (1.803 m), weight 91.4 kg (201 lb 8 oz), SpO2 100.00%. He looks chronically sick still. His peripheral pitting edema in his legs is virtually disappeared!. Heart sounds are present and normal. Lung fields are clear. Abdomen still distended with clinical ascites. He is alert and orientated without any focal neurological signs. There is certainly no evidence of hepatic encephalopathy clinically .   Investigations:  No results found for this or any previous visit (from the past 240 hour(s)).   Basic Metabolic Panel:  Recent Labs  52/84/13 1033 04/11/13 1608 04/12/13 0454  NA 135  --  134*  K 4.5  --  5.1  CL 100  --  100  CO2 27  --  27  GLUCOSE 141*  --  111*  BUN 24*  --  25*  CREATININE 2.28* 2.26* 2.39*  CALCIUM 8.4  --  8.5   Liver Function Tests:  Recent Labs  04/11/13 1033 04/12/13 0454  AST 40* 37  ALT 19 17  ALKPHOS 292* 294*  BILITOT 0.2* 0.3  PROT 6.9 6.9  ALBUMIN 2.2* 2.2*     CBC:  Recent Labs  04/11/13 1033 04/11/13 1608 04/12/13 0454  WBC 7.3 5.7 6.6  NEUTROABS 5.7  --   --   HGB 8.4* 7.9* 8.3*  HCT 25.8* 24.1* 25.3*  MCV 76.6* 75.8* 75.5*  PLT 196 185 191    Ct Abdomen Pelvis Wo Contrast  04/11/2013  *RADIOLOGY REPORT*  Clinical Data: 37 year old male with abdominal and pelvic pain. History of Crohn's disease, intestinal surgery and elevated creatinine.  CT ABDOMEN AND PELVIS WITHOUT CONTRAST  Technique:  Multidetector CT imaging of the abdomen and pelvis was performed following the standard protocol without intravenous contrast.  Comparison: 09/27/2012 MRI and 08/21/2011 CT  Findings: Mild hepatomegaly is again identified.  Splenomegaly has increased with a splenic volume of 3150 ml. No focal hepatic or splenic lesions are identified. The kidneys are unremarkable except for a punctate nonobstructing mid right renal calculus. The adrenal glands and pancreas are unremarkable. Please note that parenchymal abnormalities may be missed as intravenous contrast was not administered. The patient is status post cholecystectomy.  A moderate to large amount of ascites is identified primarily within the pelvis and lower left abdomen and appears to be loculated. Diffuse mesenteric and subcutaneous edema is noted.  There is no evidence of biliary dilatation, abdominal aortic aneurysm or definite enlarged lymph nodes. Surgical clips in the region of the distal small bowel noted. There is no evidence of bowel obstruction or pneumoperitoneum. The bladder is within normal limits. No acute or suspicious bony abnormalities are identified.  IMPRESSION: Moderate to large amount of ascites within the pelvis and lower left abdomen - appears loculated.  Marked splenomegaly and mild hepatomegaly - this findings just of portal hypertension.  Diffuse subcutaneous and mesenteric edema.  Punctate nonobstructing mid right renal calculus.   Original Report Authenticated By: Harmon Pier, M.D.       Medications: I have reviewed the patient's current medications.  Impression: 1. Acute renal failure, probable chronic kidney disease underlying. Fluid overload, improving. 2. Microcytic anemia, studies seem to indicate iron deficiency. 3. Crohn's  disease with multiple complications in the past. 4. Chronic liver disease, possibly related to Crohn's disease. 5. Hypertension. 6. Previous history of pericardial effusion, status post pericardial window.     Plan: 1. Reduce IV Lasix to 40 mg twice a day. 2. Await further recommendations from nephrology and gastroenterology.     LOS: 1 day   Wilson Singer Pager [phone]  04/12/2013, 8:39 AM

## 2013-04-12 NOTE — Consult Note (Signed)
Reason for Consult: Referring Physician: Hospitalist  David [name] is an 37 y.o. male.  HPI: Admitted last night thru the ED yesterday. Saw Dr. Gerda Diss yesterday for hematuria. Sent to the ED for further evaluation. This past weekend he ran a fever of 103. He back ws hurting. His urine was pink in color. He felt terrible.  He is presently taking Remicade for Crohn's. Last tx was 5 weeks ago. His wife tells me he was at [facility] in December for Pericarditis. He underwent a pericardiocentesis and felt better.   He is having 1-3 stools a day. Brown in color. Semi-formed.  There was no nausea or vomiting.  No abdominal pain. His abdomen is distended. NO SOB. He tells me he feels better.  Noted his BUN and creatinine elevated. Liver biopsy in 2012 revealed steatohepatitis.  Past Medical History  Diagnosis Date  . 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 Hepatomegaly 05/04/2011  . Fatty liver 05/04/2011    "stage III fatty liver fibrosis" (11/15/2012)  . GERD (gastroesophageal reflux disease)   . Chronic liver disease     /notes 11/15/2012  . Pericarditis     [name] 11/15/2012  . Hypertension   . Pneumonia 1977  . Shortness of breath     "all the time right now" (11/15/2012)  . History of blood transfusion 2004  . History of stomach ulcers   . Duodenal ulcer   . Depression   . Kidney stones     bilaterally/notes 11/15/2012  . Hepatic fibrosis     [name] 11/15/2012  . Anemia   . Pericardial effusion 10/29/2012    moderate to large/notes 11/15/2012  . Anxiety   . Hepatitis   . Crohn's disease     Past Surgical History  Procedure Laterality Date  . Anal examination under anesthesia  02/11/2011    [name]  . Treatment fistula anal  07/03/11    This was a second surgery to repair Anal fistula  . Small intestine surgery  2004    "had hole cut in small intestines during endoscopy; had to have OR & leave me  open 3 months" (11/15/2012)  . Cholecystectomy  ~ 2003  . Appendectomy  ~ 2003  . Video assisted thoracoscopy  11/17/2012    Procedure: VIDEO ASSISTED THORACOSCOPY;  Surgeon: Loreli Slot, MD;  Location: [facility] OR;  Service: Thoracic;  Laterality: Left;  drainage of left pleural effusion  . Pericardial window  11/17/2012    Procedure: PERICARDIAL WINDOW;  Surgeon: Loreli Slot, MD;  Location: [facility] OR;  Service: Thoracic;  Laterality: N/A;  . Esophagogastroduodenoscopy  01/06/2013    Procedure: ESOPHAGOGASTRODUODENOSCOPY (EGD);  Surgeon: Malissa Hippo, MD;  Location: [facility];  Service: Endoscopy;  Laterality: N/A;  11:15    Family History  Problem Relation Age of Onset  . Diabetes Mother   . Diabetes Brother   . Healthy Daughter   . Healthy Son     Social History:  reports that he quit smoking about 4 months ago. His smoking use included Cigarettes. He started smoking about 5 months ago. He has a 12 pack-year smoking history. His smokeless tobacco use includes Snuff. He reports that he does not drink alcohol or use illicit drugs.  Allergies:  Allergies  Allergen Reactions  . Morphine And Related Hives    Medications: I have reviewed the patient's current medications.  Results for orders placed during the  hospital encounter of 04/11/13 (from the past 48 hour(s))  URINALYSIS, ROUTINE W REFLEX MICROSCOPIC     Status: Abnormal   Collection Time    04/11/13  1:00 PM      Result Value Range   Color, Urine ORANGE (*) YELLOW   Comment: BIOCHEMICALS MAY BE AFFECTED BY COLOR   APPearance HAZY (*) CLEAR   Specific Gravity, Urine 1.015  1.005 - 1.030   pH 7.5  5.0 - 8.0   Glucose, UA NEGATIVE  NEGATIVE mg/dL   Hgb urine dipstick LARGE (*) NEGATIVE   Bilirubin Urine NEGATIVE  NEGATIVE   Ketones, ur NEGATIVE  NEGATIVE mg/dL   Protein, ur 536 (*) NEGATIVE mg/dL   Urobilinogen, UA 0.2  0.0 - 1.0 mg/dL   Nitrite NEGATIVE  NEGATIVE   Leukocytes, UA NEGATIVE  NEGATIVE  URINE  MICROSCOPIC-ADD ON     Status: None   Collection Time    04/11/13  1:00 PM      Result Value Range   WBC, UA 0-2  <3 WBC/hpf   RBC / HPF TOO NUMEROUS TO COUNT  <3 RBC/hpf  CBC     Status: Abnormal   Collection Time    04/11/13  4:08 PM      Result Value Range   WBC 5.7  4.0 - 10.5 K/uL   RBC 3.18 (*) 4.22 - 5.81 MIL/uL   Hemoglobin 7.9 (*) 13.0 - 17.0 g/dL   HCT 64.4 (*) 03.4 - 74.2 %   MCV 75.8 (*) 78.0 - 100.0 fL   MCH 24.8 (*) 26.0 - 34.0 pg   MCHC 32.8  30.0 - 36.0 g/dL   RDW 59.5 (*) 63.8 - 75.6 %   Platelets 185  150 - 400 K/uL  CREATININE, SERUM     Status: Abnormal   Collection Time    04/11/13  4:08 PM      Result Value Range   Creatinine, Ser 2.26 (*) 0.50 - 1.35 mg/dL   GFR calc non Af Amer 36 (*) >90 mL/min   GFR calc Af Amer 41 (*) >90 mL/min   Comment:            The eGFR has been calculated     using the CKD EPI equation.     This calculation has not been     validated in all clinical     situations.     eGFR's persistently     <90 mL/min signify     possible Chronic Kidney Disease.  VITAMIN B12     Status: Abnormal   Collection Time    04/11/13  4:08 PM      Result Value Range   Vitamin B-12 927 (*) 211 - 911 pg/mL  FOLATE     Status: None   Collection Time    04/11/13  4:08 PM      Result Value Range   Folate 15.8     Comment: (NOTE)     Reference Ranges            Deficient:       0.4 - 3.3 ng/mL            Indeterminate:   3.4 - 5.4 ng/mL            Normal:              > 5.4 ng/mL  IRON AND TIBC     Status: Abnormal   Collection Time    04/11/13  4:08 PM  Result Value Range   Iron 12 (*) 42 - 135 ug/dL   TIBC 161  096 - 045 ug/dL   Saturation Ratios 5 (*) 20 - 55 %   UIBC 253  125 - 400 ug/dL  FERRITIN     Status: None   Collection Time    04/11/13  4:08 PM      Result Value Range   Ferritin 33  22 - 322 ng/mL  RETICULOCYTES     Status: Abnormal   Collection Time    04/11/13  4:08 PM      Result Value Range   Retic Ct Pct 0.8   0.4 - 3.1 %   RBC. 3.18 (*) 4.22 - 5.81 MIL/uL   Retic Count, Manual 25.4  19.0 - 186.0 K/uL  COMPREHENSIVE METABOLIC PANEL     Status: Abnormal   Collection Time    04/12/13  4:54 AM      Result Value Range   Sodium 134 (*) 135 - 145 mEq/L   Potassium 5.1  3.5 - 5.1 mEq/L   Chloride 100  96 - 112 mEq/L   CO2 27  19 - 32 mEq/L   Glucose, Bld 111 (*) 70 - 99 mg/dL   BUN 25 (*) 6 - 23 mg/dL   Creatinine, Ser 4.09 (*) 0.50 - 1.35 mg/dL   Calcium 8.5  8.4 - 81.1 mg/dL   Total Protein 6.9  6.0 - 8.3 g/dL   Albumin 2.2 (*) 3.5 - 5.2 g/dL   AST 37  0 - 37 U/L   ALT 17  0 - 53 U/L   Alkaline Phosphatase 294 (*) 39 - 117 U/L   Total Bilirubin 0.3  0.3 - 1.2 mg/dL   GFR calc non Af Amer 33 (*) >90 mL/min   GFR calc Af Amer 39 (*) >90 mL/min   Comment:            The eGFR has been calculated     using the CKD EPI equation.     This calculation has not been     validated in all clinical     situations.     eGFR's persistently     <90 mL/min signify     possible Chronic Kidney Disease.  CBC     Status: Abnormal   Collection Time    04/12/13  4:54 AM      Result Value Range   WBC 6.6  4.0 - 10.5 K/uL   RBC 3.35 (*) 4.22 - 5.81 MIL/uL   Hemoglobin 8.3 (*) 13.0 - 17.0 g/dL   HCT 91.4 (*) 78.2 - 95.6 %   MCV 75.5 (*) 78.0 - 100.0 fL   MCH 24.8 (*) 26.0 - 34.0 pg   MCHC 32.8  30.0 - 36.0 g/dL   RDW 21.3 (*) 08.6 - 57.8 %   Platelets 191  150 - 400 K/uL  PROTIME-INR     Status: None   Collection Time    04/12/13  4:54 AM      Result Value Range   Prothrombin Time 14.6  11.6 - 15.2 seconds   INR 1.16  0.00 - 1.49    Ct Abdomen Pelvis Wo Contrast  04/11/2013  *RADIOLOGY REPORT*  Clinical Data: 37 year old male with abdominal and pelvic pain. History of Crohn's disease, intestinal surgery and elevated creatinine.  CT ABDOMEN AND PELVIS WITHOUT CONTRAST  Technique:  Multidetector CT imaging of the abdomen and pelvis was performed following the standard protocol without intravenous  contrast.  Comparison: 09/27/2012 MRI and 08/21/2011 CT  Findings: Mild hepatomegaly is again identified. Splenomegaly has increased with a splenic volume of 3150 ml. No focal hepatic or splenic lesions are identified. The kidneys are unremarkable except for a punctate nonobstructing mid right renal calculus. The adrenal glands and pancreas are unremarkable. Please note that parenchymal abnormalities may be missed as intravenous contrast was not administered. The patient is status post cholecystectomy.  A moderate to large amount of ascites is identified primarily within the pelvis and lower left abdomen and appears to be loculated. Diffuse mesenteric and subcutaneous edema is noted.  There is no evidence of biliary dilatation, abdominal aortic aneurysm or definite enlarged lymph nodes. Surgical clips in the region of the distal small bowel noted. There is no evidence of bowel obstruction or pneumoperitoneum. The bladder is within normal limits. No acute or suspicious bony abnormalities are identified.  IMPRESSION: Moderate to large amount of ascites within the pelvis and lower left abdomen - appears loculated.  Marked splenomegaly and mild hepatomegaly - this findings just of portal hypertension.  Diffuse subcutaneous and mesenteric edema.  Punctate nonobstructing mid right renal calculus.   Original Report Authenticated By: Harmon Pier, M.D.     ROS Blood pressure 123/75, pulse 85, temperature 97.5 F (36.4 C), temperature source Oral, resp. rate 16, height 5\' 11"  (1.803 m), weight 201 lb 8 oz (91.4 kg), SpO2 100.00%. Physical Exam Alert and oriented. Skin warm and dry. Oral mucosa is moist.   . Sclera anicteric, conjunctivae is pink. Thyroid not enlarged. No cervical lymphadenopathy. Lungs clear. Heart regular rate and rhythm.  Abdomen is soft. Bowel sounds are positive. Unable to palpate liver. Abdomen is distended and slightly tense.  No abdominal masses felt. No tenderness.  No edema to lower extremities.     Assessment/Plan:  Known hx of liver disease. Ascites. I will discuss with Dr Siri Cole 04/12/2013, 8:30 AM     GI attending note; Patient interviewed and examined. He is well known to me from ongoing evaluation and management for Crohn's disease and cirrhosis. He was admitted yesterday with elevated serum creatinine which is new. He apparently had gross hematuria for which he was evaluated by Dr.Steve Luking and found to have elevated serum creatinine. Patient tells me that he had temp of 103 on Saturday. He has developed new onset of ascites. He therefore needs to undergo paracentesis he is primarily for diagnostic purposes. Suspect he may have prerenal azotemia. Dr. Wolfgang Phoenix has evaluated the patient and trying to sort this out. Recommendations; Ultrasound-guided abdominal paracentesis later today. Fluid to be sent for routine studies as well as AFB smear and culture and cytology. Will give patient 50 g of albumin IV post abdominal tap. Please note Dr. Jena Gauss will be assisting you with GI issues tomorrow.

## 2013-04-12 NOTE — Care Management Note (Signed)
    Page 1 of 1   04/13/2013     4:38:10 PM   CARE MANAGEMENT NOTE 04/13/2013  Patient:  David [name], David [name]   Account Number:  [id number]  Date Initiated:  04/12/2013  Documentation initiated by:  Anibal Henderson  Subjective/Objective Assessment:   Pt is from home, is independent, lives with spouse. No needs identified. He has Crohns' and other health issues related to this, with RF at present. On IVF, and lasix.     Action/Plan:   Will follow for possible needs at D/C   Anticipated DC Date:  04/14/2013   Anticipated DC Plan:  HOME/SELF CARE      DC Planning Services  CM consult      Choice offered to / List presented to:             Status of service:  Completed, signed off Medicare Important Message given?   (If response is "NO", the following Medicare IM given date fields will be blank) Date Medicare IM given:   Date Additional Medicare IM given:    Discharge Disposition:    Per UR Regulation:  Reviewed for med. necessity/level of care/duration of stay  If discussed at Long Length of Stay Meetings, dates discussed:    Comments:  04/13/13 1600 Anibal Henderson RN D/C home- No needs 04/12/13 1545 Anibal Henderson RN/CM

## 2013-04-12 NOTE — Progress Notes (Signed)
04/12/13 1518 Patient to have ultrasound guided paracentesis this afternoon. Hold afternoon heparin dose per Dr Karilyn Cota. Earnstine Regal, RN

## 2013-04-12 NOTE — Progress Notes (Signed)
Lidocaine 1%             4mL injected                        abdominal fluid removed

## 2013-04-12 NOTE — Progress Notes (Signed)
04/12/13 1531 Patient educated on 24 hour urine collection, instructed not to discard any urine output. Stated understood and would notify nursing staff when urine ready for collection. Earnstine Regal, RN

## 2013-04-12 NOTE — Progress Notes (Signed)
*  PRELIMINARY RESULTS* Echocardiogram 2D Echocardiogram has been performed.  Conrad Eagarville 04/12/2013, 11:25 AM

## 2013-04-12 NOTE — Progress Notes (Signed)
UR Chart Review Completed  

## 2013-04-12 NOTE — Consult Note (Signed)
David [name] MRN: [id number] DOB/AGE: 37-Feb-1977 36 y.o. Primary Care Physician:LUKING,W S, MD Admit date: 04/11/2013 Chief Complaint:  Chief Complaint  Patient presents with  . Abnormal Lab   HPI: Pt is 37 Year old male with past medical History of Crohns disease who presented to ER with chief complaint of Hematuria History of Present illness dates back to 1 week ago when pt started having hematuria. It was constant,progresive so pt went to Pcp from where he was asked to go to ER.  In Er pt was found to have Acute Kidney injury. Pt also c/o back pain.mostly in right side. Pt also was  C/o fevers. NO c/o sorethroat. NO c/o chest pain Pt c/o ascitis, [pericardial fluid and edema since last august.  no c/o Dyspnea C/o chills   Past Medical History  Diagnosis Date  . 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 Hepatomegaly 05/04/2011  . Fatty liver 05/04/2011    "stage III fatty liver fibrosis" (11/15/2012)  . GERD (gastroesophageal reflux disease)   . Chronic liver disease     /notes 11/15/2012  . Pericarditis     [name] 11/15/2012  . Hypertension   . Pneumonia 1977  . Shortness of breath     "all the time right now" (11/15/2012)  . History of blood transfusion 2004  . History of stomach ulcers   . Duodenal ulcer   . Depression   . Kidney stones     bilaterally/notes 11/15/2012  . Hepatic fibrosis     [name] 11/15/2012  . Anemia   . Pericardial effusion 10/29/2012    moderate to large/notes 11/15/2012  . Anxiety   . Hepatitis   . Crohn's disease         Family History  Problem Relation Age of Onset  . Diabetes Mother   . Diabetes Brother   . Healthy Daughter   . Healthy Son     Social History:  reports that he quit smoking about 4 months ago. His smoking use included Cigarettes. He started smoking about 5 months ago. He has a 12 pack-year smoking history. His smokeless tobacco  use includes Snuff. He reports that he does not drink alcohol or use illicit drugs. He later admitted to occasional marijuana use.   Allergies:  Allergies  Allergen Reactions  . Morphine And Related Hives    Medications Prior to Admission  Medication Sig Dispense Refill  . aspirin 81 MG EC tablet Take 1 tablet (81 mg total) by mouth daily.  30 tablet    . budesonide (ENTOCORT EC) 3 MG 24 hr capsule Take 6 mg by mouth daily.       . colchicine 0.6 MG tablet Take 1 tablet (0.6 mg total) by mouth 2 (two) times daily.  60 tablet  1  . dicyclomine (BENTYL) 10 MG capsule Take 1 capsule (10 mg total) by mouth 3 (three) times daily as needed.  90 capsule  5  . ferrous sulfate 325 (65 FE) MG tablet Take 325 mg by mouth 2 (two) times daily.      . fish oil-omega-3 fatty acids 1000 MG capsule Take 1 g by mouth 3 (three) times daily.      . furosemide (LASIX) 40 MG tablet Take 40 mg by mouth 2 (two) times daily.       . Multiple Vitamins-Minerals (MULTIVITAMINS THER. W/MINERALS) TABS Take 1 tablet by mouth 2 (two) times daily.       Marland Kitchen  ondansetron (ZOFRAN) 4 MG tablet Take 1 tablet (4 mg total) by mouth every 8 (eight) hours as needed for nausea.  30 tablet  1  . Oxycodone HCl 10 MG TABS Take 10 mg by mouth 3 (three) times daily as needed.      Marland Kitchen oxymorphone (OPANA ER) 10 MG T12A 12 hr tablet Take 10 mg by mouth every 12 (twelve) hours.      . pantoprazole (PROTONIX) 40 MG tablet Take 1 tablet (40 mg total) by mouth daily.  30 tablet  4  . potassium chloride (K-DUR) 10 MEQ tablet Take 20 mEq by mouth daily.       . Probiotic Product (ALIGN) 4 MG CAPS Take 4 mg by mouth daily.       Marland Kitchen spironolactone (ALDACTONE) 50 MG tablet Take 50 mg by mouth daily.       . sucralfate (CARAFATE) 1 G tablet Take 1 tablet (1 g total) by mouth 4 (four) times daily.  120 tablet  2  . ursodiol (ACTIGALL) 300 MG capsule Take 2 capsules (600 mg total) by mouth 2 (two) times daily.  120 capsule  1       FAO:ZHYQM from  the symptoms mentioned above,there are no other symptoms referable to all systems reviewed.  Marland Kitchen acidophilus  1 capsule Oral Daily  . aspirin EC  81 mg Oral Daily  . budesonide  6 mg Oral Daily  . colchicine  0.6 mg Oral BID  . ferrous sulfate  325 mg Oral BID  . furosemide  40 mg Intravenous Q12H  . heparin  5,000 Units Subcutaneous Q8H  . morphine  30 mg Oral Q12H  . multivitamin with minerals  1 tablet Oral BID  . omega-3 acid ethyl esters  1 g Oral TID  . pantoprazole  40 mg Oral Daily  . spironolactone  50 mg Oral Daily  . sucralfate  1 g Oral QID  . ursodiol  600 mg Oral BID      Physical Exam: Vital signs in last 24 hours: Temp:  [97.5 F (36.4 C)-98.3 F (36.8 C)] 97.5 F (36.4 C) (04/30 0700) Pulse Rate:  [81-163] 85 (04/30 0700) Resp:  [16-24] 16 (04/30 0700) BP: (105-140)/(64-89) 123/75 mmHg (04/30 0700) SpO2:  [100 %] 100 % (04/30 0700) Weight:  [201 lb 8 oz (91.4 kg)-221 lb (100.245 kg)] 201 lb 8 oz (91.4 kg) (04/30 0700) Weight change:  Last BM Date: 04/11/13  Intake/Output from previous day: 04/29 0701 - 04/30 0700 In: 240 [P.O.:240] Out: 2475 [Urine:2475]     Physical Exam: General- pt is awake,alert, oriented to time place and person Resp- No acute REsp distress, CTA B/L NO Rhonchi CVS- S1S2 regular in rate and rhythm GIT- BS+, soft, NT, Distended EXT- 1+ LE Edema, Cyanosis CNS- CN 2-12 grossly intact. Moving all 4 extremities Psych- normal mood and affect    Lab Results: CBC  Recent Labs  04/11/13 1608 04/12/13 0454  WBC 5.7 6.6  HGB 7.9* 8.3*  HCT 24.1* 25.3*  PLT 185 191    BMET  Recent Labs  04/11/13 1033 04/11/13 1608 04/12/13 0454  NA 135  --  134*  K 4.5  --  5.1  CL 100  --  100  CO2 27  --  27  GLUCOSE 141*  --  111*  BUN 24*  --  25*  CREATININE 2.28* 2.26* 2.39*  CALCIUM 8.4  --  8.5    Creat trend 2014 0.61==>2.28==>2.39  MICRO No results  found for this or any previous visit (from the past 240 hour(s)).     Lab Results  Component Value Date   CALCIUM 8.5 04/12/2013   PHOS 3.8 11/15/2012   CT abdo IMPRESSION:  Moderate to large amount of ascites within the pelvis and lower  left abdomen - appears loculated.  Marked splenomegaly and mild hepatomegaly - this findings just of  portal hypertension.  Diffuse subcutaneous and mesenteric edema.  Punctate nonobstructing mid right renal calculus.    Impression: 1)Renal   AKI secondary to Prerenal                               ATN                               Hepatorenal                                Renal                                   ? 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  GI following  5)CVs- hx of pericarditis  6)Electrolytes Hypokalemic  NOrmonatremic  7)Acid base Co2 at goal     Plan:  Will ask for Renal U/s Will iniatie auto immune work up - but in py with Anti TNF therapy there is high likely hood of having ANA and Anti DNA antibodies Will benefit from Urology eval Will ask for Proteinuria work up Will benefit from ascitic tap Pt may need renal biopsy as differential is wide. But pt on baby ASA so will have to wait at least 7 days. Will follow bmet  Will ask for CPK, A1c Agree with lasix Will add 75 ml/hr of IVF -if swelling worsens will increase lasix.     Arles Rumbold S 04/12/2013, 9:48 AM

## 2013-04-13 DIAGNOSIS — R188 Other ascites: Secondary | ICD-10-CM | POA: Diagnosis present

## 2013-04-13 DIAGNOSIS — N200 Calculus of kidney: Secondary | ICD-10-CM

## 2013-04-13 DIAGNOSIS — K5090 Crohn's disease, unspecified, without complications: Secondary | ICD-10-CM

## 2013-04-13 DIAGNOSIS — K7460 Unspecified cirrhosis of liver: Secondary | ICD-10-CM

## 2013-04-13 DIAGNOSIS — R109 Unspecified abdominal pain: Secondary | ICD-10-CM

## 2013-04-13 DIAGNOSIS — D509 Iron deficiency anemia, unspecified: Secondary | ICD-10-CM

## 2013-04-13 LAB — ANA: Anti Nuclear Antibody(ANA): NEGATIVE

## 2013-04-13 LAB — BASIC METABOLIC PANEL
BUN: 24 mg/dL — ABNORMAL HIGH (ref 6–23)
CO2: 28 mEq/L (ref 19–32)
Calcium: 8.6 mg/dL (ref 8.4–10.5)
Chloride: 99 mEq/L (ref 96–112)
Creatinine, Ser: 2.39 mg/dL — ABNORMAL HIGH (ref 0.50–1.35)
GFR calc Af Amer: 39 mL/min — ABNORMAL LOW (ref >90)
GFR calc non Af Amer: 33 mL/min — ABNORMAL LOW (ref >90)
Glucose, Bld: 97 mg/dL (ref 70–99)
Potassium: 4.3 mEq/L (ref 3.5–5.1)
Sodium: 137 mEq/L (ref 135–145)

## 2013-04-13 LAB — GRAM STAIN

## 2013-04-13 LAB — CBC
HCT: 25.4 % — ABNORMAL LOW (ref 39.0–52.0)
Hemoglobin: 8.3 g/dL — ABNORMAL LOW (ref 13.0–17.0)
MCH: 24.9 pg — ABNORMAL LOW (ref 26.0–34.0)
MCHC: 32.7 g/dL (ref 30.0–36.0)
MCV: 76 fL — ABNORMAL LOW (ref 78.0–100.0)
Platelets: 187 10*3/uL (ref 150–400)
RBC: 3.34 MIL/uL — ABNORMAL LOW (ref 4.22–5.81)
RDW: 19.4 % — ABNORMAL HIGH (ref 11.5–15.5)
WBC: 7.6 10*3/uL (ref 4.0–10.5)

## 2013-04-13 LAB — MPO/PR-3 (ANCA) ANTIBODIES
Myeloperoxidase Abs: 3 AU/mL (ref <20)
Serine Protease 3: 5 AU/mL (ref <20)

## 2013-04-13 LAB — HEPATITIS PANEL, ACUTE
HCV Ab: NEGATIVE
Hep A IgM: NEGATIVE
Hep B C IgM: NEGATIVE
Hepatitis B Surface Ag: NEGATIVE

## 2013-04-13 LAB — ANTI-DNA ANTIBODY, DOUBLE-STRANDED: ds DNA Ab: 20 IU/mL (ref <30)

## 2013-04-13 LAB — ALBUMIN: Albumin: 2.5 g/dL — ABNORMAL LOW (ref 3.5–5.2)

## 2013-04-13 LAB — PATHOLOGIST SMEAR REVIEW: Path Review: REACTIVE

## 2013-04-13 LAB — GLOMERULAR BASEMENT MEMBRANE ANTIBODIES: GBM Ab: 1 AU/mL (ref <20)

## 2013-04-13 NOTE — Discharge Summary (Signed)
Physician Discharge Summary  Patient ID: David [name] MRN: [id number] DOB/AGE: 1976-01-06 37 y.o.  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     aspirin 81 MG EC tablet  Take 1 tablet (81 mg total) by mouth daily.     budesonide 3 MG 24 hr capsule  Commonly known as:  ENTOCORT EC  Take 6 mg by mouth daily.     calcium carbonate 600 MG Tabs  Commonly known as:  OS-CAL  Take 600 mg by mouth 2 (two) times daily with a meal.     clonazePAM 0.5 MG tablet  Commonly known as:  KLONOPIN  Take 0.5 mg by mouth 2 (two) times daily as needed for anxiety.     colchicine 0.6 MG tablet  Take 1 tablet (0.6 mg total) by mouth 2 (two) times daily.     dicyclomine 10 MG capsule  Commonly known as:  BENTYL  Take 1 capsule (10 mg total) by mouth 3 (three) times daily as needed.     ferrous sulfate 325 (65 FE) MG tablet  Take 325 mg by mouth 2 (two) times daily.     fish oil-omega-3 fatty acids 1000 MG capsule  Take 1 g by mouth 3 (three) times daily.     furosemide 40 MG tablet  Commonly known as:  LASIX  Take 40 mg by mouth 2 (two) times daily.     multivitamins ther. w/minerals Tabs  Take 1 tablet by mouth 2 (two) times daily.     ondansetron 4 MG tablet  Commonly known as:  ZOFRAN  Take 1 tablet (4 mg total) by mouth every 8 (eight) hours as needed for nausea.     Oxycodone HCl 10 MG Tabs  Take 10 mg by mouth 3 (three) times daily as needed.     oxymorphone 10 MG T12a 12 hr tablet  Commonly known as:  OPANA ER  Take 10 mg by mouth every 12  (twelve) hours.     pantoprazole 40 MG tablet  Commonly known as:  PROTONIX  Take 1 tablet (40 mg total) by mouth daily.     potassium chloride 10 MEQ tablet  Commonly known as:  K-DUR  Take 20 mEq by mouth daily.     promethazine 25 MG tablet  Commonly known as:  PHENERGAN  Take 25 mg by mouth every 6 (six) hours as needed for nausea.     spironolactone 50 MG tablet  Commonly known as:  ALDACTONE  Take 50 mg by mouth daily.     sucralfate 1 G tablet  Commonly known as:  CARAFATE  Take 1 tablet (1 g total) by mouth 4 (four) times daily.     ursodiol 300 MG capsule  Commonly known as:  ACTIGALL  Take 2 capsules (600 mg total) by mouth 2 (two) times daily.            Discharge Orders   Future Appointments  Provider Department Dept Phone   04/24/2013 10:45 AM Ap-Spl[facility] Team A [facility] [phone]   04/26/2013 8:30 AM [facility]Cardiop[facility] [place] [facility] [phone]   05/01/2013 9:30 AM Malissa Hippo, MD [facility] [phone]   05/04/2013 10:45 AM [facility]Splcp Team A [facility] [phone]   06/02/2013 9:45 AM Gaylord Shih, MD [facility] [phone]   06/29/2013 10:45 AM Ap[facility]Splcp Team A [facility] [phone]   08/24/2013 10:45 AM Ap[facility]Spl[facility] Team A [facility] [phone]   Future Orders Complete By Expires     Activity as tolerated - No restrictions  As directed     Diet - low sodium heart healthy  As directed        Follow-up Information   Follow up with [facility], MD On 04/25/2013. (@ 10:15  -              The office will contact pt for bloodwork prior to apt.)    Contact information:   [address] [place] [address] [phone]       Follow up with Ky Barban, MD.   Contact information:   [address] [place] [address] [phone]       Disposition: 01-Home or Self  Care  Discharged Condition:   Consults: Treatment Team:  Malissa Hippo, MD Jamse Mead, MD Ky Barban, MD Corbin Ade, MD Interventional radiology  Labs:   Results for orders placed during the hospital encounter of 04/11/13 (from the past 48 hour(s))  CBC     Status: Abnormal   Collection Time    04/11/13  4:08 PM      Result Value Range   WBC 5.7  4.0 - 10.5 K/uL   RBC 3.18 (*) 4.22 - 5.81 MIL/uL   Hemoglobin 7.9 (*) 13.0 - 17.0 g/dL   HCT 46.2 (*) 70.3 - 50.0 %   MCV 75.8 (*) 78.0 - 100.0 fL   MCH 24.8 (*) 26.0 - 34.0 pg   MCHC 32.8  30.0 - 36.0 g/dL   RDW 93.8 (*) 18.2 - 99.3 %   Platelets 185  150 - 400 K/uL  CREATININE, SERUM     Status: Abnormal   Collection Time    04/11/13  4:08 PM      Result Value Range   Creatinine, Ser 2.26 (*) 0.50 - 1.35 mg/dL   GFR calc non Af Amer 36 (*) >90 mL/min   GFR calc Af Amer 41 (*) >90 mL/min   Comment:            The eGFR has been calculated     using the CKD EPI equation.     This calculation has not been     validated in all clinical     situations.     eGFR's persistently     <90 mL/min signify     possible Chronic Kidney Disease.  VITAMIN B12     Status: Abnormal   Collection Time    04/11/13  4:08 PM      Result Value Range   Vitamin B-12 927 (*) 211 - 911 pg/mL  FOLATE     Status: None   Collection Time    04/11/13  4:08 PM      Result Value Range   Folate 15.8     Comment: (NOTE)     Reference Ranges  Deficient:       0.4 - 3.3 ng/mL            Indeterminate:   3.4 - 5.4 ng/mL            Normal:              > 5.4 ng/mL  IRON AND TIBC     Status: Abnormal   Collection Time    04/11/13  4:08 PM      Result Value Range   Iron 12 (*) 42 - 135 ug/dL   TIBC 265  215 - 435 ug/dL   Saturation Ratios 5 (*) 20 - 55 %   UIBC 253  125 - 400 ug/dL  FERRITIN     Status: None   Collection Time    04/11/13  4:08 PM      Result Value Range   Ferritin 33  22 - 322 ng/mL  RETICULOCYTES      Status: Abnormal   Collection Time    04/11/13  4:08 PM      Result Value Range   Retic Ct Pct 0.8  0.4 - 3.1 %   RBC. 3.18 (*) 4.22 - 5.81 MIL/uL   Retic Count, Manual 25.4  19.0 - 186.0 K/uL  COMPREHENSIVE METABOLIC PANEL     Status: Abnormal   Collection Time    04/12/13  4:54 AM      Result Value Range   Sodium 134 (*) 135 - 145 mEq/L   Potassium 5.1  3.5 - 5.1 mEq/L   Chloride 100  96 - 112 mEq/L   CO2 27  19 - 32 mEq/L   Glucose, Bld 111 (*) 70 - 99 mg/dL   BUN 25 (*) 6 - 23 mg/dL   Creatinine, Ser 2.39 (*) 0.50 - 1.35 mg/dL   Calcium 8.5  8.4 - 10.5 mg/dL   Total Protein 6.9  6.0 - 8.3 g/dL   Albumin 2.2 (*) 3.5 - 5.2 g/dL   AST 37  0 - 37 U/L   ALT 17  0 - 53 U/L   Alkaline Phosphatase 294 (*) 39 - 117 U/L   Total Bilirubin 0.3  0.3 - 1.2 mg/dL   GFR calc non Af Amer 33 (*) >90 mL/min   GFR calc Af Amer 39 (*) >90 mL/min   Comment:            The eGFR has been calculated     using the CKD EPI equation.     This calculation has not been     validated in all clinical     situations.     eGFR's persistently     <90 mL/min signify     possible Chronic Kidney Disease.  CBC     Status: Abnormal   Collection Time    04/12/13  4:54 AM      Result Value Range   WBC 6.6  4.0 - 10.5 K/uL   RBC 3.35 (*) 4.22 - 5.81 MIL/uL   Hemoglobin 8.3 (*) 13.0 - 17.0 g/dL   HCT 25.3 (*) 39.0 - 52.0 %   MCV 75.5 (*) 78.0 - 100.0 fL   MCH 24.8 (*) 26.0 - 34.0 pg   MCHC 32.8  30.0 - 36.0 g/dL   RDW 19.8 (*) 11.5 - 15.5 %   Platelets 191  150 - 400 K/uL  PROTIME-INR     Status: None   Collection Time    04/12/13  4:54 AM  Result Value Range   Prothrombin Time 14.6  11.6 - 15.2 seconds   INR 1.16  0.00 - 1.49  C4 COMPLEMENT     Status: None   Collection Time    04/12/13 11:17 AM      Result Value Range   Complement C4, Body Fluid 25  10 - 40 mg/dL  C3 COMPLEMENT     Status: None   Collection Time    04/12/13 11:17 AM      Result Value Range   C3 Complement 136  90 -  180 mg/dL  HEPATITIS PANEL, ACUTE     Status: None   Collection Time    04/12/13 11:17 AM      Result Value Range   Hepatitis B Surface Ag NEGATIVE  NEGATIVE   HCV Ab NEGATIVE  NEGATIVE   Hep A IgM NEGATIVE  NEGATIVE   Hep B C IgM NEGATIVE  NEGATIVE   Comment: (NOTE)     High levels of Hepatitis B Core IgM antibody are detectable     during the acute stage of Hepatitis B. This antibody is used     to differentiate current from past HBV infection.  GLOMERULAR BASEMENT MEMBRANE ANTIBODIES     Status: None   Collection Time    04/12/13 11:17 AM      Result Value Range   GBM Ab 1  <20 AU/mL  SEDIMENTATION RATE     Status: Abnormal   Collection Time    04/12/13 11:17 AM      Result Value Range   Sed Rate 80 (*) 0 - 16 mm/hr  MPO/PR-3 (ANCA) ANTIBODIES     Status: None   Collection Time    04/12/13 11:17 AM      Result Value Range   Myeloperoxidase Abs 3  <20 AU/mL   Serine Protease 3 5  <20 AU/mL  ANA     Status: None   Collection Time    04/12/13 11:17 AM      Result Value Range   ANA NEGATIVE  NEGATIVE  ANTI-DNA ANTIBODY, DOUBLE-STRANDED     Status: None   Collection Time    04/12/13 11:17 AM      Result Value Range   ds DNA Ab 20  <30 IU/mL   Comment: (NOTE)                 <  30 IU/mL     Negative                 30-40 IU/mL     Equivocal                 >  40 IU/mL     Positive  CK     Status: None   Collection Time    04/12/13 11:17 AM      Result Value Range   Total CK 37  7 - 232 U/L  HEMOGLOBIN A1C     Status: None   Collection Time    04/12/13 11:17 AM      Result Value Range   Hemoglobin A1C 5.2  <5.7 %   Comment: (NOTE)  According to the ADA Clinical Practice Recommendations for 2011, when     HbA1c is used as a screening test:      >=6.5%   Diagnostic of Diabetes Mellitus               (if abnormal result is confirmed)     5.7-6.4%   Increased risk of developing Diabetes  [place]     References:Diagnosis and Classification of Diabetes Mellitus,Diabetes     Care,2011,34(Suppl 1):S62-S69 and Standards of Medical Care in             Diabetes - 2011,Diabetes Care,2011,34 (Suppl 1):S11-S61.   Mean Plasma Glucose 103  <117 mg/dL  PROTEIN, URINE, RANDOM     Status: None   Collection Time    04/12/13 12:16 PM      Result Value Range   Total Protein, Urine 12     Comment: NO NORMAL RANGE ESTABLISHED FOR THIS TEST  CREATININE, URINE, RANDOM     Status: None   Collection Time    04/12/13 12:16 PM      Result Value Range   Creatinine, Urine 31.49    BODY FLUID CELL COUNT WITH DIFFERENTIAL     Status: Abnormal   Collection Time    04/12/13  4:24 PM      Result Value Range   Fluid Type-FCT ASCITIC     Comment: FLUID   Color, Fluid YELLOW  YELLOW   Appearance, Fluid CLEAR (*) CLEAR   WBC, Fluid 283  0 - 1000 cu mm   Neutrophil Count, Fluid 1  0 - 25 %   Lymphs, Fluid 83     Monocyte-Macrophage-Serous Fluid 16 (*) 50 - 90 %   Eos, Fluid 0     Other Cells, Fluid OTHER CELLS IDENTIFIED AS MESOTHELIAL CELLS     Comment: PENDING PATHOLOGIST REVIEW  GLUCOSE, SEROUS FLUID     Status: None   Collection Time    04/12/13  4:24 PM      Result Value Range   Glucose, Fluid 89     Comment:            FLUID GLUCOSE LEVELS OF <60     mg/dL OR VALUES OF 40 mg/dL     LESS THAN A SIMULTANEOUS     SERUM LEVEL ARE CONSIDERED     DECREASED.   Fluid Type-FGLU ASCITIC     Comment: FLUID  PROTEIN, BODY FLUID     Status: None   Collection Time    04/12/13  4:24 PM      Result Value Range   Total protein, fluid 2.1     Comment: NO NORMAL RANGE ESTABLISHED FOR THIS TEST   Fluid Type-FTP ASCITIC     Comment: FLUID  CULTURE, BODY FLUID-BOTTLE     Status: None   Collection Time    04/12/13  4:24 PM      Result Value Range   Specimen Description FLUID ASCITIC     Special Requests BOTTLES DRAWN AEROBIC AND ANAEROBIC 10CC     Culture NO GROWTH 1 DAY     Report Status  PENDING    GRAM STAIN     Status: None   Collection Time    04/12/13  4:24 PM      Result Value Range   Specimen Description FLUID ASCITIC     Special Requests NONE     Gram Stain       Value: WBC PRESENT, PREDOMINANTLY MONONUCLEAR  NO ORGANISMS SEEN     CYTOSPIN SLIDE   Report Status 04/13/2013 FINAL    PATHOLOGIST SMEAR REVIEW     Status: None   Collection Time    04/12/13  4:24 PM      Result Value Range   Path Review REACTIVE MESOTHELIAL CELLS     Comment: SMALL LYMPHOCYTES     NO MALIGNANT CELLS     Reviewed by H. Hollice Espy, M.D.     [id number]  CBC     Status: Abnormal   Collection Time    04/13/13  4:45 AM      Result Value Range   WBC 7.6  4.0 - 10.5 K/uL   RBC 3.34 (*) 4.22 - 5.81 MIL/uL   Hemoglobin 8.3 (*) 13.0 - 17.0 g/dL   HCT 47.8 (*) 29.5 - 62.1 %   MCV 76.0 (*) 78.0 - 100.0 fL   MCH 24.9 (*) 26.0 - 34.0 pg   MCHC 32.7  30.0 - 36.0 g/dL   RDW 30.8 (*) 65.7 - 84.6 %   Platelets 187  150 - 400 K/uL  BASIC METABOLIC PANEL     Status: Abnormal   Collection Time    04/13/13  4:45 AM      Result Value Range   Sodium 137  135 - 145 mEq/L   Potassium 4.3  3.5 - 5.1 mEq/L   Chloride 99  96 - 112 mEq/L   CO2 28  19 - 32 mEq/L   Glucose, Bld 97  70 - 99 mg/dL   BUN 24 (*) 6 - 23 mg/dL   Creatinine, Ser 9.62 (*) 0.50 - 1.35 mg/dL   Calcium 8.6  8.4 - 95.2 mg/dL   GFR calc non Af Amer 33 (*) >90 mL/min   GFR calc Af Amer 39 (*) >90 mL/min   Comment:            The eGFR has been calculated     using the CKD EPI equation.     This calculation has not been     validated in all clinical     situations.     eGFR's persistently     <90 mL/min signify     possible Chronic Kidney Disease.  ALBUMIN     Status: Abnormal   Collection Time    04/13/13  4:45 AM      Result Value Range   Albumin 2.5 (*) 3.5 - 5.2 g/dL    Diagnostics:  Ct Abdomen Pelvis Wo Contrast  04/11/2013  *RADIOLOGY REPORT*  Clinical Data: 37 year old male with abdominal and pelvic pain.  History of Crohn's disease, intestinal surgery and elevated creatinine.  CT ABDOMEN AND PELVIS WITHOUT CONTRAST  Technique:  Multidetector CT imaging of the abdomen and pelvis was performed following the standard protocol without intravenous contrast.  Comparison: 09/27/2012 MRI and 08/21/2011 CT  Findings: Mild hepatomegaly is again identified. Splenomegaly has increased with a splenic volume of 3150 ml. No focal hepatic or splenic lesions are identified. The kidneys are unremarkable except for a punctate nonobstructing mid right renal calculus. The adrenal glands and pancreas are unremarkable. Please note that parenchymal abnormalities may be missed as intravenous contrast was not administered. The patient is status post cholecystectomy.  A moderate to large amount of ascites is identified primarily within the pelvis and lower left abdomen and appears to be loculated. Diffuse mesenteric and subcutaneous edema is noted.  There is no evidence of biliary dilatation, abdominal aortic aneurysm or definite enlarged lymph  nodes. Surgical clips in the region of the distal small bowel noted. There is no evidence of bowel obstruction or pneumoperitoneum. The bladder is within normal limits. No acute or suspicious bony abnormalities are identified.  IMPRESSION: Moderate to large amount of ascites within the pelvis and lower left abdomen - appears loculated.  Marked splenomegaly and mild hepatomegaly - this findings just of portal hypertension.  Diffuse subcutaneous and mesenteric edema.  Punctate nonobstructing mid right renal calculus.   Original Report Authenticated By: Harmon Pier, M.D.    US Renal  04/12/2013  *RADIOLOGY REPORT*  Clinical Data: Renal failure.  RENAL/URINARY TRACT ULTRASOUND COMPLETE  Comparison:  04/11/2013 CT.  Findings:  Right Kidney:  15.1 cm.  No calculi or hydronephrosis.  The fluid collection in the anterior right pararenal space probably represents fluid in Morison's pouch although this is more  loculated than typically seen.  This correlates well with the appearance on prior CT.  There is little if any mass effect on the adjacent right upper renal pole.  The right kidney demonstrates increased echogenicity, which can be seen in acute renal failure.  Left Kidney:  12.9 cm.  Extrarenal pelvis is present.  No calculi are visualized by ultrasound.  Tiny calculi were seen on prior CT. Increased echogenicity is nonspecific but can be associated with acute renal insufficiency.  Bladder:  Distended.  No debris or trabeculation.  IMPRESSION:  1.  Mildly enlarged echogenic kidneys.  The echotexture is nonspecific but can be associated with acute renal insufficiency, other causes. 2.  Left extrarenal pelvis. 3.  Fluid superior to the right renal pole correlates with fluid on the prior CT, likely representing loculated ascites in Morison's pouch.   Original Report Authenticated By: Andreas Newport, M.D.    US Paracentesis  04/12/2013  *RADIOLOGY REPORT*  Clinical Data: Liver disease, ascites and fever.  ULTRASOUND GUIDED PARACENTESIS  Comparison:  CT of the abdomen and pelvis on 04/11/2013.  An ultrasound guided paracentesis was thoroughly discussed with the patient and questions answered.  The benefits, risks, alternatives and complications were also discussed.  The patient understands and wishes to proceed with the procedure.  Written consent was obtained.  Ultrasound was performed to localize and mark an adequate pocket of fluid in the left lower quadrant of the abdomen.  The area was then prepped and draped in the normal sterile fashion.  1% Lidocaine was used for local anesthesia.  Under ultrasound guidance a 19 gauge Yueh catheter was introduced.  Paracentesis was performed.  The catheter was removed and a dressing applied.  Complications:  None  Findings:  A total of approximately 180 ml of clear fluid was removed.  The entire fluid sample was sent for requested laboratory analysis.  IMPRESSION: Ultrasound  guided paracentesis performed for diagnostic purposes. 180 ml of fluid was removed and sent for laboratory analysis.   Original Report Authenticated By: Irish Lack, M.D.     Procedures:  Paracentesis  Full Code   Hospital Course: See H&P for complete admission details. The patient is a 37 year old white male with history of Crohn's disease and cirrhosis. He presented with back pain and hematuria. On physical examination, he appeared chronically ill. He had leg edema. Distended abdomen with evidence of ascites. Creatinine was noted to be elevated at 2.28. Patient was admitted. GI, nephrology were consulted. Also urology. GI recommended paracentesis. Results are pending. Patient had no further hematuria. Patient was given IV fluids, as well as diuretics without any change in his creatinine. 24-hour urine  was ordered but not collected properly. Urology reports that patient will need eventual cystoscopy. Patient is adamant about leaving. Urology will see the patient in the office. Nephrology will reorder 24 hour urine and any other studies needed. Patient's edema has improved. He is feeling better. Total time on the day of discharge greater than 30 minutes per  Discharge Exam:  Blood pressure 112/70, pulse 76, temperature 98.7 F (37.1 C), temperature source Oral, resp. rate 18, height 5\' 11"  (1.803 m), weight 88.043 kg (194 lb 1.6 oz), SpO2 96.00%.  General: Comfortable. Alert and oriented Lungs clear to auscultation bilaterally without wheeze rhonchi or rales Cardiovascular regular rate rhythm without murmurs gallops rubs Abdomen soft nontender Extremities no edema  Signed: Gerry Heaphy L 04/13/2013, 2:53 PM

## 2013-04-13 NOTE — Progress Notes (Signed)
04/13/13 1608 Reviewed discharge instructions with patient. Given copy of instructions, medication list, when medications due, and f/u appointment information. Discussed when to call MD as needed.  Notified patient that Dr Jerre Simon office will call with scheduled cystoscopy as outpatient.  Dr Kristian Covey office to f/u regarding lab work and 24 hour urine collection. Pt verbalizes understanding. IV site d/c'd and within normal limits. No c/o of discomfort at time of discharge. Pt states ready to go home. Patient's children and mother present for discharge home. Pt left floor in stable condition via w/c accompanied by nurse tech. Earnstine Regal, RN

## 2013-04-13 NOTE — Consult Note (Signed)
NAME:  David [name], David [name]               ACCOUNT NO.:  [id number]  MEDICAL RECORD NO.:  [id number]  LOCATION:  A222                          FACILITY:  [facility]  PHYSICIAN:  Ky Barban, M.D.DATE OF BIRTH:  1976/10/31  DATE OF CONSULTATION: DATE OF DISCHARGE:                                CONSULTATION   CHIEF COMPLAINT:  Multiple medical problems and presented with gross painless initial hematuria.  HISTORY OF PRESENT ILLNESS:  This 37 year old gentleman who has multiple medical problems, came to the emergency room for 4 days' history of having backache and also having gross initial hematuria.  No dysuria, frequency.  He does have some hesitancy.  Takes some little while to urinate.  Urinary stream is slow, he says sometime it is like that, not all the time.  He has no fever or chills, and never had hematuria before.  He was also found to have renal failure.  His creatinine was 2.3 and the patient was admitted for further management and workup.  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  Has a history of having GERD, chronic liver disease, pericarditis, pericardial effusion and also pleural effusion. Hypertension, pneumonia, shortness of breath, history of blood transfusion, history of stomach ulcer, duodenal ulcer, depression, kidney stones.  PAST SURGICAL HISTORY:  Anal examination under anesthesia and treatment of fistula-in-ano in July 2012.  Small intestinal surgery in 2004, cholecystectomy in 2003, appendicectomy in 2003, video-assisted thoracoscopy in December 2013.  Pericardial window in December 2013. Upper endoscopy in January 2014, by Dr. Lionel December.  SOCIAL HISTORY:  He is married.  Lives with his wife.  Does not smoke. Does not drink alcohol.  PHYSICAL EXAMINATION:  GENERAL:   Fully conscious, alert, oriented, not in acute distress.  Pleasant gentleman.  Has protuberant abdomen from ascites which has been partially drained today.  He looks chronically ill and sick. VITAL SIGNS:  Blood pressure is 140/89, pulse is 93 per minute, temperature 98.2, respirations 18, and oxygen saturation 100%. HEENT:  Eyes, no significant pallor.  ENT, negative. NECK:  No lymphadenopathy. CARDIOVASCULAR:  Negative. LUNGS:  Clear. ABDOMEN:  Distended, soft, nontender. NEUROLOGIC:  No deficits, alert.  LABORATORY DATA:  These values are on April 11, 2013.  Sodium 135, potassium 4.5, chloride 100, CO2 is 27, glucose 141, BUN is 24, creatinine 2.28.  Calcium 8.4.  Alkaline phosphatase is 292, bilirubin total 0.2.  Albumin 2.2, protein 6.9.  Lipase 37, amylase 44, normal. WBC count 7.3, hematocrit 25.8.  IMAGING STUDIES:  CT abdomen and pelvis was done without contrast. Kidneys are unremarkable.  There is a punctate nonobstructing right renal calculus.  Splenomegaly.  No other urinary tract pathology.  Large amount of ascites identified.  IMPRESSION:  Gross initial hematuria, renal failure.  I told the patient I doubt very much in the urinary tract, there is any obstruction.  The stone is of not very significance as far as the renal failure is concerned.  He will need to be seen by a nephrologist and because of this  gross hematuria, he needs to be cystoscoped.  I am going to order urine cytologies and I will schedule for Friday cystoscopy under anesthesia.  I told the patient I will come back and talk to him and if there is any other question, he can ask me.     Ky Barban, M.D.     MIJ/MEDQ  D:  04/12/2013  T:  04/13/2013  Job:  [id number]

## 2013-04-13 NOTE — Progress Notes (Signed)
04/13/13 1601 Late entry for 1500. Patient okay for discharge per Dr Lendell Caprice. Dr Kristian Covey stated this morning pt okay for discharge home from his standpoint, may have 24 hour urine collection as outpatient. Notified Dr Jerre Simon this afternoon of patient request to go home and current discharge orders. Stated okay for discharge, cystoscopy to be scheduled as outpatient. Dr Jerre Simon office to schedule outpatient cystoscopy and notify patient. Earnstine Regal, RN

## 2013-04-13 NOTE — Progress Notes (Signed)
Subjective:  Without complaints. No longer having pain. Wants to go home. Able to tolerate diet. Crohn's inactive.  Objective: Vital signs in last 24 hours: Temp:  [97.8 F (36.6 C)-98.7 F (37.1 C)] 98.7 F (37.1 C) (05/01 0700) Pulse Rate:  [75-79] 76 (05/01 0700) Resp:  [18-20] 18 (05/01 0700) BP: (112-123)/(68-75) 112/70 mmHg (05/01 0700) SpO2:  [96 %-100 %] 96 % (05/01 0700) Weight:  [194 lb 1.6 oz (88.043 kg)] 194 lb 1.6 oz (88.043 kg) (05/01 0700) Last BM Date: 04/11/13 General:   Alert,  Chronically ill-appearing WM, pleasant and cooperative in NAD Head:  Normocephalic and atraumatic. Eyes:  Sclera clear, no icterus.  Chest: CTA bilaterally without rales, rhonchi, crackles.    Heart:  Regular rate and rhythm; no murmurs, clicks, rubs,  or gallops. Abdomen:  Soft, distended. Right sided abd tenderness (chronic). Normal bowel sounds, without guarding, and without rebound.   Extremities:  Without clubbing, deformity or edema. Neurologic:  Alert and  oriented x4;  grossly normal neurologically. Skin:  Intact without significant lesions or rashes. Psych:  Alert and cooperative. Normal mood and affect.  Intake/Output from previous day: 04/30 0701 - 05/01 0700 In: 596.3 [I.V.:396.3; IV Piggyback:200] Out: 1700 [Urine:1700] Intake/Output this shift:    Lab Results: CBC  Recent Labs  04/11/13 1608 04/12/13 0454 04/13/13 0445  WBC 5.7 6.6 7.6  HGB 7.9* 8.3* 8.3*  HCT 24.1* 25.3* 25.4*  MCV 75.8* 75.5* 76.0*  PLT 185 191 187   Lab Results  Component Value Date   VITAMINB12 927* 04/11/2013   Lab Results  Component Value Date   IRON 12* 04/11/2013   TIBC 265 04/11/2013   FERRITIN 33 04/11/2013   Lab Results  Component Value Date   FOLATE 15.8 04/11/2013     BMET  Recent Labs  04/11/13 1033 04/11/13 1608 04/12/13 0454 04/13/13 0445  NA 135  --  134* 137  K 4.5  --  5.1 4.3  CL 100  --  100 99  CO2 27  --  27 28  GLUCOSE 141*  --  111* 97  BUN 24*  --   25* 24*  CREATININE 2.28* 2.26* 2.39* 2.39*  CALCIUM 8.4  --  8.5 8.6   LFTs  Recent Labs  04/11/13 1033 04/12/13 0454 04/13/13 0445  BILITOT 0.2* 0.3  --   BILIDIR 0.1  --   --   IBILI 0.1  --   --   ALKPHOS 292* 294*  --   AST 40* 37  --   ALT 19 17  --   PROT 6.9 6.9  --   ALBUMIN 2.2* 2.2* 2.5*    Recent Labs  04/11/13 1033  LIPASE 37   PT/INR  Recent Labs  04/12/13 0454  LABPROT 14.6  INR 1.16   Ascitic fluid cell count from 04/12/13: WBC 283, neutrophil count 1%. Gram stain: no organisms Glucose: 89 Protein: 2.1   Culture, AFB, and cytology pending  Imaging Studies: Ct Abdomen Pelvis Wo Contrast  04/11/2013  *RADIOLOGY REPORT*  Clinical Data: 37 year old male with abdominal and pelvic pain. History of Crohn's disease, intestinal surgery and elevated creatinine.  CT ABDOMEN AND PELVIS WITHOUT CONTRAST  Technique:  Multidetector CT imaging of the abdomen and pelvis was performed following the standard protocol without intravenous contrast.  Comparison: 09/27/2012 MRI and 08/21/2011 CT  Findings: Mild hepatomegaly is again identified. Splenomegaly has increased with a splenic volume of 3150 ml. No focal hepatic or splenic lesions are identified. The kidneys  are unremarkable except for a punctate nonobstructing mid right renal calculus. The adrenal glands and pancreas are unremarkable. Please note that parenchymal abnormalities may be missed as intravenous contrast was not administered. The patient is status post cholecystectomy.  A moderate to large amount of ascites is identified primarily within the pelvis and lower left abdomen and appears to be loculated. Diffuse mesenteric and subcutaneous edema is noted.  There is no evidence of biliary dilatation, abdominal aortic aneurysm or definite enlarged lymph nodes. Surgical clips in the region of the distal small bowel noted. There is no evidence of bowel obstruction or pneumoperitoneum. The bladder is within normal  limits. No acute or suspicious bony abnormalities are identified.  IMPRESSION: Moderate to large amount of ascites within the pelvis and lower left abdomen - appears loculated.  Marked splenomegaly and mild hepatomegaly - this findings just of portal hypertension.  Diffuse subcutaneous and mesenteric edema.  Punctate nonobstructing mid right renal calculus.   Original Report Authenticated By: Harmon Pier, M.D.    US Renal  04/12/2013  *RADIOLOGY REPORT*  Clinical Data: Renal failure.  RENAL/URINARY TRACT ULTRASOUND COMPLETE  Comparison:  04/11/2013 CT.  Findings:  Right Kidney:  15.1 cm.  No calculi or hydronephrosis.  The fluid collection in the anterior right pararenal space probably represents fluid in Morison's pouch although this is more loculated than typically seen.  This correlates well with the appearance on prior CT.  There is little if any mass effect on the adjacent right upper renal pole.  The right kidney demonstrates increased echogenicity, which can be seen in acute renal failure.  Left Kidney:  12.9 cm.  Extrarenal pelvis is present.  No calculi are visualized by ultrasound.  Tiny calculi were seen on prior CT. Increased echogenicity is nonspecific but can be associated with acute renal insufficiency.  Bladder:  Distended.  No debris or trabeculation.  IMPRESSION:  1.  Mildly enlarged echogenic kidneys.  The echotexture is nonspecific but can be associated with acute renal insufficiency, other causes. 2.  Left extrarenal pelvis. 3.  Fluid superior to the right renal pole correlates with fluid on the prior CT, likely representing loculated ascites in Morison's pouch.   Original Report Authenticated By: Andreas Newport, M.D.    US Paracentesis  04/12/2013  *RADIOLOGY REPORT*  Clinical Data: Liver disease, ascites and fever.  ULTRASOUND GUIDED PARACENTESIS  Comparison:  CT of the abdomen and pelvis on 04/11/2013.  An ultrasound guided paracentesis was thoroughly discussed with the patient and  questions answered.  The benefits, risks, alternatives and complications were also discussed.  The patient understands and wishes to proceed with the procedure.  Written consent was obtained.  Ultrasound was performed to localize and mark an adequate pocket of fluid in the left lower quadrant of the abdomen.  The area was then prepped and draped in the normal sterile fashion.  1% Lidocaine was used for local anesthesia.  Under ultrasound guidance a 19 gauge Yueh catheter was introduced.  Paracentesis was performed.  The catheter was removed and a dressing applied.  Complications:  None  Findings:  A total of approximately 180 ml of clear fluid was removed.  The entire fluid sample was sent for requested laboratory analysis.  IMPRESSION: Ultrasound guided paracentesis performed for diagnostic purposes. 180 ml of fluid was removed and sent for laboratory analysis.   Original Report Authenticated By: Irish Lack, M.D.   [2 weeks]   Assessment: 37 y/o male with h/o complicated Crohn's disease who was admitted with hematuria,  new elevated creatinine, back pain, recent fever. Currently on Remicade and Entocort for Crohn's, last dose 6 weeks ago. At [facility] in 11/2012 with pericarditis and had pericardiocentesis/pericardial window at that time. 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  EGD 12/2012 by Dr. 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   Patient had diagnostic abdominal tap yesterday. Preliminary fluid analysis OK. No evidence of spontaneous bacterial peritonitis. Weight is down 7 pounds in the last 24 hours. Total of 25 pounds down since admission.  Plan: 1. Followup final fluid analysis including cytology and cultures.   LOS: 2 days   Tana Coast  04/13/2013, 8:04 AM

## 2013-04-13 NOTE — Progress Notes (Signed)
04/13/13 1005 Patient had 24 hour urine collection started 04/12/13 at 1300. 24 hour urine collection sent to lab at 0100 this morning per report from night shift RN. Notified lab this morning, stated would cancel. Notified Dr Kristian Covey, stated to restart 24 hour urine collection today. Earnstine Regal, RN

## 2013-04-13 NOTE — Progress Notes (Signed)
Subjective: Interval History: has no complaint of nausea or vomiting. Presently patient denies any difficulty breathing overall feels good.  Objective: Vital signs in last 24 hours: Temp:  [97.8 F (36.6 C)-98.7 F (37.1 C)] 98.7 F (37.1 C) (05/01 0700) Pulse Rate:  [75-79] 76 (05/01 0700) Resp:  [18-20] 18 (05/01 0700) BP: (112-123)/(68-75) 112/70 mmHg (05/01 0700) SpO2:  [96 %-100 %] 96 % (05/01 0700) Weight:  [88.043 kg (194 lb 1.6 oz)] 88.043 kg (194 lb 1.6 oz) (05/01 0700) Weight change: -12.202 kg (-26 lb 14.4 oz)  Intake/Output from previous day: 04/30 0701 - 05/01 0700 In: 596.3 [I.V.:396.3; IV Piggyback:200] Out: 1700 [Urine:1700] Intake/Output this shift:    General appearance: alert, cooperative and no distress Resp: clear to auscultation bilaterally Cardio: regular rate and rhythm, S1, S2 normal, no murmur, click, rub or gallop GI: Distended, nontender and positive bowel sound Extremities: extremities normal, atraumatic, no cyanosis or edema  Lab Results:  Recent Labs  04/12/13 0454 04/13/13 0445  WBC 6.6 7.6  HGB 8.3* 8.3*  HCT 25.3* 25.4*  PLT 191 187   BMET:  Recent Labs  04/12/13 0454 04/13/13 0445  NA 134* 137  K 5.1 4.3  CL 100 99  CO2 27 28  GLUCOSE 111* 97  BUN 25* 24*  CREATININE 2.39* 2.39*  CALCIUM 8.5 8.6   No results found for this basename: PTH,  in the last 72 hours Iron Studies:  Recent Labs  04/11/13 1608  IRON 12*  TIBC 265  FERRITIN 33    Studies/Results: Ct Abdomen Pelvis Wo Contrast  04/11/2013  *RADIOLOGY REPORT*  Clinical Data: 37 year old male with abdominal and pelvic pain. History of Crohn'[name] disease, intestinal surgery and elevated creatinine.  CT ABDOMEN AND PELVIS WITHOUT CONTRAST  Technique:  Multidetector CT imaging of the abdomen and pelvis was performed following the standard protocol without intravenous contrast.  Comparison: 09/27/2012 MRI and 08/21/2011 CT  Findings: Mild hepatomegaly is again  identified. Splenomegaly has increased with a splenic volume of 3150 ml. No focal hepatic or splenic lesions are identified. The kidneys are unremarkable except for a punctate nonobstructing mid right renal calculus. The adrenal glands and pancreas are unremarkable. Please note that parenchymal abnormalities may be missed as intravenous contrast was not administered. The patient is status post cholecystectomy.  A moderate to large amount of ascites is identified primarily within the pelvis and lower left abdomen and appears to be loculated. Diffuse mesenteric and subcutaneous edema is noted.  There is no evidence of biliary dilatation, abdominal aortic aneurysm or definite enlarged lymph nodes. Surgical clips in the region of the distal small bowel noted. There is no evidence of bowel obstruction or pneumoperitoneum. The bladder is within normal limits. No acute or suspicious bony abnormalities are identified.  IMPRESSION: Moderate to large amount of ascites within the pelvis and lower left abdomen - appears loculated.  Marked splenomegaly and mild hepatomegaly - this findings just of portal hypertension.  Diffuse subcutaneous and mesenteric edema.  Punctate nonobstructing mid right renal calculus.   Original Report Authenticated By: Harmon Pier, M.D.    US Renal  04/12/2013  *RADIOLOGY REPORT*  Clinical Data: Renal failure.  RENAL/URINARY TRACT ULTRASOUND COMPLETE  Comparison:  04/11/2013 CT.  Findings:  Right Kidney:  15.1 cm.  No calculi or hydronephrosis.  The fluid collection in the anterior right pararenal space probably represents fluid in Morison'[name] pouch although this is more loculated than typically seen.  This correlates well with the appearance on prior CT.  There is little if any mass effect on the adjacent right upper renal pole.  The right kidney demonstrates increased echogenicity, which can be seen in acute renal failure.  Left Kidney:  12.9 cm.  Extrarenal pelvis is present.  No calculi are  visualized by ultrasound.  Tiny calculi were seen on prior CT. Increased echogenicity is nonspecific but can be associated with acute renal insufficiency.  Bladder:  Distended.  No debris or trabeculation.  IMPRESSION:  1.  Mildly enlarged echogenic kidneys.  The echotexture is nonspecific but can be associated with acute renal insufficiency, other causes. 2.  Left extrarenal pelvis. 3.  Fluid superior to the right renal pole correlates with fluid on the prior CT, likely representing loculated ascites in Morison'[name] pouch.   Original Report Authenticated By: Andreas Newport, M.D.    US Paracentesis  04/12/2013  *RADIOLOGY REPORT*  Clinical Data: Liver disease, ascites and fever.  ULTRASOUND GUIDED PARACENTESIS  Comparison:  CT of the abdomen and pelvis on 04/11/2013.  An ultrasound guided paracentesis was thoroughly discussed with the patient and questions answered.  The benefits, risks, alternatives and complications were also discussed.  The patient understands and wishes to proceed with the procedure.  Written consent was obtained.  Ultrasound was performed to localize and mark an adequate pocket of fluid in the left lower quadrant of the abdomen.  The area was then prepped and draped in the normal sterile fashion.  1% Lidocaine was used for local anesthesia.  Under ultrasound guidance a 19 gauge Yueh catheter was introduced.  Paracentesis was performed.  The catheter was removed and a dressing applied.  Complications:  None  Findings:  A total of approximately 180 ml of clear fluid was removed.  The entire fluid sample was sent for requested laboratory analysis.  IMPRESSION: Ultrasound guided paracentesis performed for diagnostic purposes. 180 ml of fluid was removed and sent for laboratory analysis.   Original Report Authenticated By: Irish Lack, M.D.     I have reviewed the patient'[name] current medications.  Assessment/Plan: Problem #1 acute kidney injury her his BUN and creatinine presently stable and  patient is none oliguric presently the etiology could be prerenal syndrome versus ATN. Problem #2 possible chronic underlying renal failure. Patient is a chronically and some proteinuria. 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 surface antigen is negative, hepatitis C antibodies negative ANA is nonreactive and also HIV. The nephromegaly could be secondary to interstitial nephritis howeve secondary amyloidosis can give the same pattern. Problem #3 chronic liver disease with ascites is status post paracentesis. Problem #4 hypertension Problem #5 history of kidney stone is status post ESWL. Problem #6 history of pericardial effusion status post pericardial window placement. Problem #7 history of Crohn'[name] presently his on Remicade every 2 months. Plan: We'll do 24-hour urine for protein and immunoelectrophoresis. If patient is going to be discharged we'll follow him as an outpatient. Presently patient will like to go home. Since his renal function is stable to follow him as outpatient. I discussed with him possibly staying for another 24 hours to see some improvement in his renal function could be reasonable however patient will like to go and see his son playing..    LOS: 2 days   David [name] 04/13/2013,10:49 AM

## 2013-04-14 LAB — PROTEIN ELECTROPHORESIS, SERUM
Albumin ELP: 35.6 % — ABNORMAL LOW (ref 55.8–66.1)
Alpha-1-Globulin: 10 % — ABNORMAL HIGH (ref 2.9–4.9)
Alpha-2-Globulin: 11.5 % (ref 7.1–11.8)
Beta 2: 5.5 % (ref 3.2–6.5)
Beta Globulin: 7 % (ref 4.7–7.2)
Gamma Globulin: 30.4 % — ABNORMAL HIGH (ref 11.1–18.8)
M-Spike, %: NOT DETECTED g/dL
Total Protein ELP: 6.9 g/dL (ref 6.0–8.3)

## 2013-04-14 LAB — IMMUNOFIXATION ELECTROPHORESIS
IgA: 339 mg/dL (ref 68–379)
IgG (Immunoglobin G), Serum: 1950 mg/dL — ABNORMAL HIGH (ref 650–1600)
IgM, Serum: 169 mg/dL (ref 41–251)
Total Protein ELP: 6.4 g/dL (ref 6.0–8.3)

## 2013-04-14 LAB — ANCA SCREEN W REFLEX TITER
Atypical p-ANCA Screen: NEGATIVE
c-ANCA Screen: POSITIVE — AB
p-ANCA Screen: NEGATIVE

## 2013-04-17 LAB — CULTURE, BODY FLUID W GRAM STAIN -BOTTLE

## 2013-04-20 ENCOUNTER — Telehealth (INDEPENDENT_AMBULATORY_CARE_PROVIDER_SITE_OTHER): Payer: Self-pay | Admitting: *Deleted

## 2013-04-20 NOTE — Telephone Encounter (Signed)
Discussed with Dr.Rehman. He will review this prior to making a decision. Quantavious called and made aware.

## 2013-04-20 NOTE — Telephone Encounter (Signed)
David [name] called and states that his wife,David [name] , has lost her job. Therefore they are loosing their Insurance. It is time for his pain medication to be refilled, last written 03/22/13. The Opana will cost him $265.00 per prescription with no insurance. David [name] was just wondering , since the Oxycodone is cheaper for him , if this may be increased? Patient advised that this would be discussed with Dr.Rehman and he would be called back. Dr.Rehman was paged.

## 2013-04-21 ENCOUNTER — Telehealth (INDEPENDENT_AMBULATORY_CARE_PROVIDER_SITE_OTHER): Payer: Self-pay | Admitting: *Deleted

## 2013-04-21 NOTE — Telephone Encounter (Signed)
David [name] would like for Dr. Karilyn Cota call him at [phone]. This is in regards to the phone message sent to you yesterday, 04/20/13, by [name].

## 2013-04-24 ENCOUNTER — Other Ambulatory Visit (INDEPENDENT_AMBULATORY_CARE_PROVIDER_SITE_OTHER): Payer: Self-pay | Admitting: Internal Medicine

## 2013-04-24 ENCOUNTER — Ambulatory Visit (HOSPITAL_COMMUNITY): Payer: Medicare Other

## 2013-04-24 MED ORDER — OXYCODONE HCL 10 MG PO TABS: 20.0000 mg | ORAL_TABLET | Freq: Three times a day (TID) | ORAL | Status: DC

## 2013-04-24 NOTE — Telephone Encounter (Signed)
Script given to patient

## 2013-04-24 NOTE — Telephone Encounter (Signed)
Talk with patient; He cannot afford Opana. Will talk with his pharmacist about alternatives

## 2013-04-25 LAB — ANCA TITERS: C-ANCA: 1:40 {titer} — ABNORMAL HIGH

## 2013-04-26 ENCOUNTER — Ambulatory Visit (HOSPITAL_COMMUNITY): Payer: Medicare Other | Attending: Cardiology

## 2013-04-26 ENCOUNTER — Other Ambulatory Visit (INDEPENDENT_AMBULATORY_CARE_PROVIDER_SITE_OTHER): Payer: Self-pay | Admitting: Internal Medicine

## 2013-04-26 ENCOUNTER — Telehealth (INDEPENDENT_AMBULATORY_CARE_PROVIDER_SITE_OTHER): Payer: Self-pay | Admitting: *Deleted

## 2013-04-26 DIAGNOSIS — R160 Hepatomegaly, not elsewhere classified: Secondary | ICD-10-CM

## 2013-04-26 DIAGNOSIS — Z0189 Encounter for other specified special examinations: Secondary | ICD-10-CM

## 2013-04-26 DIAGNOSIS — K760 Fatty (change of) liver, not elsewhere classified: Secondary | ICD-10-CM

## 2013-04-26 NOTE — Telephone Encounter (Signed)
Christena Deem called on 04-25-13 and ask about Dr.Rehman preceding with the MRI/Liver . He had told Aundrea that it would need to be repeated in 6 months, then the other Dr.per Kylin is wanting MRI done also,and Ngai would appreciate if we could arrange it here for him. Per Dr. Karilyn Cota we can do this. I called Amarie to let him know that you would contact him

## 2013-04-26 NOTE — Telephone Encounter (Signed)
MRI sch'd 05/03/13 at (900 ), NPO after midnight, patient aware

## 2013-04-26 NOTE — Telephone Encounter (Signed)
Lab ordered and faxed to [facility] 

## 2013-04-27 ENCOUNTER — Encounter (HOSPITAL_COMMUNITY): Payer: Medicare Other

## 2013-04-27 DIAGNOSIS — K50813 Crohn's disease of both small and large intestine with fistula: Secondary | ICD-10-CM

## 2013-04-27 DIAGNOSIS — D649 Anemia, unspecified: Secondary | ICD-10-CM | POA: Insufficient documentation

## 2013-04-27 DIAGNOSIS — K5080 Crohn's disease of both small and large intestine without complications: Secondary | ICD-10-CM | POA: Insufficient documentation

## 2013-04-27 MED ORDER — ACETAMINOPHEN 500 MG PO TABS: 1000.0000 mg | ORAL_TABLET | Freq: Once | ORAL | Status: DC

## 2013-04-27 MED ORDER — LORATADINE 10 MG PO TABS: 10.0000 mg | ORAL_TABLET | Freq: Every day | ORAL | Status: DC

## 2013-04-27 MED ORDER — SODIUM CHLORIDE 0.9 % IV SOLN
INTRAVENOUS | Status: DC
  Administered 2013-04-27: 250 mL via INTRAVENOUS

## 2013-04-27 MED ORDER — INFLIXIMAB 100 MG IV SOLR
500.0000 mg | Freq: Once | INTRAVENOUS | Status: AC
  Administered 2013-04-27: 500 mg via INTRAVENOUS
  Filled 2013-04-27: qty 50

## 2013-04-27 NOTE — Progress Notes (Signed)
David [name] tolerated infusion well and without incident; verbalizes understanding for follow-up.  No distress noted at time of discharge and patient was discharged home with his mother.

## 2013-05-01 ENCOUNTER — Encounter (INDEPENDENT_AMBULATORY_CARE_PROVIDER_SITE_OTHER): Payer: Self-pay | Admitting: Internal Medicine

## 2013-05-01 ENCOUNTER — Ambulatory Visit (INDEPENDENT_AMBULATORY_CARE_PROVIDER_SITE_OTHER): Payer: Medicare Other | Admitting: Internal Medicine

## 2013-05-01 VITALS — BP 130/70 | HR 76 | Temp 97.6°F | Resp 20 | Ht 71.0 in | Wt 211.7 lb

## 2013-05-01 DIAGNOSIS — R188 Other ascites: Secondary | ICD-10-CM

## 2013-05-01 DIAGNOSIS — D509 Iron deficiency anemia, unspecified: Secondary | ICD-10-CM

## 2013-05-01 DIAGNOSIS — K5090 Crohn's disease, unspecified, without complications: Secondary | ICD-10-CM

## 2013-05-01 DIAGNOSIS — R109 Unspecified abdominal pain: Secondary | ICD-10-CM

## 2013-05-01 DIAGNOSIS — K219 Gastro-esophageal reflux disease without esophagitis: Secondary | ICD-10-CM

## 2013-05-01 MED ORDER — PANTOPRAZOLE SODIUM 40 MG PO TBEC: 40.0000 mg | DELAYED_RELEASE_TABLET | Freq: Two times a day (BID) | ORAL | Status: DC

## 2013-05-01 NOTE — Progress Notes (Signed)
Presenting complaint;  Followup for Crohn's disease and cirrhosis. Patient complains of frequent regurgitation and lower abdominal pain.  Subjective:  Patient is 37 year old Caucasian male with multiple medical problems who presents for scheduled visit. He was last seen 2 months ago in the office but seen in the hospital 3 weeks ago when he was admitted for gross hematuria and new onset of ascites and underwent abdominal paracenteses in SBP was ruled out. He also had elevated serum creatinine. About 2 weeks ago he was seen by Dr. Julieta Gutting of [facility] hepatology and he is scheduled for MRI of the liver. Patient now presents with a regurgitation both during the daytime and at night; this symptoms started about a month ago. Few nights he had to sleep propped up because of regurgitation. He's not experiencing heartburn very often. He is still smoking cigarettes usually couple a day. He smokes when he is nervous. He is having 1-3 bowel movements per day. Stool consistency varies. He has occasional hematochezia in the form of blood on the tissue but no frank rectal bleeding or melena reported. He had a low-grade temp one week ago. He also complains of fullness and pain across lower abdomen and postprandial nausea without vomiting. He has gained about 10 pounds in the last 3 weeks.  Current Medications: Current Outpatient Prescriptions  Medication Sig Dispense Refill  . aspirin 81 MG EC tablet Take 1 tablet (81 mg total) by mouth daily.  30 tablet    . budesonide (ENTOCORT EC) 3 MG 24 hr capsule Take 6 mg by mouth daily.       . calcium carbonate (OS-CAL) 600 MG TABS Take 600 mg by mouth 2 (two) times daily with a meal.      . colchicine 0.6 MG tablet Take 1 tablet (0.6 mg total) by mouth 2 (two) times daily.  60 tablet  1  . dicyclomine (BENTYL) 10 MG capsule Take 1 capsule (10 mg total) by mouth 3 (three) times daily as needed.  90 capsule  5  . ferrous sulfate 325 (65 FE) MG tablet Take 325 mg by mouth 2  (two) times daily.      . fish oil-omega-3 fatty acids 1000 MG capsule Take 1 g by mouth 3 (three) times daily.      . furosemide (LASIX) 40 MG tablet Take 40 mg by mouth 2 (two) times daily.       . Multiple Vitamins-Minerals (MULTIVITAMINS THER. W/MINERALS) TABS Take 1 tablet by mouth 2 (two) times daily.       . Oxycodone HCl 10 MG TABS Take 2 tablets (20 mg total) by mouth 3 (three) times daily.  180 tablet  0  . pantoprazole (PROTONIX) 40 MG tablet Take 1 tablet (40 mg total) by mouth daily.  30 tablet  4  . potassium chloride (K-DUR) 10 MEQ tablet Take 20 mEq by mouth daily.       . Probiotic Product (ALIGN) 4 MG CAPS Take 4 mg by mouth daily.       Marland Kitchen spironolactone (ALDACTONE) 50 MG tablet Take 50 mg by mouth daily.       . sucralfate (CARAFATE) 1 G tablet Take 1 tablet (1 g total) by mouth 4 (four) times daily.  120 tablet  2  . ursodiol (ACTIGALL) 300 MG capsule Take 2 capsules (600 mg total) by mouth 2 (two) times daily.  120 capsule  1  . clonazePAM (KLONOPIN) 0.5 MG tablet Take 0.5 mg by mouth 2 (two) times daily as needed  for anxiety.      Marland Kitchen oxyCODONE (OXYCONTIN) 20 MG 12 hr tablet Take 1 tablet (20 mg total) by mouth 5 (five) times daily.  40 tablet  0   No current facility-administered medications for this visit.     Objective: Blood pressure 130/70, pulse 76, temperature 97.6 F (36.4 C), temperature source Oral, resp. rate 20, height 5\' 11"  (1.803 m), weight 211 lb 11.2 oz (96.026 kg). Patient is alert and in no acute distress. Conjunctiva is somewhat pale. Sclera is nonicteric Oropharyngeal mucosa is normal. No neck masses or thyromegaly noted. Cardiac exam with regular rhythm normal S1 and S2. No murmur, rub or gallop noted. Lungs are clear to auscultation. Abdomen is full. Bowel sounds are normal. He has mild to moderate tenderness across lower half of the abdomen more so in the right lower quadrant. Liver edge is firm. Spleen is not palpable. No LE edema or clubbing  noted.   Assessment:  #1. 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 His disease is complicated by ascites. Hepatic function has deteriorated rapidly and he is being evaluated for OLT at [facility]. Dr. Georgetta Haber she was also concerned that he may have PSC given IBD but biopsy in August 2012 did not show any such changes. MRI of liver is pending.  #2.Ileorectal Crohn's disease. he is on Remicade infusion and also in budesonide but remains symptomatic. #3.Frequent regurgitation. 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 These symptoms may also be secondary to small bowel stricture. He had small bowel study in January 2014 and he did not have dilated small bowel or distal stricture. If he does not respond to therapy we'll consider upper GI and small bowel follow-through. #4. Iron deficiency anemia. #5 .elevated serum creatinine most likely secondary to intravascular depletion secondary to dehydration.he is under care of Dr. Fausto Skillern.   Plan:  Eat 6 small meals daily and try to eat evening meal 3 hours before going to bed. Take dicyclomine only on when necessary basis. Discontinue Urso as questionable benefit. Increase pantoprazole to 40 mg by mouth twice a day. Patient will go to the lab for CBC, metabolic 7 and CRP. He will proceed with MRI of liver as planned. Office visit in 2 months

## 2013-05-01 NOTE — Patient Instructions (Signed)
Please note pantoprazole is twice daily; take 40 mg by mouth 30 minutes before breakfast and evening meal every day. Eat 6 small meals rather than 3 meals per day. Patient will contact her with results of blood work.

## 2013-05-03 ENCOUNTER — Encounter (HOSPITAL_COMMUNITY): Payer: Self-pay

## 2013-05-03 ENCOUNTER — Ambulatory Visit (HOSPITAL_COMMUNITY)
Admission: RE | Admit: 2013-05-03 | Discharge: 2013-05-03 | Disposition: A | Discharge status: Home / Self Care | Payer: Medicare Other | Source: Ambulatory Visit | Attending: Internal Medicine | Admitting: Internal Medicine

## 2013-05-03 ENCOUNTER — Other Ambulatory Visit (INDEPENDENT_AMBULATORY_CARE_PROVIDER_SITE_OTHER): Payer: Self-pay | Admitting: Internal Medicine

## 2013-05-03 DIAGNOSIS — K760 Fatty (change of) liver, not elsewhere classified: Secondary | ICD-10-CM

## 2013-05-03 DIAGNOSIS — K7689 Other specified diseases of liver: Secondary | ICD-10-CM | POA: Insufficient documentation

## 2013-05-03 DIAGNOSIS — R160 Hepatomegaly, not elsewhere classified: Secondary | ICD-10-CM

## 2013-05-03 DIAGNOSIS — R188 Other ascites: Secondary | ICD-10-CM | POA: Insufficient documentation

## 2013-05-03 LAB — BASIC METABOLIC PANEL WITH GFR
Calcium: 8.2 mg/dL — ABNORMAL LOW (ref 8.4–10.5)
Chloride: 104 mEq/L (ref 96–112)
Creat: 1.14 mg/dL (ref 0.50–1.35)
GFR, Est Non African American: 82 mL/min
Sodium: 139 mEq/L (ref 135–145)

## 2013-05-03 LAB — CBC
MCH: 24.7 pg — ABNORMAL LOW (ref 26.0–34.0)
MCV: 77.7 fL — ABNORMAL LOW (ref 78.0–100.0)
Platelets: 149 10*3/uL — ABNORMAL LOW (ref 150–400)
RBC: 2.87 MIL/uL — ABNORMAL LOW (ref 4.22–5.81)
RDW: 20.8 % — ABNORMAL HIGH (ref 11.5–15.5)
WBC: 7.3 10*3/uL (ref 4.0–10.5)

## 2013-05-03 LAB — C-REACTIVE PROTEIN: CRP: 1.4 mg/dL — ABNORMAL HIGH (ref <0.60)

## 2013-05-03 MED ORDER — GADOBENATE DIMEGLUMINE 529 MG/ML IV SOLN
20.0000 mL | Freq: Once | INTRAVENOUS | Status: AC | PRN
  Administered 2013-05-03: 20 mL via INTRAVENOUS

## 2013-05-04 ENCOUNTER — Ambulatory Visit (HOSPITAL_COMMUNITY): Payer: Medicare Other

## 2013-05-04 ENCOUNTER — Encounter (HOSPITAL_COMMUNITY): Payer: Medicare Other | Attending: Internal Medicine

## 2013-05-04 DIAGNOSIS — D649 Anemia, unspecified: Secondary | ICD-10-CM

## 2013-05-04 LAB — HEMOGLOBIN AND HEMATOCRIT, BLOOD
HCT: 25.5 % — ABNORMAL LOW (ref 39.0–52.0)
Hemoglobin: 8.2 g/dL — ABNORMAL LOW (ref 13.0–17.0)

## 2013-05-04 NOTE — Addendum Note (Signed)
Addended by: Dennie Maizes on: 05/04/2013 11:54 AM   Modules accepted: Orders

## 2013-05-04 NOTE — Progress Notes (Signed)
Labs drawn today for type and cross 

## 2013-05-05 ENCOUNTER — Encounter (HOSPITAL_COMMUNITY): Payer: Medicare Other

## 2013-05-05 VITALS — BP 108/63 | HR 84 | Temp 98.4°F | Resp 18

## 2013-05-05 DIAGNOSIS — D649 Anemia, unspecified: Secondary | ICD-10-CM

## 2013-05-05 DIAGNOSIS — K50813 Crohn's disease of both small and large intestine with fistula: Secondary | ICD-10-CM

## 2013-05-05 LAB — HEMOGLOBIN AND HEMATOCRIT, BLOOD
HCT: 25.7 % — ABNORMAL LOW (ref 39.0–52.0)
Hemoglobin: 8.5 g/dL — ABNORMAL LOW (ref 13.0–17.0)

## 2013-05-05 MED ORDER — DIPHENHYDRAMINE HCL 25 MG PO CAPS
ORAL_CAPSULE | ORAL | Status: AC
  Filled 2013-05-05: qty 1

## 2013-05-05 MED ORDER — ACETAMINOPHEN 325 MG PO TABS
650.0000 mg | ORAL_TABLET | Freq: Once | ORAL | Status: AC
  Administered 2013-05-05: 650 mg via ORAL

## 2013-05-05 MED ORDER — DIPHENHYDRAMINE HCL 25 MG PO CAPS
25.0000 mg | ORAL_CAPSULE | Freq: Once | ORAL | Status: AC
  Administered 2013-05-05: 25 mg via ORAL

## 2013-05-05 MED ORDER — ACETAMINOPHEN 325 MG PO TABS
ORAL_TABLET | ORAL | Status: AC
  Filled 2013-05-05: qty 2

## 2013-05-05 MED ORDER — SODIUM CHLORIDE 0.9 % IV SOLN
250.0000 mL | Freq: Once | INTRAVENOUS | Status: AC
  Administered 2013-05-05: 250 mL via INTRAVENOUS

## 2013-05-05 MED ORDER — FUROSEMIDE 10 MG/ML IJ SOLN
20.0000 mg | Freq: Once | INTRAMUSCULAR | Status: AC
  Administered 2013-05-05: 20 mg via INTRAVENOUS

## 2013-05-05 MED ORDER — FUROSEMIDE 10 MG/ML IJ SOLN
INTRAMUSCULAR | Status: AC
  Filled 2013-05-05: qty 2

## 2013-05-05 NOTE — Progress Notes (Signed)
Tolerated well. postt h&h drawn.

## 2013-05-06 LAB — TYPE AND SCREEN
ABO/RH(D): A POS
Unit division: 0

## 2013-05-09 ENCOUNTER — Other Ambulatory Visit (INDEPENDENT_AMBULATORY_CARE_PROVIDER_SITE_OTHER): Payer: Self-pay | Admitting: *Deleted

## 2013-05-09 DIAGNOSIS — K5090 Crohn's disease, unspecified, without complications: Secondary | ICD-10-CM

## 2013-05-09 DIAGNOSIS — K219 Gastro-esophageal reflux disease without esophagitis: Secondary | ICD-10-CM

## 2013-05-09 MED ORDER — PANTOPRAZOLE SODIUM 40 MG PO TBEC: 40.0000 mg | DELAYED_RELEASE_TABLET | Freq: Two times a day (BID) | ORAL | Status: DC

## 2013-05-09 MED ORDER — BUDESONIDE 3 MG PO CP24: 9.0000 mg | ORAL_CAPSULE | Freq: Every day | ORAL | Status: DC

## 2013-05-09 NOTE — Telephone Encounter (Signed)
Patient called the office and requested that we sesnd refill request for these two medications

## 2013-05-10 ENCOUNTER — Telehealth (INDEPENDENT_AMBULATORY_CARE_PROVIDER_SITE_OTHER): Payer: Self-pay | Admitting: *Deleted

## 2013-05-10 DIAGNOSIS — D649 Anemia, unspecified: Secondary | ICD-10-CM

## 2013-05-10 NOTE — Telephone Encounter (Signed)
Per Dr.Rehman the patient will need to have labs drawn in 2 weeks. 

## 2013-05-11 ENCOUNTER — Encounter (INDEPENDENT_AMBULATORY_CARE_PROVIDER_SITE_OTHER): Payer: Self-pay | Admitting: *Deleted

## 2013-05-11 ENCOUNTER — Other Ambulatory Visit (INDEPENDENT_AMBULATORY_CARE_PROVIDER_SITE_OTHER): Payer: Self-pay | Admitting: *Deleted

## 2013-05-11 DIAGNOSIS — D649 Anemia, unspecified: Secondary | ICD-10-CM

## 2013-05-22 ENCOUNTER — Other Ambulatory Visit (INDEPENDENT_AMBULATORY_CARE_PROVIDER_SITE_OTHER): Payer: Self-pay | Admitting: *Deleted

## 2013-05-22 DIAGNOSIS — R109 Unspecified abdominal pain: Secondary | ICD-10-CM

## 2013-05-22 DIAGNOSIS — K50918 Crohn's disease, unspecified, with other complication: Secondary | ICD-10-CM

## 2013-05-22 NOTE — Telephone Encounter (Signed)
Per the Pharmacy the patient is due to have this drawn on 05-24-13 , as there was 31 days in last month. Patient did ask if he could pick up on Wednesday and have it filled on Thursday Patient will be called when prescription is ready

## 2013-05-23 MED ORDER — OXYCODONE HCL 10 MG PO TABS: 20.0000 mg | ORAL_TABLET | Freq: Three times a day (TID) | ORAL | Status: DC

## 2013-05-26 LAB — AFB CULTURE WITH SMEAR (NOT AT ARMC)

## 2013-06-02 ENCOUNTER — Ambulatory Visit: Payer: Medicare Other | Admitting: Cardiology

## 2013-06-08 ENCOUNTER — Ambulatory Visit (HOSPITAL_COMMUNITY): Payer: Medicare Other

## 2013-06-09 ENCOUNTER — Encounter: Payer: Self-pay | Admitting: Cardiology

## 2013-06-09 ENCOUNTER — Ambulatory Visit (INDEPENDENT_AMBULATORY_CARE_PROVIDER_SITE_OTHER): Payer: Medicare Other | Admitting: Cardiology

## 2013-06-09 VITALS — BP 114/79 | HR 91 | Ht 71.0 in | Wt 204.0 lb

## 2013-06-09 DIAGNOSIS — J90 Pleural effusion, not elsewhere classified: Secondary | ICD-10-CM

## 2013-06-09 DIAGNOSIS — R609 Edema, unspecified: Secondary | ICD-10-CM

## 2013-06-09 DIAGNOSIS — I10 Essential (primary) hypertension: Secondary | ICD-10-CM

## 2013-06-09 DIAGNOSIS — R188 Other ascites: Secondary | ICD-10-CM

## 2013-06-09 DIAGNOSIS — R600 Localized edema: Secondary | ICD-10-CM

## 2013-06-09 DIAGNOSIS — I319 Disease of pericardium, unspecified: Secondary | ICD-10-CM

## 2013-06-09 NOTE — Progress Notes (Signed)
HPI David [name] returns today for evaluation and management of his history of inflammatory pericarditis in December of 2013. He is status post impending tamponade at that time. He underwent pericardial drain and biopsy.  He has had no recurrence since then on colcichine. We have had to be careful with nonsteroidals because of his renal and kidney disease. He continues to take low-dose aspirin.  He has developed impending liver failure and is being seen at [facility] for potential transplant in the future. He was recently admitted in April for acute renal failure. At that time a 2-D echocardiogram was done which was essentially normal with no pericardial fluid. He denies any chest discomfort. EKG is normal today. He is taking spironolactone daily and Lasix when necessary for edema. He does have ascites from his liver disease. This was recently tapped.   Past Medical History  Diagnosis Date  . 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 Hepatomegaly 05/04/2011  . Fatty liver 05/04/2011    "stage III fatty liver fibrosis" (11/15/2012)  . GERD (gastroesophageal reflux disease)   . Chronic liver disease     /notes 11/15/2012  . Pericarditis     [name] 11/15/2012  . Hypertension   . Pneumonia 1977  . Shortness of breath     "all the time right now" (11/15/2012)  . History of blood transfusion 2004  . History of stomach ulcers   . Duodenal ulcer   . Depression   . Kidney stones     bilaterally/notes 11/15/2012  . Hepatic fibrosis     [name] 11/15/2012  . Anemia   . Pericardial effusion 10/29/2012    moderate to large/notes 11/15/2012  . Anxiety   . Hepatitis   . Crohn's disease   . ED (erectile dysfunction)     Current Outpatient Prescriptions  Medication Sig Dispense Refill  . aspirin 81 MG EC tablet Take 1 tablet (81 mg total) by mouth daily.  30 tablet    . budesonide (ENTOCORT EC) 3 MG 24 hr capsule Take 3 capsules  (9 mg total) by mouth daily.  30 capsule  1  . calcium carbonate (OS-CAL) 600 MG TABS Take 600 mg by mouth 2 (two) times daily with a meal.      . clonazePAM (KLONOPIN) 0.5 MG tablet Take 0.5 mg by mouth 2 (two) times daily as needed for anxiety.      . colchicine 0.6 MG tablet Take 1 tablet (0.6 mg total) by mouth 2 (two) times daily.  60 tablet  1  . dicyclomine (BENTYL) 10 MG capsule Take 1 capsule (10 mg total) by mouth 3 (three) times daily as needed.  90 capsule  5  . ferrous sulfate 325 (65 FE) MG tablet Take 325 mg by mouth 2 (two) times daily.      . fish oil-omega-3 fatty acids 1000 MG capsule Take 1 g by mouth 3 (three) times daily.      . furosemide (LASIX) 40 MG tablet Take 40 mg by mouth daily as needed.       . Multiple Vitamins-Minerals (MULTIVITAMINS THER. W/MINERALS) TABS Take 1 tablet by mouth 2 (two) times daily.       . Oxycodone HCl 10 MG TABS Take 2 tablets (20 mg total) by mouth 3 (three) times daily.  180 tablet  0  . pantoprazole (PROTONIX) 40 MG tablet Take 1 tablet (40 mg total) by mouth 2 (two) times daily.  60 tablet  5  . potassium chloride (K-DUR) 10 MEQ tablet Take 20 mEq by mouth daily.       . Probiotic Product (ALIGN) 4 MG CAPS Take 4 mg by mouth daily.       Marland Kitchen spironolactone (ALDACTONE) 50 MG tablet Take 50 mg by mouth daily.       Marland Kitchen oxyCODONE (OXYCONTIN) 20 MG 12 hr tablet Take 1 tablet (20 mg total) by mouth 5 (five) times daily.  40 tablet  0   No current facility-administered medications for this visit.    Allergies  Allergen Reactions  . Wellbutrin (Bupropion)     Side effects  . Morphine And Related Hives    Family History  Problem Relation Age of Onset  . Diabetes Mother   . Diabetes Brother   . Healthy Daughter   . Healthy Son     History   Social History  . Marital Status: Married    Spouse Name: N/A    Number of Children: 2  . Years of Education: N/A   Occupational History  . disabled/retired    Social History Main Topics  .  Smoking status: Former Smoker -- 0.50 packs/day for 24 years    Types: Cigarettes    Start date: 10/28/2012    Quit date: 11/22/2012  . Smokeless tobacco: Current User    Types: Snuff     Comment: 1/2 pack a month  . Alcohol Use: No  . Drug Use: No  . Sexually Active: Not Currently   Other Topics Concern  . Not on file   Social History Narrative  . No narrative on file    ROS ALL NEGATIVE EXCEPT THOSE NOTED IN HPI  PE  General Appearance: well developed, well nourished in no acute distress HEENT: symmetrical face, PERRLA, good dentition  Neck: no JVD, thyromegaly, or adenopathy, trachea midline Chest: symmetric without deformity Cardiac: PMI non-displaced, RRR, normal S1, S2, no gallop or murmur, no rub Lung: clear to ausculation and percussion Vascular: all pulses full without bruits  Abdominal: Distended, nontender, good bowel sounds, no HSM, no bruits Extremities: no cyanosis, clubbing , minimal edema, no sign of DVT, no varicosities  Skin: normal color, no rashes Neuro: alert and oriented x 3, non-focal Pysch: normal affect  EKG Normal sinus rhythm, normal EKG BMET    Component Value Date/Time   NA 139 05/02/2013 1425   K 4.1 05/02/2013 1425   CL 104 05/02/2013 1425   CO2 25 05/02/2013 1425   GLUCOSE 134* 05/02/2013 1425   BUN 14 05/02/2013 1425   CREATININE 1.14 05/02/2013 1425   CREATININE 2.39* 04/13/2013 0445   CALCIUM 8.2* 05/02/2013 1425   GFRNONAA 33* 04/13/2013 0445   GFRAA 39* 04/13/2013 0445    Lipid Panel  No results found for this basename: chol, trig, hdl, cholhdl, vldl, ldlcalc    CBC    Component Value Date/Time   WBC 7.3 05/02/2013 1425   RBC 2.87* 05/02/2013 1425   HGB 8.5* 05/05/2013 1504   HCT 25.7* 05/05/2013 1504   PLT 149* 05/02/2013 1425   MCV 77.7* 05/02/2013 1425   MCH 24.7* 05/02/2013 1425   MCHC 31.8 05/02/2013 1425   RDW 20.8* 05/02/2013 1425   LYMPHSABS 0.8 04/11/2013 1033   MONOABS 0.6 04/11/2013 1033   EOSABS 0.1 04/11/2013 1033    BASOSABS 0.0 04/11/2013 1033

## 2013-06-09 NOTE — Patient Instructions (Addendum)
Your physician recommends that you schedule a follow-up appointment in: AS NEEDED  Your physician has recommended you make the following change in your medication:   1) STOP TAKING ASPIRIN 2) STOP TAKING COLCHICINE

## 2013-06-09 NOTE — Assessment & Plan Note (Signed)
This has resolved. At this point we'll stop his low-dose aspirin as well as his colcichine. He will continue with when necessary Lasix and spironolactone for his edema and ascites which is noncardiogenic. We will see him back when necessary. As I discussed with him today the risk of recurrence is extremely low but not unheard of. He understands.

## 2013-06-13 ENCOUNTER — Encounter (HOSPITAL_COMMUNITY): Payer: Medicare Other

## 2013-06-13 ENCOUNTER — Emergency Department (HOSPITAL_BASED_OUTPATIENT_CLINIC_OR_DEPARTMENT_OTHER): Payer: Medicare Other | Admitting: Oncology

## 2013-06-13 ENCOUNTER — Emergency Department (HOSPITAL_COMMUNITY)
Admission: EM | Admit: 2013-06-13 | Discharge: 2013-06-13 | Disposition: A | Discharge status: Home / Self Care | Payer: Medicare Other | Attending: Emergency Medicine | Admitting: Emergency Medicine

## 2013-06-13 ENCOUNTER — Encounter (HOSPITAL_COMMUNITY): Payer: Self-pay | Admitting: Emergency Medicine

## 2013-06-13 ENCOUNTER — Emergency Department (HOSPITAL_COMMUNITY): Payer: Medicare Other

## 2013-06-13 VITALS — BP 122/76 | HR 99 | Temp 98.3°F | Resp 32

## 2013-06-13 DIAGNOSIS — Z8679 Personal history of other diseases of the circulatory system: Secondary | ICD-10-CM | POA: Insufficient documentation

## 2013-06-13 DIAGNOSIS — K5080 Crohn's disease of both small and large intestine without complications: Secondary | ICD-10-CM | POA: Insufficient documentation

## 2013-06-13 DIAGNOSIS — M545 Low back pain, unspecified: Secondary | ICD-10-CM | POA: Insufficient documentation

## 2013-06-13 DIAGNOSIS — Z8701 Personal history of pneumonia (recurrent): Secondary | ICD-10-CM | POA: Insufficient documentation

## 2013-06-13 DIAGNOSIS — R509 Fever, unspecified: Secondary | ICD-10-CM | POA: Insufficient documentation

## 2013-06-13 DIAGNOSIS — R079 Chest pain, unspecified: Secondary | ICD-10-CM | POA: Insufficient documentation

## 2013-06-13 DIAGNOSIS — R109 Unspecified abdominal pain: Secondary | ICD-10-CM | POA: Insufficient documentation

## 2013-06-13 DIAGNOSIS — K269 Duodenal ulcer, unspecified as acute or chronic, without hemorrhage or perforation: Secondary | ICD-10-CM | POA: Insufficient documentation

## 2013-06-13 DIAGNOSIS — M549 Dorsalgia, unspecified: Secondary | ICD-10-CM | POA: Insufficient documentation

## 2013-06-13 DIAGNOSIS — F329 Major depressive disorder, single episode, unspecified: Secondary | ICD-10-CM | POA: Insufficient documentation

## 2013-06-13 DIAGNOSIS — K50813 Crohn's disease of both small and large intestine with fistula: Secondary | ICD-10-CM

## 2013-06-13 DIAGNOSIS — I10 Essential (primary) hypertension: Secondary | ICD-10-CM | POA: Insufficient documentation

## 2013-06-13 DIAGNOSIS — R61 Generalized hyperhidrosis: Secondary | ICD-10-CM | POA: Insufficient documentation

## 2013-06-13 DIAGNOSIS — F411 Generalized anxiety disorder: Secondary | ICD-10-CM | POA: Insufficient documentation

## 2013-06-13 DIAGNOSIS — K259 Gastric ulcer, unspecified as acute or chronic, without hemorrhage or perforation: Secondary | ICD-10-CM | POA: Insufficient documentation

## 2013-06-13 DIAGNOSIS — T782XXA Anaphylactic shock, unspecified, initial encounter: Secondary | ICD-10-CM

## 2013-06-13 DIAGNOSIS — F3289 Other specified depressive episodes: Secondary | ICD-10-CM | POA: Insufficient documentation

## 2013-06-13 DIAGNOSIS — Z87448 Personal history of other diseases of urinary system: Secondary | ICD-10-CM | POA: Insufficient documentation

## 2013-06-13 DIAGNOSIS — Z79899 Other long term (current) drug therapy: Secondary | ICD-10-CM | POA: Insufficient documentation

## 2013-06-13 DIAGNOSIS — Z8719 Personal history of other diseases of the digestive system: Secondary | ICD-10-CM | POA: Insufficient documentation

## 2013-06-13 DIAGNOSIS — Z87442 Personal history of urinary calculi: Secondary | ICD-10-CM | POA: Insufficient documentation

## 2013-06-13 DIAGNOSIS — F17200 Nicotine dependence, unspecified, uncomplicated: Secondary | ICD-10-CM | POA: Insufficient documentation

## 2013-06-13 DIAGNOSIS — Z862 Personal history of diseases of the blood and blood-forming organs and certain disorders involving the immune mechanism: Secondary | ICD-10-CM | POA: Insufficient documentation

## 2013-06-13 DIAGNOSIS — M2540 Effusion, unspecified joint: Secondary | ICD-10-CM | POA: Insufficient documentation

## 2013-06-13 DIAGNOSIS — K219 Gastro-esophageal reflux disease without esophagitis: Secondary | ICD-10-CM | POA: Insufficient documentation

## 2013-06-13 LAB — BASIC METABOLIC PANEL
BUN: 9 mg/dL (ref 6–23)
Chloride: 99 mEq/L (ref 96–112)
GFR calc Af Amer: >90 mL/min (ref >90)
GFR calc non Af Amer: >90 mL/min (ref >90)
Glucose, Bld: 108 mg/dL — ABNORMAL HIGH (ref 70–99)
Potassium: 4.8 mEq/L (ref 3.5–5.1)
Sodium: 134 mEq/L — ABNORMAL LOW (ref 135–145)

## 2013-06-13 MED ORDER — METHYLPREDNISOLONE SODIUM SUCC 125 MG IJ SOLR
125.0000 mg | Freq: Once | INTRAMUSCULAR | Status: AC
  Administered 2013-06-13: 125 mg via INTRAVENOUS

## 2013-06-13 MED ORDER — METHYLPREDNISOLONE SODIUM SUCC 125 MG IJ SOLR
INTRAMUSCULAR | Status: AC
  Filled 2013-06-13: qty 2

## 2013-06-13 MED ORDER — HYDROMORPHONE HCL PF 1 MG/ML IJ SOLN
0.5000 mg | Freq: Once | INTRAMUSCULAR | Status: AC
  Administered 2013-06-13: 0.5 mg via INTRAVENOUS

## 2013-06-13 MED ORDER — DIPHENHYDRAMINE HCL 50 MG/ML IJ SOLN
50.0000 mg | Freq: Once | INTRAMUSCULAR | Status: AC
  Administered 2013-06-13: 50 mg via INTRAVENOUS

## 2013-06-13 MED ORDER — FAMOTIDINE IN NACL 20-0.9 MG/50ML-% IV SOLN
20.0000 mg | Freq: Once | INTRAVENOUS | Status: AC
  Administered 2013-06-13: 20 mg via INTRAVENOUS

## 2013-06-13 MED ORDER — FAMOTIDINE IN NACL 20-0.9 MG/50ML-% IV SOLN
INTRAVENOUS | Status: AC
  Filled 2013-06-13: qty 50

## 2013-06-13 MED ORDER — SODIUM CHLORIDE 0.9 % IV SOLN
500.0000 mg | Freq: Once | INTRAVENOUS | Status: AC
  Administered 2013-06-13: 500 mg via INTRAVENOUS
  Filled 2013-06-13: qty 50

## 2013-06-13 MED ORDER — SODIUM CHLORIDE 0.9 % IV SOLN
INTRAVENOUS | Status: DC
  Administered 2013-06-13: 11:00:00 via INTRAVENOUS

## 2013-06-13 MED ORDER — HYDROCORTISONE SOD SUCCINATE 100 MG IJ SOLR: 125.0000 mg | Freq: Once | INTRAMUSCULAR | Status: DC

## 2013-06-13 MED ORDER — DIPHENHYDRAMINE HCL 50 MG/ML IJ SOLN
INTRAMUSCULAR | Status: AC
  Filled 2013-06-13: qty 1

## 2013-06-13 MED ORDER — HYDROMORPHONE HCL PF 1 MG/ML IJ SOLN
INTRAMUSCULAR | Status: AC
  Filled 2013-06-13: qty 1

## 2013-06-13 NOTE — ED Notes (Addendum)
Pt from cancer center. Pt had allergic reaction to remicade during chemo treatment this am. Pt received benadryl,solu-medrol during reaction.Post allergic reaction,pt v/s stable and reports severe lower back pain.Pt received .5mg  of dilaudid prior to arrival to ED. Pt has a hx of kidney stones. Pt alert and oriented.airway patent. Pt pale and diaphoretic. Pt denies any urinary symptoms at this time.Pt reporting pain 10/10 at this time.

## 2013-06-13 NOTE — ED Provider Notes (Signed)
History  This chart was scribed for Shelda Jakes, MD, by Yevette Edwards, ED Scribe. This patient was seen in room [facility] and the patient's care was started at 1:19 PM.  CSN: [id number] Arrival date & time 06/13/13  1223  First MD Initiated Contact with Patient 06/13/13 1317     Chief Complaint  Patient presents with  . Back Pain    The history is provided by the patient. No language interpreter was used.   HPI Comments: David [name] is a 37 y.o. male who presents to the Emergency Department complaining of throbbing and radiating lower, middle back pain, and which has since resolved to some degree. He describes the pain as a dull ache, and states that at its worse, it was a 10/10.  The pt states that the symptoms he has experienced are similar to prior episodes of kidney stones. He reports experiencing diaphoresis and feeling flushed as associated symptoms. He denies any dysuria, abdominal pain, or recent cold symptoms. He states that the back pain began after receiving IV fluids at the cancer center upstairs while being treated for Crohn's disease. The pt also has a h/o of kidney stones, Crohn's disease with fistula, and chronic liver disease. He is a daily smoker, but he denies alcohol use.   Past Medical History  Diagnosis Date  . 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 Hepatomegaly 05/04/2011  . Fatty liver 05/04/2011    "stage III fatty liver fibrosis" (11/15/2012)  . GERD (gastroesophageal reflux disease)   . Chronic liver disease     /notes 11/15/2012  . Pericarditis     [name] 11/15/2012  . Hypertension   . Pneumonia 1977  . Shortness of breath     "all the time right now" (11/15/2012)  . History of blood transfusion 2004  . History of stomach ulcers   . Duodenal ulcer   . Depression   . Kidney stones     bilaterally/notes 11/15/2012  . Hepatic fibrosis     [name] 11/15/2012  .  Anemia   . Pericardial effusion 10/29/2012    moderate to large/notes 11/15/2012  . Anxiety   . Hepatitis   . Crohn's disease   . ED (erectile dysfunction)    Past Surgical History  Procedure Laterality Date  . Anal examination under anesthesia  02/11/2011    [name]  . Treatment fistula anal  07/03/11    This was a second surgery to repair Anal fistula  . Small intestine surgery  2004    "had hole cut in small intestines during endoscopy; had to have OR & leave me open 3 months" (11/15/2012)  . Cholecystectomy  ~ 2003  . Appendectomy  ~ 2003  . Video assisted thoracoscopy  11/17/2012    Procedure: VIDEO ASSISTED THORACOSCOPY;  Surgeon: Loreli Slot, MD;  Location: [facility] OR;  Service: Thoracic;  Laterality: Left;  drainage of left pleural effusion  . Pericardial window  11/17/2012    Procedure: PERICARDIAL WINDOW;  Surgeon: Loreli Slot, MD;  Location: [facility] OR;  Service: Thoracic;  Laterality: N/A;  . Esophagogastroduodenoscopy  01/06/2013    Procedure: ESOPHAGOGASTRODUODENOSCOPY (EGD);  Surgeon: Malissa Hippo, MD;  Location: [facility];  Service: Endoscopy;  Laterality: N/A;  11:15   Family History  Problem Relation Age of Onset  . Diabetes Mother   . Diabetes Brother   . Healthy Daughter   . Healthy  Son    History  Substance Use Topics  . Smoking status: Light Tobacco Smoker -- 0.50 packs/day for 24 years    Types: Cigarettes    Start date: 10/28/2012    Last Attempt to Quit: 11/22/2012  . Smokeless tobacco: Current User    Types: Snuff     Comment: 1/2 pack a month  . Alcohol Use: No    Review of Systems  Constitutional: Positive for fever and diaphoresis. Negative for chills.  HENT: Negative for sore throat, rhinorrhea and neck pain.   Eyes: Negative for visual disturbance.  Respiratory: Negative for cough and shortness of breath.   Cardiovascular: Positive for chest pain.  Gastrointestinal: Positive for abdominal pain. Negative for nausea, vomiting and  diarrhea.  Genitourinary: Negative for dysuria and hematuria.  Musculoskeletal: Positive for back pain and joint swelling.  Skin: Negative for rash.  Neurological: Negative for headaches.  Hematological: Does not bruise/bleed easily.  Psychiatric/Behavioral: Negative for confusion.    Allergies  Remicade; Wellbutrin; and Morphine and related  Home Medications   Current Outpatient Rx  Name  Route  Sig  Dispense  Refill  . budesonide (ENTOCORT EC) 3 MG 24 hr capsule   Oral   Take 3 capsules (9 mg total) by mouth daily.   30 capsule   1   . calcium carbonate (OS-CAL) 600 MG TABS   Oral   Take 600 mg by mouth 2 (two) times daily with a meal.         . clonazePAM (KLONOPIN) 0.5 MG tablet   Oral   Take 0.5 mg by mouth 2 (two) times daily as needed for anxiety.         . dicyclomine (BENTYL) 10 MG capsule   Oral   Take 1 capsule (10 mg total) by mouth 3 (three) times daily as needed.   90 capsule   5   . ferrous sulfate 325 (65 FE) MG tablet   Oral   Take 325 mg by mouth 2 (two) times daily.         . fish oil-omega-3 fatty acids 1000 MG capsule   Oral   Take 1 g by mouth 3 (three) times daily.         . furosemide (LASIX) 40 MG tablet   Oral   Take 40 mg by mouth daily as needed for fluid.          . Multiple Vitamins-Minerals (MULTIVITAMINS THER. W/MINERALS) TABS   Oral   Take 1 tablet by mouth 2 (two) times daily.          . Oxycodone HCl 10 MG TABS   Oral   Take 2 tablets (20 mg total) by mouth 3 (three) times daily.   180 tablet   0   . pantoprazole (PROTONIX) 40 MG tablet   Oral   Take 1 tablet (40 mg total) by mouth 2 (two) times daily.   60 tablet   5     30 minutes before breakfast   . potassium chloride (K-DUR) 10 MEQ tablet   Oral   Take 20 mEq by mouth daily.          . Probiotic Product (ALIGN) 4 MG CAPS   Oral   Take 4 mg by mouth daily.          Marland Kitchen spironolactone (ALDACTONE) 50 MG tablet   Oral   Take 50 mg by mouth  daily.           Triage  Vitals: BP 113/79  Pulse 91  Temp(Src) 98.5 F (36.9 C) (Oral)  Resp 24  Ht 5\' 11"  (1.803 m)  Wt 201 lb (91.173 kg)  BMI 28.05 kg/m2  SpO2 100%  Physical Exam  Nursing note and vitals reviewed. Constitutional: He is oriented to person, place, and time. He appears well-developed and well-nourished.  Non-toxic appearance. He does not appear ill. No distress.  HENT:  Head: Normocephalic and atraumatic.  Right Ear: External ear normal.  Left Ear: External ear normal.  Nose: Nose normal. No mucosal edema or rhinorrhea.  Mouth/Throat: Oropharynx is clear and moist and mucous membranes are normal. No dental abscesses or edematous.  Eyes: Conjunctivae and EOM are normal. Pupils are equal, round, and reactive to light.  Neck: Normal range of motion and full passive range of motion without pain. Neck supple. No tracheal deviation present.  Cardiovascular: Normal rate, regular rhythm and normal heart sounds.   No murmur heard. Pulmonary/Chest: Effort normal and breath sounds normal. No respiratory distress. He has no wheezes. He has no rhonchi. He has no rales. He exhibits no crepitus.  Bilateral swelling in ankles.   Abdominal: Soft. Normal appearance and bowel sounds are normal.  Musculoskeletal: Normal range of motion. He exhibits edema. He exhibits no tenderness.  No CVA tenderness.  No spine tenderness.   Neurological: He is alert and oriented to person, place, and time. He has normal strength. No cranial nerve deficit. Coordination normal.  Skin: Skin is warm, dry and intact. No erythema.  Psychiatric: He has a normal mood and affect. His speech is normal and behavior is normal. His mood appears not anxious.     ED Course  Procedures (including critical care time)  DIAGNOSTIC STUDIES: Oxygen Saturation is 100% on room air, normal by my interpretation.    COORDINATION OF CARE:  1:22 PM- Discussed treatment plan with pt, and pt agreed.    Labs  Reviewed  BASIC METABOLIC PANEL - Abnormal; Notable for the following:    Sodium 134 (*)    Glucose, Bld 108 (*)    All other components within normal limits   Ct Abdomen Pelvis Wo Contrast  06/13/2013   *RADIOLOGY REPORT*  Clinical Data: Back pain.  Crohn's disease.  Hepatomegaly.  Hepatic fibrosis.  Hepatitis.  CT ABDOMEN AND PELVIS WITHOUT CONTRAST  Technique:  Multidetector CT imaging of the abdomen and pelvis was performed following the standard protocol without intravenous contrast.  Comparison: MRI 05/03/2013.  Most recent CT 04/11/2013.  Findings: Lung bases:  Clear lung bases.  Normal heart size without pericardial or pleural effusion.  Bilateral mild gynecomastia.  Abdomen/pelvis:  Moderate cirrhosis.  Splenomegaly, with the spleen measuring approximately 19.6 cm cranial caudal.  Normal stomach, pancreas.  Cholecystectomy, without biliary ductal dilatation.  Normal adrenal glands.  Upper pole right renal cyst.  Punctate interpolar right renal calculus is similar on coronal image 70. Ureters are difficult to follow, but there is no evidence of ureteric calculus or hydroureter identified.  Nonspecific narrowing at the low IVC on image 49. No retroperitoneal or retrocrural adenopathy.  The proximal sigmoid colon appears thick-walled on image 72/series 2.  Bowel loops otherwise suboptimally evaluated secondary stone study technique.  Grossly normal terminal ileum.  Normal small bowel caliber.  Diffuse omental and peritoneal edema is not significantly changed. Minimal ill-defined fluid within the left lateral aspect of the lesser sac on image 26/series 2.  This is unchanged.  The extent of left-sided loculated ascites is moderate and slightly decreased on  image 55/series 2.  Decreased small volume pelvic fluid primarily anteriorly on image 74.  No pelvic adenopathy.    Normal urinary bladder and prostate.  Bones/Musculoskeletal:  No acute osseous abnormality.  IMPRESSION:  1.  Punctate right renal  calculus, without urinary tract obstruction or ureteric stone. 2.  Otherwise, low sensitivity exam secondary to a stone study technique. 3.  Findings of cirrhosis and portal venous hypertension. Decreased abdominal pelvic ascites with loculated ascitic fluid in the left side of the abdomen.  4.  Proximal sigmoid wall thickening which is nonspecific in the setting of portal venous hypertension.  Cannot exclude infectious or inflammatory colitis.   Original Report Authenticated By: Jeronimo Greaves, M.D.    Results for orders placed during the hospital encounter of 06/13/13  BASIC METABOLIC PANEL      Result Value Range   Sodium 134 (*) 135 - 145 mEq/L   Potassium 4.8  3.5 - 5.1 mEq/L   Chloride 99  96 - 112 mEq/L   CO2 28  19 - 32 mEq/L   Glucose, Bld 108 (*) 70 - 99 mg/dL   BUN 9  6 - 23 mg/dL   Creatinine, Ser 9.56  0.50 - 1.35 mg/dL   Calcium 8.7  8.4 - 21.3 mg/dL   GFR calc non Af Amer >90  >90 mL/min   GFR calc Af Amer >90  >90 mL/min      1. Back pain     MDM  Workup here in the emergency part without any specific findings. CT scan without evidence of ureteral stone. Patient has known Crohn's disease and inflammatory changes are consistent with that. Patient now feels better. Renal function is normal patient stated that he had renal insufficiency. Sounds as if he had a mild allergic reaction to medication given for his Crohn's disease up in hematology oncology clinic. That has resolved. The fluid challenges or perhaps some meds may have caused a spasm in the kidney area. Patient now feels fine stable for discharge home.  I personally performed the services described in this documentation, which was scribed in my presence. The recorded information has been reviewed and is accurate.     Shelda Jakes, MD 06/13/13 [phone]

## 2013-06-13 NOTE — Progress Notes (Addendum)
This patient was receiving a Remicade infusion ordered by Dr. Karilyn Cota (GI). I saw some bustling of the nurses at the back of the clinic where the infusion was taking place and one of the nurses and weight are made to hustle back to the room.  Upon my entrance into the infusion room, it was noted the patient is having an anaphylactic reaction to the Remicade. He received very little bit of this medication. Rapid response was called. He was short of breath and leaning forward in the reclining chair. The infusion was immediately discontinued and normal saline started to run wide-open. Oxygen mask was placed and hooked up to oxygen at 15 L. 125 mg of Solu-Medrol was administered IV in addition to 50 mg of IV Benadryl, and 20 mg of IV Pepcid. Vitals were ascertained and he was noted to be a little tachycardic with oxygen saturations within normal range. Subsequent vital checks were performed and blood pressure was noted to be elevated likely secondary to Solu-Medrol, but symptomatically the patient quickly improved. Following this reaction, the patient started to have severe back pains and he reports that he had active renal calculi. He was given 0.5 mg of IV Dilaudid approximately 10 minutes after his condition was stabilized. This did not significantly help with his discomfort. He was escorted to the emergency room for further evaluation, treatment and discharge. Patient is to follow-up with Dr. Karilyn Cota for further treatment options.    Dr. Orlie Dakin was present the clinic and reached the infusion room once the patient received the aforementioned IV medications and the patient was stabilized.  KEFALAS,THOMAS

## 2013-06-13 NOTE — Progress Notes (Signed)
1122 patient c/o trouble breathing. Remicade stopped. 1125 Solucortef 125 mg iv push. 1130 Benadryl 50 mg. 1135 breathing improved. Pt co lower back pain rates a 10. 1150 Dilaudid  0.5 mg iv for pain. 1200 continues to rate pain a 5-8. Dr Darrick Penna called and agreed with plan to transfer patient to ed. To ED at 1216 vitals stable.

## 2013-06-14 ENCOUNTER — Telehealth (INDEPENDENT_AMBULATORY_CARE_PROVIDER_SITE_OTHER): Payer: Self-pay | Admitting: *Deleted

## 2013-06-14 ENCOUNTER — Other Ambulatory Visit (INDEPENDENT_AMBULATORY_CARE_PROVIDER_SITE_OTHER): Payer: Self-pay | Admitting: Internal Medicine

## 2013-06-14 DIAGNOSIS — R109 Unspecified abdominal pain: Secondary | ICD-10-CM

## 2013-06-14 DIAGNOSIS — K50918 Crohn's disease, unspecified, with other complication: Secondary | ICD-10-CM

## 2013-06-14 MED ORDER — OXYCODONE HCL 10 MG PO TABS: 20.0000 mg | ORAL_TABLET | Freq: Three times a day (TID) | ORAL | Status: DC

## 2013-06-14 NOTE — Telephone Encounter (Addendum)
I have talked with Micahel and he states that he was not given a prescription on yesterday for pain medication. I have called [facility] and she states that it was not brought to her. I will forward this message to Dr.Rehman.and he will advise what to do.

## 2013-06-14 NOTE — Telephone Encounter (Signed)
Patient called and states that he is going to be leaving on vacation , Saturday July 3,2014 - Sunday June 25 2013 His medication refill is due 06/21/13.  He is asking if he may get this earlier (Thursday) for this reason. I have noted in the patient's chart that on July 1 that Oxycodone HCl 10 mg - Take 2 tablet ( 20 mg) by mouth three times a day. #180 was written by Vanetta Mulders. I will check with the patient to see if he picked them.Patient called and a message was left on his voicemail asking that he call the office.

## 2013-06-14 NOTE — Telephone Encounter (Signed)
Patient was called and made aware that his prescription was ready. He was also told that the next prescription would be due 07/22/13.

## 2013-06-14 NOTE — Telephone Encounter (Signed)
New prescription given to the p[atient for oxycodone for one month. Next prescription will be due on July 22, 2013.

## 2013-06-29 ENCOUNTER — Ambulatory Visit (HOSPITAL_COMMUNITY): Payer: Medicare Other

## 2013-07-03 ENCOUNTER — Ambulatory Visit (INDEPENDENT_AMBULATORY_CARE_PROVIDER_SITE_OTHER): Payer: Medicare Other | Admitting: Internal Medicine

## 2013-07-03 ENCOUNTER — Encounter (INDEPENDENT_AMBULATORY_CARE_PROVIDER_SITE_OTHER): Payer: Self-pay | Admitting: Internal Medicine

## 2013-07-03 VITALS — BP 118/70 | HR 78 | Temp 97.7°F | Resp 18 | Ht 71.0 in | Wt 197.9 lb

## 2013-07-03 DIAGNOSIS — D649 Anemia, unspecified: Secondary | ICD-10-CM

## 2013-07-03 DIAGNOSIS — K7460 Unspecified cirrhosis of liver: Secondary | ICD-10-CM

## 2013-07-03 DIAGNOSIS — K5090 Crohn's disease, unspecified, without complications: Secondary | ICD-10-CM

## 2013-07-03 NOTE — Patient Instructions (Signed)
Take Tylenol 650 mg and Zyrtec 10 mg by mouth 90 minutes before infusion. Prednisone 50 mg by mouth 26 hours, 14 hours and 2 hours before infusion( three doses before each infusion)

## 2013-07-03 NOTE — Progress Notes (Signed)
Presenting complaint;  Followup for Crohn's disease and cirrhosis.  Subjective:  Patient is 37 year old Caucasian male who presents for scheduled visit. He was last seen on 05/01/2013. He is on maintenance infliximab. He received last infusion on 06/13/2013 which was stopped after 34 minutes as he developed shortness of breath and chest pain. He was treated with steroids with resolution of his symptoms. While in the emergency room he developed abdominal pain determined to be due to renal colic. He is experiencing more abdominal pain in the right side. He also has noted increased frequency of his bowel movements. He is having at least 3-6 bowel movements per day. Most of his stools or loose to semi-formed and occasionally he has a formed stool. He is noticing blood on the tissue every now and then. She denies chest pain or shortness or breath. His appetite is good. He was seen by Dr. Valera Castle for h/o pericarditis and appeared to be doing well and was discharged from his care. He denies nausea vomiting and believes his heartburn is well controlled with therapy. He has lost 15 pounds since his last visit.  Current Medications: Current Outpatient Prescriptions  Medication Sig Dispense Refill  . calcium carbonate (OS-CAL) 600 MG TABS Take 600 mg by mouth 2 (two) times daily with a meal.      . dicyclomine (BENTYL) 10 MG capsule Take 1 capsule (10 mg total) by mouth 3 (three) times daily as needed.  90 capsule  5  . ferrous sulfate 325 (65 FE) MG tablet Take 325 mg by mouth 2 (two) times daily.      . fish oil-omega-3 fatty acids 1000 MG capsule Take 1 g by mouth 3 (three) times daily.      . furosemide (LASIX) 40 MG tablet Take 40 mg by mouth daily as needed for fluid.       . Multiple Vitamins-Minerals (MULTIVITAMINS THER. W/MINERALS) TABS Take 1 tablet by mouth 2 (two) times daily.       . Oxycodone HCl 10 MG TABS Take 2 tablets (20 mg total) by mouth 3 (three) times daily.  180 tablet  0  .  pantoprazole (PROTONIX) 40 MG tablet Take 1 tablet (40 mg total) by mouth 2 (two) times daily.  60 tablet  5  . potassium chloride (K-DUR) 10 MEQ tablet Take 20 mEq by mouth daily.       . Probiotic Product (ALIGN) 4 MG CAPS Take 4 mg by mouth daily.       Marland Kitchen spironolactone (ALDACTONE) 50 MG tablet Take 50 mg by mouth daily.       . budesonide (ENTOCORT EC) 3 MG 24 hr capsule Take 3 capsules (9 mg total) by mouth daily.  30 capsule  1  . clonazePAM (KLONOPIN) 0.5 MG tablet Take 0.5 mg by mouth 2 (two) times daily as needed for anxiety.       No current facility-administered medications for this visit.     Objective: Blood pressure 118/70, pulse 78, temperature 97.7 F (36.5 C), temperature source Oral, resp. rate 18, height 5\' 11"  (1.803 m), weight 197 lb 14.4 oz (89.767 kg). The patient is alert and does not have asterixis. Conjunctiva is pink. Sclera is nonicteric Oropharyngeal mucosa is normal. No neck masses or thyromegaly noted. Cardiac exam with regular rhythm normal S1 and S2. No murmur or gallop noted. Lungs are clear to auscultation. Abdomen is full. Bowel sounds are normal. He has fullness and mild to moderate tenderness in right low quadrant. Liver  edge is also tender in its firm. Spleen is not palpable.  No LE edema or clubbing noted.     Assessment:  #1. Crohn's disease. He had  hypersensitivity reaction to infliximab infusion 3 weeks ago and it was cut short. Reaction does not appear to be true allergy. Treatment options are limited for him. He is having more abdominal pain, diarrhea and low-grade fever. He would be premedicated with 3 doses of prednisone prior to each infusion. He's off the desonide she could not afford. #2. Cirrhosis. Weight loss would appear to be secondary to diureses. He was evaluated by Dr. Julieta Gutting of [facility] and his next appointment is about 5 months. #3. Anemia. Multifactorial. #4. Chronic abdominal pain. He remains oxycodone. 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 He was seen by Dr. Maisie Fus wall and released from his care.   Plan:  Schedule infliximab infusion at a slower rate and furthermore he will be premedicated with 3 doses of prednisone 50 mg every 12 hours prior to infusion along with acetaminophen and Zyrtec. He will go to lab for CBC and LFTs. Office visit in 8 weeks.

## 2013-07-07 ENCOUNTER — Encounter (HOSPITAL_COMMUNITY)
Admission: RE | Admit: 2013-07-07 | Discharge: 2013-07-07 | Disposition: A | Discharge status: Home / Self Care | Payer: Medicare Other | Source: Ambulatory Visit | Attending: Internal Medicine | Admitting: Internal Medicine

## 2013-07-07 MED ORDER — FENTANYL CITRATE 0.05 MG/ML IJ SOLN
INTRAMUSCULAR | Status: AC
  Filled 2013-07-07: qty 2

## 2013-07-07 MED ORDER — FENTANYL CITRATE 0.05 MG/ML IJ SOLN
25.0000 ug | Freq: Once | INTRAMUSCULAR | Status: AC
  Administered 2013-07-07: 25 ug via INTRAVENOUS

## 2013-07-07 MED ORDER — FAMOTIDINE IN NACL 20-0.9 MG/50ML-% IV SOLN
20.0000 mg | Freq: Once | INTRAVENOUS | Status: AC
  Administered 2013-07-07: 20 mg via INTRAVENOUS
  Filled 2013-07-07: qty 50

## 2013-07-07 MED ORDER — SODIUM CHLORIDE 0.9 % IV SOLN
400.0000 mg | Freq: Once | INTRAVENOUS | Status: AC
  Administered 2013-07-07: 400 mg via INTRAVENOUS
  Filled 2013-07-07: qty 40

## 2013-07-07 MED ORDER — SODIUM CHLORIDE 0.9 % IV SOLN
INTRAVENOUS | Status: DC
  Administered 2013-07-07: 1000 mL via INTRAVENOUS

## 2013-07-07 MED ORDER — METHYLPREDNISOLONE SODIUM SUCC 1000 MG IJ SOLR
40.0000 mg | Freq: Once | INTRAMUSCULAR | Status: AC
  Administered 2013-07-07: 40 mg via INTRAVENOUS
  Filled 2013-07-07: qty 0.32

## 2013-07-07 MED ORDER — DIPHENHYDRAMINE HCL 50 MG/ML IJ SOLN
25.0000 mg | Freq: Once | INTRAMUSCULAR | Status: AC
  Administered 2013-07-07: 25 mg via INTRAVENOUS

## 2013-07-07 NOTE — Progress Notes (Addendum)
Immediately c/o feeling tightness in chest, face flushed. o2 face mask at 10 L. remicade stopped. NS infusing. Transferred to PACU. Rapid Response initiated.

## 2013-07-07 NOTE — Progress Notes (Signed)
Dr Karilyn Cota in to assess pt. States pt may be discharged. Dr Karilyn Cota will contact pt later with future plans for treatment.

## 2013-07-07 NOTE — Progress Notes (Signed)
Facial  Color wnl, denies any reaction symptoms, back pain 1/10. Laughing and talking with wife and staff.

## 2013-07-07 NOTE — Progress Notes (Signed)
To car, ambulatory. Denies any discomfort.

## 2013-07-07 NOTE — Progress Notes (Signed)
Pt arrived in Short Stay, ambulatory, for remicade infusion. States he took 650 mg tylenol with zyrtec 10 mg at 0730 this am. Took prednisone 50 mg 7/24 at 0700; 50 mg at 1900 on 7/24, 50 mg at 0700 on 7/25 all per order Dr Karilyn Cota.

## 2013-07-11 ENCOUNTER — Ambulatory Visit (INDEPENDENT_AMBULATORY_CARE_PROVIDER_SITE_OTHER): Payer: Medicare Other | Admitting: Internal Medicine

## 2013-07-11 ENCOUNTER — Encounter (INDEPENDENT_AMBULATORY_CARE_PROVIDER_SITE_OTHER): Payer: Self-pay | Admitting: Internal Medicine

## 2013-07-11 VITALS — BP 118/74 | HR 80 | Temp 98.0°F | Resp 18 | Ht 71.0 in | Wt 201.6 lb

## 2013-07-11 DIAGNOSIS — K50911 Crohn's disease, unspecified, with rectal bleeding: Secondary | ICD-10-CM

## 2013-07-11 DIAGNOSIS — D649 Anemia, unspecified: Secondary | ICD-10-CM

## 2013-07-11 DIAGNOSIS — R109 Unspecified abdominal pain: Secondary | ICD-10-CM

## 2013-07-11 DIAGNOSIS — K5090 Crohn's disease, unspecified, without complications: Secondary | ICD-10-CM

## 2013-07-11 MED ORDER — PREDNISONE 10 MG PO TABS: 30.0000 mg | ORAL_TABLET | Freq: Every day | ORAL | Status: DC

## 2013-07-11 NOTE — Progress Notes (Signed)
Presenting complaint;  Followup for Crohn's disease.  Subjective:  Patient is 78 old Caucasian male with history of ileocolonic rectal Crohn's disease who was last seen on 07/03/2013 and scheduled to receive infliximab infusion following 3 doses of prednisone. This was on 07/07/2013. He was also premedicated with Zyrtec and acetaminophen. Infliximab was administered at a slower rate but he developed difficulty breathing chest pain as well as back pain and therefore infliximab was discontinued. He was given a dose of Solu-Medrol. His symptoms resolved and he was discharged. He called this morning and requested to be seen. He reports increasing lower abdominal pain. He has worse pain at night and is unable to rest. He remains oxycodone 20 mg 3 times a day. He feels he needs an additional dose of narcotic that he could take in the middle of night. He has gained 4 pounds since his last with since last visit 8 days ago. He remains with low-grade temperatures usually at night. He has 3-4 bowel movements per day. Most of the stools are loose to semi-formed. He has occasional hematochezia. He denies nausea vomiting chest pain or shortness of breath.  Current Medications: Current Outpatient Prescriptions  Medication Sig Dispense Refill  . calcium carbonate (OS-CAL) 600 MG TABS Take 600 mg by mouth 2 (two) times daily with a meal.      . clonazePAM (KLONOPIN) 0.5 MG tablet Take 0.5 mg by mouth 2 (two) times daily as needed for anxiety.      . dicyclomine (BENTYL) 10 MG capsule Take 1 capsule (10 mg total) by mouth 3 (three) times daily as needed.  90 capsule  5  . ferrous sulfate 325 (65 FE) MG tablet Take 325 mg by mouth 2 (two) times daily.      . fish oil-omega-3 fatty acids 1000 MG capsule Take 1 g by mouth 3 (three) times daily.      . furosemide (LASIX) 40 MG tablet Take 40 mg by mouth daily as needed for fluid.       . Multiple Vitamins-Minerals (MULTIVITAMINS THER. W/MINERALS) TABS Take 1 tablet by  mouth 2 (two) times daily.       . Oxycodone HCl 10 MG TABS Take 2 tablets (20 mg total) by mouth 3 (three) times daily.  180 tablet  0  . pantoprazole (PROTONIX) 40 MG tablet Take 1 tablet (40 mg total) by mouth 2 (two) times daily.  60 tablet  5  . potassium chloride (K-DUR) 10 MEQ tablet Take 20 mEq by mouth daily.       . Probiotic Product (ALIGN) 4 MG CAPS Take 4 mg by mouth daily.       Marland Kitchen spironolactone (ALDACTONE) 50 MG tablet Take 50 mg by mouth daily.        No current facility-administered medications for this visit.     Objective: Blood pressure 118/74, pulse 80, temperature 98 F (36.7 C), temperature source Oral, resp. rate 18, height 5\' 11"  (1.803 m), weight 201 lb 9.6 oz (91.445 kg). Patient is alert and does not appear to be in acute distress. Conjunctiva is pink. Sclera is nonicteric Oropharyngeal mucosa is normal. No neck masses or thyromegaly noted. Cardiac exam with regular rhythm normal S1 and S2. No murmur or gallop noted. Lungs are clear to auscultation. Abdomen is full. Bowel sounds are normal. Abdomen is soft with tender liver edge and moderate tenderness in right lower quadrant with some fullness but no definite mass noted. He has trace pitting edema around the ankles but  no clubbing noted.  Labs/studies Results: He had upper GI with small bowel follow-through in January 2014 revealing prominence to the gastric folds and nodularity and wall thickening to distal small bowel. CT in April 2014 revealed moderate to large amount of ascites, splenomegaly and diffuse subcutaneous and mesenteric edema and small right renal calculus. Another CT on 06/13/2013 revealing changes of cirrhosis and portal hypertension and less ascites compared to prior study as well as wall thickening thickening to proximal sigmoid colon but no abnormality noted the small bowel. H&H on 05/05/2013 was 8.5 and 25.7 CRP on 05/02/2013 was 1.4; it was 6.0 on 12/26/2012.   Assessment:  #1.  Ileocolonic Crohn's disease. Patient is having increasing abdominal pain. He developed reaction with infliximab infusion last week which was discontinued. He already has been treated with Humira and Cimzia. The marrow did not work and he had fever but sensitive. Methotrexate was discontinued because of hepatic fibrosis and cirrhosis similarly 6-MP has been ineffective. Therefore treatment options are very limited. If JCV antibody is negative he could be treated with Tysabri or Natalizumab. #2. Chronic abdominal pain. He is having more pain and requesting dose escalation. He previously has seen Dr. [name] for pain control was not satisfied. It is difficult to determine whether his pain is secondary to Crohn's disease or results of tachyphylaxis #3. Cirrhosis. He has been evaluated by Dr. Julieta Gutting at [facility]. His ascites is well controlled. #4. Anemia. He is due for CBC.   Plan: Begin prednisone; schedule as follows.  30 mg daily for one week.  25 mg daily for one week.  20 mg daily for one week  15 mg daily for one week.  10 mg daily for one week.  5 mg daily for one week and stop. Patient will go to the lab for CBC with differential, comprehensive chemistry panel, CRP and JCV antibody level. Consultation Dr. Thyra Breed for pain management. Patient given information regarding in touch program in case Tysabri considered. I will contact patient with results of blood work and further recommendations. Office visit in 4 weeks.

## 2013-07-11 NOTE — Patient Instructions (Signed)
Physician will contact you with results of blood work. Referral to pain clinic to be arranged. Prednisone schedule as follows. 30 mg daily for one week. 25 mg daily for one week. 20 mg daily for one week 15 mg daily for one week. 10 mg daily for one week. 5 mg daily for one week and stop. Physician will contact her with results of blood work.

## 2013-07-12 ENCOUNTER — Other Ambulatory Visit (INDEPENDENT_AMBULATORY_CARE_PROVIDER_SITE_OTHER): Payer: Self-pay | Admitting: Internal Medicine

## 2013-07-12 ENCOUNTER — Ambulatory Visit (INDEPENDENT_AMBULATORY_CARE_PROVIDER_SITE_OTHER): Payer: Medicare Other | Admitting: Internal Medicine

## 2013-07-12 LAB — COMPREHENSIVE METABOLIC PANEL
Albumin: 3.2 g/dL — ABNORMAL LOW (ref 3.5–5.2)
BUN: 10 mg/dL (ref 6–23)
CO2: 30 mEq/L (ref 19–32)
Calcium: 8.6 mg/dL (ref 8.4–10.5)
Glucose, Bld: 125 mg/dL — ABNORMAL HIGH (ref 70–99)
Potassium: 4.5 mEq/L (ref 3.5–5.3)
Sodium: 139 mEq/L (ref 135–145)
Total Protein: 6.1 g/dL (ref 6.0–8.3)

## 2013-07-12 LAB — C-REACTIVE PROTEIN: CRP: 2.2 mg/dL — ABNORMAL HIGH (ref <0.60)

## 2013-07-12 LAB — CBC
HCT: 28.9 % — ABNORMAL LOW (ref 39.0–52.0)
Hemoglobin: 9.1 g/dL — ABNORMAL LOW (ref 13.0–17.0)
RBC: 3.63 MIL/uL — ABNORMAL LOW (ref 4.22–5.81)

## 2013-07-14 ENCOUNTER — Telehealth (INDEPENDENT_AMBULATORY_CARE_PROVIDER_SITE_OTHER): Payer: Self-pay | Admitting: *Deleted

## 2013-07-14 NOTE — Telephone Encounter (Signed)
I called the [facility] and ask when was the last time David [name]  rec'd the Opana. I was told April 21,2014 and this was precribed by Delrae Rend.

## 2013-07-14 NOTE — Telephone Encounter (Signed)
Dr.Rehman , [name] called this morning asking of you would write a prescription for the Opana. He states that he has saved up enough money to buy them. Patient advised that this would be addressed by you upon your return Tuesday. Caldwell's home number is [phone].

## 2013-07-18 ENCOUNTER — Telehealth (INDEPENDENT_AMBULATORY_CARE_PROVIDER_SITE_OTHER): Payer: Self-pay | Admitting: *Deleted

## 2013-07-18 ENCOUNTER — Other Ambulatory Visit (INDEPENDENT_AMBULATORY_CARE_PROVIDER_SITE_OTHER): Payer: Self-pay | Admitting: Internal Medicine

## 2013-07-18 LAB — STRATIFY JCV ANTIBODY ELISA W/RFLX TO INHIBITION ASSAY

## 2013-07-18 MED ORDER — OXYMORPHONE HCL ER 10 MG PO TB12: 10.0000 mg | ORAL_TABLET | Freq: Every day | ORAL | Status: DC

## 2013-07-18 NOTE — Telephone Encounter (Signed)
I will leave patient on oxycodone 20 mg 3 times a day and let pain specialist make any changes in his pain medications.

## 2013-07-18 NOTE — Telephone Encounter (Signed)
Bradd called this morning leaving a message asking if when Dr.Rehman wrote the prescription for the Opana that he write it only for #30 instead of #60. Lazer states that he is just going to take this (Opana) mainly at night. He also ask if Dr.Rehman would go ahead and write the other pain medication as it is getting close to it being filled. If any questions he ask that we call hm @ [phone].

## 2013-07-19 ENCOUNTER — Other Ambulatory Visit (INDEPENDENT_AMBULATORY_CARE_PROVIDER_SITE_OTHER): Payer: Self-pay | Admitting: Internal Medicine

## 2013-07-19 DIAGNOSIS — R109 Unspecified abdominal pain: Secondary | ICD-10-CM

## 2013-07-19 DIAGNOSIS — K50918 Crohn's disease, unspecified, with other complication: Secondary | ICD-10-CM

## 2013-07-19 LAB — STRATIFY JCV AB INHIBITION (REFLEX)

## 2013-07-19 MED ORDER — OXYCODONE HCL 10 MG PO TABS: 20.0000 mg | ORAL_TABLET | Freq: Three times a day (TID) | ORAL | Status: DC

## 2013-07-19 NOTE — Progress Notes (Signed)
JCV antibody positve; Patient therefore can be given Tysabri. Patient aware. Need to discuss with GI expert at [facility].

## 2013-07-27 NOTE — Telephone Encounter (Signed)
Talked with Dr. Cedric Fishman of [facility] yesterday. Consultation requested and records have been faxed

## 2013-08-09 ENCOUNTER — Telehealth (INDEPENDENT_AMBULATORY_CARE_PROVIDER_SITE_OTHER): Payer: Self-pay | Admitting: *Deleted

## 2013-08-09 NOTE — Telephone Encounter (Signed)
I agree he needs an office visit to discuss tapering his pain medication

## 2013-08-09 NOTE — Telephone Encounter (Signed)
Dr.Rehman,  David [name] left the following message on my voicemail this morning. He wants to come off the pain medication,just take it when he really needs it. He is concerned about withdrawals because he has been on it for a long time , he ask if you could help him back off the medications/withdrawals. States that if he needs a office visit to talk about this ,he will be happy to come in to talk with you.  Patient was advised that Dr.Rehman would be made aware and we will contact him.

## 2013-08-10 ENCOUNTER — Ambulatory Visit (HOSPITAL_COMMUNITY): Payer: Medicare Other

## 2013-08-10 ENCOUNTER — Inpatient Hospital Stay (HOSPITAL_COMMUNITY): Admission: RE | Admit: 2013-08-10 | Payer: Medicare Other | Source: Ambulatory Visit

## 2013-08-10 NOTE — Telephone Encounter (Signed)
Patient has an apt scheduled for 09/04/13 with Dr. Karilyn Cota.

## 2013-08-10 NOTE — Telephone Encounter (Signed)
David [name] - Please make an appointment with Dr.Rehman for [name] for as soon as possible. I will contact Davidjames and let him know that you will be calling him with the appointment.

## 2013-08-18 ENCOUNTER — Other Ambulatory Visit (INDEPENDENT_AMBULATORY_CARE_PROVIDER_SITE_OTHER): Payer: Self-pay | Admitting: *Deleted

## 2013-08-18 DIAGNOSIS — K50918 Crohn's disease, unspecified, with other complication: Secondary | ICD-10-CM

## 2013-08-18 DIAGNOSIS — R109 Unspecified abdominal pain: Secondary | ICD-10-CM

## 2013-08-18 MED ORDER — OXYMORPHONE HCL ER 10 MG PO TB12: 10.0000 mg | ORAL_TABLET | Freq: Every day | ORAL | Status: DC

## 2013-08-18 MED ORDER — OXYCODONE HCL 10 MG PO TABS: 20.0000 mg | ORAL_TABLET | Freq: Three times a day (TID) | ORAL | Status: DC

## 2013-08-18 NOTE — Telephone Encounter (Signed)
Forwarded to Dr.Rehman for review. 

## 2013-08-24 ENCOUNTER — Ambulatory Visit (HOSPITAL_COMMUNITY): Payer: Medicare Other

## 2013-09-04 ENCOUNTER — Ambulatory Visit (INDEPENDENT_AMBULATORY_CARE_PROVIDER_SITE_OTHER): Payer: Medicare Other | Admitting: Internal Medicine

## 2013-09-04 ENCOUNTER — Encounter (INDEPENDENT_AMBULATORY_CARE_PROVIDER_SITE_OTHER): Payer: Self-pay | Admitting: Internal Medicine

## 2013-09-04 VITALS — BP 130/70 | HR 76 | Temp 98.9°F | Resp 18 | Ht 71.0 in | Wt 218.4 lb

## 2013-09-04 DIAGNOSIS — K219 Gastro-esophageal reflux disease without esophagitis: Secondary | ICD-10-CM

## 2013-09-04 DIAGNOSIS — R609 Edema, unspecified: Secondary | ICD-10-CM

## 2013-09-04 DIAGNOSIS — K7460 Unspecified cirrhosis of liver: Secondary | ICD-10-CM

## 2013-09-04 DIAGNOSIS — D509 Iron deficiency anemia, unspecified: Secondary | ICD-10-CM

## 2013-09-04 DIAGNOSIS — R600 Localized edema: Secondary | ICD-10-CM

## 2013-09-04 DIAGNOSIS — K50918 Crohn's disease, unspecified, with other complication: Secondary | ICD-10-CM

## 2013-09-04 DIAGNOSIS — K5090 Crohn's disease, unspecified, without complications: Secondary | ICD-10-CM

## 2013-09-04 MED ORDER — LANSOPRAZOLE 30 MG PO CPDR: 30.0000 mg | DELAYED_RELEASE_CAPSULE | Freq: Every day | ORAL | Status: DC

## 2013-09-04 NOTE — Progress Notes (Signed)
Presenting complaint;  Followup for Crohn's disease  Subjective:  Patient is 37 year old Caucasian male who presents for scheduled visit. He has complicated history of Crohn disease and he also has cirrhosis. He was last seen on 07/11/2013. He was begun on prednisone. He began to feel better. However when he reduce his dose from 20 mg to 15 mg his abdominal pain gets worse. He is back to taking 20 mg a day. He had an appointment to see Dr. Welford Roche of [facility] last week but he could not go on account of diarrhea. He has an appointment in about 5 weeks. He has noted improvement in his appetite. He is still having low-grade temperature on most evenings. He denies chest pain cough or shortness of breath. He is having 3-4 stools daily. Most of the stools are semi-formed. While he was on 30 mg of prednisone a day he was having normal stools. He has occasional hematochezia. He has gained 17 pounds since his last visit. He did notice lower extremity edema over the weekend his back on furosemide. He only has one episode of vomiting since he's been on prednisone. He also complains of right upper quadrant pain and is not able to lie on his right side. He states that this pain prevents him from sleeping at night. He is hoping to come off pain medications when his condition is in remission.  Current Medications: Current Outpatient Prescriptions  Medication Sig Dispense Refill  . calcium carbonate (OS-CAL) 600 MG TABS Take 600 mg by mouth 2 (two) times daily with a meal.      . dicyclomine (BENTYL) 10 MG capsule Take 1 capsule (10 mg total) by mouth 3 (three) times daily as needed.  90 capsule  5  . ferrous sulfate 325 (65 FE) MG tablet Take 325 mg by mouth 2 (two) times daily.      . fish oil-omega-3 fatty acids 1000 MG capsule Take 1 g by mouth 3 (three) times daily.      . furosemide (LASIX) 40 MG tablet Take 40 mg by mouth daily as needed for fluid.       . Multiple Vitamins-Minerals (MULTIVITAMINS  THER. W/MINERALS) TABS Take 1 tablet by mouth 2 (two) times daily.       . Oxycodone HCl 10 MG TABS Take 2 tablets (20 mg total) by mouth 3 (three) times daily.  180 tablet  0  . oxymorphone (OPANA ER) 10 MG 12 hr tablet Take 1 tablet (10 mg total) by mouth at bedtime.  30 tablet  0  . pantoprazole (PROTONIX) 40 MG tablet Take 1 tablet (40 mg total) by mouth 2 (two) times daily.  60 tablet  5  . potassium chloride (K-DUR) 10 MEQ tablet Take 20 mEq by mouth daily.       . predniSONE (DELTASONE) 10 MG tablet Take 3 tablets (30 mg total) by mouth daily.  90 tablet  0  . Probiotic Product (ALIGN) 4 MG CAPS Take 4 mg by mouth daily.       Marland Kitchen spironolactone (ALDACTONE) 50 MG tablet Take 50 mg by mouth daily.        No current facility-administered medications for this visit.     Objective: Blood pressure 130/70, pulse 76, temperature 98.9 F (37.2 C), temperature source Oral, resp. rate 18, height 5\' 11"  (1.803 m), weight 218 lb 6.4 oz (99.066 kg). Patient is alert and in no acute distress. Conjunctiva is pink. Sclera is nonicteric Oropharyngeal mucosa is normal. No neck  masses or thyromegaly noted. Cardiac exam with regular rhythm normal S1 and S2. No murmur or gallop noted. Lungs are clear to auscultation. Abdomen is full. Bowel sounds are normal. Soft abdomen fullness and tenderness in the right lower quadrant and midabdomen. Liver edge is 3-4 cm below RCM firm and tender.  He has 1+ pitting edema to both lower extremities.   Assessment:  #1. Crohn's disease involving small as well as large bowel. He is presently on prednisone and experiencing a relapse of his symptoms with dose reduction. He is not a candidate for Tysabri because of positive JCV antibody. He developed severe reaction with infliximab infusion. He had been on infliximab for about 5 years. He has been on Humira and Cimzia in the past but these agents did not work. Similarly 6-MP did not control his disease activity and  methotrexate was discontinued last year because of development of hepatic fibrosis/cirrhosis. #2. Iron deficiency anemia. He is on by mouth iron. Last hemoglobin was 9.1 g. #3. 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 He has been evaluated at [facility] as he may need liver transplant down the road. #4. LE edema.   Plan:  He will continue to use furosemide on an as-needed basis. Decrease prednisone to 17.5 mg by mouth daily. He we will do lab for CBC, comprehensive chemistry panel and CRP. He will pick up a copy of his blood work for this visit at [facility] next month. Office visit in 2 months.

## 2013-09-05 LAB — CBC
MCH: 21.6 pg — ABNORMAL LOW (ref 26.0–34.0)
MCHC: 29.8 g/dL — ABNORMAL LOW (ref 30.0–36.0)
Platelets: 156 10*3/uL (ref 150–400)
RDW: 18 % — ABNORMAL HIGH (ref 11.5–15.5)

## 2013-09-05 LAB — C-REACTIVE PROTEIN: CRP: <0.5 mg/dL (ref <0.60)

## 2013-09-05 LAB — COMPREHENSIVE METABOLIC PANEL
ALT: 19 U/L (ref 0–53)
AST: 26 U/L (ref 0–37)
Alkaline Phosphatase: 145 U/L — ABNORMAL HIGH (ref 39–117)
CO2: 30 mEq/L (ref 19–32)
Creat: 0.7 mg/dL (ref 0.50–1.35)
Sodium: 139 mEq/L (ref 135–145)
Total Bilirubin: 0.5 mg/dL (ref 0.3–1.2)

## 2013-09-08 ENCOUNTER — Telehealth (INDEPENDENT_AMBULATORY_CARE_PROVIDER_SITE_OTHER): Payer: Self-pay | Admitting: *Deleted

## 2013-09-08 ENCOUNTER — Encounter (HOSPITAL_COMMUNITY)
Admission: RE | Admit: 2013-09-08 | Discharge: 2013-09-08 | Disposition: A | Discharge status: Home / Self Care | Payer: Medicare Other | Source: Ambulatory Visit | Attending: Internal Medicine | Admitting: Internal Medicine

## 2013-09-08 DIAGNOSIS — D508 Other iron deficiency anemias: Secondary | ICD-10-CM | POA: Insufficient documentation

## 2013-09-08 DIAGNOSIS — D509 Iron deficiency anemia, unspecified: Secondary | ICD-10-CM

## 2013-09-08 MED ORDER — SODIUM CHLORIDE 0.9 % IV SOLN
Freq: Once | INTRAVENOUS | Status: DC
  Administered 2013-09-08: 10:00:00 via INTRAVENOUS

## 2013-09-08 MED ORDER — SODIUM CHLORIDE 0.9 % IV SOLN
INTRAVENOUS | Status: AC
  Filled 2013-09-08: qty 50

## 2013-09-08 MED ORDER — FERUMOXYTOL INJECTION 510 MG/17 ML
510.0000 mg | Freq: Once | INTRAVENOUS | Status: AC
  Administered 2013-09-08: 510 mg via INTRAVENOUS
  Filled 2013-09-08: qty 17

## 2013-09-08 NOTE — Telephone Encounter (Signed)
Per Dr.Rehman the patient will need to have labs drawn 2 weeks post the 2 nd feraheme infusion.

## 2013-09-13 ENCOUNTER — Other Ambulatory Visit (INDEPENDENT_AMBULATORY_CARE_PROVIDER_SITE_OTHER): Payer: Self-pay | Admitting: *Deleted

## 2013-09-13 ENCOUNTER — Encounter (INDEPENDENT_AMBULATORY_CARE_PROVIDER_SITE_OTHER): Payer: Self-pay | Admitting: *Deleted

## 2013-09-13 DIAGNOSIS — K50918 Crohn's disease, unspecified, with other complication: Secondary | ICD-10-CM

## 2013-09-13 DIAGNOSIS — D509 Iron deficiency anemia, unspecified: Secondary | ICD-10-CM

## 2013-09-13 DIAGNOSIS — R109 Unspecified abdominal pain: Secondary | ICD-10-CM

## 2013-09-13 NOTE — Telephone Encounter (Signed)
David [name] called office this morning to say that he would need his pain medication refills written out and pick up on Friday as they are due to be filled on Sunday October 5 th. Both last written was 08/18/13.

## 2013-09-15 ENCOUNTER — Encounter (HOSPITAL_COMMUNITY)
Admission: RE | Admit: 2013-09-15 | Discharge: 2013-09-15 | Disposition: A | Discharge status: Home / Self Care | Payer: Medicare Other | Source: Ambulatory Visit | Attending: Internal Medicine | Admitting: Internal Medicine

## 2013-09-15 DIAGNOSIS — D508 Other iron deficiency anemias: Secondary | ICD-10-CM | POA: Insufficient documentation

## 2013-09-15 MED ORDER — SODIUM CHLORIDE 0.9 % IV SOLN
INTRAVENOUS | Status: DC
  Administered 2013-09-15: 09:00:00 via INTRAVENOUS

## 2013-09-15 MED ORDER — SODIUM CHLORIDE 0.9 % IV SOLN
INTRAVENOUS | Status: AC
  Filled 2013-09-15: qty 50

## 2013-09-15 MED ORDER — FERUMOXYTOL INJECTION 510 MG/17 ML
510.0000 mg | Freq: Once | INTRAVENOUS | Status: AC
  Administered 2013-09-15: 510 mg via INTRAVENOUS
  Filled 2013-09-15: qty 17

## 2013-09-15 MED ORDER — OXYCODONE HCL 10 MG PO TABS: 20.0000 mg | ORAL_TABLET | Freq: Three times a day (TID) | ORAL | Status: DC

## 2013-09-15 MED ORDER — OXYMORPHONE HCL ER 10 MG PO TB12: 10.0000 mg | ORAL_TABLET | Freq: Every day | ORAL | Status: DC

## 2013-09-22 ENCOUNTER — Telehealth (INDEPENDENT_AMBULATORY_CARE_PROVIDER_SITE_OTHER): Payer: Self-pay | Admitting: *Deleted

## 2013-09-22 ENCOUNTER — Encounter (HOSPITAL_COMMUNITY)
Admission: RE | Admit: 2013-09-22 | Discharge: 2013-09-22 | Disposition: A | Discharge status: Home / Self Care | Payer: Medicare Other | Source: Ambulatory Visit | Attending: Internal Medicine | Admitting: Internal Medicine

## 2013-09-22 DIAGNOSIS — K5090 Crohn's disease, unspecified, without complications: Secondary | ICD-10-CM

## 2013-09-22 DIAGNOSIS — D649 Anemia, unspecified: Secondary | ICD-10-CM

## 2013-09-22 LAB — HEMOGLOBIN AND HEMATOCRIT, BLOOD
HCT: 34.4 % — ABNORMAL LOW (ref 39.0–52.0)
Hemoglobin: 10.5 g/dL — ABNORMAL LOW (ref 13.0–17.0)

## 2013-09-22 NOTE — Progress Notes (Signed)
Results for JUWON, SCRIPTER (MRN [id number]) as of 09/22/2013 12:19  Ref. Range 09/22/2013 09:10  Hemoglobin Latest Range: 13.0-17.0 g/dL 46.9 (L)  HCT Latest Range: 39.0-52.0 % 34.4 (L)  Post Feraheme infusions

## 2013-09-22 NOTE — Telephone Encounter (Signed)
Per Dr.Rehman the patient will need to have labs drawn prior to OV 11/06/13.

## 2013-09-25 NOTE — Progress Notes (Signed)
Apt called and advised to have blood work drawn on 11/03/13 and voices understood.

## 2013-10-04 ENCOUNTER — Ambulatory Visit (HOSPITAL_COMMUNITY): Payer: Medicare Other

## 2013-10-05 ENCOUNTER — Encounter (INDEPENDENT_AMBULATORY_CARE_PROVIDER_SITE_OTHER): Payer: Self-pay | Admitting: *Deleted

## 2013-10-05 ENCOUNTER — Other Ambulatory Visit (INDEPENDENT_AMBULATORY_CARE_PROVIDER_SITE_OTHER): Payer: Self-pay | Admitting: *Deleted

## 2013-10-05 ENCOUNTER — Ambulatory Visit (HOSPITAL_COMMUNITY): Payer: Medicare Other

## 2013-10-05 DIAGNOSIS — D649 Anemia, unspecified: Secondary | ICD-10-CM

## 2013-10-05 DIAGNOSIS — K5090 Crohn's disease, unspecified, without complications: Secondary | ICD-10-CM

## 2013-10-11 ENCOUNTER — Other Ambulatory Visit (INDEPENDENT_AMBULATORY_CARE_PROVIDER_SITE_OTHER): Payer: Self-pay | Admitting: *Deleted

## 2013-10-11 DIAGNOSIS — K50918 Crohn's disease, unspecified, with other complication: Secondary | ICD-10-CM

## 2013-10-11 DIAGNOSIS — R109 Unspecified abdominal pain: Secondary | ICD-10-CM

## 2013-10-11 DIAGNOSIS — K50911 Crohn's disease, unspecified, with rectal bleeding: Secondary | ICD-10-CM

## 2013-10-11 MED ORDER — OXYCODONE HCL 10 MG PO TABS: 20.0000 mg | ORAL_TABLET | Freq: Three times a day (TID) | ORAL | Status: DC

## 2013-10-11 MED ORDER — OXYMORPHONE HCL ER 10 MG PO TB12: 10.0000 mg | ORAL_TABLET | Freq: Every day | ORAL | Status: DC

## 2013-10-11 MED ORDER — PREDNISONE 10 MG PO TABS: 30.0000 mg | ORAL_TABLET | Freq: Every day | ORAL | Status: DC

## 2013-10-11 NOTE — Telephone Encounter (Signed)
Patient called and request his refills for both pain medications, which are due to be filled this weekend. He has ran out of his prednisone and also request this medication. Forwarded to Dr.Rehman to address.

## 2013-10-19 ENCOUNTER — Other Ambulatory Visit: Payer: Self-pay

## 2013-10-30 ENCOUNTER — Telehealth (INDEPENDENT_AMBULATORY_CARE_PROVIDER_SITE_OTHER): Payer: Self-pay | Admitting: *Deleted

## 2013-10-30 NOTE — Telephone Encounter (Signed)
David [name] left a message and stated the following. Since his Endoscopy last week @ [facility], he has had a constant pain  (Ache) from right to left of abd. He was asking if we could bump him up from 11/06/13 to this week. He also had ask last week about having the new infusions done here. Per Dr.Rehman the patient will need to call and let the ones at [facility] know what is going on with his abd. If they cannot see him then he is to call us back. In regard to the infusion ,he will need to have at least a couple there and if there is no problem , he can ask that they call us and we will look into  doing them here. Patient was made aware.

## 2013-11-06 ENCOUNTER — Ambulatory Visit (INDEPENDENT_AMBULATORY_CARE_PROVIDER_SITE_OTHER): Payer: Medicare Other | Admitting: Internal Medicine

## 2013-11-06 ENCOUNTER — Encounter (INDEPENDENT_AMBULATORY_CARE_PROVIDER_SITE_OTHER): Payer: Self-pay | Admitting: Internal Medicine

## 2013-11-06 VITALS — BP 124/72 | HR 74 | Temp 97.4°F | Resp 18 | Ht 71.0 in | Wt 220.4 lb

## 2013-11-06 DIAGNOSIS — R109 Unspecified abdominal pain: Secondary | ICD-10-CM

## 2013-11-06 DIAGNOSIS — K7460 Unspecified cirrhosis of liver: Secondary | ICD-10-CM

## 2013-11-06 DIAGNOSIS — K5090 Crohn's disease, unspecified, without complications: Secondary | ICD-10-CM

## 2013-11-06 NOTE — Progress Notes (Signed)
Presenting complaint;  Mid abdominal pain.  Subjective:  Patient is 37 year old Caucasian male with history of Crohn's disease and cirrhosis who called office last week requesting to be seen for mid abdominal pain. Patient was referred to [facility] for further evaluation of refractory Crohn's disease. He was seen by Dr. Leland Her, MD on 10/12/2013 and underwent multiple studies including MRI, upper GI with small bowel series, EGD and colonoscopy. Patient has copies of these records but forgot them at home. He was told that he had Crohn's disease involving distal stomach and small intestine. He also had small adenoma removed from ascending colon. He had stricture at ileocecal valve and terminal ileum could not be examined. It was recommended that he be treated with vedolizumab along with 6-MP if cleared by Dr. 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 Patient is hoping to initiate new treatment regiment in January 2015 when new insurance plan would be in place. Patient states he's been having recurrent mid abdominal pain since colonoscopy. He describes a sharp pain which comes and goes. He has experienced nausea but no vomiting. He decided to increase prednisone dose to 30 mg daily and feels some better. He continues to have low-grade fever daily. He denies melena or rectal bleeding. He is having 3-4 stools daily. Stools are loose to mushy. He remains with good appetite. While he was at [facility] he was begun on the medication but does not remember the name.   Current Medications: Current Outpatient Prescriptions  Medication Sig Dispense Refill  . calcium carbonate (OS-CAL) 600 MG TABS Take 600 mg by mouth 2 (two) times daily with a meal.      . dicyclomine (BENTYL) 10 MG capsule Take 1 capsule (10 mg total) by mouth 3 (three) times daily as needed.  90 capsule  5  . ferrous sulfate 325 (65 FE) MG tablet Take 325 mg by mouth 2 (two) times daily.      .  fish oil-omega-3 fatty acids 1000 MG capsule Take 1 g by mouth 3 (three) times daily.      . furosemide (LASIX) 40 MG tablet Take 40 mg by mouth daily as needed for fluid.       Marland Kitchen lansoprazole (PREVACID) 30 MG capsule Take 1 capsule (30 mg total) by mouth daily.  90 capsule  3  . Multiple Vitamins-Minerals (MULTIVITAMINS THER. W/MINERALS) TABS Take 1 tablet by mouth 2 (two) times daily.       . Oxycodone HCl 10 MG TABS Take 2 tablets (20 mg total) by mouth 3 (three) times daily.  180 tablet  0  . oxymorphone (OPANA ER) 10 MG 12 hr tablet Take 1 tablet (10 mg total) by mouth at bedtime.  30 tablet  0  . predniSONE (DELTASONE) 10 MG tablet Take 3 tablets (30 mg total) by mouth daily.  90 tablet  0  . Probiotic Product (ALIGN) 4 MG CAPS Take 4 mg by mouth daily.       Marland Kitchen spironolactone (ALDACTONE) 50 MG tablet Take 50 mg by mouth daily.       . potassium chloride (K-DUR) 10 MEQ tablet Take 20 mEq by mouth daily.        No current facility-administered medications for this visit.     Objective: Blood pressure 124/72, pulse 74, temperature 97.4 F (36.3 C), temperature source Oral, resp. rate 18, height 5\' 11"  (1.803 m), weight 220 lb 6.4 oz (99.973 kg).  patient is alert and appears  to be in no acute distress. Conjunctiva is pink. Sclera is nonicteric Oropharyngeal mucosa is normal. No neck masses or thyromegaly noted. Cardiac exam with regular rhythm normal S1 and S2. No murmur or gallop noted. Lungs are clear to auscultation. Abdomen is full. Bowel sounds are normal. Abdomen is soft with barium glycol tenderness and some fullness in right lower quadrant. Liver edge is 3-4 cm and is firm and minimally tender. Spleen is not palpable. No LE edema noted.  No LE edema or clubbing noted.  Labs/studies Results: EGD and colonoscopy reports reviewed along with biopsy results via Epic everywhere. Small tubular adenoma removed from right colon stricture at ileocecal region not allowing passage of  scope across. EGD revealed grade 1 dysphasia voice is portal gastropathy. Small bowel study revealed irregularity to small bowel mucosa and dilation consistent with active disease.     Assessment:  #1. Mid abdominal pain appears to be secondary to small bowel Crohn's disease rather than related to recent endoscopic evaluation. Therapy with vedolizumab has been recommended by Dr. Marcella Dubs of [facility]. Patient's JCV antibody was positive in July, 2014. Not sure if this is an absolute contraindication. For now he will continue prednisone. 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    Plan:  Will fax copy of JCV antibody reports to Dr. Dewaine Conger office. Patient will continue prednisone at 30 mg by mouth daily for now. Patient advised to call [facility] and try to move his appointment to first week in January 2015.  Office visit in 4 months.

## 2013-11-06 NOTE — Patient Instructions (Signed)
Please call Dr. Dewaine Conger office and see if they could schedule a visit in the first week of January 2015.

## 2013-11-07 ENCOUNTER — Other Ambulatory Visit (INDEPENDENT_AMBULATORY_CARE_PROVIDER_SITE_OTHER): Payer: Self-pay | Admitting: *Deleted

## 2013-11-07 LAB — CBC
HCT: 33.4 % — ABNORMAL LOW (ref 39.0–52.0)
Hemoglobin: 10.9 g/dL — ABNORMAL LOW (ref 13.0–17.0)
MCH: 26.2 pg (ref 26.0–34.0)
MCHC: 32.6 g/dL (ref 30.0–36.0)
RBC: 4.16 MIL/uL — ABNORMAL LOW (ref 4.22–5.81)
WBC: 6.9 10*3/uL (ref 4.0–10.5)

## 2013-11-07 LAB — BASIC METABOLIC PANEL
BUN: 13 mg/dL (ref 6–23)
CO2: 26 mEq/L (ref 19–32)
Glucose, Bld: 141 mg/dL — ABNORMAL HIGH (ref 70–99)
Potassium: 4.6 mEq/L (ref 3.5–5.3)
Sodium: 137 mEq/L (ref 135–145)

## 2013-11-14 ENCOUNTER — Other Ambulatory Visit (INDEPENDENT_AMBULATORY_CARE_PROVIDER_SITE_OTHER): Payer: Self-pay | Admitting: *Deleted

## 2013-11-14 DIAGNOSIS — R109 Unspecified abdominal pain: Secondary | ICD-10-CM

## 2013-11-14 DIAGNOSIS — K50918 Crohn's disease, unspecified, with other complication: Secondary | ICD-10-CM

## 2013-11-14 MED ORDER — OXYCODONE HCL 10 MG PO TABS: 20.0000 mg | ORAL_TABLET | Freq: Three times a day (TID) | ORAL | Status: DC

## 2013-11-14 MED ORDER — OXYMORPHONE HCL ER 10 MG PO TB12: 10.0000 mg | ORAL_TABLET | Freq: Every day | ORAL | Status: DC

## 2013-11-14 NOTE — Telephone Encounter (Signed)
Patient called and requested a refill on his medications, Opana , Oxycodone. Last written 10/11/13.

## 2013-11-26 ENCOUNTER — Other Ambulatory Visit (INDEPENDENT_AMBULATORY_CARE_PROVIDER_SITE_OTHER): Payer: Self-pay | Admitting: Internal Medicine

## 2013-11-30 ENCOUNTER — Telehealth (INDEPENDENT_AMBULATORY_CARE_PROVIDER_SITE_OTHER): Payer: Self-pay | Admitting: *Deleted

## 2013-11-30 DIAGNOSIS — R609 Edema, unspecified: Secondary | ICD-10-CM

## 2013-11-30 DIAGNOSIS — K5090 Crohn's disease, unspecified, without complications: Secondary | ICD-10-CM

## 2013-11-30 NOTE — Telephone Encounter (Signed)
David [name] has called and is asking if he can increase his Lasix, to 80 mg twice a day? Currently taking Lasix 40 mg in the morning , and 40 mg in the evening. He says that he has had a 20 lbs weight gain in the last 3 days. He states that it is abdominal area down to feet. Weight this morning was 238 lbs. At the time of his ov on 11/06/13 he weighted 220 lbs 6.4 oz

## 2013-11-30 NOTE — Telephone Encounter (Signed)
Yes he can increase furosemide to 80 mg by mouth twice a day. He will need metabolic 7 and serum albumin on 12/04/2013

## 2013-11-30 NOTE — Telephone Encounter (Signed)
Patient called and made aware. Labs ordered and faxed to [facility].

## 2013-12-06 LAB — BASIC METABOLIC PANEL
CO2: 27 mEq/L (ref 19–32)
Calcium: 8.1 mg/dL — ABNORMAL LOW (ref 8.4–10.5)
Chloride: 108 mEq/L (ref 96–112)
Creat: 0.66 mg/dL (ref 0.50–1.35)
Glucose, Bld: 92 mg/dL (ref 70–99)
Sodium: 141 mEq/L (ref 135–145)

## 2013-12-06 LAB — ALBUMIN: Albumin: 3.1 g/dL — ABNORMAL LOW (ref 3.5–5.2)

## 2013-12-11 ENCOUNTER — Other Ambulatory Visit (INDEPENDENT_AMBULATORY_CARE_PROVIDER_SITE_OTHER): Payer: Self-pay | Admitting: *Deleted

## 2013-12-11 DIAGNOSIS — R109 Unspecified abdominal pain: Secondary | ICD-10-CM

## 2013-12-11 DIAGNOSIS — K50918 Crohn's disease, unspecified, with other complication: Secondary | ICD-10-CM

## 2013-12-11 NOTE — Telephone Encounter (Signed)
Patient called and has ask if he could get his prescriptions (Written). They are due to be refilled on 12/15/13. Last filled on 11-14-13.

## 2013-12-12 MED ORDER — OXYMORPHONE HCL ER 10 MG PO TB12: 10.0000 mg | ORAL_TABLET | Freq: Every day | ORAL | Status: DC

## 2013-12-12 MED ORDER — OXYCODONE HCL 10 MG PO TABS: 20.0000 mg | ORAL_TABLET | Freq: Three times a day (TID) | ORAL | Status: DC

## 2013-12-28 ENCOUNTER — Telehealth (INDEPENDENT_AMBULATORY_CARE_PROVIDER_SITE_OTHER): Payer: Self-pay | Admitting: *Deleted

## 2013-12-28 NOTE — Telephone Encounter (Signed)
Per Dr.Rehman patient is to take the Prednisone 30 mg for another 3 days. He is to take an extra 40 mg of Lasix for 3 days. Need to find out from the patient if he has started the new medication for Crohn's. Patient called and made aware. He has an appointment with Dr.Sarter on 01/11/14, its his understanding that the PA for the new medication is being worked on. I ask the patient to call our office on Monday with a progress report.

## 2013-12-28 NOTE — Telephone Encounter (Signed)
David [name] called the office and states that he is bloated, abd. is swollen, actually he is swollen from his knees upward.  His weight today is 232 lbs , at the time of his office visit 11-06-13 he weighed 220 lbs 6.4 oz. He has been on Prednisone 30 mg for 4 days now and feels no better. Not sure if it is fluid or Crohn's. Patient's next visit is 03-06-14. Forwarded to Dr.Rehman for review.

## 2013-12-28 NOTE — Telephone Encounter (Signed)
Has already been addressed 

## 2014-01-03 ENCOUNTER — Telehealth (INDEPENDENT_AMBULATORY_CARE_PROVIDER_SITE_OTHER): Payer: Self-pay | Admitting: *Deleted

## 2014-01-03 DIAGNOSIS — K5090 Crohn's disease, unspecified, without complications: Secondary | ICD-10-CM

## 2014-01-03 NOTE — Telephone Encounter (Signed)
Marquel called the office on 01-01-14 with the following progress report. Still bloated and swollen but not as bad, still feels that food isn't digesting. His weight was down to 220 lbs. Below is a copy of the recommendation given to the patient on 12-28-13. Per Dr.Rehman patient is to take the Prednisone 30 mg for another 3 days.  He is to take an extra 40 mg of Lasix for 3 days.  Need to find out from the patient if he has started the new medication for Crohn's.  Patient called and made aware. He has an appointment with Dr.Sarter on 01/11/14, its his understanding that the PA for the new medication is being worked on.  I ask the patient to call our office on Monday with a progress report.  Forwarded to Dr. Karilyn Cota for review and recommendation.

## 2014-01-03 NOTE — Telephone Encounter (Signed)
Call returned; Patient advised to stay on bland diet and eats 6 small meals. He may want to check with quadrant nature at [facility] about the recommendations for prednisone taper. Patient will need metabolic 7 unless this is done at [facility] next week.

## 2014-01-04 NOTE — Telephone Encounter (Signed)
I have tlked with David [name]. He plans to have lab drawn at [facility] next week and have them forward results to [place]. We have gone ahead and put the order in , incase they do not drawn this requested lab.

## 2014-01-10 ENCOUNTER — Other Ambulatory Visit (INDEPENDENT_AMBULATORY_CARE_PROVIDER_SITE_OTHER): Payer: Self-pay | Admitting: *Deleted

## 2014-01-10 DIAGNOSIS — R109 Unspecified abdominal pain: Secondary | ICD-10-CM

## 2014-01-10 NOTE — Telephone Encounter (Signed)
Patient called asking for written prescriptions for the Opana , Oxycodone. These both were written last on 12-11-13. He ask if he could get them before the weekend. Patient will be called when these are ready.

## 2014-01-11 MED ORDER — OXYCODONE HCL 10 MG PO TABS: 20.0000 mg | ORAL_TABLET | Freq: Three times a day (TID) | ORAL | Status: DC

## 2014-01-11 MED ORDER — OXYMORPHONE HCL ER 10 MG PO TB12: 10.0000 mg | ORAL_TABLET | Freq: Every day | ORAL | Status: DC

## 2014-01-15 ENCOUNTER — Telehealth (INDEPENDENT_AMBULATORY_CARE_PROVIDER_SITE_OTHER): Payer: Self-pay | Admitting: *Deleted

## 2014-01-15 NOTE — Telephone Encounter (Signed)
Dr.Rehman - I rec'd a call from [facility] [employer]. Patient's insurance will no longer cover the Opana. [employer] is wanting the patient to take Morphine medication, which he is allergic to. Pam ran other opitions and the cost ws to high. However , she ran [employer] 50 mcg 310 and the they would cover this for the patient , with him having a cost of $ 81.15. Would you consider this?

## 2014-01-16 ENCOUNTER — Other Ambulatory Visit (INDEPENDENT_AMBULATORY_CARE_PROVIDER_SITE_OTHER): Payer: Self-pay | Admitting: Internal Medicine

## 2014-01-16 MED ORDER — FENTANYL 50 MCG/HR TD PT72: 50.0000 ug | MEDICATED_PATCH | TRANSDERMAL | Status: DC

## 2014-01-16 NOTE — Telephone Encounter (Signed)
[name], please ask patient if he can afford fentanyl in which case I will write the prescription

## 2014-01-16 NOTE — Telephone Encounter (Signed)
Patient made aware.

## 2014-01-17 ENCOUNTER — Other Ambulatory Visit (INDEPENDENT_AMBULATORY_CARE_PROVIDER_SITE_OTHER): Payer: Self-pay | Admitting: *Deleted

## 2014-01-17 NOTE — Telephone Encounter (Signed)
Patient requesting a refill request for his Lasix, last filled 12/14.

## 2014-01-18 MED ORDER — FUROSEMIDE 40 MG PO TABS: 40.0000 mg | ORAL_TABLET | Freq: Every day | ORAL | Status: DC | PRN

## 2014-01-24 ENCOUNTER — Telehealth (INDEPENDENT_AMBULATORY_CARE_PROVIDER_SITE_OTHER): Payer: Self-pay | Admitting: *Deleted

## 2014-01-24 ENCOUNTER — Other Ambulatory Visit (INDEPENDENT_AMBULATORY_CARE_PROVIDER_SITE_OTHER): Payer: Self-pay | Admitting: Internal Medicine

## 2014-01-24 DIAGNOSIS — R188 Other ascites: Secondary | ICD-10-CM

## 2014-01-24 DIAGNOSIS — K7460 Unspecified cirrhosis of liver: Secondary | ICD-10-CM

## 2014-01-24 NOTE — Telephone Encounter (Signed)
abd tap sch'd 01/26/14 @ 100 (1245), patient aware

## 2014-01-24 NOTE — Telephone Encounter (Signed)
Patient was called and made aware of Dr.Rehman's recommendation. He ask if we would post the Tap for Friday 01/26/14. His daughter's 38 th birthday is tomorrow and he wants to spend it with her. David [name] was made aware.

## 2014-01-24 NOTE — Telephone Encounter (Signed)
Our office rec'd a call from Dr.Sartar's office from [facility]. She states that the patient was seen yesterday. He had 3+ pitting edema , profound ascites, fever of 38.6 C/ 101.48 F . Patient had been having abdominal pain for over 1 week. Chest Xray showed ascites, probable Pulmonary involvement. They wanted to admit the patient, patient wished to defer to Dr. Karilyn Cota Patient wished to defer to Dr.Rehman, as his little girl's birthday is Thursday. He is being treated for Immunity Acquired Pneumonia and was given Levaquin. The physician wanted stool sample and was unable to obtain , and feels that he needs to have a diagnostic Paracentesis.  Dr.Rehman was made aware. He ask that we arrange a paracentesis. To include cell count/diff, gram stain,anaerobic/aerobic, blood culture Patient is to rec'v Albumin 50 mg IV. Patient will be called. Forwarded to [place] to arrange.

## 2014-01-26 ENCOUNTER — Other Ambulatory Visit (INDEPENDENT_AMBULATORY_CARE_PROVIDER_SITE_OTHER): Payer: Self-pay | Admitting: Internal Medicine

## 2014-01-26 ENCOUNTER — Ambulatory Visit (HOSPITAL_COMMUNITY)
Admission: RE | Admit: 2014-01-26 | Discharge: 2014-01-26 | Disposition: A | Discharge status: Home / Self Care | Payer: Medicare Other | Source: Ambulatory Visit | Attending: Internal Medicine | Admitting: Internal Medicine

## 2014-01-26 DIAGNOSIS — K7460 Unspecified cirrhosis of liver: Secondary | ICD-10-CM

## 2014-01-26 DIAGNOSIS — R188 Other ascites: Secondary | ICD-10-CM

## 2014-01-29 ENCOUNTER — Other Ambulatory Visit (INDEPENDENT_AMBULATORY_CARE_PROVIDER_SITE_OTHER): Payer: Self-pay | Admitting: *Deleted

## 2014-01-29 MED ORDER — PREDNISONE 10 MG PO TABS: ORAL_TABLET | ORAL | Status: DC

## 2014-01-29 NOTE — Telephone Encounter (Signed)
Patient called and states that he needs a refill on his Prednisone.

## 2014-02-06 ENCOUNTER — Telehealth (INDEPENDENT_AMBULATORY_CARE_PROVIDER_SITE_OTHER): Payer: Self-pay | Admitting: *Deleted

## 2014-02-06 ENCOUNTER — Other Ambulatory Visit (INDEPENDENT_AMBULATORY_CARE_PROVIDER_SITE_OTHER): Payer: Self-pay | Admitting: Internal Medicine

## 2014-02-06 DIAGNOSIS — K7460 Unspecified cirrhosis of liver: Secondary | ICD-10-CM

## 2014-02-06 DIAGNOSIS — K7689 Other specified diseases of liver: Secondary | ICD-10-CM

## 2014-02-06 DIAGNOSIS — K5080 Crohn's disease of both small and large intestine without complications: Secondary | ICD-10-CM

## 2014-02-06 DIAGNOSIS — R609 Edema, unspecified: Secondary | ICD-10-CM

## 2014-02-06 DIAGNOSIS — E8779 Other fluid overload: Secondary | ICD-10-CM

## 2014-02-06 DIAGNOSIS — R188 Other ascites: Secondary | ICD-10-CM

## 2014-02-06 DIAGNOSIS — R160 Hepatomegaly, not elsewhere classified: Secondary | ICD-10-CM

## 2014-02-06 MED ORDER — FUROSEMIDE 40 MG PO TABS: 40.0000 mg | ORAL_TABLET | Freq: Two times a day (BID) | ORAL | Status: DC | PRN

## 2014-02-06 NOTE — Telephone Encounter (Signed)
Patient was called and made aware of Dr.Rehman's recommendation. Lab orders have been released to [facility]. Patient was ask to come by office for a weight check.

## 2014-02-06 NOTE — Telephone Encounter (Signed)
Prescription sent to patient's pharmacy; Furosemide 40 mg by mouth twice a day by mouth. Patient needs metabolic 7 and magnesium level. He also needs weeks check.

## 2014-02-06 NOTE — Telephone Encounter (Signed)
Patient called 02/05/14 requesting a refill on his Lasix. Last filled on 01/18/14. He states that he is taking Lasix 40 mg by mouth 3 times daily. In January he called ,shared that he was swollen , at that time he was advised to take a extra Lasix for 3 days. I ask Halvor who advised him to take 3 by mouth daily , and he said that he changed it himself. In addition he is taking Potassium 20 mEq , and Spirolactone 50 mg Daily. His weight is 220 lbs - 230 lbs , he states that he edema is from abdomen down to feet.  Patient advised that Dr.Rehman would be made aware and then he would be notified.

## 2014-02-07 ENCOUNTER — Telehealth (INDEPENDENT_AMBULATORY_CARE_PROVIDER_SITE_OTHER): Payer: Self-pay | Admitting: *Deleted

## 2014-02-07 NOTE — Telephone Encounter (Signed)
Patient presented to the office for a weight check .  He weighed 234.6 lbs. In office on 11/06/13 he weighed 220 lbs 6.4 oz. On January 15 patient called in c/o edema he weighed 238 lbs.

## 2014-02-07 NOTE — Telephone Encounter (Signed)
Weight check, 234.6 lbs

## 2014-02-07 NOTE — Telephone Encounter (Signed)
Patient was called and made aware. 

## 2014-02-07 NOTE — Telephone Encounter (Signed)
Patient needs to restrict by mouth fluids to 1500 mL per day. Waiting for for metabolic 7 before metolazone ordered.

## 2014-02-10 LAB — BASIC METABOLIC PANEL
BUN: 10 mg/dL (ref 6–23)
CO2: 26 meq/L (ref 19–32)
Calcium: 7.9 mg/dL — ABNORMAL LOW (ref 8.4–10.5)
Chloride: 107 mEq/L (ref 96–112)
Creat: 0.61 mg/dL (ref 0.50–1.35)
GLUCOSE: 133 mg/dL — AB (ref 70–99)
POTASSIUM: 4.1 meq/L (ref 3.5–5.3)
Sodium: 139 mEq/L (ref 135–145)

## 2014-02-10 LAB — MAGNESIUM: Magnesium: 1.7 mg/dL (ref 1.5–2.5)

## 2014-02-12 ENCOUNTER — Telehealth (INDEPENDENT_AMBULATORY_CARE_PROVIDER_SITE_OTHER): Payer: Self-pay | Admitting: Internal Medicine

## 2014-02-12 ENCOUNTER — Other Ambulatory Visit (INDEPENDENT_AMBULATORY_CARE_PROVIDER_SITE_OTHER): Payer: Self-pay | Admitting: *Deleted

## 2014-02-12 DIAGNOSIS — R109 Unspecified abdominal pain: Secondary | ICD-10-CM

## 2014-02-12 MED ORDER — OXYCODONE HCL 10 MG PO TABS: 20.0000 mg | ORAL_TABLET | Freq: Three times a day (TID) | ORAL | Status: DC

## 2014-02-12 NOTE — Telephone Encounter (Signed)
Patient called and ask that his prescription be written. Last time written was 01/10/14.

## 2014-02-12 NOTE — Telephone Encounter (Signed)
Rx for Oxycodone 10mg  written for 180 and given to patient.

## 2014-02-13 ENCOUNTER — Telehealth (INDEPENDENT_AMBULATORY_CARE_PROVIDER_SITE_OTHER): Payer: Self-pay | Admitting: *Deleted

## 2014-02-13 NOTE — Telephone Encounter (Signed)
Labs faxed to [facility]

## 2014-02-13 NOTE — Telephone Encounter (Signed)
David [name] called and has ask for an update on David [name] since their call to [place]. Per Dr.Rehman - No Tap was done due to there was no fluid, Patient is taking Lasix 40 mg BID. We may fax to them his recent lab work. I will call with some of the information.

## 2014-02-19 ENCOUNTER — Other Ambulatory Visit (INDEPENDENT_AMBULATORY_CARE_PROVIDER_SITE_OTHER): Payer: Self-pay | Admitting: *Deleted

## 2014-02-19 MED ORDER — FENTANYL 50 MCG/HR TD PT72: 50.0000 ug | MEDICATED_PATCH | TRANSDERMAL | Status: DC

## 2014-02-19 NOTE — Telephone Encounter (Signed)
Patient called for a refill on his Fentanyl Patches. They were last filled on 01-16-14.

## 2014-03-01 ENCOUNTER — Other Ambulatory Visit (INDEPENDENT_AMBULATORY_CARE_PROVIDER_SITE_OTHER): Payer: Self-pay | Admitting: *Deleted

## 2014-03-01 MED ORDER — OXYMORPHONE HCL ER 10 MG PO TB12: 10.0000 mg | ORAL_TABLET | Freq: Every day | ORAL | Status: DC

## 2014-03-01 NOTE — Telephone Encounter (Signed)
I will write prescription for Opana. when the office later today

## 2014-03-01 NOTE — Telephone Encounter (Signed)
David [name] had recently changed to the Fentanyl Patch. The first month he noted redness and itching. He ask to try another month to make sure, last filled 02-19-14, he has experienced burning, itching, redness and not helping with pain. He ask if Dr.Rehman would write the script for the Opana, he will have to pay out of pocket, but this worked for him. Last written 01/10/14.

## 2014-03-06 ENCOUNTER — Ambulatory Visit (INDEPENDENT_AMBULATORY_CARE_PROVIDER_SITE_OTHER): Payer: Medicare Other | Admitting: Internal Medicine

## 2014-03-06 ENCOUNTER — Encounter (INDEPENDENT_AMBULATORY_CARE_PROVIDER_SITE_OTHER): Payer: Self-pay | Admitting: Internal Medicine

## 2014-03-06 VITALS — BP 118/76 | HR 74 | Temp 97.9°F | Resp 18 | Ht 71.0 in | Wt 230.2 lb

## 2014-03-06 DIAGNOSIS — I309 Acute pericarditis, unspecified: Secondary | ICD-10-CM

## 2014-03-06 DIAGNOSIS — K7460 Unspecified cirrhosis of liver: Secondary | ICD-10-CM

## 2014-03-06 DIAGNOSIS — D509 Iron deficiency anemia, unspecified: Secondary | ICD-10-CM

## 2014-03-06 DIAGNOSIS — R300 Dysuria: Secondary | ICD-10-CM

## 2014-03-06 DIAGNOSIS — R109 Unspecified abdominal pain: Secondary | ICD-10-CM

## 2014-03-06 DIAGNOSIS — K5090 Crohn's disease, unspecified, without complications: Secondary | ICD-10-CM

## 2014-03-06 DIAGNOSIS — R600 Localized edema: Secondary | ICD-10-CM

## 2014-03-06 DIAGNOSIS — R609 Edema, unspecified: Secondary | ICD-10-CM

## 2014-03-06 MED ORDER — METOLAZONE 5 MG PO TABS: 5.0000 mg | ORAL_TABLET | ORAL | Status: DC | PRN

## 2014-03-06 NOTE — Patient Instructions (Addendum)
Metolazone or Zaroxolyn 5 mg by mouth every third day as needed. Begin prednisone taper; drop dose by 5 mg every week. Urine output daily for 3 days. Physician will contact you with results of Echo, blood work and urine test when completed.

## 2014-03-06 NOTE — Progress Notes (Signed)
Presenting complaint;  Lower extremity edema, abdominal pain nausea and vomiting.  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 One month ago he called with worsening abdominal pain nausea and vomiting and was begun on prednisone. 2 weeks ago he called with fluid retention and weight gain in furosemide dose was doubled. He states he has lost a few pounds but he is still 10 pounds more than he did 4 months ago. He continues to complain of upper and lower abdominal pain which is worse after meals. Over the weekend he got sick after eating pasta and vomited multiple times. He continues to have low-grade fever. Glossitis temp was 99.2. He is having 2 stools per day. Stools are semi-formed. He has occasional blood on the tissue. He also complains of passing dark concentrated urine. He's also having to get up at night 2-3 jobs in order to urinate. He also complains of hesitancy. He has been evaluated at [facility] and is in the process of getting receiving entyvio but he's not sure what is the holdup. He's also seeing Dr. Julieta Gutting of hepatology regarding his cirrhosis.  Current Medications: Outpatient Encounter Prescriptions as of 03/06/2014  Medication Sig  . calcium carbonate (OS-CAL) 600 MG TABS Take 600 mg by mouth 2 (two) times daily with a meal.  . carvedilol (COREG) 6.25 MG tablet Take 6.25 mg by mouth.  . dicyclomine (BENTYL) 10 MG capsule Take 1 capsule (10 mg total) by mouth 3 (three) times daily as needed.  . ferrous sulfate 325 (65 FE) MG tablet Take 325 mg by mouth 2 (two) times daily.  . fish oil-omega-3 fatty acids 1000 MG capsule Take 1 g by mouth 3 (three) times daily.  . furosemide (LASIX) 40 MG tablet Take 1 tablet (40 mg total) by mouth 2 (two) times daily as needed for fluid.  . Multiple Vitamins-Minerals (MULTIVITAMINS THER. W/MINERALS) TABS Take 1 tablet by mouth 2 (two)  times daily.   . Oxycodone HCl 10 MG TABS Take 2 tablets (20 mg total) by mouth 3 (three) times daily.  Marland Kitchen oxymorphone (OPANA ER) 10 MG 12 hr tablet Take 1 tablet (10 mg total) by mouth at bedtime.  . pantoprazole (PROTONIX) 40 MG tablet Take 40 mg by mouth daily.  . predniSONE (DELTASONE) 10 MG tablet TAKE THREE TABLETS BY MOUTH DAILY  . Probiotic Product (ALIGN) 4 MG CAPS Take 4 mg by mouth daily.   Marland Kitchen spironolactone (ALDACTONE) 50 MG tablet Take 50 mg by mouth daily.   . potassium chloride (K-DUR) 10 MEQ tablet Take 20 mEq by mouth daily.   . [DISCONTINUED] fentaNYL (DURAGESIC - DOSED MCG/HR) 50 MCG/HR Place 1 patch (50 mcg total) onto the skin every 3 (three) days.  . [DISCONTINUED] lansoprazole (PREVACID) 30 MG capsule Take 1 capsule (30 mg total) by mouth daily.     Objective: Blood pressure 118/76, pulse 74, temperature 97.9 F (36.6 C), temperature source Oral, resp. rate 18, height 5\' 11"  (1.803 m), weight 230 lb 3.2 oz (104.418 kg). Patient is alert and in no acute distress. Conjunctiva is pink. Sclera is nonicteric Oropharyngeal mucosa is normal. No neck masses or thyromegaly noted. Cardiac exam with regular rhythm with loud S1 and normal S2. Faint systolic ejection murmur noted at LLSB but no rub noted. Lungs are clear to auscultation. Abdomen is full. There is edema of the skin below the level of umbilicus. Abdomen is soft with tenderness in  the right lower quadrant as well as enlarged liver which is tender and firm. Spleen is nonpalpable. Flanks are dull but no shifting noted. He has 2+ pitting edema involving both legs. Slight edema also noted above the level of knees. Trace edema also noted above the level of knees.  Labs/studies Results: Metabolic 7 from 02/09/2014. Serum sodium 139, potassium 4.1, chloride 107, CO2 26, BUN 10, creatinine 0.61. Serum calcium 7.9. Serum magnesium 1.7 on 02/09/2014.  Assessment:  #1. Lower extremity edema. He is not responding well to  diuretic therapy fluid and salt restriction. Need to reassess LV function. #2. Crohn's disease. He is back on prednisone. He is having intermittent spells of vomiting. [facility]. Not sure why the delay. #3. 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 A recent ultrasound reveals minimal ascites not enough to be tapped. #4. Iron deficiency anemia. #5. History of pericarditis. Need to rule out recurrence because of fluid retention and poor response to diuretic therapy. #6. GERD. Therapy is working.  Plan:  CBC, metabolic 7, LFTs, AFP and urinalysis. Metolazone 5 mg by mouth every third day as needed. Echocardiography. Begin prednisone taper; drop dose by 5 mg every week. Patient rice to [facility] and find out when he would be able to receive entyvio. Office visit in 3 months.

## 2014-03-07 ENCOUNTER — Emergency Department (HOSPITAL_COMMUNITY): Payer: Medicare Other

## 2014-03-07 ENCOUNTER — Inpatient Hospital Stay (HOSPITAL_COMMUNITY)
Admission: EM | Admit: 2014-03-07 | Discharge: 2014-03-10 | DRG: 385 | Disposition: A | Discharge status: Home / Self Care | Payer: Medicare Other | Attending: Internal Medicine | Admitting: Internal Medicine

## 2014-03-07 ENCOUNTER — Encounter (HOSPITAL_COMMUNITY): Payer: Self-pay | Admitting: Emergency Medicine

## 2014-03-07 DIAGNOSIS — K2100 Gastro-esophageal reflux disease with esophagitis, without bleeding: Secondary | ICD-10-CM | POA: Diagnosis present

## 2014-03-07 DIAGNOSIS — E8770 Fluid overload, unspecified: Secondary | ICD-10-CM

## 2014-03-07 DIAGNOSIS — Z87442 Personal history of urinary calculi: Secondary | ICD-10-CM

## 2014-03-07 DIAGNOSIS — Z7960 Long term (current) use of unspecified immunomodulators and immunosuppressants: Secondary | ICD-10-CM

## 2014-03-07 DIAGNOSIS — K7460 Unspecified cirrhosis of liver: Secondary | ICD-10-CM | POA: Diagnosis present

## 2014-03-07 DIAGNOSIS — K219 Gastro-esophageal reflux disease without esophagitis: Secondary | ICD-10-CM

## 2014-03-07 DIAGNOSIS — Z8701 Personal history of pneumonia (recurrent): Secondary | ICD-10-CM

## 2014-03-07 DIAGNOSIS — K652 Spontaneous bacterial peritonitis: Secondary | ICD-10-CM

## 2014-03-07 DIAGNOSIS — K3184 Gastroparesis: Secondary | ICD-10-CM | POA: Diagnosis present

## 2014-03-07 DIAGNOSIS — F17200 Nicotine dependence, unspecified, uncomplicated: Secondary | ICD-10-CM | POA: Diagnosis present

## 2014-03-07 DIAGNOSIS — Z8711 Personal history of peptic ulcer disease: Secondary | ICD-10-CM

## 2014-03-07 DIAGNOSIS — R739 Hyperglycemia, unspecified: Secondary | ICD-10-CM

## 2014-03-07 DIAGNOSIS — D509 Iron deficiency anemia, unspecified: Secondary | ICD-10-CM

## 2014-03-07 DIAGNOSIS — K319 Disease of stomach and duodenum, unspecified: Secondary | ICD-10-CM | POA: Diagnosis present

## 2014-03-07 DIAGNOSIS — K50813 Crohn's disease of both small and large intestine with fistula: Secondary | ICD-10-CM | POA: Diagnosis present

## 2014-03-07 DIAGNOSIS — Z9049 Acquired absence of other specified parts of digestive tract: Secondary | ICD-10-CM

## 2014-03-07 DIAGNOSIS — T380X5A Adverse effect of glucocorticoids and synthetic analogues, initial encounter: Secondary | ICD-10-CM | POA: Diagnosis present

## 2014-03-07 DIAGNOSIS — Z79899 Other long term (current) drug therapy: Secondary | ICD-10-CM

## 2014-03-07 DIAGNOSIS — Z833 Family history of diabetes mellitus: Secondary | ICD-10-CM

## 2014-03-07 DIAGNOSIS — K7400 Hepatic fibrosis, unspecified: Secondary | ICD-10-CM

## 2014-03-07 DIAGNOSIS — R7309 Other abnormal glucose: Secondary | ICD-10-CM | POA: Diagnosis present

## 2014-03-07 DIAGNOSIS — N179 Acute kidney failure, unspecified: Secondary | ICD-10-CM

## 2014-03-07 DIAGNOSIS — I319 Disease of pericardium, unspecified: Secondary | ICD-10-CM

## 2014-03-07 DIAGNOSIS — J189 Pneumonia, unspecified organism: Secondary | ICD-10-CM | POA: Diagnosis present

## 2014-03-07 DIAGNOSIS — K5090 Crohn's disease, unspecified, without complications: Secondary | ICD-10-CM

## 2014-03-07 DIAGNOSIS — R188 Other ascites: Secondary | ICD-10-CM | POA: Diagnosis present

## 2014-03-07 DIAGNOSIS — K5080 Crohn's disease of both small and large intestine without complications: Principal | ICD-10-CM | POA: Diagnosis present

## 2014-03-07 DIAGNOSIS — R109 Unspecified abdominal pain: Secondary | ICD-10-CM | POA: Diagnosis present

## 2014-03-07 DIAGNOSIS — K632 Fistula of intestine: Secondary | ICD-10-CM | POA: Diagnosis present

## 2014-03-07 DIAGNOSIS — K7689 Other specified diseases of liver: Secondary | ICD-10-CM | POA: Diagnosis present

## 2014-03-07 DIAGNOSIS — N200 Calculus of kidney: Secondary | ICD-10-CM

## 2014-03-07 DIAGNOSIS — I10 Essential (primary) hypertension: Secondary | ICD-10-CM | POA: Diagnosis present

## 2014-03-07 DIAGNOSIS — T451X5A Adverse effect of antineoplastic and immunosuppressive drugs, initial encounter: Secondary | ICD-10-CM | POA: Diagnosis present

## 2014-03-07 DIAGNOSIS — J90 Pleural effusion, not elsewhere classified: Secondary | ICD-10-CM

## 2014-03-07 DIAGNOSIS — R3129 Other microscopic hematuria: Secondary | ICD-10-CM

## 2014-03-07 DIAGNOSIS — R600 Localized edema: Secondary | ICD-10-CM

## 2014-03-07 LAB — COMPREHENSIVE METABOLIC PANEL
ALT: 48 U/L (ref 0–53)
AST: 49 U/L — ABNORMAL HIGH (ref 0–37)
Albumin: 2.8 g/dL — ABNORMAL LOW (ref 3.5–5.2)
Alkaline Phosphatase: 197 U/L — ABNORMAL HIGH (ref 39–117)
BILIRUBIN TOTAL: 0.5 mg/dL (ref 0.3–1.2)
BUN: 13 mg/dL (ref 6–23)
CO2: 31 meq/L (ref 19–32)
Calcium: 8.6 mg/dL (ref 8.4–10.5)
Chloride: 100 mEq/L (ref 96–112)
Creatinine, Ser: 0.68 mg/dL (ref 0.50–1.35)
GLUCOSE: 132 mg/dL — AB (ref 70–99)
Potassium: 3.3 mEq/L — ABNORMAL LOW (ref 3.7–5.3)
Sodium: 140 mEq/L (ref 137–147)
Total Protein: 6.1 g/dL (ref 6.0–8.3)

## 2014-03-07 LAB — CBC WITH DIFFERENTIAL/PLATELET
BASOS PCT: 0 % (ref 0–1)
Basophils Absolute: 0 10*3/uL (ref 0.0–0.1)
EOS PCT: 2 % (ref 0–5)
Eosinophils Absolute: 0.2 10*3/uL (ref 0.0–0.7)
HEMATOCRIT: 35.2 % — AB (ref 39.0–52.0)
Hemoglobin: 10.9 g/dL — ABNORMAL LOW (ref 13.0–17.0)
LYMPHS PCT: 14 % (ref 12–46)
Lymphs Abs: 1.2 10*3/uL (ref 0.7–4.0)
MCH: 24.1 pg — ABNORMAL LOW (ref 26.0–34.0)
MCHC: 31 g/dL (ref 30.0–36.0)
MCV: 77.9 fL — ABNORMAL LOW (ref 78.0–100.0)
Monocytes Absolute: 0.9 10*3/uL (ref 0.1–1.0)
Monocytes Relative: 10 % (ref 3–12)
NEUTROS ABS: 6.5 10*3/uL (ref 1.7–7.7)
Neutrophils Relative %: 74 % (ref 43–77)
Platelets: 146 10*3/uL — ABNORMAL LOW (ref 150–400)
RBC: 4.52 MIL/uL (ref 4.22–5.81)
RDW: 17.9 % — ABNORMAL HIGH (ref 11.5–15.5)
WBC: 8.8 10*3/uL (ref 4.0–10.5)

## 2014-03-07 LAB — URINALYSIS, ROUTINE W REFLEX MICROSCOPIC
Glucose, UA: NEGATIVE mg/dL
Leukocytes, UA: NEGATIVE
Nitrite: NEGATIVE
PROTEIN: 100 mg/dL — AB
Specific Gravity, Urine: 1.025 (ref 1.005–1.030)
UROBILINOGEN UA: 1 mg/dL (ref 0.0–1.0)
pH: 7 (ref 5.0–8.0)

## 2014-03-07 LAB — PROTIME-INR
INR: 1.04 (ref 0.00–1.49)
Prothrombin Time: 13.4 seconds (ref 11.6–15.2)

## 2014-03-07 LAB — URINE MICROSCOPIC-ADD ON

## 2014-03-07 LAB — APTT: aPTT: 29 seconds (ref 24–37)

## 2014-03-07 MED ORDER — SODIUM CHLORIDE 0.9 % IV SOLN: 250.0000 mL | INTRAVENOUS | Status: DC | PRN

## 2014-03-07 MED ORDER — IOHEXOL 300 MG/ML  SOLN
100.0000 mL | Freq: Once | INTRAMUSCULAR | Status: AC | PRN
  Administered 2014-03-07: 100 mL via INTRAVENOUS

## 2014-03-07 MED ORDER — FUROSEMIDE 10 MG/ML IJ SOLN
60.0000 mg | Freq: Two times a day (BID) | INTRAMUSCULAR | Status: DC
  Administered 2014-03-08 (×2): 60 mg via INTRAVENOUS
  Filled 2014-03-07 (×2): qty 6

## 2014-03-07 MED ORDER — URSODIOL 300 MG PO CAPS
ORAL_CAPSULE | ORAL | Status: AC
  Filled 2014-03-07: qty 2

## 2014-03-07 MED ORDER — SODIUM CHLORIDE 0.9 % IJ SOLN: 3.0000 mL | INTRAMUSCULAR | Status: DC | PRN

## 2014-03-07 MED ORDER — PANTOPRAZOLE SODIUM 40 MG PO TBEC
40.0000 mg | DELAYED_RELEASE_TABLET | Freq: Every day | ORAL | Status: DC
  Administered 2014-03-08 – 2014-03-09 (×2): 40 mg via ORAL
  Filled 2014-03-07 (×2): qty 1

## 2014-03-07 MED ORDER — PREDNISONE 20 MG PO TABS: 30.0000 mg | ORAL_TABLET | Freq: Every day | ORAL | Status: DC

## 2014-03-07 MED ORDER — HYDROMORPHONE HCL PF 1 MG/ML IJ SOLN
1.0000 mg | Freq: Once | INTRAMUSCULAR | Status: AC
  Administered 2014-03-07: 1 mg via INTRAVENOUS
  Filled 2014-03-07: qty 1

## 2014-03-07 MED ORDER — ONDANSETRON HCL 4 MG/2ML IJ SOLN
4.0000 mg | Freq: Four times a day (QID) | INTRAMUSCULAR | Status: DC | PRN
  Administered 2014-03-08: 4 mg via INTRAVENOUS
  Filled 2014-03-07: qty 2

## 2014-03-07 MED ORDER — LIDOCAINE HCL (PF) 1 % IJ SOLN
INTRAMUSCULAR | Status: AC
  Filled 2014-03-07: qty 5

## 2014-03-07 MED ORDER — IOHEXOL 300 MG/ML  SOLN
50.0000 mL | Freq: Once | INTRAMUSCULAR | Status: AC | PRN
  Administered 2014-03-07: 50 mL via ORAL

## 2014-03-07 MED ORDER — ACETAMINOPHEN 325 MG PO TABS: 650.0000 mg | ORAL_TABLET | Freq: Four times a day (QID) | ORAL | Status: DC | PRN

## 2014-03-07 MED ORDER — ONDANSETRON HCL 4 MG PO TABS: 4.0000 mg | ORAL_TABLET | Freq: Four times a day (QID) | ORAL | Status: DC | PRN

## 2014-03-07 MED ORDER — DEXTROSE 5 % IV SOLN
1.0000 g | Freq: Once | INTRAVENOUS | Status: AC
  Administered 2014-03-07: 1 g via INTRAVENOUS
  Filled 2014-03-07: qty 10

## 2014-03-07 MED ORDER — POTASSIUM CHLORIDE CRYS ER 20 MEQ PO TBCR
40.0000 meq | EXTENDED_RELEASE_TABLET | Freq: Every day | ORAL | Status: DC
  Administered 2014-03-08 – 2014-03-10 (×3): 40 meq via ORAL
  Filled 2014-03-07 (×3): qty 2

## 2014-03-07 MED ORDER — URSODIOL 300 MG PO CAPS
600.0000 mg | ORAL_CAPSULE | Freq: Two times a day (BID) | ORAL | Status: DC
  Administered 2014-03-08 – 2014-03-10 (×6): 600 mg via ORAL
  Filled 2014-03-07 (×10): qty 2

## 2014-03-07 MED ORDER — HEPARIN SODIUM (PORCINE) 5000 UNIT/ML IJ SOLN
5000.0000 [IU] | Freq: Three times a day (TID) | INTRAMUSCULAR | Status: DC
  Administered 2014-03-08 (×3): 5000 [IU] via SUBCUTANEOUS
  Filled 2014-03-07 (×3): qty 1

## 2014-03-07 MED ORDER — SPIRONOLACTONE 25 MG PO TABS
50.0000 mg | ORAL_TABLET | Freq: Every day | ORAL | Status: DC
  Administered 2014-03-08 – 2014-03-10 (×3): 50 mg via ORAL
  Filled 2014-03-07 (×3): qty 2

## 2014-03-07 MED ORDER — ONDANSETRON HCL 4 MG/2ML IJ SOLN
4.0000 mg | Freq: Once | INTRAMUSCULAR | Status: AC
  Administered 2014-03-07: 4 mg via INTRAVENOUS
  Filled 2014-03-07: qty 2

## 2014-03-07 MED ORDER — OXYCODONE HCL 5 MG PO TABS
5.0000 mg | ORAL_TABLET | ORAL | Status: DC | PRN
  Administered 2014-03-08 – 2014-03-10 (×4): 5 mg via ORAL
  Filled 2014-03-07 (×4): qty 1

## 2014-03-07 MED ORDER — CARVEDILOL 3.125 MG PO TABS
6.2500 mg | ORAL_TABLET | Freq: Two times a day (BID) | ORAL | Status: DC
  Administered 2014-03-08 – 2014-03-10 (×6): 6.25 mg via ORAL
  Filled 2014-03-07 (×6): qty 2

## 2014-03-07 MED ORDER — POTASSIUM CHLORIDE CRYS ER 20 MEQ PO TBCR
40.0000 meq | EXTENDED_RELEASE_TABLET | Freq: Once | ORAL | Status: AC
  Administered 2014-03-08: 40 meq via ORAL
  Filled 2014-03-07: qty 2

## 2014-03-07 MED ORDER — ACETAMINOPHEN 650 MG RE SUPP: 650.0000 mg | Freq: Four times a day (QID) | RECTAL | Status: DC | PRN

## 2014-03-07 MED ORDER — OXYCODONE HCL ER 10 MG PO T12A
10.0000 mg | EXTENDED_RELEASE_TABLET | Freq: Every day | ORAL | Status: DC
  Administered 2014-03-08 – 2014-03-09 (×3): 10 mg via ORAL
  Filled 2014-03-07 (×3): qty 1

## 2014-03-07 MED ORDER — DEXTROSE 5 % IV SOLN
1.0000 g | INTRAVENOUS | Status: DC
  Filled 2014-03-07: qty 10

## 2014-03-07 MED ORDER — DICYCLOMINE HCL 10 MG PO CAPS: 10.0000 mg | ORAL_CAPSULE | Freq: Three times a day (TID) | ORAL | Status: DC | PRN

## 2014-03-07 MED ORDER — HYDROMORPHONE HCL PF 1 MG/ML IJ SOLN
1.0000 mg | INTRAMUSCULAR | Status: DC | PRN
  Administered 2014-03-08 – 2014-03-10 (×17): 1 mg via INTRAVENOUS
  Filled 2014-03-07 (×17): qty 1

## 2014-03-07 MED ORDER — SODIUM CHLORIDE 0.9 % IJ SOLN
3.0000 mL | Freq: Two times a day (BID) | INTRAMUSCULAR | Status: DC
  Administered 2014-03-08 – 2014-03-09 (×2): 3 mL via INTRAVENOUS

## 2014-03-07 MED ORDER — ALBUTEROL SULFATE (2.5 MG/3ML) 0.083% IN NEBU: 2.5000 mg | INHALATION_SOLUTION | RESPIRATORY_TRACT | Status: DC | PRN

## 2014-03-07 NOTE — H&P (Signed)
[facility] History and Physical  David [name] MRN:[id number] DOB: 04/26/1976 DOA: 03/07/2014   PCP: LUKING,W S, MD  Specialists: Patient's gastroenterologist is Dr. Rehman. He's also followed by the GI clinic in [facility]  Chief Complaint: Abdominal pain with nausea and vomiting  HPI: David [name] is a 37 y.o. 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 He had 2 episodes of vomiting at that time without any blood. He felt bloated. However, the symptoms started resolving. He went to see his gastroenterologist yesterday and he was feeling better at that time. But then last night he started developing the pain again. The pain has been sharp, continuous, 10 out of 10 in intensity and all over the abdomen but more so on the right side. He has had nausea, but no further episodes of emesis. He has diarrhea on a chronic basis, which is unchanged. Denies any blood in the stool. He had temperature up to 99F. He has felt more distended in the abdomen and has noticed more swelling in his legs. No shortness of breath. No dizziness. After receiving pain medicines in the ED, he is feeling slightly better.  Home Medications: Prior to Admission medications   Medication Sig Start Date End Date Taking? Authorizing Provider  calcium carbonate (OS-CAL) 600 MG TABS Take 600 mg by mouth 2 (two) times daily with a meal.   Yes Historical Provider, MD  carvedilol (COREG) 6.25 MG tablet Take 6.25 mg by mouth 2 (two) times daily.  10/23/13 10/23/14 Yes Historical Provider, MD  dicyclomine (BENTYL) 10 MG capsule Take 10 mg by mouth 3 (three) times daily as needed for spasms. Muscle spasm   Yes Historical Provider, MD  ferrous sulfate 325 (65 FE) MG tablet Take 325 mg by mouth 2 (two) times daily. 11/22/12  Yes Wayne E Gold, PA-C  fish  oil-omega-3 fatty acids 1000 MG capsule Take 1 g by mouth 3 (three) times daily. 02/09/12  Yes Najeeb U Rehman, MD  furosemide (LASIX) 40 MG tablet Take 1 tablet (40 mg total) by mouth 2 (two) times daily as needed for fluid. 02/06/14  Yes Najeeb U Rehman, MD  furosemide (LASIX) 40 MG tablet Take 80 mg by mouth daily.   Yes Historical Provider, MD  Multiple Vitamins-Minerals (MULTIVITAMINS THER. W/MINERALS) TABS Take 1 tablet by mouth 2 (two) times daily.    Yes Historical Provider, MD  Oxycodone HCl 10 MG TABS Take 2 tablets (20 mg total) by mouth 3 (three) times daily. 02/12/14  Yes [name] L [name], NP  oxymorphone (OPANA ER) 10 MG 12 hr tablet Take 1 tablet (10 mg total) by mouth at bedtime. 03/01/14  Yes Najeeb U Rehman, MD  pantoprazole (PROTONIX) 40 MG tablet Take 40 mg by mouth daily.   Yes Historical Provider, MD  potassium chloride (K-DUR,KLOR-CON) 10 MEQ tablet Take 20 mEq by mouth daily.   Yes Historical Provider, MD  predniSONE (DELTASONE) 10 MG tablet Take 30 mg by mouth daily.   Yes Historical Provider, MD  Probiotic Product (ALIGN) 4 MG CAPS Take 4 mg by mouth daily.    Yes Historical Provider, MD  promethazine (PHENERGAN) 25 MG tablet Take 25 mg by mouth every 6 (six) hours as needed. nausea   Yes Historical Provider, MD  spironolactone (ALDACTONE) 50 MG tablet Take 50 mg by mouth   daily.  10/31/12  Yes Najeeb U Rehman, MD  ursodiol (ACTIGALL) 300 MG capsule Take 600 mg by mouth 2 (two) times daily.   Yes Historical Provider, MD  metolazone (ZAROXOLYN) 5 MG tablet Take 1 tablet (5 mg total) by mouth every three (3) days as needed. 03/06/14   Najeeb U Rehman, MD    Allergies:  Allergies  Allergen Reactions  . Remicade [Infliximab]     Dr Rehman states previous respiratory arrest not related to remicade. Dr Rehman states remicade not a drug related allergy. 07-07-2013 at 1025 rapid response called to PACU- Patient difficulty breathing after infusion of Remicade infusing. Do NOT Give  Remicade!  . Fentanyl And Related Itching    Swelling, Redness  . Wellbutrin [Bupropion] Nausea And Vomiting  . Morphine And Related Hives    Past Medical History: Past Medical History  Diagnosis Date  . 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 Hepatomegaly 05/04/2011  . Fatty liver 05/04/2011    "stage III fatty liver fibrosis" (11/15/2012)  . GERD (gastroesophageal reflux disease)   . Chronic liver disease     /notes 11/15/2012  . Pericarditis     /notes 11/15/2012  . Hypertension   . Pneumonia 1977  . Shortness of breath     "all the time right now" (11/15/2012)  . History of blood transfusion 2004  . History of stomach ulcers   . Duodenal ulcer   . Depression   . Kidney stones     bilaterally/notes 11/15/2012  . Hepatic fibrosis     /notes 11/15/2012  . Anemia   . Pericardial effusion 10/29/2012    moderate to large/notes 11/15/2012  . Anxiety   . Hepatitis   . Crohn's disease   . ED (erectile dysfunction)     Past Surgical History  Procedure Laterality Date  . Anal examination under anesthesia  02/11/2011    [name]  . Treatment fistula anal  07/03/11    This was a second surgery to repair Anal fistula  . Small intestine surgery  2004    "had hole cut in small intestines during endoscopy; had to have OR & leave me open 3 months" (11/15/2012)  . Cholecystectomy  ~ 2003  . Appendectomy  ~ 2003  . Video assisted thoracoscopy  11/17/2012    Procedure: VIDEO ASSISTED THORACOSCOPY;  Surgeon: Steven C Hendrickson, MD;  Location: [facility] OR;  Service: Thoracic;  Laterality: Left;  drainage of left pleural effusion  . Pericardial window  11/17/2012    Procedure: PERICARDIAL WINDOW;  Surgeon: Steven C Hendrickson, MD;  Location: [facility] OR;  Service: Thoracic;  Laterality: N/A;  . Esophagogastroduodenoscopy  01/06/2013    Procedure: ESOPHAGOGASTRODUODENOSCOPY (EGD);  Surgeon: Najeeb U Rehman, MD;   Location: [facility];  Service: Endoscopy;  Laterality: N/A;  11:15    Social History: He lives with his wife and children in [place]. He is unemployed. Smokes 2 packs of cigarettes on a weekly basis. Denies any alcohol use. Declines nicotine patch. Usually independent with daily activities   Family History:  Family History  Problem Relation Age of Onset  . Diabetes Mother   . Diabetes Brother   . Healthy Daughter   . Healthy Son      Review of Systems - History obtained from the patient General ROS: positive for  - fatigue and fever Psychological ROS: negative Ophthalmic ROS: negative ENT ROS: negative Allergy and Immunology ROS:   negative Hematological and Lymphatic ROS: negative Endocrine ROS: negative Respiratory ROS: no cough, shortness of breath, or wheezing Cardiovascular ROS: no chest pain or dyspnea on exertion Gastrointestinal ROS: as in hpi Genito-Urinary ROS: no dysuria, trouble voiding, or hematuria Musculoskeletal ROS: negative Neurological ROS: no TIA or stroke symptoms Dermatological ROS: negative  Physical Examination  Filed Vitals:   03/07/14 1842 03/07/14 1855  BP: 132/75   Pulse: 98   Temp: 98.5 F (36.9 C) 99.9 F (37.7 C)  TempSrc: Oral Rectal  Resp: 20   Height: 5' 11" (1.803 m)   Weight: 104.327 kg (230 lb)   SpO2: 100%     BP 132/75  Pulse 98  Temp(Src) 99.9 F (37.7 C) (Rectal)  Resp 20  Ht 5' 11" (1.803 m)  Wt 104.327 kg (230 lb)  BMI 32.09 kg/m2  SpO2 100%  General appearance: alert, cooperative, appears stated age and no distress Head: Normocephalic, without obvious abnormality, atraumatic Eyes: conjunctivae/corneas clear. PERRL, EOM's intact. Throat: lips, mucosa, and tongue normal; teeth and gums normal Neck: no adenopathy, no carotid bruit, no JVD, supple, symmetrical, trachea midline and thyroid not enlarged, symmetric, no tenderness/mass/nodules Resp: clear to auscultation bilaterally Cardio: regular rate and  rhythm, S1, S2 normal, no murmur, click, rub or gallop GI: Abdomen is distended. Tenderness is present without any rebound, rigidity, or guarding. Tenderness is mostly in the right side of the abdomen. Scars from previous surgery noted. No specific organomegaly appreciated. Extremities: edema He does have 1-2+ pitting edema. Bilateral lower extremities. The left leg appears to be slightly larger compared to the right. Pulses: 2+ and symmetric Skin: Skin color, texture, turgor normal. No rashes or lesions Lymph nodes: Cervical, supraclavicular, and axillary nodes normal. Neurologic: He is alert and oriented x3. No focal neurological deficits appreciated  Laboratory Data: Results for orders placed during the hospital encounter of 03/07/14 (from the past 48 hour(s))  CBC WITH DIFFERENTIAL     Status: Abnormal   Collection Time    03/07/14  7:05 PM      Result Value Ref Range   WBC 8.8  4.0 - 10.5 K/uL   RBC 4.52  4.22 - 5.81 MIL/uL   Hemoglobin 10.9 (*) 13.0 - 17.0 g/dL   HCT 35.2 (*) 39.0 - 52.0 %   MCV 77.9 (*) 78.0 - 100.0 fL   MCH 24.1 (*) 26.0 - 34.0 pg   MCHC 31.0  30.0 - 36.0 g/dL   RDW 17.9 (*) 11.5 - 15.5 %   Platelets 146 (*) 150 - 400 K/uL   Comment: RESULT REPEATED AND VERIFIED   Neutrophils Relative % 74  43 - 77 %   Lymphocytes Relative 14  12 - 46 %   Monocytes Relative 10  3 - 12 %   Eosinophils Relative 2  0 - 5 %   Basophils Relative 0  0 - 1 %   Neutro Abs 6.5  1.7 - 7.7 K/uL   Lymphs Abs 1.2  0.7 - 4.0 K/uL   Monocytes Absolute 0.9  0.1 - 1.0 K/uL   Eosinophils Absolute 0.2  0.0 - 0.7 K/uL   Basophils Absolute 0.0  0.0 - 0.1 K/uL   RBC Morphology ELLIPTOCYTES     Smear Review PLATELET COUNT CONFIRMED BY SMEAR    COMPREHENSIVE METABOLIC PANEL     Status: Abnormal   Collection Time    03/07/14  7:05 PM      Result Value Ref Range   Sodium 140  137 - 147   mEq/L   Potassium 3.3 (*) 3.7 - 5.3 mEq/L   Chloride 100  96 - 112 mEq/L   CO2 31  19 - 32 mEq/L    Glucose, Bld 132 (*) 70 - 99 mg/dL   BUN 13  6 - 23 mg/dL   Creatinine, Ser 0.68  0.50 - 1.35 mg/dL   Calcium 8.6  8.4 - 10.5 mg/dL   Total Protein 6.1  6.0 - 8.3 g/dL   Albumin 2.8 (*) 3.5 - 5.2 g/dL   AST 49 (*) 0 - 37 U/L   ALT 48  0 - 53 U/L   Alkaline Phosphatase 197 (*) 39 - 117 U/L   Total Bilirubin 0.5  0.3 - 1.2 mg/dL   GFR calc non Af Amer >90  >90 mL/min   GFR calc Af Amer >90  >90 mL/min   Comment: (NOTE)     The eGFR has been calculated using the CKD EPI equation.     This calculation has not been validated in all clinical situations.     eGFR's persistently <90 mL/min signify possible Chronic Kidney     Disease.  PROTIME-INR     Status: None   Collection Time    03/07/14  7:05 PM      Result Value Ref Range   Prothrombin Time 13.4  11.6 - 15.2 seconds   INR 1.04  0.00 - 1.49  APTT     Status: None   Collection Time    03/07/14  7:05 PM      Result Value Ref Range   aPTT 29  24 - 37 seconds    Radiology Reports: No results found.   Problem List  Principal Problem:   Abdominal pain Active Problems:   Crohn's disease of both small and large intestine with fistula   GERD (gastroesophageal reflux disease)   Ascites   Liver cirrhosis   Assessment: This is a 37-year-old, Caucasian male, with a history of Crohn's disease, and liver cirrhosis who presents with abdominal pain, nausea, vomiting, low-grade fever. He has abdominal distention but doesn't appear to have large ascites. Differential diagnoses for his symptomatology is broad, including complication of Crohn's disease, spontaneous bacterial peritonitis, small bowel obstruction.  Plan: #1 abdominal pain with nausea, vomiting, and low-grade fever: He has been given a dose of ceftriaxone in the emergency department. CT scan of his abdomen and pelvis is pending at this time. He can be admitted to MedSurg bed. We will consult gastroenterology. Await results of CT scan. Keep him n.p.o. at this time. Depending on  the results of CT scan further management can be decided. Check UA.  #2 history of Crohn's disease with fistula's in the past: His last abdominal surgery was in 2010 at [facility]. He has had some bowel resection as well back in 2004. Continue with his current dose of prednisone for now. Depending on the CT scan we may have to alter treatment.  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 Await CT scan. He is significantly distended in the abdomen. He also has significant lower extremity edema with the left leg appears to be slightly larger than the right. We'll get venous Doppler. We'll give him intravenous Lasix for now. Continue with spironolactone.  #4 microcytic anemia: Hemoglobin appears to be close to baseline. Continue to monitor   DVT Prophylaxis: Heparin subcutaneously Code Status: Full code Family Communication: Discussed with the patient  Disposition Plan: Admit   to MedSurg.   Further management decisions will depend on results of further testing and patient's response to treatment.   [facility]  [facility] Pager [phone]  If 7PM-7AM, please contact night-coverage [email or web address] Password [facility]  03/07/2014, 8:41 PM

## 2014-03-07 NOTE — ED Provider Notes (Signed)
CSN: [id number]     Arrival date & time 03/07/14  1837 History  This chart was scribed for David B. Bernette Mayers, MD by Bennett Scrape, ED Scribe. This patient was seen in room APA11/APA11 and the patient's care was started at 6:57 PM.  Chief Complaint  Patient presents with  . Abdominal Pain     The history is provided by the patient. No language interpreter was used.    HPI Comments: David [name] is a 38 y.o. male who presents to the Emergency Department complaining of abdominal pain that started last night with associated low grade fever between 99 and 100, nausea and diarrhea. Temperature is 98.5. He also reports worsening of his chronic abdominal distention due to cirrhosis from methotrexate tx for Crohn's. He states that he has gotten paracenteses in the past with the last one being a few weeks ago. However, he reports that there was not enough fluid to draw off at the time. He denies any recent antibiotic use. Last use was for PNA one month ago. He denies any associated emesis or hematochezia.   GI is Dr. Karilyn Cota PCP is Dr. Gerda Diss   Past Medical History  Diagnosis Date  . 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 Hepatomegaly 05/04/2011  . Fatty liver 05/04/2011    "stage III fatty liver fibrosis" (11/15/2012)  . GERD (gastroesophageal reflux disease)   . Chronic liver disease     /notes 11/15/2012  . Pericarditis     David [name] 11/15/2012  . Hypertension   . Pneumonia 1977  . Shortness of breath     "all the time right now" (11/15/2012)  . History of blood transfusion 2004  . History of stomach ulcers   . Duodenal ulcer   . Depression   . Kidney stones     bilaterally/notes 11/15/2012  . Hepatic fibrosis     David [name] 11/15/2012  . Anemia   . Pericardial effusion 10/29/2012    moderate to large/notes 11/15/2012  . Anxiety   . Hepatitis   . Crohn's disease   . ED (erectile dysfunction)    Past  Surgical History  Procedure Laterality Date  . Anal examination under anesthesia  02/11/2011    [name]  . Treatment fistula anal  07/03/11    This was a second surgery to repair Anal fistula  . Small intestine surgery  2004    "had hole cut in small intestines during endoscopy; had to have OR & leave me open 3 months" (11/15/2012)  . Cholecystectomy  ~ 2003  . Appendectomy  ~ 2003  . Video assisted thoracoscopy  11/17/2012    Procedure: VIDEO ASSISTED THORACOSCOPY;  Surgeon: David Slot, MD;  Location: [facility] OR;  Service: Thoracic;  Laterality: Left;  drainage of left pleural effusion  . Pericardial window  11/17/2012    Procedure: PERICARDIAL WINDOW;  Surgeon: David Slot, MD;  Location: [facility] OR;  Service: Thoracic;  Laterality: N/A;  . Esophagogastroduodenoscopy  01/06/2013    Procedure: ESOPHAGOGASTRODUODENOSCOPY (EGD);  Surgeon: David Hippo, MD;  Location: [facility];  Service: Endoscopy;  Laterality: N/A;  11:15   Family History  Problem Relation Age of Onset  . Diabetes Mother   . Diabetes Brother   . Healthy Daughter   . Healthy Son    History  Substance Use Topics  . Smoking status: Light Tobacco Smoker -- 0.50 packs/day for 24 years  Types: Cigarettes    Start date: 10/28/2012    Last Attempt to Quit: 11/22/2012  . Smokeless tobacco: Current User    Types: Snuff     Comment: 1/2 pack a month  . Alcohol Use: No    Review of Systems  A complete 10 system review of systems was obtained and all systems are negative except as noted in the HPI and PMH.    Allergies  Remicade; Fentanyl and related; Wellbutrin; and Morphine and related  Home Medications   Current Outpatient Rx  Name  Route  Sig  Dispense  Refill  . calcium carbonate (OS-CAL) 600 MG TABS   Oral   Take 600 mg by mouth 2 (two) times daily with a meal.         . carvedilol (COREG) 6.25 MG tablet   Oral   Take 6.25 mg by mouth.         . dicyclomine (BENTYL) 10 MG capsule    Oral   Take 1 capsule (10 mg total) by mouth 3 (three) times daily as needed.   90 capsule   5   . ferrous sulfate 325 (65 FE) MG tablet   Oral   Take 325 mg by mouth 2 (two) times daily.         . fish oil-omega-3 fatty acids 1000 MG capsule   Oral   Take 1 g by mouth 3 (three) times daily.         . furosemide (LASIX) 40 MG tablet   Oral   Take 1 tablet (40 mg total) by mouth 2 (two) times daily as needed for fluid.   60 tablet   1   . metolazone (ZAROXOLYN) 5 MG tablet   Oral   Take 1 tablet (5 mg total) by mouth every three (3) days as needed.   10 tablet   0   . Multiple Vitamins-Minerals (MULTIVITAMINS THER. W/MINERALS) TABS   Oral   Take 1 tablet by mouth 2 (two) times daily.          . Oxycodone HCl 10 MG TABS   Oral   Take 2 tablets (20 mg total) by mouth 3 (three) times daily.   180 tablet   0   . oxymorphone (OPANA ER) 10 MG 12 hr tablet   Oral   Take 1 tablet (10 mg total) by mouth at bedtime.   30 tablet   0   . pantoprazole (PROTONIX) 40 MG tablet   Oral   Take 40 mg by mouth daily.         David [name] EXPIRED: potassium chloride (K-DUR) 10 MEQ tablet   Oral   Take 20 mEq by mouth daily.          . predniSONE (DELTASONE) 10 MG tablet      TAKE THREE TABLETS BY MOUTH DAILY   90 tablet   0   . Probiotic Product (ALIGN) 4 MG CAPS   Oral   Take 4 mg by mouth daily.          David [name] spironolactone (ALDACTONE) 50 MG tablet   Oral   Take 50 mg by mouth daily.           Triage Vitals: BP 132/75  Pulse 98  Temp(Src) 98.5 F (36.9 C) (Oral)  Resp 20  Ht 5\' 11"  (1.803 m)  Wt 230 lb (104.327 kg)  BMI 32.09 kg/m2  SpO2 100%  Physical Exam  Nursing note and vitals reviewed. Constitutional: He is  oriented to person, place, and time. He appears well-developed and well-nourished.  HENT:  Head: Normocephalic and atraumatic.  Eyes: EOM are normal. Pupils are equal, round, and reactive to light.  Neck: Normal range of motion. Neck supple.   Cardiovascular: Normal rate, regular rhythm, normal heart sounds and intact distal pulses.   Pulmonary/Chest: Effort normal and breath sounds normal.  Abdominal: Bowel sounds are normal. He exhibits distension. There is tenderness (diffuse). There is guarding.  Musculoskeletal: Normal range of motion. He exhibits no edema and no tenderness.  Neurological: He is alert and oriented to person, place, and time. He has normal strength. No cranial nerve deficit or sensory deficit.  Skin: Skin is warm and dry. No rash noted.  Psychiatric: He has a normal mood and affect.    ED Course  Procedures (including critical care time)  Medications  ondansetron (ZOFRAN) injection 4 mg (not administered)  HYDROmorphone (DILAUDID) injection 1 mg (not administered)    DIAGNOSTIC STUDIES: Oxygen Saturation is 100% on RA, normal by my interpretation.    COORDINATION OF CARE: 7:03 PM-Rectal temp is 99.9 in the ED. Discussed treatment plan which includes CBC, CMP, UA and admission with plans for paracentesis with pt at bedside and pt agreed to plan.   Labs Review Labs Reviewed  CBC WITH DIFFERENTIAL - Abnormal; Notable for the following:    Hemoglobin 10.9 (*)    HCT 35.2 (*)    MCV 77.9 (*)    MCH 24.1 (*)    RDW 17.9 (*)    Platelets 146 (*)    All other components within normal limits  COMPREHENSIVE METABOLIC PANEL - Abnormal; Notable for the following:    Potassium 3.3 (*)    Glucose, Bld 132 (*)    Albumin 2.8 (*)    AST 49 (*)    Alkaline Phosphatase 197 (*)    All other components within normal limits  URINALYSIS, ROUTINE W REFLEX MICROSCOPIC - Abnormal; Notable for the following:    Color, Urine AMBER (*)    Hgb urine dipstick LARGE (*)    Bilirubin Urine SMALL (*)    Ketones, ur TRACE (*)    Protein, ur 100 (*)    All other components within normal limits  URINE MICROSCOPIC-ADD ON - Abnormal; Notable for the following:    Squamous Epithelial / LPF FEW (*)    Bacteria, UA FEW (*)     All other components within normal limits  PROTIME-INR  APTT  COMPREHENSIVE METABOLIC PANEL  CBC   Imaging Review Ct Abdomen Pelvis W Contrast  03/07/2014   CLINICAL DATA:  Abdominal pain.  EXAM: CT ABDOMEN AND PELVIS WITH CONTRAST  TECHNIQUE: Multidetector CT imaging of the abdomen and pelvis was performed using the standard protocol following bolus administration of intravenous contrast.  CONTRAST:  100mL OMNIPAQUE IOHEXOL 300 MG/ML SOLN, 50mL OMNIPAQUE IOHEXOL 300 MG/ML SOLN  COMPARISON:  US ABDOMEN LIMITED dated 01/26/2014; CT ABD/PELV WO CM dated 06/13/2013  FINDINGS: Poorly defined area of increased density with interstitial and airspace components in the posterior lateral right lung base. A 1.5 cm sub solid focal density is appreciated posteriorly right middle lobe image 2 series 3.  The liver demonstrates a fine micronodular border. No focal abnormalities appreciated. Spleen is enlarged stable when compared to previous study. Diffuse mild ascites is appreciated within the abdomen and pelvis. There is diffuse edema within the intraperitoneal fat. Diffuse concentric bowel wall thickening appreciated involving the ascending, transverse, and portions of the proximal and mid descending  colon. Moderate fecal retention is appreciated within the descending colon.  Multiple dilated loops of small bowel are appreciated within the left upper quadrant in appear to primarily involve the jejunum. The ileum demonstrates a more normal caliber. There is diffuse concentric wall thickening involving the distal and terminal portions of the ileum.  No drainable loculated fluid collections are appreciated. There is anasarca within the abdominal wall.  Stable cysts is appreciated within the right kidney. Small bilateral extrarenal pelvises are appreciated. A nonobstructing 2 mm calculus midpole right kidney. Kidneys otherwise unremarkable. The pancreas and adrenals are unremarkable.  There is no evidence of abdominal  aortic aneurysm. A small hiatal hernia is identified. The celiac, SMA, IMA, portal vein, SMV are opacified.  There is no evidence of abdominal or pelvic masses nor pathologic sized adenopathy.  IMPRESSION: 1. Large small bowel findings consistent with diffuse active Crohn's disease. Reactive ileus versus a partial or early small bowel obstruction primarily involving the jejunum. 2. Findings within the right middle lobe and right lung base likely reflecting interstitial pneumonitis. Further evaluation with dedicated chest CT is recommended considering the nodular appearance of the right middle lobe finding. 3. Changes of anasarca within the abdominal wall and inflammatory changes within the abdominal pelvic fat. Mild amount of ascites. 4. Stable right renal cyst   Electronically Signed   By: Salome Holmes M.D.   On: 03/07/2014 23:12     EKG Interpretation None      MDM   Final diagnoses:  SBP (spontaneous bacterial peritonitis)   Pt with abd pain and low grade fever in setting of cirrhosis and abdominal distention concerning for SBP. No definite fluid collection seen with limited bedside ultrasound, so Rocephin given empirically. Sent for CT as above done after admission.   I personally performed the services described in this documentation, which was scribed in my presence. The recorded information has been reviewed and is accurate.        David B. Bernette Mayers, MD 03/07/14 2326

## 2014-03-07 NOTE — ED Notes (Signed)
Pt c/o abd pain since last night with nausea and diarrhea.  Reports history of crohn's disease.

## 2014-03-08 ENCOUNTER — Inpatient Hospital Stay (HOSPITAL_COMMUNITY): Payer: Medicare Other

## 2014-03-08 ENCOUNTER — Encounter (HOSPITAL_COMMUNITY): Payer: Self-pay | Admitting: Radiology

## 2014-03-08 DIAGNOSIS — R609 Edema, unspecified: Secondary | ICD-10-CM

## 2014-03-08 DIAGNOSIS — K3184 Gastroparesis: Secondary | ICD-10-CM

## 2014-03-08 DIAGNOSIS — R109 Unspecified abdominal pain: Secondary | ICD-10-CM

## 2014-03-08 DIAGNOSIS — K7460 Unspecified cirrhosis of liver: Secondary | ICD-10-CM

## 2014-03-08 DIAGNOSIS — K219 Gastro-esophageal reflux disease without esophagitis: Secondary | ICD-10-CM

## 2014-03-08 DIAGNOSIS — J189 Pneumonia, unspecified organism: Secondary | ICD-10-CM | POA: Diagnosis present

## 2014-03-08 DIAGNOSIS — D509 Iron deficiency anemia, unspecified: Secondary | ICD-10-CM

## 2014-03-08 DIAGNOSIS — R3129 Other microscopic hematuria: Secondary | ICD-10-CM | POA: Diagnosis present

## 2014-03-08 DIAGNOSIS — K5080 Crohn's disease of both small and large intestine without complications: Secondary | ICD-10-CM

## 2014-03-08 LAB — COMPREHENSIVE METABOLIC PANEL
ALBUMIN: 2.6 g/dL — AB (ref 3.5–5.2)
ALK PHOS: 186 U/L — AB (ref 39–117)
ALT: 41 U/L (ref 0–53)
AST: 39 U/L — ABNORMAL HIGH (ref 0–37)
BUN: 11 mg/dL (ref 6–23)
CO2: 29 mEq/L (ref 19–32)
Calcium: 8.2 mg/dL — ABNORMAL LOW (ref 8.4–10.5)
Chloride: 102 mEq/L (ref 96–112)
Creatinine, Ser: 0.67 mg/dL (ref 0.50–1.35)
GFR calc Af Amer: >90 mL/min (ref >90)
GFR calc non Af Amer: >90 mL/min (ref >90)
GLUCOSE: 142 mg/dL — AB (ref 70–99)
Potassium: 4.4 mEq/L (ref 3.7–5.3)
SODIUM: 139 meq/L (ref 137–147)
TOTAL PROTEIN: 5.7 g/dL — AB (ref 6.0–8.3)
Total Bilirubin: 0.7 mg/dL (ref 0.3–1.2)

## 2014-03-08 LAB — CBC
HEMATOCRIT: 31.4 % — AB (ref 39.0–52.0)
Hemoglobin: 10.1 g/dL — ABNORMAL LOW (ref 13.0–17.0)
MCH: 24.8 pg — AB (ref 26.0–34.0)
MCHC: 32.2 g/dL (ref 30.0–36.0)
MCV: 77.1 fL — AB (ref 78.0–100.0)
Platelets: 133 10*3/uL — ABNORMAL LOW (ref 150–400)
RBC: 4.07 MIL/uL — ABNORMAL LOW (ref 4.22–5.81)
RDW: 17.8 % — AB (ref 11.5–15.5)
WBC: 6.4 10*3/uL (ref 4.0–10.5)

## 2014-03-08 MED ORDER — DEXTROSE 5 % IV SOLN
INTRAVENOUS | Status: AC
  Filled 2014-03-08: qty 500

## 2014-03-08 MED ORDER — IOHEXOL 300 MG/ML  SOLN
80.0000 mL | Freq: Once | INTRAMUSCULAR | Status: AC | PRN
  Administered 2014-03-08: 80 mL via INTRAVENOUS

## 2014-03-08 MED ORDER — SODIUM CHLORIDE 0.9 % IV SOLN
INTRAVENOUS | Status: DC
  Administered 2014-03-09: 14:00:00 via INTRAVENOUS

## 2014-03-08 MED ORDER — DEXTROSE 5 % IV SOLN
500.0000 mg | INTRAVENOUS | Status: DC
  Administered 2014-03-08: 500 mg via INTRAVENOUS
  Filled 2014-03-08 (×2): qty 500

## 2014-03-08 MED ORDER — SODIUM CHLORIDE 0.9 % IV SOLN
INTRAVENOUS | Status: DC
  Administered 2014-03-08: 13:00:00 via INTRAVENOUS

## 2014-03-08 MED ORDER — METHYLPREDNISOLONE SODIUM SUCC 125 MG IJ SOLR
60.0000 mg | Freq: Two times a day (BID) | INTRAMUSCULAR | Status: DC
  Administered 2014-03-08 – 2014-03-10 (×6): 60 mg via INTRAVENOUS
  Filled 2014-03-08 (×6): qty 2

## 2014-03-08 MED ORDER — LEVOFLOXACIN IN D5W 750 MG/150ML IV SOLN
750.0000 mg | INTRAVENOUS | Status: DC
  Administered 2014-03-08 – 2014-03-09 (×2): 750 mg via INTRAVENOUS
  Filled 2014-03-08 (×3): qty 150

## 2014-03-08 NOTE — Progress Notes (Addendum)
Subjective: Since I last evaluated the patient   Admitted thru the ED last night with abdominal pain.  He c/o epigastric pain radiating into his rt abdomen.  He says he has never hurt this bad (last night). There is no change in his pain. Rates 10/10 without pain medication. Hx of Crohn's disease. Diagnosed in 2000. Seen in office this week by Dr. Karilyn Cotaehman and started on Prednisone taper starting at 30mg  daily and reducing by 5mg  weekly. [facility]. There is no SOB. He is scheduled for an Echocardiogram because of fluid overload. He had a temp last night of 100. Rectal temp in the ED 99.9. No weight gain since his office visit. Nausea, no vomiting. Last BM was yesterday around lunch which was normal for him: loose. No chest pain He passed flatus last night. Objective: Vital signs in last 24 hours: Temp:  [98.5 F (36.9 C)-99.9 F (37.7 C)] 98.5 F (36.9 C) (03/26 0542) Pulse Rate:  [82-98] 96 (03/26 0542) Resp:  [16-20] 18 (03/26 0542) BP: (112-132)/(72-79) 112/72 mmHg (03/26 0542) SpO2:  [98 %-100 %] 98 % (03/26 0542) Weight:  [229 lb 4.5 oz (104 kg)-230 lb (104.327 kg)] 229 lb 4.5 oz (104 kg) (03/26 0019) Last BM Date: 03/07/14  Intake/Output from previous day: 03/25 0701 - 03/26 0700 In: -  Out: 960 [Urine:960] Intake/Output this shift:    General appearance: alert Alert and oriented. Skin warm and dry. Oral mucosa is moist.   . Sclera anicteric, conjunctivae is pink. Thyroid not enlarged. No cervical lymphadenopathy. Lungs clear. Heart regular rate and rhythm.  . Bowel sounds are positive but seem sluggish. Abdomen is distended. He has tenderness.  No edema to lower extremities.    Lab Results:  Recent Labs  03/07/14 1905 03/08/14 0453  WBC 8.8 6.4  HGB 10.9* 10.1*  HCT 35.2* 31.4*  PLT 146* 133*   BMET  Recent Labs  03/07/14 1905 03/08/14 0453  NA 140 139  K 3.3* 4.4  CL 100 102  CO2 31 29  GLUCOSE 132* 142*  BUN 13 11  CREATININE 0.68  0.67  CALCIUM 8.6 8.2*   LFT  Recent Labs  03/08/14 0453  PROT 5.7*  ALBUMIN 2.6*  AST 39*  ALT 41  ALKPHOS 186*  BILITOT 0.7   PT/INR  Recent Labs  03/07/14 1905  LABPROT 13.4  INR 1.04   Hepatitis Panel No results found for this basename: HEPBSAG, HCVAB, HEPAIGM, HEPBIGM,  in the last 72 hours C-Diff No results found for this basename: CDIFFTOX,  in the last 72 hours Fecal Lactopherrin No results found for this basename: FECLLACTOFRN,  in the last 72 hours  Studies/Results: Ct Abdomen Pelvis W Contrast  03/08/2014   ADDENDUM REPORT: 03/08/2014 08:15  ADDENDUM: There was a typographical error in the initial report. The #1 in the Impression: Should read Large and small bowel.   Electronically Signed   By: Salome HolmesHector  Cooper M.D.   On: 03/08/2014 08:15   03/08/2014   CLINICAL DATA:  Abdominal pain.  EXAM: CT ABDOMEN AND PELVIS WITH CONTRAST  TECHNIQUE: Multidetector CT imaging of the abdomen and pelvis was performed using the standard protocol following bolus administration of intravenous contrast.  CONTRAST:  100mL OMNIPAQUE IOHEXOL 300 MG/ML SOLN, 50mL OMNIPAQUE IOHEXOL 300 MG/ML SOLN  COMPARISON:  US ABDOMEN LIMITED dated 01/26/2014; CT ABD/PELV WO CM dated 06/13/2013  FINDINGS: Poorly defined area of increased density with interstitial and airspace components in the posterior lateral right lung  base. A 1.5 cm sub solid focal density is appreciated posteriorly right middle lobe image 2 series 3.  The liver demonstrates a fine micronodular border. No focal abnormalities appreciated. Spleen is enlarged stable when compared to previous study. Diffuse mild ascites is appreciated within the abdomen and pelvis. There is diffuse edema within the intraperitoneal fat. Diffuse concentric bowel wall thickening appreciated involving the ascending, transverse, and portions of the proximal and mid descending colon. Moderate fecal retention is appreciated within the descending colon.  Multiple dilated  loops of small bowel are appreciated within the left upper quadrant in appear to primarily involve the jejunum. The ileum demonstrates a more normal caliber. There is diffuse concentric wall thickening involving the distal and terminal portions of the ileum.  No drainable loculated fluid collections are appreciated. There is anasarca within the abdominal wall.  Stable cysts is appreciated within the right kidney. Small bilateral extrarenal pelvises are appreciated. A nonobstructing 2 mm calculus midpole right kidney. Kidneys otherwise unremarkable. The pancreas and adrenals are unremarkable.  There is no evidence of abdominal aortic aneurysm. A small hiatal hernia is identified. The celiac, SMA, IMA, portal vein, SMV are opacified.  There is no evidence of abdominal or pelvic masses nor pathologic sized adenopathy.  IMPRESSION: 1. Large small bowel findings consistent with diffuse active Crohn's disease. Reactive ileus versus a partial or early small bowel obstruction primarily involving the jejunum. 2. Findings within the right middle lobe and right lung base likely reflecting interstitial pneumonitis. Further evaluation with dedicated chest CT is recommended considering the nodular appearance of the right middle lobe finding. 3. Changes of anasarca within the abdominal wall and inflammatory changes within the abdominal pelvic fat. Mild amount of ascites. 4. Stable right renal cyst  Electronically Signed: By: Salome Holmes M.D. On: 03/07/2014 23:12    Medications: I have reviewed the patient's current medications.  Assessment/Plan: Assessment:Plan #1.  #2.Active Crohn's disease. Agree with IV Solumedrol every 12 hrs.  #3. 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 CT shows mild amt of ascites. Agree with IV Rocephin #4  #5. History of pericarditis. Echo scheduled for tomorrow.        LOS: 1 day       [name],[name] 03/08/2014, 8:22 AM  GI attending note; Patient interviewed and  examined and abdominal pelvic CT reviewed with Dr. Ulyses Southward. I just saw patient in the office 2 days ago. CT suggests a lot of food debris in the stomach and I wonder if his gastric pain nausea and vomiting is secondary to pyloric stenosis or gastroparesis. Proximal small bowel is somewhat dilated but contrast passes distally he also is thickening to colonic wall. He also has abdominal wall edema which is most likely related to cirrhosis. He remains with distended abdomen with tenderness in epigastrium periumbilical region as well as right upper and right lower quadrant. Liver is firm and tender. Spleen is not palpable. Lower extremity edema has resolved since he was last seen 2 days ago. Patient's last EGD was on 10/23/2013 C. revealing grade 1 esophageal varices, probably gastric varices, portal gastropathy and gastric deformity in distal body and patent surgical anastomosis. Duodenum was normal. Recommendations; Continue clear liquids for now. Diagnostic EGD in a.m.

## 2014-03-08 NOTE — Progress Notes (Addendum)
Pt seen and examined, agree with note per Toya SmothersKaren Black NP Crohn's flare, continue IV solumedrol, add IVF, start clears, GI following Abnormal lung findings on CT abd, agree with Ct chest, prior h/o pneumonia -Doubt SBP minimal ascites on CT, keep on Abx pending CT chest  Zannie CovePreetha Jerimey Burridge, MD [phone]

## 2014-03-08 NOTE — Progress Notes (Signed)
UR chart review completed.  

## 2014-03-08 NOTE — Care Management Note (Addendum)
    Page 1 of 1   03/09/2014     11:54:07 AM   CARE MANAGEMENT NOTE 03/09/2014  Patient:  CORDE, MOURER A   Account Number:  [id number]  Date Initiated:  03/08/2014  Documentation initiated by:  Sharrie Rothman  Subjective/Objective Assessment:   Pt admitted from home with possible SBP and Chrons flare.. Pt lives with his wife and will return home. Pt is independent with ADL's.     Action/Plan:   No CM needs noted.   Anticipated DC Date:  03/12/2014   Anticipated DC Plan:  HOME/SELF CARE      DC Planning Services  CM consult      Choice offered to / List presented to:             Status of service:  Completed, signed off Medicare Important Message given?  YES (If response is "NO", the following Medicare IM given date fields will be blank) Date Medicare IM given:  03/09/2014 Date Additional Medicare IM given:    Discharge Disposition:  HOME/SELF CARE  Per UR Regulation:    If discussed at Long Length of Stay Meetings, dates discussed:    Comments:  03/08/14 1350 Arlyss Queen, RN BSN CM

## 2014-03-08 NOTE — Progress Notes (Signed)
[facility] PROGRESS NOTE  David [name] ZOX:[id number] DOB: 05/05/1976 DOA: 03/07/2014 PCP: [name], MD  Assessment/Plan: Plan:  #1 abdominal pain with nausea, vomiting, and low-grade fever: He was given a dose of ceftriaxone in the emergency department. This was continued and azithromycin added.  CT scan of his abdomen and pelvis reveal large and small bowel findings consistent with diffuse active Crohn's disease. Reactive ileus versus a partial or early small bowel obstruction primarily involving the jejunum. Findings within the right middle lobe and right lung base likely reflecting interstitial pneumonitis. Changes of anasarca within the abdominal wall and inflammatory changes within the abdominal pelvic fat. Mild amount of ascites. stable right renal cyst. Will obtain CT chest and depending on results will narrow antibiotics.  Await  gastroenterology. Urinalysis without infection. Pt feeling better this am. Will add clear liquids.   #2 history of Crohn's disease with fistula's in the past: His last abdominal surgery was in 2010 at [facility]. He has had some bowel resection as well back in 2004. Continue with his current dose of prednisone. Appreciated GI assistance  #3 history of liver cirrhosis secondary to methotrexate: Mild ascites per CT.  He has significantly distended abdomen but states not too far from baseline. LE edema improved.  Venous Doppler negative for DVT. Will discontinue IV Lasix for now. Continue with spironolactone.  #4 microcytic anemia: Hemoglobin appears to be close to baseline. Microscopic hematuria noted.  Continue to monitor. Likely need OP follow up. #5. Hematuria: microscopic. Hg stable. OP follow up. #6. Pneumonitis: per CT of abdomen. Patient states treated for pneumonia 2 months ago. Also states he "keeps a low grade temp". Will obtain CT chest. Patient is smoker.  Code Status: full Family Communication: none present Disposition Plan: home when  ready   Consultants:  Dr. Karilyn Cota  Procedures:    Antibiotics:  Rocephin 03/07/14>>  Azithromycin 03/07/14>>  HPI/Subjective: Ambulating in room. Gait steady. Reports feeling better. No nausea  Objective: Filed Vitals:   03/08/14 0542  BP: 112/72  Pulse: 96  Temp: 98.5 F (36.9 C)  Resp: 18    Intake/Output Summary (Last 24 hours) at 03/08/14 1236 Last data filed at 03/08/14 0143  Gross per 24 hour  Intake      0 ml  Output    960 ml  Net   -960 ml   Filed Weights   03/07/14 1842 03/08/14 0019  Weight: 104.327 kg (230 lb) 104 kg (229 lb 4.5 oz)    Exam:   General:  Well nourished appears comfortable  Cardiovascular: RRR No m/g/r trace LE edema  Respiratory: normal effort BS clear bilaterally no wheeze no rhonchi  Abdomen: severely distended and tight. +BS mild tenderness particularly on right. No guarding. Healed scars  Musculoskeletal: no clubbing or cyanosis   Data Reviewed: Basic Metabolic Panel:  Recent Labs Lab 03/07/14 1905 03/08/14 0453  NA 140 139  K 3.3* 4.4  CL 100 102  CO2 31 29  GLUCOSE 132* 142*  BUN 13 11  CREATININE 0.68 0.67  CALCIUM 8.6 8.2*   Liver Function Tests:  Recent Labs Lab 03/07/14 1905 03/08/14 0453  AST 49* 39*  ALT 48 41  ALKPHOS 197* 186*  BILITOT 0.5 0.7  PROT 6.1 5.7*  ALBUMIN 2.8* 2.6*   No results found for this basename: LIPASE, AMYLASE,  in the last 168 hours No results found for this basename: AMMONIA,  in the last 168 hours CBC:  Recent Labs Lab 03/07/14 1905 03/08/14 0453  WBC 8.8 6.4  NEUTROABS 6.5  --   HGB 10.9* 10.1*  HCT 35.2* 31.4*  MCV 77.9* 77.1*  PLT 146* 133*   Cardiac Enzymes: No results found for this basename: CKTOTAL, CKMB, CKMBINDEX, TROPONINI,  in the last 168 hours BNP (last 3 results) No results found for this basename: PROBNP,  in the last 8760 hours CBG: No results found for this basename: GLUCAP,  in the last 168 hours  No results found for this or any  previous visit (from the past 240 hour(s)).   Studies: Ct Abdomen Pelvis W Contrast  03/08/2014   ADDENDUM REPORT: 03/08/2014 08:15  ADDENDUM: There was a typographical error in the initial report. The #1 in the Impression: Should read Large and small bowel.   Electronically Signed   By: Salome HolmesHector  Cooper M.D.   On: 03/08/2014 08:15   03/08/2014   CLINICAL DATA:  Abdominal pain.  EXAM: CT ABDOMEN AND PELVIS WITH CONTRAST  TECHNIQUE: Multidetector CT imaging of the abdomen and pelvis was performed using the standard protocol following bolus administration of intravenous contrast.  CONTRAST:  100mL OMNIPAQUE IOHEXOL 300 MG/ML SOLN, 50mL OMNIPAQUE IOHEXOL 300 MG/ML SOLN  COMPARISON:  US ABDOMEN LIMITED dated 01/26/2014; CT ABD/PELV WO CM dated 06/13/2013  FINDINGS: Poorly defined area of increased density with interstitial and airspace components in the posterior lateral right lung base. A 1.5 cm sub solid focal density is appreciated posteriorly right middle lobe image 2 series 3.  The liver demonstrates a fine micronodular border. No focal abnormalities appreciated. Spleen is enlarged stable when compared to previous study. Diffuse mild ascites is appreciated within the abdomen and pelvis. There is diffuse edema within the intraperitoneal fat. Diffuse concentric bowel wall thickening appreciated involving the ascending, transverse, and portions of the proximal and mid descending colon. Moderate fecal retention is appreciated within the descending colon.  Multiple dilated loops of small bowel are appreciated within the left upper quadrant in appear to primarily involve the jejunum. The ileum demonstrates a more normal caliber. There is diffuse concentric wall thickening involving the distal and terminal portions of the ileum.  No drainable loculated fluid collections are appreciated. There is anasarca within the abdominal wall.  Stable cysts is appreciated within the right kidney. Small bilateral extrarenal pelvises  are appreciated. A nonobstructing 2 mm calculus midpole right kidney. Kidneys otherwise unremarkable. The pancreas and adrenals are unremarkable.  There is no evidence of abdominal aortic aneurysm. A small hiatal hernia is identified. The celiac, SMA, IMA, portal vein, SMV are opacified.  There is no evidence of abdominal or pelvic masses nor pathologic sized adenopathy.  IMPRESSION: 1. Large small bowel findings consistent with diffuse active Crohn's disease. Reactive ileus versus a partial or early small bowel obstruction primarily involving the jejunum. 2. Findings within the right middle lobe and right lung base likely reflecting interstitial pneumonitis. Further evaluation with dedicated chest CT is recommended considering the nodular appearance of the right middle lobe finding. 3. Changes of anasarca within the abdominal wall and inflammatory changes within the abdominal pelvic fat. Mild amount of ascites. 4. Stable right renal cyst  Electronically Signed: By: Salome HolmesHector  Cooper M.D. On: 03/07/2014 23:12    Scheduled Meds: . azithromycin  500 mg Intravenous Q24H  . carvedilol  6.25 mg Oral BID  . cefTRIAXone (ROCEPHIN)  IV  1 g Intravenous Q24H  . heparin  5,000 Units Subcutaneous 3 times per day  . methylPREDNISolone (SOLU-MEDROL) injection  60 mg Intravenous Q12H  . OxyCODONE  10 mg Oral QHS  . pantoprazole  40 mg Oral Daily  . potassium chloride  40 mEq Oral Daily  . sodium chloride  3 mL Intravenous Q12H  . spironolactone  50 mg Oral Daily  . ursodiol  600 mg Oral BID   Continuous Infusions: . 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 If 7PM-7AM, please contact night-coverage at [email or web address], password [facility] 03/08/2014, 12:36 PM  LOS:  1 day

## 2014-03-09 ENCOUNTER — Encounter (HOSPITAL_COMMUNITY)
Admission: EM | Disposition: A | Discharge status: Home / Self Care | Payer: Self-pay | Source: Home / Self Care | Attending: Internal Medicine

## 2014-03-09 ENCOUNTER — Encounter (HOSPITAL_COMMUNITY): Payer: Self-pay | Admitting: *Deleted

## 2014-03-09 ENCOUNTER — Inpatient Hospital Stay (HOSPITAL_COMMUNITY): Admission: RE | Admit: 2014-03-09 | Payer: Medicare Other | Source: Ambulatory Visit

## 2014-03-09 DIAGNOSIS — K3184 Gastroparesis: Secondary | ICD-10-CM

## 2014-03-09 DIAGNOSIS — R739 Hyperglycemia, unspecified: Secondary | ICD-10-CM | POA: Diagnosis present

## 2014-03-09 DIAGNOSIS — K5080 Crohn's disease of both small and large intestine without complications: Secondary | ICD-10-CM

## 2014-03-09 DIAGNOSIS — I369 Nonrheumatic tricuspid valve disorder, unspecified: Secondary | ICD-10-CM

## 2014-03-09 DIAGNOSIS — K766 Portal hypertension: Secondary | ICD-10-CM

## 2014-03-09 DIAGNOSIS — R112 Nausea with vomiting, unspecified: Secondary | ICD-10-CM

## 2014-03-09 DIAGNOSIS — R109 Unspecified abdominal pain: Secondary | ICD-10-CM

## 2014-03-09 DIAGNOSIS — K2100 Gastro-esophageal reflux disease with esophagitis, without bleeding: Secondary | ICD-10-CM

## 2014-03-09 DIAGNOSIS — J189 Pneumonia, unspecified organism: Secondary | ICD-10-CM

## 2014-03-09 HISTORY — PX: ESOPHAGOGASTRODUODENOSCOPY: SHX5428

## 2014-03-09 LAB — BASIC METABOLIC PANEL
BUN: 12 mg/dL (ref 6–23)
CHLORIDE: 101 meq/L (ref 96–112)
CO2: 29 mEq/L (ref 19–32)
Calcium: 8.5 mg/dL (ref 8.4–10.5)
Creatinine, Ser: 0.75 mg/dL (ref 0.50–1.35)
GFR calc Af Amer: >90 mL/min (ref >90)
GLUCOSE: 186 mg/dL — AB (ref 70–99)
Potassium: 4.5 mEq/L (ref 3.7–5.3)
Sodium: 139 mEq/L (ref 137–147)

## 2014-03-09 LAB — CBC
HEMATOCRIT: 28.5 % — AB (ref 39.0–52.0)
Hemoglobin: 9.1 g/dL — ABNORMAL LOW (ref 13.0–17.0)
MCH: 24.8 pg — ABNORMAL LOW (ref 26.0–34.0)
MCHC: 31.9 g/dL (ref 30.0–36.0)
MCV: 77.7 fL — AB (ref 78.0–100.0)
Platelets: 93 10*3/uL — ABNORMAL LOW (ref 150–400)
RBC: 3.67 MIL/uL — ABNORMAL LOW (ref 4.22–5.81)
RDW: 17.4 % — ABNORMAL HIGH (ref 11.5–15.5)
WBC: 5.5 10*3/uL (ref 4.0–10.5)

## 2014-03-09 LAB — GLUCOSE, CAPILLARY
Glucose-Capillary: 106 mg/dL — ABNORMAL HIGH (ref 70–99)
Glucose-Capillary: 167 mg/dL — ABNORMAL HIGH (ref 70–99)

## 2014-03-09 SURGERY — EGD (ESOPHAGOGASTRODUODENOSCOPY)
Anesthesia: Moderate Sedation

## 2014-03-09 MED ORDER — INSULIN ASPART 100 UNIT/ML ~~LOC~~ SOLN
0.0000 [IU] | Freq: Three times a day (TID) | SUBCUTANEOUS | Status: DC
  Administered 2014-03-10: 3 [IU] via SUBCUTANEOUS
  Administered 2014-03-10: 2 [IU] via SUBCUTANEOUS

## 2014-03-09 MED ORDER — MEPERIDINE HCL 50 MG/ML IJ SOLN
INTRAMUSCULAR | Status: AC
  Filled 2014-03-09: qty 1

## 2014-03-09 MED ORDER — STERILE WATER FOR IRRIGATION IR SOLN
Status: DC | PRN
  Administered 2014-03-09: 15:00:00

## 2014-03-09 MED ORDER — MIDAZOLAM HCL 5 MG/5ML IJ SOLN
INTRAMUSCULAR | Status: AC
Start: 2014-03-09 — End: 2014-03-10
  Filled 2014-03-09: qty 5

## 2014-03-09 MED ORDER — INSULIN ASPART 100 UNIT/ML ~~LOC~~ SOLN: 0.0000 [IU] | Freq: Every day | SUBCUTANEOUS | Status: DC

## 2014-03-09 MED ORDER — MIDAZOLAM HCL 5 MG/5ML IJ SOLN
INTRAMUSCULAR | Status: DC | PRN
  Administered 2014-03-09 (×3): 2 mg via INTRAVENOUS
  Administered 2014-03-09 (×3): 3 mg via INTRAVENOUS

## 2014-03-09 MED ORDER — PANTOPRAZOLE SODIUM 40 MG PO TBEC
40.0000 mg | DELAYED_RELEASE_TABLET | Freq: Two times a day (BID) | ORAL | Status: DC
  Administered 2014-03-09 – 2014-03-10 (×2): 40 mg via ORAL
  Filled 2014-03-09 (×2): qty 1

## 2014-03-09 MED ORDER — METOCLOPRAMIDE HCL 5 MG/ML IJ SOLN
10.0000 mg | Freq: Four times a day (QID) | INTRAMUSCULAR | Status: DC
  Administered 2014-03-09 – 2014-03-10 (×4): 10 mg via INTRAVENOUS
  Filled 2014-03-09 (×4): qty 2

## 2014-03-09 MED ORDER — MIDAZOLAM HCL 5 MG/5ML IJ SOLN
INTRAMUSCULAR | Status: AC
Start: 2014-03-09 — End: 2014-03-10
  Filled 2014-03-09: qty 10

## 2014-03-09 MED ORDER — MEPERIDINE HCL 50 MG/ML IJ SOLN
INTRAMUSCULAR | Status: DC | PRN
  Administered 2014-03-09 (×2): 25 mg via INTRAVENOUS

## 2014-03-09 NOTE — Progress Notes (Signed)
Pt seen and examined, agree with note per Toya Smothers NP Crohn's flare, concern for early PSBO ? Developing stricture Continue IV steroids, clears, EGD today, GI following CT chest with infectious vs inflamm changes, will treat with course of abx and need repeat CT in 2 months  Zannie Cove, MD [phone]

## 2014-03-09 NOTE — Progress Notes (Signed)
*  PRELIMINARY RESULTS* Echocardiogram 2D Echocardiogram has been performed.  David [name] 03/09/2014, 4:33 PM

## 2014-03-09 NOTE — Progress Notes (Signed)
[facility] PROGRESS NOTE  David [name] ZOX:[id number] DOB: [birth year] DOA: 03/07/2014 PCP: [name], MD  Assessment/Plan: #1 abdominal pain with nausea, vomiting, and low-grade fever: improved this am. CT scan of his abdomen and pelvis reveal large and small bowel findings consistent with diffuse active Crohn's disease. Reactive ileus versus a partial or early small bowel obstruction primarily involving the jejunum. Changes of anasarca within the abdominal wall and inflammatory changes within the abdominal pelvic fat. Mild amount of ascites. stable right renal cyst. Doubt SBP. Antibiotics changed to levaquin day #2 due to likely pneumonia per CT chest. Appreciate gastroenterology input. Scheduled EGD to day.  Tolerating clear liquids.   #2 history of Crohn's disease with fistula's in the past: His last abdominal surgery was in 2010 at [facility]. He has had some bowel resection as well back in 2004. Continue with his current dose of prednisone. 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 He has significantly distended abdomen but states not too far from baseline. LE edema resolved. Venous Doppler negative for DVT. Continue with spironolactone.   #4 microcytic anemia: Hemoglobin appears to be close to baseline. Microscopic hematuria noted. Continue to monitor. Likely need OP follow up.   #5. Hematuria: microscopic. Hg stable. OP follow up.   #6. Pneumonitis: per CT of abdomen. Patient states treated for pneumonia 2 months ago. Also states he "keeps a low grade temp". Will obtain CT chest. Patient is smoker.  #7. Pneumonia: HCAP. Recently at [facility] for same. Chest CT reveals multifocal patchy airspace opacity in the right lung, likely infectious/inflammatory. There is associated volume loss and atelectasis and some of the areas. Tiny right pleural effusion. Consider a course of empiric antibiotic therapy with repeat chest CT in  6-8 weeks. Levaquin day #2. Afebrile and non-toxic appearing  #8. Hyperglycemia: steroid induced. Will use SSI for control.    Code Status: full Family Communication: none present Disposition Plan: home when ready   Consultants:  Dr Karilyn Cota [facility]  Procedures:  EGD 03/09/14  Antibiotics: Rocephin 03/07/14>> 03/08/14 Azithromycin 03/07/14>>03/08/14 Levaquin 03/08/14>>  HPI/Subjective: Ambulating in room. Gait steady reports improved pain no nausea  Objective: Filed Vitals:   03/09/14 0500  BP: 110/63  Pulse: 69  Temp: 97.9 F (36.6 C)  Resp: 14    Intake/Output Summary (Last 24 hours) at 03/09/14 1052 Last data filed at 03/09/14 0600  Gross per 24 hour  Intake   1205 ml  Output   3100 ml  Net  -1895 ml   Filed Weights   03/07/14 1842 03/08/14 0019  Weight: 104.327 kg (230 lb) 104 kg (229 lb 4.5 oz)    Exam:   General:  Calm appears comfortable  Cardiovascular: RRR No MGR no LE edema  Respiratory: normal effort BS clear bilaterally no wheeze  Abdomen: very distended and diffusely tender to palpation  Musculoskeletal: no clubbing or cyanosis   Data Reviewed: Basic Metabolic Panel:  Recent Labs Lab 03/07/14 1905 03/08/14 0453 03/09/14 0459  NA 140 139 139  K 3.3* 4.4 4.5  CL 100 102 101  CO2 31 29 29   GLUCOSE 132* 142* 186*  BUN 13 11 12   CREATININE 0.68 0.67 0.75  CALCIUM 8.6 8.2* 8.5   Liver Function Tests:  Recent Labs Lab 03/07/14 1905 03/08/14 0453  AST 49* 39*  ALT 48 41  ALKPHOS 197* 186*  BILITOT 0.5 0.7  PROT 6.1 5.7*  ALBUMIN 2.8* 2.6*   No results found  for this basename: LIPASE, AMYLASE,  in the last 168 hours No results found for this basename: AMMONIA,  in the last 168 hours CBC:  Recent Labs Lab 03/07/14 1905 03/08/14 0453 03/09/14 0459  WBC 8.8 6.4 5.5  NEUTROABS 6.5  --   --   HGB 10.9* 10.1* 9.1*  HCT 35.2* 31.4* 28.5*  MCV 77.9* 77.1* 77.7*  PLT 146* 133* 93*   Cardiac Enzymes: No results found for this  basename: CKTOTAL, CKMB, CKMBINDEX, TROPONINI,  in the last 168 hours BNP (last 3 results) No results found for this basename: PROBNP,  in the last 8760 hours CBG: No results found for this basename: GLUCAP,  in the last 168 hours  No results found for this or any previous visit (from the past 240 hour(s)).   Studies: Ct Chest W Contrast  03/08/2014   CLINICAL DATA:  Pneumonitis. Cigarette smoker. Evaluate for thoracic mass.  EXAM: CT CHEST WITH CONTRAST  TECHNIQUE: Multidetector CT imaging of the chest was performed during intravenous contrast administration.  CONTRAST:  80mL OMNIPAQUE IOHEXOL 300 MG/ML  SOLN  COMPARISON:  MR 3D RECON AT [facility] dated 05/03/2013; CT ABD - PELV W/ CM dated 03/07/2014; CT ANGIO CHEST W/CM &/OR WO/CM dated 10/29/2012  FINDINGS: In the right lung, there are areas of irregular airspace opacity in the upper, middle and right lower lobe. Some of these areas have volume loss and associated atelectasis. There is some distortion of the minor fissure adjacent to a nodular opacity in the right middle lobe. The largest discrete nodule is in the subpleural right middle lobe measuring 15 mm x 6 mm x 9 mm (image 25 series 3). Other areas are more ill-defined and irregular. The left lung is clear. The central airways are patent. There is no axillary adenopathy. Engorgement of the azygos vein is noted. Incidental imaging of the upper abdomen shows no interval change compared to recent abdominal CT with ascites and cirrhosis with splenomegaly. For vessel aortic arch. No acute aortic abnormality. Mild respiratory motion on the examination. Tiny left pleural fat sat tiny right pleural effusion. No pericardial effusion or left pleural effusion. No aggressive osseous lesions are present. Lower esophageal varices incidentally noted.  IMPRESSION: 1. Multifocal patchy airspace opacity in the right lung, likely infectious/inflammatory. There is associated volume loss and atelectasis and some of the  areas. Tiny right pleural effusion. Consider a course of empiric antibiotic therapy with repeat chest CT in 6-8 weeks. 2. No focal mass lesion or adenopathy is to suggest neoplasm.   Electronically Signed   By: Andreas Newport M.D.   On: 03/08/2014 13:52   Ct Abdomen Pelvis W Contrast  03/08/2014   ADDENDUM REPORT: 03/08/2014 08:15  ADDENDUM: There was a typographical error in the initial report. The #1 in the Impression: Should read Large and small bowel.   Electronically Signed   By: Salome Holmes M.D.   On: 03/08/2014 08:15   03/08/2014   CLINICAL DATA:  Abdominal pain.  EXAM: CT ABDOMEN AND PELVIS WITH CONTRAST  TECHNIQUE: Multidetector CT imaging of the abdomen and pelvis was performed using the standard protocol following bolus administration of intravenous contrast.  CONTRAST:  OMNIPAQUE IOHEXOL 300 MG/ML SOLN, 50mL OMNIPAQUE IOHEXOL 300 MG/ML SOLN  COMPARISON:  US ABDOMEN LIMITED dated 01/26/2014; CT ABD/PELV WO CM dated 06/13/2013  FINDINGS: Poorly defined area of increased density with interstitial and airspace components in the posterior lateral right lung base. A 1.5 cm sub solid focal density is appreciated posteriorly  right middle lobe image 2 series 3.  The liver demonstrates a fine micronodular border. No focal abnormalities appreciated. Spleen is enlarged stable when compared to previous study. Diffuse mild ascites is appreciated within the abdomen and pelvis. There is diffuse edema within the intraperitoneal fat. Diffuse concentric bowel wall thickening appreciated involving the ascending, transverse, and portions of the proximal and mid descending colon. Moderate fecal retention is appreciated within the descending colon.  Multiple dilated loops of small bowel are appreciated within the left upper quadrant in appear to primarily involve the jejunum. The ileum demonstrates a more normal caliber. There is diffuse concentric wall thickening involving the distal and terminal portions of the  ileum.  No drainable loculated fluid collections are appreciated. There is anasarca within the abdominal wall.  Stable cysts is appreciated within the right kidney. Small bilateral extrarenal pelvises are appreciated. A nonobstructing 2 mm calculus midpole right kidney. Kidneys otherwise unremarkable. The pancreas and adrenals are unremarkable.  There is no evidence of abdominal aortic aneurysm. A small hiatal hernia is identified. The celiac, SMA, IMA, portal vein, SMV are opacified.  There is no evidence of abdominal or pelvic masses nor pathologic sized adenopathy.  IMPRESSION: 1. Large small bowel findings consistent with diffuse active Crohn's disease. Reactive ileus versus a partial or early small bowel obstruction primarily involving the jejunum. 2. Findings within the right middle lobe and right lung base likely reflecting interstitial pneumonitis. Further evaluation with dedicated chest CT is recommended considering the nodular appearance of the right middle lobe finding. 3. Changes of anasarca within the abdominal wall and inflammatory changes within the abdominal pelvic fat. Mild amount of ascites. 4. Stable right renal cyst  Electronically Signed: By: Salome HolmesHector  Cooper M.D. On: 03/07/2014 23:12   [place] Venous Img Lower Bilateral  03/08/2014   CLINICAL DATA:  Bilateral lower extremity swelling.  EXAM: Bilateral LOWER EXTREMITY VENOUS DOPPLER ULTRASOUND  TECHNIQUE: Gray-scale sonography with graded compression, as well as color Doppler and duplex ultrasound, were performed to evaluate the deep venous system from the level of the common femoral vein through the popliteal and proximal calf veins. Spectral Doppler was utilized to evaluate flow at rest and with distal augmentation maneuvers.  COMPARISON:  None.  FINDINGS: Thrombus within deep veins:  None visualized.  Compressibility of deep veins:  Normal.  Duplex waveform respiratory phasicity:  Normal.  Duplex waveform response to augmentation:  Normal.   Venous reflux:  None visualized.  Other findings: Compressible varices are seen in the medial portions of both calves. Edema is also noted in the subcutaneous tissues.  IMPRESSION: No evidence of deep venous thrombosis seen involving either lower extremity.   Electronically Signed   By: Roque LiasJames  Green M.D.   On: 03/08/2014 12:43    Scheduled Meds: . carvedilol  6.25 mg Oral BID  . insulin aspart  0-15 Units Subcutaneous TID WC  . insulin aspart  0-5 Units Subcutaneous QHS  . levofloxacin (LEVAQUIN) IV  750 mg Intravenous Q24H  . methylPREDNISolone (SOLU-MEDROL) injection  60 mg Intravenous Q12H  . OxyCODONE  10 mg Oral QHS  . pantoprazole  40 mg Oral Daily  . potassium chloride  40 mEq Oral Daily  . sodium chloride  3 mL Intravenous Q12H  . spironolactone  50 mg Oral Daily  . ursodiol  600 mg Oral BID   Continuous Infusions: . sodium chloride 50 mL/hr at 03/08/14 1254  . 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 If 7PM-7AM, please contact night-coverage at [email or web address], password [facility] 03/09/2014, 10:52 AM  LOS: 2 days

## 2014-03-09 NOTE — Op Note (Signed)
EGD PROCEDURE REPORT  PATIENT:  David [name]  MR#:  [id number] Birthdate:  August 07, 1976, 38 y.o., male Endoscopist:  Dr. Malissa HippoNajeeb U. Rehman, MD Referred By:  Dr. Zannie CovePreetha Joseph, MD Procedure Date: 03/09/2014  Procedure:   EGD  Indications:  Patient is 38 year old Caucasian male with complicated GI history who was Crohn's disease as well as cirrhosis who presents with nausea vomiting and worsening abdominal pain. CT reveals food debris in the stomach raising possibility of gastric outlet obstruction or gastroparesis.            Informed Consent:  The risks, benefits, alternatives & imponderables which include, but are not limited to, bleeding, infection, perforation, drug reaction and potential missed lesion have been reviewed.  The potential for biopsy, lesion removal, esophageal dilation, etc. have also been discussed.  Questions have been answered.  All parties agreeable.  Please see history & physical in medical record for more information.  Medications:  Demerol 50 mg IV Versed 15 mg IV Cetacaine spray topically for oropharyngeal anesthesia  Description of procedure:  The endoscope was introduced through the mouth and advanced to the second portion of the duodenum without difficulty or limitations. The mucosal surfaces were surveyed very carefully during advancement of the scope and upon withdrawal.  Findings:  Esophagus:  Mucosa of the proximal and middle segment was normal. Multiple erosions and linear ulcers noted involving distal 5 cm of esophageal mucosa. No ring or stricture noted the GE junction. GEJ:  39 cm Stomach:  Moderate amount of bile noted in the stomach along with food debris. Stomach distended very well with insufflation. There was edema and erythema  to gastric mucosa throughout with snake skinning and mosaic pattern. Noncritical narrowing noted at antrum with the appearance of pylorus(pseudopylorus). Angularis fundus and cardia examined by retroflex in the scope and were  normal. Gastrojejunal anastomosis could not be seen because of food debris in the stomach. Duodenum:  Normal bulbar and post bulbar mucosa. No stricture was identified in the second part of the duodenum.  Therapeutic/Diagnostic Maneuvers Performed:  None  Complications:  None  Impression: Erosive/ulcerative reflux esophagitis. Portal gastropathy. Moderate amount of bile and food debris in the stomach precluding visualization of gastrojejunal anastomosis.  Comment; Esophageal injury would appear to be alkaline mediated. He may have an element of gastroparesis is secondary to his medications.   Recommendations:  Low-salt low-fat diet. Pantoprazole 40 mg by mouth twice a day. Metoclopramide 10 mg IV every 6 hours. Resume DVT prophylaxis with Flowtron. DC dicyclomine on discharge.  REHMAN,NAJEEB U  03/09/2014  3:34 PM  CC: Dr. Harlow AsaLUKING,W S, MD & Dr. Bonnetta BarryNo ref. provider found

## 2014-03-10 DIAGNOSIS — K3184 Gastroparesis: Secondary | ICD-10-CM | POA: Diagnosis present

## 2014-03-10 LAB — GLUCOSE, CAPILLARY
GLUCOSE-CAPILLARY: 140 mg/dL — AB (ref 70–99)
Glucose-Capillary: 131 mg/dL — ABNORMAL HIGH (ref 70–99)

## 2014-03-10 MED ORDER — LEVOFLOXACIN 750 MG PO TABS
750.0000 mg | ORAL_TABLET | Freq: Every day | ORAL | Status: DC
  Administered 2014-03-10: 750 mg via ORAL
  Filled 2014-03-10 (×2): qty 1

## 2014-03-10 MED ORDER — LEVOFLOXACIN 500 MG PO TABS: 500.0000 mg | ORAL_TABLET | Freq: Every day | ORAL | Status: DC

## 2014-03-10 MED ORDER — METOCLOPRAMIDE HCL 5 MG PO TABS: 5.0000 mg | ORAL_TABLET | Freq: Three times a day (TID) | ORAL | Status: DC

## 2014-03-10 MED ORDER — OXYCODONE HCL 10 MG PO TABS: 10.0000 mg | ORAL_TABLET | Freq: Three times a day (TID) | ORAL | Status: DC | PRN

## 2014-03-10 MED ORDER — PANTOPRAZOLE SODIUM 40 MG PO TBEC: 40.0000 mg | DELAYED_RELEASE_TABLET | Freq: Two times a day (BID) | ORAL | Status: DC

## 2014-03-10 MED ORDER — OXYCODONE HCL 5 MG PO TABS: 10.0000 mg | ORAL_TABLET | ORAL | Status: DC | PRN

## 2014-03-10 NOTE — Progress Notes (Signed)
Discharge instructions and prescriptions provided to the patient at this time. Voices understanding of all information presented. Patient discharged home with mother.

## 2014-03-10 NOTE — Discharge Summary (Addendum)
Physician Discharge Summary  [name]:[id number] DOB: 02-19-1976 DOA: 03/07/2014  PCP: [name], MD  Admit date: 03/07/2014 Discharge date: 03/10/2014  Time spent: 45 minutes  Recommendations for Outpatient Follow-up:  1. Dr.Rehman in 1 week 2. Assess for side effects of Reglan 3. Wean Narcotics as tolerated 4. FU ECHO  Discharge Diagnoses:  Principal Problem:   Abdominal pain Active Problems:   Crohn's disease of both small and large intestine with fistula   GERD (gastroesophageal reflux disease)   Ascites   Liver cirrhosis   Hematuria, microscopic   Pneumonitis   HCAP (healthcare-associated pneumonia)   Hyperglycemia   Gastroparesis  Discharge Condition: improving Diet recommendation: bland diet  Filed Weights   03/07/14 1842 03/08/14 0019 03/09/14 1331  Weight: 104.327 kg (230 lb) 104 kg (229 lb 4.5 oz) 103.874 kg (229 lb)    History of present illness:  Chief Complaint: Abdominal pain with nausea and vomiting  HPI: David [name] is a 38 y.o. 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 He had 2 episodes of vomiting at that time without any blood. He felt bloated. However, the symptoms started resolving. He went to see his gastroenterologist yesterday and he was feeling better at that time. But then last night he started developing the pain again. The pain has been sharp, continuous, 10 out of 10 in intensity and all over the abdomen but more so on the right side. He has had nausea, but no further episodes of emesis. He has diarrhea on a chronic basis, which is unchanged. Denies any blood in the stool. He had temperature up to 23F. He has felt more distended in the abdomen and has noticed more swelling in his legs. No shortness of breath. No dizziness. After receiving pain medicines in the ED,  he is feeling slightly better.   Hospital Course:  He was admitted with Abd pain, felt to be due to crohn's flare. He was treated with IV steroids and supportive care On CT abd pelvis, noted to have findings with diffuse active crohn's disease. Seen by Dr.Rehman in consultation, was concerned for gastroparesis and hence underwent a EGD which showed -Erosive/ulcerative reflux esophagitis, Portal gastropathy, Moderate amount of bile and food debris in the stomach precluding visualization of gastrojejunal anastomosis.  Dr.Rehman felt that esophageal injury may be alkaline mediated.  He may have an element of gastroparesis is secondary to his medications.  Hence recommended, Low-salt low-fat diet, Pantoprazole 40 mg by mouth twice a day and started on Reglan. He was tolerating his diet by the time of discharge and back on PRednisone 30mg  per GI. Will need to be monitored for side effects of Reglan Advised regarding cutting down narcotics as tolerated and stopped dicyclomine on discharge.   Pneumonia vs pneumonitis -was noted on CT abd to have abnormal lung findings and hence underwent CT chest, was treated with a course of Abx in February in [place] for pneumonia. 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 -he also has chronic lower extremity edema,  dopplers negative, ECHO done report pending -continued on lasix and aldactone    Discharge Exam: Filed Vitals:   03/10/14 0413  BP: 117/75  Pulse: 61  Temp: 97.7 F (36.5 C)  Resp: 22    General: AAOx3 Cardiovascular: S1S2/RRR Respiratory: CTAB  Discharge  Instructions  Discharge Orders   Future Appointments Provider Department Dept Phone   06/18/2014 9:30 AM Malissa Hippo, MD [facility] [phone]   Future Orders Complete By Expires   Increase activity slowly  As directed        Medication List    STOP taking these medications       dicyclomine 10 MG capsule  Commonly known as:  BENTYL      TAKE these medications       ALIGN 4 MG Caps  Take 4 mg by mouth daily.     calcium carbonate 600 MG Tabs tablet  Commonly known as:  OS-CAL  Take 600 mg by mouth 2 (two) times daily with a meal.     carvedilol 6.25 MG tablet  Commonly known as:  COREG  Take 6.25 mg by mouth 2 (two) times daily.     ferrous sulfate 325 (65 FE) MG tablet  Take 325 mg by mouth 2 (two) times daily.     fish oil-omega-3 fatty acids 1000 MG capsule  Take 1 g by mouth 3 (three) times daily.     furosemide 40 MG tablet  Commonly known as:  LASIX  Take 80 mg by mouth daily.     levofloxacin 500 MG tablet  Commonly known as:  LEVAQUIN  Take 1 tablet (500 mg total) by mouth daily. For 5 days     metoCLOPramide 5 MG tablet  Commonly known as:  REGLAN  Take 1 tablet (5 mg total) by mouth 3 (three) times daily before meals.     metolazone 5 MG tablet  Commonly known as:  ZAROXOLYN  Take 1 tablet (5 mg total) by mouth every three (3) days as needed.     multivitamins ther. w/minerals Tabs tablet  Take 1 tablet by mouth 2 (two) times daily.     Oxycodone HCl 10 MG Tabs  Take 1 tablet (10 mg total) by mouth 3 (three) times daily as needed.     oxymorphone 10 MG 12 hr tablet  Commonly known as:  OPANA ER  Take 1 tablet (10 mg total) by mouth at bedtime.     pantoprazole 40 MG tablet  Commonly known as:  PROTONIX  Take 1 tablet (40 mg total) by mouth 2 (two) times daily.     potassium chloride 10 MEQ tablet  Commonly known as:  K-DUR,KLOR-CON  Take 20 mEq by mouth daily.     predniSONE 10 MG tablet  Commonly known as:   DELTASONE  Take 30 mg by mouth daily.     promethazine 25 MG tablet  Commonly known as:  PHENERGAN  Take 25 mg by mouth every 6 (six) hours as needed. nausea     spironolactone 50 MG tablet  Commonly known as:  ALDACTONE  Take 50 mg by mouth daily.     ursodiol 300 MG capsule  Commonly known as:  ACTIGALL  Take 600 mg by mouth 2 (two) times daily.       Allergies  Allergen Reactions  . Remicade [Infliximab]     Dr Karilyn Cota states previous respiratory arrest not related to remicade. Dr Karilyn Cota states remicade not a drug related allergy.  07-07-2013 at 1025 rapid response called to PACU- Patient difficulty breathing after infusion of Remicade infusing. Do NOT Give Remicade!  . Fentanyl And Related Itching    Swelling, Redness  . Wellbutrin [Bupropion] Nausea And Vomiting  . Morphine And Related Hives       Follow-up Information   Follow up with REHMAN,NAJEEB U, MD. Schedule an appointment as soon as possible for a visit in 1 week.   Specialty:  Gastroenterology   Contact information:   [address], [address] [place] [address] [phone]        The results of significant diagnostics from this hospitalization (including imaging, microbiology, ancillary and laboratory) are listed below for reference.    Significant Diagnostic Studies: Ct Chest W Contrast  03/08/2014   CLINICAL DATA:  Pneumonitis. Cigarette smoker. Evaluate for thoracic mass.  EXAM: CT CHEST WITH CONTRAST  TECHNIQUE: Multidetector CT imaging of the chest was performed during intravenous contrast administration.  CONTRAST:  80mL OMNIPAQUE IOHEXOL 300 MG/ML  SOLN  COMPARISON:  MR 3D RECON AT [facility] dated 05/03/2013; CT ABD - PELV W/ CM dated 03/07/2014; CT ANGIO CHEST W/CM &/OR WO/CM dated 10/29/2012  FINDINGS: In the right lung, there are areas of irregular airspace opacity in the upper, middle and right lower lobe. Some of these areas have volume loss and associated atelectasis. There is some distortion of the  minor fissure adjacent to a nodular opacity in the right middle lobe. The largest discrete nodule is in the subpleural right middle lobe measuring 15 mm x 6 mm x 9 mm (image 25 series 3). Other areas are more ill-defined and irregular. The left lung is clear. The central airways are patent. There is no axillary adenopathy. Engorgement of the azygos vein is noted. Incidental imaging of the upper abdomen shows no interval change compared to recent abdominal CT with ascites and cirrhosis with splenomegaly. For vessel aortic arch. No acute aortic abnormality. Mild respiratory motion on the examination. Tiny left pleural fat sat tiny right pleural effusion. No pericardial effusion or left pleural effusion. No aggressive osseous lesions are present. Lower esophageal varices incidentally noted.  IMPRESSION: 1. Multifocal patchy airspace opacity in the right lung, likely infectious/inflammatory. There is associated volume loss and atelectasis and some of the areas. Tiny right pleural effusion. Consider a course of empiric antibiotic therapy with repeat chest CT in 6-8 weeks. 2. No focal mass lesion or adenopathy is to suggest neoplasm.   Electronically Signed   By: Andreas Newport M.D.   On: 03/08/2014 13:52   Ct Abdomen Pelvis W Contrast  03/08/2014   ADDENDUM REPORT: 03/08/2014 08:15  ADDENDUM: There was a typographical error in the initial report. The #1 in the Impression: Should read Large and small bowel.   Electronically Signed   By: Salome Holmes M.D.   On: 03/08/2014 08:15   03/08/2014   CLINICAL DATA:  Abdominal pain.  EXAM: CT ABDOMEN AND PELVIS WITH CONTRAST  TECHNIQUE: Multidetector CT imaging of the abdomen and pelvis was performed using the standard protocol following bolus administration of intravenous contrast.  CONTRAST:  OMNIPAQUE IOHEXOL 300 MG/ML SOLN, 50mL OMNIPAQUE IOHEXOL 300 MG/ML SOLN  COMPARISON:  US ABDOMEN LIMITED dated 01/26/2014; CT ABD/PELV WO CM dated 06/13/2013  FINDINGS: Poorly  defined area of increased density with interstitial and airspace components in the posterior lateral right lung base. A 1.5 cm sub solid focal density is appreciated posteriorly right middle lobe image 2 series 3.  The liver demonstrates a  fine micronodular border. No focal abnormalities appreciated. Spleen is enlarged stable when compared to previous study. Diffuse mild ascites is appreciated within the abdomen and pelvis. There is diffuse edema within the intraperitoneal fat. Diffuse concentric bowel wall thickening appreciated involving the ascending, transverse, and portions of the proximal and mid descending colon. Moderate fecal retention is appreciated within the descending colon.  Multiple dilated loops of small bowel are appreciated within the left upper quadrant in appear to primarily involve the jejunum. The ileum demonstrates a more normal caliber. There is diffuse concentric wall thickening involving the distal and terminal portions of the ileum.  No drainable loculated fluid collections are appreciated. There is anasarca within the abdominal wall.  Stable cysts is appreciated within the right kidney. Small bilateral extrarenal pelvises are appreciated. A nonobstructing 2 mm calculus midpole right kidney. Kidneys otherwise unremarkable. The pancreas and adrenals are unremarkable.  There is no evidence of abdominal aortic aneurysm. A small hiatal hernia is identified. The celiac, SMA, IMA, portal vein, SMV are opacified.  There is no evidence of abdominal or pelvic masses nor pathologic sized adenopathy.  IMPRESSION: 1. Large small bowel findings consistent with diffuse active Crohn's disease. Reactive ileus versus a partial or early small bowel obstruction primarily involving the jejunum. 2. Findings within the right middle lobe and right lung base likely reflecting interstitial pneumonitis. Further evaluation with dedicated chest CT is recommended considering the nodular appearance of the right middle  lobe finding. 3. Changes of anasarca within the abdominal wall and inflammatory changes within the abdominal pelvic fat. Mild amount of ascites. 4. Stable right renal cyst  Electronically Signed: By: Salome Holmes M.D. On: 03/07/2014 23:12   US Venous Img Lower Bilateral  03/08/2014   CLINICAL DATA:  Bilateral lower extremity swelling.  EXAM: Bilateral LOWER EXTREMITY VENOUS DOPPLER ULTRASOUND  TECHNIQUE: Gray-scale sonography with graded compression, as well as color Doppler and duplex ultrasound, were performed to evaluate the deep venous system from the level of the common femoral vein through the popliteal and proximal calf veins. Spectral Doppler was utilized to evaluate flow at rest and with distal augmentation maneuvers.  COMPARISON:  None.  FINDINGS: Thrombus within deep veins:  None visualized.  Compressibility of deep veins:  Normal.  Duplex waveform respiratory phasicity:  Normal.  Duplex waveform response to augmentation:  Normal.  Venous reflux:  None visualized.  Other findings: Compressible varices are seen in the medial portions of both calves. Edema is also noted in the subcutaneous tissues.  IMPRESSION: No evidence of deep venous thrombosis seen involving either lower extremity.   Electronically Signed   By: Roque Lias M.D.   On: 03/08/2014 12:43    Microbiology: No results found for this or any previous visit (from the past 240 hour(s)).   Labs: Basic Metabolic Panel:  Recent Labs Lab 03/07/14 1905 03/08/14 0453 03/09/14 0459  NA 140 139 139  K 3.3* 4.4 4.5  CL 100 102 101  CO2 31 29 29   GLUCOSE 132* 142* 186*  BUN 13 11 12   CREATININE 0.68 0.67 0.75  CALCIUM 8.6 8.2* 8.5   Liver Function Tests:  Recent Labs Lab 03/07/14 1905 03/08/14 0453  AST 49* 39*  ALT 48 41  ALKPHOS 197* 186*  BILITOT 0.5 0.7  PROT 6.1 5.7*  ALBUMIN 2.8* 2.6*   No results found for this basename: LIPASE, AMYLASE,  in the last 168 hours No results found for this basename: AMMONIA,   in the last 168 hours CBC:  Recent Labs Lab 03/07/14 1905 03/08/14 0453 03/09/14 0459  WBC 8.8 6.4 5.5  NEUTROABS 6.5  --   --   HGB 10.9* 10.1* 9.1*  HCT 35.2* 31.4* 28.5*  MCV 77.9* 77.1* 77.7*  PLT 146* 133* 93*   Cardiac Enzymes: No results found for this basename: CKTOTAL, CKMB, CKMBINDEX, TROPONINI,  in the last 168 hours BNP: BNP (last 3 results) No results found for this basename: PROBNP,  in the last 8760 hours CBG:  Recent Labs Lab 03/09/14 1649 03/09/14 2104 03/10/14 0738 03/10/14 1116  GLUCAP 106* 167* 140* 131*       Signed:  Brighten Orndoff  [facility] 03/10/2014, 1:53 PM

## 2014-03-10 NOTE — Progress Notes (Signed)
I discussed the case with Dr. Karilyn Cota last evening. Patient feels well this morning. On metoclopramide without apparent side effects.  Vital signs in last 24 hours: Temp:  [97.7 F (36.5 C)-98.2 F (36.8 C)] 97.7 F (36.5 C) (03/28 0413) Pulse Rate:  [61-80] 61 (03/28 0413) Resp:  [9-22] 22 (03/28 0413) BP: (95-117)/(48-75) 117/75 mmHg (03/28 0413) SpO2:  [97 %-100 %] 97 % (03/28 0413) Weight:  [229 lb (103.874 kg)] 229 lb (103.874 kg) (03/27 1331) Last BM Date: 03/07/14 General:   Alert,  comfortable appearing pleasant and cooperative in NAD Abdomen:  Full. Positive bowel sounds. Soft, nontender without appreciable mass or hepatosplenomegaly. No succussion splash. Extremities:  Without clubbing or edema.    Intake/Output from previous day: 03/27 0701 - 03/28 0700 In: 2028.3 [P.O.:820; I.V.:1208.3] Out: 1950 [Urine:1950] Intake/Output this shift: Total I/O In: 240 [P.O.:240] Out: 400 [Urine:400]  Lab Results:  Recent Labs  03/07/14 1905 03/08/14 0453 03/09/14 0459  WBC 8.8 6.4 5.5  HGB 10.9* 10.1* 9.1*  HCT 35.2* 31.4* 28.5*  PLT 146* 133* 93*   BMET  Recent Labs  03/07/14 1905 03/08/14 0453 03/09/14 0459  NA 140 139 139  K 3.3* 4.4 4.5  CL 100 102 101  CO2 31 29 29   GLUCOSE 132* 142* 186*  BUN 13 11 12   CREATININE 0.68 0.67 0.75  CALCIUM 8.6 8.2* 8.5   LFT  Recent Labs  03/08/14 0453  PROT 5.7*  ALBUMIN 2.6*  AST 39*  ALT 41  ALKPHOS 186*  BILITOT 0.7   PT/INR  Recent Labs  03/07/14 1905  LABPROT 13.4  INR 1.04    Impression: Pleasant 38 year old gentleman with Crohn's disease complicated by cirrhosis and now gastroparesis with secondary ulcerative reflux esophagitis. Appears improved on current medical regimen.  Recommendations:  Per Dr. Tenna Child plan, patient should go home on prednisone 30 mg daily. He should also be discharged on PPI and Reglan-I discussed the risks of metoclopramide therapy. I told the patient not taking more than he  absolutely needs. Also, reviewed gastroparesis diet. Outpatient followup with Drs Karilyn Cota and  [facility].

## 2014-03-10 NOTE — Progress Notes (Signed)
PHARMACIST - PHYSICIAN COMMUNICATION DR:   Jomarie Longs CONCERNING: Antibiotic IV to Oral Route Change Policy  RECOMMENDATION: This patient is receiving Levaquin by the intravenous route.  Based on criteria approved by the Pharmacy and Therapeutics Committee, the antibiotic(s) is/are being converted to the equivalent oral dose form(s).  DESCRIPTION: These criteria include:  Patient being treated for a respiratory tract infection, urinary tract infection, cellulitis or clostridium difficile associated diarrhea if on metronidazole  The patient is not neutropenic and does not exhibit a GI malabsorption state  The patient is eating (either orally or via tube) and/or has been taking other orally administered medications for a least 24 hours  The patient is improving clinically and has a Tmax < 100.5  If you have questions about this conversion, please contact the Pharmacy Department  [x]   [phone] )  [facility] []   [phone] )  [facility]  []   [phone] )  [facility] []   [phone] )  [facility]   S. Margo Aye, PharmD

## 2014-03-12 ENCOUNTER — Telehealth (INDEPENDENT_AMBULATORY_CARE_PROVIDER_SITE_OTHER): Payer: Self-pay | Admitting: *Deleted

## 2014-03-12 ENCOUNTER — Other Ambulatory Visit (INDEPENDENT_AMBULATORY_CARE_PROVIDER_SITE_OTHER): Payer: Self-pay | Admitting: Internal Medicine

## 2014-03-12 ENCOUNTER — Other Ambulatory Visit (INDEPENDENT_AMBULATORY_CARE_PROVIDER_SITE_OTHER): Payer: Self-pay | Admitting: *Deleted

## 2014-03-12 DIAGNOSIS — R109 Unspecified abdominal pain: Secondary | ICD-10-CM

## 2014-03-12 MED ORDER — OXYCODONE HCL 10 MG PO TABS: 10.0000 mg | ORAL_TABLET | Freq: Three times a day (TID) | ORAL | Status: DC

## 2014-03-12 NOTE — Telephone Encounter (Signed)
Patient is requesting refill on this medication. Last filled 03-10-13

## 2014-03-12 NOTE — Telephone Encounter (Signed)
David [name] left me a message on Sunday,03-11-14. When his pain worsened before admission to the hospital, he states that he took 4 of his pain medication instead of the 3. He is out of today 03-12-14. Returned this call to patient, he was asking about the left message 03-11-14. Patient advised that this would be discussed with Dr.Rehman and I would call him back.

## 2014-03-12 NOTE — Telephone Encounter (Signed)
Dr.Rehman is going to write Rx, Patient to be contacted.

## 2014-03-12 NOTE — Telephone Encounter (Signed)
Would like to speak with [name] if she could please return the call at [phone].

## 2014-03-14 ENCOUNTER — Encounter (HOSPITAL_COMMUNITY): Payer: Self-pay | Admitting: Internal Medicine

## 2014-03-28 ENCOUNTER — Telehealth (INDEPENDENT_AMBULATORY_CARE_PROVIDER_SITE_OTHER): Payer: Self-pay | Admitting: *Deleted

## 2014-03-28 NOTE — Telephone Encounter (Signed)
David [name] called the week of 03-19-14. He was in need of a refill on his prednisone. Per Dorene Arerri [name] NP it was refilled. [facility]. Patient was called and made aware.

## 2014-03-30 ENCOUNTER — Other Ambulatory Visit (INDEPENDENT_AMBULATORY_CARE_PROVIDER_SITE_OTHER): Payer: Self-pay | Admitting: *Deleted

## 2014-03-30 NOTE — Telephone Encounter (Signed)
Patient is requesting a written prescription for the Opana. Last written on 03-01-14.

## 2014-04-02 MED ORDER — OXYMORPHONE HCL ER 10 MG PO TB12: 10.0000 mg | ORAL_TABLET | Freq: Every day | ORAL | Status: DC

## 2014-04-09 ENCOUNTER — Telehealth (INDEPENDENT_AMBULATORY_CARE_PROVIDER_SITE_OTHER): Payer: Self-pay | Admitting: *Deleted

## 2014-04-09 ENCOUNTER — Other Ambulatory Visit (INDEPENDENT_AMBULATORY_CARE_PROVIDER_SITE_OTHER): Payer: Self-pay | Admitting: Internal Medicine

## 2014-04-09 ENCOUNTER — Other Ambulatory Visit (INDEPENDENT_AMBULATORY_CARE_PROVIDER_SITE_OTHER): Payer: Self-pay | Admitting: *Deleted

## 2014-04-09 DIAGNOSIS — R109 Unspecified abdominal pain: Secondary | ICD-10-CM

## 2014-04-09 DIAGNOSIS — K5090 Crohn's disease, unspecified, without complications: Secondary | ICD-10-CM

## 2014-04-09 MED ORDER — OXYCODONE HCL 10 MG PO TABS: 10.0000 mg | ORAL_TABLET | Freq: Three times a day (TID) | ORAL | Status: DC

## 2014-04-09 NOTE — Telephone Encounter (Signed)
This has been redo  As the one Dr.Rehman did previous did not print. Dr.Rehman states that the patient will have to get the change in his medication form pain management.

## 2014-04-09 NOTE — Telephone Encounter (Signed)
Per Dr.Rehman , He will not write prescriptions for 20 mg taking 2 by mouth three times a day. He will write the 10 mg take 1 by mouth three times daily. For an increase in dosing he will need to see pain management.

## 2014-04-09 NOTE — Telephone Encounter (Signed)
This needs to be a refill request.

## 2014-04-09 NOTE — Telephone Encounter (Signed)
Patient left the following message: Please ask Dr.Rehman if he will change the Oxycodone to 20 mg -Take 2 by mouth three times daily. Taking 1 10 mg by mouth three times a day is not working,pain with the Crohn's where it is ,the pain is unbearable.  This last presription was written on 03-12-14. Patient states that he started taking 2 by mouth yesterday, as the pain was great. He states that he will run out tomorrow or Wednesday.

## 2014-04-12 ENCOUNTER — Telehealth (INDEPENDENT_AMBULATORY_CARE_PROVIDER_SITE_OTHER): Payer: Self-pay | Admitting: *Deleted

## 2014-04-12 NOTE — Telephone Encounter (Signed)
David [name] has called and would like to start the pain management referral process. He prefers to be seen by another physician, that his previous pain management. Per Dr.Rehman , may precede with this.

## 2014-04-12 NOTE — Telephone Encounter (Signed)
Referral faxed to [facility]

## 2014-04-23 ENCOUNTER — Telehealth (INDEPENDENT_AMBULATORY_CARE_PROVIDER_SITE_OTHER): Payer: Self-pay | Admitting: *Deleted

## 2014-04-23 NOTE — Telephone Encounter (Signed)
Patient called/message left , states that for about 1 1/2 week his stool has been dark in color. He says dark-clay color. Dr.Rehman to be made aware.

## 2014-04-25 ENCOUNTER — Telehealth (INDEPENDENT_AMBULATORY_CARE_PROVIDER_SITE_OTHER): Payer: Self-pay | Admitting: *Deleted

## 2014-04-25 DIAGNOSIS — K5090 Crohn's disease, unspecified, without complications: Secondary | ICD-10-CM

## 2014-04-25 DIAGNOSIS — R195 Other fecal abnormalities: Secondary | ICD-10-CM

## 2014-04-25 NOTE — Telephone Encounter (Signed)
Labs are noted . Hemoccult card ready for pick up. Patient was called and made aware.

## 2014-04-25 NOTE — Telephone Encounter (Signed)
Patient needs Hemoccult x1, CBC and LFTs

## 2014-04-25 NOTE — Telephone Encounter (Signed)
Per Dr.Rehman the patient will need to have labs drawn now. Patient will also be given a hemoccult.

## 2014-04-30 ENCOUNTER — Telehealth (INDEPENDENT_AMBULATORY_CARE_PROVIDER_SITE_OTHER): Payer: Self-pay | Admitting: *Deleted

## 2014-04-30 ENCOUNTER — Other Ambulatory Visit (INDEPENDENT_AMBULATORY_CARE_PROVIDER_SITE_OTHER): Payer: Self-pay | Admitting: *Deleted

## 2014-04-30 DIAGNOSIS — R195 Other fecal abnormalities: Secondary | ICD-10-CM

## 2014-04-30 DIAGNOSIS — K921 Melena: Secondary | ICD-10-CM

## 2014-04-30 MED ORDER — OXYMORPHONE HCL ER 10 MG PO TB12: 10.0000 mg | ORAL_TABLET | Freq: Every day | ORAL | Status: DC

## 2014-04-30 NOTE — Telephone Encounter (Signed)
   Diagnosis:    Result(s)   Card 1: Positive         Completed by: Betta Balla,LPN   HEMOCCULT SENSA DEVELOPER: LOT#:  [id number] EXPIRATION DATE: 2016-05   HEMOCCULT SENSA CARD:  LOT#:  [id number] EXPIRATION DATE: 08/2014   CARD CONTROL RESULTS:  POSITIVE: Positive  NEGATIVE: Negative    ADDITIONAL COMMENTS:

## 2014-04-30 NOTE — Telephone Encounter (Signed)
Hemoccult results noted. It is positive.

## 2014-04-30 NOTE — Telephone Encounter (Signed)
This was last written on 04/02/14.

## 2014-04-30 NOTE — Telephone Encounter (Signed)
Per Dr.Rehman may call in Bentyl 10 mg - Take 1 by mouth three times a day #90 with 5 refills. [facility]. Patient was made aware.

## 2014-05-01 ENCOUNTER — Other Ambulatory Visit (INDEPENDENT_AMBULATORY_CARE_PROVIDER_SITE_OTHER): Payer: Self-pay | Admitting: *Deleted

## 2014-05-01 LAB — CBC
HCT: 31.1 % — ABNORMAL LOW (ref 39.0–52.0)
Hemoglobin: 9.2 g/dL — ABNORMAL LOW (ref 13.0–17.0)
MCH: 22.3 pg — ABNORMAL LOW (ref 26.0–34.0)
MCHC: 29.6 g/dL — ABNORMAL LOW (ref 30.0–36.0)
MCV: 75.5 fL — ABNORMAL LOW (ref 78.0–100.0)
PLATELETS: 148 10*3/uL — AB (ref 150–400)
RBC: 4.12 MIL/uL — ABNORMAL LOW (ref 4.22–5.81)
RDW: 19.1 % — AB (ref 11.5–15.5)
WBC: 8 10*3/uL (ref 4.0–10.5)

## 2014-05-01 LAB — HEPATIC FUNCTION PANEL
ALT: 24 U/L (ref 0–53)
AST: 28 U/L (ref 0–37)
Albumin: 2.8 g/dL — ABNORMAL LOW (ref 3.5–5.2)
Alkaline Phosphatase: 128 U/L — ABNORMAL HIGH (ref 39–117)
BILIRUBIN DIRECT: 0.2 mg/dL (ref 0.0–0.3)
BILIRUBIN INDIRECT: 0.3 mg/dL (ref 0.2–1.2)
Total Bilirubin: 0.5 mg/dL (ref 0.2–1.2)
Total Protein: 4.9 g/dL — ABNORMAL LOW (ref 6.0–8.3)

## 2014-05-01 MED ORDER — PREDNISONE 10 MG PO TABS: 30.0000 mg | ORAL_TABLET | Freq: Every day | ORAL | Status: DC

## 2014-05-01 NOTE — Telephone Encounter (Signed)
Patient called and ask for a refill on his Prednisone 10 mg. He is currently taking 3 per day. He states that he cannot drop past the 30 mg a day.

## 2014-05-04 ENCOUNTER — Emergency Department (HOSPITAL_COMMUNITY)
Admission: EM | Admit: 2014-05-04 | Discharge: 2014-05-04 | Disposition: A | Discharge status: Home / Self Care | Payer: Medicare Other | Attending: Emergency Medicine | Admitting: Emergency Medicine

## 2014-05-04 ENCOUNTER — Encounter (HOSPITAL_COMMUNITY): Admission: RE | Admit: 2014-05-04 | Payer: Medicare Other | Source: Ambulatory Visit

## 2014-05-04 ENCOUNTER — Encounter (HOSPITAL_COMMUNITY): Payer: Self-pay | Admitting: Emergency Medicine

## 2014-05-04 DIAGNOSIS — I10 Essential (primary) hypertension: Secondary | ICD-10-CM | POA: Insufficient documentation

## 2014-05-04 DIAGNOSIS — Z8701 Personal history of pneumonia (recurrent): Secondary | ICD-10-CM | POA: Insufficient documentation

## 2014-05-04 DIAGNOSIS — IMO0002 Reserved for concepts with insufficient information to code with codable children: Secondary | ICD-10-CM | POA: Insufficient documentation

## 2014-05-04 DIAGNOSIS — Z87442 Personal history of urinary calculi: Secondary | ICD-10-CM | POA: Insufficient documentation

## 2014-05-04 DIAGNOSIS — Z8659 Personal history of other mental and behavioral disorders: Secondary | ICD-10-CM | POA: Insufficient documentation

## 2014-05-04 DIAGNOSIS — R609 Edema, unspecified: Secondary | ICD-10-CM | POA: Insufficient documentation

## 2014-05-04 DIAGNOSIS — Z79899 Other long term (current) drug therapy: Secondary | ICD-10-CM | POA: Insufficient documentation

## 2014-05-04 DIAGNOSIS — R601 Generalized edema: Secondary | ICD-10-CM

## 2014-05-04 DIAGNOSIS — I319 Disease of pericardium, unspecified: Secondary | ICD-10-CM | POA: Insufficient documentation

## 2014-05-04 DIAGNOSIS — F17200 Nicotine dependence, unspecified, uncomplicated: Secondary | ICD-10-CM | POA: Insufficient documentation

## 2014-05-04 DIAGNOSIS — K219 Gastro-esophageal reflux disease without esophagitis: Secondary | ICD-10-CM | POA: Insufficient documentation

## 2014-05-04 DIAGNOSIS — K7460 Unspecified cirrhosis of liver: Secondary | ICD-10-CM

## 2014-05-04 DIAGNOSIS — D649 Anemia, unspecified: Secondary | ICD-10-CM | POA: Insufficient documentation

## 2014-05-04 DIAGNOSIS — N529 Male erectile dysfunction, unspecified: Secondary | ICD-10-CM | POA: Insufficient documentation

## 2014-05-04 LAB — CBC WITH DIFFERENTIAL/PLATELET
Basophils Absolute: 0 10*3/uL (ref 0.0–0.1)
Basophils Relative: 0 % (ref 0–1)
EOS ABS: 0.1 10*3/uL (ref 0.0–0.7)
Eosinophils Relative: 1 % (ref 0–5)
HCT: 30.8 % — ABNORMAL LOW (ref 39.0–52.0)
HEMOGLOBIN: 9.4 g/dL — AB (ref 13.0–17.0)
Lymphocytes Relative: 13 % (ref 12–46)
Lymphs Abs: 1.1 10*3/uL (ref 0.7–4.0)
MCH: 22.8 pg — AB (ref 26.0–34.0)
MCHC: 30.5 g/dL (ref 30.0–36.0)
MCV: 74.6 fL — ABNORMAL LOW (ref 78.0–100.0)
MONOS PCT: 13 % — AB (ref 3–12)
Monocytes Absolute: 1.1 10*3/uL — ABNORMAL HIGH (ref 0.1–1.0)
Neutro Abs: 6.1 10*3/uL (ref 1.7–7.7)
Neutrophils Relative %: 72 % (ref 43–77)
Platelets: 139 10*3/uL — ABNORMAL LOW (ref 150–400)
RBC: 4.13 MIL/uL — AB (ref 4.22–5.81)
RDW: 19.5 % — ABNORMAL HIGH (ref 11.5–15.5)
WBC: 8.5 10*3/uL (ref 4.0–10.5)

## 2014-05-04 LAB — COMPREHENSIVE METABOLIC PANEL
ALK PHOS: 229 U/L — AB (ref 39–117)
ALT: 43 U/L (ref 0–53)
AST: 37 U/L (ref 0–37)
Albumin: 2.6 g/dL — ABNORMAL LOW (ref 3.5–5.2)
BUN: 13 mg/dL (ref 6–23)
CO2: 35 mEq/L — ABNORMAL HIGH (ref 19–32)
Calcium: 8.2 mg/dL — ABNORMAL LOW (ref 8.4–10.5)
Chloride: 97 mEq/L (ref 96–112)
Creatinine, Ser: 0.82 mg/dL (ref 0.50–1.35)
GLUCOSE: 152 mg/dL — AB (ref 70–99)
POTASSIUM: 3.5 meq/L — AB (ref 3.7–5.3)
Sodium: 141 mEq/L (ref 137–147)
TOTAL PROTEIN: 5.3 g/dL — AB (ref 6.0–8.3)
Total Bilirubin: 0.6 mg/dL (ref 0.3–1.2)

## 2014-05-04 MED ORDER — METOLAZONE 5 MG PO TABS: 5.0000 mg | ORAL_TABLET | Freq: Every day | ORAL | Status: DC

## 2014-05-04 NOTE — ED Provider Notes (Signed)
CSN: [id number]     Arrival date & time 05/04/14  0912 History   First MD Initiated Contact with Patient 05/04/14 [phone]     Chief Complaint  Patient presents with  . Leg Swelling     (Consider location/radiation/quality/duration/timing/severity/associated sxs/prior Treatment) The history is provided by the patient.   patient has a history of cirrhosis. He has had increasing swelling in his bilateral lower extremities for last few days. He is also had swelling in his scrotum. He states his scrotum does not normally smoke. No difficulty urinating. He states he is on chronic Lasix at 80 mg and was increased to 200 mg last couple days without much improvement. A chest pain. No difficulty breathing. No change in his urination. No abdominal pain.  Past Medical History  Diagnosis Date  . 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 Hepatomegaly 05/04/2011  . Fatty liver 05/04/2011    "stage III fatty liver fibrosis" (11/15/2012)  . GERD (gastroesophageal reflux disease)   . Chronic liver disease     /notes 11/15/2012  . Pericarditis     [name] 11/15/2012  . Hypertension   . Pneumonia 1977  . Shortness of breath     "all the time right now" (11/15/2012)  . History of blood transfusion 2004  . History of stomach ulcers   . Duodenal ulcer   . Depression   . Kidney stones     bilaterally/notes 11/15/2012  . Hepatic fibrosis     [name] 11/15/2012  . Anemia   . Pericardial effusion 10/29/2012    moderate to large/notes 11/15/2012  . Anxiety   . Hepatitis   . Crohn's disease   . ED (erectile dysfunction)    Past Surgical History  Procedure Laterality Date  . Anal examination under anesthesia  02/11/2011    [name]  . Treatment fistula anal  07/03/11    This was a second surgery to repair Anal fistula  . Small intestine surgery  2004    "had hole cut in small intestines during endoscopy; had to have OR & leave  me open 3 months" (11/15/2012)  . Cholecystectomy  ~ 2003  . Appendectomy  ~ 2003  . Video assisted thoracoscopy  11/17/2012    Procedure: VIDEO ASSISTED THORACOSCOPY;  Surgeon: Loreli Slot, MD;  Location: [facility] OR;  Service: Thoracic;  Laterality: Left;  drainage of left pleural effusion  . Pericardial window  11/17/2012    Procedure: PERICARDIAL WINDOW;  Surgeon: Loreli Slot, MD;  Location: [facility] OR;  Service: Thoracic;  Laterality: N/A;  . Esophagogastroduodenoscopy  01/06/2013    Procedure: ESOPHAGOGASTRODUODENOSCOPY (EGD);  Surgeon: Malissa Hippo, MD;  Location: [facility] SUITE;  Service: Endoscopy;  Laterality: N/A;  11:15  . Esophagogastroduodenoscopy N/A 03/09/2014    Procedure: ESOPHAGOGASTRODUODENOSCOPY (EGD);  Surgeon: Malissa Hippo, MD;  Location: [facility] SUITE;  Service: Endoscopy;  Laterality: N/A;   Family History  Problem Relation Age of Onset  . Diabetes Mother   . Diabetes Brother   . Healthy Daughter   . Healthy Son    History  Substance Use Topics  . Smoking status: Light Tobacco Smoker -- 0.50 packs/day for 24 years    Types: Cigarettes    Start date: 10/28/2012    Last Attempt to Quit: 11/22/2012  . Smokeless tobacco: Current User    Types: Snuff     Comment: 1/2 pack a month  . Alcohol  Use: No    Review of Systems  Constitutional: Negative for activity change and appetite change.  Eyes: Negative for pain.  Respiratory: Negative for chest tightness and shortness of breath.   Cardiovascular: Positive for leg swelling. Negative for chest pain.  Gastrointestinal: Negative for nausea, vomiting, abdominal pain and diarrhea.  Genitourinary: Negative for flank pain, decreased urine volume and difficulty urinating.       Scrotal swelling  Musculoskeletal: Negative for back pain and neck stiffness.  Skin: Negative for rash and wound.  Neurological: Negative for weakness, numbness and headaches.  Psychiatric/Behavioral: Negative for behavioral problems.       Allergies  Remicade; Fentanyl and related; Wellbutrin; and Morphine and related  Home Medications   Prior to Admission medications   Medication Sig Start Date End Date Taking? Authorizing Provider  calcium carbonate (OS-CAL) 600 MG TABS Take 600 mg by mouth 2 (two) times daily with a meal.   Yes Historical Provider, MD  carvedilol (COREG) 6.25 MG tablet Take 6.25 mg by mouth 2 (two) times daily.  10/23/13 10/23/14 Yes Historical Provider, MD  dicyclomine (BENTYL) 10 MG capsule Take 1 capsule by mouth daily as needed (stomach cramps).  04/30/14  Yes Historical Provider, MD  ferrous sulfate 325 (65 FE) MG tablet Take 325 mg by mouth 2 (two) times daily. 11/22/12  Yes Wayne E Gold, PA-C  fish oil-omega-3 fatty acids 1000 MG capsule Take 1 g by mouth 3 (three) times daily. 02/09/12  Yes Malissa Hippo, MD  furosemide (LASIX) 40 MG tablet Take 80 mg by mouth daily.   Yes Historical Provider, MD  metoCLOPramide (REGLAN) 5 MG tablet Take 1 tablet (5 mg total) by mouth 3 (three) times daily before meals. 03/10/14  Yes Zannie Cove, MD  metolazone (ZAROXOLYN) 5 MG tablet Take 5 mg by mouth 2 (two) times daily as needed (for blood pressure.).   Yes Historical Provider, MD  Multiple Vitamins-Minerals (MULTIVITAMINS THER. W/MINERALS) TABS Take 1 tablet by mouth 2 (two) times daily.    Yes Historical Provider, MD  Oxycodone HCl 10 MG TABS Take 1 tablet (10 mg total) by mouth 3 (three) times daily. 04/09/14  Yes Malissa Hippo, MD  oxymorphone (OPANA ER) 10 MG 12 hr tablet Take 1 tablet (10 mg total) by mouth at bedtime. 04/30/14  Yes Malissa Hippo, MD  pantoprazole (PROTONIX) 40 MG tablet Take 1 tablet (40 mg total) by mouth 2 (two) times daily. 03/10/14  Yes Zannie Cove, MD  potassium chloride (K-DUR,KLOR-CON) 10 MEQ tablet Take 20 mEq by mouth daily.   Yes Historical Provider, MD  predniSONE (DELTASONE) 10 MG tablet Take 3 tablets (30 mg total) by mouth daily. 05/01/14  Yes Malissa Hippo, MD   Probiotic Product (ALIGN) 4 MG CAPS Take 4 mg by mouth daily.    Yes Historical Provider, MD  promethazine (PHENERGAN) 25 MG tablet Take 25 mg by mouth every 6 (six) hours as needed. nausea   Yes Historical Provider, MD  spironolactone (ALDACTONE) 50 MG tablet Take 50 mg by mouth daily.  10/31/12  Yes Malissa Hippo, MD  ursodiol (ACTIGALL) 300 MG capsule Take 600 mg by mouth 2 (two) times daily.   Yes Historical Provider, MD   BP 120/67  Pulse 90  Temp(Src) 98.2 F (36.8 C) (Oral)  Resp 16  SpO2 99% Physical Exam  Nursing note and vitals reviewed. Constitutional: He is oriented to person, place, and time. He appears well-developed and well-nourished.  Cardiovascular: Normal rate.  Heart rate regular on my examination  Pulmonary/Chest: Effort normal and breath sounds normal. He has no rales.  Abdominal: There is tenderness.  Midline scar and other scars from previous surgeries. Mild distention. Mild tenderness. At baseline for patient.  Genitourinary:  Moderate scrotal edema with some induration of skin. Minimal erythema  Musculoskeletal: He exhibits edema.  Moderate edema to bilateral lower extremities. Sensation grossly intact  Neurological: He is alert and oriented to person, place, and time.  Skin: Skin is warm.    ED Course  Procedures (including critical care time) Labs Review Labs Reviewed  CBC WITH DIFFERENTIAL - Abnormal; Notable for the following:    RBC 4.13 (*)    Hemoglobin 9.4 (*)    HCT 30.8 (*)    MCV 74.6 (*)    MCH 22.8 (*)    RDW 19.5 (*)    Platelets 139 (*)    Monocytes Relative 13 (*)    Monocytes Absolute 1.1 (*)    All other components within normal limits  COMPREHENSIVE METABOLIC PANEL - Abnormal; Notable for the following:    Potassium 3.5 (*)    CO2 35 (*)    Glucose, Bld 152 (*)    Calcium 8.2 (*)    Total Protein 5.3 (*)    Albumin 2.6 (*)    Alkaline Phosphatase 229 (*)    All other components within normal limits    Imaging  Review No results found.   EKG Interpretation None      MDM   Final diagnoses:  Anasarca  Liver cirrhosis    Patient with edema secondary cirrhosis. Discussed with the patient's gastroenterologist. Medication has been adjusted and patient would be eager to go home as opposed to admission.  Juliet RudeNathan R. Rubin PayorPickering, MD 05/04/14 [phone]

## 2014-05-04 NOTE — Discharge Instructions (Signed)
Cirrhosis Cirrhosis is a condition of scarring of the liver which is caused when the liver has tried repairing itself following damage. This damage may come from a previous infection such as one of the forms of hepatitis (usually hepatitis C), or the damage may come from being injured by toxins. The main toxin that causes this damage is alcohol. The scarring of the liver from use of alcohol is irreversible. That means the liver cannot return to normal even though alcohol is not used any more. The main danger of hepatitis C infection is that it may cause long-lasting (chronic) liver disease, and this also may lead to cirrhosis. This complication is progressive and irreversible. CAUSES  Prior to available blood tests, hepatitis C could be contracted by blood transfusions. Since testing of blood has improved, this is now unlikely. This infection can also be contracted through intravenous drug use and the sharing of needles. It can also be contracted through sexual relationships. The injury caused by alcohol comes from too much use. It is not a few drinks that poison the liver, but years of misuse. Usually there will be some signs and symptoms early with scarring of the liver that suggest the development of better habits. Alcohol should never be used while using acetaminophen. A small dose of both taken together may cause irreversible damage to the liver. HOME CARE INSTRUCTIONS  There is no specific treatment for cirrhosis. However, there are things you can do to avoid making the condition worse.  Rest as needed.  Eat a well-balanced diet. Your caregiver can help you with suggestions.  Vitamin supplements including vitamins A, K, D, and thiamine can help.  A low-salt diet, water restriction, or diuretic medicine may be needed to reduce fluid retention.  Avoid alcohol. This can be extremely toxic if combined with acetaminophen.  Avoid drugs which are toxic to the liver. Some of these include isoniazid,  methyldopa, acetaminophen, anabolic steroids (muscle-building drugs), erythromycin, and oral contraceptives (birth control pills). Check with your caregiver to make sure medicines you are presently taking will not be harmful.  Periodic blood tests may be required. Follow your caregiver's advice regarding the timing of these.  Milk thistle is an herbal remedy which does protect the liver against toxins. However, it will not help once the liver has been scarred. SEEK MEDICAL CARE IF:  You have increasing fatigue or weakness.  You develop swelling of the hands, feet, legs, or face.  You vomit bright red blood, or a coffee ground appearing material.  You have blood in your stools, or the stools turn black and tarry.  You have a fever.  You develop loss of appetite, or have nausea and vomiting.  You develop jaundice.  You develop easy bruising or bleeding.  You have worsening of any of the problems you are concerned about. Document Released: 11/30/2005 Document Revised: 02/22/2012 Document Reviewed: 07/18/2008 [facility] Patient Information 2014 [place], [place].  Edema Edema is an abnormal build-up of fluids in tissues. Because this is partly dependent on gravity (water flows to the lowest place), it is more common in the legs and thighs (lower extremities). It is also common in the looser tissues, like around the eyes. Painless swelling of the feet and ankles is common and increases as a person ages. It may affect both legs and may include the calves or even thighs. When squeezed, the fluid may move out of the affected area and may leave a dent for a few moments. CAUSES   Prolonged standing or  sitting in one place for extended periods of time. Movement helps pump tissue fluid into the veins, and absence of movement prevents this, resulting in edema.  Varicose veins. The valves in the veins do not work as well as they should. This causes fluid to leak into the tissues.  Fluid and salt  overload.  Injury, burn, or surgery to the leg, ankle, or foot, may damage veins and allow fluid to leak out.  Sunburn damages vessels. Leaky vessels allow fluid to go out into the sunburned tissues.  Allergies (from insect bites or stings, medications or chemicals) cause swelling by allowing vessels to become leaky.  Protein in the blood helps keep fluid in your vessels. Low protein, as in malnutrition, allows fluid to leak out.  Hormonal changes, including pregnancy and menstruation, cause fluid retention. This fluid may leak out of vessels and cause edema.  Medications that cause fluid retention. Examples are sex hormones, blood pressure medications, steroid treatment, or anti-depressants.  Some illnesses cause edema, especially heart failure, kidney disease, or liver disease.  Surgery that cuts veins or lymph nodes, such as surgery done for the heart or for breast cancer, may result in edema. DIAGNOSIS  Your caregiver is usually easily able to determine what is causing your swelling (edema) by simply asking what is wrong (getting a history) and examining you (doing a physical). Sometimes x-rays, EKG (electrocardiogram or heart tracing), and blood work may be done to evaluate for underlying medical illness. TREATMENT  General treatment includes:  Leg elevation (or elevation of the affected body part).  Restriction of fluid intake.  Prevention of fluid overload.  Compression of the affected body part. Compression with elastic bandages or support stockings squeezes the tissues, preventing fluid from entering and forcing it back into the blood vessels.  Diuretics (also called water pills or fluid pills) pull fluid out of your body in the form of increased urination. These are effective in reducing the swelling, but can have side effects and must be used only under your caregiver's supervision. Diuretics are appropriate only for some types of edema. The specific treatment can be  directed at any underlying causes discovered. Heart, liver, or kidney disease should be treated appropriately. HOME CARE INSTRUCTIONS   Elevate the legs (or affected body part) above the level of the heart, while lying down.  Avoid sitting or standing still for prolonged periods of time.  Avoid putting anything directly under the knees when lying down, and do not wear constricting clothing or garters on the upper legs.  Exercising the legs causes the fluid to work back into the veins and lymphatic channels. This may help the swelling go down.  The pressure applied by elastic bandages or support stockings can help reduce ankle swelling.  A low-salt diet may help reduce fluid retention and decrease the ankle swelling.  Take any medications exactly as prescribed. SEEK MEDICAL CARE IF:  Your edema is not responding to recommended treatments. SEEK IMMEDIATE MEDICAL CARE IF:   You develop shortness of breath or chest pain.  You cannot breathe when you lay down; or if, while lying down, you have to get up and go to the window to get your breath.  You are having increasing swelling without relief from treatment.  You develop a fever over 102 F (38.9 C).  You develop pain or redness in the areas that are swollen.  Tell your caregiver right away if you have gained 03 lb/1.4 kg in 1 day or 05 lb/2.3 kg  in a week. MAKE SURE YOU:   Understand these instructions.  Will watch your condition.  Will get help right away if you are not doing well or get worse. Document Released: 11/30/2005 Document Revised: 05/31/2012 Document Reviewed: 07/18/2008 [facility] Patient Information 2014 [place], [place].

## 2014-05-04 NOTE — ED Notes (Signed)
Pt presents with bilaterally leg swelling that began yesterday morning. Pt hx of liver disease. Pt takes Lasix at home 80mg  twice a day but states "I took 120mg  twice a day the past 2 days to help" but denies relief. Pt denies SOB, denies any pain. Pt ambulatory on arrival to ED. Pedal pulse palpated bilaterally.

## 2014-05-08 ENCOUNTER — Other Ambulatory Visit (INDEPENDENT_AMBULATORY_CARE_PROVIDER_SITE_OTHER): Payer: Self-pay | Admitting: *Deleted

## 2014-05-08 DIAGNOSIS — R109 Unspecified abdominal pain: Secondary | ICD-10-CM

## 2014-05-08 DIAGNOSIS — K5090 Crohn's disease, unspecified, without complications: Secondary | ICD-10-CM

## 2014-05-08 MED ORDER — SPIRONOLACTONE 50 MG PO TABS: 50.0000 mg | ORAL_TABLET | Freq: Every day | ORAL | Status: DC

## 2014-05-08 MED ORDER — OXYCODONE HCL 10 MG PO TABS: 10.0000 mg | ORAL_TABLET | Freq: Three times a day (TID) | ORAL | Status: DC

## 2014-05-08 MED ORDER — OXYMORPHONE HCL ER 10 MG PO TB12: 10.0000 mg | ORAL_TABLET | Freq: Every day | ORAL | Status: DC

## 2014-05-08 NOTE — Telephone Encounter (Signed)
Patient called and is requesting a refill on his Oxycodone and Spironolactone

## 2014-05-08 NOTE — Telephone Encounter (Signed)
Patient also gave a progress report. He states in the message that he had to go to the ED on Friday , due to fluid. David [name] states that the fluid is much better,down a lot. Just some in his legs.

## 2014-05-08 NOTE — Telephone Encounter (Signed)
Patient called and requested that these 2 prescriptions be filled. The pain medication Oxycodone was last written on 04/09/14.

## 2014-05-10 ENCOUNTER — Encounter (HOSPITAL_COMMUNITY)
Admission: RE | Admit: 2014-05-10 | Discharge: 2014-05-10 | Disposition: A | Discharge status: Home / Self Care | Payer: Medicare Other | Source: Ambulatory Visit | Attending: Internal Medicine | Admitting: Internal Medicine

## 2014-05-10 DIAGNOSIS — D509 Iron deficiency anemia, unspecified: Secondary | ICD-10-CM | POA: Insufficient documentation

## 2014-05-10 MED ORDER — SODIUM CHLORIDE 0.9 % IV SOLN
510.0000 mg | Freq: Once | INTRAVENOUS | Status: AC
  Administered 2014-05-10: 510 mg via INTRAVENOUS
  Filled 2014-05-10: qty 17

## 2014-05-10 MED ORDER — SODIUM CHLORIDE 0.9 % IJ SOLN: 10.0000 mL | Freq: Once | INTRAMUSCULAR | Status: DC

## 2014-05-10 MED ORDER — FERUMOXYTOL INJECTION 510 MG/17 ML: 510.0000 mg | Freq: Once | INTRAVENOUS | Status: DC

## 2014-05-14 ENCOUNTER — Ambulatory Visit (INDEPENDENT_AMBULATORY_CARE_PROVIDER_SITE_OTHER): Payer: Medicare Other | Admitting: Internal Medicine

## 2014-05-14 ENCOUNTER — Encounter (INDEPENDENT_AMBULATORY_CARE_PROVIDER_SITE_OTHER): Payer: Self-pay | Admitting: Internal Medicine

## 2014-05-14 VITALS — BP 110/70 | HR 74 | Temp 97.6°F | Resp 18 | Ht 71.0 in | Wt 214.2 lb

## 2014-05-14 DIAGNOSIS — K7460 Unspecified cirrhosis of liver: Secondary | ICD-10-CM

## 2014-05-14 DIAGNOSIS — R109 Unspecified abdominal pain: Secondary | ICD-10-CM

## 2014-05-14 DIAGNOSIS — D509 Iron deficiency anemia, unspecified: Secondary | ICD-10-CM

## 2014-05-14 DIAGNOSIS — E8770 Fluid overload, unspecified: Secondary | ICD-10-CM

## 2014-05-14 DIAGNOSIS — E8779 Other fluid overload: Secondary | ICD-10-CM

## 2014-05-14 DIAGNOSIS — R112 Nausea with vomiting, unspecified: Secondary | ICD-10-CM

## 2014-05-14 DIAGNOSIS — K5090 Crohn's disease, unspecified, without complications: Secondary | ICD-10-CM

## 2014-05-14 MED ORDER — METOCLOPRAMIDE HCL 5 MG PO TABS: 5.0000 mg | ORAL_TABLET | Freq: Three times a day (TID) | ORAL | Status: DC | PRN

## 2014-05-14 MED ORDER — SPIRONOLACTONE 50 MG PO TABS: 100.0000 mg | ORAL_TABLET | Freq: Every day | ORAL | Status: DC

## 2014-05-14 NOTE — Patient Instructions (Signed)
Physician will call with the results of blood test.

## 2014-05-14 NOTE — Progress Notes (Signed)
Presenting complaint;  Followup for fluid overload, Crohn's disease and iron deficiency anemia.  Subjective:  Patient is 38 year old Caucasian male with multiple medical problems who is here for scheduled visit. He was last seen on 03/07/2014. He was seen in emergency room on 05/04/2014 for weight gain and scrotal edema. Asian was advised to take metolazone daily. He states he has lost close to 30 pounds. He is watching salt and fluid intake. He [facility] week who recommended CT enterography prior to treatment with Entyvio. He doesn't understand why delay in therapy. He continues to complain of right-sided abdominal pain. He states present therapies not working. He states he is is unable to see pain specialist because of financial reasons. Heartburn is well controlled with therapy. However he has intermittent nausea and vomiting/regurgitation usually at night. He is using metoclopramide on an as-needed basis. He is having 2-3 semi-formed stools daily. He hasn't passed any blood per rectum recently. He also denies melena. He received a dose of that our team on 05/09/2014 and next dose due on 05/17/2014.   Current Medications: Outpatient Encounter Prescriptions as of 05/14/2014  Medication Sig  . calcium carbonate (OS-CAL) 600 MG TABS Take 600 mg by mouth 2 (two) times daily with a meal.  . carvedilol (COREG) 6.25 MG tablet Take 6.25 mg by mouth 2 (two) times daily.   Marland Kitchen. dicyclomine (BENTYL) 10 MG capsule Take 1 capsule by mouth daily as needed (stomach cramps).   . ferrous sulfate 325 (65 FE) MG tablet Take 325 mg by mouth 2 (two) times daily.  . fish oil-omega-3 fatty acids 1000 MG capsule Take 1 g by mouth 3 (three) times daily.  . furosemide (LASIX) 40 MG tablet Take 80 mg by mouth daily.  . metoCLOPramide (REGLAN) 5 MG tablet Take 1 tablet (5 mg total) by mouth 3 (three) times daily before meals.  . metolazone (ZAROXOLYN) 5 MG tablet Take 1 tablet (5 mg  total) by mouth daily.  . Multiple Vitamins-Minerals (MULTIVITAMINS THER. W/MINERALS) TABS Take 1 tablet by mouth 2 (two) times daily.   . Oxycodone HCl 10 MG TABS Take 1 tablet (10 mg total) by mouth 3 (three) times daily.  Marland Kitchen. oxymorphone (OPANA ER) 10 MG 12 hr tablet Take 1 tablet (10 mg total) by mouth at bedtime.  . pantoprazole (PROTONIX) 40 MG tablet Take 1 tablet (40 mg total) by mouth 2 (two) times daily.  . potassium chloride (K-DUR,KLOR-CON) 10 MEQ tablet Take 20 mEq by mouth daily.  . predniSONE (DELTASONE) 10 MG tablet Take 3 tablets (30 mg total) by mouth daily.  . Probiotic Product (ALIGN) 4 MG CAPS Take 4 mg by mouth daily.   . promethazine (PHENERGAN) 25 MG tablet Take 25 mg by mouth every 6 (six) hours as needed. nausea  . spironolactone (ALDACTONE) 50 MG tablet Take 1 tablet (50 mg total) by mouth daily.  . ursodiol (ACTIGALL) 300 MG capsule Take 600 mg by mouth 2 (two) times daily.     Objective: Blood pressure 110/70, pulse 74, temperature 97.6 F (36.4 C), temperature source Oral, resp. rate 18, height 5\' 11"  (1.803 m), weight 214 lb 3.2 oz (97.16 kg). Patient is alert and in no acute distress. Conjunctiva is pink. Sclera is nonicteric Oropharyngeal mucosa is normal. No neck masses or thyromegaly noted. Cardiac exam with regular rhythm normal S1 and S2. No murmur or gallop noted. Lungs are clear to auscultation. Abdomen is full. Bowel sounds are normal. On  palpation abdomen is somewhat full and tender in right lower quadrant. Liver edge is firm and tender. Spleen is nonpalpable. He has 2+ pitting edema involving both lower extremities.  No LE edema or clubbing noted.  Labs/studies Results: Lab data from 05/04/2014 reviewed. H&H 9.4 and 30.8 MCV 74.6 and platelet count 139K Serum potassium 3.5, BUN 13, creatinine 0.82 Serum albumin 2.6.  Assessment:  #1. Fluid overload or merrily in the form of lower extremity edema as well as tissue edema and minimal ascites.  Fluid overload primarily secondary to hypoalbuminemia and liver disease with portal hypertension. 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 He is doing better with addition of metolazone and may reap further benefit with higher dose of Spironolactone. #2. Crohn's disease. He remains on modest dose of prednisone. He needs to get off prednisone as soon as possible. I do not understand why he has not been able to get [facility] as recommended by [facility] physicians. #3. Cirrhosis. He has fairly advanced disease and is followed by Dr. Julieta Gutting of [facility]. #4. Iron deficiency anemia. Patient received feraheme last week and will receive another dose this week. #5. Chronic abdominal pain. This is primarily secondary to Crohn's disease but unfortunately he is become dependent on narcotics. Current therapies not working and he does not have resources to see yet another specialist.  Plan:  Increase spinolactone to 100 mg by mouth daily. Patient advised to check his weight every morning and use second dose of furosemide on an as-needed basis. Patient advised to proceed with CT enterography as recommended by Dr[facility]elford [facility] of [facility]. Proceed with iron infusion later this week. Will consider changing pain medications when current prescription runs out. Office visit in in 6 weeks.

## 2014-05-23 ENCOUNTER — Other Ambulatory Visit (INDEPENDENT_AMBULATORY_CARE_PROVIDER_SITE_OTHER): Payer: Self-pay | Admitting: *Deleted

## 2014-05-23 ENCOUNTER — Encounter (INDEPENDENT_AMBULATORY_CARE_PROVIDER_SITE_OTHER): Payer: Self-pay

## 2014-05-23 DIAGNOSIS — K5090 Crohn's disease, unspecified, without complications: Secondary | ICD-10-CM

## 2014-05-23 DIAGNOSIS — R109 Unspecified abdominal pain: Secondary | ICD-10-CM

## 2014-05-23 NOTE — Telephone Encounter (Signed)
Patient called as advised by Dr.Rehman when he would be out of his current prescription of Oxycodone 10 mg. Patient states that he will  out Thursday - June 11 th,2015. The new prescription was going to be changed to Oxycodone 20 mg TID.

## 2014-05-24 ENCOUNTER — Encounter (HOSPITAL_COMMUNITY)
Admission: RE | Admit: 2014-05-24 | Discharge: 2014-05-24 | Disposition: A | Discharge status: Home / Self Care | Payer: Medicare Other | Source: Ambulatory Visit | Attending: Internal Medicine | Admitting: Internal Medicine

## 2014-05-24 ENCOUNTER — Other Ambulatory Visit (INDEPENDENT_AMBULATORY_CARE_PROVIDER_SITE_OTHER): Payer: Self-pay | Admitting: *Deleted

## 2014-05-24 DIAGNOSIS — D509 Iron deficiency anemia, unspecified: Secondary | ICD-10-CM | POA: Insufficient documentation

## 2014-05-24 MED ORDER — SODIUM CHLORIDE 0.9 % IV SOLN
INTRAVENOUS | Status: AC
  Filled 2014-05-24: qty 50

## 2014-05-24 MED ORDER — SODIUM CHLORIDE 0.9 % IV SOLN
510.0000 mg | Freq: Once | INTRAVENOUS | Status: AC
  Administered 2014-05-24: 510 mg via INTRAVENOUS
  Filled 2014-05-24: qty 17

## 2014-05-24 MED ORDER — OXYCODONE HCL 20 MG PO TABS: 20.0000 mg | ORAL_TABLET | Freq: Three times a day (TID) | ORAL | Status: DC

## 2014-05-25 LAB — BASIC METABOLIC PANEL
BUN: 10 mg/dL (ref 6–23)
CALCIUM: 7.7 mg/dL — AB (ref 8.4–10.5)
CHLORIDE: 104 meq/L (ref 96–112)
CO2: 28 meq/L (ref 19–32)
Creat: 0.66 mg/dL (ref 0.50–1.35)
Glucose, Bld: 81 mg/dL (ref 70–99)
Potassium: 4.1 mEq/L (ref 3.5–5.3)
SODIUM: 137 meq/L (ref 135–145)

## 2014-06-01 ENCOUNTER — Other Ambulatory Visit (INDEPENDENT_AMBULATORY_CARE_PROVIDER_SITE_OTHER): Payer: Self-pay | Admitting: *Deleted

## 2014-06-01 DIAGNOSIS — R109 Unspecified abdominal pain: Secondary | ICD-10-CM

## 2014-06-01 DIAGNOSIS — K50913 Crohn's disease, unspecified, with fistula: Secondary | ICD-10-CM

## 2014-06-01 NOTE — Telephone Encounter (Signed)
Patient left voicemail requesting a written prescription for Opana.. This was last written on 05-07-14.

## 2014-06-05 ENCOUNTER — Telehealth (INDEPENDENT_AMBULATORY_CARE_PROVIDER_SITE_OTHER): Payer: Self-pay | Admitting: *Deleted

## 2014-06-05 NOTE — Telephone Encounter (Signed)
Patient called and left the following message on my voicemail this morning @ 9:26 am. He isn't sleeping well , and its embarrassing when he is having BM's while sleeping since he has had no Opana.  On 06-04-14 when [facility] called about his Opana refill. Per Dr.Rehman, the patient will not get anymore Opana. When the prescription  Oxycodone , was changed to 20 mg

## 2014-06-05 NOTE — Telephone Encounter (Signed)
Patient was not taking upon when he was on oxycodone 20 mg every 8 hours. If he thinks his pain is not controlled he needs to go back to pain clinic

## 2014-06-05 NOTE — Telephone Encounter (Signed)
Continuation  Of previous note - Oxycodone was changed to 20 mg three times a day, it was discussed that the Opana would not be written. When this was shared with the patient he explained that this was not his understanding. He thought that he would be given a prescription for the Opana to take at night. Again, per Dr.Rehman this was not what was discussed at the time of the patient's last OV.  Forwarded to Dr.Rehman for review.

## 2014-06-06 NOTE — Telephone Encounter (Signed)
David [name], patient's Mother, left a voicemail on my phone earlier today. She states that she is on her way back to hospital with her brother. David [name] is on his way to [facility]. David [name] ask her to call me and give the count on his Oxycodone ,written on 05-24-14. Patient has 48 left. The Opana , he has 2 left.  This was written on 05-08-14.  Patient had been ask to bring them into the office today, so that we may count them per Dr.Rehman. David [name] is at [facility] today. Dr.Rehman was made aware.

## 2014-06-18 ENCOUNTER — Encounter (INDEPENDENT_AMBULATORY_CARE_PROVIDER_SITE_OTHER): Payer: Self-pay | Admitting: Internal Medicine

## 2014-06-18 ENCOUNTER — Ambulatory Visit (INDEPENDENT_AMBULATORY_CARE_PROVIDER_SITE_OTHER): Payer: Medicare Other | Admitting: Internal Medicine

## 2014-06-18 VITALS — BP 110/68 | HR 78 | Temp 99.0°F | Resp 16 | Ht 71.0 in | Wt 193.4 lb

## 2014-06-18 DIAGNOSIS — K219 Gastro-esophageal reflux disease without esophagitis: Secondary | ICD-10-CM

## 2014-06-18 DIAGNOSIS — R600 Localized edema: Secondary | ICD-10-CM

## 2014-06-18 DIAGNOSIS — R609 Edema, unspecified: Secondary | ICD-10-CM

## 2014-06-18 DIAGNOSIS — R1031 Right lower quadrant pain: Secondary | ICD-10-CM

## 2014-06-18 DIAGNOSIS — D509 Iron deficiency anemia, unspecified: Secondary | ICD-10-CM

## 2014-06-18 MED ORDER — DICYCLOMINE HCL 10 MG PO CAPS: 10.0000 mg | ORAL_CAPSULE | Freq: Three times a day (TID) | ORAL | Status: DC | PRN

## 2014-06-18 MED ORDER — PANTOPRAZOLE SODIUM 40 MG PO TBEC: 40.0000 mg | DELAYED_RELEASE_TABLET | Freq: Two times a day (BID) | ORAL | Status: AC

## 2014-06-18 MED ORDER — FUROSEMIDE 40 MG PO TABS: 80.0000 mg | ORAL_TABLET | Freq: Every day | ORAL | Status: DC

## 2014-06-18 MED ORDER — METOLAZONE 5 MG PO TABS: 5.0000 mg | ORAL_TABLET | Freq: Every day | ORAL | Status: DC | PRN

## 2014-06-18 NOTE — Progress Notes (Signed)
Presenting complaint;  Followup for fluid overload, cirrhosis and Crohn's disease.  Subjective:  Patient is 38 year old Caucasian male who presents for scheduled visit. He has pictures of recent colonoscopy at [facility]. He was last seen on 05/14/2014. He was admitted to [facility] on 06/06/2014 with small bowel obstruction secondary to high grade ileocecal stricture. He was initially treated with IV fluids NG tube and IV steroids. First colonoscopy was on 06/11/2014 when the stricture was dilated to 10 mm. This stricture was estimated to be 7 cm long. Picture was redilated to 12 mm on 06/12/2014 and after this procedure he developed hypotension. CT was negative for perforation he was treated with antibiotics and finally found to have pneumonia. He was discharged on 06/14/2014. He is having less nausea and abdominal pain. He is having an average of 3 stools per day. All his stools are loose. He denies melena or rectal bleeding. His appetite is fair. He denies vomiting. He asked me 1 pound since his last visit 5 weeks ago. He was advised to continue prednisone at 30 mg daily until his next office visit in 2 weeks. He states he had C. difficile during hospitalization was negative. He is not having any side effects with clindamycin.   Current Medications: Outpatient Encounter Prescriptions as of 06/18/2014  Medication Sig  . calcium carbonate (OS-CAL) 600 MG TABS Take 600 mg by mouth 2 (two) times daily with a meal.  . carvedilol (COREG) 6.25 MG tablet Take 6.25 mg by mouth 2 (two) times daily.   . clindamycin (CLEOCIN) 300 MG capsule Take 300 mg by mouth 3 (three) times daily.   Marland Kitchen. dicyclomine (BENTYL) 10 MG capsule Take 1 capsule by mouth daily as needed (stomach cramps).   . ferrous sulfate 325 (65 FE) MG tablet Take 325 mg by mouth 2 (two) times daily.  . fish oil-omega-3 fatty acids 1000 MG capsule Take 1 g by mouth 3 (three) times daily.  . furosemide (LASIX) 40 MG tablet Take 80 mg  by mouth daily.  . metoCLOPramide (REGLAN) 5 MG tablet Take 1 tablet (5 mg total) by mouth 3 (three) times daily as needed for nausea.  . metolazone (ZAROXOLYN) 5 MG tablet Take 1 tablet (5 mg total) by mouth daily.  . Multiple Vitamins-Minerals (MULTIVITAMINS THER. W/MINERALS) TABS Take 1 tablet by mouth 2 (two) times daily.   . Oxycodone HCl 20 MG TABS Take 1 tablet (20 mg total) by mouth 3 (three) times daily.  . pantoprazole (PROTONIX) 40 MG tablet Take 1 tablet (40 mg total) by mouth 2 (two) times daily.  . potassium chloride (K-DUR,KLOR-CON) 10 MEQ tablet Take 20 mEq by mouth daily.  . predniSONE (DELTASONE) 10 MG tablet Take 3 tablets (30 mg total) by mouth daily.  . Probiotic Product (ALIGN) 4 MG CAPS Take 4 mg by mouth daily.   . promethazine (PHENERGAN) 25 MG tablet Take 25 mg by mouth every 6 (six) hours as needed. nausea  . spironolactone (ALDACTONE) 50 MG tablet Take 2 tablets (100 mg total) by mouth daily.  . ursodiol (ACTIGALL) 300 MG capsule Take 600 mg by mouth 2 (two) times daily.  . [DISCONTINUED] oxymorphone (OPANA ER) 10 MG 12 hr tablet Take 1 tablet (10 mg total) by mouth at bedtime.     Objective: Blood pressure 110/68, pulse 78, temperature 99 F (37.2 C), temperature source Oral, resp. rate 16, height 5\' 11"  (1.803 m), weight 193 lb 6.4 oz (87.726 kg). Patient alert and in no acute  distress. Conjunctiva is pink. Sclera is nonicteric Oropharyngeal mucosa is normal. No neck masses or thyromegaly noted. Cardiac exam with regular rhythm normal S1 and S2. No murmur or gallop noted. Lungs are clear to auscultation. Abdomen is full. Bowel sounds are normal. On palpation he has moderate tenderness in the right lower and right upper quadrant. Liver edge is firm 4-5 cm below RCM. He has 2+ edema on ankles and has early clubbing.  Labs/studies Results:  Recent lab studies from [facility] reviewed using care everywhere.  Assessment:  #1. Crohn's disease with aggressive  course  and multiple complications. Now he has developed long high grade stricture status post balloon dilation on 2 successive days last week with relief of obstructive symptoms. He may need repeat dilation to be determined at the time of next visit at [facility].  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 Serum albumin last week was 2 g. He is followed at [facility] by Dr. Julieta Gutting. #3. Fluid overload has been primarily in the form of lower extremity edema without ascites. He is doing reasonably well with furosemide and spironolactone daily and when necessary metolazone. #4. Iron deficiency anemia. #5. 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    Plan:  Patient given new prescription for pantoprazole dicyclomine and furosemide. CBC and metabolic 7 on 06/29/2014. Office visit in 2 months.

## 2014-06-18 NOTE — Patient Instructions (Signed)
CBC and metabolic 7 to be done on 06/29/2014. Take metolazone on as-needed basis. Notify if stool frequency increases or he have a temp greater than 100 F

## 2014-06-20 ENCOUNTER — Other Ambulatory Visit (INDEPENDENT_AMBULATORY_CARE_PROVIDER_SITE_OTHER): Payer: Self-pay | Admitting: *Deleted

## 2014-06-20 ENCOUNTER — Encounter (INDEPENDENT_AMBULATORY_CARE_PROVIDER_SITE_OTHER): Payer: Self-pay | Admitting: *Deleted

## 2014-06-20 DIAGNOSIS — R600 Localized edema: Secondary | ICD-10-CM

## 2014-06-20 DIAGNOSIS — D509 Iron deficiency anemia, unspecified: Secondary | ICD-10-CM

## 2014-06-20 NOTE — Telephone Encounter (Signed)
Patient called and request a written RX for his Oxycodone. This was last written on 05/24/14. Patient is asking if he may pick it up on Thursday,he will not be able to get here before we close on Friday.

## 2014-06-21 ENCOUNTER — Telehealth (INDEPENDENT_AMBULATORY_CARE_PROVIDER_SITE_OTHER): Payer: Self-pay | Admitting: *Deleted

## 2014-06-21 MED ORDER — OXYCODONE HCL 20 MG PO TABS: 20.0000 mg | ORAL_TABLET | Freq: Three times a day (TID) | ORAL | Status: DC

## 2014-06-21 NOTE — Telephone Encounter (Signed)
Thanks for calling [facility]. He can calm and pick up the prescription tomorrow but he needs to follow instructions. He should have at least 15 doses left since he was at [facility] in did not use his own medication

## 2014-06-21 NOTE — Telephone Encounter (Signed)
Patient was called and Dr. Patty Sermons recommendation was read to him. Patient will pick up prescription tomorrow.

## 2014-06-21 NOTE — Telephone Encounter (Signed)
Called [facility],Dr.Sartar's office. I spoke with nurse. She states that there has been a change in the plan of treatment. Patient is to return to [facility] in 1 month for repeat Colonoscopy , to stretch the stricture of distal Ileum. Will give Entocort as it is Ileum released. Their plan is to get the patient back on Humira , as he was only on it a brief time previously. They want an adequate trial of Humira. At this time Thompson Grayer is not the plan of treatment. Nurse plans to call [facility] today to arrange Colonoscopy for 1 month.

## 2014-06-21 NOTE — Telephone Encounter (Signed)
I called David [name] about his Oxycodone. I ask how many pills he had left since he had been in the hospital at [facility]. He states that he has 5 pills left. He takes 3 a day. States that he will take 3 today , and 2 tomorrow.

## 2014-06-28 ENCOUNTER — Other Ambulatory Visit (INDEPENDENT_AMBULATORY_CARE_PROVIDER_SITE_OTHER): Payer: Self-pay | Admitting: Internal Medicine

## 2014-06-28 NOTE — Telephone Encounter (Signed)
Per Dr.Rehman may fill with no refills. 

## 2014-07-13 LAB — CBC
HCT: 35.7 % — ABNORMAL LOW (ref 39.0–52.0)
HEMOGLOBIN: 11.4 g/dL — AB (ref 13.0–17.0)
MCH: 26.9 pg (ref 26.0–34.0)
MCHC: 31.9 g/dL (ref 30.0–36.0)
MCV: 84.2 fL (ref 78.0–100.0)
Platelets: 124 10*3/uL — ABNORMAL LOW (ref 150–400)
RBC: 4.24 MIL/uL (ref 4.22–5.81)
RDW: 19.2 % — ABNORMAL HIGH (ref 11.5–15.5)
WBC: 8.3 10*3/uL (ref 4.0–10.5)

## 2014-07-14 HISTORY — PX: COLONOSCOPY: SHX174

## 2014-07-14 LAB — BASIC METABOLIC PANEL
BUN: 12 mg/dL (ref 6–23)
CALCIUM: 8.3 mg/dL — AB (ref 8.4–10.5)
CO2: 28 mEq/L (ref 19–32)
Chloride: 106 mEq/L (ref 96–112)
Creat: 0.67 mg/dL (ref 0.50–1.35)
GLUCOSE: 85 mg/dL (ref 70–99)
Potassium: 3.8 mEq/L (ref 3.5–5.3)
Sodium: 143 mEq/L (ref 135–145)

## 2014-07-20 ENCOUNTER — Other Ambulatory Visit (INDEPENDENT_AMBULATORY_CARE_PROVIDER_SITE_OTHER): Payer: Self-pay | Admitting: *Deleted

## 2014-07-20 NOTE — Telephone Encounter (Signed)
Patient states he will need a written prescription for his pain medication. He states that he will pick it up Monday,August 10 ,2015.

## 2014-07-23 MED ORDER — OXYCODONE HCL 20 MG PO TABS: 20.0000 mg | ORAL_TABLET | Freq: Three times a day (TID) | ORAL | Status: DC

## 2014-08-21 ENCOUNTER — Ambulatory Visit (HOSPITAL_COMMUNITY)
Admission: RE | Admit: 2014-08-21 | Discharge: 2014-08-21 | Disposition: A | Discharge status: Home / Self Care | Payer: Medicare Other | Source: Ambulatory Visit | Attending: Internal Medicine | Admitting: Internal Medicine

## 2014-08-21 ENCOUNTER — Encounter (INDEPENDENT_AMBULATORY_CARE_PROVIDER_SITE_OTHER): Payer: Self-pay | Admitting: Internal Medicine

## 2014-08-21 ENCOUNTER — Ambulatory Visit (INDEPENDENT_AMBULATORY_CARE_PROVIDER_SITE_OTHER): Payer: Medicare Other | Admitting: Internal Medicine

## 2014-08-21 VITALS — BP 102/68 | HR 72 | Temp 97.7°F | Resp 18 | Ht 71.0 in | Wt 193.4 lb

## 2014-08-21 DIAGNOSIS — R1031 Right lower quadrant pain: Secondary | ICD-10-CM

## 2014-08-21 DIAGNOSIS — F17200 Nicotine dependence, unspecified, uncomplicated: Secondary | ICD-10-CM | POA: Diagnosis not present

## 2014-08-21 DIAGNOSIS — K2100 Gastro-esophageal reflux disease with esophagitis, without bleeding: Secondary | ICD-10-CM

## 2014-08-21 DIAGNOSIS — K5090 Crohn's disease, unspecified, without complications: Secondary | ICD-10-CM

## 2014-08-21 DIAGNOSIS — R0689 Other abnormalities of breathing: Secondary | ICD-10-CM

## 2014-08-21 DIAGNOSIS — R112 Nausea with vomiting, unspecified: Secondary | ICD-10-CM

## 2014-08-21 DIAGNOSIS — R0989 Other specified symptoms and signs involving the circulatory and respiratory systems: Secondary | ICD-10-CM

## 2014-08-21 DIAGNOSIS — D509 Iron deficiency anemia, unspecified: Secondary | ICD-10-CM

## 2014-08-21 DIAGNOSIS — R0609 Other forms of dyspnea: Secondary | ICD-10-CM | POA: Diagnosis present

## 2014-08-21 DIAGNOSIS — K50919 Crohn's disease, unspecified, with unspecified complications: Secondary | ICD-10-CM

## 2014-08-21 MED ORDER — PROMETHAZINE HCL 25 MG PO TABS: 25.0000 mg | ORAL_TABLET | Freq: Two times a day (BID) | ORAL | Status: DC | PRN

## 2014-08-21 MED ORDER — PREDNISONE 10 MG PO TABS: ORAL_TABLET | ORAL | Status: DC

## 2014-08-21 MED ORDER — SUCRALFATE 1 G PO TABS: 2.0000 g | ORAL_TABLET | Freq: Every day | ORAL | Status: DC

## 2014-08-21 MED ORDER — OXYCODONE HCL 20 MG PO TABS: 20.0000 mg | ORAL_TABLET | Freq: Three times a day (TID) | ORAL | Status: DC

## 2014-08-21 NOTE — Patient Instructions (Signed)
Physician will call with results of chest x-ray.

## 2014-08-21 NOTE — Progress Notes (Signed)
Presenting complaint;  Abdominal pain nausea and vomiting. Patient needs multiple refills.  Subjective:  Patient is 38 year old Caucasian male who presents for scheduled visit. He has multiple problems including difficult to treat Crohn's disease with ileal stricture iron deficiency anemia chronic abdominal pain, cirrhosis and problems with fluid overload. He was last seen 2 months ago. Patient states he had balloon dilation of ileal stricture by Dr. Corliss Parish at [facility] on 08/13/2014. This time he did not have untoward side effects like he had on prior dilation. He felt much better for the next couple of days but now he is having right lower quadrant pain which she describes as spasms and intermittent nausea and vomiting. He also complains of nocturnal regurgitation at times he vomits bile. He has not gained any weight since his last visit. He is having 2-3 soft stools daily. He denies melena rectal or bleeding. He has intermittent low grade fever. Last episode was 3 days ago. He also gives history of coughing spells when he is sleeping and having to wake up to clear his throat. He believes regurgitation is causing to have aspiration pneumonia earlier this year. He states he took metolazone 3 times in last month. Patient has an appointment with Dr. Julieta Gutting of [facility] hepatology later this week. Patient needs a refill on oxycodone, promethazine and prednisone. Patient states he cannot afford Entyvio and his physicians at [facility] or trying to get budesonide   Current Medications: Outpatient Encounter Prescriptions as of 08/21/2014  Medication Sig  . calcium carbonate (OS-CAL) 600 MG TABS Take 600 mg by mouth 2 (two) times daily with a meal.  . carvedilol (COREG) 6.25 MG tablet Take 6.25 mg by mouth 2 (two) times daily.   Marland Kitchen dicyclomine (BENTYL) 10 MG capsule Take 1 capsule (10 mg total) by mouth 3 (three) times daily as needed (stomach cramps).  . ferrous sulfate 325 (65 FE) MG tablet Take 325 mg  by mouth 2 (two) times daily.  . fish oil-omega-3 fatty acids 1000 MG capsule Take 1 g by mouth 3 (three) times daily.  . furosemide (LASIX) 40 MG tablet Take 2 tablets (80 mg total) by mouth daily.  . metoCLOPramide (REGLAN) 5 MG tablet Take 1 tablet (5 mg total) by mouth 3 (three) times daily as needed for nausea.  . metolazone (ZAROXOLYN) 5 MG tablet Take 1 tablet (5 mg total) by mouth daily as needed.  . Multiple Vitamins-Minerals (MULTIVITAMINS THER. W/MINERALS) TABS Take 1 tablet by mouth 2 (two) times daily.   . Oxycodone HCl 20 MG TABS Take 1 tablet (20 mg total) by mouth 3 (three) times daily.  . pantoprazole (PROTONIX) 40 MG tablet Take 1 tablet (40 mg total) by mouth 2 (two) times daily.  . potassium chloride (K-DUR,KLOR-CON) 10 MEQ tablet Take 20 mEq by mouth daily.  . predniSONE (DELTASONE) 10 MG tablet TAKE 3 TABLETS BY MOUTH DAILY  . Probiotic Product (ALIGN) 4 MG CAPS Take 4 mg by mouth daily.   . promethazine (PHENERGAN) 25 MG tablet Take 25 mg by mouth every 6 (six) hours as needed. nausea  . spironolactone (ALDACTONE) 50 MG tablet Take 2 tablets (100 mg total) by mouth daily.  . ursodiol (ACTIGALL) 300 MG capsule Take 600 mg by mouth 2 (two) times daily.     Objective: Blood pressure 102/68, pulse 72, temperature 97.7 F (36.5 C), temperature source Oral, resp. rate 18, height  (1.803 m), weight 193 lb 6.4 oz (87.726 kg). Patient is alert and does  not appear to be in acute distress. Conjunctiva is pink. Sclera is nonicteric Oropharyngeal mucosa is normal. No neck masses or thyromegaly noted. Cardiac exam with regular rhythm normal S1 and S2. No murmur or gallop noted. Breath sounds are absent at left base. Abdomen is protuberant with a wide midline scar. Bowel sounds are normal. Abdomen is soft with mild to moderate tenderness at RUQ but no mass palpated. Liver is 5-6 cm below RCM it's firm and minimally tender. Spleen is not palpable.  He has mild clubbing and  trace edema around ankles.   Labs/studies Results: Lab data from 07/13/2014  WBC 8.3, H&H 11.4 and 35.7 and platelet count 124K   serum sodium 143, potassium 3.8, right 106, CO2 28, BUN 12 creatinine 0.67 Glucose 85.  Assessment:  #1. Crohn's disease. He remains with symptoms primarily secondary to small bowel stricture. He has undergone 2 balloon dilations at [facility] most recently 8 days ago but without symptomatic improvement despite on modest dose of prednisone. Thompson Grayer was recommended but he cannot afford this medication as his co-pay is at least $3000. If abdominal pain persists he needs to contact Dr. Ephriam Jenkins office while he is at [facility] later this week. #2. Iron deficiency anemia. Last H&H was much better than one 2 months earlier when hemoglobin was 9.4 g. Iron deficiency anemia felt to be secondary to chronic GI blood loss and impaired iron absorption. #3. Fluid overload. He is doing much better with low salt diet and diuretic therapy and occasional metolazone use. #4. Chronic abdominal pain control with oxycodone. #5. Cirrhosis primarily secondary to methotrexate. Patient is potentially a candidate for liver transplant and is being followed at [facility] and he has an appointment in 2 days. #6. GERD. He he is having bilious nocturnal regurgitation. #7. Diminished breath sounds at left lung base suspicious for pleural effusion.  Plan: Sucralfate 2 g by mouth each bedtime. New prescription given for oxycodone 30 day supply. New prescription given for prednisone 10 mg 100 tablets. New prescription given for promethazine. PA and left lateral chest film to rule out left pleural effusion. Will delay blood work until office visit with Dr. Julieta Gutting. He will need CBC and metabolic 7 unless he has blood work at [facility]. Office visit in 3 months.

## 2014-09-19 ENCOUNTER — Encounter (INDEPENDENT_AMBULATORY_CARE_PROVIDER_SITE_OTHER): Payer: Self-pay | Admitting: Internal Medicine

## 2014-09-19 DIAGNOSIS — R1031 Right lower quadrant pain: Secondary | ICD-10-CM

## 2014-09-20 ENCOUNTER — Other Ambulatory Visit (INDEPENDENT_AMBULATORY_CARE_PROVIDER_SITE_OTHER): Payer: Self-pay | Admitting: *Deleted

## 2014-09-20 DIAGNOSIS — R1031 Right lower quadrant pain: Secondary | ICD-10-CM

## 2014-09-20 MED ORDER — OXYCODONE HCL 20 MG PO TABS: 20.0000 mg | ORAL_TABLET | Freq: Three times a day (TID) | ORAL | Status: DC

## 2014-09-20 NOTE — Telephone Encounter (Signed)
Patient called and needs written refill on pain medication. Last filled 08/21/14.

## 2014-09-21 MED ORDER — OXYCODONE HCL 20 MG PO TABS: 20.0000 mg | ORAL_TABLET | Freq: Three times a day (TID) | ORAL | Status: DC

## 2014-09-21 NOTE — Telephone Encounter (Signed)
Old prescription torn and placed in  Shred box. It was not signed, so I printed a new Rx and signed it. Babette Relicammy Todd witnessed.

## 2014-09-25 ENCOUNTER — Other Ambulatory Visit (INDEPENDENT_AMBULATORY_CARE_PROVIDER_SITE_OTHER): Payer: Self-pay | Admitting: Internal Medicine

## 2014-09-25 DIAGNOSIS — K7469 Other cirrhosis of liver: Secondary | ICD-10-CM

## 2014-09-25 DIAGNOSIS — Z7960 Long term (current) use of unspecified immunomodulators and immunosuppressants: Secondary | ICD-10-CM

## 2014-09-25 DIAGNOSIS — Z79899 Other long term (current) drug therapy: Secondary | ICD-10-CM

## 2014-09-25 DIAGNOSIS — K5090 Crohn's disease, unspecified, without complications: Secondary | ICD-10-CM

## 2014-09-26 ENCOUNTER — Encounter: Payer: Self-pay | Admitting: Family Medicine

## 2014-09-26 ENCOUNTER — Ambulatory Visit (INDEPENDENT_AMBULATORY_CARE_PROVIDER_SITE_OTHER): Payer: Medicare Other | Admitting: Family Medicine

## 2014-09-26 VITALS — BP 122/80 | Temp 99.5°F | Ht 71.0 in | Wt 211.5 lb

## 2014-09-26 DIAGNOSIS — M545 Low back pain: Secondary | ICD-10-CM

## 2014-09-26 LAB — POCT URINALYSIS DIPSTICK
Blood, UA: 250
Protein, UA: 30
Spec Grav, UA: 1.015
pH, UA: 5

## 2014-09-26 MED ORDER — LEVOFLOXACIN 500 MG PO TABS: 500.0000 mg | ORAL_TABLET | Freq: Every day | ORAL | Status: AC

## 2014-09-26 NOTE — Progress Notes (Signed)
   Subjective:    Patient ID: David [name], male    DOB: [birth year], 38 y.o.   MRN: [id number]  Pneumonia He complains of cough, difficulty breathing, shortness of breath and wheezing. This is a new problem. The current episode started in the past 7 days. The problem occurs intermittently. The problem has been unchanged. Associated symptoms include a fever. His symptoms are aggravated by nothing. His symptoms are alleviated by nothing. He reports no improvement on treatment. There are no known risk factors for lung disease.   coughin off and on for about a month, aspirates a t last three or four times per wk  Had a blockage in the small intestine and had some reflux assox with that  A lot of   Right sided back pain  Hx of bilat kidney stones  incr flank pain and now dark transiently  Uses the pain med 20 mg tid oxycodone  Prod cough phlegm  Results for orders placed in visit on 09/26/14  POCT URINALYSIS DIPSTICK      Result Value Ref Range   Color, UA       Clarity, UA       Glucose, UA       Bilirubin, UA ++     Ketones, UA       Spec Grav, UA 1.015     Blood, UA 250     pH, UA 5.0     Protein, UA 30     Urobilinogen, UA       Nitrite, UA       Leukocytes, UA small (1+)        Patient states that he has back pain and his urine is dark. He would like this checked also.   Review of Systems  Constitutional: Positive for fever.  Respiratory: Positive for cough, shortness of breath and wheezing.        Objective:   Physical Exam        Assessment & Plan:

## 2014-10-04 ENCOUNTER — Encounter (INDEPENDENT_AMBULATORY_CARE_PROVIDER_SITE_OTHER): Payer: Self-pay | Admitting: Internal Medicine

## 2014-10-04 DIAGNOSIS — K7469 Other cirrhosis of liver: Secondary | ICD-10-CM

## 2014-10-04 DIAGNOSIS — K50919 Crohn's disease, unspecified, with unspecified complications: Secondary | ICD-10-CM

## 2014-10-04 DIAGNOSIS — Z79899 Other long term (current) drug therapy: Secondary | ICD-10-CM

## 2014-10-04 DIAGNOSIS — Z79891 Long term (current) use of opiate analgesic: Secondary | ICD-10-CM

## 2014-10-04 NOTE — Progress Notes (Signed)
This encounter was created in error - please disregard.

## 2014-10-10 ENCOUNTER — Ambulatory Visit (HOSPITAL_COMMUNITY)
Admission: RE | Admit: 2014-10-10 | Discharge: 2014-10-10 | Disposition: A | Discharge status: Home / Self Care | Payer: Medicare Other | Source: Ambulatory Visit | Attending: Internal Medicine | Admitting: Internal Medicine

## 2014-10-10 ENCOUNTER — Ambulatory Visit (HOSPITAL_COMMUNITY): Payer: Medicare Other

## 2014-10-10 DIAGNOSIS — K7469 Other cirrhosis of liver: Secondary | ICD-10-CM | POA: Diagnosis not present

## 2014-10-10 DIAGNOSIS — K5090 Crohn's disease, unspecified, without complications: Secondary | ICD-10-CM | POA: Diagnosis present

## 2014-10-10 DIAGNOSIS — Z7960 Long term (current) use of unspecified immunomodulators and immunosuppressants: Secondary | ICD-10-CM

## 2014-10-10 DIAGNOSIS — Z79899 Other long term (current) drug therapy: Secondary | ICD-10-CM | POA: Insufficient documentation

## 2014-10-15 ENCOUNTER — Ambulatory Visit (HOSPITAL_COMMUNITY)
Admission: RE | Admit: 2014-10-15 | Discharge: 2014-10-15 | Disposition: A | Discharge status: Home / Self Care | Payer: Medicare Other | Source: Ambulatory Visit | Attending: Internal Medicine | Admitting: Internal Medicine

## 2014-10-15 DIAGNOSIS — K7460 Unspecified cirrhosis of liver: Secondary | ICD-10-CM | POA: Insufficient documentation

## 2014-10-15 DIAGNOSIS — K7469 Other cirrhosis of liver: Secondary | ICD-10-CM

## 2014-10-15 DIAGNOSIS — K5090 Crohn's disease, unspecified, without complications: Secondary | ICD-10-CM

## 2014-10-15 LAB — POCT I-STAT CREATININE: Creatinine, Ser: 0.7 mg/dL (ref 0.50–1.35)

## 2014-10-15 MED ORDER — GADOBENATE DIMEGLUMINE 529 MG/ML IV SOLN
18.0000 mL | Freq: Once | INTRAVENOUS | Status: AC | PRN
  Administered 2014-10-15: 18 mL via INTRAVENOUS

## 2014-10-18 ENCOUNTER — Other Ambulatory Visit (INDEPENDENT_AMBULATORY_CARE_PROVIDER_SITE_OTHER): Payer: Self-pay | Admitting: *Deleted

## 2014-10-18 DIAGNOSIS — R1031 Right lower quadrant pain: Secondary | ICD-10-CM

## 2014-10-18 NOTE — Telephone Encounter (Signed)
Patient request written prescription for Oxycodone.Last written on 09/21/14

## 2014-10-19 MED ORDER — OXYCODONE HCL 20 MG PO TABS: 20.0000 mg | ORAL_TABLET | Freq: Three times a day (TID) | ORAL | Status: DC

## 2014-10-29 ENCOUNTER — Telehealth (INDEPENDENT_AMBULATORY_CARE_PROVIDER_SITE_OTHER): Payer: Self-pay | Admitting: *Deleted

## 2014-10-30 NOTE — Telephone Encounter (Signed)
Chart was opened to review medication.

## 2014-11-05 HISTORY — PX: EXPLORATORY LAPAROTOMY: SUR591

## 2014-11-19 ENCOUNTER — Telehealth (INDEPENDENT_AMBULATORY_CARE_PROVIDER_SITE_OTHER): Payer: Self-pay | Admitting: *Deleted

## 2014-11-19 HISTORY — PX: GASTROSTOMY TUBE PLACEMENT: SHX655

## 2014-11-19 NOTE — Telephone Encounter (Signed)
All notes have been faxed to Dr Marval Regal [phone])

## 2014-11-19 NOTE — Telephone Encounter (Signed)
Dr.Rehman ask that you send a consult to Dr.Kadiyala Ravindra (Ravindia) @ [facility]. The Consult is for a Small Bowel and Liver Transplant.

## 2014-11-20 ENCOUNTER — Ambulatory Visit (INDEPENDENT_AMBULATORY_CARE_PROVIDER_SITE_OTHER): Payer: Medicare Other | Admitting: Internal Medicine

## 2014-11-23 ENCOUNTER — Other Ambulatory Visit (INDEPENDENT_AMBULATORY_CARE_PROVIDER_SITE_OTHER): Payer: Self-pay | Admitting: Internal Medicine

## 2014-11-23 ENCOUNTER — Other Ambulatory Visit (INDEPENDENT_AMBULATORY_CARE_PROVIDER_SITE_OTHER): Payer: Self-pay | Admitting: *Deleted

## 2014-11-23 DIAGNOSIS — R1031 Right lower quadrant pain: Secondary | ICD-10-CM

## 2014-11-23 MED ORDER — OXYCODONE HCL 5 MG/5ML PO SOLN: 20.0000 mg | Freq: Three times a day (TID) | ORAL | Status: DC

## 2014-11-23 MED ORDER — OXYCODONE HCL 20 MG/ML PO CONC: 20.0000 mg | Freq: Four times a day (QID) | ORAL | Status: DC

## 2014-11-23 NOTE — Telephone Encounter (Signed)
Patient called , he has been discharged from [facility]. He was told by the doctor there to have the physician handling his pain medication to write for liquid pain medication,as he cannot absorb the pill form. They made the change while in the hospital. Pateint says that he will need it today if possible. Last pain medication that was written by Dr.Rehman was 10/19/14. Patient has a 1 week follow up post hospital discharge with [place] 11/27/14 @ 12:30 pm.

## 2014-11-23 NOTE — Telephone Encounter (Signed)
Medication changed to oxycodone elixir at 20 mg 4 times a day

## 2014-11-27 ENCOUNTER — Encounter (INDEPENDENT_AMBULATORY_CARE_PROVIDER_SITE_OTHER): Payer: Self-pay | Admitting: Internal Medicine

## 2014-11-27 ENCOUNTER — Ambulatory Visit (INDEPENDENT_AMBULATORY_CARE_PROVIDER_SITE_OTHER): Payer: Medicare Other | Admitting: Internal Medicine

## 2014-11-27 VITALS — BP 94/66 | HR 68 | Temp 97.8°F | Resp 16 | Ht 71.0 in | Wt 163.3 lb

## 2014-11-27 DIAGNOSIS — R112 Nausea with vomiting, unspecified: Secondary | ICD-10-CM

## 2014-11-27 DIAGNOSIS — K2100 Gastro-esophageal reflux disease with esophagitis, without bleeding: Secondary | ICD-10-CM

## 2014-11-27 DIAGNOSIS — D509 Iron deficiency anemia, unspecified: Secondary | ICD-10-CM

## 2014-11-27 DIAGNOSIS — R634 Abnormal weight loss: Secondary | ICD-10-CM

## 2014-11-27 DIAGNOSIS — R1114 Bilious vomiting: Secondary | ICD-10-CM

## 2014-11-27 DIAGNOSIS — K7460 Unspecified cirrhosis of liver: Secondary | ICD-10-CM

## 2014-11-27 DIAGNOSIS — K50912 Crohn's disease, unspecified, with intestinal obstruction: Secondary | ICD-10-CM

## 2014-11-27 DIAGNOSIS — M810 Age-related osteoporosis without current pathological fracture: Secondary | ICD-10-CM

## 2014-11-27 DIAGNOSIS — R600 Localized edema: Secondary | ICD-10-CM

## 2014-11-27 MED ORDER — PROMETHAZINE HCL 25 MG PO TABS: 25.0000 mg | ORAL_TABLET | Freq: Two times a day (BID) | ORAL | Status: AC | PRN

## 2014-11-27 MED ORDER — FUROSEMIDE 40 MG PO TABS: 40.0000 mg | ORAL_TABLET | Freq: Every day | ORAL | Status: DC | PRN

## 2014-11-27 MED ORDER — FLINTSTONES COMPLETE 60 MG PO CHEW: 1.0000 | CHEWABLE_TABLET | Freq: Two times a day (BID) | ORAL | Status: DC

## 2014-11-27 MED ORDER — CALCIUM CARBONATE-VITAMIN D 500-200 MG-UNIT PO TABS: 1.0000 | ORAL_TABLET | Freq: Two times a day (BID) | ORAL | Status: DC

## 2014-11-27 NOTE — Progress Notes (Signed)
Presenting complaint;  Follow-up for  Crohn's disease with small bowel stricture, cirrhosis and fluid overload.  Subjective:  David [name] is 38 year old Caucasian male with history of Crohn's disease as well as cirrhosis iron deficiency anemia and fluid overload is here for scheduled visit accompanied by his mother. I last saw him on 08/21/2014 when he weight 193 pounds. Last month he was hospitalized at [facility] for small bowel obstruction and did not respond to medical therapy. Patient was not pleased and left AMA and went to [facility] where she was admitted. He was seen by physicians from GI division as well as Dr. Durenda Hurt of colorectal surgery. They felt patient would benefit from surgical intervention since ileal stricture did not stay open with 2 prior dilations at [facility]. He underwent laparotomy EF frozen abdomen and therefore surgery was not successful. Dr. Byrd Hesselbach was kind enough to call me and let me know. During that admission patient also had aspiration pneumonia with bacteremia and 20 with antibiotics. He was readmitted week before last and he had gastrostomy tube placed on 11/19/2014 primarily for decompression. This procedure was performed by interventional radiologist. Patient has been on pured diet. He has nausea chronic abdominal pain and he has had 2 episodes of vomiting in the last 5 days. He has lost 30 pounds since I saw him on 08/21/2014. He is using Lasix on an as-needed basis. Pain medication was changed to liquid oxycodone last week. He is having bowel movement every other day. While he passes it bits and pieces of stool. He denies melena or rectal bleeding.   Current Medications: Outpatient Encounter Prescriptions as of 11/27/2014  Medication Sig  . calcium carbonate (OS-CAL) 600 MG TABS Take 600 mg by mouth 2 (two) times daily with a meal.  . ciprofloxacin (CIPRO) 750 MG tablet Take 750 mg by mouth 2 (two) times daily.   Marland Kitchen dicyclomine (BENTYL) 10 MG  capsule Take 1 capsule (10 mg total) by mouth 3 (three) times daily as needed (stomach cramps).  . ferrous sulfate 325 (65 FE) MG tablet Take 325 mg by mouth 2 (two) times daily.  . fish oil-omega-3 fatty acids 1000 MG capsule Take 1 g by mouth 3 (three) times daily.  . furosemide (LASIX) 40 MG tablet Take 2 tablets (80 mg total) by mouth daily. (Patient taking differently: Take 80 mg by mouth daily. Patient states that he takes 2 by mouth every other day)  . metoCLOPramide (REGLAN) 5 MG tablet Take 1 tablet (5 mg total) by mouth 3 (three) times daily as needed for nausea.  . metolazone (ZAROXOLYN) 5 MG tablet Take 1 tablet (5 mg total) by mouth daily as needed.  . Multiple Vitamins-Minerals (MULTIVITAMINS THER. W/MINERALS) TABS Take 1 tablet by mouth 2 (two) times daily.   . OxyCODONE HCl 20 MG/ML CONC Take 20 mg by mouth 4 (four) times daily.  . pantoprazole (PROTONIX) 40 MG tablet Take 1 tablet (40 mg total) by mouth 2 (two) times daily.  . polyethylene glycol (MIRALAX / GLYCOLAX) packet Take 17 g by mouth as needed.   . potassium chloride (K-DUR,KLOR-CON) 10 MEQ tablet Take 20 mEq by mouth daily.  . predniSONE (DELTASONE) 10 MG tablet Take 40 mg by mouth daily.   . Probiotic Product (ALIGN) 4 MG CAPS Take 4 mg by mouth daily.   . promethazine (PHENERGAN) 25 MG tablet Take 1 tablet (25 mg total) by mouth 2 (two) times daily as needed. nausea  . spironolactone (ALDACTONE) 50 MG tablet  Take 2 tablets (100 mg total) by mouth daily.  . sucralfate (CARAFATE) 1 G tablet Take 2 tablets (2 g total) by mouth at bedtime. (Patient taking differently: Take 1 g by mouth. Patient states that he takes 1 by mouth at each meal.)  . ursodiol (ACTIGALL) 300 MG capsule Take 300 mg by mouth 2 (two) times daily.   . carvedilol (COREG) 6.25 MG tablet Take 6.25 mg by mouth 2 (two) times daily.   . [DISCONTINUED] budesonide (ENTOCORT EC) 3 MG 24 hr capsule Take 9 mg by mouth.     Objective: Blood pressure 94/66,  pulse 68, temperature 97.8 F (36.6 C), temperature source Oral, resp. rate 16, height 5\' 11"  (1.803 m), weight 163 lb 4.8 oz (74.072 kg). Patient is alert and appears chronically ill. Conjunctiva is pink. Sclera is nonicteric Oropharyngeal mucosa is normal. No neck masses or thyromegaly noted. Cardiac exam with regular rhythm normal S1 and S2. No murmur or gallop noted. Lungs are clear to auscultation. Abdomen is full. G-tube is in place with entry close to midline and epigastrium. Bowel sounds are normal. On palpation abdomen is totally feeling with mild generalized tenderness. Liver edge is difficult to feel. No LE edema or clubbing noted.  Labs/studies Results:   [facility] reviewed using care everywhere.   Assessment:  #1. Crohn's disease involving small intestine and perianal disease with complicated course and recurrent small bowel obstruction due to long stricture. Endoscopic balloon dilation 2 provided transient relief. 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 Patient has gastrostomy tube primarily for decompression and he has acute symptoms. 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 [facility]. However [facility] does not have program for combined liver and small bowel transplant. #3.  Lower extremity edema. The edema has completely resolved. He is to be very careful with diuretic therapy as these had risk for dehydration. #4.  Weight loss. #5. Osteoporosis. Bone density results reviewed with patient. He will need parenteral therapy as he is not a candidate for oral medications.  Plan:  Decrease prednisone to 30 mg by mouth daily New prescription given for Phenergan 25 mg by mouth twice a day when necessary. Patient advised to stay on pured diet and eat multiple small meals. Discontinue sucralfate, Ferrous sulfate, Urso and Spiriva lactone. Os-Cal 600 chewable 1 tablet twice a  day. [place] complete 1 tablet twice a day. Furosemide 40 mg by mouth daily when necessary. KCl 20 mEq by mouth daily when necessary. Daily weights. CBC comprehensive chemistry panel and serum magnesium in the next 5-7 days. Will recommend treatment for osteoporosis at the time of next office visit. Office visit in 1 month. [facility] bowel and liver transplant. Patient has an appointment to see Dr. Marval Regalavindra on 12/21/2014.

## 2014-11-27 NOTE — Patient Instructions (Addendum)
Pure diet; multiple small meals. Daily weight record. Blood work either this Friday or next week Monday. Physician will call with results of blood test.

## 2014-12-03 ENCOUNTER — Ambulatory Visit: Payer: Medicare Other | Admitting: Family Medicine

## 2014-12-05 LAB — COMPREHENSIVE METABOLIC PANEL
ALK PHOS: 121 U/L — AB (ref 39–117)
ALT: 29 U/L (ref 0–53)
AST: 24 U/L (ref 0–37)
Albumin: 3.4 g/dL — ABNORMAL LOW (ref 3.5–5.2)
BILIRUBIN TOTAL: 0.8 mg/dL (ref 0.2–1.2)
BUN: 46 mg/dL — ABNORMAL HIGH (ref 6–23)
CO2: 27 mEq/L (ref 19–32)
Calcium: 9.4 mg/dL (ref 8.4–10.5)
Chloride: 82 mEq/L — ABNORMAL LOW (ref 96–112)
Creat: 1.62 mg/dL — ABNORMAL HIGH (ref 0.50–1.35)
GLUCOSE: 123 mg/dL — AB (ref 70–99)
Potassium: 4.7 mEq/L (ref 3.5–5.3)
SODIUM: 125 meq/L — AB (ref 135–145)
Total Protein: 5.6 g/dL — ABNORMAL LOW (ref 6.0–8.3)

## 2014-12-05 LAB — CBC
HCT: 32.1 % — ABNORMAL LOW (ref 39.0–52.0)
HEMOGLOBIN: 9.9 g/dL — AB (ref 13.0–17.0)
MCH: 23.8 pg — ABNORMAL LOW (ref 26.0–34.0)
MCHC: 30.8 g/dL (ref 30.0–36.0)
MCV: 77.2 fL — ABNORMAL LOW (ref 78.0–100.0)
MPV: 9.6 fL (ref 9.4–12.4)
Platelets: 249 10*3/uL (ref 150–400)
RBC: 4.16 MIL/uL — AB (ref 4.22–5.81)
RDW: 19.6 % — ABNORMAL HIGH (ref 11.5–15.5)
WBC: 28.3 10*3/uL — ABNORMAL HIGH (ref 4.0–10.5)

## 2014-12-05 LAB — MAGNESIUM: Magnesium: 2.3 mg/dL (ref 1.5–2.5)

## 2014-12-09 ENCOUNTER — Inpatient Hospital Stay (HOSPITAL_COMMUNITY)
Admission: EM | Admit: 2014-12-09 | Discharge: 2014-12-14 | DRG: 393 | Disposition: A | Discharge status: Home / Self Care | Payer: Medicare Other | Attending: Family Medicine | Admitting: Family Medicine

## 2014-12-09 ENCOUNTER — Emergency Department (HOSPITAL_COMMUNITY): Payer: Medicare Other

## 2014-12-09 ENCOUNTER — Encounter (HOSPITAL_COMMUNITY): Payer: Self-pay | Admitting: Emergency Medicine

## 2014-12-09 DIAGNOSIS — N179 Acute kidney failure, unspecified: Secondary | ICD-10-CM | POA: Diagnosis not present

## 2014-12-09 DIAGNOSIS — K7460 Unspecified cirrhosis of liver: Secondary | ICD-10-CM | POA: Diagnosis present

## 2014-12-09 DIAGNOSIS — R627 Adult failure to thrive: Secondary | ICD-10-CM | POA: Diagnosis not present

## 2014-12-09 DIAGNOSIS — K50819 Crohn's disease of both small and large intestine with unspecified complications: Secondary | ICD-10-CM | POA: Insufficient documentation

## 2014-12-09 DIAGNOSIS — F329 Major depressive disorder, single episode, unspecified: Secondary | ICD-10-CM | POA: Diagnosis present

## 2014-12-09 DIAGNOSIS — Z885 Allergy status to narcotic agent status: Secondary | ICD-10-CM | POA: Diagnosis not present

## 2014-12-09 DIAGNOSIS — D72829 Elevated white blood cell count, unspecified: Secondary | ICD-10-CM | POA: Diagnosis present

## 2014-12-09 DIAGNOSIS — E43 Unspecified severe protein-calorie malnutrition: Secondary | ICD-10-CM | POA: Insufficient documentation

## 2014-12-09 DIAGNOSIS — F419 Anxiety disorder, unspecified: Secondary | ICD-10-CM | POA: Diagnosis present

## 2014-12-09 DIAGNOSIS — M810 Age-related osteoporosis without current pathological fracture: Secondary | ICD-10-CM | POA: Diagnosis present

## 2014-12-09 DIAGNOSIS — Z87442 Personal history of urinary calculi: Secondary | ICD-10-CM

## 2014-12-09 DIAGNOSIS — K5090 Crohn's disease, unspecified, without complications: Secondary | ICD-10-CM | POA: Insufficient documentation

## 2014-12-09 DIAGNOSIS — Z8711 Personal history of peptic ulcer disease: Secondary | ICD-10-CM | POA: Diagnosis not present

## 2014-12-09 DIAGNOSIS — Y838 Other surgical procedures as the cause of abnormal reaction of the patient, or of later complication, without mention of misadventure at the time of the procedure: Secondary | ICD-10-CM | POA: Diagnosis present

## 2014-12-09 DIAGNOSIS — E860 Dehydration: Secondary | ICD-10-CM | POA: Diagnosis present

## 2014-12-09 DIAGNOSIS — Z5181 Encounter for therapeutic drug level monitoring: Secondary | ICD-10-CM | POA: Diagnosis not present

## 2014-12-09 DIAGNOSIS — F17210 Nicotine dependence, cigarettes, uncomplicated: Secondary | ICD-10-CM | POA: Diagnosis present

## 2014-12-09 DIAGNOSIS — E8770 Fluid overload, unspecified: Secondary | ICD-10-CM | POA: Diagnosis not present

## 2014-12-09 DIAGNOSIS — Z888 Allergy status to other drugs, medicaments and biological substances status: Secondary | ICD-10-CM | POA: Diagnosis not present

## 2014-12-09 DIAGNOSIS — K9423 Gastrostomy malfunction: Secondary | ICD-10-CM | POA: Diagnosis present

## 2014-12-09 DIAGNOSIS — K50913 Crohn's disease, unspecified, with fistula: Secondary | ICD-10-CM | POA: Diagnosis present

## 2014-12-09 DIAGNOSIS — D638 Anemia in other chronic diseases classified elsewhere: Secondary | ICD-10-CM | POA: Diagnosis present

## 2014-12-09 DIAGNOSIS — E871 Hypo-osmolality and hyponatremia: Secondary | ICD-10-CM | POA: Diagnosis present

## 2014-12-09 DIAGNOSIS — K769 Liver disease, unspecified: Secondary | ICD-10-CM | POA: Diagnosis not present

## 2014-12-09 DIAGNOSIS — J189 Pneumonia, unspecified organism: Secondary | ICD-10-CM | POA: Diagnosis present

## 2014-12-09 DIAGNOSIS — R531 Weakness: Secondary | ICD-10-CM

## 2014-12-09 DIAGNOSIS — K759 Inflammatory liver disease, unspecified: Secondary | ICD-10-CM | POA: Diagnosis present

## 2014-12-09 DIAGNOSIS — D62 Acute posthemorrhagic anemia: Secondary | ICD-10-CM | POA: Diagnosis present

## 2014-12-09 DIAGNOSIS — K50918 Crohn's disease, unspecified, with other complication: Secondary | ICD-10-CM | POA: Diagnosis not present

## 2014-12-09 DIAGNOSIS — Z7952 Long term (current) use of systemic steroids: Secondary | ICD-10-CM | POA: Diagnosis not present

## 2014-12-09 DIAGNOSIS — K7581 Nonalcoholic steatohepatitis (NASH): Secondary | ICD-10-CM | POA: Diagnosis present

## 2014-12-09 DIAGNOSIS — K603 Anal fistula: Secondary | ICD-10-CM | POA: Diagnosis not present

## 2014-12-09 DIAGNOSIS — Z4801 Encounter for change or removal of surgical wound dressing: Secondary | ICD-10-CM | POA: Diagnosis not present

## 2014-12-09 DIAGNOSIS — D509 Iron deficiency anemia, unspecified: Secondary | ICD-10-CM | POA: Diagnosis present

## 2014-12-09 DIAGNOSIS — K760 Fatty (change of) liver, not elsewhere classified: Secondary | ICD-10-CM | POA: Diagnosis present

## 2014-12-09 DIAGNOSIS — Z9049 Acquired absence of other specified parts of digestive tract: Secondary | ICD-10-CM | POA: Diagnosis present

## 2014-12-09 DIAGNOSIS — K3184 Gastroparesis: Secondary | ICD-10-CM | POA: Diagnosis not present

## 2014-12-09 DIAGNOSIS — K50813 Crohn's disease of both small and large intestine with fistula: Secondary | ICD-10-CM | POA: Diagnosis not present

## 2014-12-09 DIAGNOSIS — K219 Gastro-esophageal reflux disease without esophagitis: Secondary | ICD-10-CM | POA: Diagnosis not present

## 2014-12-09 DIAGNOSIS — K739 Chronic hepatitis, unspecified: Secondary | ICD-10-CM | POA: Diagnosis not present

## 2014-12-09 DIAGNOSIS — Z6821 Body mass index (BMI) 21.0-21.9, adult: Secondary | ICD-10-CM

## 2014-12-09 DIAGNOSIS — I10 Essential (primary) hypertension: Secondary | ICD-10-CM | POA: Diagnosis not present

## 2014-12-09 DIAGNOSIS — K7469 Other cirrhosis of liver: Secondary | ICD-10-CM | POA: Insufficient documentation

## 2014-12-09 DIAGNOSIS — L89151 Pressure ulcer of sacral region, stage 1: Secondary | ICD-10-CM | POA: Diagnosis not present

## 2014-12-09 DIAGNOSIS — D649 Anemia, unspecified: Secondary | ICD-10-CM | POA: Diagnosis present

## 2014-12-09 DIAGNOSIS — R52 Pain, unspecified: Secondary | ICD-10-CM

## 2014-12-09 DIAGNOSIS — Z452 Encounter for adjustment and management of vascular access device: Secondary | ICD-10-CM | POA: Diagnosis not present

## 2014-12-09 DIAGNOSIS — K7689 Other specified diseases of liver: Secondary | ICD-10-CM | POA: Diagnosis not present

## 2014-12-09 DIAGNOSIS — K269 Duodenal ulcer, unspecified as acute or chronic, without hemorrhage or perforation: Secondary | ICD-10-CM | POA: Diagnosis not present

## 2014-12-09 HISTORY — DX: Unspecified cirrhosis of liver: K74.60

## 2014-12-09 LAB — BASIC METABOLIC PANEL
Anion gap: 8 (ref 5–15)
BUN: 71 mg/dL — ABNORMAL HIGH (ref 6–23)
CO2: 31 mmol/L (ref 19–32)
Calcium: 7.7 mg/dL — ABNORMAL LOW (ref 8.4–10.5)
Chloride: 87 mEq/L — ABNORMAL LOW (ref 96–112)
Creatinine, Ser: 1.42 mg/dL — ABNORMAL HIGH (ref 0.50–1.35)
GFR calc Af Amer: 71 mL/min — ABNORMAL LOW (ref >90)
GFR calc non Af Amer: 61 mL/min — ABNORMAL LOW (ref >90)
Glucose, Bld: 99 mg/dL (ref 70–99)
POTASSIUM: 3.8 mmol/L (ref 3.5–5.1)
SODIUM: 126 mmol/L — AB (ref 135–145)

## 2014-12-09 LAB — COMPREHENSIVE METABOLIC PANEL
ALK PHOS: 105 U/L (ref 39–117)
ALT: 24 U/L (ref 0–53)
ANION GAP: 10 (ref 5–15)
AST: 31 U/L (ref 0–37)
Albumin: 2.6 g/dL — ABNORMAL LOW (ref 3.5–5.2)
BUN: 79 mg/dL — AB (ref 6–23)
CHLORIDE: 81 meq/L — AB (ref 96–112)
CO2: 30 mmol/L (ref 19–32)
Calcium: 7.8 mg/dL — ABNORMAL LOW (ref 8.4–10.5)
Creatinine, Ser: 1.51 mg/dL — ABNORMAL HIGH (ref 0.50–1.35)
GFR, EST AFRICAN AMERICAN: 66 mL/min — AB (ref >90)
GFR, EST NON AFRICAN AMERICAN: 57 mL/min — AB (ref >90)
GLUCOSE: 112 mg/dL — AB (ref 70–99)
Potassium: 3.8 mmol/L (ref 3.5–5.1)
Sodium: 121 mmol/L — ABNORMAL LOW (ref 135–145)
Total Bilirubin: 0.9 mg/dL (ref 0.3–1.2)
Total Protein: 5 g/dL — ABNORMAL LOW (ref 6.0–8.3)

## 2014-12-09 LAB — CBC WITH DIFFERENTIAL/PLATELET
BASOS ABS: 0 10*3/uL (ref 0.0–0.1)
Basophils Relative: 0 % (ref 0–1)
Eosinophils Absolute: 0.2 10*3/uL (ref 0.0–0.7)
Eosinophils Relative: 1 % (ref 0–5)
HEMATOCRIT: 22.4 % — AB (ref 39.0–52.0)
Hemoglobin: 7.1 g/dL — ABNORMAL LOW (ref 13.0–17.0)
LYMPHS ABS: 1.2 10*3/uL (ref 0.7–4.0)
Lymphocytes Relative: 6 % — ABNORMAL LOW (ref 12–46)
MCH: 24.4 pg — ABNORMAL LOW (ref 26.0–34.0)
MCHC: 31.7 g/dL (ref 30.0–36.0)
MCV: 77 fL — AB (ref 78.0–100.0)
MONOS PCT: 7 % (ref 3–12)
Monocytes Absolute: 1.4 10*3/uL — ABNORMAL HIGH (ref 0.1–1.0)
NEUTROS ABS: 16.7 10*3/uL — AB (ref 1.7–7.7)
Neutrophils Relative %: 86 % — ABNORMAL HIGH (ref 43–77)
Platelets: 199 10*3/uL (ref 150–400)
RBC: 2.91 MIL/uL — ABNORMAL LOW (ref 4.22–5.81)
RDW: 17.8 % — AB (ref 11.5–15.5)
WBC: 19.5 10*3/uL — ABNORMAL HIGH (ref 4.0–10.5)

## 2014-12-09 LAB — URINALYSIS, ROUTINE W REFLEX MICROSCOPIC
BILIRUBIN URINE: NEGATIVE
GLUCOSE, UA: NEGATIVE mg/dL
Ketones, ur: NEGATIVE mg/dL
Leukocytes, UA: NEGATIVE
Nitrite: NEGATIVE
PROTEIN: NEGATIVE mg/dL
Specific Gravity, Urine: 1.02 (ref 1.005–1.030)
Urobilinogen, UA: 0.2 mg/dL (ref 0.0–1.0)
pH: 5.5 (ref 5.0–8.0)

## 2014-12-09 LAB — URINE MICROSCOPIC-ADD ON

## 2014-12-09 LAB — LACTIC ACID, PLASMA: LACTIC ACID, VENOUS: 2 mmol/L (ref 0.5–2.2)

## 2014-12-09 LAB — PREPARE RBC (CROSSMATCH)

## 2014-12-09 MED ORDER — HYDROCODONE-ACETAMINOPHEN 7.5-325 MG/15ML PO SOLN: 15.0000 mL | Freq: Once | ORAL | Status: DC

## 2014-12-09 MED ORDER — METOCLOPRAMIDE HCL 10 MG PO TABS: 5.0000 mg | ORAL_TABLET | Freq: Three times a day (TID) | ORAL | Status: DC | PRN

## 2014-12-09 MED ORDER — OXYCODONE HCL 5 MG/5ML PO SOLN: 20.0000 mg | Freq: Four times a day (QID) | ORAL | Status: DC

## 2014-12-09 MED ORDER — ACETAMINOPHEN 325 MG PO TABS
650.0000 mg | ORAL_TABLET | Freq: Once | ORAL | Status: AC
  Administered 2014-12-09: 650 mg via ORAL
  Filled 2014-12-09: qty 2

## 2014-12-09 MED ORDER — ACETAMINOPHEN 325 MG PO TABS: 650.0000 mg | ORAL_TABLET | Freq: Four times a day (QID) | ORAL | Status: DC | PRN

## 2014-12-09 MED ORDER — ENOXAPARIN SODIUM 40 MG/0.4ML ~~LOC~~ SOLN: 40.0000 mg | SUBCUTANEOUS | Status: DC

## 2014-12-09 MED ORDER — SODIUM CHLORIDE 0.9 % IV BOLUS (SEPSIS)
1000.0000 mL | Freq: Once | INTRAVENOUS | Status: AC
  Administered 2014-12-09: 1000 mL via INTRAVENOUS

## 2014-12-09 MED ORDER — PANTOPRAZOLE SODIUM 40 MG PO TBEC
40.0000 mg | DELAYED_RELEASE_TABLET | Freq: Two times a day (BID) | ORAL | Status: DC
  Administered 2014-12-09 – 2014-12-14 (×10): 40 mg via ORAL
  Filled 2014-12-09 (×10): qty 1

## 2014-12-09 MED ORDER — SODIUM CHLORIDE 0.9 % IV SOLN
INTRAVENOUS | Status: DC
  Administered 2014-12-09 – 2014-12-11 (×3): via INTRAVENOUS

## 2014-12-09 MED ORDER — ENSURE COMPLETE PO LIQD
237.0000 mL | Freq: Two times a day (BID) | ORAL | Status: DC
  Administered 2014-12-10 – 2014-12-14 (×6): 237 mL via ORAL

## 2014-12-09 MED ORDER — ONDANSETRON HCL 4 MG/2ML IJ SOLN: 4.0000 mg | Freq: Four times a day (QID) | INTRAMUSCULAR | Status: DC | PRN

## 2014-12-09 MED ORDER — PROMETHAZINE HCL 25 MG/ML IJ SOLN
25.0000 mg | Freq: Four times a day (QID) | INTRAMUSCULAR | Status: DC | PRN
  Administered 2014-12-09 – 2014-12-12 (×2): 25 mg via INTRAVENOUS
  Filled 2014-12-09 (×2): qty 1

## 2014-12-09 MED ORDER — CARVEDILOL 3.125 MG PO TABS
6.2500 mg | ORAL_TABLET | Freq: Two times a day (BID) | ORAL | Status: DC
  Administered 2014-12-09 – 2014-12-11 (×5): 6.25 mg via ORAL
  Filled 2014-12-09 (×5): qty 2

## 2014-12-09 MED ORDER — DIPHENHYDRAMINE HCL 50 MG/ML IJ SOLN
25.0000 mg | Freq: Once | INTRAMUSCULAR | Status: AC
  Administered 2014-12-09: 25 mg via INTRAVENOUS
  Filled 2014-12-09: qty 1

## 2014-12-09 MED ORDER — SUCRALFATE 1 G PO TABS
1.0000 g | ORAL_TABLET | Freq: Three times a day (TID) | ORAL | Status: DC
  Administered 2014-12-10 – 2014-12-13 (×9): 1 g via ORAL
  Filled 2014-12-09 (×9): qty 1

## 2014-12-09 MED ORDER — DICYCLOMINE HCL 10 MG PO CAPS: 10.0000 mg | ORAL_CAPSULE | Freq: Three times a day (TID) | ORAL | Status: DC | PRN

## 2014-12-09 MED ORDER — HYDROMORPHONE HCL 1 MG/ML IJ SOLN
1.0000 mg | INTRAMUSCULAR | Status: DC | PRN
  Administered 2014-12-09 – 2014-12-11 (×11): 1 mg via INTRAVENOUS
  Filled 2014-12-09 (×11): qty 1

## 2014-12-09 MED ORDER — HYDROMORPHONE HCL 1 MG/ML IJ SOLN
0.5000 mg | Freq: Once | INTRAMUSCULAR | Status: AC
Start: 2014-12-09 — End: 2014-12-09
  Administered 2014-12-09: 0.5 mg via INTRAVENOUS
  Filled 2014-12-09: qty 1

## 2014-12-09 MED ORDER — HYDROCODONE-ACETAMINOPHEN 7.5-325 MG/15ML PO SOLN
ORAL | Status: AC
  Filled 2014-12-09: qty 15

## 2014-12-09 MED ORDER — DOCUSATE SODIUM 100 MG PO CAPS: 100.0000 mg | ORAL_CAPSULE | Freq: Every day | ORAL | Status: DC | PRN

## 2014-12-09 MED ORDER — ALUM & MAG HYDROXIDE-SIMETH 200-200-20 MG/5ML PO SUSP: 30.0000 mL | Freq: Four times a day (QID) | ORAL | Status: DC | PRN

## 2014-12-09 MED ORDER — HYDROMORPHONE HCL 1 MG/ML IJ SOLN
0.5000 mg | Freq: Once | INTRAMUSCULAR | Status: AC
  Administered 2014-12-09: 0.5 mg via INTRAVENOUS
  Filled 2014-12-09: qty 1

## 2014-12-09 MED ORDER — ONDANSETRON HCL 4 MG/2ML IJ SOLN
4.0000 mg | Freq: Once | INTRAMUSCULAR | Status: AC
  Administered 2014-12-09: 4 mg via INTRAVENOUS
  Filled 2014-12-09: qty 2

## 2014-12-09 MED ORDER — ONDANSETRON HCL 4 MG PO TABS: 4.0000 mg | ORAL_TABLET | Freq: Four times a day (QID) | ORAL | Status: DC | PRN

## 2014-12-09 MED ORDER — PREDNISONE 20 MG PO TABS
30.0000 mg | ORAL_TABLET | Freq: Every day | ORAL | Status: DC
  Administered 2014-12-10 – 2014-12-14 (×5): 30 mg via ORAL
  Filled 2014-12-09 (×10): qty 1

## 2014-12-09 MED ORDER — SODIUM CHLORIDE 0.9 % IV SOLN
Freq: Once | INTRAVENOUS | Status: AC
  Administered 2014-12-09: 22:00:00 via INTRAVENOUS

## 2014-12-09 MED ORDER — ACETAMINOPHEN 650 MG RE SUPP: 650.0000 mg | Freq: Four times a day (QID) | RECTAL | Status: DC | PRN

## 2014-12-09 NOTE — ED Notes (Signed)
Patient reassess by myself. States he feels "about the same". VS include: BP 92/52, sitting HR 110. Wife at patients side.

## 2014-12-09 NOTE — H&P (Addendum)
History and Physical  David MoleMichael A Redder GMW:[id number] DOB: Apr 04, 1976 DOA: 12/09/2014  Referring physician: Dr Estell HarpinZammit, ED physician PCP: Harlow AsaLUKING,W S, MD   Chief Complaint: weakness  HPI: David [name] is a 38 y.o. male  With a history of Crohn disease with fistula, Stage III fatty liver fibrosis and chronic liver disease, anemia, hypertension, failure to thrive, on chronic steroids.  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 Patient has a gastrostomy tube placed percutaneously. Over the past several weeks, the patient's abdominal pain has been increasing. Patient has also been increasingly intolerant of oral foods and liquids. Due to weakness, lightheadedness and dizziness while in relating, the patient presented the hospital for evaluation.   Review of Systems:  Pt complains of lightheadedness and dizziness while in relating. Abdominal pain, nausea, vomiting.  Pt denies any fevers, chills, chest pain, shortness of breath, cough, wheeze, vision changes, melena, bright red blood per rectum.  Review of systems are otherwise negative  Past Medical History  Diagnosis Date  . 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 Hepatomegaly 05/04/2011  . Fatty liver 05/04/2011    "stage III fatty liver fibrosis" (11/15/2012)  . GERD (gastroesophageal reflux disease)   . Chronic liver disease     /notes 11/15/2012  . Pericarditis     [name]/notes 11/15/2012  . Hypertension   . Pneumonia 1977  . Shortness of breath     "all the time right now" (11/15/2012)  . History of blood transfusion 2004  . History of stomach ulcers   . Duodenal ulcer   . Depression   . Kidney stones     bilaterally/notes 11/15/2012  . Hepatic fibrosis     [name]/notes 11/15/2012  .  Anemia   . Pericardial effusion 10/29/2012    moderate to large/notes 11/15/2012  . Anxiety   . Hepatitis   . Crohn's disease   . ED (erectile dysfunction)    Past Surgical History  Procedure Laterality Date  . Anal examination under anesthesia  02/11/2011    [name]  . Treatment fistula anal  07/03/11    This was a second surgery to repair Anal fistula  . Small intestine surgery  2004    "had hole cut in small intestines during endoscopy; had to have OR & leave me open 3 months" (11/15/2012)  . Cholecystectomy  ~ 2003  . Appendectomy  ~ 2003  . Video assisted thoracoscopy  11/17/2012    Procedure: VIDEO ASSISTED THORACOSCOPY;  Surgeon: Loreli SlotSteven C Hendrickson, MD;  [facility];  Service: Thoracic;  Laterality: Left;  drainage of left pleural effusion  . Pericardial window  11/17/2012    Procedure: PERICARDIAL WINDOW;  Surgeon: Loreli SlotSteven C Hendrickson, MD;  [facility];  Service: Thoracic;  Laterality: N/A;  . Esophagogastroduodenoscopy  01/06/2013    Procedure: ESOPHAGOGASTRODUODENOSCOPY (EGD);  Surgeon: Malissa HippoNajeeb U Rehman, MD;  [facility];  Service: Endoscopy;  Laterality: N/A;  11:15  . Esophagogastroduodenoscopy N/A 03/09/2014    Procedure: ESOPHAGOGASTRODUODENOSCOPY (EGD);  Surgeon: Malissa HippoNajeeb U Rehman, MD;  Location: [facility];  Service: Endoscopy;  Laterality: N/A;  . Gastrostomy tube placement     Social History:  reports that he has been smoking Cigarettes.  He started smoking about 2 years ago. He has a 12 pack-year  smoking history. His smokeless tobacco use includes Snuff. He reports that he does not drink alcohol or use illicit drugs. Patient lives at home & is able to participate in activities of daily living  Allergies  Allergen Reactions  . Remicade [Infliximab]     Dr Karilyn Cota states previous respiratory arrest not related to remicade. Dr Karilyn Cota states remicade not a drug related allergy. 07-07-2013 at 1025 rapid response called to PACU- Patient difficulty  breathing after infusion of Remicade infusing. Do NOT Give Remicade!  . Fentanyl And Related Itching    Swelling, Redness  . Alfentanil Itching    Swelling, Redness  . Wellbutrin [Bupropion] Nausea And Vomiting  . Morphine And Related Hives    Family History  Problem Relation Age of Onset  . Diabetes Mother   . Diabetes Brother   . Healthy Daughter   . Healthy Son      Prior to Admission medications   Medication Sig Start Date End Date Taking? Authorizing Provider  calcium-vitamin D (OSCAL 500/200 D-3) 500-200 MG-UNIT per tablet Take 1 tablet by mouth 2 (two) times daily. 11/27/14  Yes Malissa Hippo, MD  carvedilol (COREG) 6.25 MG tablet Take 6.25 mg by mouth 2 (two) times daily.  10/23/13 12/09/14 Yes Historical Provider, MD  dicyclomine (BENTYL) 10 MG capsule Take 1 capsule (10 mg total) by mouth 3 (three) times daily as needed (stomach cramps). Patient taking differently: Take 10 mg by mouth daily as needed for spasms (stomach cramps).  06/18/14  Yes Malissa Hippo, MD  ferrous gluconate (FERGON) 324 MG tablet Take 324 mg by mouth 3 (three) times daily. 11/21/14  Yes Historical Provider, MD  fish oil-omega-3 fatty acids 1000 MG capsule Take 1 g by mouth 3 (three) times daily. 02/09/12  Yes Malissa Hippo, MD  furosemide (LASIX) 40 MG tablet Take 1 tablet (40 mg total) by mouth daily as needed. Patient taking differently: Take 40 mg by mouth daily as needed for fluid.  11/27/14  Yes Malissa Hippo, MD  metoCLOPramide (REGLAN) 5 MG tablet Take 1 tablet (5 mg total) by mouth 3 (three) times daily as needed for nausea. 05/14/14  Yes Malissa Hippo, MD  metolazone (ZAROXOLYN) 5 MG tablet Take 5 mg by mouth daily as needed. For fluid 06/18/14  Yes Historical Provider, MD  Multiple Vitamin (MULTI-VITAMINS) TABS Take 1 tablet by mouth daily. 06/11/11  Yes Historical Provider, MD  Oxycodone HCl 10 MG TABS Take 10 mg by mouth 4 (four) times daily as needed. For pain 11/09/14  Yes Historical  Provider, MD  OxyCODONE HCl 20 MG/ML CONC Take 20 mg by mouth 4 (four) times daily. 11/23/14  Yes Malissa Hippo, MD  pantoprazole (PROTONIX) 40 MG tablet Take 1 tablet (40 mg total) by mouth 2 (two) times daily. 06/18/14  Yes Malissa Hippo, MD  polyethylene glycol (MIRALAX / GLYCOLAX) packet Take 17 g by mouth daily as needed for mild constipation or moderate constipation.  10/26/14  Yes Historical Provider, MD  potassium chloride (K-DUR,KLOR-CON) 10 MEQ tablet Take 20 mEq by mouth daily.   Yes Historical Provider, MD  predniSONE (DELTASONE) 10 MG tablet Take 30 mg by mouth daily. 10/26/14  Yes Historical Provider, MD  Probiotic Product (ALIGN) 4 MG CAPS Take 4 mg by mouth daily.    Yes Historical Provider, MD  promethazine (PHENERGAN) 25 MG tablet Take 1 tablet (25 mg total) by mouth 2 (two) times daily as needed. nausea 11/27/14  Yes Malissa Hippo,  MD  sucralfate (CARAFATE) 1 G tablet Take 1 g by mouth 3 (three) times daily before meals.   Yes Historical Provider, MD  flintstones complete (FLINTSTONES) 60 MG chewable tablet Chew 1 tablet by mouth 2 (two) times daily. 11/27/14   Malissa Hippo, MD    Physical Exam: BP 103/56 mmHg  Pulse 101  Temp(Src) 98 F (36.7 C) (Oral)  Resp 10  Ht 5\' 11"  (1.803 m)  Wt 69.854 kg (154 lb)  BMI 21.49 kg/m2  SpO2 98%  General: Young Caucasian male. Awake and alert and oriented x3. No acute cardiopulmonary distress.  Eyes: Pupils equal, round, reactive to light. Extraocular muscles are intact. Sclerae anicteric and noninjected.  ENT: Moist mucosal membranes. No mucosal lesions.   Neck: Neck supple without lymphadenopathy. No carotid bruits. No masses palpated.  Cardiovascular: Regular rate with normal S1-S2 sounds. No murmurs, rubs, gallops auscultated. No JVD.  Respiratory: Good respiratory effort with no wheezes, rales, rhonchi. Lungs clear to auscultation bilaterally.  Abdomen: Soft, diffusely tender abdominal pain, particularly in the right  lower quadrant, nondistended. Active bowel sounds. No masses or hepatosplenomegaly  Skin: Dry, warm to touch. 2+ dorsalis pedis and radial pulses. Musculoskeletal: No calf or leg pain. All major joints not erythematous nontender.  Psychiatric: Intact judgment and insight.  Neurologic: No focal neurological deficits. Cranial nerves II through XII are grossly intact.           Labs on Admission:  Basic Metabolic Panel:  Recent Labs Lab 12/04/14 1222 12/09/14 1342  NA 125* 121*  K 4.7 3.8  CL 82* 81*  CO2 27 30  GLUCOSE 123* 112*  BUN 46* 79*  CREATININE 1.62* 1.51*  CALCIUM 9.4 7.8*  MG 2.3  --    Liver Function Tests:  Recent Labs Lab 12/04/14 1222 12/09/14 1342  AST 24 31  ALT 29 24  ALKPHOS 121* 105  BILITOT 0.8 0.9  PROT 5.6* 5.0*  ALBUMIN 3.4* 2.6*   No results for input(s): LIPASE, AMYLASE in the last 168 hours. No results for input(s): AMMONIA in the last 168 hours. CBC:  Recent Labs Lab 12/04/14 1222 12/09/14 1342  WBC 28.3* 19.5*  NEUTROABS  --  16.7*  HGB 9.9* 7.1*  HCT 32.1* 22.4*  MCV 77.2* 77.0*  PLT 249 199   Cardiac Enzymes: No results for input(s): CKTOTAL, CKMB, CKMBINDEX, TROPONINI in the last 168 hours.  BNP (last 3 results) No results for input(s): PROBNP in the last 8760 hours. CBG: No results for input(s): GLUCAP in the last 168 hours.  Radiological Exams on Admission: Ct Abdomen Pelvis Wo Contrast  12/09/2014   CLINICAL DATA:  Weakness for 4 days with low-grade fever, history of Crohn's disease, recent gastrostomy placement  EXAM: CT ABDOMEN AND PELVIS WITHOUT CONTRAST  TECHNIQUE: Multidetector CT imaging of the abdomen and pelvis was performed following the standard protocol without IV contrast.  COMPARISON:  03/07/2014, 10/15/2014  FINDINGS: Lung bases again demonstrate diffuse reticulonodular airspace disease particularly in the right lower lobe but to a lesser degree in the left lower lobe stable from the recent MRI  examination.  Gallbladder has been surgically removed. The liver is stable in appearance. The spleen is enlarged in size but stable from the prior MRI examination. Stomach is significantly distended and a gastrostomy catheter is noted in place. The adrenal glands and pancreas are within normal limits. The kidneys are well visualized bilaterally and reveal some cystic change on the right. Fullness of the collecting systems is noted  bilaterally although no definitive obstructive changes are seen. No ureteral calculi are seen. Mild ascites is noted.  There is some generalized distention of the small bowel without definitive obstructive change. Changes are similar to that seen on the prior exam. Changes may be related to the patient's given clinical history of Crohn's disease. This appearance is stable from the prior exam bony structures are stable in appearance. The appendix is not visualized consistent with prior cholecystectomy.  IMPRESSION: Splenomegaly likely related to a degree of portal hypertension. This is stable from previous exams.  Gastrostomy catheter in place within a distended stomach.  Mild fullness of the collecting systems bilaterally although no true obstructive changes are seen.  Generalized prominence of the small bowel which is stable when compared with prior exams. This may be related to the patient's underlying Crohn's disease.  Mild ascites stable from the prior exam.   Electronically Signed   By: Alcide Clever M.D.   On: 12/09/2014 17:47   Dg Chest Portable 1 View  12/09/2014   CLINICAL DATA:  Decreased oral intake, generalized weakness  EXAM: PORTABLE CHEST - 1 VIEW  COMPARISON:  08/21/2014  FINDINGS: Cardiac shadow is stable. The left lung is clear. The right lung is well aerated but demonstrates some patchy infiltrative changes in the right lung base which may be related aspiration pneumonia. Clinical correlation is recommended.  IMPRESSION: New right basilar infiltrative change. This may  represent aspiration pneumonia. Clinical correlation is recommended.   Electronically Signed   By: Alcide Clever M.D.   On: 12/09/2014 14:58    Assessment/Plan Present on Admission:  . Hyponatremia . Symptomatic anemia . Leukocytosis . Adult failure to thrive . Severe dehydration  #1 hyponatremia #2 symptomatically anemia #3 leukocytosis #4 Crohn disease #5 hepatic fibrosis #6 failure to thrive #7 severe dehydration  Admit to MedSurg IV normal saline at 125 an hour to correct Metabolic panels every 4 hours Transfuse 2 units of blood Premedicate with Tylenol and Benadryl Consult GI  DVT prophylaxis: Lovenox  Consultants: GI  Code Status: Full  Family Communication: Mother in the room   Disposition Plan: Pending  Time spent: 79 minutes  Candelaria Celeste, DO [facility] Pager [phone]    Per GI, please correct electrolyte abnormalities,  Dr. Karilyn Cota believes that tpn might be appropriate for the patient.

## 2014-12-09 NOTE — ED Notes (Signed)
Pt wife in states that her husband is here with "low blood pressure". Pt assisted by myself and security to back waiting area. Pt and his wife reports his blood pressure at home was sbp50-60/dbp45, they report that patients baseline is sbp70-80/dbp50-60.   VS upon arrival: BP 91/55 sitting, 100% RA, 97.6 oral, HR 121. Pt pale but in not acute distress. Able to answer questions appropriately. Triage nurse Lurena Joiner informed of all of the above, will triage next.   Patient and his wife made aware that if pt begans to feel worse prior to triage to let myself or registration know and they will inform me.

## 2014-12-09 NOTE — ED Notes (Signed)
Patient requesting pain medication. MD aware and will be in to see patient shortly.

## 2014-12-09 NOTE — ED Notes (Signed)
PT reports low po inake, light headiness, hypotension, nausea and generalized weakness x4 days. PT reports getting a bolus of IV fluids at [facility] on 12/05/14. 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

## 2014-12-09 NOTE — ED Provider Notes (Signed)
CSN: [id number]     Arrival date & time 12/09/14  1308 History  This chart was scribed for David LennertJoseph L Geneviene Tesch, MD by Ronney LionSuzanne Le, ED Scribe. This patient was seen in room APA14/AP[facility]14 and the patient's care was started at 2:26 PM.    Chief Complaint  Patient presents with  . Hypotension   Patient is a 38 y.o. male presenting with weakness. The history is provided by the patient and the spouse (pt complains of weakness). No language interpreter was used.  Weakness This is a recurrent problem. The current episode started more than 2 days ago. The problem occurs constantly. The problem has not changed since onset.Pertinent negatives include no chest pain, no abdominal pain and no headaches. Nothing aggravates the symptoms. Nothing relieves the symptoms.     HPI Comments: David MoleMichael A [name] is a 38 y.o. male who presents to the Emergency Department complaining of weakness and hypotension that began about 4 days ago. Patient endorses nausea and a chronic low-grade subjective fever. He states that is BP is usually low, at around 90/60. Patient is scheduled to go see Dr. Byrd HesselbachWaters, who did his gastrostomy, in 3 days; he was supposed to see him 4 days ago for a procedure, but patient had hypotension then as well, per wife. He denies urinary problems and emesis.   Past Medical History  Diagnosis Date  . 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 Hepatomegaly 05/04/2011  . Fatty liver 05/04/2011    "stage III fatty liver fibrosis" (11/15/2012)  . GERD (gastroesophageal reflux disease)   . Chronic liver disease     /notes 11/15/2012  . Pericarditis     [name]/notes 11/15/2012  . Hypertension   . Pneumonia 1977  . Shortness of breath     "all the time right now" (11/15/2012)  . History of blood transfusion 2004  . History of stomach ulcers   . Duodenal ulcer   . Depression   . Kidney stones     bilaterally/notes 11/15/2012  .  Hepatic fibrosis     [name]/notes 11/15/2012  . Anemia   . Pericardial effusion 10/29/2012    moderate to large/notes 11/15/2012  . Anxiety   . Hepatitis   . Crohn's disease   . ED (erectile dysfunction)    Past Surgical History  Procedure Laterality Date  . Anal examination under anesthesia  02/11/2011    [name]  . Treatment fistula anal  07/03/11    This was a second surgery to repair Anal fistula  . Small intestine surgery  2004    "had hole cut in small intestines during endoscopy; had to have OR & leave me open 3 months" (11/15/2012)  . Cholecystectomy  ~ 2003  . Appendectomy  ~ 2003  . Video assisted thoracoscopy  11/17/2012    Procedure: VIDEO ASSISTED THORACOSCOPY;  Surgeon: Loreli SlotSteven C Hendrickson, MD;  [facility];  Service: Thoracic;  Laterality: Left;  drainage of left pleural effusion  . Pericardial window  11/17/2012    Procedure: PERICARDIAL WINDOW;  Surgeon: Loreli SlotSteven C Hendrickson, MD;  [facility];  Service: Thoracic;  Laterality: N/A;  . Esophagogastroduodenoscopy  01/06/2013    Procedure: ESOPHAGOGASTRODUODENOSCOPY (EGD);  Surgeon: Malissa HippoNajeeb U Rehman, MD;  [facility];  Service: Endoscopy;  Laterality: N/A;  11:15  . Esophagogastroduodenoscopy N/A 03/09/2014    Procedure: ESOPHAGOGASTRODUODENOSCOPY (EGD);  Surgeon: Malissa HippoNajeeb U Rehman, MD;  Location: [facility];  Service: Endoscopy;  Laterality: N/A;  . Gastrostomy tube placement     Family History  Problem Relation Age of Onset  . Diabetes Mother   . Diabetes Brother   . Healthy Daughter   . Healthy Son    History  Substance Use Topics  . Smoking status: Light Tobacco Smoker -- 0.50 packs/day for 24 years    Types: Cigarettes    Start date: 10/28/2012    Last Attempt to Quit: 11/22/2012  . Smokeless tobacco: Current User    Types: Snuff     Comment: 1/2 pack a month  . Alcohol Use: No    Review of Systems  HENT: Negative for congestion, ear discharge and sinus pressure.   Respiratory: Negative for  cough.   Cardiovascular: Negative for chest pain.  Gastrointestinal: Negative for vomiting, abdominal pain and diarrhea.  Genitourinary: Negative for dysuria, urgency, frequency, hematuria, decreased urine volume and difficulty urinating.  Musculoskeletal: Negative for back pain.  Skin: Negative for rash.  Neurological: Positive for weakness. Negative for seizures and headaches.  Psychiatric/Behavioral: Negative for hallucinations.  All other systems reviewed and are negative.     Allergies  Remicade; Fentanyl and related; Alfentanil; Wellbutrin; and Morphine and related  Home Medications   Prior to Admission medications   Medication Sig Start Date End Date Taking? Authorizing Provider  calcium carbonate (OS-CAL) 600 MG TABS Take 600 mg by mouth 2 (two) times daily with a meal.    Historical Provider, MD  calcium-vitamin D (OSCAL 500/200 D-3) 500-200 MG-UNIT per tablet Take 1 tablet by mouth 2 (two) times daily. 11/27/14   Malissa Hippo, MD  carvedilol (COREG) 6.25 MG tablet Take 6.25 mg by mouth 2 (two) times daily.  10/23/13 10/23/14  Historical Provider, MD  ciprofloxacin (CIPRO) 750 MG tablet Take 750 mg by mouth 2 (two) times daily.  11/09/14   Historical Provider, MD  dicyclomine (BENTYL) 10 MG capsule Take 1 capsule (10 mg total) by mouth 3 (three) times daily as needed (stomach cramps). 06/18/14   Malissa Hippo, MD  fish oil-omega-3 fatty acids 1000 MG capsule Take 1 g by mouth 3 (three) times daily. 02/09/12   Malissa Hippo, MD  flintstones complete (FLINTSTONES) 60 MG chewable tablet Chew 1 tablet by mouth 2 (two) times daily. 11/27/14   Malissa Hippo, MD  furosemide (LASIX) 40 MG tablet Take 1 tablet (40 mg total) by mouth daily as needed. 11/27/14   Malissa Hippo, MD  metoCLOPramide (REGLAN) 5 MG tablet Take 1 tablet (5 mg total) by mouth 3 (three) times daily as needed for nausea. 05/14/14   Malissa Hippo, MD  OxyCODONE HCl 20 MG/ML CONC Take 20 mg by mouth 4 (four)  times daily. 11/23/14   Malissa Hippo, MD  pantoprazole (PROTONIX) 40 MG tablet Take 1 tablet (40 mg total) by mouth 2 (two) times daily. 06/18/14   Malissa Hippo, MD  polyethylene glycol (MIRALAX / GLYCOLAX) packet Take 17 g by mouth as needed.  10/26/14   Historical Provider, MD  potassium chloride (K-DUR,KLOR-CON) 10 MEQ tablet Take 20 mEq by mouth daily.    Historical Provider, MD  predniSONE (DELTASONE) 10 MG tablet Take 30 mg by mouth daily. 10/26/14   Historical Provider, MD  Probiotic Product (ALIGN) 4 MG CAPS Take 4 mg by mouth daily.     Historical Provider, MD  promethazine (PHENERGAN) 25 MG tablet Take 1 tablet (25 mg total) by mouth 2 (two) times daily as needed.  nausea 11/27/14   Malissa Hippo, MD   BP 92/52 mmHg  Pulse 110  Temp(Src) 98 F (36.7 C) (Oral)  Resp 18  Ht 5\' 11"  (1.803 m)  Wt 154 lb (69.854 kg)  BMI 21.49 kg/m2  SpO2 100% Physical Exam  Constitutional: He is oriented to person, place, and time. He appears well-developed.  HENT:  Head: Normocephalic.  Eyes: Conjunctivae and EOM are normal. No scleral icterus.  Pale conjunctiva.  Neck: Neck supple. No thyromegaly present.  Cardiovascular: Normal rate and regular rhythm.  Exam reveals no gallop and no friction rub.   No murmur heard. Pulmonary/Chest: No stridor. He has no wheezes. He has no rales. He exhibits no tenderness.  Abdominal: He exhibits distension. There is no tenderness. There is no rebound.  Healing scar vertical incision over abdomen, mild tenderness. Distended abdomen.  Musculoskeletal: Normal range of motion. He exhibits no edema.  Lymphadenopathy:    He has no cervical adenopathy.  Neurological: He is oriented to person, place, and time. He exhibits normal muscle tone. Coordination normal.  Skin: No rash noted. No erythema. There is pallor.  Psychiatric: He has a normal mood and affect. His behavior is normal.  Nursing note and vitals reviewed.   ED Course  Procedures (including  critical care time)  DIAGNOSTIC STUDIES: Oxygen Saturation is 100% on room air, normal by my interpretation.      Labs Review Labs Reviewed  CBC WITH DIFFERENTIAL - Abnormal; Notable for the following:    WBC 19.5 (*)    RBC 2.91 (*)    Hemoglobin 7.1 (*)    HCT 22.4 (*)    MCV 77.0 (*)    MCH 24.4 (*)    RDW 17.8 (*)    Neutrophils Relative % 86 (*)    Lymphocytes Relative 6 (*)    Neutro Abs 16.7 (*)    Monocytes Absolute 1.4 (*)    All other components within normal limits  COMPREHENSIVE METABOLIC PANEL    Imaging Review No results found.   EKG Interpretation None      MDM   Final diagnoses:  None     Dehydration,   Spoke with rehmann and he rec admission for dehydration and gi to see in am   The chart was scribed for me under my direct supervision.  I personally performed the history, physical, and medical decision making and all procedures in the evaluation of this patient.David Lennert, MD 12/09/14 [phone]

## 2014-12-10 ENCOUNTER — Encounter (HOSPITAL_COMMUNITY): Payer: Medicare Other

## 2014-12-10 ENCOUNTER — Encounter (HOSPITAL_COMMUNITY): Payer: Self-pay | Admitting: Gastroenterology

## 2014-12-10 DIAGNOSIS — E871 Hypo-osmolality and hyponatremia: Secondary | ICD-10-CM

## 2014-12-10 DIAGNOSIS — K50819 Crohn's disease of both small and large intestine with unspecified complications: Secondary | ICD-10-CM

## 2014-12-10 DIAGNOSIS — R627 Adult failure to thrive: Secondary | ICD-10-CM

## 2014-12-10 LAB — BASIC METABOLIC PANEL
ANION GAP: 6 (ref 5–15)
Anion gap: 6 (ref 5–15)
Anion gap: 6 (ref 5–15)
Anion gap: 7 (ref 5–15)
BUN: 64 mg/dL — ABNORMAL HIGH (ref 6–23)
BUN: 60 mg/dL — ABNORMAL HIGH (ref 6–23)
BUN: 55 mg/dL — AB (ref 6–23)
BUN: 52 mg/dL — AB (ref 6–23)
CHLORIDE: 97 meq/L (ref 96–112)
CHLORIDE: 94 meq/L — AB (ref 96–112)
CO2: 28 mmol/L (ref 19–32)
CO2: 28 mmol/L (ref 19–32)
CO2: 25 mmol/L (ref 19–32)
CO2: 26 mmol/L (ref 19–32)
CREATININE: 1.11 mg/dL (ref 0.50–1.35)
CREATININE: 1.16 mg/dL (ref 0.50–1.35)
Calcium: 7.1 mg/dL — ABNORMAL LOW (ref 8.4–10.5)
Calcium: 7.2 mg/dL — ABNORMAL LOW (ref 8.4–10.5)
Calcium: 6.7 mg/dL — ABNORMAL LOW (ref 8.4–10.5)
Calcium: 7.2 mg/dL — ABNORMAL LOW (ref 8.4–10.5)
Chloride: 91 mEq/L — ABNORMAL LOW (ref 96–112)
Chloride: 92 mEq/L — ABNORMAL LOW (ref 96–112)
Creatinine, Ser: 1.26 mg/dL (ref 0.50–1.35)
Creatinine, Ser: 1.23 mg/dL (ref 0.50–1.35)
GFR calc Af Amer: 85 mL/min — ABNORMAL LOW (ref >90)
GFR calc Af Amer: >90 mL/min (ref >90)
GFR calc Af Amer: >90 mL/min (ref >90)
GFR calc non Af Amer: 71 mL/min — ABNORMAL LOW (ref >90)
GFR calc non Af Amer: 73 mL/min — ABNORMAL LOW (ref >90)
GFR calc non Af Amer: 83 mL/min — ABNORMAL LOW (ref >90)
GFR calc non Af Amer: 78 mL/min — ABNORMAL LOW (ref >90)
GFR, EST AFRICAN AMERICAN: 82 mL/min — AB (ref >90)
GLUCOSE: 109 mg/dL — AB (ref 70–99)
GLUCOSE: 98 mg/dL (ref 70–99)
GLUCOSE: 117 mg/dL — AB (ref 70–99)
GLUCOSE: 142 mg/dL — AB (ref 70–99)
POTASSIUM: 3.7 mmol/L (ref 3.5–5.1)
POTASSIUM: 3.8 mmol/L (ref 3.5–5.1)
POTASSIUM: 4.5 mmol/L (ref 3.5–5.1)
POTASSIUM: 4.5 mmol/L (ref 3.5–5.1)
SODIUM: 125 mmol/L — AB (ref 135–145)
SODIUM: 126 mmol/L — AB (ref 135–145)
Sodium: 128 mmol/L — ABNORMAL LOW (ref 135–145)
Sodium: 127 mmol/L — ABNORMAL LOW (ref 135–145)

## 2014-12-10 LAB — CBC WITH DIFFERENTIAL/PLATELET
Basophils Absolute: 0 10*3/uL (ref 0.0–0.1)
Basophils Relative: 0 % (ref 0–1)
EOS ABS: 0 10*3/uL (ref 0.0–0.7)
Eosinophils Relative: 0 % (ref 0–5)
HCT: 23.1 % — ABNORMAL LOW (ref 39.0–52.0)
Hemoglobin: 7.5 g/dL — ABNORMAL LOW (ref 13.0–17.0)
LYMPHS ABS: 0.3 10*3/uL — AB (ref 0.7–4.0)
LYMPHS PCT: 4 % — AB (ref 12–46)
MCH: 25.4 pg — AB (ref 26.0–34.0)
MCHC: 32.5 g/dL (ref 30.0–36.0)
MCV: 78.3 fL (ref 78.0–100.0)
MONO ABS: 0.7 10*3/uL (ref 0.1–1.0)
Monocytes Relative: 8 % (ref 3–12)
Neutro Abs: 7.6 10*3/uL (ref 1.7–7.7)
Neutrophils Relative %: 88 % — ABNORMAL HIGH (ref 43–77)
PLATELETS: 105 10*3/uL — AB (ref 150–400)
RBC: 2.95 MIL/uL — AB (ref 4.22–5.81)
RDW: 16.8 % — AB (ref 11.5–15.5)
WBC: 8.7 10*3/uL (ref 4.0–10.5)

## 2014-12-10 LAB — CBC
HCT: 22.5 % — ABNORMAL LOW (ref 39.0–52.0)
Hemoglobin: 7.3 g/dL — ABNORMAL LOW (ref 13.0–17.0)
MCH: 25.5 pg — ABNORMAL LOW (ref 26.0–34.0)
MCHC: 32.4 g/dL (ref 30.0–36.0)
MCV: 78.7 fL (ref 78.0–100.0)
PLATELETS: 107 10*3/uL — AB (ref 150–400)
RBC: 2.86 MIL/uL — AB (ref 4.22–5.81)
RDW: 16.8 % — ABNORMAL HIGH (ref 11.5–15.5)
WBC: 9.5 10*3/uL (ref 4.0–10.5)

## 2014-12-10 LAB — PROTIME-INR
INR: 1.53 — ABNORMAL HIGH (ref 0.00–1.49)
Prothrombin Time: 18.5 seconds — ABNORMAL HIGH (ref 11.6–15.2)

## 2014-12-10 MED ORDER — SODIUM CHLORIDE 0.9 % IJ SOLN: 10.0000 mL | INTRAMUSCULAR | Status: DC | PRN

## 2014-12-10 MED ORDER — FAT EMULSION 20 % IV EMUL
250.0000 mL | INTRAVENOUS | Status: AC
  Administered 2014-12-10: 250 mL via INTRAVENOUS
  Filled 2014-12-10: qty 250

## 2014-12-10 MED ORDER — OXYCODONE HCL 20 MG/ML PO CONC
20.0000 mg | Freq: Four times a day (QID) | ORAL | Status: DC
  Administered 2014-12-10 – 2014-12-13 (×16): 20 mg via ORAL
  Filled 2014-12-10 (×17): qty 1

## 2014-12-10 MED ORDER — SODIUM CHLORIDE 0.9 % IJ SOLN
10.0000 mL | Freq: Two times a day (BID) | INTRAMUSCULAR | Status: DC
  Administered 2014-12-10 – 2014-12-14 (×6): 10 mL

## 2014-12-10 MED ORDER — TRACE MINERALS CR-CU-F-FE-I-MN-MO-SE-ZN IV SOLN
INTRAVENOUS | Status: AC
  Administered 2014-12-10: 19:00:00 via INTRAVENOUS
  Filled 2014-12-10: qty 2000

## 2014-12-10 NOTE — Progress Notes (Signed)
Orders placed for IR on 12/29 for a larger bore G tube for decompression. Spoke with Misty Stanley, who states [facility] will contact nursing with information regarding transport and timing. I have started the EMTALA and medical necessity forms.

## 2014-12-10 NOTE — Progress Notes (Signed)
PARENTERAL NUTRITION CONSULT NOTE - INITIAL  Pharmacy Consult for TPN Indication: intolerance to oral feeding  Allergies  Allergen Reactions  . Remicade [Infliximab]     Dr Karilyn Cota states previous respiratory arrest not related to remicade. Dr Karilyn Cota states remicade not a drug related allergy. 07-07-2013 at 1025 rapid response called to PACU- Patient difficulty breathing after infusion of Remicade infusing. Do NOT Give Remicade!  . Fentanyl And Related Itching    Swelling, Redness  . Alfentanil Itching    Swelling, Redness  . Wellbutrin [Bupropion] Nausea And Vomiting  . Morphine And Related Hives   Patient Measurements: Height: 5\' 11"  (180.3 cm) Weight: 150 lb 13.4 oz (68.42 kg) IBW/kg (Calculated) : 75.3 Adjusted Body Weight: n/a  Vital Signs: Temp: 98.3 F (36.8 C) (12/28 0418) Temp Source: Oral (12/28 0418) BP: 85/42 mmHg (12/28 0418) Pulse Rate: 90 (12/28 0418) Intake/Output from previous day: 12/27 0701 - 12/28 0700 In: 670 [Blood:670] Out: -  Intake/Output from this shift: Total I/O In: -  Out: 600 [Urine:600]  Labs:  Recent Labs  12/09/14 1342 12/10/14 0701 12/10/14 1130  WBC 19.5* 9.5 8.7  HGB 7.1* 7.3* 7.5*  HCT 22.4* 22.5* 23.1*  PLT 199 107* 105*  INR  --   --  1.53*     Recent Labs  12/09/14 1342 12/09/14 2025 12/10/14 0701 12/10/14 1130  NA 121* 126* 125* 126*  K 3.8 3.8 3.7 3.8  CL 81* 87* 91* 92*  CO2 30 31 28 28   GLUCOSE 112* 99 109* 98  BUN 79* 71* 64* 60*  CREATININE 1.51* 1.42* 1.26 1.23  CALCIUM 7.8* 7.7* 7.1* 7.2*  PROT 5.0*  --   --   --   ALBUMIN 2.6*  --   --   --   AST 31  --   --   --   ALT 24  --   --   --   ALKPHOS 105  --   --   --   BILITOT 0.9  --   --   --    Estimated Creatinine Clearance: 78.8 mL/min (by C-G formula based on Cr of 1.23).   No results for input(s): GLUCAP in the last 72 hours.  Medical History: Past Medical History  Diagnosis Date  . 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 Crohn's disease with  fistula 05/04/2011    both large and small intestinges/notes 11/15/2012  . Hepatomegaly 05/04/2011  . Fatty liver 05/04/2011    "stage III fatty liver fibrosis" (11/15/2012)  . GERD (gastroesophageal reflux disease)   . Chronic liver disease     /notes 11/15/2012  . Pericarditis     [name] 11/15/2012  . Hypertension   . Pneumonia 1977  . Shortness of breath     "all the time right now" (11/15/2012)  . History of blood transfusion 2004  . History of stomach ulcers   . Duodenal ulcer   . Depression   . Kidney stones     bilaterally/notes 11/15/2012  . Hepatic fibrosis     [name] 11/15/2012  . Anemia   . Pericardial effusion 10/29/2012    moderate to large/notes 11/15/2012  . Anxiety   . Hepatitis   . Crohn's disease   . ED (erectile dysfunction)   . 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  Was [place]. Over that period of time he progressively got weaker and weaker and felt more ill or nauseous and was unable to keep down much by mouth.  His last meal was yesterday and was grits in the morning and he only had 4-5 bites. Of note he has been taking relatively large doses of Roxanol 20 mg 4 times a day which has helped his pain.   Nutritional Goals:  1800-1900 kCal, 105-115 grams of protein per day  Plan:   Begin Clinimix E 5/15 at 3650ml/hr (to start at 6pm)  Provide lipids, trace elements, and MVI with  TPN  Monitor lytes, triglycerides, glucose tolerance, renal fxn, and fluid status  Transition to tube feedings when improved / feasible  Valrie HartHall, Brady Schiller A 12/10/2014,1:33 PM

## 2014-12-10 NOTE — Progress Notes (Signed)
Orders to connect G-tube to LIS for decompression, tube disconnected hooked to suction, no drainage noted, flushed with 60cc tap water x 2 easily, returned brown colored fluid when aspirated with syringe, hooked back to LIS.

## 2014-12-10 NOTE — Progress Notes (Signed)
   12/10/14 1438  Section 1: Physician Certification  Patient Condition Benefit outweighs risk  Reason for Transfer MD request;Hospital resources not available  Relationship to patient Admitting Physician  Benefits of Transfer Necessities available at receiving facility  Risks of Transfer Time delay;Accidents or delays ;Worsening condition  Accepting Physician Interventional Radiology  Sending Physician Dr. Jena Gauss   Physician Reassessment Patient examined and risks and benefits explained  Section 2: Nurse Certification  Transport By [facility]

## 2014-12-10 NOTE — Progress Notes (Addendum)
David [name] [id number] DOB: 23-Sep-1976 DOA: 12/09/2014 PCP: Harlow AsaLUKING,W S, MD  Brief narrative: 38 y/o ? 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 The likely volume depleted and hypotensive given a height of saline and was sent home because of his request and wanted to be with his family for the holidays. Over that period of time he progressively got weaker and weaker and felt more ill or nauseous and was unable to keep down much by mouth he His last meal was yesterday and was grips in the morning and he only had 45 bites. Of note he has been taking relatively large doses of Roxanol 20 mg 4 times a day which has helped his pain  Past medical history-As per Problem list Chart reviewed as below-   Consultants:  GI  Procedures:  none  Antibiotics:  none   Subjective  States pain is 7/10 and has no other real complaint at this time. Feels a little bit bloated, feels distended and gassy Passing flatus No nausea no vomiting No fever No rash Passes stool every 2-3 days    Objective    Interim History: None  Telemetry: None telemetry   Objective: Filed Vitals:   12/10/14 0130 12/10/14 0207 12/10/14 0222 12/10/14 0418  BP: 98/50 96/50 96/49  85/42  Pulse: 100 102 97 90  Temp: 98.1 F (36.7 C) 98.3 F (36.8 C) 98.2 F (36.8 C) 98.3 F (36.8 C)  TempSrc: Oral Oral Oral Oral  Resp: 16 16 16 16   Height:      Weight:      SpO2: 100% 100% 98% 98%    Intake/Output Summary (Last 24 hours) at 12/10/14 1053 Last data filed at 12/10/14 1028  Gross per 24 hour  Intake    670 ml  Output    400 ml  Net    270 ml     Exam:  General: EOMI NCAT, temporalis and supraclavicular fossa wasting Cardiovascular: S1-S2 no murmur rub or gallop Respiratory: Clinically clear no added sound Abdomen: Soft but distended, staples in the midline appeared to be well opposed, feeding tube appears to be backed up but does not appear to have any overt infection there is no shifting dullness am not able to appreciate any organomegaly Skin none Neuro intact  Data Reviewed: Basic Metabolic Panel:  Recent Labs Lab 12/04/14 1222 12/09/14 1342 12/09/14 2025 12/10/14 0701  NA 125* 121* 126* 125*  K 4.7 3.8 3.8 3.7  CL 82* 81* 87* 91*  CO2 27 30 31 28   GLUCOSE 123* 112* 99 109*  BUN 46* 79* 71* 64*  CREATININE 1.62* 1.51* 1.42* 1.26  CALCIUM 9.4 7.8* 7.7* 7.1*  MG 2.3  --   --   --    Liver Function Tests:  Recent Labs Lab 12/04/14 1222 12/09/14 1342  AST 24 31  ALT 29 24  ALKPHOS 121* 105  BILITOT 0.8 0.9  PROT 5.6* 5.0*  ALBUMIN 3.4* 2.6*   No results for input(s): LIPASE, AMYLASE in the last 168 hours. No results for input(s): AMMONIA in the last 168 hours. CBC:  Recent Labs Lab 12/04/14 1222 12/09/14 1342  12/10/14 0701  WBC 28.3* 19.5* 9.5  NEUTROABS  --  16.7*  --   HGB 9.9* 7.1* 7.3*  HCT 32.1* 22.4* 22.5*  MCV 77.2* 77.0* 78.7  PLT 249 199 107*   Cardiac Enzymes: No results for input(s): CKTOTAL, CKMB, CKMBINDEX, TROPONINI in the last 168 hours. BNP: Invalid input(s): POCBNP CBG: No results for input(s): GLUCAP in the last 168 hours.  No results found for this or any previous visit (from the past 240 hour(s)).   Studies:              All Imaging reviewed and is as per above notation   Scheduled Meds: . carvedilol  6.25 mg Oral BID WC  . enoxaparin (LOVENOX) injection  40 mg Subcutaneous Q24H  . feeding supplement (ENSURE COMPLETE)  237 mL Oral BID BM  . oxyCODONE  20 mg Oral QID  . pantoprazole  40 mg Oral BID  . predniSONE  30 mg Oral Daily  . sucralfate  1 g Oral TID  AC   Continuous Infusions: . sodium chloride 125 mL/hr at 12/09/14 1950     Assessment/Plan: 1. Crohn's disease-currently under treatment at [facility], has been seen at [facility] in the past-sees Dr. Karilyn Cota in [place] every month for maintenance.  Agree with TPN. Order placed for PICC line. Defer to Dr. Kendell Bane to determine further plans for treatment of Crohn's. I have offered a Renae Gloss transferred to [facility] given he has most of his care set up there but feel it is reasonable to keep him here if we can manage her. Continue prednisone 30 mg daily 2. Acute hyponatremia-probably secondary to poor solute intake with liberalized by mouth water-would replace sodium at 50 cc per hour instead of 1 25 cc per hour to prevent theoretical central pontine myelinolysis.  Repeat labs every 6 hours 3. 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 He will need to be managed holistically by gastroenterology. For now we can hold his home dose of Lasix 40 mg as well Zaroxolyn 5 mg. He is to continue Coreg although may benefit from a nonselective beta blocker for secondary variceal bleed prevention 4. Gastroparesis?-Continue Reglan 5 mg 3 times a day, 5. Reflux continue pantoprazole 40 twice a day , continue sucralfate 1 g 3 times a day 6. Acute Iron deficiency anemia-transfused 2 units packed red blood cells. Repeat CBC now and then follow results   Code Status: full Family Communication:  None bedside Disposition Plan: inpatient   Pleas Koch, MD  [facility] Pager [phone] 12/10/2014, 10:53 AM    LOS: 1 day

## 2014-12-10 NOTE — Progress Notes (Signed)
   12/10/14 1442  Medical Necessity for Transport Certificate --- IF THIS TRANSPORT IS ROUND TRIP OR SCHEDULED AND REPEATED, A PHYSICIAN MUST COMPLETE THIS FORM  Transport to (Location) [facility]  Reason for Transport Procedure  Name of Transporting Agency [facility]  Round Trip Transport? Yes  Q1 Are ALL the following "true" for this patient?              1. Unable to get up from bed without assistance  AND            2. Unable to ambulate AND        3. Unable to sit in a chair, including a wheelchair. No  Q2 Could the patient be transported safely by other means of transportation (I.E., wheelchair van)? No  Q3 Reason based on Facility and/or [employer] not available at original facility  Building surveyor (can be: Physician, RN, NP, PA, DP, CNS) Nira Retort, ANP

## 2014-12-10 NOTE — Progress Notes (Signed)
UR chart review completed.  

## 2014-12-10 NOTE — Consult Note (Signed)
Referring Provider: Dr. Adrian Blackwater Primary Care Physician:  [name], MD Primary Gastroenterologist:  Dr. Karilyn Cota   Date of Admission: 12/09/14 Date of Consultation: 12/10/14  Reason for Consultation:  Crohn's disease, cirrhosis, failure to thrive  HPI:  David [name] is a 38 y.o. 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 He is well established with Dr. 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 A 12 F catheter was placed by [facility] via IR on 11/19/14 to serve as a G tube. G tube study on 12/05/14 showed suitable position. He has had multiple colonoscopies, upper endoscopies. Last EGD in March 2015 with erosive/ulcerative reflux esophagitis and portal gastropathy. Last colonoscopy Aug 2015 by Dr. Edyth Gunnels at [facility] with ileal stricture s/p dilation. He presented this admission with severe weakness, fatigue, failure to thrive.   Notes abdominal pain in a "crescent moon-shaped" pattern extending from upper abdomen around right side and lower right abdomen/lower abdomen.  Intermittent pain every 2-3 minutes. Sharp pain. In tears at time of consultation. Mild nausea. No oral intake today, NPO this morning. Poor oral intake overall. G tube for drainage. He tells me he was to have a larger tube placed via IR but "my BP was too low". He reports an appt upcoming at [facility] on Wednesday at 9:30.     At home able to tolerate soft foods. Poor appetite. BM once every 2-3 days. No rectal bleeding. No confusion. Cirrhosis, notes chronic lower extremity edema. No dysphagia. Reflux chronic. Weight noted as 205 in Nov 2015 at [facility], 163 Dec 15, and now 150.   Reports upcoming appt at [facility] on Jan  8th for consideration of dual liver and small bowel transplant.   Past Medical History  Diagnosis Date  . 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 Hepatomegaly 05/04/2011  . Fatty liver 05/04/2011    "stage III fatty liver fibrosis" (11/15/2012)  . GERD (gastroesophageal reflux disease)   . Chronic liver disease     /notes 11/15/2012  . Pericarditis     [name] 11/15/2012  . Hypertension   . Pneumonia 1977  . Shortness of breath     "all the time right now" (11/15/2012)  . History of blood transfusion 2004  . History of stomach ulcers   . Duodenal ulcer   . Depression   . Kidney stones     bilaterally/notes 11/15/2012  . Hepatic fibrosis     [name] 11/15/2012  . Anemia   . Pericardial effusion 10/29/2012    moderate to large/notes 11/15/2012  . Anxiety   . Hepatitis   . Crohn's disease   . ED (erectile dysfunction)   . 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 Anal examination under anesthesia  02/11/2011    [name]  . Treatment fistula anal  07/03/11    This was a second surgery to repair Anal fistula  . Gastrojejunostomy  2004    "had hole cut in small intestines during endoscopy; had to have OR & leave me open 3 months" (11/15/2012)  . Cholecystectomy  ~  2003  . Appendectomy  ~ 2003  . Video assisted thoracoscopy  11/17/2012    Procedure: VIDEO ASSISTED THORACOSCOPY;  Surgeon: Loreli Slot, MD;  Location: [facility] OR;  Service: Thoracic;  Laterality: Left;  drainage of left pleural effusion  . Pericardial window  11/17/2012    Procedure: PERICARDIAL WINDOW;  Surgeon: Loreli Slot, MD;  Location: [facility] OR;  Service: Thoracic;  Laterality: N/A;  . Esophagogastroduodenoscopy  01/06/2013    Procedure: ESOPHAGOGASTRODUODENOSCOPY (EGD);  Surgeon: Malissa Hippo, MD;  Location[facility];  Service: Endoscopy;   Laterality: N/A;  11:15  . Esophagogastroduodenoscopy N/A 03/09/2014    Dr. Karilyn Cota: erosive/ulcerative reflux esophagiits, portal gastropathy, moderate amount of bile and food debris in stomach precluding visualization of GJ anastomosis  . Gastrostomy tube placement  11/19/14    [facility]: 79 F APDL catheter. Checked again 12/23 via fluoroscopy and in suitable position  . Colonoscopy  Aug 2015    Dr. Edyth Gunnels at [facility]: congested mucosa in entire colon, solitary ulcer distal ileum, stricture in ileum 5 cm from IC valve s/p dilation. Surveillance in 1 year  . Exploratory laparotomy  Nov 05, 2014    [facility], Dr. Byrd Hesselbach. Several hour operation with unsuccessful lysis of adhesions due to frozen abdomen    Prior to Admission medications   Medication Sig Start Date End Date Taking? Authorizing Provider  calcium-vitamin D (OSCAL 500/200 D-3) 500-200 MG-UNIT per tablet Take 1 tablet by mouth 2 (two) times daily. 11/27/14  Yes Malissa Hippo, MD  carvedilol (COREG) 6.25 MG tablet Take 6.25 mg by mouth 2 (two) times daily.  10/23/13 12/09/14 Yes Historical Provider, MD  dicyclomine (BENTYL) 10 MG capsule Take 1 capsule (10 mg total) by mouth 3 (three) times daily as needed (stomach cramps). Patient taking differently: Take 10 mg by mouth daily as needed for spasms (stomach cramps).  06/18/14  Yes Malissa Hippo, MD  ferrous gluconate (FERGON) 324 MG tablet Take 324 mg by mouth 3 (three) times daily. 11/21/14  Yes Historical Provider, MD  fish oil-omega-3 fatty acids 1000 MG capsule Take 1 g by mouth 3 (three) times daily. 02/09/12  Yes Malissa Hippo, MD  furosemide (LASIX) 40 MG tablet Take 1 tablet (40 mg total) by mouth daily as needed. Patient taking differently: Take 40 mg by mouth daily as needed for fluid.  11/27/14  Yes Malissa Hippo, MD  metoCLOPramide (REGLAN) 5 MG tablet Take 1 tablet (5 mg total) by mouth 3 (three) times daily as needed for nausea. 05/14/14  Yes Malissa Hippo, MD  metolazone  (ZAROXOLYN) 5 MG tablet Take 5 mg by mouth daily as needed. For fluid 06/18/14  Yes Historical Provider, MD  Multiple Vitamin (MULTI-VITAMINS) TABS Take 1 tablet by mouth daily. 06/11/11  Yes Historical Provider, MD  Oxycodone HCl 10 MG TABS Take 10 mg by mouth 4 (four) times daily as needed. For pain 11/09/14  Yes Historical Provider, MD  OxyCODONE HCl 20 MG/ML CONC Take 20 mg by mouth 4 (four) times daily. 11/23/14  Yes Malissa Hippo, MD  pantoprazole (PROTONIX) 40 MG tablet Take 1 tablet (40 mg total) by mouth 2 (two) times daily. 06/18/14  Yes Malissa Hippo, MD  polyethylene glycol (MIRALAX / GLYCOLAX) packet Take 17 g by mouth daily as needed for mild constipation or moderate constipation.  10/26/14  Yes Historical Provider, MD  potassium chloride (K-DUR,KLOR-CON) 10 MEQ tablet Take 20 mEq by mouth daily.   Yes Historical Provider,  MD  predniSONE (DELTASONE) 10 MG tablet Take 30 mg by mouth daily. 10/26/14  Yes Historical Provider, MD  Probiotic Product (ALIGN) 4 MG CAPS Take 4 mg by mouth daily.    Yes Historical Provider, MD  promethazine (PHENERGAN) 25 MG tablet Take 1 tablet (25 mg total) by mouth 2 (two) times daily as needed. nausea 11/27/14  Yes Malissa HippoNajeeb U Rehman, MD  sucralfate (CARAFATE) 1 G tablet Take 1 g by mouth 3 (three) times daily before meals.   Yes Historical Provider, MD  flintstones complete (FLINTSTONES) 60 MG chewable tablet Chew 1 tablet by mouth 2 (two) times daily. 11/27/14   Malissa HippoNajeeb U Rehman, MD    Current Facility-Administered Medications  Medication Dose Route Frequency Provider Last Rate Last Dose  . 0.9 %  sodium chloride infusion   Intravenous Continuous Rhetta MuraJai-Gurmukh Samtani, MD 75 mL/hr at 12/10/14 1127    . acetaminophen (TYLENOL) tablet 650 mg  650 mg Oral Q6H PRN Levie HeritageJacob J Stinson, DO       Or  . acetaminophen (TYLENOL) suppository 650 mg  650 mg Rectal Q6H PRN Levie HeritageJacob J Stinson, DO      . alum & mag hydroxide-simeth (MAALOX/MYLANTA) 200-200-20 MG/5ML suspension 30  mL  30 mL Oral Q6H PRN Rhona RaiderJacob J Stinson, DO      . carvedilol (COREG) tablet 6.25 mg  6.25 mg Oral BID WC Rhona RaiderJacob J Stinson, DO   6.25 mg at 12/10/14 0957  . dicyclomine (BENTYL) capsule 10 mg  10 mg Oral TID PRN Rhona RaiderJacob J Stinson, DO      . docusate sodium (COLACE) capsule 100 mg  100 mg Oral Daily PRN Rhona RaiderJacob J Stinson, DO      . enoxaparin (LOVENOX) injection 40 mg  40 mg Subcutaneous Q24H Rhona RaiderJacob J Stinson, DO   40 mg at 12/09/14 2132  . TPN (CLINIMIX-E) Adult   Intravenous Continuous TPN Rhetta MuraJai-Gurmukh Samtani, MD       And  . fat emulsion 20 % infusion 250 mL  250 mL Intravenous Continuous TPN Rhetta MuraJai-Gurmukh Samtani, MD      . feeding supplement (ENSURE COMPLETE) (ENSURE COMPLETE) liquid 237 mL  237 mL Oral BID BM Rhona RaiderJacob J Stinson, DO      . HYDROmorphone (DILAUDID) injection 1 mg  1 mg Intravenous Q2H PRN Rhona RaiderJacob J Stinson, DO   1 mg at 12/10/14 1127  . metoCLOPramide (REGLAN) tablet 5 mg  5 mg Oral TID PRN Rhona RaiderJacob J Stinson, DO      . ondansetron [facility](ZOFRAN) tablet 4 mg  4 mg Oral Q6H PRN Rhona RaiderJacob J Stinson, DO       Or  . ondansetron [facility](ZOFRAN) injection 4 mg  4 mg Intravenous Q6H PRN Rhona RaiderJacob J Stinson, DO      . oxyCODONE (ROXICODONE INTENSOL) 20 MG/ML concentrated solution 20 mg  20 mg Oral QID Rhetta MuraJai-Gurmukh Samtani, MD   20 mg at 12/10/14 0957  . pantoprazole (PROTONIX) EC tablet 40 mg  40 mg Oral BID Rhona RaiderJacob J Stinson, DO   40 mg at 12/10/14 [id number]  . predniSONE (DELTASONE) tablet 30 mg  30 mg Oral Daily Rhona RaiderJacob J Stinson, DO   30 mg at 12/10/14 [id number]  . promethazine (PHENERGAN) injection 25 mg  25 mg Intravenous Q6H PRN Rhona RaiderJacob J Stinson, DO   25 mg at 12/09/14 2147  . sucralfate (CARAFATE) tablet 1 g  1 g Oral TID AC Rhona RaiderJacob J Stinson, DO   1 g at 12/10/14 [id number]    Allergies as of 12/09/2014 -  Review Complete 12/09/2014  Allergen Reaction Noted  . Remicade [infliximab]  07/07/2013  . Fentanyl and related Itching 03/06/2014  . Alfentanil Itching 08/21/2014  . Wellbutrin [bupropion] Nausea And Vomiting 04/12/2013  .  Morphine and related Hives 07/03/2011    Family History  Problem Relation Age of Onset  . Diabetes Mother   . Diabetes Brother   . Healthy Daughter   . Healthy Son   . Colon cancer Neg Hx     History   Social History  . Marital Status: Married    Spouse Name: N/A    Number of Children: 2  . Years of Education: N/A   Occupational History  . disabled/retired    Social History Main Topics  . Smoking status: Light Tobacco Smoker -- 0.50 packs/day for 24 years    Types: Cigarettes    Start date: 10/28/2012    Last Attempt to Quit: 11/22/2012  . Smokeless tobacco: Current User    Types: Snuff     Comment: 1/2 pack a month  . Alcohol Use: No  . Drug Use: No  . Sexual Activity: Not Currently   Other Topics Concern  . Not on file     Review of Systems: As mentioned in HPI   Physical Exam: Vital signs in last 24 hours: Temp:  [97.6 F (36.4 C)-98.5 F (36.9 C)] 98.3 F (36.8 C) (12/28 0418) Pulse Rate:  [90-121] 90 (12/28 0418) Resp:  [10-20] 16 (12/28 0418) BP: (85-106)/(42-64) 85/42 mmHg (12/28 0418) SpO2:  [94 %-100 %] 98 % (12/28 0418) Weight:  [150 lb 13.4 oz (68.42 kg)-154 lb (69.854 kg)] 150 lb 13.4 oz (68.42 kg) (12/27 2112) Last BM Date: 12/08/14 General:   Alert,  Chronically ill-appearing Head:  Normocephalic and atraumatic. Eyes:  Sclera clear, no icterus.    Ears:  Normal auditory acuity. Nose:  No deformity, discharge,  or lesions. Mouth:  No deformity or lesions, dentition normal. Lungs:  Clear throughout to auscultation.   No wheezes, crackles, or rhonchi. No acute distress. Heart:  S1 S2 present without murmurs Abdomen:  Distended but soft, steri-strips intact midline, TTP diffusely but moreso in RUQ/RLQ, +BS. Difficult to appreciate HSM due to distension Rectal:  Deferred  Msk:  Symmetrical without gross deformities.  Extremities:  2-3+ pitting edema lower extremities to thigh, pedal edema. Neurologic:  Alert and  oriented x4;  grossly  normal neurologically. Psych:  Alert and cooperative. Normal mood and affect.  Intake/Output from previous day: 12/27 0701 - 12/28 0700 In: 670 [Blood:670] Out: -  Intake/Output this shift: Total I/O In: -  Out: 600 [Urine:600]  Lab Results:  Recent Labs  12/09/14 1342 12/10/14 0701  WBC 19.5* 9.5  HGB 7.1* 7.3*  HCT 22.4* 22.5*  PLT 199 107*   BMET  Recent Labs  12/09/14 1342 12/09/14 2025 12/10/14 0701  NA 121* 126* 125*  K 3.8 3.8 3.7  CL 81* 87* 91*  CO2 30 31 28   GLUCOSE 112* 99 109*  BUN 79* 71* 64*  CREATININE 1.51* 1.42* 1.26  CALCIUM 7.8* 7.7* 7.1*   LFT  Recent Labs  12/09/14 1342  PROT 5.0*  ALBUMIN 2.6*  AST 31  ALT 24  ALKPHOS 105  BILITOT 0.9   Studies/Results: Ct Abdomen Pelvis Wo Contrast  12/09/2014   CLINICAL DATA:  Weakness for 4 days with low-grade fever, history of Crohn's disease, recent gastrostomy placement  EXAM: CT ABDOMEN AND PELVIS WITHOUT CONTRAST  TECHNIQUE: Multidetector CT imaging of the abdomen and  pelvis was performed following the standard protocol without IV contrast.  COMPARISON:  03/07/2014, 10/15/2014  FINDINGS: Lung bases again demonstrate diffuse reticulonodular airspace disease particularly in the right lower lobe but to a lesser degree in the left lower lobe stable from the recent MRI examination.  Gallbladder has been surgically removed. The liver is stable in appearance. The spleen is enlarged in size but stable from the prior MRI examination. Stomach is significantly distended and a gastrostomy catheter is noted in place. The adrenal glands and pancreas are within normal limits. The kidneys are well visualized bilaterally and reveal some cystic change on the right. Fullness of the collecting systems is noted bilaterally although no definitive obstructive changes are seen. No ureteral calculi are seen. Mild ascites is noted.  There is some generalized distention of the small bowel without definitive obstructive change.  Changes are similar to that seen on the prior exam. Changes may be related to the patient's given clinical history of Crohn's disease. This appearance is stable from the prior exam bony structures are stable in appearance. The appendix is not visualized consistent with prior cholecystectomy.  IMPRESSION: Splenomegaly likely related to a degree of portal hypertension. This is stable from previous exams.  Gastrostomy catheter in place within a distended stomach.  Mild fullness of the collecting systems bilaterally although no true obstructive changes are seen.  Generalized prominence of the small bowel which is stable when compared with prior exams. This may be related to the patient's underlying Crohn's disease.  Mild ascites stable from the prior exam.   Electronically Signed   By: Alcide Clever M.D.   On: 12/09/2014 17:47   Dg Chest Portable 1 View  12/09/2014   CLINICAL DATA:  Decreased oral intake, generalized weakness  EXAM: PORTABLE CHEST - 1 VIEW  COMPARISON:  08/21/2014  FINDINGS: Cardiac shadow is stable. The left lung is clear. The right lung is well aerated but demonstrates some patchy infiltrative changes in the right lung base which may be related aspiration pneumonia. Clinical correlation is recommended.  IMPRESSION: New right basilar infiltrative change. This may represent aspiration pneumonia. Clinical correlation is recommended.   Electronically Signed   By: Alcide Clever M.D.   On: 12/09/2014 14:58    Impression: 37 year old male with history of complicated Crohn's disease with ileal stricture, cirrhosis, IDA, presenting with failure to thrive. Significant weight loss noted with decreased oral intake. Upcoming appointment in Jan 2016 at [facility] for consideration of liver/small bowel transplant.   Cirrhosis: follow-up on pending INR. Still with hyponatremia but improved from admission; diuretics on hold (as outpatient on Zaroxolyn 5 mg prn and Lasix 40 mg prn). [facility] liver  trans[facility]   Anemia: acute on chronic without overt GI bleeding. Agree with 2 units PRBCs. Known history of IDA multifactorial.   Failure to thrive: noted weight loss concerning. 12 F catheter placed to serve as a type of G-tube  for decompression but with concerns of needing a larger tube placed. Originally 42 F placed via IR at [facility]; may be able to change out with IR locally if necessary. PICC line ordered per hospitalist with TPN per pharmacy consult for nutritional purposes. May have sips of clear liquids. Weight noted as 205 in Nov 2015 at [facility], 163 Dec 15, and now 150.   Crohn's disease: complicated course. Failure of multiple medications in past to include 5 ASA, Imuran, methotrexate, Humira, Remicade, Cimzia; currently on Prednisone 30 mg daily. Continue Prednisone 30 mg  daily and close follow-up at [facility] and with Dr. Karilyn Cota.   Plan: PPI BID Prednisone 30 mg daily PICC line as soon as possible with TPN thereafter Supportive measures for pain control Carafate QID Sips of liquids Follow-up on pending INR Consider further evaluation of decompression catheter serving as G-tube via IR locally Keep appt Jan 8th with [facility] for initial consultation for consideration of liver/small bowel transplant  Will continue to follow with you in Dr. Patty Sermons absence.  Nira Retort, ANP-BC [facility]     LOS: 1 day    12/10/2014, 12:04 PM   Attending note:  Patient seen and examined. Chart reviewed. I have discussed this gentlemen's case with Dr. Karilyn Cota on numerous occasions in the last several weeks.  Very challenging case. He has a nonfunctioning GI tract at this time. He presents significantly malnourished with multiple electrolyte abnormalities.  TPN needed for nutritional support with no endpoint in site short of a small bowel transplant. In addition, the 12 [place] gastric tube placed at [facility] is simply not  large enough to do  the job. It has not vented the stomach adequately since placement.  He needs a larger bore tube. To this end, I have spoken to Dr. Mahala Menghini. I've also spoken to Dr. Elmon Kirschner, interventional radiologist in [place]. It is felt that this G-tube can easily be exchanged for a larger bore tube which would facilitate venting. Patient is agreeable. We will make plans for him to go down to [facility] and have this done tomorrow.  I would continue to use Reglan cautiously in this setting as patient is increased risk for mechanical bowel obstruction.

## 2014-12-11 ENCOUNTER — Ambulatory Visit (HOSPITAL_COMMUNITY)
Admission: EM | Admit: 2014-12-11 | Discharge: 2014-12-11 | Disposition: A | Discharge status: Home / Self Care | Payer: Medicare Other | Source: Home / Self Care | Attending: Family Medicine | Admitting: Family Medicine

## 2014-12-11 ENCOUNTER — Ambulatory Visit (HOSPITAL_COMMUNITY): Payer: Medicare Other

## 2014-12-11 DIAGNOSIS — K50918 Crohn's disease, unspecified, with other complication: Secondary | ICD-10-CM

## 2014-12-11 DIAGNOSIS — E43 Unspecified severe protein-calorie malnutrition: Secondary | ICD-10-CM | POA: Insufficient documentation

## 2014-12-11 DIAGNOSIS — K5090 Crohn's disease, unspecified, without complications: Secondary | ICD-10-CM | POA: Insufficient documentation

## 2014-12-11 LAB — COMPREHENSIVE METABOLIC PANEL
ALT: 20 U/L (ref 0–53)
AST: 25 U/L (ref 0–37)
Albumin: 2 g/dL — ABNORMAL LOW (ref 3.5–5.2)
Alkaline Phosphatase: 70 U/L (ref 39–117)
Anion gap: 6 (ref 5–15)
BILIRUBIN TOTAL: 0.7 mg/dL (ref 0.3–1.2)
BUN: 44 mg/dL — ABNORMAL HIGH (ref 6–23)
CO2: 26 mmol/L (ref 19–32)
CREATININE: 0.97 mg/dL (ref 0.50–1.35)
Calcium: 7.3 mg/dL — ABNORMAL LOW (ref 8.4–10.5)
Chloride: 94 mEq/L — ABNORMAL LOW (ref 96–112)
GFR calc Af Amer: >90 mL/min (ref >90)
GFR calc non Af Amer: >90 mL/min (ref >90)
Glucose, Bld: 139 mg/dL — ABNORMAL HIGH (ref 70–99)
Potassium: 3.8 mmol/L (ref 3.5–5.1)
Sodium: 126 mmol/L — ABNORMAL LOW (ref 135–145)
Total Protein: 4 g/dL — ABNORMAL LOW (ref 6.0–8.3)

## 2014-12-11 LAB — CBC WITH DIFFERENTIAL/PLATELET
Basophils Absolute: 0 10*3/uL (ref 0.0–0.1)
Basophils Relative: 0 % (ref 0–1)
EOS ABS: 0 10*3/uL (ref 0.0–0.7)
Eosinophils Relative: 1 % (ref 0–5)
HEMATOCRIT: 19.6 % — AB (ref 39.0–52.0)
HEMOGLOBIN: 6.4 g/dL — AB (ref 13.0–17.0)
Lymphocytes Relative: 14 % (ref 12–46)
Lymphs Abs: 0.6 10*3/uL — ABNORMAL LOW (ref 0.7–4.0)
MCH: 25.6 pg — AB (ref 26.0–34.0)
MCHC: 32.7 g/dL (ref 30.0–36.0)
MCV: 78.4 fL (ref 78.0–100.0)
MONO ABS: 0.6 10*3/uL (ref 0.1–1.0)
MONOS PCT: 13 % — AB (ref 3–12)
Neutro Abs: 3.3 10*3/uL (ref 1.7–7.7)
Neutrophils Relative %: 73 % (ref 43–77)
Platelets: 80 10*3/uL — ABNORMAL LOW (ref 150–400)
RBC: 2.5 MIL/uL — AB (ref 4.22–5.81)
RDW: 16.6 % — ABNORMAL HIGH (ref 11.5–15.5)
WBC: 4.6 10*3/uL (ref 4.0–10.5)

## 2014-12-11 LAB — PREPARE RBC (CROSSMATCH)

## 2014-12-11 LAB — PREALBUMIN: Prealbumin: 9.9 mg/dL — ABNORMAL LOW (ref 17.0–34.0)

## 2014-12-11 LAB — MAGNESIUM: Magnesium: 2.1 mg/dL (ref 1.5–2.5)

## 2014-12-11 LAB — PHOSPHORUS: Phosphorus: 3 mg/dL (ref 2.3–4.6)

## 2014-12-11 LAB — TRIGLYCERIDES: Triglycerides: 87 mg/dL (ref <150)

## 2014-12-11 LAB — PROTIME-INR
INR: 1.33 (ref 0.00–1.49)
Prothrombin Time: 16.7 seconds — ABNORMAL HIGH (ref 11.6–15.2)

## 2014-12-11 MED ORDER — FAT EMULSION 20 % IV EMUL
250.0000 mL | INTRAVENOUS | Status: AC
  Administered 2014-12-11: 250 mL via INTRAVENOUS
  Filled 2014-12-11: qty 250

## 2014-12-11 MED ORDER — FENTANYL CITRATE 0.05 MG/ML IJ SOLN
INTRAMUSCULAR | Status: AC
  Filled 2014-12-11: qty 4

## 2014-12-11 MED ORDER — FENTANYL CITRATE 0.05 MG/ML IJ SOLN
INTRAMUSCULAR | Status: AC | PRN
  Administered 2014-12-11: 50 ug via INTRAVENOUS

## 2014-12-11 MED ORDER — ONDANSETRON HCL 4 MG/2ML IJ SOLN: 4.0000 mg | INTRAMUSCULAR | Status: DC | PRN

## 2014-12-11 MED ORDER — HYDROMORPHONE HCL 1 MG/ML IJ SOLN
1.0000 mg | INTRAMUSCULAR | Status: DC | PRN
  Administered 2014-12-11 (×2): 1 mg via INTRAVENOUS
  Filled 2014-12-11: qty 1

## 2014-12-11 MED ORDER — SODIUM CHLORIDE 0.9 % IV SOLN
INTRAVENOUS | Status: DC
  Administered 2014-12-11: via INTRAVENOUS

## 2014-12-11 MED ORDER — HYDROMORPHONE HCL 1 MG/ML IJ SOLN
1.5000 mg | INTRAMUSCULAR | Status: DC | PRN
  Administered 2014-12-11 – 2014-12-13 (×16): 1.5 mg via INTRAVENOUS
  Filled 2014-12-11 (×16): qty 2

## 2014-12-11 MED ORDER — MIDAZOLAM HCL 2 MG/2ML IJ SOLN
INTRAMUSCULAR | Status: AC | PRN
  Administered 2014-12-11: 2 mg via INTRAVENOUS
  Administered 2014-12-11: 1 mg via INTRAVENOUS

## 2014-12-11 MED ORDER — DIPHENHYDRAMINE HCL 50 MG/ML IJ SOLN: 25.0000 mg | INTRAMUSCULAR | Status: DC | PRN

## 2014-12-11 MED ORDER — VITAL AF 1.2 CAL PO LIQD
1000.0000 mL | ORAL | Status: DC
  Administered 2014-12-11: 1000 mL
  Filled 2014-12-11: qty 1000

## 2014-12-11 MED ORDER — SODIUM CHLORIDE 1 G PO TABS
1.0000 g | ORAL_TABLET | Freq: Two times a day (BID) | ORAL | Status: DC
  Administered 2014-12-11 – 2014-12-14 (×7): 1 g via ORAL
  Filled 2014-12-11 (×12): qty 1

## 2014-12-11 MED ORDER — TRACE MINERALS CR-CU-F-FE-I-MN-MO-SE-ZN IV SOLN
INTRAVENOUS | Status: AC
  Administered 2014-12-11: 17:00:00 via INTRAVENOUS
  Filled 2014-12-11: qty 2000

## 2014-12-11 MED ORDER — IOHEXOL 300 MG/ML  SOLN
25.0000 mL | Freq: Once | INTRAMUSCULAR | Status: AC | PRN
Start: 2014-12-11 — End: 2014-12-11
  Administered 2014-12-11: 10 mL via INTRAVENOUS

## 2014-12-11 MED ORDER — MIDAZOLAM HCL 2 MG/2ML IJ SOLN
INTRAMUSCULAR | Status: AC
  Filled 2014-12-11: qty 4

## 2014-12-11 MED ORDER — VITAL AF 1.2 CAL PO LIQD: 1000.0000 mL | ORAL | Status: DC

## 2014-12-11 NOTE — Progress Notes (Signed)
Subjective: Still with abdominal pain, chronic at baseline. Requesting higher dosage for breakthrough pain. States nausea worsened with pain. No vomiting. Possible replacement of G tube today via IR; still pending on exact time.   Objective: Vital signs in last 24 hours: Temp:  [98.1 F (36.7 C)-98.3 F (36.8 C)] 98.3 F (36.8 C) (12/29 0553) Pulse Rate:  [74-82] 74 (12/29 0553) Resp:  [16] 16 (12/29 0553) BP: (82-85)/(41-48) 85/41 mmHg (12/29 0553) SpO2:  [100 %] 100 % (12/29 0553) Last BM Date: 12/09/14 General:   Alert and oriented, pleasant Head:  Normocephalic and atraumatic. Abdomen:  Bowel sounds present, distended but soft, 12 F blue catheter function as G tube for drainage attached to low wall suction.  Extremities:  Some improvement from yesterday, 2+ pitting lower extremity edema Neurologic:  Alert and  oriented x4;  grossly normal neurologically. Skin:  Warm and dry, intact without significant lesions.  .  Intake/Output from previous day: 12/28 0701 - 12/29 0700 In: 3025.7 [I.V.:2327.5; TPN:698.2] Out: 1350 [Urine:1350] Intake/Output this shift:    Lab Results:  Recent Labs  12/10/14 0701 12/10/14 1130 12/11/14 0450  WBC 9.5 8.7 4.6  HGB 7.3* 7.5* 6.4*  HCT 22.5* 23.1* 19.6*  PLT 107* 105* 80*   BMET  Recent Labs  12/10/14 1713 12/10/14 2222 12/11/14 0450  NA 128* 127* 126*  K 4.5 4.5 3.8  CL 97 94* 94*  CO2 25 26 26   GLUCOSE 117* 142* 139*  BUN 55* 52* 44*  CREATININE 1.11 1.16 0.97  CALCIUM 6.7* 7.2* 7.3*   LFT  Recent Labs  12/09/14 1342 12/11/14 0450  PROT 5.0* 4.0*  ALBUMIN 2.6* 2.0*  AST 31 25  ALT 24 20  ALKPHOS 105 70  BILITOT 0.9 0.7   PT/INR  Recent Labs  12/10/14 1130 12/11/14 0450  LABPROT 18.5* 16.7*  INR 1.53* 1.33     Studies/Results: Ct Abdomen Pelvis Wo Contrast  12/09/2014   CLINICAL DATA:  Weakness for 4 days with low-grade fever, history of Crohn's disease, recent gastrostomy placement  EXAM: CT  ABDOMEN AND PELVIS WITHOUT CONTRAST  TECHNIQUE: Multidetector CT imaging of the abdomen and pelvis was performed following the standard protocol without IV contrast.  COMPARISON:  03/07/2014, 10/15/2014  FINDINGS: Lung bases again demonstrate diffuse reticulonodular airspace disease particularly in the right lower lobe but to a lesser degree in the left lower lobe stable from the recent MRI examination.  Gallbladder has been surgically removed. The liver is stable in appearance. The spleen is enlarged in size but stable from the prior MRI examination. Stomach is significantly distended and a gastrostomy catheter is noted in place. The adrenal glands and pancreas are within normal limits. The kidneys are well visualized bilaterally and reveal some cystic change on the right. Fullness of the collecting systems is noted bilaterally although no definitive obstructive changes are seen. No ureteral calculi are seen. Mild ascites is noted.  There is some generalized distention of the small bowel without definitive obstructive change. Changes are similar to that seen on the prior exam. Changes may be related to the patient's given clinical history of Crohn's disease. This appearance is stable from the prior exam bony structures are stable in appearance. The appendix is not visualized consistent with prior cholecystectomy.  IMPRESSION: Splenomegaly likely related to a degree of portal hypertension. This is stable from previous exams.  Gastrostomy catheter in place within a distended stomach.  Mild fullness of the collecting systems bilaterally although no true obstructive  changes are seen.  Generalized prominence of the small bowel which is stable when compared with prior exams. This may be related to the patient's underlying Crohn's disease.  Mild ascites stable from the prior exam.   Electronically Signed   By: Alcide CleverMark  Lukens M.D.   On: 12/09/2014 17:47   Dg Chest Portable 1 View  12/09/2014   CLINICAL DATA:  Decreased  oral intake, generalized weakness  EXAM: PORTABLE CHEST - 1 VIEW  COMPARISON:  08/21/2014  FINDINGS: Cardiac shadow is stable. The left lung is clear. The right lung is well aerated but demonstrates some patchy infiltrative changes in the right lung base which may be related aspiration pneumonia. Clinical correlation is recommended.  IMPRESSION: New right basilar infiltrative change. This may represent aspiration pneumonia. Clinical correlation is recommended.   Electronically Signed   By: Alcide CleverMark  Lukens M.D.   On: 12/09/2014 14:58    Assessment: 38 year old male with history of complicated Crohn's disease with ileal stricture, cirrhosis, IDA, presenting with failure to thrive. Significant weight loss noted with decreased oral intake. [facility] Jan 2016 [facility] for [facility] of liver/small bowel transplant.   Cirrhosis: MELD score 10. Still with hyponatremia but improved from admission; diuretics on hold (as outpatient on Zaroxolyn 5 mg prn and Lasix 40 mg prn). Consideration for liver transplantation with app[facility] at [facility] upcoming.   Anemia: acute on chronic without overt GI bleeding. 2 units received this admission thus far without significant change in Hgb. Now with Hgb down to 6.4. 2 additional units PRBCs ordered. Hemoccults ordered. CT on file from 12/27 at time of admission. Last EGD in March 2015 and recent colonoscopy Aug 2015.   Failure to thrive: noted weight loss concerning. 12 F catheter placed to serve as a type of G-tube for decompression but with concerns of needing a larger tube placed. Originally 5312 F [facility]; IR in [place] has been contacted to proceed with G tube placement for decompression. However, with need for PRBCs, will likely be on hold until 12/30.  [facility] as [facility], 163 Dec 15, and now 150.   Crohn's disease: complicated course. Failure of multiple medications in past to include 5 ASA, Imuran, methotrexate, Humira,  Remicade, Cimzia; currently on Prednisone 30 mg daily. Continue Prednisone 30 mg daily with PPI BID and close [facility] and with Dr. Karilyn Cotaehman.   Plan: 2 units PRBCs now Hemoccult stools TPN per pharmacy Pain control: will trial increase of prn pain medications to dilaudid 1.5 mg instead of 1 mg. Discussed with nursing PPI BID Monitor for overt GI bleeding PEG placement via IR in process of being scheduled   LOS: 2 days    12/11/2014, 8:22 AM    Addendum at 1000: [facility] will be performing G tube exchange/placement. [facility] has already called and confirmed with [facility]. Anticipated time 11am.  Nira RetortAnna W. Sams, ANP-BC [facility]

## 2014-12-11 NOTE — Progress Notes (Signed)
Patient transported to [facility] via [employer] [facility]. Report given to Tyrone Apple, RN.

## 2014-12-11 NOTE — Procedures (Signed)
54F gastrostomy tube placement under fluoro via prior tract No complication No blood loss. See complete dictation in [facility].

## 2014-12-11 NOTE — Progress Notes (Signed)
INITIAL NUTRITION ASSESSMENT  DOCUMENTATION CODES Per approved criteria  -Severe malnutrition in the context of chronic illness   Pt meets criteria for severe MALNUTRITION in the context of chronic illness as evidenced by <75% of estimated enerfy intake >1 month, 22% wt loss x 3 months.  INTERVENTION: -TPN per pharmacy -When initiation of TF is deemed feasible, recommend:  -Initiate Vital 1.2 @ 25 ml/hr via PEG and increase by 10  ml every 12 hours to goal rate of 75 ml/hr.   -Tube feeding regimen provides 2160 kcal (100% of  needs), 135 grams of protein, and 1460 ml of H2O.   -RD to follow for diet advancement -Recommend monitor Mg, K, and Phos closely due to pt's high risk of developing refeeding symdrome  NUTRITION DIAGNOSIS: Inadequate oral intake related to altered GI function as evidenced by NPO.   Goal: Pt will meet >90% of estimated nutritional needs  Monitor:  TPN tolerance, transition to PO diet/enteral feedings, labs, weight changes, I/O's  Reason for Assessment: MST=5, New TPN  38 y.o. male  Admitting Dx: <principal problem not specified>  David [name] is a 38 y.o. male with a history of Crohn disease with fistula, Stage III fatty liver fibrosis and chronic liver disease, anemia, hypertension, failure to thrive, on chronic steroids. Patient underwent exploratory laparotomy at [facility] on 11/05/2014 with plans of lysis adhesions and possible bowel resection, but surgery was never able to completely enter the abdomen due to frozen abdomen. Patient has a gastrostomy tube placed percutaneously. Over the past several weeks, the patient's abdominal pain has been increasing. Patient has also been increasingly intolerant of oral foods and liquids. Due to weakness, lightheadedness and dizziness while in relating, the patient presented the hospital for evaluation.  ASSESSMENT: Pt with longstanding hx of Crohn's disease, s/p ex lap at [facility] on 11/05/14. Also  s/p PEG placement, which is being used for decompression. He is currently on the bowel and liver transplant list due to hepatitis from Chron's medication use.  Pt with poor PO intake PTA. He was eating a soft diet at home, with decreased tolerance for foods. Additionally, pt has experienced significant weight loss over the past year, due to prolonged poor PO intake, including a 43# (22%) wt loss over the past 3 months. UBW around 220#, which pt last weighed approximately one year ago.  Pt is  NPO, receiving TPN via PICC. Current regimen of Clinimix E 5/15 at 4460ml/hr with lipids, trace elements and MVI providing 1502 kcals and 72 grams protein, which meets 72% of minimum estimated energy needs and 75% of minimum estimated protein needs.  Nutrition-focused physical exam deferred at this time. Pt unavailable at time of visit, as he is being [facility] to PEG exchange.  Labs reviewed. Na: 126, Cl: 94, BUN: 44, Calcium: 7.3, Glucose: 139. Mg, K, and Phos WDL.   Height: Ht Readings from Last 1 Encounters:  12/09/14 5\' 11"  (1.803 m)    Weight: Wt Readings from Last 1 Encounters:  12/09/14 150 lb 13.4 oz (68.42 kg)    Ideal Body Weight: 172#  % Ideal Body Weight: 87%  Wt Readings from Last 10 Encounters:  12/09/14 150 lb 13.4 oz (68.42 kg)  11/27/14 163 lb 4.8 oz (74.072 kg)  10/15/14 195 lb (88.451 kg)  09/26/14 211 lb 8 oz (95.936 kg)  08/21/14 193 lb 6.4 oz (87.726 kg)  06/18/14 193 lb 6.4 oz (87.726 kg)  05/14/14 214 lb 3.2 oz (97.16 kg)  03/09/14 229 lb (103.874 kg)  03/06/14 230 lb 3.2 oz (104.418 kg)  11/06/13 220 lb 6.4 oz (99.973 kg)    Usual Body Weight: 220#  % Usual Body Weight: 68%  BMI:  Body mass index is 21.05 kg/(m^2). Normal weight range  Estimated Nutritional Needs: Kcal: 2100-2300 Protein: 96-106 grams Fluid: 2.1-2.3 L  Skin: surgical incision to mid abdomen, PEG  Diet Order: TPN (CLINIMIX-E) Adult TPN (CLINIMIX-E) Adult  EDUCATION  NEEDS: -Education not appropriate at this time   Intake/Output Summary (Last 24 hours) at 12/11/14 1201 Last data filed at 12/11/14 0949  Gross per 24 hour  Intake 2131.92 ml  Output   1100 ml  Net 1031.92 ml    Last BM: 12/10/14  Labs:   Recent Labs Lab 12/04/14 1222  12/10/14 1713 12/10/14 2222 12/11/14 0450  NA 125*  < > 128* 127* 126*  K 4.7  < > 4.5 4.5 3.8  CL 82*  < > 97 94* 94*  CO2 27  < > 25 26 26   BUN 46*  < > 55* 52* 44*  CREATININE 1.62*  < > 1.11 1.16 0.97  CALCIUM 9.4  < > 6.7* 7.2* 7.3*  MG 2.3  --   --   --  2.1  PHOS  --   --   --   --  3.0  GLUCOSE 123*  < > 117* 142* 139*  < > = values in this interval not displayed.  CBG (last 3)  No results for input(s): GLUCAP in the last 72 hours.  Scheduled Meds: . carvedilol  6.25 mg Oral BID WC  . feeding supplement (ENSURE COMPLETE)  237 mL Oral BID BM  . oxyCODONE  20 mg Oral QID  . pantoprazole  40 mg Oral BID  . predniSONE  30 mg Oral Daily  . sodium chloride  10-40 mL Intracatheter Q12H  . sodium chloride  1 g Oral BID WC  . sucralfate  1 g Oral TID AC    Continuous Infusions: . sodium chloride    . Marland KitchenTPN (CLINIMIX-E) Adult 50 mL/hr at 12/10/14 1848   And  . fat emulsion 250 mL (12/10/14 1847)  . Marland KitchenTPN (CLINIMIX-E) Adult     And  . fat emulsion      Past Medical History  Diagnosis Date  . 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 Hepatomegaly 05/04/2011  . Fatty liver 05/04/2011    "stage III fatty liver fibrosis" (11/15/2012)  . GERD (gastroesophageal reflux disease)   . Chronic liver disease     /notes 11/15/2012  . Pericarditis     [name] 11/15/2012  . Hypertension   . Pneumonia 1977  . Shortness of breath     "all the time right now" (11/15/2012)  . History of blood transfusion 2004  . History of stomach ulcers   . Duodenal ulcer   . Depression   . Kidney stones     bilaterally/notes 11/15/2012   . Hepatic fibrosis     [name] 11/15/2012  . Anemia   . Pericardial effusion 10/29/2012    moderate to large/notes 11/15/2012  . Anxiety   . Hepatitis   . Crohn's disease   . ED (erectile dysfunction)   . 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 Anal examination under anesthesia  02/11/2011  [name]  . Treatment fistula anal  07/03/11    This was a second surgery to repair Anal fistula  . Gastrojejunostomy  2004    "had hole cut in small intestines during endoscopy; had to have OR & leave me open 3 months" (11/15/2012)  . Cholecystectomy  ~ 2003  . Appendectomy  ~ 2003  . Video assisted thoracoscopy  11/17/2012    Procedure: VIDEO ASSISTED THORACOSCOPY;  Surgeon: Loreli Slot, MD;  Location: [facility] OR;  Service: Thoracic;  Laterality: Left;  drainage of left pleural effusion  . Pericardial window  11/17/2012    Procedure: PERICARDIAL WINDOW;  Surgeon: Loreli Slot, MD;  Location: [facility] OR;  Service: Thoracic;  Laterality: N/A;  . Esophagogastroduodenoscopy  01/06/2013    Procedure: ESOPHAGOGASTRODUODENOSCOPY (EGD);  Surgeon: Malissa Hippo, MD;  Location[facility] ENDO SUITE;  Service: Endoscopy;  Laterality: N/A;  11:15  . Esophagogastroduodenoscopy N/A 03/09/2014    Dr. Karilyn Cota: erosive/ulcerative reflux esophagiits, portal gastropathy, moderate amount of bile and food debris in stomach precluding visualization of GJ anastomosis  . Gastrostomy tube placement  11/19/14    [facility]: 1 F APDL catheter. Checked again 12/23 via fluoroscopy and in suitable position  . Colonoscopy  Aug 2015    Dr. Edyth Gunnels at [facility]: congested mucosa in entire colon, solitary ulcer distal ileum, stricture in ileum 5 cm from IC valve s/p dilation. Surveillance in 1 year  . Exploratory laparotomy  Nov 05, 2014    [facility], Dr. Byrd Hesselbach. Several hour operation with unsuccessful lysis of adhesions due to frozen abdomen    Melainie Krinsky A.  Mayford Knife, RD, LDN Pager: [phone] After hours Pager: [phone]

## 2014-12-11 NOTE — Clinical Documentation Improvement (Signed)
Supporting Information: Patient with symptomatic anemia, will transfuse 2units PRBC per 12/27 progress notes. Low blood pressure and Hgb of 6.4, for transfusion today per 12/29 progress notes.    Possible Clinical Condition: . Documentation of Anemia should include the type of anemia: --Acute Blood Loss Anemia --Chronic Blood Loss Anemia --Acute on Chronic Blood Loss Anemia --Other (please specify) . Include in documentation if Anemia is due to nutrition or mineral deficits, resulting in a nutritional anemia . Document if the Anemia is due to a neoplasm (primary and/or secondary)    Thank You, Debria Garret Documentation Specialist [phone] [email or web address]

## 2014-12-11 NOTE — Progress Notes (Signed)
David [name] UJW:[id number] DOB: March 24, 1976 DOA: 12/09/2014 PCP: Harlow AsaLUKING,W S, MD  Brief narrative: 38 y/o ? 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 Over that period of time he progressively got weaker and weaker and felt more ill or nauseous and was unable to keep down much by mouth he His last meal was yesterday and was grits in the morning and he only had 4-5 bites. Of note he has been taking relatively large doses of Roxanol 20 mg 4 times a day which has helped his pain  Past medical history-As per Problem list Chart reviewed as below-   Consultants:  GI  Procedures:  none  Antibiotics:  none   Subjective   Better Feels better Tolerating ensure Still feels a little bloated Abdominal pain is moderate  No stool as yet    Objective    Interim History: None  Telemetry: None telemetry   Objective: Filed Vitals:   12/11/14 1356 12/11/14 1520 12/11/14 1613 12/11/14 1645  BP: 84/52 101/60 94/64 90/58   Pulse: 78 81 80 79  Temp:  97.3 F (36.3 C) 97.8 F (36.6 C) 98.5 F (36.9 C)  TempSrc:  Oral Oral Oral  Resp: 12 12 16 14   Height:      Weight:      SpO2: 100% 100% 100% 100%    Intake/Output Summary (Last 24 hours) at 12/11/14 1705 Last data filed at 12/11/14 1630  Gross per 24 hour  Intake 2845.92 ml  Output   1600 ml  Net 1245.92 ml    Exam:  General: EOMI NCAT, temporalis and supraclavicular fossa wasting Cardiovascular: S1-S2 no murmur rub  or gallop Respiratory: Clinically clear no added sound Abdomen: Soft but distended, staples in the midline appeared to be well opposed, feeding tube appears to be backed up but does not appear to have any overt infection there is no shifting dullness am not able to appreciate any organomegaly Skin none Neuro intact  Data Reviewed: Basic Metabolic Panel:  Recent Labs Lab 12/10/14 0701 12/10/14 1130 12/10/14 1713 12/10/14 2222 12/11/14 0450  NA 125* 126* 128* 127* 126*  K 3.7 3.8 4.5 4.5 3.8  CL 91* 92* 97 94* 94*  CO2 28 28 25 26 26   GLUCOSE 109* 98 117* 142* 139*  BUN 64* 60* 55* 52* 44*  CREATININE 1.26 1.23 1.11 1.16 0.97  CALCIUM 7.1* 7.2* 6.7* 7.2* 7.3*  MG  --   --   --   --  2.1  PHOS  --   --   --   --  3.0   Liver Function Tests:  Recent Labs Lab 12/09/14 1342 12/11/14 0450  AST 31 25  ALT 24 20  ALKPHOS 105 70  BILITOT 0.9 0.7  PROT 5.0* 4.0*  ALBUMIN 2.6* 2.0*   No results for input(s): LIPASE, AMYLASE in the last 168 hours. No results for input(s): AMMONIA in the last 168 hours. CBC:  Recent Labs Lab 12/09/14 1342 12/10/14 0701 12/10/14  1130 12/11/14 0450  WBC 19.5* 9.5 8.7 4.6  NEUTROABS 16.7*  --  7.6 3.3  HGB 7.1* 7.3* 7.5* 6.4*  HCT 22.4* 22.5* 23.1* 19.6*  MCV 77.0* 78.7 78.3 78.4  PLT 199 107* 105* 80*   Cardiac Enzymes: No results for input(s): CKTOTAL, CKMB, CKMBINDEX, TROPONINI in the last 168 hours. BNP: Invalid input(s): POCBNP CBG: No results for input(s): GLUCAP in the last 168 hours.  Recent Results (from the past 240 hour(s))  Blood culture (routine x 2)     Status: None (Preliminary result)   Collection Time: 12/09/14  5:36 PM  Result Value Ref Range Status   Specimen Description RIGHT ANTECUBITAL  Final   Special Requests BOTTLES DRAWN AEROBIC AND ANAEROBIC  Final   Culture NO GROWTH 2 DAYS  Final   Report Status PENDING  Incomplete  Blood culture (routine x 2)     Status: None (Preliminary result)   Collection Time:  12/09/14  5:45 PM  Result Value Ref Range Status   Specimen Description BLOOD LEFT HAND  Final   Special Requests BOTTLES DRAWN AEROBIC ONLY  Final   Culture NO GROWTH 2 DAYS  Final   Report Status PENDING  Incomplete     Studies:              All Imaging reviewed and is as per above notation   Scheduled Meds: . carvedilol  6.25 mg Oral BID WC  . feeding supplement (ENSURE COMPLETE)  237 mL Oral BID BM  . oxyCODONE  20 mg Oral QID  . pantoprazole  40 mg Oral BID  . predniSONE  30 mg Oral Daily  . sodium chloride  10-40 mL Intracatheter Q12H  . sodium chloride  1 g Oral BID WC  . sucralfate  1 g Oral TID AC   Continuous Infusions: . sodium chloride    . Marland KitchenTPN (CLINIMIX-E) Adult 50 mL/hr at 12/10/14 1848   And  . fat emulsion 250 mL (12/10/14 1847)  . Marland KitchenTPN (CLINIMIX-E) Adult     And  . fat emulsion       Assessment/Plan: 1. Crohn's disease-[facility], has been seen at [facility] in the past-sees Dr. Karilyn Cota in [place] every month for maintenance.  Agree with TPN. Order placed for PICC line. Appreciate gastroenterology input Dr. Kendell Bane. Continue prednisone 30 mg daily 2. Likely anemia of chronic disease with superimposed acute component -transfused 2 units packed red blood cells on 12/28 +12/29. Note that patient also has cirrhosis and is malnourished which may be the etiology of this. Unfortunately anemia panel was not done prior to transfusion .  Hemoccults pending-last scoped as per above-further GI tract eval as per GI  3. Severe protein energy malnutrition with wasting and hyponatremia-patient had an to feeds initiated with vital 1.2 at 25 miles per hour to increase as per protocol. We'll monitor magnesium phosphorus and potassium secondary to high risk for refeeding syndrome. 4. Acute hyponatremia-probably secondary to poor solute intake with liberalized by mouth water-would replace sodium at 50 cc per hour instead of 125 cc per hour to  prevent theoretical central pontine myelinolysis. I have started salt tablets 12/29 Repeat labs every 8 hours 5. 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 He will need to be managed holistically by gastroenterology.I will administer a small dose Lasix 20 mg IV for aquaresis. He is to continue Coreg although may  benefit from a nonselective beta blocker for secondary variceal bleed prevention 6. Gastroparesis?-Continue Reglan 5 mg 3 times a day,Probably not helped by chronic opiate meds 7. Reflux continue pantoprazole 40 twice a day , continue sucralfate 1 g 3 times a day   Code Status: full Family Communication:  Discussed with his mother and father at the bedside and patient  Disposition Plan: inpatient   Pleas Koch, MD  [facility] Pager [phone] 12/11/2014, 5:05 PM    LOS: 2 days

## 2014-12-11 NOTE — H&P (Signed)
Chief Complaint: "I'm here for a gastric tube exchange" Referring Physician:Rourk HPI: David [name] is an 38 y.o. male with severe Crohn's disease with essentially a non-functional GI tract. He developed marked gastric distention and had a 12Fr decompression pigtail catheter placed at [facility] about 3 weeks ago. Report reviewed. However, this is not functioning well. He was supposed to have this exchange at [facility] but his overall condition worsened and he was admitted to [facility]. They have reached out to our IR team here to see if exchange can be performed. Chart, PMHx, meds, labs, imaging reviewed Lovenox has been held past 24 hours. Pt receiving TPN via PICC  Past Medical History:  Past Medical History  Diagnosis Date  . 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 Hepatomegaly 05/04/2011  . Fatty liver 05/04/2011    "stage III fatty liver fibrosis" (11/15/2012)  . GERD (gastroesophageal reflux disease)   . Chronic liver disease     /notes 11/15/2012  . Pericarditis     Archie Endo 11/15/2012  . Hypertension   . Pneumonia 1977  . Shortness of breath     "all the time right now" (11/15/2012)  . History of blood transfusion 2004  . History of stomach ulcers   . Duodenal ulcer   . Depression   . Kidney stones     bilaterally/notes 11/15/2012  . Hepatic fibrosis     Archie Endo 11/15/2012  . Anemia   . Pericardial effusion 10/29/2012    moderate to large/notes 11/15/2012  . Anxiety   . Hepatitis   . Crohn's disease   . ED (erectile dysfunction)   . 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 Anal examination under anesthesia  02/11/2011    [name]  . Treatment fistula anal  07/03/11    This was a second surgery to repair Anal fistula  . Gastrojejunostomy  2004    "had hole cut in small intestines during endoscopy; had to  have OR & leave me open 3 months" (11/15/2012)  . Cholecystectomy  ~ 2003  . Appendectomy  ~ 2003  . Video assisted thoracoscopy  11/17/2012    Procedure: VIDEO ASSISTED THORACOSCOPY;  Surgeon: Melrose Nakayama, MD;  Location: [place];  Service: Thoracic;  Laterality: Left;  drainage of left pleural effusion  . Pericardial window  11/17/2012    Procedure: PERICARDIAL WINDOW;  Surgeon: Melrose Nakayama, MD;  Location: [place];  Service: Thoracic;  Laterality: N/A;  . Esophagogastroduodenoscopy  01/06/2013    Procedure: ESOPHAGOGASTRODUODENOSCOPY (EGD);  Surgeon: Rogene Houston, MD;  [facility];  Service: Endoscopy;  Laterality: N/A;  11:15  . Esophagogastroduodenoscopy N/A 03/09/2014    Dr. Laural Golden: erosive/ulcerative reflux esophagiits, portal gastropathy, moderate amount of bile and food debris in stomach precluding visualization of GJ anastomosis  . Gastrostomy tube placement  11/19/14    [facility]: 34 F APDL catheter. Checked again 12/23 via fluoroscopy and in suitable position  . Colonoscopy  Aug 2015    Dr. Lysle Rubens at [facility]: congested mucosa in entire colon, solitary ulcer distal ileum, stricture in ileum 5 cm from IC valve s/p dilation. Surveillance in 1 year  . Exploratory laparotomy  Nov 05, 2014    [facility], Dr. Morton Stall. Several hour operation with unsuccessful lysis of adhesions due to frozen abdomen  Family History:  Family History  Problem Relation Age of Onset  . Diabetes Mother   . Diabetes Brother   . Healthy Daughter   . Healthy Son   . Colon cancer Neg Hx     Social History:  reports that he has been smoking Cigarettes.  He started smoking about 2 years ago. He has a 12 pack-year smoking history. His smokeless tobacco use includes Snuff. He reports that he does not drink alcohol or use illicit drugs.  Allergies:  Allergies  Allergen Reactions  . Remicade [Infliximab]     Dr Karilyn Cota states previous respiratory arrest not related to remicade. Dr Karilyn Cota  states remicade not a drug related allergy. 07-07-2013 at 1025 rapid response called to PACU- Patient difficulty breathing after infusion of Remicade infusing. Do NOT Give Remicade!  . Fentanyl And Related Itching    Swelling, Redness  . Alfentanil Itching    Swelling, Redness  . Wellbutrin [Bupropion] Nausea And Vomiting  . Morphine And Related Hives    Medications: Current facility-administered medications: 0.9 %  sodium chloride infusion, , Intravenous, Continuous, Rhetta Mura, MD, Last Rate: 75 mL/hr at 12/11/14 0057;  acetaminophen (TYLENOL) tablet 650 mg, 650 mg, Oral, Q6H PRN **OR** acetaminophen (TYLENOL) suppository 650 mg, 650 mg, Rectal, Q6H PRN, Rhona Raider Stinson, DO;  alum & mag hydroxide-simeth (MAALOX/MYLANTA) 200-200-20 MG/5ML suspension 30 mL, 30 mL, Oral, Q6H PRN, Rhona Raider Stinson, DO carvedilol (COREG) tablet 6.25 mg, 6.25 mg, Oral, BID WC, Rhona Raider Stinson, DO, 6.25 mg at 12/11/14 4965;  dicyclomine (BENTYL) capsule 10 mg, 10 mg, Oral, TID PRN, Rhona Raider Stinson, DO;  docusate sodium (COLACE) capsule 100 mg, 100 mg, Oral, Daily PRN, Rhona Raider Stinson, DO TPN (CLINIMIX-E) Adult, , Intravenous, Continuous TPN, Last Rate: 50 mL/hr at 12/10/14 1848 **AND** fat emulsion 20 % infusion 250 mL, 250 mL, Intravenous, Continuous TPN, Rhetta Mura, MD, Last Rate: 10 mL/hr at 12/10/14 1847, 250 mL at 12/10/14 1847;  feeding supplement (ENSURE COMPLETE) (ENSURE COMPLETE) liquid 237 mL, 237 mL, Oral, BID BM, Rhona Raider Stinson, DO, 237 mL at 12/10/14 1846 HYDROmorphone (DILAUDID) injection 1 mg, 1 mg, Intravenous, Q2H PRN, Rhona Raider Stinson, DO, 1 mg at 12/11/14 0848;  metoCLOPramide (REGLAN) tablet 5 mg, 5 mg, Oral, TID PRN, Rhona Raider Stinson, DO;  ondansetron (ZOFRAN) tablet 4 mg, 4 mg, Oral, Q6H PRN **OR** ondansetron (ZOFRAN) injection 4 mg, 4 mg, Intravenous, Q6H PRN, Rhona Raider Stinson, DO oxyCODONE (ROXICODONE INTENSOL) 20 MG/ML concentrated solution 20 mg, 20 mg, Oral, QID, Rhetta Mura, MD, 20 mg at 12/11/14 0848;  pantoprazole (PROTONIX) EC tablet 40 mg, 40 mg, Oral, BID, Rhona Raider Stinson, DO, 40 mg at 12/11/14 0848;  predniSONE (DELTASONE) tablet 30 mg, 30 mg, Oral, Daily, Rhona Raider Stinson, DO, 30 mg at 12/11/14 0847 promethazine (PHENERGAN) injection 25 mg, 25 mg, Intravenous, Q6H PRN, Rhona Raider Stinson, DO, 25 mg at 12/09/14 2147;  sodium chloride 0.9 % injection 10-40 mL, 10-40 mL, Intracatheter, Q12H, Rhetta Mura, MD, 10 mL at 12/10/14 2156;  sodium chloride 0.9 % injection 10-40 mL, 10-40 mL, Intracatheter, PRN, Rhetta Mura, MD;  sodium chloride tablet 1 g, 1 g, Oral, BID WC, Rhetta Mura, MD sucralfate (CARAFATE) tablet 1 g, 1 g, Oral, TID AC, Rhona Raider Stinson, DO, 1 g at 12/11/14 0848   Please HPI for pertinent positives, otherwise complete 10 system ROS negative.  Physical Exam: BP 96/57 mmHg  Pulse 79  Temp(Src) 97.7 F (36.5 C) (Oral)  Resp 18  Ht '5\' 11"'$  (1.803 m)  Wt 150 lb 13.4 oz (68.42 kg)  BMI 21.05 kg/m2  SpO2 100% Body mass index is 21.05 kg/(m^2).   General Appearance:  Alert, cooperative, no distress, appears stated age  Head:  Normocephalic, without obvious abnormality, atraumatic  ENT: Unremarkable  Neck: Supple, symmetrical, trachea midline  Lungs:   Clear to auscultation bilaterally, no w/r/r, respirations unlabored without use of accessory muscles.  Chest Wall:  No tenderness or deformity  Heart:  Regular rate and rhythm, S1, S2 normal, no murmur, rub or gallop.  Abdomen:   Soft but distended  Neurologic: Normal affect, no gross deficits.  Labs: Results for orders placed or performed during the hospital encounter of 12/09/14 (from the past 48 hour(s))  CBC with Differential     Status: Abnormal   Collection Time: 12/09/14  1:42 PM  Result Value Ref Range   WBC 19.5 (H) 4.0 - 10.5 K/uL   RBC 2.91 (L) 4.22 - 5.81 MIL/uL   Hemoglobin 7.1 (L) 13.0 - 17.0 g/dL   HCT 22.4 (L) 39.0 - 52.0 %   MCV 77.0 (L) 78.0 -  100.0 fL   MCH 24.4 (L) 26.0 - 34.0 pg   MCHC 31.7 30.0 - 36.0 g/dL   RDW 17.8 (H) 11.5 - 15.5 %   Platelets 199 150 - 400 K/uL   Neutrophils Relative % 86 (H) 43 - 77 %   Lymphocytes Relative 6 (L) 12 - 46 %   Monocytes Relative 7 3 - 12 %   Eosinophils Relative 1 0 - 5 %   Basophils Relative 0 0 - 1 %   Neutro Abs 16.7 (H) 1.7 - 7.7 K/uL   Lymphs Abs 1.2 0.7 - 4.0 K/uL   Monocytes Absolute 1.4 (H) 0.1 - 1.0 K/uL   Eosinophils Absolute 0.2 0.0 - 0.7 K/uL   Basophils Absolute 0.0 0.0 - 0.1 K/uL   RBC Morphology TARGET CELLS     Comment: POLYCHROMASIA PRESENT   WBC Morphology ATYPICAL LYMPHOCYTES   Comprehensive metabolic panel     Status: Abnormal   Collection Time: 12/09/14  1:42 PM  Result Value Ref Range   Sodium 121 (L) 135 - 145 mmol/L    Comment: Please note change in reference range. DELTA CHECK NOTED    Potassium 3.8 3.5 - 5.1 mmol/L    Comment: Please note change in reference range.   Chloride 81 (L) 96 - 112 mEq/L   CO2 30 19 - 32 mmol/L   Glucose, Bld 112 (H) 70 - 99 mg/dL   BUN 79 (H) 6 - 23 mg/dL   Creatinine, Ser 1.51 (H) 0.50 - 1.35 mg/dL   Calcium 7.8 (L) 8.4 - 10.5 mg/dL   Total Protein 5.0 (L) 6.0 - 8.3 g/dL   Albumin 2.6 (L) 3.5 - 5.2 g/dL   AST 31 0 - 37 U/L   ALT 24 0 - 53 U/L   Alkaline Phosphatase 105 39 - 117 U/L   Total Bilirubin 0.9 0.3 - 1.2 mg/dL   GFR calc non Af Amer 57 (L) >90 mL/min   GFR calc Af Amer 66 (L) >90 mL/min    Comment: (NOTE) The eGFR has been calculated using the CKD EPI equation. This calculation has not been validated in all clinical situations. eGFR's persistently <90 mL/min signify possible Chronic Kidney Disease.    Anion gap 10 5 - 15  Urinalysis, Routine w reflex microscopic     Status: Abnormal   Collection Time: 12/09/14  4:20 PM  Result Value Ref Range   Color, Urine YELLOW YELLOW   APPearance CLEAR CLEAR   Specific Gravity, Urine 1.020 1.005 - 1.030   pH 5.5 5.0 - 8.0   Glucose, UA NEGATIVE NEGATIVE mg/dL    Hgb urine dipstick TRACE (A) NEGATIVE   Bilirubin Urine NEGATIVE NEGATIVE   Ketones, ur NEGATIVE NEGATIVE mg/dL   Protein, ur NEGATIVE NEGATIVE mg/dL   Urobilinogen, UA 0.2 0.0 - 1.0 mg/dL   Nitrite NEGATIVE NEGATIVE   Leukocytes, UA NEGATIVE NEGATIVE  Urine microscopic-add on     Status: None   Collection Time: 12/09/14  4:20 PM  Result Value Ref Range   RBC / HPF 0-2 <3 RBC/hpf  Lactic acid, plasma     Status: None   Collection Time: 12/09/14  5:36 PM  Result Value Ref Range   Lactic Acid, Venous 2.0 0.5 - 2.2 mmol/L  Blood culture (routine x 2)     Status: None (Preliminary result)   Collection Time: 12/09/14  5:36 PM  Result Value Ref Range   Specimen Description RIGHT ANTECUBITAL    Special Requests BOTTLES DRAWN AEROBIC AND ANAEROBIC    Culture NO GROWTH 1 DAY    Report Status PENDING   Blood culture (routine x 2)     Status: None (Preliminary result)   Collection Time: 12/09/14  5:45 PM  Result Value Ref Range   Specimen Description BLOOD LEFT HAND    Special Requests BOTTLES DRAWN AEROBIC ONLY    Culture NO GROWTH 1 DAY    Report Status PENDING   Prepare RBC     Status: None   Collection Time: 12/09/14  8:08 PM  Result Value Ref Range   Order Confirmation ORDER PROCESSED BY BLOOD BANK   Type and screen     Status: None (Preliminary result)   Collection Time: 12/09/14  8:09 PM  Result Value Ref Range   ABO/RH(D) A POS    Antibody Screen NEG    Sample Expiration 12/12/2014    Unit Number [id number]    Blood Component Type RED CELLS,LR    Unit division 00    Status of Unit ISSUED,FINAL    Transfusion Status OK TO TRANSFUSE    Crossmatch Result Compatible    Unit Number [id number]    Blood Component Type RED CELLS,LR    Unit division 00    Status of Unit ISSUED,FINAL    Transfusion Status OK TO TRANSFUSE    Crossmatch Result Compatible    Unit Number [id number]    Blood Component Type RED CELLS,LR    Unit division 00    Status of Unit ALLOCATED     Transfusion Status OK TO TRANSFUSE    Crossmatch Result Compatible    Unit Number [id number]    Blood Component Type RED CELLS,LR    Unit division 00    Status of Unit ALLOCATED    Transfusion Status OK TO TRANSFUSE    Crossmatch Result Compatible   Prepare RBC     Status: None   Collection Time: 12/09/14  8:09 PM  Result Value Ref Range   Order Confirmation ORDER PROCESSED BY BLOOD BANK   Basic metabolic panel     Status: Abnormal   Collection Time: 12/09/14  8:25 PM  Result Value Ref Range   Sodium 126 (L) 135 - 145 mmol/L    Comment: Please note change in reference range.   Potassium 3.8 3.5 - 5.1 mmol/L    Comment: Please note change in  reference range.   Chloride 87 (L) 96 - 112 mEq/L   CO2 31 19 - 32 mmol/L   Glucose, Bld 99 70 - 99 mg/dL   BUN 71 (H) 6 - 23 mg/dL   Creatinine, Ser 1.42 (H) 0.50 - 1.35 mg/dL   Calcium 7.7 (L) 8.4 - 10.5 mg/dL   GFR calc non Af Amer 61 (L) >90 mL/min   GFR calc Af Amer 71 (L) >90 mL/min    Comment: (NOTE) The eGFR has been calculated using the CKD EPI equation. This calculation has not been validated in all clinical situations. eGFR's persistently <90 mL/min signify possible Chronic Kidney Disease.    Anion gap 8 5 - 15  Basic metabolic panel     Status: Abnormal   Collection Time: 12/10/14  7:01 AM  Result Value Ref Range   Sodium 125 (L) 135 - 145 mmol/L    Comment: Please note change in reference range.   Potassium 3.7 3.5 - 5.1 mmol/L    Comment: Please note change in reference range.   Chloride 91 (L) 96 - 112 mEq/L   CO2 28 19 - 32 mmol/L   Glucose, Bld 109 (H) 70 - 99 mg/dL   BUN 64 (H) 6 - 23 mg/dL   Creatinine, Ser 1.26 0.50 - 1.35 mg/dL   Calcium 7.1 (L) 8.4 - 10.5 mg/dL   GFR calc non Af Amer 71 (L) >90 mL/min   GFR calc Af Amer 82 (L) >90 mL/min    Comment: (NOTE) The eGFR has been calculated using the CKD EPI equation. This calculation has not been validated in all clinical situations. eGFR's persistently  <90 mL/min signify possible Chronic Kidney Disease.    Anion gap 6 5 - 15  CBC     Status: Abnormal   Collection Time: 12/10/14  7:01 AM  Result Value Ref Range   WBC 9.5 4.0 - 10.5 K/uL   RBC 2.86 (L) 4.22 - 5.81 MIL/uL   Hemoglobin 7.3 (L) 13.0 - 17.0 g/dL   HCT 22.5 (L) 39.0 - 52.0 %   MCV 78.7 78.0 - 100.0 fL   MCH 25.5 (L) 26.0 - 34.0 pg   MCHC 32.4 30.0 - 36.0 g/dL   RDW 16.8 (H) 11.5 - 15.5 %   Platelets 107 (L) 150 - 400 K/uL    Comment: SPECIMEN CHECKED FOR CLOTS PLATELET COUNT CONFIRMED BY SMEAR   Basic metabolic panel     Status: Abnormal   Collection Time: 12/10/14 11:30 AM  Result Value Ref Range   Sodium 126 (L) 135 - 145 mmol/L    Comment: Please note change in reference range.   Potassium 3.8 3.5 - 5.1 mmol/L    Comment: Please note change in reference range.   Chloride 92 (L) 96 - 112 mEq/L   CO2 28 19 - 32 mmol/L   Glucose, Bld 98 70 - 99 mg/dL   BUN 60 (H) 6 - 23 mg/dL   Creatinine, Ser 1.23 0.50 - 1.35 mg/dL   Calcium 7.2 (L) 8.4 - 10.5 mg/dL   GFR calc non Af Amer 73 (L) >90 mL/min   GFR calc Af Amer 85 (L) >90 mL/min    Comment: (NOTE) The eGFR has been calculated using the CKD EPI equation. This calculation has not been validated in all clinical situations. eGFR's persistently <90 mL/min signify possible Chronic Kidney Disease.    Anion gap 6 5 - 15  CBC with Differential     Status: Abnormal  Collection Time: 12/10/14 11:30 AM  Result Value Ref Range   WBC 8.7 4.0 - 10.5 K/uL   RBC 2.95 (L) 4.22 - 5.81 MIL/uL   Hemoglobin 7.5 (L) 13.0 - 17.0 g/dL   HCT 23.1 (L) 39.0 - 52.0 %   MCV 78.3 78.0 - 100.0 fL   MCH 25.4 (L) 26.0 - 34.0 pg   MCHC 32.5 30.0 - 36.0 g/dL   RDW 16.8 (H) 11.5 - 15.5 %   Platelets 105 (L) 150 - 400 K/uL    Comment: SPECIMEN CHECKED FOR CLOTS PLATELET COUNT CONFIRMED BY SMEAR    Neutrophils Relative % 88 (H) 43 - 77 %   Neutro Abs 7.6 1.7 - 7.7 K/uL   Lymphocytes Relative 4 (L) 12 - 46 %   Lymphs Abs 0.3 (L) 0.7 -  4.0 K/uL   Monocytes Relative 8 3 - 12 %   Monocytes Absolute 0.7 0.1 - 1.0 K/uL   Eosinophils Relative 0 0 - 5 %   Eosinophils Absolute 0.0 0.0 - 0.7 K/uL   Basophils Relative 0 0 - 1 %   Basophils Absolute 0.0 0.0 - 0.1 K/uL  Protime-INR     Status: Abnormal   Collection Time: 12/10/14 11:30 AM  Result Value Ref Range   Prothrombin Time 18.5 (H) 11.6 - 15.2 seconds   INR 1.53 (H) 0.00 - 2.29  Basic metabolic panel     Status: Abnormal   Collection Time: 12/10/14  5:13 PM  Result Value Ref Range   Sodium 128 (L) 135 - 145 mmol/L    Comment: Please note change in reference range.   Potassium 4.5 3.5 - 5.1 mmol/L    Comment: Please note change in reference range.   Chloride 97 96 - 112 mEq/L   CO2 25 19 - 32 mmol/L   Glucose, Bld 117 (H) 70 - 99 mg/dL   BUN 55 (H) 6 - 23 mg/dL   Creatinine, Ser 1.11 0.50 - 1.35 mg/dL   Calcium 6.7 (L) 8.4 - 10.5 mg/dL   GFR calc non Af Amer 83 (L) >90 mL/min   GFR calc Af Amer >90 >90 mL/min    Comment: (NOTE) The eGFR has been calculated using the CKD EPI equation. This calculation has not been validated in all clinical situations. eGFR's persistently <90 mL/min signify possible Chronic Kidney Disease.    Anion gap 6 5 - 15  Basic metabolic panel     Status: Abnormal   Collection Time: 12/10/14 10:22 PM  Result Value Ref Range   Sodium 127 (L) 135 - 145 mmol/L    Comment: Please note change in reference range.   Potassium 4.5 3.5 - 5.1 mmol/L    Comment: Please note change in reference range.   Chloride 94 (L) 96 - 112 mEq/L   CO2 26 19 - 32 mmol/L   Glucose, Bld 142 (H) 70 - 99 mg/dL   BUN 52 (H) 6 - 23 mg/dL   Creatinine, Ser 1.16 0.50 - 1.35 mg/dL   Calcium 7.2 (L) 8.4 - 10.5 mg/dL   GFR calc non Af Amer 78 (L) >90 mL/min   GFR calc Af Amer >90 >90 mL/min    Comment: (NOTE) The eGFR has been calculated using the CKD EPI equation. This calculation has not been validated in all clinical situations. eGFR's persistently <90 mL/min  signify possible Chronic Kidney Disease.    Anion gap 7 5 - 15  CBC with Differential     Status: Abnormal  Collection Time: 12/11/14  4:50 AM  Result Value Ref Range   WBC 4.6 4.0 - 10.5 K/uL   RBC 2.50 (L) 4.22 - 5.81 MIL/uL   Hemoglobin 6.4 (LL) 13.0 - 17.0 g/dL    Comment: RESULT REPEATED AND VERIFIED CRITICAL RESULT CALLED TO, READ BACK BY AND VERIFIED WITH: KRISTI THOMAS RN ON [id number] AT 0515 BY RESSEGGER R    HCT 19.6 (L) 39.0 - 52.0 %   MCV 78.4 78.0 - 100.0 fL   MCH 25.6 (L) 26.0 - 34.0 pg   MCHC 32.7 30.0 - 36.0 g/dL   RDW 16.6 (H) 11.5 - 15.5 %   Platelets 80 (L) 150 - 400 K/uL    Comment: RESULT REPEATED AND VERIFIED SPECIMEN CHECKED FOR CLOTS PLATELET COUNT CONFIRMED BY SMEAR    Neutrophils Relative % 73 43 - 77 %   Neutro Abs 3.3 1.7 - 7.7 K/uL   Lymphocytes Relative 14 12 - 46 %   Lymphs Abs 0.6 (L) 0.7 - 4.0 K/uL   Monocytes Relative 13 (H) 3 - 12 %   Monocytes Absolute 0.6 0.1 - 1.0 K/uL   Eosinophils Relative 1 0 - 5 %   Eosinophils Absolute 0.0 0.0 - 0.7 K/uL   Basophils Relative 0 0 - 1 %   Basophils Absolute 0.0 0.0 - 0.1 K/uL  Protime-INR     Status: Abnormal   Collection Time: 12/11/14  4:50 AM  Result Value Ref Range   Prothrombin Time 16.7 (H) 11.6 - 15.2 seconds   INR 1.33 0.00 - 1.49  Comprehensive metabolic panel     Status: Abnormal   Collection Time: 12/11/14  4:50 AM  Result Value Ref Range   Sodium 126 (L) 135 - 145 mmol/L    Comment: Please note change in reference range.   Potassium 3.8 3.5 - 5.1 mmol/L    Comment: Please note change in reference range.   Chloride 94 (L) 96 - 112 mEq/L   CO2 26 19 - 32 mmol/L   Glucose, Bld 139 (H) 70 - 99 mg/dL   BUN 44 (H) 6 - 23 mg/dL   Creatinine, Ser 0.97 0.50 - 1.35 mg/dL   Calcium 7.3 (L) 8.4 - 10.5 mg/dL   Total Protein 4.0 (L) 6.0 - 8.3 g/dL   Albumin 2.0 (L) 3.5 - 5.2 g/dL   AST 25 0 - 37 U/L   ALT 20 0 - 53 U/L   Alkaline Phosphatase 70 39 - 117 U/L   Total Bilirubin 0.7 0.3 - 1.2  mg/dL   GFR calc non Af Amer >90 >90 mL/min   GFR calc Af Amer >90 >90 mL/min    Comment: (NOTE) The eGFR has been calculated using the CKD EPI equation. This calculation has not been validated in all clinical situations. eGFR's persistently <90 mL/min signify possible Chronic Kidney Disease.    Anion gap 6 5 - 15  Magnesium     Status: None   Collection Time: 12/11/14  4:50 AM  Result Value Ref Range   Magnesium 2.1 1.5 - 2.5 mg/dL  Phosphorus     Status: None   Collection Time: 12/11/14  4:50 AM  Result Value Ref Range   Phosphorus 3.0 2.3 - 4.6 mg/dL  Triglycerides     Status: None   Collection Time: 12/11/14  4:50 AM  Result Value Ref Range   Triglycerides 87 <150 mg/dL    Imaging: Ct Abdomen Pelvis Wo Contrast  12/09/2014   CLINICAL DATA:  Weakness for  4 days with low-grade fever, history of Crohn's disease, recent gastrostomy placement  EXAM: CT ABDOMEN AND PELVIS WITHOUT CONTRAST  TECHNIQUE: Multidetector CT imaging of the abdomen and pelvis was performed following the standard protocol without IV contrast.  COMPARISON:  03/07/2014, 10/15/2014  FINDINGS: Lung bases again demonstrate diffuse reticulonodular airspace disease particularly in the right lower lobe but to a lesser degree in the left lower lobe stable from the recent MRI examination.  Gallbladder has been surgically removed. The liver is stable in appearance. The spleen is enlarged in size but stable from the prior MRI examination. Stomach is significantly distended and a gastrostomy catheter is noted in place. The adrenal glands and pancreas are within normal limits. The kidneys are well visualized bilaterally and reveal some cystic change on the right. Fullness of the collecting systems is noted bilaterally although no definitive obstructive changes are seen. No ureteral calculi are seen. Mild ascites is noted.  There is some generalized distention of the small bowel without definitive obstructive change. Changes are  similar to that seen on the prior exam. Changes may be related to the patient's given clinical history of Crohn's disease. This appearance is stable from the prior exam bony structures are stable in appearance. The appendix is not visualized consistent with prior cholecystectomy.  IMPRESSION: Splenomegaly likely related to a degree of portal hypertension. This is stable from previous exams.  Gastrostomy catheter in place within a distended stomach.  Mild fullness of the collecting systems bilaterally although no true obstructive changes are seen.  Generalized prominence of the small bowel which is stable when compared with prior exams. This may be related to the patient's underlying Crohn's disease.  Mild ascites stable from the prior exam.   Electronically Signed   By: Inez Catalina M.D.   On: 12/09/2014 17:47   Dg Chest Portable 1 View  12/09/2014   CLINICAL DATA:  Decreased oral intake, generalized weakness  EXAM: PORTABLE CHEST - 1 VIEW  COMPARISON:  08/21/2014  FINDINGS: Cardiac shadow is stable. The left lung is clear. The right lung is well aerated but demonstrates some patchy infiltrative changes in the right lung base which may be related aspiration pneumonia. Clinical correlation is recommended.  IMPRESSION: New right basilar infiltrative change. This may represent aspiration pneumonia. Clinical correlation is recommended.   Electronically Signed   By: Inez Catalina M.D.   On: 12/09/2014 14:58    Assessment/Plan Severe Crohn's with gastric distension and non-functional decompressive gastric tube. Discussed with pt plan to exchange. At 3 weeks, this is not ideal time to exchange but T-Tac was used at initial procedure. Will also try to upsize to at least 20Fr with a more traditional gastric tube that should function better. Explained procedure, risks. Pt already with low BP and Hgb of 6.4. He is for PRBC transfusion today as well. Hopefully will not need to sedate much to complete  procedure. Labs reviewed Consent signed in chart  Ascencion Dike PA-C 12/11/2014, 11:17 AM

## 2014-12-11 NOTE — Progress Notes (Signed)
PARENTERAL NUTRITION CONSULT NOTE  Pharmacy Consult for TPN Indication: intolerance to oral feeding  Allergies  Allergen Reactions  . Remicade [Infliximab]     Dr Karilyn Cotaehman states previous respiratory arrest not related to remicade. Dr Karilyn Cotaehman states remicade not a drug related allergy. 07-07-2013 at 1025 rapid response called to PACU- Patient difficulty breathing after infusion of Remicade infusing. Do NOT Give Remicade!  . Fentanyl And Related Itching    Swelling, Redness  . Alfentanil Itching    Swelling, Redness  . Wellbutrin [Bupropion] Nausea And Vomiting  . Morphine And Related Hives   Patient Measurements: Height: 5\' 11"  (180.3 cm) Weight: 150 lb 13.4 oz (68.42 kg) IBW/kg (Calculated) : 75.3 Adjusted Body Weight: n/a  Vital Signs: Temp: 97.7 F (36.5 C) (12/29 0949) Temp Source: Oral (12/29 0553) BP: 96/57 mmHg (12/29 0949) Pulse Rate: 79 (12/29 0949) Intake/Output from previous day: 12/28 0701 - 12/29 0700 In: 3025.7 [I.V.:2327.5; TPN:698.2] Out: 1350 [Urine:1350] Intake/Output from this shift: Total I/O In: -  Out: 350 [Urine:350]  Labs:  Recent Labs  12/10/14 0701 12/10/14 1130 12/11/14 0450  WBC 9.5 8.7 4.6  HGB 7.3* 7.5* 6.4*  HCT 22.5* 23.1* 19.6*  PLT 107* 105* 80*  INR  --  1.53* 1.33     Recent Labs  12/09/14 1342  12/10/14 1713 12/10/14 2222 12/11/14 0450  NA 121*  < > 128* 127* 126*  K 3.8  < > 4.5 4.5 3.8  CL 81*  < > 97 94* 94*  CO2 30  < > 25 26 26   GLUCOSE 112*  < > 117* 142* 139*  BUN 79*  < > 55* 52* 44*  CREATININE 1.51*  < > 1.11 1.16 0.97  CALCIUM 7.8*  < > 6.7* 7.2* 7.3*  MG  --   --   --   --  2.1  PHOS  --   --   --   --  3.0  PROT 5.0*  --   --   --  4.0*  ALBUMIN 2.6*  --   --   --  2.0*  AST 31  --   --   --  25  ALT 24  --   --   --  20  ALKPHOS 105  --   --   --  70  BILITOT 0.9  --   --   --  0.7  TRIG  --   --   --   --  87  < > = values in this interval not displayed. Estimated Creatinine Clearance: 99.9  mL/min (by C-G formula based on Cr of 0.97).   No results for input(s): GLUCAP in the last 72 hours.  Medical History: Past Medical History  Diagnosis Date  . 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 Hepatomegaly 05/04/2011  . Fatty liver 05/04/2011    "stage III fatty liver fibrosis" (11/15/2012)  . GERD (gastroesophageal reflux disease)   . Chronic liver disease     /notes 11/15/2012  . Pericarditis     [name]/notes 11/15/2012  . Hypertension   . Pneumonia 1977  . Shortness of breath     "all the time right now" (11/15/2012)  . History of blood transfusion 2004  . History of stomach ulcers   . Duodenal ulcer   . Depression   . Kidney stones     bilaterally/notes 11/15/2012  . Hepatic fibrosis     /  notes 11/15/2012  . Anemia   . Pericardial effusion 10/29/2012    moderate to large/notes 11/15/2012  . Anxiety   . Hepatitis   . Crohn's disease   . ED (erectile dysfunction)   . 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 hx crohns diag ~2002, Cirrhosis, Anemia, Failure to thrive: noted weight loss.  Tolerating TPN.  Electrolytes WNL except for low Na and CL. Calcium corrects to 8.9mg /dL after accounting for low albumin.  [facility] will be performing G tube exchange/placement which is to be utilized for decompression.  Nutritional Goals:  1800-1900 kCal, 105-115 grams of protein per day  Plan:   Increase Clinimix E 5/15 at 91ml/hr  Decrease IV fluid by 20ml/hr  Provide lipids, trace elements, and MVI with TPN   Monitor lytes, triglycerides, glucose tolerance, renal fxn, and fluid status  Add CBG every 6 hours  Transition to tube feedings when improved / feasible.  Yishai, Favre R 12/11/2014,11:09 AM

## 2014-12-12 DIAGNOSIS — R52 Pain, unspecified: Secondary | ICD-10-CM | POA: Insufficient documentation

## 2014-12-12 DIAGNOSIS — E43 Unspecified severe protein-calorie malnutrition: Secondary | ICD-10-CM

## 2014-12-12 LAB — RETICULOCYTES
RBC.: 3.29 MIL/uL — ABNORMAL LOW (ref 4.22–5.81)
Retic Count, Absolute: 82.3 10*3/uL (ref 19.0–186.0)
Retic Ct Pct: 2.5 % (ref 0.4–3.1)

## 2014-12-12 LAB — TYPE AND SCREEN
ABO/RH(D): A POS
ANTIBODY SCREEN: NEGATIVE
UNIT DIVISION: 0
Unit division: 0
Unit division: 0
Unit division: 0
Unit division: 0
Unit division: 0

## 2014-12-12 LAB — BASIC METABOLIC PANEL
ANION GAP: 6 (ref 5–15)
Anion gap: 5 (ref 5–15)
BUN: 25 mg/dL — ABNORMAL HIGH (ref 6–23)
BUN: 24 mg/dL — ABNORMAL HIGH (ref 6–23)
CHLORIDE: 98 meq/L (ref 96–112)
CO2: 26 mmol/L (ref 19–32)
CO2: 26 mmol/L (ref 19–32)
CREATININE: 0.78 mg/dL (ref 0.50–1.35)
CREATININE: 0.72 mg/dL (ref 0.50–1.35)
Calcium: 7.6 mg/dL — ABNORMAL LOW (ref 8.4–10.5)
Calcium: 7.5 mg/dL — ABNORMAL LOW (ref 8.4–10.5)
Chloride: 97 mEq/L (ref 96–112)
GFR calc non Af Amer: >90 mL/min (ref >90)
GFR calc non Af Amer: >90 mL/min (ref >90)
GLUCOSE: 132 mg/dL — AB (ref 70–99)
Glucose, Bld: 155 mg/dL — ABNORMAL HIGH (ref 70–99)
Potassium: 4.3 mmol/L (ref 3.5–5.1)
Potassium: 4.8 mmol/L (ref 3.5–5.1)
SODIUM: 130 mmol/L — AB (ref 135–145)
Sodium: 128 mmol/L — ABNORMAL LOW (ref 135–145)

## 2014-12-12 LAB — CBC WITH DIFFERENTIAL/PLATELET
BASOS ABS: 0 10*3/uL (ref 0.0–0.1)
BASOS PCT: 0 % (ref 0–1)
Eosinophils Absolute: 0 10*3/uL (ref 0.0–0.7)
Eosinophils Relative: 1 % (ref 0–5)
HCT: 23.8 % — ABNORMAL LOW (ref 39.0–52.0)
Hemoglobin: 7.7 g/dL — ABNORMAL LOW (ref 13.0–17.0)
LYMPHS PCT: 14 % (ref 12–46)
Lymphs Abs: 0.7 10*3/uL (ref 0.7–4.0)
MCH: 26 pg (ref 26.0–34.0)
MCHC: 32.4 g/dL (ref 30.0–36.0)
MCV: 80.4 fL (ref 78.0–100.0)
MONO ABS: 0.6 10*3/uL (ref 0.1–1.0)
Monocytes Relative: 12 % (ref 3–12)
NEUTROS ABS: 3.6 10*3/uL (ref 1.7–7.7)
Neutrophils Relative %: 74 % (ref 43–77)
PLATELETS: 70 10*3/uL — AB (ref 150–400)
RBC: 2.96 MIL/uL — ABNORMAL LOW (ref 4.22–5.81)
RDW: 16.1 % — AB (ref 11.5–15.5)
WBC: 4.8 10*3/uL (ref 4.0–10.5)

## 2014-12-12 LAB — COMPREHENSIVE METABOLIC PANEL
ALT: 24 U/L (ref 0–53)
AST: 28 U/L (ref 0–37)
Albumin: 2.2 g/dL — ABNORMAL LOW (ref 3.5–5.2)
Alkaline Phosphatase: 80 U/L (ref 39–117)
Anion gap: 4 — ABNORMAL LOW (ref 5–15)
BUN: 29 mg/dL — ABNORMAL HIGH (ref 6–23)
CO2: 26 mmol/L (ref 19–32)
Calcium: 7.1 mg/dL — ABNORMAL LOW (ref 8.4–10.5)
Chloride: 93 mEq/L — ABNORMAL LOW (ref 96–112)
Creatinine, Ser: 0.76 mg/dL (ref 0.50–1.35)
GFR calc non Af Amer: >90 mL/min (ref >90)
GLUCOSE: 129 mg/dL — AB (ref 70–99)
Potassium: 3.8 mmol/L (ref 3.5–5.1)
SODIUM: 123 mmol/L — AB (ref 135–145)
TOTAL PROTEIN: 4.2 g/dL — AB (ref 6.0–8.3)
Total Bilirubin: 1.3 mg/dL — ABNORMAL HIGH (ref 0.3–1.2)

## 2014-12-12 LAB — CREATININE, URINE, RANDOM: Creatinine, Urine: 79.41 mg/dL

## 2014-12-12 LAB — GLUCOSE, CAPILLARY
GLUCOSE-CAPILLARY: 105 mg/dL — AB (ref 70–99)
GLUCOSE-CAPILLARY: 118 mg/dL — AB (ref 70–99)
GLUCOSE-CAPILLARY: 153 mg/dL — AB (ref 70–99)
Glucose-Capillary: 145 mg/dL — ABNORMAL HIGH (ref 70–99)
Glucose-Capillary: 163 mg/dL — ABNORMAL HIGH (ref 70–99)

## 2014-12-12 LAB — MAGNESIUM: Magnesium: 2 mg/dL (ref 1.5–2.5)

## 2014-12-12 LAB — TSH: TSH: 3.635 u[IU]/mL (ref 0.350–4.500)

## 2014-12-12 LAB — SODIUM, URINE, RANDOM: Sodium, Ur: <10 mmol/L

## 2014-12-12 LAB — PHOSPHORUS: PHOSPHORUS: 2.3 mg/dL (ref 2.3–4.6)

## 2014-12-12 LAB — OCCULT BLOOD X 1 CARD TO LAB, STOOL: Fecal Occult Bld: POSITIVE — AB

## 2014-12-12 MED ORDER — FUROSEMIDE 40 MG PO TABS
40.0000 mg | ORAL_TABLET | Freq: Every day | ORAL | Status: DC
  Administered 2014-12-12 – 2014-12-14 (×3): 40 mg via ORAL
  Filled 2014-12-12 (×3): qty 1

## 2014-12-12 MED ORDER — M.V.I. ADULT IV INJ
INJECTION | INTRAVENOUS | Status: AC
  Administered 2014-12-12: 17:00:00 via INTRAVENOUS
  Filled 2014-12-12: qty 2000

## 2014-12-12 MED ORDER — LEVOFLOXACIN 500 MG PO TABS: 500.0000 mg | ORAL_TABLET | ORAL | Status: DC

## 2014-12-12 MED ORDER — LEVOFLOXACIN 500 MG PO TABS
500.0000 mg | ORAL_TABLET | Freq: Every day | ORAL | Status: DC
  Administered 2014-12-12 – 2014-12-14 (×3): 500 mg via ORAL
  Filled 2014-12-12 (×3): qty 1

## 2014-12-12 MED ORDER — INSULIN ASPART 100 UNIT/ML ~~LOC~~ SOLN
0.0000 [IU] | Freq: Four times a day (QID) | SUBCUTANEOUS | Status: DC
Start: 2014-12-12 — End: 2014-12-14
  Administered 2014-12-12: 1 [IU] via SUBCUTANEOUS
  Administered 2014-12-12: 2 [IU] via SUBCUTANEOUS
  Administered 2014-12-13: 1 [IU] via SUBCUTANEOUS
  Administered 2014-12-13: 2 [IU] via SUBCUTANEOUS
  Administered 2014-12-13: 1 [IU] via SUBCUTANEOUS
  Administered 2014-12-13: 3 [IU] via SUBCUTANEOUS
  Administered 2014-12-14 (×2): 1 [IU] via SUBCUTANEOUS
  Administered 2014-12-14: 2 [IU] via SUBCUTANEOUS

## 2014-12-12 MED ORDER — SODIUM CHLORIDE 0.9 % IV SOLN: INTRAVENOUS | Status: DC

## 2014-12-12 MED ORDER — FAT EMULSION 20 % IV EMUL
250.0000 mL | INTRAVENOUS | Status: AC
  Administered 2014-12-12: 250 mL via INTRAVENOUS
  Filled 2014-12-12: qty 250

## 2014-12-12 MED ORDER — SODIUM CHLORIDE 0.9 % IV SOLN
INTRAVENOUS | Status: DC
Start: 2014-12-12 — End: 2014-12-12

## 2014-12-12 NOTE — Progress Notes (Signed)
PARENTERAL NUTRITION CONSULT NOTE  Pharmacy Consult for TPN Indication: intolerance to oral feeding  Allergies  Allergen Reactions  . Remicade [Infliximab]     Dr Karilyn Cotaehman states previous respiratory arrest not related to remicade. Dr Karilyn Cotaehman states remicade not a drug related allergy. 07-07-2013 at 1025 rapid response called to PACU- Patient difficulty breathing after infusion of Remicade infusing. Do NOT Give Remicade!  . Fentanyl And Related Itching    Swelling, Redness  . Alfentanil Itching    Swelling, Redness  . Wellbutrin [Bupropion] Nausea And Vomiting  . Morphine And Related Hives   Patient Measurements: Height: 5\' 11"  (180.3 cm) Weight: 150 lb 13.4 oz (68.42 kg) IBW/kg (Calculated) : 75.3  Vital Signs: Temp: 97.9 F (36.6 C) (12/30 0627) Temp Source: Oral (12/30 0627) BP: 96/55 mmHg (12/30 0627) Pulse Rate: 78 (12/30 0627) Intake/Output from previous day: 12/29 0701 - 12/30 0700 In: 3996 [P.O.:684; I.V.:709.3; Blood:742; NG/GT:268.7; TPN:1592] Out: 850 [Urine:650; Drains:200] Intake/Output from this shift: Total I/O In: 120 [P.O.:120] Out: 225 [Urine:225]  Labs:  Recent Labs  12/10/14 1130 12/11/14 0450 12/12/14 0315  WBC 8.7 4.6 4.8  HGB 7.5* 6.4* 7.7*  HCT 23.1* 19.6* 23.8*  PLT 105* 80* 70*  INR 1.53* 1.33  --      Recent Labs  12/09/14 1342  12/10/14 2222 12/11/14 0450 12/12/14 0315  NA 121*  < > 127* 126* 123*  K 3.8  < > 4.5 3.8 3.8  CL 81*  < > 94* 94* 93*  CO2 30  < > 26 26 26   GLUCOSE 112*  < > 142* 139* 129*  BUN 79*  < > 52* 44* 29*  CREATININE 1.51*  < > 1.16 0.97 0.76  CALCIUM 7.8*  < > 7.2* 7.3* 7.1*  MG  --   --   --  2.1 2.0  PHOS  --   --   --  3.0 2.3  PROT 5.0*  --   --  4.0* 4.2*  ALBUMIN 2.6*  --   --  2.0* 2.2*  AST 31  --   --  25 28  ALT 24  --   --  20 24  ALKPHOS 105  --   --  70 80  BILITOT 0.9  --   --  0.7 1.3*  PREALBUMIN  --   --   --  9.9*  --   TRIG  --   --   --  87  --   < > = values in this interval  not displayed. Estimated Creatinine Clearance: 121.1 mL/min (by C-G formula based on Cr of 0.76).    Recent Labs  12/11/14 1821 12/12/14 0002 12/12/14 0626  GLUCAP 153* 105* 118*    Medical History: Past Medical History  Diagnosis Date  . 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 Hepatomegaly 05/04/2011  . Fatty liver 05/04/2011    "stage III fatty liver fibrosis" (11/15/2012)  . GERD (gastroesophageal reflux disease)   . Chronic liver disease     /notes 11/15/2012  . Pericarditis     [name]/notes 11/15/2012  . Hypertension   . Pneumonia 1977  . Shortness of breath     "all the time right now" (11/15/2012)  . History of blood transfusion 2004  . History of stomach ulcers   . Duodenal ulcer   . Depression   . Kidney stones     bilaterally/notes 11/15/2012  .  Hepatic fibrosis     [name] 11/15/2012  . Anemia   . Pericardial effusion 10/29/2012    moderate to large/notes 11/15/2012  . Anxiety   . Hepatitis   . Crohn's disease   . ED (erectile dysfunction)   . 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 hx crohns diag ~2002, Cirrhosis, Anemia, Failure to thrive: noted weight loss.  Tolerating TPN.  Electrolytes WNL except for low Na and CL.  Sodium Chloride tabs started per MD.  Low albumin & prealbumin. Calcium corrects to WNL after accounting for low albumin.    Abdominal distention this AM, TF's stopped.  [facility] performed tube exchange/placement on 12/29 which is to be utilized for decompression. G-tube to gravity drainage; do not use for enteral feeding / patient not a candidate for oral/enteral feedings at this time  Estimated Nutritional Needs: Kcal: 2100-2300 Protein: 96-106 grams Fluid: 2.1-2.3 L  Plan:   Change Clinimix E 5/20 at  23ml/hr  Decrease IV fluid by 29ml/hr  Provide lipids, trace elements, and MVI with TPN   Monitor lytes, triglycerides, glucose tolerance, renal fxn, and fluid status  Continue CBG every 6 hours, add sensitive scale SSI.  Strict I/O documentation ordered.  Lamonte Richer R 12/12/2014,11:21 AM

## 2014-12-12 NOTE — Progress Notes (Deleted)
PARENTERAL NUTRITION CONSULT NOTE  Pharmacy Consult for TPN Indication: intolerance to oral feeding  Allergies  Allergen Reactions  . Remicade [Infliximab]     Dr Karilyn Cotaehman states previous respiratory arrest not related to remicade. Dr Karilyn Cotaehman states remicade not a drug related allergy. 07-07-2013 at 1025 rapid response called to PACU- Patient difficulty breathing after infusion of Remicade infusing. Do NOT Give Remicade!  . Fentanyl And Related Itching    Swelling, Redness  . Alfentanil Itching    Swelling, Redness  . Wellbutrin [Bupropion] Nausea And Vomiting  . Morphine And Related Hives   Patient Measurements: Height: 5\' 11"  (180.3 cm) Weight: 150 lb 13.4 oz (68.42 kg) IBW/kg (Calculated) : 75.3  Vital Signs: Temp: 97.9 F (36.6 C) (12/30 0627) Temp Source: Oral (12/30 0627) BP: 96/55 mmHg (12/30 0627) Pulse Rate: 78 (12/30 0627) Intake/Output from previous day: 12/29 0701 - 12/30 0700 In: 3996 [P.O.:684; I.V.:709.3; Blood:742; NG/GT:268.7; TPN:1592] Out: 850 [Urine:650; Drains:200] Intake/Output from this shift: Total I/O In: 120 [P.O.:120] Out: 225 [Urine:225]  Labs:  Recent Labs  12/10/14 1130 12/11/14 0450 12/12/14 0315  WBC 8.7 4.6 4.8  HGB 7.5* 6.4* 7.7*  HCT 23.1* 19.6* 23.8*  PLT 105* 80* 70*  INR 1.53* 1.33  --      Recent Labs  12/09/14 1342  12/11/14 0450 12/12/14 0315 12/12/14 1110  NA 121*  < > 126* 123* 130*  K 3.8  < > 3.8 3.8 4.3  CL 81*  < > 94* 93* 98  CO2 30  < > 26 26 26   GLUCOSE 112*  < > 139* 129* 155*  BUN 79*  < > 44* 29* 25*  CREATININE 1.51*  < > 0.97 0.76 0.78  CALCIUM 7.8*  < > 7.3* 7.1* 7.6*  MG  --   --  2.1 2.0  --   PHOS  --   --  3.0 2.3  --   PROT 5.0*  --  4.0* 4.2*  --   ALBUMIN 2.6*  --  2.0* 2.2*  --   AST 31  --  25 28  --   ALT 24  --  20 24  --   ALKPHOS 105  --  70 80  --   BILITOT 0.9  --  0.7 1.3*  --   PREALBUMIN  --   --  9.9*  --   --   TRIG  --   --  87  --   --   < > = values in this interval  not displayed. Estimated Creatinine Clearance: 121.1 mL/min (by C-G formula based on Cr of 0.78).    Recent Labs  12/12/14 0002 12/12/14 0626 12/12/14 1131  GLUCAP 105* 118* 145*    Medical History: Past Medical History  Diagnosis Date  . 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 Hepatomegaly 05/04/2011  . Fatty liver 05/04/2011    "stage III fatty liver fibrosis" (11/15/2012)  . GERD (gastroesophageal reflux disease)   . Chronic liver disease     /notes 11/15/2012  . Pericarditis     [name]/notes 11/15/2012  . Hypertension   . Pneumonia 1977  . Shortness of breath     "all the time right now" (11/15/2012)  . History of blood transfusion 2004  . History of stomach ulcers   . Duodenal ulcer   . Depression   . Kidney stones     bilaterally/notes 11/15/2012  .  Hepatic fibrosis     [name] 11/15/2012  . Anemia   . Pericardial effusion 10/29/2012    moderate to large/notes 11/15/2012  . Anxiety   . Hepatitis   . Crohn's disease   . ED (erectile dysfunction)   . 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 hx crohns diag ~2002, Cirrhosis, Anemia, Failure to thrive: noted weight loss.  Tolerating TPN.  Electrolytes WNL except for low Na and CL.  Sodium Chloride tabs started per MD.  Low albumin & prealbumin. Calcium corrects to WNL after accounting for low albumin.    Abdominal distention this AM, TF's stopped.  [facility] performed tube exchange/placement on 12/29 which is to be utilized for decompression. G-tube to gravity drainage; do not use for enteral feeding / patient not a candidate for oral/enteral feedings at this time  Estimated Nutritional Needs: Kcal: 2100-2300 Protein: 96-106 grams Fluid: 2.1-2.3 L  Plan:   Change Clinimix E 5/20 at  37ml/hr  Decrease IV fluid by 53ml/hr  Provide lipids, trace elements, and MVI with TPN   Monitor lytes, triglycerides, glucose tolerance, renal fxn, and fluid status  Continue CBG every 6 hours, add sensitive scale SSI.  Strict I/O documentation ordered.  Lamonte Richer R 12/12/2014,12:03 PM

## 2014-12-12 NOTE — Progress Notes (Addendum)
Nutrition Follow-up  INTERVENTION: -TPN per pharmacy  NUTRITION DIAGNOSIS: Inadequate oral intake related to altered GI function as evidenced by NPO.   Goal: Pt will meet >90% of estimated nutritional needs  Monitor:  TPN tolerance, transition to PO diet/enteral feedings, labs, weight changes, I/O's  Reason for Assessment: MST=5, New TPN   David [name] is a 38 y.o. male with a history of Crohn disease with fistula, Stage III fatty liver fibrosis and chronic liver disease, anemia, hypertension, failure to thrive, on chronic steroids. 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 Patient has a gastrostomy tube placed percutaneously. Over the past several weeks, the patient's abdominal pain has been increasing. Patient has also been increasingly intolerant of oral foods and liquids. Due to weakness, lightheadedness and dizziness while in relating, the patient presented the hospital for evaluation.  ASSESSMENT:  Pt not candidate for enteral feeding at this time. Pt is expecting to be discharged home with TPN.  Patient is receiving TPN with Clinimix E 5/20 @ 70 ml/hr and lipids  @ 10 ml/hr. Provides 1920ml, 1958 kcal, and 84 grams protein per day. Meets 93% minimum estimated energy needs and 88% minimum estimated protein needs.   Height: Ht Readings from Last 1 Encounters:  12/09/14 5\' 11"  (1.803 m)    Weight: Wt Readings from Last 1 Encounters:  12/09/14 150 lb 13.4 oz (68.42 kg)    Ideal Body Weight: 172#  Wt Readings from Last 10 Encounters:  12/09/14 150 lb 13.4 oz (68.42 kg)  11/27/14 163 lb 4.8 oz (74.072 kg)  10/15/14 195 lb (88.451 kg)  09/26/14 211 lb 8 oz (95.936 kg)  08/21/14 193 lb 6.4 oz (87.726 kg)  06/18/14 193 lb 6.4 oz (87.726 kg)  05/14/14 214 lb 3.2 oz (97.16 kg)  03/09/14 229 lb  (103.874 kg)  03/06/14 230 lb 3.2 oz (104.418 kg)  11/06/13 220 lb 6.4 oz (99.973 kg)    Usual Body Weight: 220#  BMI:  Body mass index is 21.05 kg/(m^2). Normal weight range  Estimated Nutritional Needs: Kcal: 2100-2300 Protein: 96-106 grams Fluid: 2.1-2.3 L  Skin: surgical incision to mid abdomen, PEG  Diet Order: TPN (CLINIMIX-E) Adult Diet NPO time specified Except for: Ice Chips, Sips with Meds  EDUCATION NEEDS: -Education not appropriate at this time   Intake/Output Summary (Last 24 hours) at 12/12/14 1026 Last data filed at 12/12/14 0645  Gross per 24 hour  Intake   3996 ml  Output    500 ml  Net   3496 ml    Last BM: 12/30  Labs:   Recent Labs Lab 12/10/14 2222 12/11/14 0450 12/12/14 0315  NA 127* 126* 123*  K 4.5 3.8 3.8  CL 94* 94* 93*  CO2 26 26 26   BUN 52* 44* 29*  CREATININE 1.16 0.97 0.76  CALCIUM 7.2* 7.3* 7.1*  MG  --  2.1 2.0  PHOS  --  3.0 2.3  GLUCOSE 142* 139* 129*    CBG (last 3)   Recent Labs  12/11/14 1821 12/12/14 0002 12/12/14 0626  GLUCAP 153* 105* 118*    Scheduled Meds: . carvedilol  6.25 mg Oral BID WC  . feeding supplement (ENSURE COMPLETE)  237 mL Oral BID BM  . oxyCODONE  20 mg Oral QID  . pantoprazole  40 mg Oral BID  . predniSONE  30 mg Oral  Daily  . sodium chloride  10-40 mL Intracatheter Q12H  . sodium chloride  1 g Oral BID WC  . sucralfate  1 g Oral TID AC    Continuous Infusions: . sodium chloride 65 mL/hr at 12/11/14 2341  . Marland KitchenTPN (CLINIMIX-E) Adult 60 mL/hr at 12/11/14 1720   And  . fat emulsion 250 mL (12/11/14 1720)    Past Medical History  Diagnosis Date  . 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 Hepatomegaly 05/04/2011  . Fatty liver 05/04/2011    "stage III fatty liver fibrosis" (11/15/2012)  . GERD (gastroesophageal reflux disease)   . Chronic liver disease     /notes 11/15/2012  . Pericarditis      [name] 11/15/2012  . Hypertension   . Pneumonia 1977  . Shortness of breath     "all the time right now" (11/15/2012)  . History of blood transfusion 2004  . History of stomach ulcers   . Duodenal ulcer   . Depression   . Kidney stones     bilaterally/notes 11/15/2012  . Hepatic fibrosis     [name] 11/15/2012  . Anemia   . Pericardial effusion 10/29/2012    moderate to large/notes 11/15/2012  . Anxiety   . Hepatitis   . Crohn's disease   . ED (erectile dysfunction)   . 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 Anal examination under anesthesia  02/11/2011    [name]  . Treatment fistula anal  07/03/11    This was a second surgery to repair Anal fistula  . Gastrojejunostomy  2004    "had hole cut in small intestines during endoscopy; had to have OR & leave me open 3 months" (11/15/2012)  . Cholecystectomy  ~ 2003  . Appendectomy  ~ 2003  . Video assisted thoracoscopy  11/17/2012    Procedure: VIDEO ASSISTED THORACOSCOPY;  Surgeon: Loreli Slot, MD;  Location[facility] OR;  Service: Thoracic;  Laterality: Left;  drainage of left pleural effusion  . Pericardial window  11/17/2012    Procedure: PERICARDIAL WINDOW;  Surgeon: Loreli Slot, MD;  Location: [facility] OR;  Service: Thoracic;  Laterality: N/A;  . Esophagogastroduodenoscopy  01/06/2013    Procedure: ESOPHAGOGASTRODUODENOSCOPY (EGD);  Surgeon: Malissa Hippo, MD;  Location: [facility]ITE;  Service: Endoscopy;  Laterality: N/A;  11:15  . Esophagogastroduodenoscopy N/A 03/09/2014    Dr. Karilyn Cota: erosive/ulcerative reflux esophagiits, portal gastropathy, moderate amount of bile and food debris in stomach precluding visualization of GJ anastomosis  . Gastrostomy tube placement  11/19/14    [facility]: 19 F APDL catheter. Checked again 12/23 via fluoroscopy and in suitable position  . Colonoscopy  Aug 2015    Dr. 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 Surveillance in 1 year  . Exploratory laparotomy  Nov 05, 2014    [facility], Dr. Byrd Hesselbach. Several hour operation with unsuccessful lysis of adhesions due to frozen abdomen    Royann Shivers MS,RD,CSG,LDN Office: [phone] Pager: [phone]

## 2014-12-12 NOTE — Progress Notes (Signed)
Notified by patient that he was bloated and upon assessment patients abdomen was distended. MD was notified. Also notified GI NP and MD. GI MD ordered to stop vital tube feedings, change patients diet to npo with ice chips and sips of liquids. Patient was notified and understood his plan of care at this time. Will continue to monitor patient at this time.

## 2014-12-12 NOTE — Progress Notes (Addendum)
Subjective: Pain improved. Feels bloated. No rectal bleeding. PEG tube placed on 12/29.   Objective: Vital signs in last 24 hours: Temp:  [97.3 F (36.3 C)-98.5 F (36.9 C)] 97.9 F (36.6 C) (12/30 0627) Pulse Rate:  [68-86] 78 (12/30 0627) Resp:  [11-18] 18 (12/30 0627) BP: (84-101)/(49-64) 96/55 mmHg (12/30 0627) SpO2:  [96 %-100 %] 99 % (12/30 0627) Last BM Date: 12/10/14 General:   Alert and oriented, pleasant Head:  Normocephalic and atraumatic. Abdomen:  More distended from prior exam. +BS, mild TTP to palpation diffusely. Steri strips intact, PEG tube in place  Msk:  Symmetrical without gross deformities. Normal posture. Extremities:  1+ lower extremity/pedal edema, improved  Neurologic:  Alert and  oriented x4;  grossly normal neurologically. Psych:  Alert and cooperative. Normal mood and affect.  Intake/Output from previous day: 12/29 0701 - 12/30 0700 In: 3996 [P.O.:684; I.V.:709.3; Blood:742; NG/GT:268.7; BTD:9741] Out: 850 [Urine:650; Drains:200] Intake/Output this shift:    Lab Results:  Recent Labs  12/10/14 1130 12/11/14 0450 12/12/14 0315  WBC 8.7 4.6 4.8  HGB 7.5* 6.4* 7.7*  HCT 23.1* 19.6* 23.8*  PLT 105* 80* 70*   BMET  Recent Labs  12/10/14 2222 12/11/14 0450 12/12/14 0315  NA 127* 126* 123*  K 4.5 3.8 3.8  CL 94* 94* 93*  CO2 26 26 26   GLUCOSE 142* 139* 129*  BUN 52* 44* 29*  CREATININE 1.16 0.97 0.76  CALCIUM 7.2* 7.3* 7.1*   LFT  Recent Labs  12/09/14 1342 12/11/14 0450 12/12/14 0315  PROT 5.0* 4.0* 4.2*  ALBUMIN 2.6* 2.0* 2.2*  AST 31 25 28   ALT 24 20 24   ALKPHOS 105 70 80  BILITOT 0.9 0.7 1.3*   PT/INR  Recent Labs  12/10/14 1130 12/11/14 0450  LABPROT 18.5* 16.7*  INR 1.53* 1.33     Assessment: 38 year old male with history of complicated Crohn's disease with ileal stricture, cirrhosis, IDA, presenting with failure to thrive. Significant weight loss noted with decreased oral intake. Upcoming appointment  in Jan 2016 at [facility] for consideration of liver/small bowel transplant.   Cirrhosis: MELD score 10. Still with hyponatremia but improved from admission; diuretics on hold (as outpatient on Zaroxolyn 5 mg prn and Lasix 40 mg prn). Consideration for liver transplantation with appt at [facility] upcoming. Nephrology consulted per admitting physician.   Anemia: acute on chronic without overt GI bleeding. 4 units PRBCs received total this admission. Hemoccults ordered. CT on file from 12/27 at time of admission. Last EGD in March 2015 and recent colonoscopy Aug 2015.   Failure to thrive: noted weight loss concerning. Standard 20 F PEG tube placed on 12/29 via IR in place of 12 F catheter, which was not draining/decompression adequately. G tube needed for decompression, not feeding. Feeding had been started with Vital 1.2 at 20 ml/hour at some point yesterday evening; however, tube is to be used for decompression only, not feeding.TPN now with no endpoint. Clear liquids by mouth.  Weight noted as 205 in Nov 2015 at [facility], 163 Dec 15, and now 150.   Crohn's disease: complicated course. Failure of multiple medications in past to include 5 ASA, Imuran, methotrexate, Humira, Remicade, Cimzia; currently on Prednisone 30 mg daily. Continue Prednisone 30 mg daily with PPI BID and close follow-up at [facility] and with Dr. Karilyn Cota.   Plan: G-tube to gravity drainage; do not use for enteral feeding / patient not a candidate for oral/enteral feedings at this time May have sips of clears,  ice chips. TPN per pharmacy PPI BID Prednisone 30 mg daily Hemoccult stool Supportive measures [facility] at [facility] on Jan 8th for consideration of dual liver/small bowel transplant.   Nira RetortAnna W. Sams, ANP-BC [facility]    LOS: 3 days    12/12/2014, 9:36 AM  Attending note:  Abdomen less distended with a 20 French venting G-tube.  TPN ongoing.  Hopefully, we may get him out of the hospital by the  weekend. [facility] at [facility] next [facility].

## 2014-12-12 NOTE — Consult Note (Signed)
David MoleMichael A [name] MRN: [id number] DOB/AGE: [birth year] 38 y.o. Primary Care Physician:LUKING,W S, MD Admit date: 12/09/2014 Chief Complaint:  Chief Complaint  Patient presents with  . Hypotension   HPI: Pt is 38 year old male with past medical hx of Crohn's  Disease who presented to ER with c/o weakness.  HPI dates back to 4-5 weeks when  patient's abdominal pain was progressively increasing.Patient later underwent [facility] on 11/05/2014 with plans of lysis adhesions and possible bowel resection, but surgeon  was not able to complete the procedure .Pt had a gastrostomy tube placed percutaneously at that time. Pt was d/ced home and Patient has since been increasingly intolerant of oral foods and liquids.  Pt presented to ER on 12/09/14 with c/o weakness, lightheadedness and dizziness . Pt was started on IVF . Pt later also had Gastric tube exchange. Pt seen today NO c/o hematuria  Pt does c/o nausea  Pt also c/o abdominal pain NO c/o fever/cough/chills.    Past Medical History  Diagnosis Date  . 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 Hepatomegaly 05/04/2011  . Fatty liver 05/04/2011    "stage III fatty liver fibrosis" (11/15/2012)  . GERD (gastroesophageal reflux disease)   . Chronic liver disease     /notes 11/15/2012  . Pericarditis     [name]/notes 11/15/2012  . Hypertension   . Pneumonia 1977  . Shortness of breath     "all the time right now" (11/15/2012)  . History of blood transfusion 2004  . History of stomach ulcers   . Duodenal ulcer   . Depression   . Kidney stones     bilaterally/notes 11/15/2012  . Hepatic fibrosis     [name]/notes 11/15/2012  . Anemia   . Pericardial effusion 10/29/2012    moderate to large/notes 11/15/2012  . Anxiety   . Hepatitis   . Crohn's disease   . ED (erectile dysfunction)   . 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 Diabetes Mother   . Diabetes Brother   . Healthy Daughter   . Healthy Son   . Colon cancer Neg Hx     Social History:  reports that he has been smoking Cigarettes.  He started smoking about 2 years ago. He has a 12 pack-year smoking history. His smokeless tobacco use includes Snuff. He reports that he does not drink alcohol or use illicit drugs.   Allergies:  Allergies  Allergen Reactions  . Remicade [Infliximab]     Dr Karilyn Cotaehman states previous respiratory arrest not related to remicade. Dr Karilyn Cotaehman states remicade not a drug related allergy. 07-07-2013 at 1025 rapid response called to PACU- Patient difficulty breathing after infusion of Remicade infusing. Do NOT Give Remicade!  . Fentanyl And Related Itching    Swelling, Redness  . Alfentanil Itching    Swelling, Redness  . Wellbutrin [Bupropion] Nausea And Vomiting  . Morphine And Related Hives    Medications Prior to Admission  Medication Sig Dispense Refill  . calcium-vitamin D (OSCAL 500/200 D-3) 500-200 MG-UNIT per tablet Take 1 tablet by mouth 2 (two) times daily.    . carvedilol (COREG) 6.25 MG tablet Take 6.25 mg by mouth 2 (two) times daily.     Marland Kitchen. dicyclomine (BENTYL) 10 MG capsule Take 1 capsule (10 mg  total) by mouth 3 (three) times daily as needed (stomach cramps). (Patient taking differently: Take 10 mg by mouth daily as needed for spasms (stomach cramps). ) 90 capsule 5  . ferrous gluconate (FERGON) 324 MG tablet Take 324 mg by mouth 3 (three) times daily.    . fish oil-omega-3 fatty acids 1000 MG capsule Take 1 g by mouth 3 (three) times daily.    . furosemide (LASIX) 40 MG tablet Take 1 tablet (40 mg total) by mouth daily as needed. (Patient taking differently: Take 40 mg by mouth daily as needed for fluid. ) 30 tablet 2  . metoCLOPramide (REGLAN) 5 MG tablet Take 1 tablet (5 mg total) by mouth 3 (three) times daily as needed for nausea. 90 tablet 0  .  metolazone (ZAROXOLYN) 5 MG tablet Take 5 mg by mouth daily as needed. For fluid    . Multiple Vitamin (MULTI-VITAMINS) TABS Take 1 tablet by mouth daily.    . Oxycodone HCl 10 MG TABS Take 10 mg by mouth 4 (four) times daily as needed. For pain  0  . OxyCODONE HCl 20 MG/ML CONC Take 20 mg by mouth 4 (four) times daily. 120 mL 0  . pantoprazole (PROTONIX) 40 MG tablet Take 1 tablet (40 mg total) by mouth 2 (two) times daily. 60 tablet 5  . polyethylene glycol (MIRALAX / GLYCOLAX) packet Take 17 g by mouth daily as needed for mild constipation or moderate constipation.     . potassium chloride (K-DUR,KLOR-CON) 10 MEQ tablet Take 20 mEq by mouth daily.    . predniSONE (DELTASONE) 10 MG tablet Take 30 mg by mouth daily.    . Probiotic Product (ALIGN) 4 MG CAPS Take 4 mg by mouth daily.     . promethazine (PHENERGAN) 25 MG tablet Take 1 tablet (25 mg total) by mouth 2 (two) times daily as needed. nausea 30 tablet 1  . sucralfate (CARAFATE) 1 G tablet Take 1 g by mouth 3 (three) times daily before meals.    . flintstones complete (FLINTSTONES) 60 MG chewable tablet Chew 1 tablet by mouth 2 (two) times daily.         AOZ:HYQMV from the symptoms mentioned above,there are no other symptoms referable to all systems reviewed.  . carvedilol  6.25 mg Oral BID WC  . feeding supplement (ENSURE COMPLETE)  237 mL Oral BID BM  . insulin aspart  0-9 Units Subcutaneous 4 times per day  . levofloxacin  500 mg Oral Daily  . oxyCODONE  20 mg Oral QID  . pantoprazole  40 mg Oral BID  . predniSONE  30 mg Oral Daily  . sodium chloride  10-40 mL Intracatheter Q12H  . sodium chloride  1 g Oral BID WC  . sucralfate  1 g Oral TID AC    Physical Exam: Vital signs in last 24 hours: Temp:  [97.3 F (36.3 C)-98.5 F (36.9 C)] 97.9 F (36.6 C) (12/30 0627) Pulse Rate:  [77-86] 78 (12/30 0627) Resp:  [12-18] 18 (12/30 0627) BP: (84-101)/(49-64) 96/55 mmHg (12/30 0627) SpO2:  [99 %-100 %] 99 % (12/30  0627) Weight change:  Last BM Date: 12/10/14  Intake/Output from previous day: 12/29 0701 - 12/30 0700 In: 3996 [P.O.:684; I.V.:709.3; Blood:742; NG/GT:268.7; TPN:1592] Out: 850 [Urine:650; Drains:200] Total I/O In: 120 [P.O.:120] Out: 225 [Urine:225]   Physical Exam: General- pt is awake,alert, oriented to time place and person Resp- No acute REsp distress, CTA B/L NO Rhonchi CVS- S1S2 regular in rate and rhythm  GIT- Peg tube in situ, BS +,Distended EXT- 1+ LE Edema, NO Cyanosis CNS- CN 2-12 grossly intact. Moving all 4 extremities Psych- normal mood and affect    Lab Results: CBC  Recent Labs  12/11/14 0450 12/12/14 0315  WBC 4.6 4.8  HGB 6.4* 7.7*  HCT 19.6* 23.8*  PLT 80* 70*    BMET  Recent Labs  12/12/14 0315 12/12/14 1110  NA 123* 130*  K 3.8 4.3  CL 93* 98  CO2 26 26  GLUCOSE 129* 155*  BUN 29* 25*  CREATININE 0.76 0.78  CALCIUM 7.1* 7.6*    Trend  BUN 79=>29                   12/27        12/28         12/29           12/30 Sodium      121=>      127=>       126=>       123=>130   Creat 1.62=>0.78  MICRO Recent Results (from the past 240 hour(s))  Blood culture (routine x 2)     Status: None (Preliminary result)   Collection Time: 12/09/14  5:36 PM  Result Value Ref Range Status   Specimen Description BLOOD RIGHT ANTECUBITAL  Final   Special Requests BOTTLES DRAWN AEROBIC AND ANAEROBIC 10CC  Final   Culture NO GROWTH 3 DAYS  Final   Report Status PENDING  Incomplete  Blood culture (routine x 2)     Status: None (Preliminary result)   Collection Time: 12/09/14  5:45 PM  Result Value Ref Range Status   Specimen Description BLOOD LEFT HAND  Final   Special Requests BOTTLES DRAWN AEROBIC ONLY 5CC  Final   Culture NO GROWTH 3 DAYS  Final   Report Status PENDING  Incomplete      Lab Results  Component Value Date   CALCIUM 7.6* 12/12/2014   PHOS 2.3 12/12/2014  Albumin 2 Corrected calcium 7.6+ 1.6=9.2    Impression: 1)Renal   AKI secondary to Prerenal.                AKI now better                  2)Hypotension-Bp on lower side but better than before.  No need of pressors yet   3)Anemia HGb at goal (9--11) An of chronic ds ( Chron's disease)   4)Hyponatremia        Etiology             Hypovolemic Hyponatremia                 - Pt responded to IVF                           Sodium improved                            Creat improved                            BUN improved               Trending down now from past 2 days                 Etiology  Hypovolemic still ?                                            Pt was transferred top [place] yesterday for G tube exchange.                 Not much IVF because of of this.                       Hypervolemia                           Is 6+ liters Positive                                  Edema +                        5)GI- Hx of Severe Chron's ds    Gi and  Primary MD following  6)Acid base Co2 at goal   7) Hypocalcemia        Corrected calcium within goal.  8) ID CXR Positive for Infiltrates    initiated on ABX    Plan:  Will continue with current IVF rate as sodium better. Will ask for urine sodium & Creat Will ask for BMet . Will ask for TSH Will ask for anemia profile     Aeon Koors S 12/12/2014, 1:03 PM

## 2014-12-12 NOTE — Progress Notes (Addendum)
David SNELGROVE THY:[id number] DOB: [birth year] DOA: 12/09/2014 PCP: [name], MD  Brief narrative: 38 y/o ? 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 Over that period of time he progressively got weaker and weaker and felt more ill or nauseous and was unable to keep down much by mouth he His last meal was yesterday and was grits in the morning and he only had 4-5 bites. Of note he has been taking relatively large doses of Roxanol 20 mg 4 times a day which has helped his pain  Past medical history-As per Problem list Chart reviewed as below-   Consultants:  GI  nephrology  Procedures:  none  Antibiotics:  none   Subjective   Looks better however feels bloated and swollen in stomach, upper hips Tolerating oral ice chips Parenteral nutrition discontinued in the form of venting tube feeds as this is to decompress by GI No chest pain no fever no chills     Objective    Interim History: None  Telemetry: None telemetry   Objective: Filed Vitals:   12/11/14 2025 12/11/14 2100 12/11/14 2322 12/12/14 0627  BP: 100/58 96/56 96/63  96/55  Pulse: 86 81 84 78  Temp: 98.3 F (36.8 C) 98 F (36.7 C) 98.3 F (36.8 C) 97.9 F (36.6 C)  TempSrc: Oral Oral Oral Oral  Resp: 18 18 18 18   Height:      Weight:      SpO2: 100% 100% 100% 99%    Intake/Output Summary (Last 24 hours) at 12/12/14 1250 Last data filed at 12/12/14 1117  Gross per 24 hour  Intake   4116 ml    Output    725 ml  Net   3391 ml    Exam:  General: EOMI NCAT, temporalis and supraclavicular fossa wasting Cardiovascular: S1-S2 no murmur rub or gallop Respiratory: Clinically clear no added sound, PICC line present Abdomen: Soft but distended, staples in the midline appeared to be well opposed Skin none Neuro intact  Data Reviewed: Basic Metabolic Panel:  Recent Labs Lab 12/10/14 1713 12/10/14 2222 12/11/14 0450 12/12/14 0315 12/12/14 1110  NA 128* 127* 126* 123* 130*  K 4.5 4.5 3.8 3.8 4.3  CL 97 94* 94* 93* 98  CO2 25 26 26 26 26   GLUCOSE 117* 142* 139* 129* 155*  BUN 55* 52* 44* 29* 25*  CREATININE 1.11 1.16 0.97 0.76 0.78  CALCIUM 6.7* 7.2* 7.3* 7.1* 7.6*  MG  --   --  2.1 2.0  --   PHOS  --   --  3.0 2.3  --    Liver Function Tests:  Recent Labs Lab 12/09/14 1342 12/11/14 0450 12/12/14 0315  AST 31 25 28   ALT 24 20 24   ALKPHOS 105 70 80  BILITOT 0.9 0.7 1.3*  PROT 5.0* 4.0* 4.2*  ALBUMIN 2.6* 2.0* 2.2*   No results for input(s): LIPASE, AMYLASE in the last 168 hours. No results for input(s): AMMONIA in the last 168  hours. CBC:  Recent Labs Lab 12/09/14 1342 12/10/14 0701 12/10/14 1130 12/11/14 0450 12/12/14 0315  WBC 19.5* 9.5 8.7 4.6 4.8  NEUTROABS 16.7*  --  7.6 3.3 3.6  HGB 7.1* 7.3* 7.5* 6.4* 7.7*  HCT 22.4* 22.5* 23.1* 19.6* 23.8*  MCV 77.0* 78.7 78.3 78.4 80.4  PLT 199 107* 105* 80* 70*   Cardiac Enzymes: No results for input(s): CKTOTAL, CKMB, CKMBINDEX, TROPONINI in the last 168 hours. BNP: Invalid input(s): POCBNP CBG:  Recent Labs Lab 12/11/14 1821 12/12/14 0002 12/12/14 0626 12/12/14 1131  GLUCAP 153* 105* 118* 145*    Recent Results (from the past 240 hour(s))  Blood culture (routine x 2)     Status: None (Preliminary result)   Collection Time: 12/09/14  5:36 PM  Result Value Ref Range Status   Specimen Description BLOOD RIGHT ANTECUBITAL  Final   Special Requests BOTTLES DRAWN AEROBIC AND ANAEROBIC 10CC  Final    Culture NO GROWTH 3 DAYS  Final   Report Status PENDING  Incomplete  Blood culture (routine x 2)     Status: None (Preliminary result)   Collection Time: 12/09/14  5:45 PM  Result Value Ref Range Status   Specimen Description BLOOD LEFT HAND  Final   Special Requests BOTTLES DRAWN AEROBIC ONLY 5CC  Final   Culture NO GROWTH 3 DAYS  Final   Report Status PENDING  Incomplete     Studies:              All Imaging reviewed and is as per above notation   Scheduled Meds: . carvedilol  6.25 mg Oral BID WC  . feeding supplement (ENSURE COMPLETE)  237 mL Oral BID BM  . insulin aspart  0-9 Units Subcutaneous 4 times per day  . [START ON 12/13/2014] levofloxacin  500 mg Oral Daily  . oxyCODONE  20 mg Oral QID  . pantoprazole  40 mg Oral BID  . predniSONE  30 mg Oral Daily  . sodium chloride  10-40 mL Intracatheter Q12H  . sodium chloride  1 g Oral BID WC  . sucralfate  1 g Oral TID AC   Continuous Infusions: . sodium chloride    . Marland Kitchen.TPN (CLINIMIX-E) Adult 60 mL/hr at 12/11/14 1720   And  . fat emulsion 250 mL (12/11/14 1720)  . Marland Kitchen.TPN (CLINIMIX-E) Adult     And  . fat emulsion       Assessment/Plan: 1. Crohn's disease-[facility], [facility]-sees Dr. Karilyn Cot[place]hman [place] every month for maintenance.  Agree with TPN. Order placed for PICC line. Patient to keep venting G-tube in place [facility] not to be used for feeds. Continue prednisone 30 mg daily 2. Likely anemia of chronic disease and microcytosis from iron deficiency with superimposed acute component -transfused 2 units packed red blood cells on 12/28 +12/29. Note that patient also has cirrhosis and is malnourished which may be the etiology of this. Unfortunately anemia panel was not done prior to transfusion .  Hemoccults pending-rest pe rGI 3. Possible pneumonia? Afebrile without chills or fever Chest x-ray 12/27 shows pneumonia. This is  confirmed on CT scan. This is chronic-has had this since February 2015 when he was aspirating with feeds. We will start Levaquin 500 every 24 empirically 10-14 d 4. Htn-Hypotensive 2/2 to vol depletion-d/c Coreg 6.25 on 12/30 5. Severe protein energy malnutrition with wasting and hyponatremia-high risk for refeeding syndrome. Prealbumin 9.9,  triglycerides 87, magnesium 2.0, phosphorus 2.3 6. Acute hyponatremia-probably secondary to poor solute intake with liberalized by mouth water-resolved c Salt tabs.  WOuld consider fluid restriction as well as possible further diuresis with PO lasix once rpt labs from this pm return 7. 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 He will need to be managed holistically by gastroenterology. Given 12/29 Lasix 20 mg IV for aquaresis. He is to continue Coreg although may benefit from a nonselective beta blocker for secondary variceal bleed prevention 8. Abnasarca-feeling more bloated as well as "full"  Has shifting dullness 12/30.  See #5--Dose Lasix 40 mg po now 9. Gastroparesis?-Continue Reglan 5 mg 3 times a day,Probably not helped by chronic opiate meds 10. Reflux continue pantoprazole 40 twice a day , continue sucralfate 1 g 3 times a day   Code Status: full Family Communication:  Discussed with patint Disposition Plan: inpatient-Per GI.  He would like to d/c home for [facility], [facility]  [facility] Pager [phone] 12/12/2014, 12:50 PM    LOS: 3 days

## 2014-12-12 NOTE — Care Management Note (Unsigned)
    Page 1 of 2   12/14/2014     4:14:36 PM CARE MANAGEMENT NOTE 12/14/2014  Patient:  David [name], David [name]   Account Number:  [id number]  Date Initiated:  12/12/2014  Documentation initiated by:  Sharrie Rothman  Subjective/Objective Assessment:   Pt admitted from home with hyponatremia. Pt lives with his wife and children and will return home at discharge. Pt is independent with ADl's.     Action/Plan:   Pt will probably need TPN at discharge. Pt has chosen [facility] for [facility] RN and PT to follow TPN. Will continue to follow for discharge planning needs.   Anticipated DC Date:  12/14/2014   Anticipated DC Plan:  HOME W HOME HEALTH SERVICES      DC Planning Services  CM consult      [facility] Choice  HOME HEALTH  DURABLE MEDICAL EQUIPMENT   Choice offered to / List presented to:  C-1 Patient   DME arranged  IV PUMP/EQUIPMENT      DME agency  [facility]     [facility] arranged  HH-1 RN      [facility] agency  [facility]   Status of service:  Completed, signed off Medicare Important Message given?  YES (If response is "NO", the following Medicare IM given date fields will be blank) Date Medicare IM given:  12/13/2014 Medicare IM given by:  Anibal Henderson Date Additional Medicare IM given:   Additional Medicare IM given by:    Discharge Disposition:  HOME W HOME HEALTH SERVICES  Per UR Regulation:  Reviewed for med. necessity/level of care/duration of stay  If discussed at Long Length of Stay Meetings, dates discussed:    Comments:  12/14/14 1600 Anibal Henderson RN/CM Faxed information to [facility] with current TPN orders,  to pharmacy,   x 3 due to intake said x 1 and x 2 that they did not get info, but called back after faxed 3rd time and spoke with Harlin Heys [name] second time and she said they had info and had been working on it for 2 hrs! Pt OK with not going home today if TPN not ready in time today.  12/13/14  1100 Criselda Starke RN/CM Pt will need HH to follow TPN, and PT for  strengthening 12/12/14 1105 Arlyss Queen, Charity fundraiser BSN CM

## 2014-12-13 DIAGNOSIS — K7469 Other cirrhosis of liver: Secondary | ICD-10-CM | POA: Insufficient documentation

## 2014-12-13 DIAGNOSIS — D649 Anemia, unspecified: Secondary | ICD-10-CM

## 2014-12-13 LAB — COMPREHENSIVE METABOLIC PANEL
ALK PHOS: 91 U/L (ref 39–117)
ALT: 29 U/L (ref 0–53)
ANION GAP: 5 (ref 5–15)
AST: 30 U/L (ref 0–37)
Albumin: 2.3 g/dL — ABNORMAL LOW (ref 3.5–5.2)
BILIRUBIN TOTAL: 0.7 mg/dL (ref 0.3–1.2)
BUN: 21 mg/dL (ref 6–23)
CALCIUM: 7.4 mg/dL — AB (ref 8.4–10.5)
CHLORIDE: 97 meq/L (ref 96–112)
CO2: 27 mmol/L (ref 19–32)
CREATININE: 0.61 mg/dL (ref 0.50–1.35)
GFR calc Af Amer: >90 mL/min (ref >90)
GFR calc non Af Amer: >90 mL/min (ref >90)
Glucose, Bld: 119 mg/dL — ABNORMAL HIGH (ref 70–99)
Potassium: 3.2 mmol/L — ABNORMAL LOW (ref 3.5–5.1)
Sodium: 129 mmol/L — ABNORMAL LOW (ref 135–145)
Total Protein: 4.6 g/dL — ABNORMAL LOW (ref 6.0–8.3)

## 2014-12-13 LAB — CBC WITH DIFFERENTIAL/PLATELET
BASOS ABS: 0 10*3/uL (ref 0.0–0.1)
Basophils Relative: 0 % (ref 0–1)
EOS PCT: 1 % (ref 0–5)
Eosinophils Absolute: 0.1 10*3/uL (ref 0.0–0.7)
HCT: 25.9 % — ABNORMAL LOW (ref 39.0–52.0)
HEMOGLOBIN: 8.4 g/dL — AB (ref 13.0–17.0)
Lymphocytes Relative: 12 % (ref 12–46)
Lymphs Abs: 0.9 10*3/uL (ref 0.7–4.0)
MCH: 26.3 pg (ref 26.0–34.0)
MCHC: 32.4 g/dL (ref 30.0–36.0)
MCV: 81.2 fL (ref 78.0–100.0)
MONOS PCT: 11 % (ref 3–12)
Monocytes Absolute: 0.8 10*3/uL (ref 0.1–1.0)
NEUTROS PCT: 76 % (ref 43–77)
Neutro Abs: 5.5 10*3/uL (ref 1.7–7.7)
PLATELETS: 88 10*3/uL — AB (ref 150–400)
RBC: 3.19 MIL/uL — ABNORMAL LOW (ref 4.22–5.81)
RDW: 16.6 % — AB (ref 11.5–15.5)
Smear Review: DECREASED
WBC: 7.2 10*3/uL (ref 4.0–10.5)

## 2014-12-13 LAB — GLUCOSE, CAPILLARY
GLUCOSE-CAPILLARY: 126 mg/dL — AB (ref 70–99)
GLUCOSE-CAPILLARY: 141 mg/dL — AB (ref 70–99)
Glucose-Capillary: 116 mg/dL — ABNORMAL HIGH (ref 70–99)
Glucose-Capillary: 165 mg/dL — ABNORMAL HIGH (ref 70–99)
Glucose-Capillary: 208 mg/dL — ABNORMAL HIGH (ref 70–99)

## 2014-12-13 LAB — PHOSPHORUS: Phosphorus: 1.9 mg/dL — ABNORMAL LOW (ref 2.3–4.6)

## 2014-12-13 LAB — IRON AND TIBC
Iron: 17 ug/dL — ABNORMAL LOW (ref 42–165)
Saturation Ratios: 5 % — ABNORMAL LOW (ref 20–55)
TIBC: 314 ug/dL (ref 215–435)
UIBC: 297 ug/dL (ref 125–400)

## 2014-12-13 LAB — MAGNESIUM: Magnesium: 1.7 mg/dL (ref 1.5–2.5)

## 2014-12-13 LAB — FOLATE: Folate: 9.7 ng/mL

## 2014-12-13 LAB — VITAMIN B12: Vitamin B-12: 1346 pg/mL — ABNORMAL HIGH (ref 211–911)

## 2014-12-13 LAB — FERRITIN: Ferritin: 18 ng/mL — ABNORMAL LOW (ref 22–322)

## 2014-12-13 MED ORDER — POTASSIUM CHLORIDE 10 MEQ/100ML IV SOLN
10.0000 meq | INTRAVENOUS | Status: AC
  Administered 2014-12-13 (×3): 10 meq via INTRAVENOUS
  Filled 2014-12-13 (×3): qty 100

## 2014-12-13 MED ORDER — FERUMOXYTOL INJECTION 510 MG/17 ML
510.0000 mg | Freq: Once | INTRAVENOUS | Status: AC
  Administered 2014-12-13: 510 mg via INTRAVENOUS
  Filled 2014-12-13: qty 17

## 2014-12-13 MED ORDER — SODIUM CHLORIDE 3 % IV SOLN
INTRAVENOUS | Status: DC
  Filled 2014-12-13: qty 500

## 2014-12-13 MED ORDER — TRACE MINERALS CR-CU-F-FE-I-MN-MO-SE-ZN IV SOLN
INTRAVENOUS | Status: AC
  Administered 2014-12-13: 18:00:00 via INTRAVENOUS
  Filled 2014-12-13: qty 2000

## 2014-12-13 MED ORDER — SUCRALFATE 1 GM/10ML PO SUSP
1.0000 g | Freq: Three times a day (TID) | ORAL | Status: DC
  Administered 2014-12-13 – 2014-12-14 (×5): 1 g via ORAL
  Filled 2014-12-13 (×5): qty 10

## 2014-12-13 MED ORDER — HYDROMORPHONE HCL 1 MG/ML IJ SOLN
2.0000 mg | INTRAMUSCULAR | Status: DC | PRN
  Administered 2014-12-13 – 2014-12-14 (×13): 2 mg via INTRAVENOUS
  Filled 2014-12-13 (×14): qty 2

## 2014-12-13 MED ORDER — FAT EMULSION 20 % IV EMUL
250.0000 mL | INTRAVENOUS | Status: AC
  Administered 2014-12-13: 250 mL via INTRAVENOUS
  Filled 2014-12-13 (×2): qty 250

## 2014-12-13 MED ORDER — POTASSIUM PHOSPHATES 15 MMOLE/5ML IV SOLN
10.0000 mmol | Freq: Once | INTRAVENOUS | Status: DC
  Filled 2014-12-13: qty 3.33

## 2014-12-13 MED ORDER — SODIUM CHLORIDE 3 % IV SOLN
INTRAVENOUS | Status: AC
  Administered 2014-12-13: 50 mL/h via INTRAVENOUS
  Filled 2014-12-13 (×2): qty 500

## 2014-12-13 MED ORDER — POTASSIUM PHOSPHATES 15 MMOLE/5ML IV SOLN
10.0000 mmol | Freq: Once | INTRAVENOUS | Status: AC
  Administered 2014-12-13: 10 mmol via INTRAVENOUS
  Filled 2014-12-13: qty 3.33

## 2014-12-13 NOTE — Progress Notes (Addendum)
EON ZUNKER UKG:[id number] DOB: January 04, 1976 DOA: 12/09/2014 PCP: [name], MD  Brief narrative: 38 y/o ? 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 Over that period of time he progressively got weaker and weaker and felt more ill or nauseous and was unable to keep down much by mouth he His last meal was yesterday and was grits in the morning and he only had 4-5 bites. Of note he has been taking relatively large doses of Roxanol 20 mg 4 times a day which has helped his pain He was found initially on admission to have profound anemia hemoglobin 6.4-7.1 with his baseline on 12/22 being 9.9 and 11.4 and 07/13/14 this was thought to be an represent up your red cell aplasia/lack of blood count based on his iron studies on 12/30. IV iron was started on 12/31. He was started on TPN, his venting tube in his abdomen was exchanged to a 20 [place] gastrostomy under fluoroscopy on 12/29 Because of his significant uremia initially as well as hyponatremia, nephrology was consulted  Past medical history-As per Problem list Chart reviewed as below-   Consultants:  GI  nephrology  Procedures:  none  Antibiotics:  none   Subjective   Looks better however feels bloated and swollen in stomach, upper hips Thinks that his tube needs to be vented Tolerating coffee Stomach still feels bloated Lower extremities are swollen Has discomfort in his face neck and head and thinks that his hemoglobin may  have dropped further which are his symptoms CBC from a.m. is pending     Objective    Interim History: None  Telemetry: None telemetry   Objective: Filed Vitals:   12/12/14 1446 12/12/14 1723 12/12/14 2215 12/13/14 0616  BP: 94/54 100/63 94/60 107/60  Pulse: 79  89 85  Temp: 98.4 F (36.9 C)  98.2 F (36.8 C) 98.9 F (37.2 C)  TempSrc: Oral  Oral Oral  Resp: 18  18 20   Height:      Weight:      SpO2: 100%  100% 100%    Intake/Output Summary (Last 24 hours) at 12/13/14 1028 Last data filed at 12/13/14 0838  Gross per 24 hour  Intake   1570 ml  Output   2345 ml  Net   -775 ml    Exam:  General: EOMI NCAT, temporalis and supraclavicular fossa wasting Cardiovascular: S1-S2 no murmur rub or gallop Respiratory: Clinically clear no added sound, PICC line present Abdomen: Soft but distended, staples in the midline appeared to be well opposed, patient has shifting dullness  Skin anasarca lower extremities Neuro intact  Data Reviewed: Basic Metabolic Panel:  Recent Labs Lab 12/11/14 0450 12/12/14 0315 12/12/14 1110 12/12/14 1354 12/13/14 0440  NA 126* 123* 130* 128* 129*  K 3.8 3.8 4.3 4.8 3.2*  CL 94* 93* 98 97 97  CO2 26 26 26 26 27   GLUCOSE 139* 129* 155* 132* 119*  BUN 44* 29* 25* 24* 21  CREATININE 0.97  0.76 0.78 0.72 0.61  CALCIUM 7.3* 7.1* 7.6* 7.5* 7.4*  MG 2.1 2.0  --   --  1.7  PHOS 3.0 2.3  --   --  1.9*   Liver Function Tests:  Recent Labs Lab 12/09/14 1342 12/11/14 0450 12/12/14 0315 12/13/14 0440  AST 31 25 28 30   ALT 24 20 24 29   ALKPHOS 105 70 80 91  BILITOT 0.9 0.7 1.3* 0.7  PROT 5.0* 4.0* 4.2* 4.6*  ALBUMIN 2.6* 2.0* 2.2* 2.3*   No results for input(s): LIPASE, AMYLASE in the last 168 hours. No results for input(s): AMMONIA in the last 168 hours. CBC:  Recent Labs Lab 12/09/14 1342 12/10/14 0701 12/10/14 1130 12/11/14 0450 12/12/14 0315  WBC 19.5* 9.5 8.7 4.6 4.8  NEUTROABS 16.7*  --  7.6 3.3 3.6  HGB 7.1* 7.3*  7.5* 6.4* 7.7*  HCT 22.4* 22.5* 23.1* 19.6* 23.8*  MCV 77.0* 78.7 78.3 78.4 80.4  PLT 199 107* 105* 80* 70*   Cardiac Enzymes: No results for input(s): CKTOTAL, CKMB, CKMBINDEX, TROPONINI in the last 168 hours. BNP: Invalid input(s): POCBNP CBG:  Recent Labs Lab 12/12/14 0626 12/12/14 1131 12/12/14 1643 12/13/14 0042 12/13/14 0508  GLUCAP 118* 145* 163* 141* 126*    Recent Results (from the past 240 hour(s))  Blood culture (routine x 2)     Status: None (Preliminary result)   Collection Time: 12/09/14  5:36 PM  Result Value Ref Range Status   Specimen Description BLOOD RIGHT ANTECUBITAL  Final   Special Requests BOTTLES DRAWN AEROBIC AND ANAEROBIC 10CC  Final   Culture NO GROWTH 4 DAYS  Final   Report Status PENDING  Incomplete  Blood culture (routine x 2)     Status: None (Preliminary result)   Collection Time: 12/09/14  5:45 PM  Result Value Ref Range Status   Specimen Description BLOOD LEFT HAND  Final   Special Requests BOTTLES DRAWN AEROBIC ONLY 5CC  Final   Culture NO GROWTH 4 DAYS  Final   Report Status PENDING  Incomplete     Studies:              All Imaging reviewed and is as per above notation   Scheduled Meds: . feeding supplement (ENSURE COMPLETE)  237 mL Oral BID BM  . furosemide  40 mg Oral Daily  . insulin aspart  0-9 Units Subcutaneous 4 times per day  . levofloxacin  500 mg Oral Daily  . oxyCODONE  20 mg Oral QID  . pantoprazole  40 mg Oral BID  . potassium chloride  10 mEq Intravenous Q1 Hr x 3  . potassium phosphate IVPB (mmol)  10 mmol Intravenous Once  . predniSONE  30 mg Oral Daily  . sodium chloride  10-40 mL Intracatheter Q12H  . sodium chloride  1 g Oral BID WC  . sucralfate  1 g Oral TID AC   Continuous Infusions: . Marland KitchenTPN (CLINIMIX-E) Adult 70 mL/hr at 12/12/14 1710   And  . fat emulsion 250 mL (12/12/14 1711)  . sodium chloride (hypertonic)       Assessment/Plan: 1. Crohn's disease-[facility], has been seen at [facility] in the past-sees Dr. Karilyn Cota in [place] every month for maintenance.  Agree with TPN. Order placed for PICC line. Patient to keep venting G-tube in place until returns to [facility] not to be used for feeds. Continue prednisone 30 mg daily 2. Likely anemia of chronic disease and microcytosis from  iron deficiency with superimposed acute component -transfused 2 units packed red blood cells on 12/28 +12/29. Note that patient also has cirrhosis and is malnourished which may be the etiology of this. Unfortunately anemia panel was not done prior to transfusion .  Hemoccults pending-rest per GI--as he feels symptomatic today with perioral numbness headache, he may benefit from another transfusion and CBC from this morning is pending 3. Possible pneumonia? Afebrile without chills or fever Chest x-ray 12/27 shows pneumonia. This is confirmed on CT scan. This is chronic-has had this since February 2015 when he was aspirating with feeds. We will start Levaquin 500 every 24 empirically 10-14 d 4. Htn-Hypotensive 2/2 to vol depletion ABLA we discontinued Coreg 6.25 on 12/30 5. Severe protein energy malnutrition with wasting and hyponatremia-high risk for refeeding syndrome. Prealbumin 9.9, triglycerides 87, magnesium 2.0, phosphorus 2.3--patient currently on TPN and we will replace trace elements such as salt and potassium via pharmacy . Appreciate input  6. Acute hyponatremia-probably secondary to poor solute intake with liberalized by mouth water-resolved c Salt tabs. 7. 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 Marland Kitchen. He will need to be managed holistically by gastroenterology. Given 12/29 Lasix 20 mg IV for aquaresis.  his hypotension prevents use of nonselective beta blocker for secondary variceal bleed prevention 8. Anasarca-feeling more bloated as well as "full"  Has shifting dullness 12/30.  See #5--Dose Lasix 40 mg po now And daily... His weights  are not accurate. He was 68 kg on 12/27 and is currently 78 kg RN aware to reweigh him 9. .Gastroparesis?-Continue Reglan 5 mg 3 times a day,Probably not helped by chronic opiate meds 10. Reflux continue pantoprazole 40 twice a day , continue sucralfate 1 g 3 times a day   60 minutes prolonged time total spent coordinating this patient's care  Code Status: full Family Communication:  Discussed with patint Disposition Plan:Inpatient., Might need transfusion, will need replacement of electrolytes and will consider therapeutic paracentesis 12/14/14 if his bloating does not resolve  Pleas KochJai Raif Chachere, MD  [facility] Pager [phone] 12/13/2014, 10:28 AM    LOS: 4 days

## 2014-12-13 NOTE — Progress Notes (Signed)
Subjective:  Patient complains of fatigue and abdominal bloating. Good BM this last night.   Objective: Vital signs in last 24 hours: Temp:  [98.2 F (36.8 C)-98.9 F (37.2 C)] 98.9 F (37.2 C) (12/31 0616) Pulse Rate:  [79-89] 85 (12/31 0616) Resp:  [18-20] 20 (12/31 0616) BP: (94-107)/(54-63) 107/60 mmHg (12/31 0616) SpO2:  [100 %] 100 % (12/31 0616) Weight:  [173 lb 6.4 oz (78.654 kg)] 173 lb 6.4 oz (78.654 kg) (12/30 1300) Last BM Date: 12/12/14 General:   Alert,  Chronically ill-appearing WM, pleasant and cooperative in NAD Head:  Normocephalic and atraumatic. Eyes:  Sclera clear, no icterus.  Abdomen:  Soft, nontender and nondistended. No masses, hepatosplenomegaly or hernias noted. Normal bowel sounds, without guarding, and without rebound.   Extremities:  Without clubbing, deformity or edema. Neurologic:  Alert and  oriented x4;  grossly normal neurologically. Skin:  Intact without significant lesions or rashes. Psych:  Alert and cooperative. Normal mood and affect.  Intake/Output from previous day: 12/30 0701 - 12/31 0700 In: 1800 [P.O.:360; TPN:1440] Out: 2295 [Urine:2295] Intake/Output this shift:    Lab Results: CBC  Recent Labs  12/10/14 1130 12/11/14 0450 12/12/14 0315  WBC 8.7 4.6 4.8  HGB 7.5* 6.4* 7.7*  HCT 23.1* 19.6* 23.8*  MCV 78.3 78.4 80.4  PLT 105* 80* 70*   BMET  Recent Labs  12/12/14 1110 12/12/14 1354 12/13/14 0440  NA 130* 128* 129*  K 4.3 4.8 3.2*  CL 98 97 97  CO2 26 26 27   GLUCOSE 155* 132* 119*  BUN 25* 24* 21  CREATININE 0.78 0.72 0.61  CALCIUM 7.6* 7.5* 7.4*   LFTs  Recent Labs  12/11/14 0450 12/12/14 0315 12/13/14 0440  BILITOT 0.7 1.3* 0.7  ALKPHOS 70 80 91  AST 25 28 30   ALT 20 24 29   PROT 4.0* 4.2* 4.6*  ALBUMIN 2.0* 2.2* 2.3*   No results for input(s): LIPASE in the last 72 hours. PT/INR  Recent Labs  12/10/14 1130 12/11/14 0450  LABPROT 18.5* 16.7*  INR 1.53* 1.33      Imaging Studies: Ct  Abdomen Pelvis Wo Contrast  12/09/2014   CLINICAL DATA:  Weakness for 4 days with low-grade fever, history of Crohn's disease, recent gastrostomy placement  EXAM: CT ABDOMEN AND PELVIS WITHOUT CONTRAST  TECHNIQUE: Multidetector CT imaging of the abdomen and pelvis was performed following the standard protocol without IV contrast.  COMPARISON:  03/07/2014, 10/15/2014  FINDINGS: Lung bases again demonstrate diffuse reticulonodular airspace disease particularly in the right lower lobe but to a lesser degree in the left lower lobe stable from the recent MRI examination.  Gallbladder has been surgically removed. The liver is stable in appearance. The spleen is enlarged in size but stable from the prior MRI examination. Stomach is significantly distended and a gastrostomy catheter is noted in place. The adrenal glands and pancreas are within normal limits. The kidneys are well visualized bilaterally and reveal some cystic change on the right. Fullness of the collecting systems is noted bilaterally although no definitive obstructive changes are seen. No ureteral calculi are seen. Mild ascites is noted.  There is some generalized distention of the small bowel without definitive obstructive change. Changes are similar to that seen on the prior exam. Changes may be related to the patient's given clinical history of Crohn's disease. This appearance is stable from the prior exam bony structures are stable in appearance. The appendix is not visualized consistent with prior cholecystectomy.  IMPRESSION: Splenomegaly likely related  to a degree of portal hypertension. This is stable from previous exams.  Gastrostomy catheter in place within a distended stomach.  Mild fullness of the collecting systems bilaterally although no true obstructive changes are seen.  Generalized prominence of the small bowel which is stable when compared with prior exams. This may be related to the patient's underlying Crohn's disease.  Mild ascites  stable from the prior exam.   Electronically Signed   By: Alcide Clever M.D.   On: 12/09/2014 17:47   Ir Replc Gastro/colonic Tube Percut W/fluoro  12/11/2014   CLINICAL DATA:  Crohn's disease with small bowel obstruction. Percutaneous gastrostomy tube placed 3 weeks ago for decompression. Recurrent occlusions of the 12 French catheter. Exchange and up sizing is requested.  EXAM: GASTROSTOMY CATHETER REPLACEMENT  FLUOROSCOPY TIME:  2 min 18 seconds  TECHNIQUE: The procedure, risks, benefits, and alternatives were explained to the patient. Questions regarding the procedure were encouraged and answered. The patient understands and consents to the procedure. A 5 French angiographic catheter was placed as orogastric tube. The upper abdomen was prepped with Betadine, draped in usual sterile fashion, and infiltrated locally with 1% lidocaine.  Intravenous Fentanyl and Versed were administered as conscious sedation during continuous cardiorespiratory monitoring by the radiology RN, with a total moderate sedation time of less than 30 minutes.  The external portion of the previously placed 12 French gastrostomy catheter was cut. The 6 French snare sheath could not be advanced beyond the tip of the pigtail catheter within the lumen of the stomach. An Amplatz guidewire was advanced and the catheter exchanged for a 12 French peel-away sheath. Through this, the snare device was advanced and used to snare a guidewire passed through the orogastric tube. This was withdrawn, and the snare attached to the 20 French pull-through gastrostomy tube, which was advanced antegrade, positioned with the internal bumper securing the anterior gastric wall to the anterior abdominal wall. Small contrast injection confirms appropriate positioning. The external bumper was applied and the catheter was flushed. No immediate complication.  IMPRESSION: 1. Technically successful gastrostomy exchange under fluoroscopy for a 20 French pull-through  device, OK for routine use.   Electronically Signed   By: Oley Balm M.D.   On: 12/11/2014 16:14   Dg Chest Portable 1 View  12/09/2014   CLINICAL DATA:  Decreased oral intake, generalized weakness  EXAM: PORTABLE CHEST - 1 VIEW  COMPARISON:  08/21/2014  FINDINGS: Cardiac shadow is stable. The left lung is clear. The right lung is well aerated but demonstrates some patchy infiltrative changes in the right lung base which may be related aspiration pneumonia. Clinical correlation is recommended.  IMPRESSION: New right basilar infiltrative change. This may represent aspiration pneumonia. Clinical correlation is recommended.   Electronically Signed   By: Alcide Clever M.D.   On: 12/09/2014 14:58  [2 weeks]   Assessment: 38 year old male with history of complicated Crohn's disease with ileal stricture, cirrhosis, IDA, presenting with failure to thrive. Significant weight loss noted with decreased oral intake. Upcoming appointment in Jan 2016 at [facility] for consideration of liver/small bowel transplant.   Cirrhosis: MELD score 10. Still with hyponatremia but improved from admission; diuretics had been on hold (as outpatient on Zaroxolyn 5 mg prn and Lasix 40 mg prn) but lasix 40mg  daily restarted yesterday. Consideration for liver transplantation with appt at [facility] upcoming. Nephrology consulted per admitting physician.   Anemia: acute on chronic without overt GI bleeding. 4 units PRBCs received total this admission. Hemoccults ordered. CT  on file from 12/27 at time of admission. Last EGD in March 2015 and recent colonoscopy Aug 2015. Remains symptomatic.   Failure to thrive: noted weight loss concerning. Standard 20 F PEG tube placed on 12/29 via IR in place of 12 F catheter, which was not draining/decompression adequately. G tube needed for decompression, not feeding. TPN now with no endpoint. Clear liquids by mouth/sips only. [facility], 163 Dec 15, and now 150.    Crohn's disease: complicated course. Failure of multiple medications in past to include 5 ASA, Imuran, methotrexate, Humira, Remicade, Cimzia; currently on Prednisone 30 mg daily. Continue Prednisone 30 mg daily with PPI BID and [facility] Karilyn Cotaehman.   Plan: 1. Place G-tube to gravity drainage. Currently not being utilized as such. 2. TPN 3. Recheck CBC today, will likely need one more unit of prbcs. 4. Keep outpatient appt for 1/8th for consideration of dual liver/small bowel transplant.   Leanna BattlesLeslie S. Dixon BoosLewis, PA-C [facility] 12/31/201510:21 AM   LOS: 4 days     12/13/2014, 8:13 AM  Attending note:  No evidence of overt GI bleeding. Follow-up hemoglobin 8.4; additional transfusion on hold. Passive gravity venting of stomach via G-tube has resulted in much better decompression this afternoon. Patient more comfortable.  Barring any unforeseen events, would anticipate discharge tomorrow on TPN. Patient will be [facility]. Outpatient follow-up with Dr. Karilyn Cotaehman as well.

## 2014-12-13 NOTE — Progress Notes (Signed)
PARENTERAL NUTRITION CONSULT NOTE - follow up  Pharmacy Consult for TPN Indication: intolerance to oral feeding  Allergies  Allergen Reactions  . Remicade [Infliximab]     Dr Karilyn Cotaehman states previous respiratory arrest not related to remicade. Dr Karilyn Cotaehman states remicade not a drug related allergy. 07-07-2013 at 1025 rapid response called to PACU- Patient difficulty breathing after infusion of Remicade infusing. Do NOT Give Remicade!  . Fentanyl And Related Itching    Swelling, Redness  . Alfentanil Itching    Swelling, Redness  . Wellbutrin [Bupropion] Nausea And Vomiting  . Morphine And Related Hives   Patient Measurements: Height: 5\' 11"  (180.3 cm) Weight: 173 lb 6.4 oz (78.654 kg) IBW/kg (Calculated) : 75.3 Adjusted Body Weight: n/a  Vital Signs: Temp: 98.9 F (37.2 C) (12/31 0616) Temp Source: Oral (12/31 0616) BP: 107/60 mmHg (12/31 0616) Pulse Rate: 85 (12/31 0616) Intake/Output from previous day: 12/30 0701 - 12/31 0700 In: 1800 [P.O.:360; TPN:1440] Out: 2295 [Urine:2295] Intake/Output from this shift: Total I/O In: 10 [I.V.:10] Out: 250 [Urine:250]  Labs:  Recent Labs  12/10/14 1130 12/11/14 0450 12/12/14 0315  WBC 8.7 4.6 4.8  HGB 7.5* 6.4* 7.7*  HCT 23.1* 19.6* 23.8*  PLT 105* 80* 70*  INR 1.53* 1.33  --      Recent Labs  12/11/14 0450 12/12/14 0315 12/12/14 1110 12/12/14 1354 12/12/14 1444 12/13/14 0440  NA 126* 123* 130* 128*  --  129*  K 3.8 3.8 4.3 4.8  --  3.2*  CL 94* 93* 98 97  --  97  CO2 26 26 26 26   --  27  GLUCOSE 139* 129* 155* 132*  --  119*  BUN 44* 29* 25* 24*  --  21  CREATININE 0.97 0.76 0.78 0.72  --  0.61  LABCREA  --   --   --   --  79.41  --   CALCIUM 7.3* 7.1* 7.6* 7.5*  --  7.4*  MG 2.1 2.0  --   --   --  1.7  PHOS 3.0 2.3  --   --   --  1.9*  PROT 4.0* 4.2*  --   --   --  4.6*  ALBUMIN 2.0* 2.2*  --   --   --  2.3*  AST 25 28  --   --   --  30  ALT 20 24  --   --   --  29  ALKPHOS 70 80  --   --   --  91   BILITOT 0.7 1.3*  --   --   --  0.7  PREALBUMIN 9.9*  --   --   --   --   --   TRIG 87  --   --   --   --   --    Estimated Creatinine Clearance: 133.3 mL/min (by C-G formula based on Cr of 0.61).    Recent Labs  12/12/14 1643 12/13/14 0042 12/13/14 0508  GLUCAP 163* 141* 126*   Medical History: Past Medical History  Diagnosis Date  . 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 Hepatomegaly 05/04/2011  . Fatty liver 05/04/2011    "stage III fatty liver fibrosis" (11/15/2012)  . GERD (gastroesophageal reflux disease)   . Chronic liver disease     /notes 11/15/2012  . Pericarditis     [name]/notes 11/15/2012  . Hypertension   . Pneumonia 1977  .  Shortness of breath     "all the time right now" (11/15/2012)  . History of blood transfusion 2004  . History of stomach ulcers   . Duodenal ulcer   . Depression   . Kidney stones     bilaterally/notes 11/15/2012  . Hepatic fibrosis     [name] 11/15/2012  . Anemia   . Pericardial effusion 10/29/2012    moderate to large/notes 11/15/2012  . Anxiety   . Hepatitis   . Crohn's disease   . ED (erectile dysfunction)   . Cirrhosis     secondary to drug effect, +/- fatty liver   Insulin Requirements in the past 24 hours:  ~ 5 units  Current Nutrition:  Initiating TPN  Assessment: Brief narrative: 38 y/o ? 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  Was sent home to be with family over [place]. Over that period of time he progressively got weaker and weaker and felt more ill or nauseous and was unable to keep  down much by mouth.  His last meal was yesterday and was grits in the morning and he only had 4-5 bites. Of note he has been taking relatively large doses of Roxanol 20 mg 4 times a day which has helped his pain.  Na, K, and PHos low > will replenish.  D/W Dr Mahala Menghini Triglycerides OK.  Nutritional Goals:  2000-2200 kCal, 100-115 grams of protein per day  Plan:   Continue Clinimix E 5/20 at 49ml/hr  Provide lipids, trace elements, and MVI with TPN  Replenish Na, K, and PHos with KCL runs and K-PHos   3% NaCl at 23ml/hr x 6 hrs today  Monitor lytes, triglycerides, glucose tolerance, renal fxn, and fluid status  Transition to tube feedings when improved / feasible  F/U lytes daily for now  Valrie Hart A 12/13/2014,10:27 AM

## 2014-12-13 NOTE — Progress Notes (Signed)
David [name]  MRN: [id number]  DOB/AGE: Oct 26, 1976 38 y.o.  Primary Care Physician:LUKING,W S, MD  Admit date: 12/09/2014  Chief Complaint:  Chief Complaint  Patient presents with  . Hypotension    [name]-Pt presented on  12/09/2014 with  Chief Complaint  Patient presents with  . Hypotension  .    Pt today feels better   . feeding supplement (ENSURE COMPLETE)  237 mL Oral BID BM  . ferumoxytol  510 mg Intravenous Once  . furosemide  40 mg Oral Daily  . insulin aspart  0-9 Units Subcutaneous 4 times per day  . levofloxacin  500 mg Oral Daily  . oxyCODONE  20 mg Oral QID  . pantoprazole  40 mg Oral BID  . potassium phosphate IVPB (mmol)  10 mmol Intravenous Once  . predniSONE  30 mg Oral Daily  . sodium chloride  10-40 mL Intracatheter Q12H  . sodium chloride  1 g Oral BID WC  . sucralfate  1 g Oral TID WC      Physical Exam: Vital signs in last 24 hours: Temp:  [98.2 F (36.8 C)-98.9 F (37.2 C)] 98.8 F (37.1 C) (12/31 1410) Pulse Rate:  [85-95] 95 (12/31 1410) Resp:  [18-20] 20 (12/31 1410) BP: (94-107)/(59-63) 99/59 mmHg (12/31 1410) SpO2:  [100 %] 100 % (12/31 1410) Weight:  [174 lb (78.926 kg)] 174 lb (78.926 kg) (12/31 1046) Weight change:  Last BM Date: 12/12/14  Intake/Output from previous day: 12/30 0701 - 12/31 0700 In: 1800 [P.O.:360; TPN:1440] Out: 2295 [Urine:2295] Total I/O In: 10 [I.V.:10] Out: 1800 [Urine:750; Drains:1050]   Physical Exam: General- pt is awake,alert, oriented to time place and person Resp- No acute REsp distress, CTA B/L NO Rhonchi CVS- S1S2 regular in rate and rhythm GIT- Peg tube in situ, BS +,Distended EXT- 1+ LE Edema, NO Cyanosis   Lab Results: CBC  Recent Labs  12/12/14 0315 12/13/14 1027  WBC 4.8 7.2  HGB 7.7* 8.4*  HCT 23.8* 25.9*  PLT 70* 88*    BMET  Recent Labs  12/12/14 1354 12/13/14 0440  NA 128* 129*  K 4.8 3.2*  CL 97 97  CO2 26 27  GLUCOSE 132* 119*  BUN 24* 21  CREATININE 0.72  0.61  CALCIUM 7.5* 7.4*   Trend  BUN 79=>29  12/27 12/28 12/29     12/30                  12/31  Sodium 121=> 127=> 126=> 123=>130=>128= > 129   Creat 1.62=>0.78    MICRO Recent Results (from the past 240 hour([name]))  Blood culture (routine x 2)     Status: None (Preliminary result)   Collection Time: 12/09/14  5:36 PM  Result Value Ref Range Status   Specimen Description BLOOD RIGHT ANTECUBITAL  Final   Special Requests BOTTLES DRAWN AEROBIC AND ANAEROBIC 10CC  Final   Culture NO GROWTH 4 DAYS  Final   Report Status PENDING  Incomplete  Blood culture (routine x 2)     Status: None (Preliminary result)   Collection Time: 12/09/14  5:45 PM  Result Value Ref Range Status   Specimen Description BLOOD LEFT HAND  Final   Special Requests BOTTLES DRAWN AEROBIC ONLY 5CC  Final   Culture NO GROWTH 4 DAYS  Final   Report Status PENDING  Incomplete      Lab Results  Component Value Date   CALCIUM 7.4* 12/13/2014   PHOS 1.9* 12/13/2014  Urine na  Less than 10   Iron saturation 5%      Impression: 1)Renal AKI secondary to Prerenal.  AKI now better     2)Hypotension-Bp on lower side but better than before. 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    Pt was transferred top [place] yesterday for G tube exchange.  Not much IVF because of of this.                 Urine Na less than 10                         sec to Cirrhosis?   Hypervolemia   Is 6+ liters Positive   Edema +     5)GI- Hx of Severe Chron'[name] ds  Gi and Primary MD following  6)Acid base Co2 at goal   7) Hypocalcemia  Corrected calcium within goal.  8) ID CXR Positive for Infiltrates  initiated on ABX  9) Hypokalemia- from Diuretics/High aldosterone state from Liver issues     Being repleted   Plan:  Will suggest to add spironolactone ( once BP tolerates) as Urine sodium low probably from liver issues. Will folow BMET      David [name] 12/13/2014, 3:17 PM

## 2014-12-14 DIAGNOSIS — Z4801 Encounter for change or removal of surgical wound dressing: Secondary | ICD-10-CM | POA: Diagnosis not present

## 2014-12-14 DIAGNOSIS — I10 Essential (primary) hypertension: Secondary | ICD-10-CM | POA: Diagnosis not present

## 2014-12-14 DIAGNOSIS — Z452 Encounter for adjustment and management of vascular access device: Secondary | ICD-10-CM | POA: Diagnosis not present

## 2014-12-14 DIAGNOSIS — K760 Fatty (change of) liver, not elsewhere classified: Secondary | ICD-10-CM | POA: Diagnosis not present

## 2014-12-14 DIAGNOSIS — K3184 Gastroparesis: Secondary | ICD-10-CM | POA: Diagnosis not present

## 2014-12-14 DIAGNOSIS — L89151 Pressure ulcer of sacral region, stage 1: Secondary | ICD-10-CM | POA: Diagnosis not present

## 2014-12-14 DIAGNOSIS — K769 Liver disease, unspecified: Secondary | ICD-10-CM | POA: Diagnosis not present

## 2014-12-14 DIAGNOSIS — K603 Anal fistula: Secondary | ICD-10-CM | POA: Diagnosis not present

## 2014-12-14 DIAGNOSIS — K269 Duodenal ulcer, unspecified as acute or chronic, without hemorrhage or perforation: Secondary | ICD-10-CM | POA: Diagnosis not present

## 2014-12-14 DIAGNOSIS — K5090 Crohn's disease, unspecified, without complications: Secondary | ICD-10-CM | POA: Diagnosis not present

## 2014-12-14 DIAGNOSIS — K50813 Crohn's disease of both small and large intestine with fistula: Secondary | ICD-10-CM | POA: Diagnosis not present

## 2014-12-14 DIAGNOSIS — K739 Chronic hepatitis, unspecified: Secondary | ICD-10-CM | POA: Diagnosis not present

## 2014-12-14 DIAGNOSIS — K7689 Other specified diseases of liver: Secondary | ICD-10-CM | POA: Diagnosis not present

## 2014-12-14 DIAGNOSIS — K219 Gastro-esophageal reflux disease without esophagitis: Secondary | ICD-10-CM | POA: Diagnosis not present

## 2014-12-14 DIAGNOSIS — K7460 Unspecified cirrhosis of liver: Secondary | ICD-10-CM | POA: Diagnosis not present

## 2014-12-14 DIAGNOSIS — Z5181 Encounter for therapeutic drug level monitoring: Secondary | ICD-10-CM | POA: Diagnosis not present

## 2014-12-14 LAB — COMPREHENSIVE METABOLIC PANEL
ALT: 31 U/L (ref 0–53)
ANION GAP: 4 — AB (ref 5–15)
AST: 29 U/L (ref 0–37)
Albumin: 2.2 g/dL — ABNORMAL LOW (ref 3.5–5.2)
Alkaline Phosphatase: 92 U/L (ref 39–117)
BILIRUBIN TOTAL: 0.8 mg/dL (ref 0.3–1.2)
BUN: 17 mg/dL (ref 6–23)
CALCIUM: 7.4 mg/dL — AB (ref 8.4–10.5)
CO2: 25 mmol/L (ref 19–32)
Chloride: 103 mEq/L (ref 96–112)
Creatinine, Ser: 0.6 mg/dL (ref 0.50–1.35)
GFR calc Af Amer: >90 mL/min (ref >90)
Glucose, Bld: 132 mg/dL — ABNORMAL HIGH (ref 70–99)
Potassium: 3.1 mmol/L — ABNORMAL LOW (ref 3.5–5.1)
SODIUM: 132 mmol/L — AB (ref 135–145)
TOTAL PROTEIN: 4.5 g/dL — AB (ref 6.0–8.3)

## 2014-12-14 LAB — CBC WITH DIFFERENTIAL/PLATELET
BASOS ABS: 0 10*3/uL (ref 0.0–0.1)
BASOS PCT: 0 % (ref 0–1)
Eosinophils Absolute: 0.1 10*3/uL (ref 0.0–0.7)
Eosinophils Relative: 1 % (ref 0–5)
HEMATOCRIT: 25 % — AB (ref 39.0–52.0)
HEMOGLOBIN: 8 g/dL — AB (ref 13.0–17.0)
LYMPHS ABS: 1 10*3/uL (ref 0.7–4.0)
Lymphocytes Relative: 12 % (ref 12–46)
MCH: 26.1 pg (ref 26.0–34.0)
MCHC: 32 g/dL (ref 30.0–36.0)
MCV: 81.4 fL (ref 78.0–100.0)
Monocytes Absolute: 0.7 10*3/uL (ref 0.1–1.0)
Monocytes Relative: 8 % (ref 3–12)
Neutro Abs: 6.3 10*3/uL (ref 1.7–7.7)
Neutrophils Relative %: 79 % — ABNORMAL HIGH (ref 43–77)
Platelets: 69 10*3/uL — ABNORMAL LOW (ref 150–400)
RBC: 3.07 MIL/uL — AB (ref 4.22–5.81)
RDW: 17 % — ABNORMAL HIGH (ref 11.5–15.5)
WBC: 8.1 10*3/uL (ref 4.0–10.5)

## 2014-12-14 LAB — GLUCOSE, CAPILLARY
Glucose-Capillary: 124 mg/dL — ABNORMAL HIGH (ref 70–99)
Glucose-Capillary: 191 mg/dL — ABNORMAL HIGH (ref 70–99)
Glucose-Capillary: 140 mg/dL — ABNORMAL HIGH (ref 70–99)

## 2014-12-14 LAB — PHOSPHORUS: PHOSPHORUS: 2.2 mg/dL — AB (ref 2.3–4.6)

## 2014-12-14 MED ORDER — POTASSIUM CHLORIDE 20 MEQ/15ML (10%) PO SOLN: 40.0000 meq | Freq: Every day | ORAL | Status: DC

## 2014-12-14 MED ORDER — PROMETHAZINE HCL 25 MG/ML IJ SOLN: 25.0000 mg | Freq: Four times a day (QID) | INTRAMUSCULAR | Status: DC | PRN

## 2014-12-14 MED ORDER — FAT EMULSION 20 % IV EMUL
250.0000 mL | INTRAVENOUS | Status: DC
  Filled 2014-12-14: qty 250

## 2014-12-14 MED ORDER — SODIUM CHLORIDE 3 % IV SOLN
INTRAVENOUS | Status: DC
  Filled 2014-12-14: qty 500

## 2014-12-14 MED ORDER — FUROSEMIDE 40 MG PO TABS: 40.0000 mg | ORAL_TABLET | Freq: Every day | ORAL | Status: DC

## 2014-12-14 MED ORDER — POTASSIUM CHLORIDE 10 MEQ/100ML IV SOLN
10.0000 meq | INTRAVENOUS | Status: AC
  Administered 2014-12-14 (×2): 10 meq via INTRAVENOUS
  Filled 2014-12-14: qty 100

## 2014-12-14 MED ORDER — TRACE MINERALS CR-CU-F-FE-I-MN-MO-SE-ZN IV SOLN
INTRAVENOUS | Status: DC
  Filled 2014-12-14: qty 2000

## 2014-12-14 MED ORDER — SODIUM PHOSPHATE 3 MMOLE/ML IV SOLN
15.0000 mmol | Freq: Once | INTRAVENOUS | Status: AC
  Administered 2014-12-14: 15 mmol via INTRAVENOUS
  Filled 2014-12-14: qty 5

## 2014-12-14 MED ORDER — POTASSIUM CHLORIDE 20 MEQ/15ML (10%) PO SOLN: 20.0000 meq | Freq: Once | ORAL | Status: DC

## 2014-12-14 MED ORDER — OXYCODONE HCL 20 MG/ML PO CONC: 20.0000 mg | Freq: Four times a day (QID) | ORAL | Status: DC

## 2014-12-14 MED ORDER — POTASSIUM CHLORIDE 20 MEQ PO PACK
40.0000 meq | PACK | Freq: Every day | ORAL | Status: DC
  Filled 2014-12-14 (×3): qty 2

## 2014-12-14 MED ORDER — LEVOFLOXACIN 500 MG PO TABS: 500.0000 mg | ORAL_TABLET | Freq: Every day | ORAL | Status: DC

## 2014-12-14 NOTE — Progress Notes (Signed)
Discussed at length with Dr. Mahala Menghini this morning.  Patient states abdominal distention overall much improved with larger G-tube. Patient without nausea, vomiting, melena or hematochezia.  TPN ongoing. Vital signs in last 24 hours: Temp:  [97.9 F (36.6 C)-99.5 F (37.5 C)] 98.4 F (36.9 C) (01/01 0659) Pulse Rate:  [91-95] 95 (01/01 0659) Resp:  [16-20] 16 (01/01 0659) BP: (99-108)/(59-64) 105/60 mmHg (01/01 0659) SpO2:  [97 %-100 %] 97 % (01/01 0659) Last BM Date: 12/13/14 General:   Wasted, cachectic; pleasant and cooperative in NAD Abdomen:  Distended. G-tube looks good. Infrequent bowel sounds.  Only mild diffuse tenderness to palpation. organomegaly. Extremities:  Without clubbing or edema.    Intake/Output from previous day: 12/31 0701 - 01/01 0700 In: 1053.3 [I.V.:93.3; TPN:960] Out: 4950 [Urine:1350; Drains:2450] Intake/Output this shift: Total I/O In: 180 [P.O.:180] Out: -   Lab Results:  Recent Labs  12/12/14 0315 12/13/14 1027 12/14/14 0551  WBC 4.8 7.2 8.1  HGB 7.7* 8.4* 8.0*  HCT 23.8* 25.9* 25.0*  PLT 70* 88* 69*   BMET  Recent Labs  12/12/14 1354 12/13/14 0440 12/14/14 0551  NA 128* 129* 132*  K 4.8 3.2* 3.1*  CL 97 97 103  CO2 26 27 25   GLUCOSE 132* 119* 132*  BUN 24* 21 17  CREATININE 0.72 0.61 0.60  CALCIUM 7.5* 7.4* 7.4*   LFT  Recent Labs  12/14/14 0551  PROT 4.5*  ALBUMIN 2.2*  AST 29  ALT 31  ALKPHOS 92  BILITOT 0.8   Impression:  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 Patient remained stable on TPN. He continues to be hyponatremic and hypokalemic Tolerating TPN.  Hemoglobin 8;  He has received 4 units units of packed red blood cells this admission. He has received parenteral iron for marked iron deficiency anemia.  Recommendations:  As discussed with Dr. Mahala Menghini, I believe he can discharged home today on TPN with close home health follow-up. TPN formulation needs to  be adjusted to combat hyponatremia and hypokalemia per pharmacy. Close interval follow-up labs as discussed. Continue to use G-tube for venting purposes only.  He should see Dr. Karilyn Cota face-to-face next week. He should keep his appointment data D: January 8.

## 2014-12-14 NOTE — Progress Notes (Signed)
David [name]  MRN: [id number]  DOB/AGE: [birth year] 39 y.o.  Primary Care Physician:LUKING,W S, MD  Admit date: 12/09/2014  Chief Complaint:  Chief Complaint  Patient presents with  . Hypotension    S-Pt presented on  12/09/2014 with  Chief Complaint  Patient presents with  . Hypotension  .    Pt today feels better  Meds . feeding supplement (ENSURE COMPLETE)  237 mL Oral BID BM  . [START ON 12/15/2014] furosemide  40 mg Oral Daily   And  . [START ON 12/15/2014] potassium chloride  40 mEq Oral Daily  . insulin aspart  0-9 Units Subcutaneous 4 times per day  . levofloxacin  500 mg Oral Daily  . oxyCODONE  20 mg Oral QID  . pantoprazole  40 mg Oral BID  . potassium chloride  10 mEq Intravenous Q1 Hr x 3  . potassium chloride  20 mEq Oral Once  . predniSONE  30 mg Oral Daily  . sodium chloride  10-40 mL Intracatheter Q12H  . sodium chloride  1 g Oral BID WC  . sucralfate  1 g Oral TID WC      Physical Exam: Vital signs in last 24 hours: Temp:  [97.9 F (36.6 C)-99.5 F (37.5 C)] 98.4 F (36.9 C) (01/01 0659) Pulse Rate:  [91-95] 95 (01/01 0659) Resp:  [16-20] 16 (01/01 0659) BP: (105-108)/(60-64) 105/60 mmHg (01/01 0659) SpO2:  [97 %-100 %] 97 % (01/01 0659) Weight change: 9.6 oz (0.272 kg) Last BM Date: 12/13/14  Intake/Output from previous day: 12/31 0701 - 01/01 0700 In: 1053.3 [I.V.:93.3; TPN:960] Out: 4950 [Urine:1350; Drains:2450] Total I/O In: 180 [P.O.:180] Out: -    Physical Exam: General- pt is awake,alert, oriented to time place and person Resp- No acute REsp distress, CTA B/L NO Rhonchi CVS- S1S2 regular in rate and rhythm GIT- Peg tube in situ, BS +,Distended EXT- 1+ LE Edema, NO Cyanosis   Lab Results: CBC  Recent Labs  12/13/14 1027 12/14/14 0551  WBC 7.2 8.1  HGB 8.4* 8.0*  HCT 25.9* 25.0*  PLT 88* 69*    BMET  Recent Labs  12/13/14 0440 12/14/14 0551  NA 129* 132*  K 3.2* 3.1*  CL 97 103  CO2 27 25  GLUCOSE 119*  132*  BUN 21 17  CREATININE 0.61 0.60  CALCIUM 7.4* 7.4*   Trend  BUN 79=>29     12/30                  12/31  12/14/14 Sodium 121=> 127=> 126=> 123=>130=>128= > 129     132   Creat 1.62=>0.60    MICRO Recent Results (from the past 240 hour(s))  Blood culture (routine x 2)     Status: None (Preliminary result)   Collection Time: 12/09/14  5:36 PM  Result Value Ref Range Status   Specimen Description BLOOD RIGHT ANTECUBITAL  Final   Special Requests BOTTLES DRAWN AEROBIC AND ANAEROBIC 10CC  Final   Culture NO GROWTH 4 DAYS  Final   Report Status PENDING  Incomplete  Blood culture (routine x 2)     Status: None (Preliminary result)   Collection Time: 12/09/14  5:45 PM  Result Value Ref Range Status   Specimen Description BLOOD LEFT HAND  Final   Special Requests BOTTLES DRAWN AEROBIC ONLY 5CC  Final   Culture NO GROWTH 4 DAYS  Final   Report Status PENDING  Incomplete      Lab Results  Component Value Date   CALCIUM 7.4* 12/14/2014   PHOS 2.2* 12/14/2014      Urine na  Less than 10   Iron saturation 5%      Impression: 1)Renal AKI secondary to Prerenal.  AKI now better     2)Hypotension-Bp on lower side but better than before. No need of pressors yet   3)Anemia HGb at goal (9--11) An of chronic ds( Chron's disease)  + Iron deficiency    4)Hyponatremia  Etiology  Hypovolemic Hyponatremia  - Pt responded to IVF  Sodium improved  Creat improved  BUN improved   Trending down now from past 2 days  Etiology  Hypovolemic still ?    Pt was transferred top [place] yesterday for G tube exchange.  Not much IVF because of of this.                  Urine Na less than 10                        sec to Cirrhosis?   Hypervolemia   Is 6+ liters Positive   Edema +     5)GI- Hx of Severe Chron's ds  Gi and Primary MD following  6)Acid base Co2 at goal   7) Hypocalcemia  Corrected calcium within goal.  8) ID CXR Positive for Infiltrates  initiated on ABX  9) Hypokalemia- from Diuretics/High aldosterone state from Liver issues     Being repleted   Plan:  Will continue current care.     Janaa Acero S 12/14/2014, 2:59 PM

## 2014-12-14 NOTE — Progress Notes (Signed)
Patient discharged with instructions, prescription, and care notes.  Verbalized understanding via teach back.   Patient voiced no further complaints or concerns at the time of discharge.  Appointments scheduled per instructions.  Patient left the floor via w/c with staff and family in stable condition.  Patients PICC was left in place flushed with 10cc NS prior to discharge.  Pt stated that the nurse had called him and was aware he was on the way home.

## 2014-12-14 NOTE — Discharge Summary (Signed)
Physician Discharge Summary  David [name]:[id number] DOB: 12-31-1975 DOA: 12/09/2014  PCP: [name], MD  Admit date: 12/09/2014 Discharge date: 12/14/2014  Time spent: 45 minutes  Recommendations for Outpatient Follow-up:  1. Will need basic metabolic panel as well as electrolytes done within the next 3-4 days 2. Continue Lasix daily 40 mg as well as replacement 40 mg potassium 3. Consider IV iron infusion as an outpatient. Patient likely has multimodal anemia from lack of intrinsic factor and also pure iron deficit 4. Recommend close follow-up at tertiary care center for cirrhosis meld score 9 5. Complete by mouth Levaquin 12/21/13 for chronic aspiration. 6. Patient represents moderate to high risk for rehospitalization 7. Patient will be discharged home on TPN infusion and will need to continue PICC line on discharge home 8. Venting gastrostomy tube was upgraded this admission to 20 French under interventional radiology care--will need to have this monitored at home   Discharge Diagnoses:  Active Problems:   Hyponatremia   Symptomatic anemia   Leukocytosis   Adult failure to thrive   Severe dehydration   Crohn's disease of both small and large intestine with complication   Crohn's disease   Protein-calorie malnutrition, severe   Pain   Other cirrhosis of liver   Discharge Condition:  Fair  Diet recommendation:  TPN, liquids  Filed Weights   12/09/14 2112 12/12/14 1300 12/13/14 1046  Weight: 68.42 kg (150 lb 13.4 oz) 78.654 kg (173 lb 6.4 oz) 78.926 kg (174 lb)    History of present illness: Brief narrative: 39 y/o ? 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 Over that period of time he progressively got weaker and weaker and felt more ill or nauseous and was unable to keep down much by mouth he His last meal was yesterday and was grits in the morning and he only had 4-5 bites. Of note he has been taking relatively large doses of Roxanol 20 mg 4 times a day which has helped his pain He was found initially on admission to have profound anemia hemoglobin 6.4-7.1 with his baseline on 12/22 being 9.9 and 11.4 and 07/13/14 this was thought to be an represent up your red cell aplasia/lack of blood count based on his iron studies on 12/30. IV iron was started on 12/31. He was started on TPN, his venting tube in his abdomen was exchanged to a 20 [place] gastrostomy under fluoroscopy on 12/29 Because of his significant uremia initially as well as hyponatremia, nephrology was consulted   Hospital Course:   Crohn's disease-[facility], has been seen at [facility] in the past-sees Dr. Karilyn Cota in [place] every month for maintenance.  Agree with TPN. Order placed for PICC line. Patient to keep venting G-tube in place until returns to [facility] not to be used for feeds. Continue prednisone 30 mg daily  Likely anemia of chronic disease and microcytosis from iron deficiency with superimposed acute component -transfused a total  of 4 units of blood12/28 +12/29. Note that patient also has cirrhosis and is malnourished which may be the etiology of this. iron level XVII, ferritin 18 therefore this is probably pure Iromn def.  I did start IV iron infusions which should be considered as an outpatient for this patient as he may not be able to absorb iron orally   Chronic aspiration PNA Chest x-ray 12/27 shows pneumonia. This is confirmed on CT  scan. This is chronic-has had this since February 2015 when he was aspirating with feeds. We will start Levaquin 500 every 24 empirically 10-14 d, ending 12/21/13  Htn-Hypotensive 2/2 to vol depletion ABLA we discontinued Coreg 6.25 on 12/30  Severe protein energy malnutrition with wasting and hyponatremia-high risk for refeeding syndrome. Prealbumin 9.9, triglycerides 87, magnesium 2.0, phosphorus 2.3--patient currently on TPN and we will replace trace elements such as salt and potassium via pharmacy . Appreciate input    Acute hyponatremia-probably secondary to poor solute intake with liberalized by mouth water-resolved c Salt tabs.  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 Marland Kitchen He will need to be managed holistically by gastroenterology. Given 12/29 Lasix 20 mg IV for aquaresis.  his hypotension prevents use of nonselective beta blocker for secondary variceal bleed prevention  Anasarca-feeling more bloated as well as "full"  Has shifting dullness 12/30.  See #5--Dose Lasix 40 mg po now And daily... His weights are not accurate during this hospital stay   .Gastroparesis?-Continue Reglan 5 mg 3 times a day,Probably not helped by chronic opiate meds  Reflux continue pantoprazole 40 twice a day , continue sucralfate 1 g 3 times a day  Consultants:  GI  nephrology  Procedures:  none  Antibiotics:  levaquin 500 daily  Discharge Exam: Filed Vitals:   12/14/14 0659  BP: 105/60  Pulse: 95  Temp: 98.4 F (36.9 C)  Resp: 16   Alert stronger feeling better and no other issues at present time.  Tolerating some diet no chest pain no nausea no vomiting  GEOMI NCAT CardiovascS1-S2 no murmur rub or gallop Respitory: Clinically clear   abdomen soft distended mild anasarca Pitting edema noted  Discharge Instructions   Discharge Instructions    Diet - low sodium heart healthy    Complete by:  As directed      Discharge instructions    Complete by:  As  directed   Need close follow-up as an outpatient with gastroenterology Dr. Mervyn Skeeters Rehman Refill of chronic Roxanol given to the patient Complete Levaquin on 12/21/14 for aspiration pneumonia If you gain more than 02-4023 hour period of time he will need to be evaluated by either Dr. Jackelyn Knife or the emergency room as you may need to be tapped Continue Lasix orally 40 milligrams daily along with calcium     Increase activity slowly    Complete by:  As directed           Current Discharge Medication List    START taking these medications   Details  levofloxacin (LEVAQUIN) 500 MG tablet Take 1 tablet (500 mg total) by mouth daily. Qty: 7 tablet, Refills: 0    potassium chloride 20 MEQ/15ML (10%) SOLN Take 30 mLs (40 mEq total) by mouth daily. Qty: 900 mL, Refills: 0      CONTINUE these medications which have CHANGED   Details  oxyCODONE (ROXICODONE INTENSOL) 20 MG/ML concentrated solution Take 1 mL (20 mg total) by mouth 4 (four) times daily. Qty: 30 mL, Refills: 0  CONTINUE these medications which have NOT CHANGED   Details  calcium-vitamin D (OSCAL 500/200 D-3) 500-200 MG-UNIT per tablet Take 1 tablet by mouth 2 (two) times daily.   Associated Diagnoses: Osteoporosis    carvedilol (COREG) 6.25 MG tablet Take 6.25 mg by mouth 2 (two) times daily.     dicyclomine (BENTYL) 10 MG capsule Take 1 capsule (10 mg total) by mouth 3 (three) times daily as needed (stomach cramps). Qty: 90 capsule, Refills: 5   Associated Diagnoses: Right lower quadrant abdominal pain    ferrous gluconate (FERGON) 324 MG tablet Take 324 mg by mouth 3 (three) times daily.    fish oil-omega-3 fatty acids 1000 MG capsule Take 1 g by mouth 3 (three) times daily.    furosemide (LASIX) 40 MG tablet Take 1 tablet (40 mg total) by mouth daily as needed. Qty: 30 tablet, Refills: 2   Associated Diagnoses: Bilateral edema of lower extremity    metoCLOPramide (REGLAN) 5 MG tablet Take 1 tablet (5 mg total) by mouth  3 (three) times daily as needed for nausea. Qty: 90 tablet, Refills: 0   Associated Diagnoses: Nausea and vomiting    pantoprazole (PROTONIX) 40 MG tablet Take 1 tablet (40 mg total) by mouth 2 (two) times daily. Qty: 60 tablet, Refills: 5   Associated Diagnoses: Gastroesophageal reflux disease without esophagitis    polyethylene glycol (MIRALAX / GLYCOLAX) packet Take 17 g by mouth daily as needed for mild constipation or moderate constipation.     predniSONE (DELTASONE) 10 MG tablet Take 30 mg by mouth daily.    Probiotic Product (ALIGN) 4 MG CAPS Take 4 mg by mouth daily.     promethazine (PHENERGAN) 25 MG tablet Take 1 tablet (25 mg total) by mouth 2 (two) times daily as needed. nausea Qty: 30 tablet, Refills: 1   Associated Diagnoses: Nausea and vomiting, vomiting of unspecified type    sucralfate (CARAFATE) 1 G tablet Take 1 g by mouth 3 (three) times daily before meals.      STOP taking these medications     metolazone (ZAROXOLYN) 5 MG tablet      Multiple Vitamin (MULTI-VITAMINS) TABS      Oxycodone HCl 10 MG TABS      potassium chloride (K-DUR,KLOR-CON) 10 MEQ tablet      flintstones complete (FLINTSTONES) 60 MG chewable tablet        Allergies  Allergen Reactions  . Remicade [Infliximab]     Dr Karilyn Cota states previous respiratory arrest not related to remicade. Dr Karilyn Cota states remicade not a drug related allergy. 07-07-2013 at 1025 rapid response called to PACU- Patient difficulty breathing after infusion of Remicade infusing. Do NOT Give Remicade!  . Fentanyl And Related Itching    Swelling, Redness  . Alfentanil Itching    Swelling, Redness  . Wellbutrin [Bupropion] Nausea And Vomiting  . Morphine And Related Hives      The results of significant diagnostics from this hospitalization (including imaging, microbiology, ancillary and laboratory) are listed below for reference.    Significant Diagnostic Studies: Ct Abdomen Pelvis Wo Contrast  12/09/2014    CLINICAL DATA:  Weakness for 4 days with low-grade fever, history of Crohn's disease, recent gastrostomy placement  EXAM: CT ABDOMEN AND PELVIS WITHOUT CONTRAST  TECHNIQUE: Multidetector CT imaging of the abdomen and pelvis was performed following the standard protocol without IV contrast.  COMPARISON:  03/07/2014, 10/15/2014  FINDINGS: Lung bases again demonstrate diffuse reticulonodular airspace disease particularly in the right lower lobe but  to a lesser degree in the left lower lobe stable from the recent MRI examination.  Gallbladder has been surgically removed. The liver is stable in appearance. The spleen is enlarged in size but stable from the prior MRI examination. Stomach is significantly distended and a gastrostomy catheter is noted in place. The adrenal glands and pancreas are within normal limits. The kidneys are well visualized bilaterally and reveal some cystic change on the right. Fullness of the collecting systems is noted bilaterally although no definitive obstructive changes are seen. No ureteral calculi are seen. Mild ascites is noted.  There is some generalized distention of the small bowel without definitive obstructive change. Changes are similar to that seen on the prior exam. Changes may be related to the patient's given clinical history of Crohn's disease. This appearance is stable from the prior exam bony structures are stable in appearance. The appendix is not visualized consistent with prior cholecystectomy.  IMPRESSION: Splenomegaly likely related to a degree of portal hypertension. This is stable from previous exams.  Gastrostomy catheter in place within a distended stomach.  Mild fullness of the collecting systems bilaterally although no true obstructive changes are seen.  Generalized prominence of the small bowel which is stable when compared with prior exams. This may be related to the patient's underlying Crohn's disease.  Mild ascites stable from the prior exam.   Electronically  Signed   By: Alcide Clever M.D.   On: 12/09/2014 17:47   Ir Replc Gastro/colonic Tube Percut W/fluoro  12/11/2014   CLINICAL DATA:  Crohn's disease with small bowel obstruction. Percutaneous gastrostomy tube placed 3 weeks ago for decompression. Recurrent occlusions of the 12 French catheter. Exchange and up sizing is requested.  EXAM: GASTROSTOMY CATHETER REPLACEMENT  FLUOROSCOPY TIME:  2 min 18 seconds  TECHNIQUE: The procedure, risks, benefits, and alternatives were explained to the patient. Questions regarding the procedure were encouraged and answered. The patient understands and consents to the procedure. A 5 French angiographic catheter was placed as orogastric tube. The upper abdomen was prepped with Betadine, draped in usual sterile fashion, and infiltrated locally with 1% lidocaine.  Intravenous Fentanyl and Versed were administered as conscious sedation during continuous cardiorespiratory monitoring by the radiology RN, with a total moderate sedation time of less than 30 minutes.  The external portion of the previously placed 12 French gastrostomy catheter was cut. The 6 French snare sheath could not be advanced beyond the tip of the pigtail catheter within the lumen of the stomach. An Amplatz guidewire was advanced and the catheter exchanged for a 12 French peel-away sheath. Through this, the snare device was advanced and used to snare a guidewire passed through the orogastric tube. This was withdrawn, and the snare attached to the 20 French pull-through gastrostomy tube, which was advanced antegrade, positioned with the internal bumper securing the anterior gastric wall to the anterior abdominal wall. Small contrast injection confirms appropriate positioning. The external bumper was applied and the catheter was flushed. No immediate complication.  IMPRESSION: 1. Technically successful gastrostomy exchange under fluoroscopy for a 20 French pull-through device, OK for routine use.   Electronically  Signed   By: Oley Balm M.D.   On: 12/11/2014 16:14   Dg Chest Portable 1 View  12/09/2014   CLINICAL DATA:  Decreased oral intake, generalized weakness  EXAM: PORTABLE CHEST - 1 VIEW  COMPARISON:  08/21/2014  FINDINGS: Cardiac shadow is stable. The left lung is clear. The right lung is well aerated but demonstrates some patchy  infiltrative changes in the right lung base which may be related aspiration pneumonia. Clinical correlation is recommended.  IMPRESSION: New right basilar infiltrative change. This may represent aspiration pneumonia. Clinical correlation is recommended.   Electronically Signed   By: Alcide Clever M.D.   On: 12/09/2014 14:58    Microbiology: Recent Results (from the past 240 hour(s))  Blood culture (routine x 2)     Status: None (Preliminary result)   Collection Time: 12/09/14  5:36 PM  Result Value Ref Range Status   Specimen Description BLOOD RIGHT ANTECUBITAL  Final   Special Requests BOTTLES DRAWN AEROBIC AND ANAEROBIC 10CC  Final   Culture NO GROWTH 4 DAYS  Final   Report Status PENDING  Incomplete  Blood culture (routine x 2)     Status: None (Preliminary result)   Collection Time: 12/09/14  5:45 PM  Result Value Ref Range Status   Specimen Description BLOOD LEFT HAND  Final   Special Requests BOTTLES DRAWN AEROBIC ONLY 5CC  Final   Culture NO GROWTH 4 DAYS  Final   Report Status PENDING  Incomplete     Labs: Basic Metabolic Panel:  Recent Labs Lab 12/11/14 0450 12/12/14 0315 12/12/14 1110 12/12/14 1354 12/13/14 0440 12/14/14 0551  NA 126* 123* 130* 128* 129* 132*  K 3.8 3.8 4.3 4.8 3.2* 3.1*  CL 94* 93* 98 97 97 103  CO2 26 26 26 26 27 25   GLUCOSE 139* 129* 155* 132* 119* 132*  BUN 44* 29* 25* 24* 21 17  CREATININE 0.97 0.76 0.78 0.72 0.61 0.60  CALCIUM 7.3* 7.1* 7.6* 7.5* 7.4* 7.4*  MG 2.1 2.0  --   --  1.7  --   PHOS 3.0 2.3  --   --  1.9* 2.2*   Liver Function Tests:  Recent Labs Lab 12/09/14 1342 12/11/14 0450 12/12/14 0315  12/13/14 0440 12/14/14 0551  AST 31 25 28 30 29   ALT 24 20 24 29 31   ALKPHOS 105 70 80 91 92  BILITOT 0.9 0.7 1.3* 0.7 0.8  PROT 5.0* 4.0* 4.2* 4.6* 4.5*  ALBUMIN 2.6* 2.0* 2.2* 2.3* 2.2*   No results for input(s): LIPASE, AMYLASE in the last 168 hours. No results for input(s): AMMONIA in the last 168 hours. CBC:  Recent Labs Lab 12/10/14 1130 12/11/14 0450 12/12/14 0315 12/13/14 1027 12/14/14 0551  WBC 8.7 4.6 4.8 7.2 8.1  NEUTROABS 7.6 3.3 3.6 5.5 6.3  HGB 7.5* 6.4* 7.7* 8.4* 8.0*  HCT 23.1* 19.6* 23.8* 25.9* 25.0*  MCV 78.3 78.4 80.4 81.2 81.4  PLT 105* 80* 70* 88* 69*   Cardiac Enzymes: No results for input(s): CKTOTAL, CKMB, CKMBINDEX, TROPONINI in the last 168 hours. BNP: BNP (last 3 results) No results for input(s): PROBNP in the last 8760 hours. CBG:  Recent Labs Lab 12/13/14 1154 12/13/14 1643 12/13/14 2337 12/14/14 0655 12/14/14 1124  GLUCAP 165* 208* 116* 124* 191*       Signed:  Any Mcneice, JAI-GURMUKH  [facility] 12/14/2014, 1:50 PM

## 2014-12-14 NOTE — Progress Notes (Signed)
PARENTERAL NUTRITION CONSULT NOTE - follow up  Pharmacy Consult for TPN Indication: intolerance to oral feeding  Allergies  Allergen Reactions  . Remicade [Infliximab]     Dr Karilyn Cota states previous respiratory arrest not related to remicade. Dr Karilyn Cota states remicade not a drug related allergy. 07-07-2013 at 1025 rapid response called to PACU- Patient difficulty breathing after infusion of Remicade infusing. Do NOT Give Remicade!  . Fentanyl And Related Itching    Swelling, Redness  . Alfentanil Itching    Swelling, Redness  . Wellbutrin [Bupropion] Nausea And Vomiting  . Morphine And Related Hives   Patient Measurements: Height:  (180.3 cm) Weight:  (no bed scale and stand up scale did not work) IBW/kg (Calculated) : 75.3 Adjusted Body Weight: n/a  Vital Signs: Temp: 98.4 F (36.9 C) (01/01 0659) Temp Source: Oral (01/01 0659) BP: 105/60 mmHg (01/01 0659) Pulse Rate: 95 (01/01 0659) Intake/Output from previous day: 12/31 0701 - 01/01 0700 In: 1053.3 [I.V.:93.3; TPN:960] Out: 4950 [Urine:1350; Drains:2450] Intake/Output from this shift: Total I/O In: 180 [P.O.:180] Out: -   Labs:  Recent Labs  12/12/14 0315 12/13/14 1027 12/14/14 0551  WBC 4.8 7.2 8.1  HGB 7.7* 8.4* 8.0*  HCT 23.8* 25.9* 25.0*  PLT 70* 88* 69*     Recent Labs  12/12/14 0315  12/12/14 1354 12/12/14 1444 12/13/14 0440 12/14/14 0551  NA 123*  < > 128*  --  129* 132*  K 3.8  < > 4.8  --  3.2* 3.1*  CL 93*  < > 97  --  97 103  CO2 26  < > 26  --  27 25  GLUCOSE 129*  < > 132*  --  119* 132*  BUN 29*  < > 24*  --  21 17  CREATININE 0.76  < > 0.72  --  0.61 0.60  LABCREA  --   --   --  79.41  --   --   CALCIUM 7.1*  < > 7.5*  --  7.4* 7.4*  MG 2.0  --   --   --  1.7  --   PHOS 2.3  --   --   --  1.9* 2.2*  PROT 4.2*  --   --   --  4.6* 4.5*  ALBUMIN 2.2*  --   --   --  2.3* 2.2*  AST 28  --   --   --  30 29  ALT 24  --   --   --  29 31  ALKPHOS 80  --   --   --  91 92  BILITOT  1.3*  --   --   --  0.7 0.8  < > = values in this interval not displayed. Estimated Creatinine Clearance: 133.3 mL/min (by C-G formula based on Cr of 0.6).    Recent Labs  12/13/14 1643 12/13/14 2337 12/14/14 0655  GLUCAP 208* 116* 124*   Medical History: Past Medical History  Diagnosis Date  . 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 Hepatomegaly 05/04/2011  . Fatty liver 05/04/2011    "stage III fatty liver fibrosis" (11/15/2012)  . GERD (gastroesophageal reflux disease)   . Chronic liver disease     /notes 11/15/2012  . Pericarditis     [name] 11/15/2012  . Hypertension   . Pneumonia 1977  . Shortness of breath     "all the time  right now" (11/15/2012)  . History of blood transfusion 2004  . History of stomach ulcers   . Duodenal ulcer   . Depression   . Kidney stones     bilaterally/notes 11/15/2012  . Hepatic fibrosis     [name] 11/15/2012  . Anemia   . Pericardial effusion 10/29/2012    moderate to large/notes 11/15/2012  . Anxiety   . Hepatitis   . Crohn's disease   . ED (erectile dysfunction)   . Cirrhosis     secondary to drug effect, +/- fatty liver   Insulin Requirements in the past 24 hours:  ~ 6 units  Current Nutrition:  Initiating TPN  Assessment: Brief narrative: 39 y/o ? 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  Was sent home to be with family over [place]. Over that period of time he progressively got weaker and weaker and felt more ill or nauseous and was unable to keep down much by mouth.  His last meal was  yesterday and was grits in the morning and he only had 4-5 bites. Of note he has been taking relatively large doses of Roxanol 20 mg 4 times a day which has helped his pain. I/O = -3896  Na, K, and PHos low > will replenish.  D/W Dr Mahala Menghini Triglycerides OK.  Nutritional Goals:  2000-2200 kCal, 100-115 grams of protein per day  Plan:   Continue Clinimix E 5/20 at 33ml/hr  Provide lipids, trace elements, and MVI with TPN  Replenish Na, K, and PHos with KCL runs and Na-PHos   3% NaCl at 84ml/hr x 8 hrs today  Monitor lytes, triglycerides, glucose tolerance, renal fxn, and fluid status  Transition to tube feedings when improved / feasible  F/U lytes daily for now  Valrie Hart A 12/14/2014,11:16 AM

## 2014-12-15 ENCOUNTER — Encounter (HOSPITAL_COMMUNITY): Payer: Self-pay

## 2014-12-15 ENCOUNTER — Emergency Department (HOSPITAL_COMMUNITY): Payer: Medicare Other

## 2014-12-15 ENCOUNTER — Telehealth: Payer: Self-pay | Admitting: Internal Medicine

## 2014-12-15 ENCOUNTER — Inpatient Hospital Stay (HOSPITAL_COMMUNITY): Payer: Medicare Other

## 2014-12-15 ENCOUNTER — Inpatient Hospital Stay (HOSPITAL_COMMUNITY)
Admission: EM | Admit: 2014-12-15 | Discharge: 2015-01-01 | DRG: 871 | Disposition: A | Discharge status: Home Health | Payer: Medicare Other | Attending: Internal Medicine | Admitting: Internal Medicine

## 2014-12-15 DIAGNOSIS — M810 Age-related osteoporosis without current pathological fracture: Secondary | ICD-10-CM | POA: Diagnosis present

## 2014-12-15 DIAGNOSIS — K269 Duodenal ulcer, unspecified as acute or chronic, without hemorrhage or perforation: Secondary | ICD-10-CM | POA: Diagnosis not present

## 2014-12-15 DIAGNOSIS — T17908A Unspecified foreign body in respiratory tract, part unspecified causing other injury, initial encounter: Secondary | ICD-10-CM | POA: Diagnosis present

## 2014-12-15 DIAGNOSIS — R109 Unspecified abdominal pain: Secondary | ICD-10-CM

## 2014-12-15 DIAGNOSIS — E8809 Other disorders of plasma-protein metabolism, not elsewhere classified: Secondary | ICD-10-CM | POA: Diagnosis present

## 2014-12-15 DIAGNOSIS — J984 Other disorders of lung: Secondary | ICD-10-CM | POA: Diagnosis not present

## 2014-12-15 DIAGNOSIS — A419 Sepsis, unspecified organism: Secondary | ICD-10-CM | POA: Diagnosis not present

## 2014-12-15 DIAGNOSIS — F17210 Nicotine dependence, cigarettes, uncomplicated: Secondary | ICD-10-CM | POA: Diagnosis not present

## 2014-12-15 DIAGNOSIS — B999 Unspecified infectious disease: Secondary | ICD-10-CM

## 2014-12-15 DIAGNOSIS — I129 Hypertensive chronic kidney disease with stage 1 through stage 4 chronic kidney disease, or unspecified chronic kidney disease: Secondary | ICD-10-CM | POA: Diagnosis not present

## 2014-12-15 DIAGNOSIS — K210 Gastro-esophageal reflux disease with esophagitis: Secondary | ICD-10-CM | POA: Diagnosis not present

## 2014-12-15 DIAGNOSIS — K766 Portal hypertension: Secondary | ICD-10-CM | POA: Diagnosis not present

## 2014-12-15 DIAGNOSIS — K7460 Unspecified cirrhosis of liver: Secondary | ICD-10-CM | POA: Diagnosis not present

## 2014-12-15 DIAGNOSIS — I8510 Secondary esophageal varices without bleeding: Secondary | ICD-10-CM | POA: Diagnosis not present

## 2014-12-15 DIAGNOSIS — Z7952 Long term (current) use of systemic steroids: Secondary | ICD-10-CM

## 2014-12-15 DIAGNOSIS — Z9049 Acquired absence of other specified parts of digestive tract: Secondary | ICD-10-CM | POA: Diagnosis present

## 2014-12-15 DIAGNOSIS — K50813 Crohn's disease of both small and large intestine with fistula: Secondary | ICD-10-CM | POA: Diagnosis not present

## 2014-12-15 DIAGNOSIS — R509 Fever, unspecified: Secondary | ICD-10-CM

## 2014-12-15 DIAGNOSIS — N132 Hydronephrosis with renal and ureteral calculous obstruction: Secondary | ICD-10-CM | POA: Diagnosis not present

## 2014-12-15 DIAGNOSIS — R0602 Shortness of breath: Secondary | ICD-10-CM | POA: Diagnosis present

## 2014-12-15 DIAGNOSIS — E871 Hypo-osmolality and hyponatremia: Secondary | ICD-10-CM | POA: Diagnosis not present

## 2014-12-15 DIAGNOSIS — F1120 Opioid dependence, uncomplicated: Secondary | ICD-10-CM | POA: Diagnosis present

## 2014-12-15 DIAGNOSIS — K9422 Gastrostomy infection: Secondary | ICD-10-CM | POA: Diagnosis present

## 2014-12-15 DIAGNOSIS — K3184 Gastroparesis: Secondary | ICD-10-CM | POA: Diagnosis present

## 2014-12-15 DIAGNOSIS — Y833 Surgical operation with formation of external stoma as the cause of abnormal reaction of the patient, or of later complication, without mention of misadventure at the time of the procedure: Secondary | ICD-10-CM | POA: Diagnosis present

## 2014-12-15 DIAGNOSIS — R188 Other ascites: Secondary | ICD-10-CM | POA: Diagnosis present

## 2014-12-15 DIAGNOSIS — D6959 Other secondary thrombocytopenia: Secondary | ICD-10-CM | POA: Diagnosis not present

## 2014-12-15 DIAGNOSIS — J690 Pneumonitis due to inhalation of food and vomit: Secondary | ICD-10-CM | POA: Diagnosis not present

## 2014-12-15 DIAGNOSIS — N4889 Other specified disorders of penis: Secondary | ICD-10-CM | POA: Diagnosis present

## 2014-12-15 DIAGNOSIS — Z5181 Encounter for therapeutic drug level monitoring: Secondary | ICD-10-CM | POA: Diagnosis not present

## 2014-12-15 DIAGNOSIS — K745 Biliary cirrhosis, unspecified: Secondary | ICD-10-CM

## 2014-12-15 DIAGNOSIS — R1084 Generalized abdominal pain: Secondary | ICD-10-CM | POA: Diagnosis not present

## 2014-12-15 DIAGNOSIS — T17998A Other foreign object in respiratory tract, part unspecified causing other injury, initial encounter: Secondary | ICD-10-CM

## 2014-12-15 DIAGNOSIS — G4700 Insomnia, unspecified: Secondary | ICD-10-CM | POA: Diagnosis present

## 2014-12-15 DIAGNOSIS — Z931 Gastrostomy status: Secondary | ICD-10-CM | POA: Diagnosis not present

## 2014-12-15 DIAGNOSIS — R1031 Right lower quadrant pain: Secondary | ICD-10-CM | POA: Diagnosis not present

## 2014-12-15 DIAGNOSIS — R079 Chest pain, unspecified: Secondary | ICD-10-CM | POA: Diagnosis not present

## 2014-12-15 DIAGNOSIS — K5660 Unspecified intestinal obstruction: Secondary | ICD-10-CM | POA: Diagnosis not present

## 2014-12-15 DIAGNOSIS — I10 Essential (primary) hypertension: Secondary | ICD-10-CM | POA: Diagnosis not present

## 2014-12-15 DIAGNOSIS — K219 Gastro-esophageal reflux disease without esophagitis: Secondary | ICD-10-CM | POA: Diagnosis not present

## 2014-12-15 DIAGNOSIS — D509 Iron deficiency anemia, unspecified: Secondary | ICD-10-CM | POA: Diagnosis not present

## 2014-12-15 DIAGNOSIS — Z4801 Encounter for change or removal of surgical wound dressing: Secondary | ICD-10-CM | POA: Diagnosis not present

## 2014-12-15 DIAGNOSIS — D638 Anemia in other chronic diseases classified elsewhere: Secondary | ICD-10-CM | POA: Diagnosis present

## 2014-12-15 DIAGNOSIS — R601 Generalized edema: Secondary | ICD-10-CM | POA: Diagnosis not present

## 2014-12-15 DIAGNOSIS — L89151 Pressure ulcer of sacral region, stage 1: Secondary | ICD-10-CM | POA: Diagnosis not present

## 2014-12-15 DIAGNOSIS — K603 Anal fistula: Secondary | ICD-10-CM | POA: Diagnosis not present

## 2014-12-15 DIAGNOSIS — K769 Liver disease, unspecified: Secondary | ICD-10-CM | POA: Diagnosis not present

## 2014-12-15 DIAGNOSIS — K56609 Unspecified intestinal obstruction, unspecified as to partial versus complete obstruction: Secondary | ICD-10-CM

## 2014-12-15 DIAGNOSIS — R918 Other nonspecific abnormal finding of lung field: Secondary | ICD-10-CM | POA: Diagnosis not present

## 2014-12-15 DIAGNOSIS — N508 Other specified disorders of male genital organs: Secondary | ICD-10-CM | POA: Diagnosis present

## 2014-12-15 DIAGNOSIS — K632 Fistula of intestine: Secondary | ICD-10-CM | POA: Diagnosis not present

## 2014-12-15 DIAGNOSIS — K50812 Crohn's disease of both small and large intestine with intestinal obstruction: Secondary | ICD-10-CM | POA: Diagnosis not present

## 2014-12-15 DIAGNOSIS — K739 Chronic hepatitis, unspecified: Secondary | ICD-10-CM | POA: Diagnosis not present

## 2014-12-15 DIAGNOSIS — G894 Chronic pain syndrome: Secondary | ICD-10-CM | POA: Diagnosis present

## 2014-12-15 DIAGNOSIS — Z6826 Body mass index (BMI) 26.0-26.9, adult: Secondary | ICD-10-CM

## 2014-12-15 DIAGNOSIS — K760 Fatty (change of) liver, not elsewhere classified: Secondary | ICD-10-CM | POA: Diagnosis present

## 2014-12-15 DIAGNOSIS — Z452 Encounter for adjustment and management of vascular access device: Secondary | ICD-10-CM | POA: Diagnosis not present

## 2014-12-15 DIAGNOSIS — E46 Unspecified protein-calorie malnutrition: Secondary | ICD-10-CM | POA: Diagnosis not present

## 2014-12-15 DIAGNOSIS — R600 Localized edema: Secondary | ICD-10-CM | POA: Diagnosis not present

## 2014-12-15 DIAGNOSIS — Z0389 Encounter for observation for other suspected diseases and conditions ruled out: Secondary | ICD-10-CM | POA: Diagnosis not present

## 2014-12-15 DIAGNOSIS — N189 Chronic kidney disease, unspecified: Secondary | ICD-10-CM | POA: Diagnosis present

## 2014-12-15 DIAGNOSIS — J9811 Atelectasis: Secondary | ICD-10-CM | POA: Diagnosis not present

## 2014-12-15 DIAGNOSIS — E876 Hypokalemia: Secondary | ICD-10-CM | POA: Diagnosis present

## 2014-12-15 DIAGNOSIS — Z833 Family history of diabetes mellitus: Secondary | ICD-10-CM | POA: Diagnosis not present

## 2014-12-15 DIAGNOSIS — N289 Disorder of kidney and ureter, unspecified: Secondary | ICD-10-CM | POA: Diagnosis not present

## 2014-12-15 DIAGNOSIS — L03311 Cellulitis of abdominal wall: Secondary | ICD-10-CM | POA: Diagnosis not present

## 2014-12-15 DIAGNOSIS — K7469 Other cirrhosis of liver: Secondary | ICD-10-CM | POA: Diagnosis not present

## 2014-12-15 DIAGNOSIS — G8929 Other chronic pain: Secondary | ICD-10-CM | POA: Diagnosis not present

## 2014-12-15 DIAGNOSIS — K7689 Other specified diseases of liver: Secondary | ICD-10-CM | POA: Diagnosis not present

## 2014-12-15 DIAGNOSIS — K5090 Crohn's disease, unspecified, without complications: Secondary | ICD-10-CM | POA: Diagnosis not present

## 2014-12-15 DIAGNOSIS — L03818 Cellulitis of other sites: Secondary | ICD-10-CM | POA: Diagnosis not present

## 2014-12-15 LAB — CBC WITH DIFFERENTIAL/PLATELET
BASOS ABS: 0 10*3/uL (ref 0.0–0.1)
Basophils Relative: 0 % (ref 0–1)
EOS ABS: 0 10*3/uL (ref 0.0–0.7)
Eosinophils Relative: 0 % (ref 0–5)
HEMATOCRIT: 27.1 % — AB (ref 39.0–52.0)
HEMOGLOBIN: 8.7 g/dL — AB (ref 13.0–17.0)
LYMPHS PCT: 5 % — AB (ref 12–46)
Lymphs Abs: 0.9 10*3/uL (ref 0.7–4.0)
MCH: 26.4 pg (ref 26.0–34.0)
MCHC: 32.1 g/dL (ref 30.0–36.0)
MCV: 82.4 fL (ref 78.0–100.0)
MONO ABS: 0.9 10*3/uL (ref 0.1–1.0)
MONOS PCT: 5 % (ref 3–12)
NEUTROS ABS: 16 10*3/uL — AB (ref 1.7–7.7)
Neutrophils Relative %: 90 % — ABNORMAL HIGH (ref 43–77)
PLATELETS: 86 10*3/uL — AB (ref 150–400)
RBC: 3.29 MIL/uL — AB (ref 4.22–5.81)
RDW: 18.1 % — AB (ref 11.5–15.5)
WBC: 17.8 10*3/uL — ABNORMAL HIGH (ref 4.0–10.5)

## 2014-12-15 LAB — CULTURE, BLOOD (ROUTINE X 2)
Culture: NO GROWTH
Culture: NO GROWTH

## 2014-12-15 LAB — COMPREHENSIVE METABOLIC PANEL
ALK PHOS: 103 U/L (ref 39–117)
ALT: 36 U/L (ref 0–53)
AST: 34 U/L (ref 0–37)
Albumin: 2.2 g/dL — ABNORMAL LOW (ref 3.5–5.2)
Anion gap: 6 (ref 5–15)
BUN: 20 mg/dL (ref 6–23)
CO2: 23 mmol/L (ref 19–32)
Calcium: 6.9 mg/dL — ABNORMAL LOW (ref 8.4–10.5)
Chloride: 102 mEq/L (ref 96–112)
Creatinine, Ser: 0.68 mg/dL (ref 0.50–1.35)
GFR calc non Af Amer: >90 mL/min (ref >90)
Glucose, Bld: 110 mg/dL — ABNORMAL HIGH (ref 70–99)
Potassium: 3.1 mmol/L — ABNORMAL LOW (ref 3.5–5.1)
SODIUM: 131 mmol/L — AB (ref 135–145)
Total Bilirubin: 0.8 mg/dL (ref 0.3–1.2)
Total Protein: 4.6 g/dL — ABNORMAL LOW (ref 6.0–8.3)

## 2014-12-15 LAB — BLOOD GAS, ARTERIAL
ACID-BASE EXCESS: 0.1 mmol/L (ref 0.0–2.0)
BICARBONATE: 23.4 meq/L (ref 20.0–24.0)
Drawn by: 38235
FIO2: 0.21 %
O2 SAT: 96.1 %
PATIENT TEMPERATURE: 37
PCO2 ART: 32.7 mmHg — AB (ref 35.0–45.0)
PO2 ART: 74.5 mmHg — AB (ref 80.0–100.0)
TCO2: 22 mmol/L (ref 0–100)
pH, Arterial: 7.469 — ABNORMAL HIGH (ref 7.350–7.450)

## 2014-12-15 LAB — URINALYSIS, ROUTINE W REFLEX MICROSCOPIC
BILIRUBIN URINE: NEGATIVE
Glucose, UA: NEGATIVE mg/dL
KETONES UR: NEGATIVE mg/dL
Leukocytes, UA: NEGATIVE
NITRITE: NEGATIVE
PROTEIN: NEGATIVE mg/dL
SPECIFIC GRAVITY, URINE: 1.015 (ref 1.005–1.030)
Urobilinogen, UA: 0.2 mg/dL (ref 0.0–1.0)
pH: 6 (ref 5.0–8.0)

## 2014-12-15 LAB — PROTIME-INR
INR: 1.44 (ref 0.00–1.49)
PROTHROMBIN TIME: 17.7 s — AB (ref 11.6–15.2)

## 2014-12-15 LAB — APTT: APTT: 30 s (ref 24–37)

## 2014-12-15 LAB — MRSA PCR SCREENING: MRSA by PCR: NEGATIVE

## 2014-12-15 LAB — I-STAT CG4 LACTIC ACID, ED: Lactic Acid, Venous: 1.03 mmol/L (ref 0.5–2.2)

## 2014-12-15 LAB — TROPONIN I

## 2014-12-15 LAB — URINE MICROSCOPIC-ADD ON

## 2014-12-15 LAB — PROCALCITONIN: Procalcitonin: 0.35 ng/mL

## 2014-12-15 MED ORDER — ONDANSETRON HCL 4 MG/2ML IJ SOLN
4.0000 mg | Freq: Once | INTRAMUSCULAR | Status: AC
  Administered 2014-12-15: 4 mg via INTRAVENOUS
  Filled 2014-12-15: qty 2

## 2014-12-15 MED ORDER — HYDROMORPHONE HCL 1 MG/ML IJ SOLN
0.5000 mg | Freq: Once | INTRAMUSCULAR | Status: AC
Start: 2014-12-15 — End: 2014-12-15
  Administered 2014-12-15: 0.5 mg via INTRAVENOUS
  Filled 2014-12-15: qty 1

## 2014-12-15 MED ORDER — VANCOMYCIN HCL IN DEXTROSE 750-5 MG/150ML-% IV SOLN
750.0000 mg | Freq: Two times a day (BID) | INTRAVENOUS | Status: DC
  Administered 2014-12-16: 750 mg via INTRAVENOUS
  Filled 2014-12-15 (×3): qty 150

## 2014-12-15 MED ORDER — HYDROCORTISONE NA SUCCINATE PF 100 MG IJ SOLR
100.0000 mg | Freq: Three times a day (TID) | INTRAMUSCULAR | Status: DC
  Administered 2014-12-15 – 2014-12-16 (×3): 100 mg via INTRAVENOUS
  Filled 2014-12-15 (×3): qty 2

## 2014-12-15 MED ORDER — HYDROMORPHONE HCL 1 MG/ML IJ SOLN
2.0000 mg | INTRAMUSCULAR | Status: DC | PRN
  Administered 2014-12-15 – 2014-12-21 (×56): 2 mg via INTRAVENOUS
  Filled 2014-12-15 (×59): qty 2

## 2014-12-15 MED ORDER — POTASSIUM CHLORIDE IN NACL 40-0.9 MEQ/L-% IV SOLN
INTRAVENOUS | Status: AC
  Administered 2014-12-15 – 2014-12-16 (×2): 75 mL/h via INTRAVENOUS

## 2014-12-15 MED ORDER — PIPERACILLIN-TAZOBACTAM 3.375 G IVPB 30 MIN
3.3750 g | Freq: Once | INTRAVENOUS | Status: AC
  Administered 2014-12-15: 3.375 g via INTRAVENOUS

## 2014-12-15 MED ORDER — SODIUM CHLORIDE 0.9 % IV BOLUS (SEPSIS)
1000.0000 mL | Freq: Once | INTRAVENOUS | Status: AC
  Administered 2014-12-15: 1000 mL via INTRAVENOUS

## 2014-12-15 MED ORDER — SUCRALFATE 1 G PO TABS
1.0000 g | ORAL_TABLET | Freq: Three times a day (TID) | ORAL | Status: DC
  Administered 2014-12-16 – 2014-12-19 (×12): 1 g via ORAL
  Filled 2014-12-15 (×14): qty 1

## 2014-12-15 MED ORDER — HYDROMORPHONE HCL 1 MG/ML IJ SOLN
0.5000 mg | INTRAMUSCULAR | Status: DC | PRN
  Filled 2014-12-15: qty 1

## 2014-12-15 MED ORDER — ALIGN 4 MG PO CAPS: 4.0000 mg | ORAL_CAPSULE | Freq: Every day | ORAL | Status: DC

## 2014-12-15 MED ORDER — PIPERACILLIN-TAZOBACTAM 3.375 G IVPB
INTRAVENOUS | Status: AC
  Filled 2014-12-15: qty 100

## 2014-12-15 MED ORDER — HYDROMORPHONE HCL 1 MG/ML IJ SOLN: 1.0000 mg | INTRAMUSCULAR | Status: DC | PRN

## 2014-12-15 MED ORDER — PANTOPRAZOLE SODIUM 40 MG IV SOLR
40.0000 mg | Freq: Two times a day (BID) | INTRAVENOUS | Status: DC
  Administered 2014-12-15 – 2014-12-22 (×14): 40 mg via INTRAVENOUS
  Filled 2014-12-15 (×14): qty 40

## 2014-12-15 MED ORDER — VANCOMYCIN HCL IN DEXTROSE 750-5 MG/150ML-% IV SOLN
INTRAVENOUS | Status: AC
  Filled 2014-12-15: qty 150

## 2014-12-15 MED ORDER — NA FERRIC GLUC CPLX IN SUCROSE 12.5 MG/ML IV SOLN
125.0000 mg | Freq: Once | INTRAVENOUS | Status: AC
  Administered 2014-12-15: 125 mg via INTRAVENOUS
  Filled 2014-12-15: qty 10

## 2014-12-15 MED ORDER — PIPERACILLIN-TAZOBACTAM 3.375 G IVPB
3.3750 g | Freq: Three times a day (TID) | INTRAVENOUS | Status: DC
  Administered 2014-12-16 – 2014-12-18 (×9): 3.375 g via INTRAVENOUS
  Filled 2014-12-15 (×11): qty 50

## 2014-12-15 MED ORDER — IOHEXOL 300 MG/ML  SOLN
50.0000 mL | Freq: Once | INTRAMUSCULAR | Status: AC | PRN
  Administered 2014-12-15: 50 mL via ORAL

## 2014-12-15 MED ORDER — OXYCODONE HCL 20 MG/ML PO CONC
20.0000 mg | Freq: Four times a day (QID) | ORAL | Status: DC
  Administered 2014-12-15 – 2014-12-19 (×15): 20 mg via ORAL
  Filled 2014-12-15 (×16): qty 1

## 2014-12-15 MED ORDER — SODIUM CHLORIDE 0.9 % IJ SOLN
3.0000 mL | Freq: Two times a day (BID) | INTRAMUSCULAR | Status: DC
  Administered 2014-12-17 – 2015-01-01 (×11): 3 mL via INTRAVENOUS

## 2014-12-15 MED ORDER — SODIUM CHLORIDE 0.9 % IJ SOLN
10.0000 mL | Freq: Two times a day (BID) | INTRAMUSCULAR | Status: DC
  Administered 2014-12-15 – 2015-01-01 (×24): 10 mL via INTRAVENOUS

## 2014-12-15 MED ORDER — CEFEPIME HCL 2 G IJ SOLR
2.0000 g | Freq: Once | INTRAMUSCULAR | Status: DC
  Administered 2014-12-15: 2 g via INTRAVENOUS
  Filled 2014-12-15: qty 2

## 2014-12-15 MED ORDER — IBUPROFEN 400 MG PO TABS
600.0000 mg | ORAL_TABLET | Freq: Once | ORAL | Status: DC
  Filled 2014-12-15: qty 2

## 2014-12-15 MED ORDER — METOCLOPRAMIDE HCL 5 MG/ML IJ SOLN
5.0000 mg | Freq: Three times a day (TID) | INTRAMUSCULAR | Status: DC
  Administered 2014-12-15 – 2014-12-30 (×45): 5 mg via INTRAVENOUS
  Filled 2014-12-15 (×46): qty 2

## 2014-12-15 MED ORDER — VANCOMYCIN HCL 10 G IV SOLR
1500.0000 mg | Freq: Once | INTRAVENOUS | Status: DC
  Filled 2014-12-15: qty 1500

## 2014-12-15 MED ORDER — SODIUM CHLORIDE 0.9 % IV SOLN: INTRAVENOUS | Status: AC

## 2014-12-15 MED ORDER — HYDROMORPHONE HCL 1 MG/ML IJ SOLN
0.5000 mg | Freq: Once | INTRAMUSCULAR | Status: AC
  Administered 2014-12-15: 0.5 mg via INTRAVENOUS
  Filled 2014-12-15: qty 1

## 2014-12-15 MED ORDER — RISAQUAD PO CAPS
1.0000 | ORAL_CAPSULE | Freq: Every day | ORAL | Status: DC
  Administered 2014-12-16 – 2015-01-01 (×17): 1 via ORAL
  Filled 2014-12-15 (×18): qty 1

## 2014-12-15 NOTE — Progress Notes (Signed)
Pharmacy Note:  Vancomycin, Cefepime and Zosyn Indication:  Sepsis, suspected pneumonia  Initial antibiotics for Vancomycin, Cefepime and Zosyn given on admission.  Ordered when patient presented to ED.  Estimated Creatinine Clearance: 133.3 mL/min (by C-G formula based on Cr of 0.68).   Allergies  Allergen Reactions  . Remicade [Infliximab]     Dr Karilyn Cota states previous respiratory arrest not related to remicade. Dr Karilyn Cota states remicade not a drug related allergy. 07-07-2013 at 1025 rapid response called to PACU- Patient difficulty breathing after infusion of Remicade infusing. Do NOT Give Remicade!  . Fentanyl And Related Itching    Swelling, Redness  . Alfentanil Itching    Swelling, Redness  . Wellbutrin [Bupropion] Nausea And Vomiting  . Morphine And Related Hives    Filed Vitals:   12/15/14 1630  BP: 105/60  Pulse: 106  Temp:   Resp: 20    Anti-infectives    Start     Dose/Rate Route Frequency Ordered Stop   12/15/14 1715  piperacillin-tazobactam (ZOSYN) IVPB 3.375 g     3.375 g100 mL/hr over 30 Minutes Intravenous  Once 12/15/14 1714     12/15/14 1630  vancomycin (VANCOCIN) 1,500 mg in sodium chloride 0.9 % 500 mL IVPB  Status:  Discontinued     1,500 mg250 mL/hr over 120 Minutes Intravenous  Once 12/15/14 1559 12/15/14 1647   12/15/14 1600  ceFEPIme (MAXIPIME) 2 g in dextrose 5 % 50 mL IVPB  Status:  Discontinued     2 g100 mL/hr over 30 Minutes Intravenous  Once 12/15/14 1559 12/15/14 1703     Plan:  Initial doses of Vancomycin 1500mg , Cefepime 2gm and Zosyn 3.375gm X 1 ordered in ED.  F/U admission orders for further orders and appropriate dosing if antibiotics continued.  Wayland Denis, [facility] 12/15/2014 5:19 PM

## 2014-12-15 NOTE — ED Notes (Signed)
Pt is on TPN now.

## 2014-12-15 NOTE — ED Notes (Signed)
Critical glucose level called in by Sarah: 1182  Glucose level read back by Lurena Joiner A Dr. Elesa Massed notified

## 2014-12-15 NOTE — ED Notes (Signed)
David [name] called back from lab and will run glucose with another tube, in case contaminated by TPN.

## 2014-12-15 NOTE — H&P (Signed)
[facility] History and Physical  David [name] ZOX:[id number] DOB: August 04, 1976 DOA: 12/15/2014  Referring physician: ED PCP: [name], MD  Specialists: GI  Chief Complaint: SOB, F  HPI:  Unfortunate 39 y/o ? 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 and decided to have half grilled cheese sandwich and some crackers and felt tired. He fell asleep and awoke choking, he felt weak and could not move he is normally ambulatory His wife states that he was not able to get out of bed he was short of breath on normal movement and he had a fast heartbeat and patient was brought back to the emergency room as it was felt that he had clinically aspirated.  He feels a little better now with IV fluids because it is in chronic pain from his abdominal issue. Note that he was getting IV Dilaudid 2 mg in the hospital however has only been able to get 0.5 mg here in the emergency room because of his hypotension. It was noted that his temperature was 101.2 rectally, he had a new white count of 17,000 whereas on discharge was 8.1. Lactic  acid was 1.0 3.2 troponin was 0.03, his chest x-ray showed a right and new left base airspace disease suspicious for infection. The bowel sounds showed minimal small bowel air fluid levels within the left side of the abdomen no free peritoneal air and it was thought that may be patient had an ileus     Review of Systems:  Feels short of breath No fever No chills No regular No chest pain Abdominal pain as 9/10 and is on the right side of the abdomen intermittent sharp  Past Medical History  Diagnosis Date  . 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 Hepatomegaly 05/04/2011  . Fatty liver 05/04/2011    "stage III fatty liver fibrosis" (11/15/2012)  . GERD (gastroesophageal reflux disease)   . Chronic liver disease     /notes 11/15/2012  . Pericarditis     [name] 11/15/2012  . Hypertension   . Pneumonia 1977  . Shortness of breath     "all the time right now" (11/15/2012)  . History of blood transfusion 2004  . History of stomach ulcers   . Duodenal ulcer   . Depression   . Kidney stones     bilaterally/notes 11/15/2012  . Hepatic fibrosis     [name] 11/15/2012  . Anemia   . Pericardial effusion 10/29/2012    moderate to large/notes 11/15/2012  . Anxiety   . Hepatitis   . Crohn's disease   . ED (erectile dysfunction)   .  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 Anal examination under anesthesia  02/11/2011    [name]  . Treatment fistula anal  07/03/11    This was a second surgery to repair Anal fistula  . Gastrojejunostomy  2004    "had hole cut in small intestines during endoscopy; had to have OR & leave me open 3 months" (11/15/2012)  . Cholecystectomy  ~ 2003  . Appendectomy  ~ 2003  . Video assisted thoracoscopy  11/17/2012    Procedure: VIDEO ASSISTED THORACOSCOPY;  Surgeon: Loreli Slot, MD;  Location: [facility] OR;   Service: Thoracic;  Laterality: Left;  drainage of left pleural effusion  . Pericardial window  11/17/2012    Procedure: PERICARDIAL WINDOW;  Surgeon: Loreli Slot, MD;  Location: [facility] OR;  Service: Thoracic;  Laterality: N/A;  . Esophagogastroduodenoscopy  01/06/2013    Procedure: ESOPHAGOGASTRODUODENOSCOPY (EGD);  Surgeon: Malissa Hippo, MD;  Location[facility];  Service: Endoscopy;  Laterality: N/A;  11:15  . Esophagogastroduodenoscopy N/A 03/09/2014    Dr. Karilyn Cota: erosive/ulcerative reflux esophagiits, portal gastropathy, moderate amount of bile and food debris in stomach precluding visualization of GJ anastomosis  . Gastrostomy tube placement  11/19/14    [facility]: 26 F APDL catheter. Checked again 12/23 via fluoroscopy and in suitable position  . Colonoscopy  Aug 2015    Dr. Edyth Gunnels at [facility]: congested mucosa in entire colon, solitary ulcer distal ileum, stricture in ileum 5 cm from IC valve s/p dilation. Surveillance in 1 year  . Exploratory laparotomy  Nov 05, 2014    [facility], Dr. Byrd Hesselbach. Several hour operation with unsuccessful lysis of adhesions due to frozen abdomen   Social History:  History   Social History Narrative   Crohns h/o      This is a 39 y.o. year old male with Crohn's disease. He had symptoms of abdominal pain since he was 14 but was diagnosed in 2002-2004. He had an EGD at some point that showed esophageal, gastric and antral ulcers. It is not clear if this was related to Crohn's or peptic. He was treated with acid suppressive therapy without much luck so he had another EGD which was complicated by duodenal perforation that required surgical repair/gastrojejunostomy. His post-op course was complicated by wound infection. At some point he was diagnosed with ileocolonic Crohn's disease. Historically he was treated with oral 5 ASA, prednisone, Imuran, MTX and anti-TNF therapy. He started with Remicade and was on it for 2 months without improvement so it was  stopped and patient was switched to 6 MP. While on the 6 MP he had to be on steroids most of the time so 6 MP was discontinued. He had no side effects with the 6 MP. Around that time he was treated with MTX weekly injection, he said on and off for few years. He tells me that after that he tried Humira 6-12 months and Cimzia 6-12 month without prolonged response. It always seemed that he has initial response to the therapy that quickly wears off. He was re-challenged with Remicade around 2008 and stayed on it since then. His dose and dosing interval had to be changed multiple times due to clinical symptoms. 2-3 months ago he had an anaphylactic reactions to Remicade x 2 despite that he was pre-medicated with 200 mg of hydrocortisone. His Remicade was stopped. No Remicade level or antibody was checked. It is not  clear why he was not on IMM while on Remicade.       About 1-2 years he was diagnosed with cirrhosis, he tells me it's due to MTX although he was on weekly injections and only for few years. There is mention of possible NASH as an etiology seen on liver bx in the reviewed notes. He is followed at [facility] for his cirrhosis, his complications are mainly ascites and edema. He also has CRI. Multiple imaging studies including MRE and CTE showing active disease in TI. TPMT normal October 2014. EGD in 1/14 showed esophagitis, gastritis, PHG and gastrojejunostomy. 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 EGD 11/14 with Grade I varices, colonoscopy with stenosed ileocecal valve that could not be traversed, and SBFT with gastric pylorus ulcer, duodenal diverticulum, possible duodenal Crohn's, and evidence of long standing small bowel Crohn's       Allergies  Allergen Reactions  . Remicade [Infliximab]     Dr Karilyn Cota states previous respiratory arrest not related to remicade. Dr Karilyn Cota states remicade not a drug related allergy. 07-07-2013 at 1025 rapid response called to PACU- Patient  difficulty breathing after infusion of Remicade infusing. Do NOT Give Remicade!  . Fentanyl And Related Itching    Swelling, Redness  . Alfentanil Itching    Swelling, Redness  . Wellbutrin [Bupropion] Nausea And Vomiting  . Morphine And Related Hives    Family History  Problem Relation Age of Onset  . Diabetes Mother   . Diabetes Brother   . Healthy Daughter   . Healthy Son   . Colon cancer Neg Hx     Prior to Admission medications   Medication Sig Start Date End Date Taking? Authorizing Provider  calcium-vitamin D (OSCAL 500/200 D-3) 500-200 MG-UNIT per tablet Take 1 tablet by mouth 2 (two) times daily. 11/27/14  Yes Malissa Hippo, MD  ferrous gluconate (FERGON) 324 MG tablet Take 324 mg by mouth 3 (three) times daily. 11/21/14  Yes Historical Provider, MD  fish oil-omega-3 fatty acids 1000 MG capsule Take 1 g by mouth 3 (three) times daily. 02/09/12  Yes Malissa Hippo, MD  oxyCODONE (ROXICODONE INTENSOL) 20 MG/ML concentrated solution Take 1 mL (20 mg total) by mouth 4 (four) times daily. 12/14/14  Yes Rhetta Mura, MD  pantoprazole (PROTONIX) 40 MG tablet Take 1 tablet (40 mg total) by mouth 2 (two) times daily. 06/18/14  Yes Malissa Hippo, MD  potassium chloride 20 MEQ/15ML (10%) SOLN Take 30 mLs (40 mEq total) by mouth daily. 12/15/14  Yes Rhetta Mura, MD  predniSONE (DELTASONE) 10 MG tablet Take 30 mg by mouth daily. 10/26/14  Yes Historical Provider, MD  Probiotic Product (ALIGN) 4 MG CAPS Take 4 mg by mouth daily.    Yes Historical Provider, MD  sucralfate (CARAFATE) 1 G tablet Take 1 g by mouth 3 (three) times daily before meals.   Yes Historical Provider, MD  dicyclomine (BENTYL) 10 MG capsule Take 1 capsule (10 mg total) by mouth 3 (three) times daily as needed (stomach cramps). Patient taking differently: Take 10 mg by mouth daily as needed for spasms (stomach cramps).  06/18/14   Malissa Hippo, MD  furosemide (LASIX) 40 MG tablet Take 1 tablet (40 mg total)  by mouth daily as needed. Patient taking differently: Take 40 mg by mouth daily as needed for fluid.  11/27/14   Malissa Hippo, MD  levofloxacin (LEVAQUIN) 500 MG tablet Take 1 tablet (500 mg total)  by mouth daily. Patient not taking: Reported on 12/15/2014 12/14/14   Rhetta Mura, MD  metoCLOPramide (REGLAN) 5 MG tablet Take 1 tablet (5 mg total) by mouth 3 (three) times daily as needed for nausea. 05/14/14   Malissa Hippo, MD  polyethylene glycol (MIRALAX / GLYCOLAX) packet Take 17 g by mouth daily as needed for mild constipation or moderate constipation.  10/26/14   Historical Provider, MD  promethazine (PHENERGAN) 25 MG tablet Take 1 tablet (25 mg total) by mouth 2 (two) times daily as needed. nausea 11/27/14   Malissa Hippo, MD  promethazine (PHENERGAN) 25 MG/ML injection Inject 1 mL (25 mg total) into the vein every 6 (six) hours as needed for nausea or vomiting. 12/14/14   Rhetta Mura, MD   Physical Exam: Filed Vitals:   12/15/14 1532 12/15/14 1544 12/15/14 1600 12/15/14 1609  BP:  100/59 107/69 98/59  Pulse:  104 101 112  Temp: 101.2 F (38.4 C)     TempSrc: Rectal     Resp:  20 17 14   Height:      Weight:      SpO2:  97% 98% 99%     General:  Alert frail Caucasian male no apparent distress  Eyes:  EOMI NCAT no pallor noted  ENT: (soft supple  Neck:  Soft supple  Cardiovascular:  S1-S2 tachycardic  Respiratory:  Diffuse bilateral rales, no TVR no TVF  Abdomen:  Shifting dullness, tenderness over right abdomen, mildly tympanitic well laying supine in the epigastrium  Skin:   muskuloskeletal: Anasarca  PsycFlat affect tearful  Neuro tremulous-mild asterixis  Labs on Admission:  Basic Metabolic Panel:  Recent Labs Lab 12/11/14 0450 12/12/14 0315 12/12/14 1110 12/12/14 1354 12/13/14 0440 12/14/14 0551 12/15/14 1409  NA 126* 123* 130* 128* 129* 132* 131*  K 3.8 3.8 4.3 4.8 3.2* 3.1* 3.1*  CL 94* 93* 98 97 97 103 102  CO2 26 26 26 26 27 25  23   GLUCOSE 139* 129* 155* 132* 119* 132* 110*  BUN 44* 29* 25* 24* 21 17 20   CREATININE 0.97 0.76 0.78 0.72 0.61 0.60 0.68  CALCIUM 7.3* 7.1* 7.6* 7.5* 7.4* 7.4* 6.9*  MG 2.1 2.0  --   --  1.7  --   --   PHOS 3.0 2.3  --   --  1.9* 2.2*  --    Liver Function Tests:  Recent Labs Lab 12/11/14 0450 12/12/14 0315 12/13/14 0440 12/14/14 0551 12/15/14 1409  AST 25 28 30 29  34  ALT 20 24 29 31  36  ALKPHOS 70 80 91 92 103  BILITOT 0.7 1.3* 0.7 0.8 0.8  PROT 4.0* 4.2* 4.6* 4.5* 4.6*  ALBUMIN 2.0* 2.2* 2.3* 2.2* 2.2*   No results for input(s): LIPASE, AMYLASE in the last 168 hours. No results for input(s): AMMONIA in the last 168 hours. CBC:  Recent Labs Lab 12/11/14 0450 12/12/14 0315 12/13/14 1027 12/14/14 0551 12/15/14 1409  WBC 4.6 4.8 7.2 8.1 17.8*  NEUTROABS 3.3 3.6 5.5 6.3 16.0*  HGB 6.4* 7.7* 8.4* 8.0* 8.7*  HCT 19.6* 23.8* 25.9* 25.0* 27.1*  MCV 78.4 80.4 81.2 81.4 82.4  PLT 80* 70* 88* 69* 86*   Cardiac Enzymes:  Recent Labs Lab 12/15/14 1409  TROPONINI <0.03    BNP (last 3 results) No results for input(s): PROBNP in the last 8760 hours. CBG:  Recent Labs Lab 12/13/14 1643 12/13/14 2337 12/14/14 0655 12/14/14 1124 12/14/14 1740  GLUCAP 208* 116* 124* 191* 140*  Radiological Exams on Admission: Dg Abd Acute W/chest  12/15/2014   CLINICAL DATA:  Abdominal pain.  Chest pain.  EXAM: ACUTE ABDOMEN SERIES (ABDOMEN 2 VIEW & CHEST 1 VIEW)  COMPARISON:  CT abdomen pelvis 12/09/2014. Chest radiograph 12/09/2014.  FINDINGS: Frontal view of the chest demonstrates right-sided PICC line which terminates at the low SVC.  Patient rotated right. Normal heart size. No pleural effusion or pneumothorax. Similar right and new left base airspace disease.  Abdominal films demonstrate fluid level within the stomach, gastrostomy tube in place. There may be minimal small bowel air-fluid levels within the left side of the abdomen as well. No free intraperitoneal air.  Paucity  of bowel gas on supine imaging. Surgical clips in the right upper quadrant. Linear radiopaque object within the left-sided abdomen is without intra abdominal correlate on the recent CT.  IMPRESSION: 1. Similar right and new left base airspace disease, suspicious for infection or aspiration. 2. Nonspecific bowel gas pattern. Paucity of bowel gas with suggestion of minimal small bowel air-fluid levels. Cannot exclude ileus or mild small bowel obstruction. Linear object projecting over the left side of the abdomen. No definite intra-abdominal correlate on recent CT. This may be external to the patient.   Electronically Signed   By: Jeronimo Greaves M.D.   On: 12/15/2014 15:10     EKG: Independently reviewed. sinus rhythm to sinus tachycardia heart rate around 97 PR interval 0.12 QRS axis 60 no ST-T wave changes across precordium  Assessment/Plan Principal Problem:  Early onset sepsis from Aspiration pneumonia-position from Vanco and cefepime 2 Zosyn monotherapy which should cover intra-abdominal organisms. He does have an aspiration clinically and recalls choking on something. There is no need for MRSA coverage. Because of his abdominal pain which may have a chronic component, I will order a stat ultrasound of the abdomen as well, he does appear distended compared to yesterday's exam slightly and does have shifting dullness. If there is an area to tap, we will evaluate the patient once ultrasound is performed for spontaneous bacterial peritonitis. Until then Zosyn can probably cover most intra-abdominal organisms. Because he is on chronic steroids for his Crohn's disease, I will stress dose him with Solu-Cortef 100 mg tid Trend lactic acid/Pro calcitonin Active Problems:    Gastroparesis-   Crohn's disease of both small and large intestine with fistula   Chronic right lower quadrant pain I have requested consult from Dr. Kendell Bane to address these multiple issues. For now I will restart him on Carafate 1 g 3  times a day with meals I will change his Protonix to IV twice a day, I will also dosing with Reglan IV 5 mg IV 3 times a day I his probiotic-for now and will need placement clear liquids as he is clearly indicated that his gastroparesis and reflux are severe and may have contributed to his aspiration. I'm concerned about the reading of his abdominal x-ray showing ileus but at this stage feel that this may be more reflection of his chronic abdominal pains. An ultrasound abdomen would help delineate some of this pathology is not as clear as a CT scan. If he continues to feel poorly in terms of abdominal pain, we will repeat CT scan abdomen pelvis per discretion of rounding physician in the morning.    Hypertension with now hypotension-patient will need IV fluids despite the fact he has anasarca. This will support his blood pressure. If he gets volume overloaded and he will then need blood pressure support after her  third liter with dopamine low-dose 5 mg. I will ensure that my partner Dr. Margot Ables or Dr. Selena Batten all of his fluid status carefully and repeat chest x-ray may be at around 2200 this evening to ensure that he is not getting more dyspneic because of overload. For this indication he will be admitted to step down unit.    Liver cirrhosis with concern for spontaneous bacterial peritonitis-cover with Zosyn as above, obtain paracentesis if indicated. Last meld score was about 10. We will discontinue his ibuprofen  Multimodal anemia-probably a result of his cirrhosis but also has a pure iron deficit as evidenced by his prior labs of iron 17 and ferritin 18 . I will start him on IV iron and repeat labs in the morning. Of note his PTT is 17.7 and his INR is 1.44   Possible diabetes mellitus-Get A1cagent is on TPN and this will need to continue his Clinimix. We will have pharmacy dose this Unless his blood sugar goes above 200 we will not cover him with insulin at this time  Hyponatremia actually improved from  prior. Asian will need volume in addition to sodium therefore we will keep him on saline at 1 25 cc an hour    *Patient has refused CT scan of the abdomen and pelvis, ultrasound has informed me that they will not be able to complete the study today, blood gas was not able to be obtained  Time spent: 69  Mahala Menghini [facility] Pager [phone]  If 7PM-7AM, please contact night-coverage [email or web address] Password [email or web address] 12/15/2014, 4:45 PM

## 2014-12-15 NOTE — ED Provider Notes (Signed)
This chart was scribed for [name], DO by Ronney Lion, ED Scribe. This patient was seen in room APA06/APA06.   TIME SEEN: 1:38 PM  CHIEF COMPLAINT: Abdominal pain  HPI: David [name] is a 39 y.o. 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 Per nursing notes, patient was discharged from the hospital yesterday but feels no better today. He also complains of SOB because he states he aspirated last night. He endorses low-grade fever, nausea, and diarrhea. He states his legs today are more swollen than usual. Patient reports intermittent pneumonia since February. He has a PICC line for TPN. Patient's PCP is Dr. Gerda Diss.   ROS: See HPI Constitutional: fever Eyes: no drainage  ENT: no runny nose   Cardiovascular:  no chest pain  Resp: SOB  GI: no vomiting, nausea, diarrhea, abdominal pain GU: no dysuria Integumentary: no rash  Allergy: no hives  Musculoskeletal: leg swelling  Neurological: no slurred speech ROS otherwise negative  PAST MEDICAL HISTORY/PAST SURGICAL HISTORY:  Past Medical History  Diagnosis Date  . 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 Hepatomegaly 05/04/2011  . Fatty liver 05/04/2011    "stage III fatty liver fibrosis" (11/15/2012)  . GERD (gastroesophageal reflux disease)   . Chronic liver disease     /notes 11/15/2012  . Pericarditis     [name] 11/15/2012  . Hypertension   . Pneumonia 1977  . Shortness of breath     "all the time right now" (11/15/2012)  . History of blood transfusion 2004  . History of stomach ulcers   . Duodenal ulcer   .  Depression   . Kidney stones     bilaterally/notes 11/15/2012  . Hepatic fibrosis     [name] 11/15/2012  . Anemia   . Pericardial effusion 10/29/2012    moderate to large/notes 11/15/2012  . Anxiety   . Hepatitis   . Crohn's disease   . ED (erectile dysfunction)   . Cirrhosis     secondary to drug effect, +/- fatty liver    MEDICATIONS:  Prior to Admission medications   Medication Sig Start Date End Date Taking? Authorizing Provider  calcium-vitamin D (OSCAL 500/200 D-3) 500-200 MG-UNIT per tablet Take 1 tablet by mouth 2 (two) times daily. 11/27/14   Malissa Hippo, MD  dicyclomine (BENTYL) 10 MG capsule Take 1 capsule (10 mg total) by mouth 3 (three) times daily as needed (stomach cramps). Patient taking differently: Take 10 mg by mouth daily as needed for spasms (stomach cramps).  06/18/14   Malissa Hippo, MD  ferrous gluconate (FERGON) 324 MG tablet Take 324 mg by mouth 3 (three) times daily. 11/21/14   Historical Provider, MD  fish oil-omega-3 fatty acids 1000 MG capsule Take 1 g by mouth 3 (three) times daily. 02/09/12   Malissa Hippo, MD  furosemide (LASIX) 40 MG tablet Take 1 tablet (40 mg total) by mouth daily as needed. Patient taking differently: Take 40 mg by mouth daily as needed for fluid.  11/27/14   Malissa Hippo, MD  levofloxacin (LEVAQUIN) 500 MG tablet Take 1 tablet (500 mg total) by mouth daily. 12/14/14   Jai-Gurmukh  Samtani, MD  metoCLOPramide (REGLAN) 5 MG tablet Take 1 tablet (5 mg total) by mouth 3 (three) times daily as needed for nausea. 05/14/14   Malissa Hippo, MD  oxyCODONE (ROXICODONE INTENSOL) 20 MG/ML concentrated solution Take 1 mL (20 mg total) by mouth 4 (four) times daily. 12/14/14   Rhetta Mura, MD  pantoprazole (PROTONIX) 40 MG tablet Take 1 tablet (40 mg total) by mouth 2 (two) times daily. 06/18/14   Malissa Hippo, MD  polyethylene glycol (MIRALAX / GLYCOLAX) packet Take 17 g by mouth daily as needed for mild constipation or moderate  constipation.  10/26/14   Historical Provider, MD  potassium chloride 20 MEQ/15ML (10%) SOLN Take 30 mLs (40 mEq total) by mouth daily. 12/15/14   Rhetta Mura, MD  predniSONE (DELTASONE) 10 MG tablet Take 30 mg by mouth daily. 10/26/14   Historical Provider, MD  Probiotic Product (ALIGN) 4 MG CAPS Take 4 mg by mouth daily.     Historical Provider, MD  promethazine (PHENERGAN) 25 MG tablet Take 1 tablet (25 mg total) by mouth 2 (two) times daily as needed. nausea 11/27/14   Malissa Hippo, MD  promethazine (PHENERGAN) 25 MG/ML injection Inject 1 mL (25 mg total) into the vein every 6 (six) hours as needed for nausea or vomiting. 12/14/14   Rhetta Mura, MD  sucralfate (CARAFATE) 1 G tablet Take 1 g by mouth 3 (three) times daily before meals.    Historical Provider, MD    ALLERGIES:  Allergies  Allergen Reactions  . Remicade [Infliximab]     Dr Karilyn Cota states previous respiratory arrest not related to remicade. Dr Karilyn Cota states remicade not a drug related allergy. 07-07-2013 at 1025 rapid response called to PACU- Patient difficulty breathing after infusion of Remicade infusing. Do NOT Give Remicade!  . Fentanyl And Related Itching    Swelling, Redness  . Alfentanil Itching    Swelling, Redness  . Wellbutrin [Bupropion] Nausea And Vomiting  . Morphine And Related Hives    SOCIAL HISTORY:  History  Substance Use Topics  . Smoking status: Light Tobacco Smoker -- 0.50 packs/day for 24 years    Types: Cigarettes    Start date: 10/28/2012    Last Attempt to Quit: 11/22/2012  . Smokeless tobacco: Current User    Types: Snuff     Comment: 1/2 pack a month  . Alcohol Use: No    FAMILY HISTORY: Family History  Problem Relation Age of Onset  . Diabetes Mother   . Diabetes Brother   . Healthy Daughter   . Healthy Son   . Colon cancer Neg Hx     EXAM: BP 102/63 mmHg  Pulse 122  Temp(Src) 99.7 F (37.6 C) (Oral)  Resp 16  Ht 5\' 11"  (1.803 m)  Wt 174 lb (78.926 kg)   BMI 24.28 kg/m2  SpO2 100% CONSTITUTIONAL: Alert and oriented and responds appropriately to questions. Chronically ill-appearing; appears pale HEAD: Normocephalic EYES: No conjunctival pallor, PERRL ENT: normal nose; no rhinorrhea; moist mucous membranes; pharynx without lesions noted NECK: Supple, no meningismus, no LAD  CARD: Regular rhythm and tachycardic; S1 and S2 appreciated; no murmurs, no clicks, no rubs, no gallops RESP: Normal chest excursion without splinting or tachypnea; Crackles at bilateral bases ABD/GI: Normal bowel sounds; Abdomen is mildly distended and diffusely mildly tender; no guarding or rebound; there is a large vertical surgical scar in the middle of the abdomen and there is no associated erythema or warmth or drainage, G-tube present in  the right upper abdomen BACK:  The back appears normal and is non-tender to palpation, there is no CVA tenderness EXT: Normal ROM in all joints; non-tender to palpation; normal capillary refill; no cyanosis;  non pitting edema in BLE; PICC line noted in the right upper extremity with no surrounding erythema or warmth or drainage SKIN: Large healing midline surgical incision to abdomen with staples; no erythema, warmth, or drainage NEURO: Moves all extremities equally PSYCH: The patient's mood and manner are appropriate. Grooming and personal hygiene are appropriate.   MEDICAL DECISION MAKING: Patient here with concerns for possible aspiration pneumonia. He is febrile, tachycardic and has slightly low blood pressure. Concern for sepsis. We'll start broad-spectrum antibiotics, IV fluids. Will obtain labs, chest x-ray, abdominal x-ray. Reports his abdominal pain and distention are chronic and unchanged.  ED PROGRESS: Patient's labs show leukocytosis of 18 with left shift. Lactate normal. Urine shows trace hemoglobin but no other sign of infection. Chest x-ray concerning for aspiration pneumonia and also small bowel obstruction which family  reports that they are aware of. Have discussed with patient that I would like to get a CT of his abdomen and pelvis but they refuse stating he just had a CT scan. He has had a history of SBP in the past but no fluid wave on exam currently and again he states that his abdominal pain is chronic, unchanged. He has a nonsurgical abdomen on exam. Discussed with hospitalist for admission to step down. We'll continue IV hydration.   EKG Interpretation  Date/Time:  Saturday December 15 2014 15:11:49 EST Ventricular Rate:  97 PR Interval:  132 QRS Duration: 86 QT Interval:  328 QTC Calculation: 417 R Axis:   31 Text Interpretation:  Sinus rhythm Borderline T abnormalities, inferior leads Baseline wander in lead(s) I Confirmed by WARD,  DO, KRISTEN ([id number]) on 12/15/2014 3:24:50 PM        CRITICAL CARE Performed by: Raelyn Number   Total critical care time: 45 minutes  Critical care time was exclusive of separately billable procedures and treating other patients.  Critical care was necessary to treat or prevent imminent or life-threatening deterioration.  Critical care was time spent personally by me on the following activities: development of treatment plan with patient and/or surrogate as well as nursing, discussions with consultants, evaluation of patient's response to treatment, examination of patient, obtaining history from patient or surrogate, ordering and performing treatments and interventions, ordering and review of laboratory studies, ordering and review of radiographic studies, pulse oximetry and re-evaluation of patient's condition.    I personally performed the services described in this documentation, which was scribed in my presence. The recorded information has been reviewed and is accurate.   [name], DO 12/15/14 2214

## 2014-12-15 NOTE — Progress Notes (Signed)
RT unable to obtain ABG after two attempts. Left brachial attempt was very painful , per patient.

## 2014-12-15 NOTE — Telephone Encounter (Signed)
Patient just discharged with home health on TPN yesterday. Wife called the nurses station on the third floor and asked to speak to me this morning . I called her back. Unfortunately, wife reports patient had a bad night - may have aspirated. Some shortness of breath. May have low-grade fever. Felt bad "all over" along with headache. Visited by home health nurse once about midnight last night. TPN ongoing. G tube reported to be venting very well. I spoke to the patient. He sounded obviously dyspneic. He could not speak in complete sentences. I told the spouse to bring him immediately back to the emergency department. I called the ED and alerted Dr. Rennis Chris. Also discussed this new information with Dr. Mahala Menghini.

## 2014-12-15 NOTE — ED Notes (Signed)
Pt states he was discharged yesterday from the hospital states he is no better today

## 2014-12-16 ENCOUNTER — Inpatient Hospital Stay (HOSPITAL_COMMUNITY): Payer: Medicare Other

## 2014-12-16 DIAGNOSIS — K7469 Other cirrhosis of liver: Secondary | ICD-10-CM

## 2014-12-16 LAB — COMPREHENSIVE METABOLIC PANEL
ALT: 30 U/L (ref 0–53)
AST: 26 U/L (ref 0–37)
Albumin: 2 g/dL — ABNORMAL LOW (ref 3.5–5.2)
Alkaline Phosphatase: 93 U/L (ref 39–117)
Anion gap: 3 — ABNORMAL LOW (ref 5–15)
BUN: 21 mg/dL (ref 6–23)
CO2: 24 mmol/L (ref 19–32)
Calcium: 7.1 mg/dL — ABNORMAL LOW (ref 8.4–10.5)
Chloride: 105 mEq/L (ref 96–112)
Creatinine, Ser: 0.66 mg/dL (ref 0.50–1.35)
GFR calc Af Amer: >90 mL/min (ref >90)
GFR calc non Af Amer: >90 mL/min (ref >90)
Glucose, Bld: 122 mg/dL — ABNORMAL HIGH (ref 70–99)
POTASSIUM: 5 mmol/L (ref 3.5–5.1)
SODIUM: 132 mmol/L — AB (ref 135–145)
TOTAL PROTEIN: 4.2 g/dL — AB (ref 6.0–8.3)
Total Bilirubin: 0.8 mg/dL (ref 0.3–1.2)

## 2014-12-16 LAB — PROTIME-INR
INR: 1.46 (ref 0.00–1.49)
Prothrombin Time: 17.8 seconds — ABNORMAL HIGH (ref 11.6–15.2)

## 2014-12-16 LAB — CBC
HCT: 23.7 % — ABNORMAL LOW (ref 39.0–52.0)
HEMATOCRIT: 23.7 % — AB (ref 39.0–52.0)
Hemoglobin: 7.5 g/dL — ABNORMAL LOW (ref 13.0–17.0)
Hemoglobin: 7.6 g/dL — ABNORMAL LOW (ref 13.0–17.0)
MCH: 26.2 pg (ref 26.0–34.0)
MCH: 26.6 pg (ref 26.0–34.0)
MCHC: 31.6 g/dL (ref 30.0–36.0)
MCHC: 32.1 g/dL (ref 30.0–36.0)
MCV: 82.9 fL (ref 78.0–100.0)
MCV: 82.9 fL (ref 78.0–100.0)
PLATELETS: 85 10*3/uL — AB (ref 150–400)
PLATELETS: 71 10*3/uL — AB (ref 150–400)
RBC: 2.86 MIL/uL — ABNORMAL LOW (ref 4.22–5.81)
RBC: 2.86 MIL/uL — AB (ref 4.22–5.81)
RDW: 18.6 % — AB (ref 11.5–15.5)
RDW: 18.6 % — ABNORMAL HIGH (ref 11.5–15.5)
WBC: 9.2 10*3/uL (ref 4.0–10.5)
WBC: 9 10*3/uL (ref 4.0–10.5)

## 2014-12-16 LAB — PROCALCITONIN: PROCALCITONIN: 0.2 ng/mL

## 2014-12-16 MED ORDER — TRACE MINERALS CR-CU-F-FE-I-MN-MO-SE-ZN IV SOLN
INTRAVENOUS | Status: AC
  Administered 2014-12-16: 18:00:00 via INTRAVENOUS
  Filled 2014-12-16: qty 2000

## 2014-12-16 MED ORDER — FUROSEMIDE 40 MG PO TABS
40.0000 mg | ORAL_TABLET | Freq: Every day | ORAL | Status: DC
  Administered 2014-12-16 – 2014-12-17 (×2): 40 mg via ORAL
  Filled 2014-12-16 (×2): qty 1

## 2014-12-16 MED ORDER — FAT EMULSION 20 % IV EMUL
250.0000 mL | INTRAVENOUS | Status: AC
  Administered 2014-12-16: 250 mL via INTRAVENOUS
  Filled 2014-12-16: qty 250

## 2014-12-16 MED ORDER — POTASSIUM CHLORIDE IN NACL 40-0.9 MEQ/L-% IV SOLN: INTRAVENOUS | Status: DC

## 2014-12-16 MED ORDER — HYDROCORTISONE NA SUCCINATE PF 100 MG IJ SOLR
50.0000 mg | Freq: Three times a day (TID) | INTRAMUSCULAR | Status: DC
  Administered 2014-12-16 – 2014-12-17 (×4): 50 mg via INTRAVENOUS
  Filled 2014-12-16 (×4): qty 2

## 2014-12-16 NOTE — Progress Notes (Signed)
[facility] PROGRESS NOTE  David [name] ZOX:[id number] DOB: [birth year] DOA: 12/15/2014 PCP: [name], MD  Assessment/Plan: 1. Aspiration pneumonia. Other than sips of liquids, patient should be nothing by mouth due to multiple bowel issues. The patient had recently discharged and 8 solid food at home which likely with aspiration. He was started on Zosyn. Clinically he appears to be improving. We'll continue current treatments. 2. End-stage Crohn's disease. Patient is continued on TPN for supportive nutrition. He is only on sips of liquids with medications. Continue on PPI, Carafate and low-dose Reglan. Appreciate GI assistance. He has a gastrostomy tube in place for decompression. 3. Cirrhosis of the liver, secondary to methotrexate therapy in the past. 4. Anemia, likely combination of iron deficiency as well as some element of chronic disease. He received intravenous iron during his last admission. He also received 4 units appear to persist during that last admission. No obvious bleeding. We'll continue to monitor hemoglobin. Transfuse for hemoglobin less than 7. 5. Chronic steroid therapy for Crohn's disease. Currently on stress dose steroids with Solu-Cortef. He does not appear septic or toxic. We'll try weaning steroids..  Code Status: full code Family Communication: discussed with patient Disposition Plan: discharge home once improved   Consultants:  gastroenterology  Procedures:    Antibiotics:  Zosyn 12/15/14>  HPI/Subjective: Feeling better today. He does have a mildly productive cough. Shortness of breath has improved  Objective: Filed Vitals:   12/16/14 0831  BP:   Pulse:   Temp: 97.9 F (36.6 C)  Resp:     Intake/Output Summary (Last 24 hours) at 12/16/14 1005 Last data filed at 12/16/14 0600  Gross per 24 hour  Intake 1156.25 ml  Output    850 ml  Net 306.25 ml   Filed Weights   12/15/14 1215 12/15/14 1817 12/16/14 0500  Weight: 78.926 kg (174  lb) 82.2 kg (181 lb 3.5 oz) 85.8 kg (189 lb 2.5 oz)    Exam:   General:  NAD  Cardiovascular: S1, S2 RRR  Respiratory: crackles at right base, diminished sounds at left base  Abdomen: distended, g tube in place, bs+, soft, diffuse tenderness  Musculoskeletal: lower extremity edema present   Data Reviewed: Basic Metabolic Panel:  Recent Labs Lab 12/11/14 0450 12/12/14 0315  12/12/14 1354 12/13/14 0440 12/14/14 0551 12/15/14 1409 12/16/14 0503  NA 126* 123*  < > 128* 129* 132* 131* 132*  K 3.8 3.8  < > 4.8 3.2* 3.1* 3.1* 5.0  CL 94* 93*  < > 97 97 103 102 105  CO2 26 26  < > GLUCOSE 139* 129*  < > 132* 119* 132* 110* 122*  BUN 44* 29*  < > 24* CREATININE 0.97 0.76  < > 0.72 0.61 0.60 0.68 0.66  CALCIUM 7.3* 7.1*  < > 7.5* 7.4* 7.4* 6.9* 7.1*  MG 2.1 2.0  --   --  1.7  --   --   --   PHOS 3.0 2.3  --   --  1.9* 2.2*  --   --   < > = values in this interval not displayed. Liver Function Tests:  Recent Labs Lab 12/12/14 0315 12/13/14 0440 12/14/14 0551 12/15/14 1409 12/16/14 0503  AST 34 26  ALT 36 30  ALKPHOS 80 91 92 103 93  BILITOT 1.3* 0.7 0.8 0.8 0.8  PROT 4.2* 4.6* 4.5* 4.6* 4.2*  ALBUMIN 2.2* 2.3*  2.2* 2.2* 2.0*   No results for input(s): LIPASE, AMYLASE in the last 168 hours. No results for input(s): AMMONIA in the last 168 hours. CBC:  Recent Labs Lab 12/11/14 0450 12/12/14 0315 12/13/14 1027 12/14/14 0551 12/15/14 1409 12/16/14 0503  WBC 4.6 4.8 7.2 8.1 17.8* 9.2  NEUTROABS 3.3 3.6 5.5 6.3 16.0*  --   HGB 6.4* 7.7* 8.4* 8.0* 8.7* 7.5*  HCT 19.6* 23.8* 25.9* 25.0* 27.1* 23.7*  MCV 78.4 80.4 81.2 81.4 82.4 82.9  PLT 80* 70* 88* 69* 86* 85*   Cardiac Enzymes:  Recent Labs Lab 12/15/14 1409  TROPONINI <0.03   BNP (last 3 results) No results for input(s): PROBNP in the last 8760 hours. CBG:  Recent Labs Lab 12/13/14 1643 12/13/14 2337 12/14/14 0655 12/14/14 1124 12/14/14 1740   GLUCAP 208* 116* 124* 191* 140*    Recent Results (from the past 240 hour(s))  Blood culture (routine x 2)     Status: None   Collection Time: 12/09/14  5:36 PM  Result Value Ref Range Status   Specimen Description BLOOD RIGHT ANTECUBITAL  Final   Special Requests BOTTLES DRAWN AEROBIC AND ANAEROBIC 10CC  Final   Culture NO GROWTH 6 DAYS  Final   Report Status 12/15/2014 FINAL  Final  Blood culture (routine x 2)     Status: None   Collection Time: 12/09/14  5:45 PM  Result Value Ref Range Status   Specimen Description BLOOD LEFT HAND  Final   Special Requests BOTTLES DRAWN AEROBIC ONLY 5CC  Final   Culture NO GROWTH 6 DAYS  Final   Report Status 12/15/2014 FINAL  Final  MRSA PCR Screening     Status: None   Collection Time: 12/15/14  6:00 PM  Result Value Ref Range Status   MRSA by PCR NEGATIVE NEGATIVE Final    Comment:        The GeneXpert MRSA Assay (FDA approved for NASAL specimens only), is one component of a comprehensive MRSA colonization surveillance program. It is not intended to diagnose MRSA infection nor to guide or monitor treatment for MRSA infections.      Studies: US Abdomen Limited  12/16/2014   CLINICAL DATA:  Evaluate for ascites.  EXAM: LIMITED ABDOMEN ULTRASOUND FOR ASCITES  TECHNIQUE: Limited ultrasound survey for ascites was performed in all four abdominal quadrants.  COMPARISON:  12/09/2014 CT  FINDINGS: Focused ultrasound of the abdomen demonstrates no evidence of ascites.  IMPRESSION: No significant ascites.   Electronically Signed   By: Jeronimo Greaves M.D.   On: 12/16/2014 10:03   Dg Abd Acute W/chest  12/15/2014   CLINICAL DATA:  Abdominal pain.  Chest pain.  EXAM: ACUTE ABDOMEN SERIES (ABDOMEN 2 VIEW & CHEST 1 VIEW)  COMPARISON:  CT abdomen pelvis 12/09/2014. Chest radiograph 12/09/2014.  FINDINGS: Frontal view of the chest demonstrates right-sided PICC line which terminates at the low SVC.  Patient rotated right. Normal heart size. No pleural  effusion or pneumothorax. Similar right and new left base airspace disease.  Abdominal films demonstrate fluid level within the stomach, gastrostomy tube in place. There may be minimal small bowel air-fluid levels within the left side of the abdomen as well. No free intraperitoneal air.  Paucity of bowel gas on supine imaging. Surgical clips in the right upper quadrant. Linear radiopaque object within the left-sided abdomen is without intra abdominal correlate on the recent CT.  IMPRESSION: 1. Similar right and new left base airspace disease, suspicious for infection or aspiration. 2.  Nonspecific bowel gas pattern. Paucity of bowel gas with suggestion of minimal small bowel air-fluid levels. Cannot exclude ileus or mild small bowel obstruction. Linear object projecting over the left side of the abdomen. No definite intra-abdominal correlate on recent CT. This may be external to the patient.   Electronically Signed   By: Jeronimo Greaves M.D.   On: 12/15/2014 15:10    Scheduled Meds: . acidophilus  1 capsule Oral Daily  . hydrocortisone sod succinate (SOLU-CORTEF) inj  100 mg Intravenous Q8H  . metoCLOPramide (REGLAN) injection  5 mg Intravenous 3 times per day  . oxyCODONE  20 mg Oral QID  . pantoprazole (PROTONIX) IV  40 mg Intravenous Q12H  . piperacillin-tazobactam (ZOSYN)  IV  3.375 g Intravenous Q8H  . sodium chloride  10 mL Intravenous Q12H  . sodium chloride  3 mL Intravenous Q12H  . sucralfate  1 g Oral TID AC   Continuous Infusions: . 0.9 % NaCl with KCl 40 mEq / L 75 mL/hr at 12/16/14 0600  . 0.9 % NaCl with KCl 40 mEq / L    . Marland KitchenTPN (CLINIMIX-E) Adult     And  . fat emulsion      Principal Problem:   Aspiration pneumonia Active Problems:   Crohn's disease of both small and large intestine with fistula   Chronic right lower quadrant pain   Hypertension   Liver cirrhosis   Gastroparesis   Aspiration into airway    Time spent:    [facility]  [facility] Pager [phone]. If 7PM-7AM, please contact night-coverage at [email or web address], password [email or web address] 12/16/2014, 10:05 AM  LOS: 1 day

## 2014-12-16 NOTE — Progress Notes (Signed)
PARENTERAL NUTRITION CONSULT NOTE - follow up  Pharmacy Consult for TPN Indication: intolerance to oral feeding  Allergies  Allergen Reactions  . Remicade [Infliximab]     Dr Karilyn Cota states previous respiratory arrest not related to remicade. Dr Karilyn Cota states remicade not a drug related allergy. 07-07-2013 at 1025 rapid response called to PACU- Patient difficulty breathing after infusion of Remicade infusing. Do NOT Give Remicade!  . Fentanyl And Related Itching    Swelling, Redness  . Alfentanil Itching    Swelling, Redness  . Wellbutrin [Bupropion] Nausea And Vomiting  . Morphine And Related Hives   Patient Measurements: Height:  (180.3 cm) Weight: 189 lb 2.5 oz (85.8 kg) IBW/kg (Calculated) : 75.3 Adjusted Body Weight: n/a  Vital Signs: Temp: 97.9 F (36.6 C) (01/03 0831) Temp Source: Oral (01/03 0831) BP: 111/94 mmHg (01/03 0700) Pulse Rate: 64 (01/03 0700) Intake/Output from previous day: 01/02 0701 - 01/03 0700 In: 1156.3 [I.V.:846.3; IV Piggyback:310] Out: 850 [Urine:850] Intake/Output from this shift:    Labs:  Recent Labs  12/14/14 0551 12/15/14 1409 12/16/14 0503  WBC 8.1 17.8* 9.2  HGB 8.0* 8.7* 7.5*  HCT 25.0* 27.1* 23.7*  PLT 69* 86* 85*  APTT  --  30  --   INR  --  1.44 1.46     Recent Labs  12/14/14 0551 12/15/14 1409 12/16/14 0503  NA 132* 131* 132*  K 3.1* 3.1* 5.0  CL 103 102 105  CO2 GLUCOSE 132* 110* 122*  BUN CREATININE 0.60 0.68 0.66  CALCIUM 7.4* 6.9* 7.1*  PHOS 2.2*  --   --   PROT 4.5* 4.6* 4.2*  ALBUMIN 2.2* 2.2* 2.0*  AST 29 34 26  ALT 31 36 30  ALKPHOS 92 103 93  BILITOT 0.8 0.8 0.8   Estimated Creatinine Clearance: 133.3 mL/min (by C-G formula based on Cr of 0.66).    Recent Labs  12/14/14 0655 12/14/14 1124 12/14/14 1740  GLUCAP 124* 191* 140*   Medical History: Past Medical History  Diagnosis Date  . 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 Crohn's disease with fistula 05/04/2011    both  large and small intestinges/notes 11/15/2012  . Hepatomegaly 05/04/2011  . Fatty liver 05/04/2011    "stage III fatty liver fibrosis" (11/15/2012)  . GERD (gastroesophageal reflux disease)   . Chronic liver disease     /notes 11/15/2012  . Pericarditis     [name] 11/15/2012  . Hypertension   . Pneumonia 1977  . Shortness of breath     "all the time right now" (11/15/2012)  . History of blood transfusion 2004  . History of stomach ulcers   . Duodenal ulcer   . Depression   . Kidney stones     bilaterally/notes 11/15/2012  . Hepatic fibrosis     [name] 11/15/2012  . Anemia   . Pericardial effusion 10/29/2012    moderate to large/notes 11/15/2012  . Anxiety   . Hepatitis   . Crohn's disease   . ED (erectile dysfunction)   . Cirrhosis     secondary to drug effect, +/- fatty liver   Insulin Requirements in the past 24 hours:   Current Nutrition:  TPN  Assessment: Brief narrative: 39 y/o ? hx crohns diag ~2002, symptoms since age 37 s/p Gastrojejunostomy 2004 complicated open abd + Mesh, Cirrhosis 2/2 to MTX use? 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 States that he just been seen at [facility] 12/05/14 to change out his PEG tube to a larger size.  Was sent home to be with family over [place]. Over that period of time he progressively got weaker and weaker and felt more ill or nauseous and was unable to keep down much by mouth.   He was just discharged from [facility] 2 days ago with TPN but was readmitted yesterday due to choking and possible aspiration after eating at home.    Nutritional Goals:  2000-2200 kCal, 100-115 grams of protein per day  Plan:   Resume TPN with Clinimix E 5/15 at 86ml/hr today (start at 6pm)  Provide lipids, trace elements, and MVI with TPN  Adjust maintenance IVF rate accordingly  Monitor lytes, triglycerides, glucose  tolerance, renal fxn, and fluid status  Valrie Hart A 12/16/2014,9:03 AM

## 2014-12-16 NOTE — Progress Notes (Signed)
I have discussed the case at length with Dr. Kerry Hough.  Patient readmitted with aspiration pneumonia after ingesting regular food at home the day he was discharged. He has rapidly improved back to his recent baseline.  He does have significant lower extreme edema. If weights are accurate, he is up almost 40 pounds compared to his weight at the time of admission during his last hospitalization. Hemoglobin back down to 7.5.  No obvious GI bleeding.   Vital signs in last 24 hours: Temp:  [97.9 F (36.6 C)-101.2 F (38.4 C)] 97.9 F (36.6 C) (01/03 0831) Pulse Rate:  [58-122] 64 (01/03 0700) Resp:  [11-20] 15 (01/03 0700) BP: (86-111)/(51-94) 111/94 mmHg (01/03 0700) SpO2:  [95 %-100 %] 100 % (01/03 0700) Weight:  [174 lb (78.926 kg)-189 lb 2.5 oz (85.8 kg)] 189 lb 2.5 oz (85.8 kg) (01/03 0500) Last BM Date: 12/16/14 (per G-tube)   General:   Alert,  cachectic, appearin pleasant and cooperative in NAD Abdomen:  Distended. Venting G-tube in place. Soft with minimal diffuse tenderness.  Extremities:  lower extremity edema present.     Intake/Output from previous day: 01/02 0701 - 01/03 0700 In: 1156.3 [I.V.:846.3; IV Piggyback:310] Out: 850 [Urine:850] Intake/Output this shift:    Lab Results:  Recent Labs  12/14/14 0551 12/15/14 1409 12/16/14 0503  WBC 8.1 17.8* 9.2  HGB 8.0* 8.7* 7.5*  HCT 25.0* 27.1* 23.7*  PLT 69* 86* 85*   BMET  Recent Labs  12/14/14 0551 12/15/14 1409 12/16/14 0503  NA 132* 131* 132*  K 3.1* 3.1* 5.0  CL 103 102 105  CO2 GLUCOSE 132* 110* 122*  BUN CREATININE 0.60 0.68 0.66  CALCIUM 7.4* 6.9* 7.1*   LFT  Recent Labs  12/16/14 0503  PROT 4.2*  ALBUMIN 2.0*  AST 26  ALT 30  ALKPHOS 93  BILITOT 0.8   PT/INR  Recent Labs  12/15/14 1409 12/16/14 0503  LABPROT 17.7* 17.8*  INR 1.44 1.46  Studies/Results: Dg Abd Acute W/chest  12/15/2014   CLINICAL DATA:  Abdominal pain.  Chest pain.  EXAM: ACUTE ABDOMEN SERIES  (ABDOMEN 2 VIEW & CHEST 1 VIEW)  COMPARISON:  CT abdomen pelvis 12/09/2014. Chest radiograph 12/09/2014.  FINDINGS: Frontal view of the chest demonstrates right-sided PICC line which terminates at the low SVC.  Patient rotated right. Normal heart size. No pleural effusion or pneumothorax. Similar right and new left base airspace disease.  Abdominal films demonstrate fluid level within the stomach, gastrostomy tube in place. There may be minimal small bowel air-fluid levels within the left side of the abdomen as well. No free intraperitoneal air.  Paucity of bowel gas on supine imaging. Surgical clips in the right upper quadrant. Linear radiopaque object within the left-sided abdomen is without intra abdominal correlate on the recent CT.  IMPRESSION: 1. Similar right and new left base airspace disease, suspicious for infection or aspiration. 2. Nonspecific bowel gas pattern. Paucity of bowel gas with suggestion of minimal small bowel air-fluid levels. Cannot exclude ileus or mild small bowel obstruction. Linear object projecting over the left side of the abdomen. No definite intra-abdominal correlate on recent CT. This may be external to the patient.   Electronically Signed   By: Jeronimo Greaves M.D.   On: 12/15/2014 15:10    Impression: Unfortunate 39 year old male with end-stage Crohn's disease and cirrhosis readmitted with aspiration pneumonia. He has a nonfunctioning GI tract. G-tube in place for venting. On TPN.  His  overall prognosis is poor short of organ transplantation.  Recommendations:   Agree with twice a day PPI, Carafate and low-dose Reglan.  Beyond sips of liquid, he should remain nothing by mouth Continue TPN.  Diuretic therapy per Dr. Kerry Hough.   Bedside ultrasound underway. Although he likely has ascites, he may not have enough free fluid amenable to a large volume paracentesis.  He may need another unit of packed red blood cells depending on how his hemoglobin trends over the next 24 hours.  I  will discuss the case with Dr. Karilyn Cota tomorrow. I wonder if patient could be transferred as an inpatient down to [facility] to expedite his transplant evaluation (Patient desires transfer if this could be accomplished).

## 2014-12-16 NOTE — Plan of Care (Signed)
Problem: Phase I Progression Outcomes Goal: Dyspnea controlled at rest Outcome: Progressing Patient remains on room air Goal: Pain controlled with appropriate interventions Outcome: Progressing Pain level lowered to 6, out of 10 and patient states happy with that number. Improved Goal: OOB as tolerated unless otherwise ordered Outcome: Progressing OOB to bsc Goal: Code status addressed with pt/family Outcome: Progressing Patient remains a full code Goal: Initial discharge plan identified Outcome: Progressing Plan to go home with family Goal: Hemodynamically stable Outcome: Completed/Met Date Met:  12/16/14 Vitals stable for this patient, hypotension is normal with this patient

## 2014-12-16 NOTE — Progress Notes (Signed)
ANTIBIOTIC CONSULT NOTE - INITIAL  Pharmacy Consult for Zosyn Indication: aspiration pneumonia  Allergies  Allergen Reactions  . Remicade [Infliximab]     Dr Karilyn Cota states previous respiratory arrest not related to remicade. Dr Karilyn Cota states remicade not a drug related allergy. 07-07-2013 at 1025 rapid response called to PACU- Patient difficulty breathing after infusion of Remicade infusing. Do NOT Give Remicade!  . Fentanyl And Related Itching    Swelling, Redness  . Alfentanil Itching    Swelling, Redness  . Wellbutrin [Bupropion] Nausea And Vomiting  . Morphine And Related Hives   Patient Measurements: Height: 5\' 11"  (180.3 cm) Weight: 189 lb 2.5 oz (85.8 kg) IBW/kg (Calculated) : 75.3  (weights fluctuate and may be inaccurate)  Vital Signs: Temp: 97.9 F (36.6 C) (01/03 0831) Temp Source: Oral (01/03 0831) BP: 111/94 mmHg (01/03 0700) Pulse Rate: 64 (01/03 0700) Intake/Output from previous day: 01/02 0701 - 01/03 0700 In: 1156.3 [I.V.:846.3; IV Piggyback:310] Out: 850 [Urine:850] Intake/Output from this shift:    Labs:  Recent Labs  12/14/14 0551 12/15/14 1409 12/16/14 0503  WBC 8.1 17.8* 9.2  HGB 8.0* 8.7* 7.5*  PLT 69* 86* 85*  CREATININE 0.60 0.68 0.66   Estimated Creatinine Clearance: 133.3 mL/min (by C-G formula based on Cr of 0.66). No results for input(s): VANCOTROUGH, VANCOPEAK, VANCORANDOM, GENTTROUGH, GENTPEAK, GENTRANDOM, TOBRATROUGH, TOBRAPEAK, TOBRARND, AMIKACINPEAK, AMIKACINTROU, AMIKACIN in the last 72 hours.   Microbiology: Recent Results (from the past 720 hour(s))  Blood culture (routine x 2)     Status: None   Collection Time: 12/09/14  5:36 PM  Result Value Ref Range Status   Specimen Description BLOOD RIGHT ANTECUBITAL  Final   Special Requests BOTTLES DRAWN AEROBIC AND ANAEROBIC 10CC  Final   Culture NO GROWTH 6 DAYS  Final   Report Status 12/15/2014 FINAL  Final  Blood culture (routine x 2)     Status: None   Collection Time:  12/09/14  5:45 PM  Result Value Ref Range Status   Specimen Description BLOOD LEFT HAND  Final   Special Requests BOTTLES DRAWN AEROBIC ONLY 5CC  Final   Culture NO GROWTH 6 DAYS  Final   Report Status 12/15/2014 FINAL  Final  MRSA PCR Screening     Status: None   Collection Time: 12/15/14  6:00 PM  Result Value Ref Range Status   MRSA by PCR NEGATIVE NEGATIVE Final    Comment:        The GeneXpert MRSA Assay (FDA approved for NASAL specimens only), is one component of a comprehensive MRSA colonization surveillance program. It is not intended to diagnose MRSA infection nor to guide or monitor treatment for MRSA infections.    Medical History: Past Medical History  Diagnosis Date  . 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 Hepatomegaly 05/04/2011  . Fatty liver 05/04/2011    "stage III fatty liver fibrosis" (11/15/2012)  . GERD (gastroesophageal reflux disease)   . Chronic liver disease     /notes 11/15/2012  . Pericarditis     [name] 11/15/2012  . Hypertension   . Pneumonia 1977  . Shortness of breath     "all the time right now" (11/15/2012)  . History of blood transfusion 2004  . History of stomach ulcers   . Duodenal ulcer   . Depression   . Kidney stones     bilaterally/notes 11/15/2012  . Hepatic fibrosis     /  notes 11/15/2012  . Anemia   . Pericardial effusion 10/29/2012    moderate to large/notes 11/15/2012  . Anxiety   . Hepatitis   . Crohn's disease   . ED (erectile dysfunction)   . Cirrhosis     secondary to drug effect, +/- fatty liver   Anti-infectives    Start     Dose/Rate Route Frequency Ordered Stop   12/16/14 0600  vancomycin (VANCOCIN) IVPB 750 mg/150 ml premix  Status:  Discontinued     750 mg150 mL/hr over 60 Minutes Intravenous Every 12 hours 12/15/14 1815 12/16/14 0915   12/16/14 0200  piperacillin-tazobactam (ZOSYN) IVPB 3.375 g     3.375 g12.5 mL/hr over  240 Minutes Intravenous Every 8 hours 12/15/14 1815     12/15/14 1715  piperacillin-tazobactam (ZOSYN) IVPB 3.375 g     3.375 g100 mL/hr over 30 Minutes Intravenous  Once 12/15/14 1714 12/15/14 2023   12/15/14 1630  vancomycin (VANCOCIN) 1,500 mg in sodium chloride 0.9 % 500 mL IVPB  Status:  Discontinued     1,500 mg250 mL/hr over 120 Minutes Intravenous  Once 12/15/14 1559 12/15/14 1647   12/15/14 1600  ceFEPIme (MAXIPIME) 2 g in dextrose 5 % 50 mL IVPB  Status:  Discontinued     2 g100 mL/hr over 30 Minutes Intravenous  Once 12/15/14 1559 12/15/14 1703     Assessment: HPI:  Unfortunate 39 y/o ? Who I just discharged 12/14/14 after a 4 to five-day hospitalization for acute abdominal pain probably secondary to frozen abdomen, anemia thought secondary to acute disease as well as iron deficiency , potential chronic aspiration , history Crohn's disease diagnosed 2002 with symptoms as early as age 29 status post gastrojejunostomy 2004 with a complicated abdominal wound and mesh, cirrhosis secondary to [place] versus methotrexate.  During the last admission patient was treated for his chronic abdominal pain, his anemia which was treated with 4 units of packed red blood cells and was sent home in a relatively stable state with thought that he had probable chronic aspiration. Pt readmitted and started on Zosyn for suspected aspiration pneumonia. (Vancomycin and Cefepime also given initially when readmitted)  Goal of Therapy:  Eradicate infection.  Plan:  Zosyn 3.375gm IV q8h, each dose over 4 hrs Monitor labs, cultures, progress and LOT  Margo Aye, Luverne Zerkle A 12/16/2014,9:13 AM

## 2014-12-17 DIAGNOSIS — K7460 Unspecified cirrhosis of liver: Secondary | ICD-10-CM

## 2014-12-17 DIAGNOSIS — D638 Anemia in other chronic diseases classified elsewhere: Secondary | ICD-10-CM | POA: Insufficient documentation

## 2014-12-17 DIAGNOSIS — R601 Generalized edema: Secondary | ICD-10-CM | POA: Insufficient documentation

## 2014-12-17 LAB — COMPREHENSIVE METABOLIC PANEL
ALT: 27 U/L (ref 0–53)
ANION GAP: 5 (ref 5–15)
AST: 20 U/L (ref 0–37)
Albumin: 2.1 g/dL — ABNORMAL LOW (ref 3.5–5.2)
Alkaline Phosphatase: 83 U/L (ref 39–117)
BILIRUBIN TOTAL: 0.6 mg/dL (ref 0.3–1.2)
BUN: 17 mg/dL (ref 6–23)
CHLORIDE: 101 meq/L (ref 96–112)
CO2: 23 mmol/L (ref 19–32)
CREATININE: 0.61 mg/dL (ref 0.50–1.35)
Calcium: 7.4 mg/dL — ABNORMAL LOW (ref 8.4–10.5)
GFR calc Af Amer: >90 mL/min (ref >90)
GLUCOSE: 199 mg/dL — AB (ref 70–99)
Potassium: 3.7 mmol/L (ref 3.5–5.1)
Sodium: 129 mmol/L — ABNORMAL LOW (ref 135–145)
Total Protein: 4.3 g/dL — ABNORMAL LOW (ref 6.0–8.3)

## 2014-12-17 LAB — CBC
HCT: 24.4 % — ABNORMAL LOW (ref 39.0–52.0)
Hemoglobin: 7.7 g/dL — ABNORMAL LOW (ref 13.0–17.0)
MCH: 26.6 pg (ref 26.0–34.0)
MCHC: 31.6 g/dL (ref 30.0–36.0)
MCV: 84.1 fL (ref 78.0–100.0)
Platelets: 84 10*3/uL — ABNORMAL LOW (ref 150–400)
RBC: 2.9 MIL/uL — ABNORMAL LOW (ref 4.22–5.81)
RDW: 19.3 % — ABNORMAL HIGH (ref 11.5–15.5)
WBC: 8.6 10*3/uL (ref 4.0–10.5)

## 2014-12-17 LAB — DIFFERENTIAL
Basophils Absolute: 0 10*3/uL (ref 0.0–0.1)
Basophils Relative: 0 % (ref 0–1)
Eosinophils Absolute: 0 10*3/uL (ref 0.0–0.7)
Eosinophils Relative: 0 % (ref 0–5)
LYMPHS ABS: 0.4 10*3/uL — AB (ref 0.7–4.0)
LYMPHS PCT: 5 % — AB (ref 12–46)
MONOS PCT: 3 % (ref 3–12)
Monocytes Absolute: 0.3 10*3/uL (ref 0.1–1.0)
NEUTROS PCT: 92 % — AB (ref 43–77)
Neutro Abs: 7.9 10*3/uL — ABNORMAL HIGH (ref 1.7–7.7)

## 2014-12-17 LAB — TRIGLYCERIDES: TRIGLYCERIDES: 61 mg/dL (ref <150)

## 2014-12-17 LAB — PREALBUMIN: PREALBUMIN: 11.7 mg/dL — AB (ref 17.0–34.0)

## 2014-12-17 LAB — GLUCOSE, CAPILLARY
GLUCOSE-CAPILLARY: 143 mg/dL — AB (ref 70–99)
Glucose-Capillary: 137 mg/dL — ABNORMAL HIGH (ref 70–99)
Glucose-Capillary: 170 mg/dL — ABNORMAL HIGH (ref 70–99)

## 2014-12-17 LAB — PROCALCITONIN

## 2014-12-17 LAB — HEMOGLOBIN A1C
HEMOGLOBIN A1C: 5.2 % (ref <5.7)
Mean Plasma Glucose: 103 mg/dL (ref <117)

## 2014-12-17 LAB — MAGNESIUM: MAGNESIUM: 1.5 mg/dL (ref 1.5–2.5)

## 2014-12-17 LAB — PHOSPHORUS: Phosphorus: 3.4 mg/dL (ref 2.3–4.6)

## 2014-12-17 MED ORDER — FUROSEMIDE 10 MG/ML IJ SOLN
40.0000 mg | Freq: Two times a day (BID) | INTRAMUSCULAR | Status: DC
  Administered 2014-12-17 – 2014-12-26 (×18): 40 mg via INTRAVENOUS
  Filled 2014-12-17 (×18): qty 4

## 2014-12-17 MED ORDER — TRACE MINERALS CR-CU-F-FE-I-MN-MO-SE-ZN IV SOLN
INTRAVENOUS | Status: AC
  Administered 2014-12-17: 18:00:00 via INTRAVENOUS
  Filled 2014-12-17: qty 1920

## 2014-12-17 MED ORDER — FAT EMULSION 20 % IV EMUL
240.0000 mL | INTRAVENOUS | Status: DC
  Filled 2014-12-17: qty 250

## 2014-12-17 MED ORDER — TRACE MINERALS CR-CU-F-FE-I-MN-MO-SE-ZN IV SOLN
INTRAVENOUS | Status: DC
  Filled 2014-12-17: qty 1920

## 2014-12-17 MED ORDER — FAT EMULSION 20 % IV EMUL
240.0000 mL | INTRAVENOUS | Status: AC
  Administered 2014-12-17: 240 mL via INTRAVENOUS
  Filled 2014-12-17: qty 250

## 2014-12-17 MED ORDER — HYDROCORTISONE NA SUCCINATE PF 100 MG IJ SOLR
50.0000 mg | Freq: Two times a day (BID) | INTRAMUSCULAR | Status: DC
  Administered 2014-12-18 – 2014-12-19 (×4): 50 mg via INTRAVENOUS
  Filled 2014-12-17 (×4): qty 2

## 2014-12-17 MED ORDER — INSULIN ASPART 100 UNIT/ML ~~LOC~~ SOLN
0.0000 [IU] | Freq: Four times a day (QID) | SUBCUTANEOUS | Status: DC
  Administered 2014-12-18 – 2014-12-26 (×11): 3 [IU] via SUBCUTANEOUS

## 2014-12-17 MED ORDER — POTASSIUM CHLORIDE 10 MEQ/100ML IV SOLN
10.0000 meq | INTRAVENOUS | Status: AC
  Administered 2014-12-17 (×3): 10 meq via INTRAVENOUS
  Filled 2014-12-17: qty 100

## 2014-12-17 NOTE — Progress Notes (Signed)
Subjective:  Complains of swelling from feet to testicles. Breathing is better. Some yellow sputum. No abdominal pain. Asking for sips of clears other than ice chips.   Objective: Vital signs in last 24 hours: Temp:  [97.7 F (36.5 C)-98.3 F (36.8 C)] 97.7 F (36.5 C) (01/04 0700) Pulse Rate:  [69-80] 69 (01/04 0700) Resp:  [11-20] 20 (01/04 0700) BP: (95-105)/(61-65) 101/62 mmHg (01/04 0700) SpO2:  [100 %] 100 % (01/04 0700) Last BM Date: 12/15/14 General:   Alert,  Cachetic appearing, pleasant and cooperative in NAD Head:  Normocephalic and atraumatic. Eyes:  Sclera clear, no icterus.  Chest: CTA bilaterally without rales, rhonchi, crackles.    Heart:  Regular rate and rhythm; no murmurs, clicks, rubs,  or gallops. Abdomen:  Soft, slightly distended, nontender. Normal bowel sounds, without guarding, and without rebound.   Extremities:  2+ to thighs bilaterally. Without clubbing, deformity. Neurologic:  Alert and  oriented x4;  grossly normal neurologically. Skin:  Intact without significant lesions or rashes. Psych:  Alert and cooperative. Normal mood and affect.  Intake/Output from previous day: 01/03 0701 - 01/04 0700 In: 1659 [I.V.:403.7; IV Piggyback:150; TPN:1105.3] Out: 1000 [Urine:800; Stool:200] Intake/Output this shift: Total I/O In: 50 [IV Piggyback:50] Out: 200 [Urine:200]  Lab Results: CBC  Recent Labs  12/16/14 0503 12/16/14 1115 12/17/14 0548  WBC 9.2 9.0 8.6  HGB 7.5* 7.6* 7.7*  HCT 23.7* 23.7* 24.4*  MCV 82.9 82.9 84.1  PLT 85* 71* 84*   BMET  Recent Labs  12/15/14 1409 12/16/14 0503 12/17/14 0548  NA 131* 132* 129*  K 3.1* 5.0 3.7  CL 102 105 101  CO2 GLUCOSE 110* 122* 199*  BUN CREATININE 0.68 0.66 0.61  CALCIUM 6.9* 7.1* 7.4*   LFTs  Recent Labs  12/15/14 1409 12/16/14 0503 12/17/14 0548  BILITOT 0.8 0.8 0.6  ALKPHOS 103 93 83  AST 34 26 20  ALT 36 30 27  PROT 4.6* 4.2* 4.3*  ALBUMIN 2.2* 2.0* 2.1*    No results for input(s): LIPASE in the last 72 hours. PT/INR  Recent Labs  12/15/14 1409 12/16/14 0503  LABPROT 17.7* 17.8*  INR 1.44 1.46      Imaging Studies: Ct Abdomen Pelvis Wo Contrast  12/09/2014   CLINICAL DATA:  Weakness for 4 days with low-grade fever, history of Crohn's disease, recent gastrostomy placement  EXAM: CT ABDOMEN AND PELVIS WITHOUT CONTRAST  TECHNIQUE: Multidetector CT imaging of the abdomen and pelvis was performed following the standard protocol without IV contrast.  COMPARISON:  03/07/2014, 10/15/2014  FINDINGS: Lung bases again demonstrate diffuse reticulonodular airspace disease particularly in the right lower lobe but to a lesser degree in the left lower lobe stable from the recent MRI examination.  Gallbladder has been surgically removed. The liver is stable in appearance. The spleen is enlarged in size but stable from the prior MRI examination. Stomach is significantly distended and a gastrostomy catheter is noted in place. The adrenal glands and pancreas are within normal limits. The kidneys are well visualized bilaterally and reveal some cystic change on the right. Fullness of the collecting systems is noted bilaterally although no definitive obstructive changes are seen. No ureteral calculi are seen. Mild ascites is noted.  There is some generalized distention of the small bowel without definitive obstructive change. Changes are similar to that seen on the prior exam. Changes may be related to the patient's given clinical history of Crohn's disease. This appearance is  stable from the prior exam bony structures are stable in appearance. The appendix is not visualized consistent with prior cholecystectomy.  IMPRESSION: Splenomegaly likely related to a degree of portal hypertension. This is stable from previous exams.  Gastrostomy catheter in place within a distended stomach.  Mild fullness of the collecting systems bilaterally although no true obstructive changes are  seen.  Generalized prominence of the small bowel which is stable when compared with prior exams. This may be related to the patient's underlying Crohn's disease.  Mild ascites stable from the prior exam.   Electronically Signed   By: Alcide Clever M.D.   On: 12/09/2014 17:47   US Abdomen Limited  12/16/2014   CLINICAL DATA:  Evaluate for ascites.  EXAM: LIMITED ABDOMEN ULTRASOUND FOR ASCITES  TECHNIQUE: Limited ultrasound survey for ascites was performed in all four abdominal quadrants.  COMPARISON:  12/09/2014 CT  FINDINGS: Focused ultrasound of the abdomen demonstrates no evidence of ascites.  IMPRESSION: No significant ascites.   Electronically Signed   By: Jeronimo Greaves M.D.   On: 12/16/2014 10:03   Ir Replc Gastro/colonic Tube Percut W/fluoro  12/11/2014   CLINICAL DATA:  Crohn's disease with small bowel obstruction. Percutaneous gastrostomy tube placed 3 weeks ago for decompression. Recurrent occlusions of the 12 French catheter. Exchange and up sizing is requested.  EXAM: GASTROSTOMY CATHETER REPLACEMENT  FLUOROSCOPY TIME:  2 min 18 seconds  TECHNIQUE: The procedure, risks, benefits, and alternatives were explained to the patient. Questions regarding the procedure were encouraged and answered. The patient understands and consents to the procedure. A 5 French angiographic catheter was placed as orogastric tube. The upper abdomen was prepped with Betadine, draped in usual sterile fashion, and infiltrated locally with 1% lidocaine.  Intravenous Fentanyl and Versed were administered as conscious sedation during continuous cardiorespiratory monitoring by the radiology RN, with a total moderate sedation time of less than 30 minutes.  The external portion of the previously placed 12 French gastrostomy catheter was cut. The 6 French snare sheath could not be advanced beyond the tip of the pigtail catheter within the lumen of the stomach. An Amplatz guidewire was advanced and the catheter exchanged for a 12 French  peel-away sheath. Through this, the snare device was advanced and used to snare a guidewire passed through the orogastric tube. This was withdrawn, and the snare attached to the 20 French pull-through gastrostomy tube, which was advanced antegrade, positioned with the internal bumper securing the anterior gastric wall to the anterior abdominal wall. Small contrast injection confirms appropriate positioning. The external bumper was applied and the catheter was flushed. No immediate complication.  IMPRESSION: 1. Technically successful gastrostomy exchange under fluoroscopy for a 20 French pull-through device, OK for routine use.   Electronically Signed   By: Oley Balm M.D.   On: 12/11/2014 16:14   Dg Chest Portable 1 View  12/09/2014   CLINICAL DATA:  Decreased oral intake, generalized weakness  EXAM: PORTABLE CHEST - 1 VIEW  COMPARISON:  08/21/2014  FINDINGS: Cardiac shadow is stable. The left lung is clear. The right lung is well aerated but demonstrates some patchy infiltrative changes in the right lung base which may be related aspiration pneumonia. Clinical correlation is recommended.  IMPRESSION: New right basilar infiltrative change. This may represent aspiration pneumonia. Clinical correlation is recommended.   Electronically Signed   By: Alcide Clever M.D.   On: 12/09/2014 14:58   Dg Abd Acute W/chest  12/15/2014   CLINICAL DATA:  Abdominal pain.  Chest pain.  EXAM: ACUTE ABDOMEN SERIES (ABDOMEN 2 VIEW & CHEST 1 VIEW)  COMPARISON:  CT abdomen pelvis 12/09/2014. Chest radiograph 12/09/2014.  FINDINGS: Frontal view of the chest demonstrates right-sided PICC line which terminates at the low SVC.  Patient rotated right. Normal heart size. No pleural effusion or pneumothorax. Similar right and new left base airspace disease.  Abdominal films demonstrate fluid level within the stomach, gastrostomy tube in place. There may be minimal small bowel air-fluid levels within the left side of the abdomen as  well. No free intraperitoneal air.  Paucity of bowel gas on supine imaging. Surgical clips in the right upper quadrant. Linear radiopaque object within the left-sided abdomen is without intra abdominal correlate on the recent CT.  IMPRESSION: 1. Similar right and new left base airspace disease, suspicious for infection or aspiration. 2. Nonspecific bowel gas pattern. Paucity of bowel gas with suggestion of minimal small bowel air-fluid levels. Cannot exclude ileus or mild small bowel obstruction. Linear object projecting over the left side of the abdomen. No definite intra-abdominal correlate on recent CT. This may be external to the patient.   Electronically Signed   By: Jeronimo Greaves M.D.   On: 12/15/2014 15:10  [2 weeks]   Assessment: 39 y/o male with end-stage Crohn's disease and cirrhosis readmitted with aspiration pneumonia and anasarca. He has nonfunctioning GI tract. G-tube in place for venting only. On TPN. Has appt on 12/21/14 for consideration of dual small bowel and liver transplantation. Complains of worsening swelling. Respiratory status improved.   Plan: 1. Continue PPI BID, Carafate, low-dose Reglan.  2. Sips of liquids for pleasure only, otherwise nothing by mouth. 8 ounces per shift. 3. Continue TPN. 4. Diuretic therapy per attending. Difficult management given hyponatremia.  5. Dr. Karilyn Cota to return Wednesday. Continue support here at [facility] for now as patient has stabilized.  6. Really need to keep appointment for Friday.  Leanna Battles. [facility] 1/4/20161:29 PM     LOS: 2 days    Attending note:  Pt seen and examined Agree with above assessment and recommendations

## 2014-12-17 NOTE — Progress Notes (Signed)
PARENTERAL NUTRITION CONSULT NOTE - follow up  Pharmacy Consult for TPN Indication: intolerance to oral feeding  Allergies  Allergen Reactions  . Remicade [Infliximab]     Dr Karilyn Cota states previous respiratory arrest not related to remicade. Dr Karilyn Cota states remicade not a drug related allergy. 07-07-2013 at 1025 rapid response called to PACU- Patient difficulty breathing after infusion of Remicade infusing. Do NOT Give Remicade!  . Fentanyl And Related Itching    Swelling, Redness  . Alfentanil Itching    Swelling, Redness  . Wellbutrin [Bupropion] Nausea And Vomiting  . Morphine And Related Hives   Patient Measurements: Height: 5\' 11"  (180.3 cm) Weight: 189 lb 2.5 oz (85.8 kg) IBW/kg (Calculated) : 75.3 (weights fluctuate and may be inaccurate)  Vital Signs: Temp: 97.7 F (36.5 C) (01/04 0700) Temp Source: Oral (01/04 0700) BP: 101/62 mmHg (01/04 0700) Pulse Rate: 69 (01/04 0700) Intake/Output from previous day: 01/03 0701 - 01/04 0700 In: 1659 [I.V.:403.7; IV Piggyback:150; TPN:1105.3] Out: 1000 [Urine:800; Stool:200] Intake/Output from this shift: Total I/O In: 50 [IV Piggyback:50] Out: 200 [Urine:200]  Labs:  Recent Labs  12/15/14 1409 12/16/14 0503 12/16/14 1115 12/17/14 0548  WBC 17.8* 9.2 9.0 8.6  HGB 8.7* 7.5* 7.6* 7.7*  HCT 27.1* 23.7* 23.7* 24.4*  PLT 86* 85* 71* 84*  APTT 30  --   --   --   INR 1.44 1.46  --   --      Recent Labs  12/15/14 1409 12/16/14 0503 12/17/14 0548  NA 131* 132* 129*  K 3.1* 5.0 3.7  CL 102 105 101  CO2 23 24 23   GLUCOSE 110* 122* 199*  BUN 20 21 17   CREATININE 0.68 0.66 0.61  CALCIUM 6.9* 7.1* 7.4*  MG  --   --  1.5  PHOS  --   --  3.4  PROT 4.6* 4.2* 4.3*  ALBUMIN 2.2* 2.0* 2.1*  AST 34 26 20  ALT 36 30 27  ALKPHOS 103 93 83  BILITOT 0.8 0.8 0.6  TRIG  --   --  61   Estimated Creatinine Clearance: 133.3 mL/min (by C-G formula based on Cr of 0.61).    Recent Labs  12/14/14 1740 12/17/14 1126   GLUCAP 140* 143*   Medical History: Past Medical History  Diagnosis Date  . 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 Hepatomegaly 05/04/2011  . Fatty liver 05/04/2011    "stage III fatty liver fibrosis" (11/15/2012)  . GERD (gastroesophageal reflux disease)   . Chronic liver disease     /notes 11/15/2012  . Pericarditis     [name] 11/15/2012  . Hypertension   . Pneumonia 1977  . Shortness of breath     "all the time right now" (11/15/2012)  . History of blood transfusion 2004  . History of stomach ulcers   . Duodenal ulcer   . Depression   . Kidney stones     bilaterally/notes 11/15/2012  . Hepatic fibrosis     [name] 11/15/2012  . Anemia   . Pericardial effusion 10/29/2012    moderate to large/notes 11/15/2012  . Anxiety   . Hepatitis   . Crohn's disease   . ED (erectile dysfunction)   . Cirrhosis     secondary to drug effect, +/- fatty liver   Insulin Requirements in the past 24 hours:  Not on SSI at this point (will start), none charted  Current Nutrition:  TPN  Assessment: Brief narrative: 39 y/o ? 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 States that he just been seen at [facility] 12/05/14 to change out his PEG tube to a larger size but was not done due to low BP.  Was sent home to be with family over [name]. Over that period of time he progressively got weaker and weaker and felt more ill or nauseous and was unable to keep anything down.  He was just discharged from [facility] 2 days ago with TPN but was readmitted due to choking and possible aspiration after eating at home.   Na remains low despite TPN and NS IV.   Serum Glucose  elevated today.  Nutritional Goals:  2000-2200 kCal, 100-115 grams of protein per day  Plan:   Continue TPN with Clinimix E 5/15 at 47ml/hr today (start at 6pm)  Provide lipids, trace elements, and MVI with TPN  Add sliding scale insulin with CBG checks q6hrs  Monitor lytes, triglycerides, glucose tolerance, renal fxn, and fluid status  David [name], David [name] A 12/17/2014,12:15 PM

## 2014-12-17 NOTE — Care Management Note (Addendum)
    Page 1 of 2   01/01/2015     3:19:39 PM CARE MANAGEMENT NOTE 01/01/2015  Patient:  David [name], David [name]   Account Number:  [id number]  Date Initiated:  12/17/2014  Documentation initiated by:  Sharrie Rothman  Subjective/Objective Assessment:   Pt admitted from home with aspiration pneumonia. Pt lives with his wife and will return home at discharge. Pt is active with [facility] for TPN monitoring.     Action/Plan:   Will continue to follow for discharge planning needs. Pt may transfer to [facility].   Anticipated DC Date:  12/20/2014   Anticipated DC Plan:  ACUTE TO ACUTE TRANS      DC Planning Services  CM consult      PAC Choice  Resumption Of Svcs/PTA Provider   Choice offered to / List presented to:  C-1 Patient   DME arranged  HOSPITAL BED      DME agency  [facility]     [facility] arranged  HH-1 RN      [facility] agency  [facility]   Status of service:  Completed, signed off Medicare Important Message given?  YES (If response is "NO", the following Medicare IM given date fields will be blank) Date Medicare IM given:  12/21/2014 Medicare IM given by:  Sharrie Rothman Date Additional Medicare IM given:  01/01/2015 Additional Medicare IM given by:  Sharrie Rothman  Discharge Disposition:  HOME [facility] SERVICES  Per UR Regulation:    If discussed at Long Length of Stay Meetings, dates discussed:   12/20/2014  12/25/2014  12/27/2014  01/01/2015    Comments:  01/01/15 1515 Arlyss Queen, RN BSN CM Pt discharged home today with [facility] RN for TPN (per pts choice). Alroy Bailiff of [facility] is aware and will collect the pts information from the chart. [facility] will deliver pts TPN this evening. [facility] is also to call pt to followup copay for hospital bed and pt will let [facility] when to deliver pts hospital bed. No other CM needs noted.  12/28/14 1310 Arlyss Queen, RN BSN CM Pt now not transferring to [facility]. Will continue to follow for discharge planning needs.  12/27/14  1520 Arlyss Queen, RN BSN CM GI MD working on transfer to [facility]. Will continue to follow for discharge planning needs.  12/21/14 1420 Arlyss Queen, RN BSN CM Pt now running fevers. Wound, urine, and blood cultures obtained. Pt now on IV AB. Will continue to follow.  12/17/14 1420 Arlyss Queen, RN BSN CM

## 2014-12-17 NOTE — Progress Notes (Addendum)
Nutrition Follow-up  INTERVENTION: -TPN per pharmacy  NUTRITION DIAGNOSIS: Inadequate oral intake related to altered GI function as evidenced by NPO/Clear liquids (237 ml per shift).   Goal: Pt will meet >90% of estimated nutritional needs; progressing via TPN  Monitor:  TPN tolerance, transition to PO diet/enteral feedings (if feasible), labs, weight changes, I/O's  Reason for Assessment: MST=5, TPN   ASSESSMENT:   David [name] is a 39 y.o. male with a history of Crohn disease with fistula, Stage III fatty liver fibrosis and chronic liver disease, anemia, hypertension, failure to thrive, on chronic steroids. Patient underwent exploratory laparotomy at [facility] on 11/05/2014 with plans of lysis adhesions and possible bowel resection, but surgery was never able to completely enter the abdomen due to frozen abdomen. Patient has a gastrostomy tube placed percutaneously. Over the past several weeks, the patient's abdominal pain has been increasing.   Pt re-admitted with aspiration pneumonia and anasarca. c/o edema to bilateral  Extremities including testicles. Significant weight gain noted 6.9 kg < 7 days. Weight hx variable.  TPN Clinimix 5/15 @ 80 ml/hr to start today at 1800 and lipids daily. Which will provide: 1843 kcal, 96 gr protein and 2160 ml. Meets 88% energy and 100% protein goal.   Labs: sodium 129 low, Calcium 7.4 , glucose 199 high.  Height: Ht Readings from Last 1 Encounters:  12/15/14 5\' 11"  (1.803 m)    Weight: Wt Readings from Last 1 Encounters:  12/16/14 189 lb 2.5 oz (85.8 kg)    Ideal Body Weight: 172#  Wt Readings from Last 10 Encounters:  12/16/14 189 lb 2.5 oz (85.8 kg)  12/13/14 174 lb (78.926 kg)  11/27/14 163 lb 4.8 oz (74.072 kg)  10/15/14 195 lb (88.451 kg)  09/26/14 211 lb 8 oz (95.936 kg)  08/21/14 193 lb 6.4 oz (87.726 kg)  06/18/14 193 lb 6.4 oz (87.726 kg)  05/14/14 214 lb 3.2 oz (97.16 kg)  03/09/14 229 lb (103.874 kg)   03/06/14 230 lb 3.2 oz (104.418 kg)    Usual Body Weight: 220#  BMI:  Body mass index is 26.39 kg/(m^2). Normal weight range  Estimated Nutritional Needs: Kcal: 2100-2300 Protein: 96-106 grams Fluid: 2.1-2.3 L  Skin: surgical incision to mid abdomen, PEG  Diet Order: TPN (CLINIMIX-E) Adult TPN (CLINIMIX-E) Adult Diet clear liquid  EDUCATION NEEDS: -Education not appropriate at this time   Intake/Output Summary (Last 24 hours) at 12/17/14 1434 Last data filed at 12/17/14 1306  Gross per 24 hour  Intake 1303.84 ml  Output    850 ml  Net 453.84 ml    Last BM: 1/2 diarrhea  Labs:   Recent Labs Lab 12/12/14 0315  12/13/14 0440 12/14/14 0551 12/15/14 1409 12/16/14 0503 12/17/14 0548  NA 123*  < > 129* 132* 131* 132* 129*  K 3.8  < > 3.2* 3.1* 3.1* 5.0 3.7  CL 93*  < > 97 103 102 105 101  CO2 26  < > 27 25 23 24 23   BUN 29*  < > 21 17 20 21 17   CREATININE 0.76  < > 0.61 0.60 0.68 0.66 0.61  CALCIUM 7.1*  < > 7.4* 7.4* 6.9* 7.1* 7.4*  MG 2.0  --  1.7  --   --   --  1.5  PHOS 2.3  --  1.9* 2.2*  --   --  3.4  GLUCOSE 129*  < > 119* 132* 110* 122* 199*  < > = values in this interval not displayed.  CBG (last 3)   Recent Labs  12/14/14 1740 12/17/14 1126  GLUCAP 140* 143*    Scheduled Meds: . acidophilus  1 capsule Oral Daily  . furosemide  40 mg Oral Daily  . hydrocortisone sod succinate (SOLU-CORTEF) inj  50 mg Intravenous Q8H  . insulin aspart  0-20 Units Subcutaneous 4 times per day  . metoCLOPramide (REGLAN) injection  5 mg Intravenous 3 times per day  . oxyCODONE  20 mg Oral QID  . pantoprazole (PROTONIX) IV  40 mg Intravenous Q12H  . piperacillin-tazobactam (ZOSYN)  IV  3.375 g Intravenous Q8H  . sodium chloride  10 mL Intravenous Q12H  . sodium chloride  3 mL Intravenous Q12H  . sucralfate  1 g Oral TID AC    Continuous Infusions: . Marland KitchenTPN (CLINIMIX-E) Adult     And  . fat emulsion    . Marland KitchenTPN (CLINIMIX-E) Adult 80 mL/hr at 12/16/14 1804    And  . fat emulsion 250 mL (12/16/14 1805)    Past Medical History  Diagnosis Date  . 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 Hepatomegaly 05/04/2011  . Fatty liver 05/04/2011    "stage III fatty liver fibrosis" (11/15/2012)  . GERD (gastroesophageal reflux disease)   . Chronic liver disease     /notes 11/15/2012  . Pericarditis     [name] 11/15/2012  . Hypertension   . Pneumonia 1977  . Shortness of breath     "all the time right now" (11/15/2012)  . History of blood transfusion 2004  . History of stomach ulcers   . Duodenal ulcer   . Depression   . Kidney stones     bilaterally/notes 11/15/2012  . Hepatic fibrosis     [name] 11/15/2012  . Anemia   . Pericardial effusion 10/29/2012    moderate to large/notes 11/15/2012  . Anxiety   . Hepatitis   . Crohn's disease   . ED (erectile dysfunction)   . 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 Anal examination under anesthesia  02/11/2011    [name]  . Treatment fistula anal  07/03/11    This was a second surgery to repair Anal fistula  . Gastrojejunostomy  2004    "had hole cut in small intestines during endoscopy; had to have OR & leave me open 3 months" (11/15/2012)  . Cholecystectomy  ~ 2003  . Appendectomy  ~ 2003  . Video assisted thoracoscopy  11/17/2012    Procedure: VIDEO ASSISTED THORACOSCOPY;  Surgeon: Loreli Slot, MD;  Location[facility] OR;  Service: Thoracic;  Laterality: Left;  drainage of left pleural effusion  . Pericardial window  11/17/2012    Procedure: PERICARDIAL WINDOW;  Surgeon: Loreli Slot, MD;  Location: [facility] OR;  Service: Thoracic;  Laterality: N/A;  . Esophagogastroduodenoscopy  01/06/2013    Procedure: ESOPHAGOGASTRODUODENOSCOPY (EGD);  Surgeon: Malissa Hippo, MD;  Location[facility];  Service: Endoscopy;  Laterality: N/A;  11:15  .  Esophagogastroduodenoscopy N/A 03/09/2014    Dr. Karilyn Cota: erosive/ulcerative reflux esophagiits, portal gastropathy, moderate amount of bile and food debris in stomach precluding visualization of GJ anastomosis  . Gastrostomy tube placement  11/19/14    [facility]: 31 F APDL catheter. Checked again 12/23 via fluoroscopy and in suitable position  . Colonoscopy  Aug 2015    Dr. Edyth Gunnels  at [facility]: congested mucosa in entire colon, solitary ulcer distal ileum, stricture in ileum 5 cm from IC valve s/p dilation. Surveillance in 1 year  . Exploratory laparotomy  Nov 05, 2014    [facility], Dr. Byrd Hesselbach. Several hour operation with unsuccessful lysis of adhesions due to frozen abdomen    Royann Shivers MS,RD,CSG,LDN Office: [phone] Pager: [phone]

## 2014-12-17 NOTE — Progress Notes (Signed)
[facility] PROGRESS NOTE  David [name] QMV:[id number] DOB: 1976-05-28 DOA: 12/15/2014 PCP: [name], MD  Assessment/Plan: 1. Aspiration pneumonia. Other than sips of liquids, patient should be nothing by mouth due to multiple bowel issues. The patient had recently discharged and ate solid food at home which likely led to aspiration. He was started on Zosyn. Clinically he appears to be improving. We'll continue current treatments. 2. End-stage Crohn's disease. Patient is continued on TPN for supportive nutrition. He is only on sips of liquids with medications. Continue on PPI, Carafate and low-dose Reglan. Appreciate GI assistance. He has a gastrostomy tube in place for decompression. May need transfer to [facility] to expedite his transplant evaluation. Dr. Karilyn Cota will return on 1/6 to further evaluate the patient for this. 3. Cirrhosis of the liver, secondary to methotrexate therapy in the past. 4. Anemia, likely combination of iron deficiency as well as some element of chronic disease. He received intravenous iron during his last admission. He also received 4 units of PRBCs during that last admission. No obvious bleeding. We'll continue to monitor hemoglobin. Transfuse for hemoglobin less than 7. 5. Chronic steroid therapy for Crohn's disease. Currently on stress dose steroids with Solu-Cortef. He does not appear septic or toxic. Weaning steroids. 6. Anasarca. Likely related to hypoalbuminemia from liver disease. Patient was started on home dose of oral Lasix, but feels that his edema is worsening. We'll transition to intravenous Lasix and monitor intake and output. 7. Chronic pain syndrome. Continued on home dose of oxycodone.  Code Status: full code Family Communication: discussed with patient Disposition Plan: discharge home once improved   Consultants:  gastroenterology  Procedures:    Antibiotics:  Zosyn 12/15/14>  HPI/Subjective: No shortness of breath today.  Mild cough. Feels that his peripheral edema is starting to worsen. Reports worsening of scrotal edema overnight.  Objective: Filed Vitals:   12/17/14 1420  BP: 106/65  Pulse: 79  Temp: 98 F (36.7 C)  Resp: 20    Intake/Output Summary (Last 24 hours) at 12/17/14 1856 Last data filed at 12/17/14 1839  Gross per 24 hour  Intake   2585 ml  Output   1500 ml  Net   1085 ml   Filed Weights   12/15/14 1215 12/15/14 1817 12/16/14 0500  Weight: 78.926 kg (174 lb) 82.2 kg (181 lb 3.5 oz) 85.8 kg (189 lb 2.5 oz)    Exam:   General:  NAD  Cardiovascular: S1, S2 RRR  Respiratory: crackles at right base, diminished sounds at left base  Abdomen: distended, g tube in place, bs+, soft, diffuse tenderness  Musculoskeletal: 2+ pitting edema lower extremities bilaterally and scrotal edema  Data Reviewed: Basic Metabolic Panel:  Recent Labs Lab 12/11/14 0450 12/12/14 0315  12/13/14 0440 12/14/14 0551 12/15/14 1409 12/16/14 0503 12/17/14 0548  NA 126* 123*  < > 129* 132* 131* 132* 129*  K 3.8 3.8  < > 3.2* 3.1* 3.1* 5.0 3.7  CL 94* 93*  < > 97 103 102 105 101  CO2 26 26  < > GLUCOSE 139* 129*  < > 119* 132* 110* 122* 199*  BUN 44* 29*  < > CREATININE 0.97 0.76  < > 0.61 0.60 0.68 0.66 0.61  CALCIUM 7.3* 7.1*  < > 7.4* 7.4* 6.9* 7.1* 7.4*  MG 2.1 2.0  --  1.7  --   --   --  1.5  PHOS 3.0 2.3  --  1.9* 2.2*  --   --  3.4  < > = values in this interval not displayed. Liver Function Tests:  Recent Labs Lab 12/13/14 0440 12/14/14 0551 12/15/14 1409 12/16/14 0503 12/17/14 0548  AST 30 29 34 26 20  ALT 29 31 36 30 27  ALKPHOS 91 92 103 93 83  BILITOT 0.7 0.8 0.8 0.8 0.6  PROT 4.6* 4.5* 4.6* 4.2* 4.3*  ALBUMIN 2.3* 2.2* 2.2* 2.0* 2.1*   No results for input(s): LIPASE, AMYLASE in the last 168 hours. No results for input(s): AMMONIA in the last 168 hours. CBC:  Recent Labs Lab 12/12/14 0315 12/13/14 1027 12/14/14 0551 12/15/14 1409  12/16/14 0503 12/16/14 1115 12/17/14 0548  WBC 4.8 7.2 8.1 17.8* 9.2 9.0 8.6  NEUTROABS 3.6 5.5 6.3 16.0*  --   --  7.9*  HGB 7.7* 8.4* 8.0* 8.7* 7.5* 7.6* 7.7*  HCT 23.8* 25.9* 25.0* 27.1* 23.7* 23.7* 24.4*  MCV 80.4 81.2 81.4 82.4 82.9 82.9 84.1  PLT 70* 88* 69* 86* 85* 71* 84*   Cardiac Enzymes:  Recent Labs Lab 12/15/14 1409  TROPONINI <0.03   BNP (last 3 results) No results for input(s): PROBNP in the last 8760 hours. CBG:  Recent Labs Lab 12/14/14 0655 12/14/14 1124 12/14/14 1740 12/17/14 1126 12/17/14 1805  GLUCAP 124* 191* 140* 143* 137*    Recent Results (from the past 240 hour(s))  Blood culture (routine x 2)     Status: None   Collection Time: 12/09/14  5:36 PM  Result Value Ref Range Status   Specimen Description BLOOD RIGHT ANTECUBITAL  Final   Special Requests BOTTLES DRAWN AEROBIC AND ANAEROBIC 10CC  Final   Culture NO GROWTH 6 DAYS  Final   Report Status 12/15/2014 FINAL  Final  Blood culture (routine x 2)     Status: None   Collection Time: 12/09/14  5:45 PM  Result Value Ref Range Status   Specimen Description BLOOD LEFT HAND  Final   Special Requests BOTTLES DRAWN AEROBIC ONLY 5CC  Final   Culture NO GROWTH 6 DAYS  Final   Report Status 12/15/2014 FINAL  Final  Blood culture (routine x 2)     Status: None (Preliminary result)   Collection Time: 12/15/14  2:09 PM  Result Value Ref Range Status   Specimen Description PORTA CATH  Final   Special Requests BOTTLES DRAWN AEROBIC ONLY  Final   Culture NO GROWTH 2 DAYS  Final   Report Status PENDING  Incomplete  Blood culture (routine x 2)     Status: None (Preliminary result)   Collection Time: 12/15/14  2:15 PM  Result Value Ref Range Status   Specimen Description LEFT ANTECUBITAL  Final   Special Requests BOTTLES DRAWN AEROBIC ONLY  Final   Culture NO GROWTH 2 DAYS  Final   Report Status PENDING  Incomplete  MRSA PCR Screening     Status: None   Collection Time: 12/15/14  6:00 PM  Result  Value Ref Range Status   MRSA by PCR NEGATIVE NEGATIVE Final    Comment:        The GeneXpert MRSA Assay (FDA approved for NASAL specimens only), is one component of a comprehensive MRSA colonization surveillance program. It is not intended to diagnose MRSA infection nor to guide or monitor treatment for MRSA infections.   Culture, respiratory (NON-Expectorated)     Status: None (Preliminary result)   Collection Time: 12/15/14  8:00 PM  Result Value Ref Range Status  Specimen Description SPUTUM  Final   Special Requests NONE  Final   Gram Stain   Final    ABUNDANT WBC PRESENT, PREDOMINANTLY PMN NO SQUAMOUS EPITHELIAL CELLS SEEN MODERATE YEAST FEW GRAM NEGATIVE RODS FEW GRAM POSITIVE RODS    Culture   Final    Culture reincubated for better growth Performed at [facility]    Report Status PENDING  Incomplete     Studies: US Abdomen Limited  12/16/2014   CLINICAL DATA:  Evaluate for ascites.  EXAM: LIMITED ABDOMEN ULTRASOUND FOR ASCITES  TECHNIQUE: Limited ultrasound survey for ascites was performed in all four abdominal quadrants.  COMPARISON:  12/09/2014 CT  FINDINGS: Focused ultrasound of the abdomen demonstrates no evidence of ascites.  IMPRESSION: No significant ascites.   Electronically Signed   By: Jeronimo Greaves M.D.   On: 12/16/2014 10:03    Scheduled Meds: . acidophilus  1 capsule Oral Daily  . furosemide  40 mg Intravenous BID  . [START ON 12/18/2014] hydrocortisone sod succinate (SOLU-CORTEF) inj  50 mg Intravenous Q12H  . insulin aspart  0-20 Units Subcutaneous 4 times per day  . metoCLOPramide (REGLAN) injection  5 mg Intravenous 3 times per day  . oxyCODONE  20 mg Oral QID  . pantoprazole (PROTONIX) IV  40 mg Intravenous Q12H  . piperacillin-tazobactam (ZOSYN)  IV  3.375 g Intravenous Q8H  . potassium chloride  10 mEq Intravenous Q1 Hr x 3  . sodium chloride  10 mL Intravenous Q12H  . sodium chloride  3 mL Intravenous Q12H  . sucralfate  1 g Oral TID  AC   Continuous Infusions: . Marland KitchenTPN (CLINIMIX-E) Adult 80 mL/hr at 12/17/14 1754   And  . fat emulsion 240 mL (12/17/14 1754)    Principal Problem:   Aspiration pneumonia Active Problems:   Crohn's disease of both small and large intestine with fistula   Chronic right lower quadrant pain   Hypertension   Liver cirrhosis   Gastroparesis   Aspiration into airway   Anasarca    Time spent:    [facility]  [facility] Pager [phone]. If 7PM-7AM, please contact night-coverage at [email or web address], password [facility] 12/17/2014, 6:56 PM  LOS: 2 days

## 2014-12-17 NOTE — Progress Notes (Signed)
Pt was seen for initiation of a PT evaluation.  He reports that he has been up ad lib, able to ambulate in the room and into the hallway with no difficulty.  He states that now that he is feeling better he no longer feels weak.  Please reconsult if situation changes.

## 2014-12-17 NOTE — Telephone Encounter (Signed)
Patient admitted again. Will see on 12-19-2014 if still in the hospital.

## 2014-12-17 NOTE — Evaluation (Signed)
Occupational Therapy Evaluation Patient Details Name: David [name] MRN: [id number] DOB: 26-Oct-1976 Today's Date: 12/17/2014    History of Present Illness Unfortunate 39 y/o ? Who I just discharged 12/14/14 after a 4 to five-day hospitalization for acute abdominal pain probably secondary to frozen abdomen, anemia thought secondary to acute disease as well as iron deficiency , potential chronic aspiration , history Crohn's disease diagnosed 2002 with symptoms as early as age 81 status post gastrojejunostomy 2004 with a complicated abdominal wound and mesh, cirrhosis secondary to [place] versus methotrexate, ?last meld score 9 to 10 , EGD 10/2013 = grade 1 varices, recent attempt 11/23 exploratory laparoscopy, dysplastic hepatic nodule,severe protein energy malnutrition , moderate hyponatremia+ hypokalemia ,  during the last admission patient was treated for his chronic abdominal pain, his anemia which was treated with 4 units of packed red blood cells and was sent home in a relatively stable state with thought that he had probable chronic aspiration He went home last night at around 8 p.m. and decided to have half grilled cheese sandwich and some crackers and felt tired. He fell asleep and awoke choking, he felt weak and could not move he is normally ambulatory His wife states that he was not able to get out of bed he was short of breath on normal movement and he had a fast heartbeat and patient was brought back to the emergency room as it was felt that he had clinically aspirated.     Clinical Impression   Pt is presenting to acute OT with above situation.  He reports prior independence with all ADL needs and does not verbalize or demonstrate any concerns about returning to independence upon d/c.  Pt reports feelign at about 90% strength, and demonstrates WFL strength, ROM, coordination, and sensation in BUE. Pt verbalizes hope to transfer to [facility] for possibility to liver and small intestine transplant  due to complications from Crohn's.  Pt needs no further acute OT needs at this time, but recommend OT follow-up after transplant when it takes place. Acute OT to sign off at this time.    Follow Up Recommendations  No OT follow up    Equipment Recommendations  None recommended by OT (OTR asked pt about shower seat - pt reprots he does not need at this time.)    Recommendations for Other Services       Precautions / Restrictions Precautions Precautions: None Restrictions Weight Bearing Restrictions: No      Mobility Bed Mobility Overal bed mobility: Independent                Transfers                      Balance                                            ADL Overall ADL's : At baseline;Independent                                             Vision                     Perception     Praxis      Pertinent Vitals/Pain Pain Assessment: 0-10 Pain Score:  8  Pain Location: abdomen Pain Intervention(s): Monitored during session;Patient requesting pain meds-RN notified     Hand Dominance Right   Extremity/Trunk Assessment Upper Extremity Assessment Upper Extremity Assessment: Overall WFL for tasks assessed           Communication Communication Communication: No difficulties   Cognition Arousal/Alertness: Awake/alert Behavior During Therapy: WFL for tasks assessed/performed Overall Cognitive Status: Within Functional Limits for tasks assessed                     General Comments       Exercises       Shoulder Instructions      Home Living Family/patient expects to be discharged to:: Private residence Living Arrangements: Spouse/significant other;Children Available Help at Discharge: Family (mother lives nearby, wife and children able to assist) Type of Home: House Home Access: Stairs to enter Secretary/administrator of Steps: 3 Entrance Stairs-Rails: Right;Left;Can reach  both Home Layout: One level     Bathroom Shower/Tub: Walk-in shower;Tub/shower unit   Bathroom Toilet: Handicapped height (and standard)     Home Equipment: Grab bars - toilet;Cane - single point          Prior Functioning/Environment Level of Independence: Independent        Comments: Only uses cane on days of very incrased fatigue.  Pt reports indepednence with all ADL needs    OT Diagnosis:     OT Problem List:     OT Treatment/Interventions:      OT Goals(Current goals can be found in the care plan section) Acute Rehab OT Goals Patient Stated Goal: No OT goals needed OT Goal Formulation: With patient Time For Goal Achievement: 12/31/14 Potential to Achieve Goals: Good  OT Frequency:     Barriers to D/C:            Co-evaluation              End of Session Equipment Utilized During Treatment: Gait belt  Activity Tolerance: Patient tolerated treatment well Patient left: in bed;with nursing/sitter in room;with call bell/phone within reach;with family/visitor present   Time: 1610-9604 OT Time Calculation (min): 11 min Charges:  OT General Charges $OT Visit: 1 Procedure OT Evaluation $Initial OT Evaluation Tier I: 1 Procedure G-Codes:     Marry Guan David Midkiff, MS, OTR/L [facility] [phone] 12/17/2014, 9:00 AM

## 2014-12-17 NOTE — Progress Notes (Signed)
UR chart review completed.  

## 2014-12-18 LAB — GLUCOSE, CAPILLARY
GLUCOSE-CAPILLARY: 157 mg/dL — AB (ref 70–99)
Glucose-Capillary: 126 mg/dL — ABNORMAL HIGH (ref 70–99)
Glucose-Capillary: 132 mg/dL — ABNORMAL HIGH (ref 70–99)
Glucose-Capillary: 145 mg/dL — ABNORMAL HIGH (ref 70–99)
Glucose-Capillary: 111 mg/dL — ABNORMAL HIGH (ref 70–99)

## 2014-12-18 LAB — CBC
HCT: 25.5 % — ABNORMAL LOW (ref 39.0–52.0)
Hemoglobin: 8.1 g/dL — ABNORMAL LOW (ref 13.0–17.0)
MCH: 26.9 pg (ref 26.0–34.0)
MCHC: 31.8 g/dL (ref 30.0–36.0)
MCV: 84.7 fL (ref 78.0–100.0)
Platelets: 88 10*3/uL — ABNORMAL LOW (ref 150–400)
RBC: 3.01 MIL/uL — ABNORMAL LOW (ref 4.22–5.81)
RDW: 20.3 % — AB (ref 11.5–15.5)
WBC: 6.9 10*3/uL (ref 4.0–10.5)

## 2014-12-18 LAB — COMPREHENSIVE METABOLIC PANEL
ALK PHOS: 97 U/L (ref 39–117)
ALT: 39 U/L (ref 0–53)
AST: 40 U/L — ABNORMAL HIGH (ref 0–37)
Albumin: 2.3 g/dL — ABNORMAL LOW (ref 3.5–5.2)
Anion gap: 5 (ref 5–15)
BILIRUBIN TOTAL: 0.7 mg/dL (ref 0.3–1.2)
BUN: 17 mg/dL (ref 6–23)
CHLORIDE: 101 meq/L (ref 96–112)
CO2: 25 mmol/L (ref 19–32)
Calcium: 7.2 mg/dL — ABNORMAL LOW (ref 8.4–10.5)
Creatinine, Ser: 0.64 mg/dL (ref 0.50–1.35)
GFR calc non Af Amer: >90 mL/min (ref >90)
GLUCOSE: 125 mg/dL — AB (ref 70–99)
Potassium: 3.2 mmol/L — ABNORMAL LOW (ref 3.5–5.1)
Sodium: 131 mmol/L — ABNORMAL LOW (ref 135–145)
TOTAL PROTEIN: 4.4 g/dL — AB (ref 6.0–8.3)

## 2014-12-18 LAB — CULTURE, RESPIRATORY: Culture: NORMAL

## 2014-12-18 LAB — CULTURE, RESPIRATORY W GRAM STAIN

## 2014-12-18 MED ORDER — FAT EMULSION 20 % IV EMUL
240.0000 mL | INTRAVENOUS | Status: AC
  Administered 2014-12-18: 240 mL via INTRAVENOUS
  Filled 2014-12-18: qty 250

## 2014-12-18 MED ORDER — POTASSIUM CHLORIDE CRYS ER 20 MEQ PO TBCR
40.0000 meq | EXTENDED_RELEASE_TABLET | ORAL | Status: AC
  Administered 2014-12-18 (×2): 40 meq via ORAL
  Filled 2014-12-18 (×2): qty 2

## 2014-12-18 MED ORDER — TRACE MINERALS CR-CU-F-FE-I-MN-MO-SE-ZN IV SOLN
INTRAVENOUS | Status: AC
  Administered 2014-12-18: 17:00:00 via INTRAVENOUS
  Filled 2014-12-18: qty 1920

## 2014-12-18 MED ORDER — AMOXICILLIN-POT CLAVULANATE 400-57 MG/5ML PO SUSR
800.0000 mg | Freq: Two times a day (BID) | ORAL | Status: DC
  Administered 2014-12-18: 800 mg via ORAL
  Filled 2014-12-18 (×2): qty 10

## 2014-12-18 MED ORDER — POTASSIUM CHLORIDE 10 MEQ/100ML IV SOLN
10.0000 meq | INTRAVENOUS | Status: AC
  Administered 2014-12-18 (×4): 10 meq via INTRAVENOUS
  Filled 2014-12-18 (×4): qty 100

## 2014-12-18 NOTE — Progress Notes (Signed)
    Subjective: States his sweeling is not getting any worse and seems to be slowly improving. Abdominal pain at baseline. No N/V. Had a bowel movement this morning. GTube is draining well. Breathing is "much better" as is his energy/weakness. Ambulates in the room independently. Abdominal distension at baseline.  Objective: Vital signs in last 24 hours: Temp:  [98 F (36.7 C)-98.3 F (36.8 C)] 98.3 F (36.8 C) (01/05 0621) Pulse Rate:  [79-87] 79 (01/05 0621) Resp:  [20] 20 (01/05 0621) BP: (96-115)/(61-72) 96/61 mmHg (01/05 0621) SpO2:  [100 %] 100 % (01/05 0621) Last BM Date: 12/15/14 General:   Alert and oriented, pleasant. Cachetic appearance in NAD Head:  Normocephalic and atraumatic. Eyes:  No icterus, sclera clear. Conjuctiva pink.  Heart:  S1, S2 present, no murmurs noted.  Lungs: Clear to auscultation bilaterally, without wheezing, rales, or rhonchi.  Abdomen:  Bowel sounds present, slightly distended, non-tender. No HSM or hernias noted. No rebound or guarding. No masses appreciated  Msk:  Symmetrical without gross deformities. Normal posture. Pulses:  Normal DP pulses noted. Extremities:  Without clubbing or edema. Neurologic:  Alert and  oriented x4;  grossly normal neurologically. Skin:  Warm and dry, intact without significant lesions. Midline incision noted to abdomen as well as GTube for drainage not currently hooked to drainage bad. Psych:  Alert and cooperative. Normal mood and affect.  Intake/Output from previous day: 01/04 0701 - 01/05 0700 In: 1500 [P.O.:120; IV Piggyback:300; TPN:1080] Out: 2200 [Urine:2200] Intake/Output this shift:    Lab Results:  Recent Labs  12/16/14 1115 12/17/14 0548 12/18/14 0546  WBC 9.0 8.6 6.9  HGB 7.6* 7.7* 8.1*  HCT 23.7* 24.4* 25.5*  PLT 71* 84* 88*   BMET  Recent Labs  12/16/14 0503 12/17/14 0548 12/18/14 0546  NA 132* 129* 131*  K 5.0 3.7 3.2*  CL 105 101 101  CO2 24 23 25   GLUCOSE 122* 199* 125*  BUN  21 17 17   CREATININE 0.66 0.61 0.64  CALCIUM 7.1* 7.4* 7.2*   LFT  Recent Labs  12/16/14 0503 12/17/14 0548 12/18/14 0546  PROT 4.2* 4.3* 4.4*  ALBUMIN 2.0* 2.1* 2.3*  AST 26 20 40*  ALT 30 27 39  ALKPHOS 93 83 97  BILITOT 0.8 0.6 0.7   PT/INR  Recent Labs  12/15/14 1409 12/16/14 0503  LABPROT 17.7* 17.8*  INR 1.44 1.46    Studies/Results: No results found.  Assessment:  39 y/o male with end-stage Crohn's disease and cirrhosis readmitted with aspiration pneumonia and anasarca. He has nonfunctioning GI tract. G-tube in place for venting only. On TPN. Has appt on 12/21/14 for consideration of dual small bowel and liver transplantation. Slowly improving edema and respiratory status. Abdominal symptoms near or at baseline. H/H is stable, slightly increased this morning.   Plan:  1. Continue PPI BID, Carafate, low-dose Reglan.  2. Sips of liquids for pleasure only, otherwise nothing by mouth. 8 ounces per shift. 3. Continue TPN. 4. Continue diuretic therapy per attending.  5. Continue to monitor H/H, repeat CBC tomorrow morning. 6. Dr. Karilyn Cota to return Wednesday. Continue support here at [facility] for now as patient has stabilized.  7. Keep appointment for Friday at [facility] for evaluation of colon/liver transplant.  Wynne Dust, AGNP-C Adult & Gerontological Nurse Practitioner [facility]    LOS: 3 days    12/18/2014, 10:36 AM

## 2014-12-18 NOTE — Progress Notes (Signed)
PARENTERAL NUTRITION CONSULT NOTE  Pharmacy Consult for TPN Indication: prolonged ileus  Allergies  Allergen Reactions  . Remicade [Infliximab]     Dr Karilyn Cota states previous respiratory arrest not related to remicade. Dr Karilyn Cota states remicade not a drug related allergy. 07-07-2013 at 1025 rapid response called to PACU- Patient difficulty breathing after infusion of Remicade infusing. Do NOT Give Remicade!  . Fentanyl And Related Itching    Swelling, Redness  . Alfentanil Itching    Swelling, Redness  . Wellbutrin [Bupropion] Nausea And Vomiting  . Morphine And Related Hives   Patient Measurements: Height: 5\' 11"  (180.3 cm) Weight: 189 lb 2.5 oz (85.8 kg) IBW/kg (Calculated) : 75.3  Usual body weight ~ 100kg  Vital Signs: Temp: 98.3 F (36.8 C) (01/05 0621) Temp Source: Oral (01/05 0621) BP: 96/61 mmHg (01/05 0621) Pulse Rate: 79 (01/05 0621) Intake/Output from previous day: 01/04 0701 - 01/05 0700 In: 1500 [P.O.:120; IV Piggyback:300; TPN:1080] Out: 2200 [Urine:2200] Intake/Output from this shift:    Labs:  Recent Labs  12/15/14 1409 12/16/14 0503 12/16/14 1115 12/17/14 0548 12/18/14 0546  WBC 17.8* 9.2 9.0 8.6 6.9  HGB 8.7* 7.5* 7.6* 7.7* 8.1*  HCT 27.1* 23.7* 23.7* 24.4* 25.5*  PLT 86* 85* 71* 84* 88*  APTT 30  --   --   --   --   INR 1.44 1.46  --   --   --      Recent Labs  12/16/14 0503 12/17/14 0548 12/18/14 0546  NA 132* 129* 131*  K 5.0 3.7 3.2*  CL 105 101 101  CO2 24 23 25   GLUCOSE 122* 199* 125*  BUN 21 17 17   CREATININE 0.66 0.61 0.64  CALCIUM 7.1* 7.4* 7.2*  MG  --  1.5  --   PHOS  --  3.4  --   PROT 4.2* 4.3* 4.4*  ALBUMIN 2.0* 2.1* 2.3*  AST 26 20 40*  ALT 30 27 39  ALKPHOS 93 83 97  BILITOT 0.8 0.6 0.7  PREALBUMIN  --  11.7*  --   TRIG  --  61  --    Estimated Creatinine Clearance: 133.3 mL/min (by C-G formula based on Cr of 0.64).    Recent Labs  12/18/14 0008 12/18/14 0620 12/18/14 0812  GLUCAP 157* 126* 132*    Medical History: Past Medical History  Diagnosis Date  . 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 Hepatomegaly 05/04/2011  . Fatty liver 05/04/2011    "stage III fatty liver fibrosis" (11/15/2012)  . GERD (gastroesophageal reflux disease)   . Chronic liver disease     /notes 11/15/2012  . Pericarditis     [name] 11/15/2012  . Hypertension   . Pneumonia 1977  . Shortness of breath     "all the time right now" (11/15/2012)  . History of blood transfusion 2004  . History of stomach ulcers   . Duodenal ulcer   . Depression   . Kidney stones     bilaterally/notes 11/15/2012  . Hepatic fibrosis     [name] 11/15/2012  . Anemia   . Pericardial effusion 10/29/2012    moderate to large/notes 11/15/2012  . Anxiety   . Hepatitis   . Crohn's disease   . ED (erectile dysfunction)   . 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 States that he just been seen at [facility] 12/05/14 to change out his PEG tube to a larger size but was not done due to low BP.  Was sent home to be with family over holidays. Over that period of time he progressively got weaker and weaker and felt more ill or nauseous and was unable to keep anything down.  He was just discharged from [facility] 2 days ago with TPN but was readmitted due to choking and possible aspiration after eating at home.   He also has appt on Friday at [facility] to be evaluated for liver/colon transplant.   He is clinically improving.  Na remains  low, but stable despite TPN and NS IV.  Potassium is low. CBGs mostly in goal range, only 1 reading >150 overnight.  24hr I/O -700.  Estimated Nutritional Needs: Kcal: 2100-2300 Protein: 96-106 grams Fluid: 2.1-2.3 L  Plan:   Continue TPN with Clinimix E 5/15 at 1ml/hr today (start at 6pm)  Provide lipids, trace elements, and MVI with TPN  Continue sliding scale insulin with CBG checks q6hrs  Monitor lytes, triglycerides, glucose tolerance, renal fxn, and fluid status  Replete potassium with IV KCl runs x 4  David [name], David [name] 12/18/2014,11:50 AM

## 2014-12-18 NOTE — Progress Notes (Signed)
[facility] PROGRESS NOTE  LIGHT FLORIAN QZE:[id number] DOB: 24-Oct-1976 DOA: 12/15/2014 PCP: [name], MD  Assessment/Plan: 1. Aspiration pneumonia. Other than sips of liquids, patient should be nothing by mouth due to multiple bowel issues. The patient had recently discharged and ate solid food at home which likely led to aspiration. He was started on Zosyn. Clinically he appears to be improving. Transition to oral antibiotics. 2. End-stage Crohn's disease. Patient is continued on TPN for supportive nutrition. He is only on sips of liquids with medications. Continue on PPI, Carafate and low-dose Reglan. Appreciate GI assistance. He has a gastrostomy tube in place for decompression. May need transfer to [facility] to expedite his transplant evaluation. Dr. Karilyn Cota will return on 1/6 to further evaluate the patient for this. 3. Cirrhosis of the liver, secondary to methotrexate therapy in the past. 4. Anemia, likely combination of iron deficiency as well as some element of chronic disease. He received intravenous iron during his last admission. He also received 4 units of PRBCs during that last admission. No obvious bleeding. We'll continue to monitor hemoglobin. Transfuse for hemoglobin less than 7. 5. Chronic steroid therapy for Crohn's disease. Currently on stress dose steroids with Solu-Cortef. He does not appear septic or toxic. Weaning steroids. 6. Anasarca. Likely related to hypoalbuminemia from liver disease. Urine output has been good with intravenous Lasix. Still appears to be volume overloaded. Continue current treatments. 7. Chronic pain syndrome. Continued on home dose of oxycodone.  Code Status: full code Family Communication: discussed with patient Disposition Plan: discharge home once improved   Consultants:  gastroenterology  Procedures:    Antibiotics:  Zosyn 12/15/14>12/18/14  Augmentin 1/5  HPI/Subjective: Feels that scrotal edema may have mildly improved,  still has significant peripheral edema. Denies any shortness of breath or cough.  Objective: Filed Vitals:   12/18/14 1330  BP: 103/60  Pulse: 77  Temp: 98.4 F (36.9 C)  Resp: 20    Intake/Output Summary (Last 24 hours) at 12/18/14 1723 Last data filed at 12/18/14 1637  Gross per 24 hour  Intake   1200 ml  Output   2875 ml  Net  -1675 ml   Filed Weights   12/15/14 1215 12/15/14 1817 12/16/14 0500  Weight: 78.926 kg (174 lb) 82.2 kg (181 lb 3.5 oz) 85.8 kg (189 lb 2.5 oz)    Exam:   General:  NAD  Cardiovascular: S1, S2 RRR  Respiratory: crackles at right base, diminished sounds at left base  Abdomen: distended, g tube in place, bs+, soft, diffuse tenderness  Musculoskeletal: 2+ pitting edema lower extremities bilaterally and scrotal edema  Data Reviewed: Basic Metabolic Panel:  Recent Labs Lab 12/12/14 0315  12/13/14 0440 12/14/14 0551 12/15/14 1409 12/16/14 0503 12/17/14 0548 12/18/14 0546  NA 123*  < > 129* 132* 131* 132* 129* 131*  K 3.8  < > 3.2* 3.1* 3.1* 5.0 3.7 3.2*  CL 93*  < > 97 103 102 105 101 101  CO2 26  < > 27 25 23 24 23 25   GLUCOSE 129*  < > 119* 132* 110* 122* 199* 125*  BUN 29*  < > 21 17 20 21 17 17   CREATININE 0.76  < > 0.61 0.60 0.68 0.66 0.61 0.64  CALCIUM 7.1*  < > 7.4* 7.4* 6.9* 7.1* 7.4* 7.2*  MG 2.0  --  1.7  --   --   --  1.5  --   PHOS 2.3  --  1.9* 2.2*  --   --  3.4  --   < > = values in this interval not displayed. Liver Function Tests:  Recent Labs Lab 12/14/14 0551 12/15/14 1409 12/16/14 0503 12/17/14 0548 12/18/14 0546  AST 29 34 26 20 40*  ALT 31 36 30 27 39  ALKPHOS 92 103 93 83 97  BILITOT 0.8 0.8 0.8 0.6 0.7  PROT 4.5* 4.6* 4.2* 4.3* 4.4*  ALBUMIN 2.2* 2.2* 2.0* 2.1* 2.3*   No results for input(s): LIPASE, AMYLASE in the last 168 hours. No results for input(s): AMMONIA in the last 168 hours. CBC:  Recent Labs Lab 12/12/14 0315 12/13/14 1027 12/14/14 0551 12/15/14 1409 12/16/14 0503  12/16/14 1115 12/17/14 0548 12/18/14 0546  WBC 4.8 7.2 8.1 17.8* 9.2 9.0 8.6 6.9  NEUTROABS 3.6 5.5 6.3 16.0*  --   --  7.9*  --   HGB 7.7* 8.4* 8.0* 8.7* 7.5* 7.6* 7.7* 8.1*  HCT 23.8* 25.9* 25.0* 27.1* 23.7* 23.7* 24.4* 25.5*  MCV 80.4 81.2 81.4 82.4 82.9 82.9 84.1 84.7  PLT 70* 88* 69* 86* 85* 71* 84* 88*   Cardiac Enzymes:  Recent Labs Lab 12/15/14 1409  TROPONINI <0.03   BNP (last 3 results) No results for input(s): PROBNP in the last 8760 hours. CBG:  Recent Labs Lab 12/18/14 0008 12/18/14 0620 12/18/14 0812 12/18/14 1158 12/18/14 1632  GLUCAP 157* 126* 132* 145* 111*    Recent Results (from the past 240 hour(s))  Blood culture (routine x 2)     Status: None   Collection Time: 12/09/14  5:36 PM  Result Value Ref Range Status   Specimen Description BLOOD RIGHT ANTECUBITAL  Final   Special Requests BOTTLES DRAWN AEROBIC AND ANAEROBIC 10CC  Final   Culture NO GROWTH 6 DAYS  Final   Report Status 12/15/2014 FINAL  Final  Blood culture (routine x 2)     Status: None   Collection Time: 12/09/14  5:45 PM  Result Value Ref Range Status   Specimen Description BLOOD LEFT HAND  Final   Special Requests BOTTLES DRAWN AEROBIC ONLY 5CC  Final   Culture NO GROWTH 6 DAYS  Final   Report Status 12/15/2014 FINAL  Final  Blood culture (routine x 2)     Status: None (Preliminary result)   Collection Time: 12/15/14  2:09 PM  Result Value Ref Range Status   Specimen Description BLOOD PORTA CATH DRAWN BY RN  Final   Special Requests BOTTLES DRAWN AEROBIC ONLY 6CC  Final   Culture NO GROWTH 3 DAYS  Final   Report Status PENDING  Incomplete  Blood culture (routine x 2)     Status: None (Preliminary result)   Collection Time: 12/15/14  2:15 PM  Result Value Ref Range Status   Specimen Description BLOOD LEFT ANTECUBITAL  Final   Special Requests BOTTLES DRAWN AEROBIC ONLY 6CC  Final   Culture NO GROWTH 3 DAYS  Final   Report Status PENDING  Incomplete  MRSA PCR Screening      Status: None   Collection Time: 12/15/14  6:00 PM  Result Value Ref Range Status   MRSA by PCR NEGATIVE NEGATIVE Final    Comment:        The GeneXpert MRSA Assay (FDA approved for NASAL specimens only), is one component of a comprehensive MRSA colonization surveillance program. It is not intended to diagnose MRSA infection nor to guide or monitor treatment for MRSA infections.   Culture, respiratory (NON-Expectorated)     Status: None   Collection Time: 12/15/14  8:00 PM  Result Value Ref Range Status   Specimen Description SPUTUM  Final   Special Requests NONE  Final   Gram Stain   Final    ABUNDANT WBC PRESENT, PREDOMINANTLY PMN NO SQUAMOUS EPITHELIAL CELLS SEEN MODERATE YEAST FEW GRAM NEGATIVE RODS FEW GRAM POSITIVE RODS    Culture   Final    NORMAL OROPHARYNGEAL FLORA Performed at [facility]    Report Status 12/18/2014 FINAL  Final     Studies: No results found.  Scheduled Meds: . acidophilus  1 capsule Oral Daily  . furosemide  40 mg Intravenous BID  . hydrocortisone sod succinate (SOLU-CORTEF) inj  50 mg Intravenous Q12H  . insulin aspart  0-20 Units Subcutaneous 4 times per day  . metoCLOPramide (REGLAN) injection  5 mg Intravenous 3 times per day  . oxyCODONE  20 mg Oral QID  . pantoprazole (PROTONIX) IV  40 mg Intravenous Q12H  . piperacillin-tazobactam (ZOSYN)  IV  3.375 g Intravenous Q8H  . potassium chloride  40 mEq Oral Q3H  . sodium chloride  10 mL Intravenous Q12H  . sodium chloride  3 mL Intravenous Q12H  . sucralfate  1 g Oral TID AC   Continuous Infusions: . Marland KitchenTPN (CLINIMIX-E) Adult 80 mL/hr at 12/17/14 1754   And  . fat emulsion 240 mL (12/17/14 1754)  . Marland KitchenTPN (CLINIMIX-E) Adult     And  . fat emulsion      Principal Problem:   Aspiration pneumonia Active Problems:   Crohn's disease of both small and large intestine with fistula   Chronic right lower quadrant pain   Hypertension   Liver cirrhosis   Gastroparesis    Aspiration into airway   Anasarca   Anemia of chronic disease    Time spent:    [facility]  [facility] Pager [phone]. If 7PM-7AM, please contact night-coverage at [email or web address], password [facility] 12/18/2014, 5:23 PM  LOS: 3 days

## 2014-12-19 ENCOUNTER — Inpatient Hospital Stay (HOSPITAL_COMMUNITY): Payer: Medicare Other

## 2014-12-19 DIAGNOSIS — K7460 Unspecified cirrhosis of liver: Secondary | ICD-10-CM

## 2014-12-19 DIAGNOSIS — E8770 Fluid overload, unspecified: Secondary | ICD-10-CM

## 2014-12-19 DIAGNOSIS — R109 Unspecified abdominal pain: Secondary | ICD-10-CM

## 2014-12-19 DIAGNOSIS — G8929 Other chronic pain: Secondary | ICD-10-CM

## 2014-12-19 DIAGNOSIS — K740 Hepatic fibrosis: Secondary | ICD-10-CM

## 2014-12-19 DIAGNOSIS — K50813 Crohn's disease of both small and large intestine with fistula: Secondary | ICD-10-CM

## 2014-12-19 DIAGNOSIS — R188 Other ascites: Secondary | ICD-10-CM

## 2014-12-19 LAB — GLUCOSE, CAPILLARY
Glucose-Capillary: 104 mg/dL — ABNORMAL HIGH (ref 70–99)
Glucose-Capillary: 131 mg/dL — ABNORMAL HIGH (ref 70–99)
Glucose-Capillary: 161 mg/dL — ABNORMAL HIGH (ref 70–99)
Glucose-Capillary: 171 mg/dL — ABNORMAL HIGH (ref 70–99)

## 2014-12-19 LAB — CBC
HCT: 26.8 % — ABNORMAL LOW (ref 39.0–52.0)
Hemoglobin: 8.3 g/dL — ABNORMAL LOW (ref 13.0–17.0)
MCH: 26.9 pg (ref 26.0–34.0)
MCHC: 31 g/dL (ref 30.0–36.0)
MCV: 87 fL (ref 78.0–100.0)
PLATELETS: 84 10*3/uL — AB (ref 150–400)
RBC: 3.08 MIL/uL — ABNORMAL LOW (ref 4.22–5.81)
RDW: 21.1 % — AB (ref 11.5–15.5)
WBC: 6 10*3/uL (ref 4.0–10.5)

## 2014-12-19 LAB — BASIC METABOLIC PANEL
Anion gap: 3 — ABNORMAL LOW (ref 5–15)
BUN: 16 mg/dL (ref 6–23)
CHLORIDE: 102 meq/L (ref 96–112)
CO2: 28 mmol/L (ref 19–32)
CREATININE: 0.62 mg/dL (ref 0.50–1.35)
Calcium: 7.5 mg/dL — ABNORMAL LOW (ref 8.4–10.5)
Glucose, Bld: 143 mg/dL — ABNORMAL HIGH (ref 70–99)
POTASSIUM: 4.2 mmol/L (ref 3.5–5.1)
Sodium: 133 mmol/L — ABNORMAL LOW (ref 135–145)

## 2014-12-19 MED ORDER — FAT EMULSION 20 % IV EMUL: 240.0000 mL | INTRAVENOUS | Status: DC

## 2014-12-19 MED ORDER — AMOXICILLIN-POT CLAVULANATE 200-28.5 MG/5ML PO SUSR
800.0000 mg | Freq: Two times a day (BID) | ORAL | Status: DC
  Administered 2014-12-19 – 2014-12-21 (×5): 800 mg via ORAL
  Filled 2014-12-19 (×11): qty 20

## 2014-12-19 MED ORDER — OXYCODONE HCL 20 MG/ML PO CONC
30.0000 mg | Freq: Four times a day (QID) | ORAL | Status: DC
  Administered 2014-12-19 – 2014-12-20 (×5): 30 mg via ORAL
  Filled 2014-12-19 (×5): qty 2

## 2014-12-19 MED ORDER — CLINIMIX E/DEXTROSE (5/15) 5 % IV SOLN: INTRAVENOUS | Status: DC

## 2014-12-19 MED ORDER — AMOXICILLIN-POT CLAVULANATE 250-62.5 MG/5ML PO SUSR
800.0000 mg | Freq: Two times a day (BID) | ORAL | Status: DC
  Filled 2014-12-19: qty 16

## 2014-12-19 MED ORDER — FAT EMULSION 20 % IV EMUL
240.0000 mL | INTRAVENOUS | Status: AC
  Administered 2014-12-19: 240 mL via INTRAVENOUS
  Filled 2014-12-19: qty 250

## 2014-12-19 MED ORDER — HYDROCORTISONE NA SUCCINATE PF 100 MG IJ SOLR
50.0000 mg | Freq: Every day | INTRAMUSCULAR | Status: DC
  Administered 2014-12-20: 50 mg via INTRAVENOUS
  Filled 2014-12-19: qty 2

## 2014-12-19 MED ORDER — CLINIMIX E/DEXTROSE (5/15) 5 % IV SOLN
INTRAVENOUS | Status: AC
  Administered 2014-12-19: 17:00:00 via INTRAVENOUS
  Filled 2014-12-19: qty 1920

## 2014-12-19 MED ORDER — TRACE MINERALS CR-CU-F-FE-I-MN-MO-SE-ZN IV SOLN: INTRAVENOUS | Status: DC

## 2014-12-19 NOTE — Progress Notes (Signed)
Subjective:  Patient complains of constant pain which is centered in the epigastrium. Umbilical region the right lower quadrant. He states his pain is not well controlled with medications. He is considering on clear liquids. He has not had any emesis today. He had 2 bowel movements many past small amount of unformed stool. He is passing some flatus. He states lower extremity edema has not gotten worse since yesterday. Scrotal edema has resolved. He is coughing up scant amount of greenish sputum. He denies shortness of breath. He is leaving gastrostomy tube to gravity drainage during the night and is using it on when necessary basis during the daytime. He states that this maneuver does decrease his pain intensity.   Objective: Blood pressure 108/68, pulse 95, temperature 98.8 F (37.1 C), temperature source Oral, resp. rate 16, height  (1.803 m), weight 189 lb 2.5 oz (85.8 kg), SpO2 99 %. Patient appears chronically ill. He does not appear to be in acute distress. Conjunctiva is pink. Sclera is nonicteric Oropharyngeal mucosa is normal. No neck masses or thyromegaly noted. Cardiac exam with regular rhythm normal S1 and S2. No murmur or gallop noted. Lungs are clear to auscultation. Abdomen appears to be full in epigastric region. Bowel sounds are normal. On palpation it is soft with generalized tenderness which is more pronounced in epigastric and perineum likely region. Percussion note is dull. Pitting edema noted to upper and lower back. He has 2-3+ pitting edema involving both legs. Some edema also noted to thighs.  Gastrostomy output noted to be 2 L last 24 hours. Urine output 2.7 L as 24 hours.  Labs/studies Results:   Recent Labs  12/17/14 0548 12/18/14 0546 12/19/14 0719  WBC 8.6 6.9 6.0  HGB 7.7* 8.1* 8.3*  HCT 24.4* 25.5* 26.8*  PLT 84* 88* 84*    BMET   Recent Labs  12/17/14 0548 12/18/14 0546 12/19/14 0719  NA 129* 131* 133*  K 3.7 3.2* 4.2  CL 101 101  102  CO2 GLUCOSE 199* 125* 143*  BUN CREATININE 0.61 0.64 0.62  CALCIUM 7.4* 7.2* 7.5*    LFT   Recent Labs  12/17/14 0548 12/18/14 0546  PROT 4.3* 4.4*  ALBUMIN 2.1* 2.3*  AST 20 40*  ALT 27 39  ALKPHOS 83 97  BILITOT 0.6 0.7      Assessment:  #1. Crohn's disease complicated by small bowel strictures. Balloon dilation last year provided temporary relief and attempt at surgical correction in November 2015 failed because he has frozen abdomen. 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 #2. Acute on chronic abdominal pain with dependence on narcotics. Pain control not satisfactory.Marland Kitchen He needs to be nothing by mouth and we should leave gastrostomy tube to gravity drainage. #3. Aspiration pneumonia. He is improving with therapy. He has been switched to oral antibiotic therapy and I'm not sure if he is able to absorb it. #4. Fluid overload secondary to hypoalbuminemia. Patient is on TPN and albumin has come up slightly. #5. Cirrhosis secondary to methotrexate and perhaps underlying liver injury due to inflammatory bowel disease. He is tolerating TPN so far. This is not a long-term option as he is at risk for cholestasis. He is in need of liver transplant along with small bowel transplant. #6. Anemia appears to be multifactorial. Etiologies include chronic disease and iron deficiency anemia. #7. Osteoporosis.  Limited treatment options. Will discuss with Dr. Irene Limbo.  Recommendations;  Nothing by mouth except ice chips and by mouth meds. Leave gastrostomy tube to gravity drainage except when po meds given in which case it can be clamped for one hour each time. Stop sucralfate. Daily weight on standing scale. Acute abdominal series in a.m. Continue Solu-Cortef at 50 mg IV every 12 hours. Will talk with Dr. Marval Regal of [facility] if he could be transferred  for inpatient evaluation for liver and small bowel transplant rather than office visit which is scheduled for 12/21/2014.

## 2014-12-19 NOTE — Progress Notes (Signed)
PARENTERAL NUTRITION CONSULT NOTE  Pharmacy Consult for TPN Indication: prolonged ileus  Allergies  Allergen Reactions  . Remicade [Infliximab]     Dr Karilyn Cota states previous respiratory arrest not related to remicade. Dr Karilyn Cota states remicade not a drug related allergy. 07-07-2013 at 1025 rapid response called to PACU- Patient difficulty breathing after infusion of Remicade infusing. Do NOT Give Remicade!  . Fentanyl And Related Itching    Swelling, Redness  . Alfentanil Itching    Swelling, Redness  . Wellbutrin [Bupropion] Nausea And Vomiting  . Morphine And Related Hives   Patient Measurements: Height:  (180.3 cm) Weight: 189 lb 2.5 oz (85.8 kg) IBW/kg (Calculated) : 75.3  Usual body weight ~ 100kg  Vital Signs: Temp: 98.5 F (36.9 C) (01/06 0528) Temp Source: Oral (01/06 0528) BP: 110/68 mmHg (01/06 0528) Pulse Rate: 68 (01/06 0528) Intake/Output from previous day: 01/05 0701 - 01/06 0700 In: 2568 [IV Piggyback:400; WUJ:8119] Out: 4700 [Urine:2700; Drains:2000] Intake/Output from this shift: Total I/O In: 120 [P.O.:120] Out: -   Labs:  Recent Labs  12/17/14 0548 12/18/14 0546 12/19/14 0719  WBC 8.6 6.9 6.0  HGB 7.7* 8.1* 8.3*  HCT 24.4* 25.5* 26.8*  PLT 84* 88* 84*     Recent Labs  12/17/14 0548 12/18/14 0546 12/19/14 0719  NA 129* 131* 133*  K 3.7 3.2* 4.2  CL 101 101 102  CO2 GLUCOSE 199* 125* 143*  BUN CREATININE 0.61 0.64 0.62  CALCIUM 7.4* 7.2* 7.5*  MG 1.5  --   --   PHOS 3.4  --   --   PROT 4.3* 4.4*  --   ALBUMIN 2.1* 2.3*  --   AST 20 40*  --   ALT 27 39  --   ALKPHOS 83 97  --   BILITOT 0.6 0.7  --   PREALBUMIN 11.7*  --   --   TRIG 61  --   --    Estimated Creatinine Clearance: 133.3 mL/min (by C-G formula based on Cr of 0.62).    Recent Labs  12/19/14 0019 12/19/14 0526 12/19/14 1122  GLUCAP 161* 131* 171*   Medical History: Past Medical History  Diagnosis Date  . Fistula, anal  05/04/2011  . 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 Hepatomegaly 05/04/2011  . Fatty liver 05/04/2011    "stage III fatty liver fibrosis" (11/15/2012)  . GERD (gastroesophageal reflux disease)   . Chronic liver disease     /notes 11/15/2012  . Pericarditis     [name] 11/15/2012  . Hypertension   . Pneumonia 1977  . Shortness of breath     "all the time right now" (11/15/2012)  . History of blood transfusion 2004  . History of stomach ulcers   . Duodenal ulcer   . Depression   . Kidney stones     bilaterally/notes 11/15/2012  . Hepatic fibrosis     [name] 11/15/2012  . Anemia   . Pericardial effusion 10/29/2012    moderate to large/notes 11/15/2012  . Anxiety   . Hepatitis   . Crohn's disease   . ED (erectile dysfunction)   . 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 hx crohns diag ~2002, symptoms since age 45 s/p Gastrojejunostomy 2004  complicated open abd + Mesh, Cirrhosis 2/2 to MTX use? 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 States that he just been seen at [facility] 12/05/14 to change out his PEG tube to a larger size but was not done due to low BP.  Was sent home to be with family over holidays. Over that period of time he progressively got weaker and weaker and felt more ill or nauseous and was unable to keep anything down.  He was just discharged from [facility] 2 days ago with TPN but was readmitted due to choking and possible aspiration after eating at home.   He also has appt on Friday at [facility] to be evaluated for liver/colon transplant.   He is clinically improving.  Na remains low but is improving.   CBGs mostly in goal range, only 2 readings >150 since  yesterday.  24hr I/O -2132  Estimated Nutritional Needs: Kcal: 2100-2300 Protein: 96-106 grams Fluid: 2.1-2.3 L  Plan:   Continue TPN with Clinimix E 5/15 at 49ml/hr today (start at 6pm)  Provide lipids, trace elements, and MVI with TPN  Continue sliding scale insulin with CBG checks q6hrs  Monitor lytes, triglycerides, glucose tolerance, renal fxn, and fluid status  Margo Aye, Romario Tith A 12/19/2014,11:31 AM

## 2014-12-19 NOTE — Progress Notes (Signed)
PROGRESS NOTE  David [name]:[id number] DOB: 06/12/1976 DOA: 12/15/2014 PCP: [name], MD  Assessment/Plan: 1. Aspiration pneumonia. Appears clinically resolved. Sips of liquids only.  Continue oral antibiotics. 2. End-stage Crohn's disease with chronic abdominal pain managed with oral oxycodone. Continues on TPN. 3. Cirrhosis of the liver, secondary to methotrexate. 4. Anemia of chronic disease with iron deficiency, received IV iron last admission as well as 4 units packed red blood cells. No evidence of bleeding. Hemoglobin stable. 5. Thrombocytopenia stable. Secondary to underlying liver disease. 6. Chronic steroid therapy for Crohn's disease.  7. Anasarca. 8. Chronic pain syndrome.   Continue oral antibiotics. Aspiration pneumonia appears resolved.  Continue management of Crohn's disease and cirrhosis per GI. Anemia and thrombocytopenia appears stable.  He has had no recent change to his dosing of oral narcotics, plan to increase dose in effort to wean off IV.  Wean stress test steroids. No evidence of hypotension.  Code Status: full DVT prophylaxis: SCDs Family Communication: none present Disposition Plan: home  Brendia Sacks, MD  [facility]  Pager [phone] If 7PM-7AM, please contact night-coverage at [email or web address], password [email or web address] 12/19/2014, 4:41 PM  LOS: 4 days   Consultants:  Gastroenterology  Procedures:    Antibiotics:  Augmentin 1/5 >>  Zosyn 1/2 >> 1/5  HPI/Subjective: He reports decreasing lower extremity edema. He reports acute on chronic abdominal pain continues. He has been taking oxycodone 20 mg 4 times a day for a long period of time.  Objective: Filed Vitals:   12/18/14 1330 12/18/14 2233 12/19/14 0528 12/19/14 1348  BP: 103/60 106/75 110/68 108/68  Pulse: 77 89 68 95  Temp: 98.4 F (36.9 C) 98.8 F (37.1 C) 98.5 F (36.9 C) 98.8 F (37.1 C)  TempSrc: Oral Oral Oral Oral  Resp: Height:      Weight:        SpO2: 100% 100% 98% 99%    Intake/Output Summary (Last 24 hours) at 12/19/14 1641 Last data filed at 12/19/14 1200  Gross per 24 hour  Intake   2628 ml  Output   3725 ml  Net  -1097 ml     Filed Weights   12/15/14 1215 12/15/14 1817 12/16/14 0500  Weight: 78.926 kg (174 lb) 82.2 kg (181 lb 3.5 oz) 85.8 kg (189 lb 2.5 oz)    Exam:    General:  Appears calm and mildly uncomfortable Cardiovascular: RRR, no m/r/g. No LE edema. Respiratory: CTA bilaterally, no w/r/r. Normal respiratory effort. Psychiatric: grossly normal mood and affect, speech fluent and appropriate  Data Reviewed:  Urine output 2000, BM 1   Basic metabolic panel unremarkable  Hemoglobin and platelet count stable  Pertinent data: Admission Labs  Hemoglobin 8.7  Pending data:  Blood cultures no growth, 4 days  Scheduled Meds: . acidophilus  1 capsule Oral Daily  . amoxicillin-clavulanate  800 mg Oral Q12H  . furosemide  40 mg Intravenous BID  . hydrocortisone sod succinate (SOLU-CORTEF) inj  50 mg Intravenous Q12H  . insulin aspart  0-20 Units Subcutaneous 4 times per day  . metoCLOPramide (REGLAN) injection  5 mg Intravenous 3 times per day  . oxyCODONE  20 mg Oral QID  . pantoprazole (PROTONIX) IV  40 mg Intravenous Q12H  . sodium chloride  10 mL Intravenous Q12H  . sodium chloride  3 mL Intravenous Q12H  . sucralfate  1 g Oral TID AC   Continuous Infusions: . Marland KitchenTPN (CLINIMIX-E) Adult     And  .  fat emulsion    . Marland KitchenTPN (CLINIMIX-E) Adult 80 mL/hr at 12/18/14 1729   And  . 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

## 2014-12-19 NOTE — Progress Notes (Signed)
Emotional support given this evening to pt. Pt was in tears when I entered the room. Pt states that he misses his family and that he wishes he were better. I asked if he wanted to see a chaplain, and he said no. Pt consolable, but still tearful. Will continue to monitor and give support

## 2014-12-20 LAB — COMPREHENSIVE METABOLIC PANEL
ALK PHOS: 111 U/L (ref 39–117)
ALT: 67 U/L — AB (ref 0–53)
AST: 44 U/L — ABNORMAL HIGH (ref 0–37)
Albumin: 2.3 g/dL — ABNORMAL LOW (ref 3.5–5.2)
Anion gap: 4 — ABNORMAL LOW (ref 5–15)
BILIRUBIN TOTAL: 0.8 mg/dL (ref 0.3–1.2)
BUN: 17 mg/dL (ref 6–23)
CHLORIDE: 99 meq/L (ref 96–112)
CO2: 29 mmol/L (ref 19–32)
Calcium: 7.5 mg/dL — ABNORMAL LOW (ref 8.4–10.5)
Creatinine, Ser: 0.54 mg/dL (ref 0.50–1.35)
GFR calc Af Amer: >90 mL/min (ref >90)
GFR calc non Af Amer: >90 mL/min (ref >90)
GLUCOSE: 114 mg/dL — AB (ref 70–99)
POTASSIUM: 3.9 mmol/L (ref 3.5–5.1)
SODIUM: 132 mmol/L — AB (ref 135–145)
Total Protein: 4.3 g/dL — ABNORMAL LOW (ref 6.0–8.3)

## 2014-12-20 LAB — PHOSPHORUS: PHOSPHORUS: 2.6 mg/dL (ref 2.3–4.6)

## 2014-12-20 LAB — CBC
HCT: 26.5 % — ABNORMAL LOW (ref 39.0–52.0)
Hemoglobin: 8.1 g/dL — ABNORMAL LOW (ref 13.0–17.0)
MCH: 26.6 pg (ref 26.0–34.0)
MCHC: 30.6 g/dL (ref 30.0–36.0)
MCV: 87.2 fL (ref 78.0–100.0)
PLATELETS: 75 10*3/uL — AB (ref 150–400)
RBC: 3.04 MIL/uL — AB (ref 4.22–5.81)
RDW: 21.2 % — ABNORMAL HIGH (ref 11.5–15.5)
WBC: 6.3 10*3/uL (ref 4.0–10.5)

## 2014-12-20 LAB — GLUCOSE, CAPILLARY
GLUCOSE-CAPILLARY: 172 mg/dL — AB (ref 70–99)
Glucose-Capillary: 119 mg/dL — ABNORMAL HIGH (ref 70–99)
Glucose-Capillary: 136 mg/dL — ABNORMAL HIGH (ref 70–99)
Glucose-Capillary: 150 mg/dL — ABNORMAL HIGH (ref 70–99)

## 2014-12-20 LAB — CULTURE, BLOOD (ROUTINE X 2)
CULTURE: NO GROWTH
Culture: NO GROWTH

## 2014-12-20 LAB — MAGNESIUM: MAGNESIUM: 1.4 mg/dL — AB (ref 1.5–2.5)

## 2014-12-20 MED ORDER — MAGNESIUM SULFATE 2 GM/50ML IV SOLN
2.0000 g | Freq: Once | INTRAVENOUS | Status: AC
  Administered 2014-12-20: 2 g via INTRAVENOUS
  Filled 2014-12-20: qty 50

## 2014-12-20 MED ORDER — TRACE MINERALS CR-CU-F-FE-I-MN-MO-SE-ZN IV SOLN
INTRAVENOUS | Status: AC
  Administered 2014-12-20: 18:00:00 via INTRAVENOUS
  Filled 2014-12-20: qty 1920

## 2014-12-20 MED ORDER — ZOLPIDEM TARTRATE 5 MG PO TABS
10.0000 mg | ORAL_TABLET | Freq: Every day | ORAL | Status: DC
  Administered 2014-12-20 – 2014-12-21 (×2): 10 mg via ORAL
  Filled 2014-12-20 (×6): qty 2

## 2014-12-20 MED ORDER — FAT EMULSION 20 % IV EMUL
240.0000 mL | INTRAVENOUS | Status: AC
  Administered 2014-12-20: 240 mL via INTRAVENOUS
  Filled 2014-12-20: qty 250

## 2014-12-20 MED ORDER — ZOLPIDEM TARTRATE 5 MG PO TABS: 10.0000 mg | ORAL_TABLET | Freq: Every evening | ORAL | Status: DC | PRN

## 2014-12-20 MED ORDER — OXYCODONE HCL 20 MG/ML PO CONC
40.0000 mg | Freq: Four times a day (QID) | ORAL | Status: DC
  Administered 2014-12-20 – 2014-12-21 (×3): 40 mg via ORAL
  Filled 2014-12-20 (×3): qty 2

## 2014-12-20 NOTE — Progress Notes (Signed)
PARENTERAL NUTRITION CONSULT NOTE  Pharmacy Consult for TPN Indication: prolonged ileus  Allergies  Allergen Reactions  . Remicade [Infliximab]     Dr Karilyn Cota states previous respiratory arrest not related to remicade. Dr Karilyn Cota states remicade not a drug related allergy. 07-07-2013 at 1025 rapid response called to PACU- Patient difficulty breathing after infusion of Remicade infusing. Do NOT Give Remicade!  . Fentanyl And Related Itching    Swelling, Redness  . Alfentanil Itching    Swelling, Redness  . Wellbutrin [Bupropion] Nausea And Vomiting  . Morphine And Related Hives   Patient Measurements: Height: 5\' 11"  (180.3 cm) Weight: 186 lb 8 oz (84.596 kg) IBW/kg (Calculated) : 75.3  Usual body weight ~ 100kg  Vital Signs: Temp: 98.8 F (37.1 C) (01/07 0530) Temp Source: Oral (01/07 0530) BP: 102/68 mmHg (01/07 0530) Pulse Rate: 86 (01/07 0530) Intake/Output from previous day: 01/06 0701 - 01/07 0700 In: 1260 [P.O.:360; TPN:900] Out: 800 [Urine:800] Intake/Output from this shift: Total I/O In: 0  Out: 1000 [Urine:1000]  Labs:  Recent Labs  12/18/14 0546 12/19/14 0719 12/20/14 0551  WBC 6.9 6.0 6.3  HGB 8.1* 8.3* 8.1*  HCT 25.5* 26.8* 26.5*  PLT 88* 84* 75*     Recent Labs  12/18/14 0546 12/19/14 0719 12/20/14 0551  NA 131* 133* 132*  K 3.2* 4.2 3.9  CL 101 102 99  CO2 25 28 29   GLUCOSE 125* 143* 114*  BUN 17 16 17   CREATININE 0.64 0.62 0.54  CALCIUM 7.2* 7.5* 7.5*  MG  --   --  1.4*  PHOS  --   --  2.6  PROT 4.4*  --  4.3*  ALBUMIN 2.3*  --  2.3*  AST 40*  --  44*  ALT 39  --  67*  ALKPHOS 97  --  111  BILITOT 0.7  --  0.8   Estimated Creatinine Clearance: 133.3 mL/min (by C-G formula based on Cr of 0.54).    Recent Labs  12/19/14 1714 12/20/14 0056 12/20/14 0630  GLUCAP 104* 150* 119*   Medical History: Past Medical History  Diagnosis Date  . 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 Crohn's disease with fistula 05/04/2011    both large and  small intestinges/notes 11/15/2012  . Hepatomegaly 05/04/2011  . Fatty liver 05/04/2011    "stage III fatty liver fibrosis" (11/15/2012)  . GERD (gastroesophageal reflux disease)   . Chronic liver disease     /notes 11/15/2012  . Pericarditis     [name] 11/15/2012  . Hypertension   . Pneumonia 1977  . Shortness of breath     "all the time right now" (11/15/2012)  . History of blood transfusion 2004  . History of stomach ulcers   . Duodenal ulcer   . Depression   . Kidney stones     bilaterally/notes 11/15/2012  . Hepatic fibrosis     [name] 11/15/2012  . Anemia   . Pericardial effusion 10/29/2012    moderate to large/notes 11/15/2012  . Anxiety   . Hepatitis   . Crohn's disease   . ED (erectile dysfunction)   . Cirrhosis     secondary to drug effect, +/- fatty liver   Insulin Requirements in the past 24 hours:  0 units  Current Nutrition:  TPN  Assessment: Brief narrative: 39 y/o ? hx crohns diag ~2002, symptoms since age 39 s/p Gastrojejunostomy 2004 complicated open abd + Mesh, Cirrhosis 2/2 to MTX use? Vs NASH [Meld score 9], dysplastic hepatic nodule, EGD 11/14=grd  1 varices recent attempt at ex-lap 11/23 [not successful 2/2 to frozen abd] admitted with generalized abd pain, n,v. States that he just been seen at [facility] 12/05/14 to change out his PEG tube to a larger size but was not done due to low BP.  Was sent home to be with family over holidays. Over that period of time he progressively got weaker and weaker and felt more ill or nauseous and was unable to keep anything down.  He was just discharged from [facility] 2 days ago with TPN but was readmitted due to choking and possible aspiration after eating at home.   He also has appt on Friday at [facility] to be evaluated for liver/colon transplant.   He is clinically improving.  Na chronically low, Mg low today  CBGs mostly in goal range.  24hr I/O +460  Estimated Nutritional Needs: Kcal: 2100-2300 Protein: 96-106 grams Fluid: 2.1-2.3  L  Plan:   Continue TPN with Clinimix E 5/15 at 23ml/hr today   Provide lipids, trace elements, and MVI with TPN  Replace Mg with Mag Sulfate 2gm IV today  Continue sliding scale insulin with CBG checks q6hrs  Monitor lytes, triglycerides, glucose tolerance, renal fxn, and fluid status  Margo Aye, Jaicion Laurie A 12/20/2014,11:28 AM

## 2014-12-20 NOTE — Progress Notes (Signed)
PROGRESS NOTE  David [name] ZOX:[id number] DOB: 1976-07-26 DOA: 12/15/2014 PCP: [name], MD   Summary: 39 year old man with severe Crohn's disease, also cirrhosis with chronic thrombocytopenia and esophageal varices rehospitalize for persistent abdominal pain. He was discharged 1/1 and return to the emergency department 1/2 for persistent pain, generalized weakness, developing lower extremity edema and aspiration/shortness of breath after eating a grilled cheese sandwich. Abdominal x-ray was unremarkable and the patient refused CT scan. He was admitted for aspiration pneumonia, early sepsis, acute on chronic abdominal pain with concern for ileus, hypotension, concern for SBP? He rapidly improved and was narrowed to oral antibiotics for aspiration pneumonia.  Assessment/Plan: 1. Aspiration pneumonia with suspected sepsis on admission. Secondary to aspiration event at home after eating grilled cheese. Patient should be nothing by mouth except for small sips with liquids. Chronic risk factor for aspiration secondary to small bowel stricture with recurrent nausea and vomiting 2. Crohn's disease with terminal ileal stricture, s/p multiple dilations,, on TPN. Continues on TPN. Gastrostomy tube in place for decompression. No evidence of obstruction clinically, multiple bowel movements, passes flatus. 3. 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 4. Gastroparesis. Continue PPI twice a day, Carafate, low-dose Reglan 5. Anemia of chronic disease with iron deficiency, received IV iron last admission as well as 4 units packed red blood cells. No evidence of bleeding. Hemoglobin remains stable. 6. Thrombocytopenia stable. Stable, secondary to underlying liver disease. 7. Chronic steroid therapy for Crohn's disease.  8. Anasarca. Slowly improving with diuresis. 9. Chronic abdominal pain, frozen abdomen.   Respiratory status is stable and aspiration pneumonia appears resolved.  Main issue at this  point his pain control which is been difficult. Increase oxycodone, continue hydromorphone.  Continue PPI BID, carafate, lipase Reglan for GI. Ice chips and sips of liquids with medications only, otherwise nothing by mouth. Limit liquids < 8 ounces per 24 hours.   Gastrostomy tube to drainage except one oral medications given, then clamped for 1 hour.  Continue TPN. Stop stress dose steroids  Continue prednisone. Continue Flagyl until GI pathogen panel negative.  Discussed in detail with Dr. Phillips Grout case. The patient has an appointment at [facility]. Dr. Karilyn Cota discussed with [facility] specialist who declined transfer and recommended the patient keep his outpatient appointment tomorrow. Whether this is permissible by hospital policy is unknown at this point, this will be explored. Would not like him to miss his appointment however if possible. Given difficulty of pain control he is not ready for discharge.  Code Status: full DVT prophylaxis: SCDs Family Communication: none present Disposition Plan: home  Brendia Sacks, MD  [facility]  Pager [phone] If 7PM-7AM, please contact night-coverage at [email or web address], password [facility] 12/20/2014, 1:19 PM  LOS: 5 days   Consultants:  Gastroenterology   physical therapy no needs  Occupational therapy no needs    Procedures:    Antibiotics:  Augmentin 1/5 >> 1/8  Zosyn 1/2 >> 1/5  HPI/Subjective: Interval nursing note reviewed, emotional support was given.  He reports ongoing pain in his abdomen, crescent shaped, right side of the abdomen. No relief with adjustment to oral narcotic. Reports very poor sleep He reports this is the typical pain that has been worsening since February 2015 when he developed obstructive type problems. His pain has become gradually more intense over time. He reports flatus and 2 bowel movements today. He does not think this is new pain but really worsening of his chronic pain. Lower extremity  edema is gradually improving.  Objective: Filed Vitals:  12/19/14 1348 12/19/14 1854 12/19/14 2132 12/20/14 0530  BP: 108/68  103/69 102/68  Pulse: 95  102 86  Temp: 98.8 F (37.1 C)  98.9 F (37.2 C) 98.8 F (37.1 C)  TempSrc: Oral  Oral Oral  Resp: 16  20 20   Height:      Weight:  85.775 kg (189 lb 1.6 oz)  84.596 kg (186 lb 8 oz)  SpO2: 99%  100% 92%    Intake/Output Summary (Last 24 hours) at 12/20/14 1319 Last data filed at 12/20/14 1242  Gross per 24 hour  Intake    950 ml  Output   1500 ml  Net   -550 ml     Filed Weights   12/16/14 0500 12/19/14 1854 12/20/14 0530  Weight: 85.8 kg (189 lb 2.5 oz) 85.775 kg (189 lb 1.6 oz) 84.596 kg (186 lb 8 oz)    Exam:    Afebrile, vital signs are stable. No hypoxia. General:  Appears calm and uncomfortable but nontoxic. Cardiovascular: RRR, no m/r/g. 2+ bilateral lower extremity edema slightly better. Respiratory: CTA bilaterally, no w/r/r. Normal respiratory effort. Musculoskeletal: grossly normal tone BUE/BLE Psychiatric: grossly normal mood and affect, speech fluent and appropriate  Data Reviewed:  Urine output 800, -2.4 L since admission.   Capillary blood sugar stable sodium stable, 132. Potassium normal 3.9. Mild elevation of AST, ALT.creatinine normal.  Hemoglobin stable 8.1, platelet count stable 75.   Abdominal x-ray 1/6: Paucity of intra-abdominal bowel gas. No free air. Gastrostomy tube in place.  Pertinent data: Admission Labs  Hemoglobin 8.7  Urinalysis negative  Respiratory culture no growth, final. Normal oral pharyngeal flora.  Pending data:  Blood cultures no growth, 4 days  Scheduled Meds: . acidophilus  1 capsule Oral Daily  . amoxicillin-clavulanate  800 mg Oral Q12H  . furosemide  40 mg Intravenous BID  . hydrocortisone sod succinate (SOLU-CORTEF) inj  50 mg Intravenous Daily  . insulin aspart  0-20 Units Subcutaneous 4 times per day  . magnesium sulfate 1 - 4 g bolus IVPB  2 g  Intravenous Once  . metoCLOPramide (REGLAN) injection  5 mg Intravenous 3 times per day  . oxyCODONE  30 mg Oral QID  . pantoprazole (PROTONIX) IV  40 mg Intravenous Q12H  . sodium chloride  10 mL Intravenous Q12H  . sodium chloride  3 mL Intravenous Q12H   Continuous Infusions: . Marland KitchenTPN (CLINIMIX-E) Adult 80 mL/hr at 12/19/14 1723   And  . fat emulsion 240 mL (12/19/14 1723)  . Marland KitchenTPN (CLINIMIX-E) Adult     And  . 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

## 2014-12-20 NOTE — Progress Notes (Signed)
Subjective:  Patient continues to complain of constant abdominal pain which is primarily in epigastric and fatty umbilical region. He states he is getting some relief with IV Dilaudid. He believes by mouth medication is not helping. He feels not eating has caused him to have more pain. He is passing flatus and had 2 bowel movements today when he passed small amount of motion stool. He denies rectal bleeding melena shortness of breath or chest pain.   Objective: BP 106/67 mmHg  Pulse 78  Temp(Src) 98.7 F (37.1 C) (Oral)  Resp 18  Ht  (1.803 m)  Wt 186 lb 8 oz (84.596 kg)  BMI 26.02 kg/m2  SpO2 100% Abdominal exam reveals distended abdomen primarily in [place] umbilical and epigastric region. Gastrostomy tube is in place. It is draining greenish liquid. Bowel sounds are hyperactive. There is edema to lower abdominal wall. On palpation abdomen is doughy and jellylike tenderness no rebound. Some clotting noted in the midepigastric region that he has moderate tenderness. He has mild scrotal edema. He also has edema to upper and lower back as well as 2-3+ edema involving both legs and thighs.  Gastrostomy output noted to be 1200 mL last 24 hours. Urine output 800 mL last 24 hours.  Labs/studies Results:   Recent Labs  12/18/14 0546 12/19/14 0719 12/20/14 0551  WBC 6.9 6.0 6.3  HGB 8.1* 8.3* 8.1*  HCT 25.5* 26.8* 26.5*  PLT 88* 84* 75*    BMET   Recent Labs  12/18/14 0546 12/19/14 0719 12/20/14 0551  NA 131* 133* 132*  K 3.2* 4.2 3.9  CL 101 102 99  CO2 GLUCOSE 125* 143* 114*  BUN CREATININE 0.64 0.62 0.54  CALCIUM 7.2* 7.5* 7.5*    LFT   Recent Labs  12/18/14 0546 12/20/14 0551  PROT 4.4* 4.3*  ALBUMIN 2.3* 2.3*  AST 40* 44*  ALT 39 67*  ALKPHOS 97 111  BILITOT 0.7 0.8    AAS reveal few small air-fluid levels and groundglass appearance.  Assessment:  #1. Crohn's disease complicated by small bowel strictures. Balloon dilation  last year provided temporary relief and attempt at surgical correction in November 2015 failed because he has frozen abdomen. Patient's condition reviewed with Dr. Marval Regal of [facility] transplant service. He is looking forward to see patient in clinic tomorrow and unable to arrange for transfer at this time. #2. Acute on chronic abdominal pain with dependence on narcotics. Pain control not satisfactory. 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 Dr. Irene Limbo is titrating pain medication. #3. Aspiration pneumonia. He is improving with therapy. He has been switched to oral antibiotic therapy and I'm not sure if he is able to absorb it. #4. Fluid overload secondary to hypoalbuminemia. Patient is on TPN and albumin has come up slightly. #5. Cirrhosis secondary to methotrexate and perhaps underlying liver injury due to inflammatory bowel disease. He is tolerating TPN so far. This is not a long-term option as he is at risk for cholestasis. He is in need of liver transplant along with small bowel transplant. Meld score based on left from 12/16/2014 is 11(UNOS) and 6(MAYO). #6. Anemia appears to be multifactorial. Etiologies include chronic disease and iron deficiency anemia. #7. Insomnia possibly due to abdominal pain. #8. Osteoporosis. Treatment not finalized.  Please note I discussed patient's condition with Dr. Marval Regal, transplant surgeon at [facility]. He would like for patient to come for  outpatient evaluation in a.m and then he will determine the next step. At this time he is not recommending transfer.  Recommendations;  Continue nothing by mouth status except for by mouth meds. If possible patient will be given 6 hour day past that he could be taken by his wife to [facility] for outpatient visit. Reevaluate diuretic therapy based on his weight and metabolic 7 in a.m. INR in a.m.

## 2014-12-20 NOTE — Progress Notes (Signed)
NUTRITION FOLLOW UP  Intervention:   -TPN per pharmacy  Nutrition Dx:   Inadequate oral intake related to altered GI function as evidenced by NPO; ongoing   Goal:   Pt will meet >90% of estimated nutritional needs; progressing via TPN  Monitor:  TPN tolerance, transition to PO diet/enteral feedings (if feasible), labs, weight changes, I/O's   Assessment:   David [name] is a 39 y.o. male with a history of Crohn disease with fistula, Stage III fatty liver fibrosis and chronic liver disease, anemia, hypertension, failure to thrive, on chronic steroids. Patient underwent exploratory laparotomy at [facility] on 11/05/2014 with plans of lysis adhesions and possible bowel resection, but surgery was never able to completely enter the abdomen due to frozen abdomen. Patient has a gastrostomy tube placed percutaneously. Over the past several weeks, the patient's abdominal pain has been increasing.   Pt remains NPO with sips of liquids due to multiple GI issues. He has an appointment with [facility] on 12/21/13 to evaluate for colon and liver transplant. Per MD notes, possibility for inpatient transfer to [facility].  He remains on TPN Clinimix 5/15 @ 80 ml/hr to start today at 1800 and lipids daily. Which will provide: 1843 kcal, 96 gr protein and 2160 ml. Meets 88% energy and 100% protein goal.  He continues with good output for G-tube (2 L in the past 24 hours per GI notes). Noted 3# wt loss since admission, likely due to diuretic use. He remains fluid overloaded.  Labs reviewed. Na: 133, Calcium: 7.5, Phos: 2.2, Glucose: 143. CBGS: 104-150. Mg and K WDL.   Height: Ht Readings from Last 1 Encounters:  12/15/14  (1.803 m)    Weight Status:   Wt Readings from Last 1 Encounters:  12/20/14 186 lb 8 oz (84.596 kg)   12/16/14 189 lb 2.5 oz (85.8 kg)   Re-estimated needs:  Kcal: 2100-2300 Protein: 96-106 grams Fluid: 2.1-2.3 L  Skin: mid abdominal incision, 2-3+ pitting edema involving  both legs, edema on thighs  Diet Order: .TPN (CLINIMIX-E) Adult TPN (CLINIMIX-E) Adult   Intake/Output Summary (Last 24 hours) at 12/20/14 1138 Last data filed at 12/20/14 1052  Gross per 24 hour  Intake   1140 ml  Output   1800 ml  Net   -660 ml    Last BM: 12/20/14   Labs:   Recent Labs Lab 12/14/14 0551  12/17/14 0548 12/18/14 0546 12/19/14 0719 12/20/14 0551  NA 132*  < > 129* 131* 133* 132*  K 3.1*  < > 3.7 3.2* 4.2 3.9  CL 103  < > 101 101 102 99  CO2 25  < > BUN 17  < > CREATININE 0.60  < > 0.61 0.64 0.62 0.54  CALCIUM 7.4*  < > 7.4* 7.2* 7.5* 7.5*  MG  --   --  1.5  --   --  1.4*  PHOS 2.2*  --  3.4  --   --  2.6  GLUCOSE 132*  < > 199* 125* 143* 114*  < > = values in this interval not displayed.  CBG (last 3)   Recent Labs  12/19/14 1714 12/20/14 0056 12/20/14 0630  GLUCAP 104* 150* 119*    Scheduled Meds: . acidophilus  1 capsule Oral Daily  . amoxicillin-clavulanate  800 mg Oral Q12H  . furosemide  40 mg Intravenous BID  . hydrocortisone sod succinate (SOLU-CORTEF) inj  50 mg Intravenous Daily  .  insulin aspart  0-20 Units Subcutaneous 4 times per day  . magnesium sulfate 1 - 4 g bolus IVPB  2 g Intravenous Once  . metoCLOPramide (REGLAN) injection  5 mg Intravenous 3 times per day  . oxyCODONE  30 mg Oral QID  . pantoprazole (PROTONIX) IV  40 mg Intravenous Q12H  . sodium chloride  10 mL Intravenous Q12H  . sodium chloride  3 mL Intravenous Q12H    Continuous Infusions: . Marland KitchenTPN (CLINIMIX-E) Adult 80 mL/hr at 12/19/14 1723   And  . fat emulsion 240 mL (12/19/14 1723)  . Marland KitchenTPN (CLINIMIX-E) Adult     And  . fat emulsion      Ayron Fillinger A. Mayford Knife, RD, LDN, CDE Pager: [phone] After hours Pager: [phone]

## 2014-12-21 ENCOUNTER — Inpatient Hospital Stay (HOSPITAL_COMMUNITY): Payer: Medicare Other

## 2014-12-21 DIAGNOSIS — R1031 Right lower quadrant pain: Secondary | ICD-10-CM

## 2014-12-21 DIAGNOSIS — K9422 Gastrostomy infection: Secondary | ICD-10-CM

## 2014-12-21 DIAGNOSIS — R509 Fever, unspecified: Secondary | ICD-10-CM

## 2014-12-21 DIAGNOSIS — L03818 Cellulitis of other sites: Secondary | ICD-10-CM

## 2014-12-21 LAB — GLUCOSE, CAPILLARY
GLUCOSE-CAPILLARY: 104 mg/dL — AB (ref 70–99)
GLUCOSE-CAPILLARY: 117 mg/dL — AB (ref 70–99)
GLUCOSE-CAPILLARY: 94 mg/dL (ref 70–99)
Glucose-Capillary: 89 mg/dL (ref 70–99)

## 2014-12-21 LAB — PROTIME-INR
INR: 1.24 (ref 0.00–1.49)
PROTHROMBIN TIME: 15.7 s — AB (ref 11.6–15.2)

## 2014-12-21 LAB — BASIC METABOLIC PANEL
ANION GAP: 4 — AB (ref 5–15)
BUN: 15 mg/dL (ref 6–23)
CHLORIDE: 94 meq/L — AB (ref 96–112)
CO2: 30 mmol/L (ref 19–32)
CREATININE: 0.51 mg/dL (ref 0.50–1.35)
Calcium: 7.3 mg/dL — ABNORMAL LOW (ref 8.4–10.5)
GLUCOSE: 91 mg/dL (ref 70–99)
POTASSIUM: 3.4 mmol/L — AB (ref 3.5–5.1)
SODIUM: 128 mmol/L — AB (ref 135–145)

## 2014-12-21 LAB — CLOSTRIDIUM DIFFICILE BY PCR: CDIFFPCR: NEGATIVE

## 2014-12-21 MED ORDER — SODIUM CHLORIDE 0.9 % IV SOLN
3.0000 g | Freq: Four times a day (QID) | INTRAVENOUS | Status: DC
  Administered 2014-12-21 – 2014-12-23 (×8): 3 g via INTRAVENOUS
  Filled 2014-12-21 (×12): qty 3

## 2014-12-21 MED ORDER — VANCOMYCIN HCL IN DEXTROSE 1-5 GM/200ML-% IV SOLN
1000.0000 mg | Freq: Three times a day (TID) | INTRAVENOUS | Status: DC
  Administered 2014-12-21 – 2014-12-26 (×15): 1000 mg via INTRAVENOUS
  Filled 2014-12-21 (×18): qty 200

## 2014-12-21 MED ORDER — ACETAMINOPHEN 325 MG PO TABS
650.0000 mg | ORAL_TABLET | Freq: Four times a day (QID) | ORAL | Status: DC | PRN
  Administered 2014-12-21 – 2014-12-22 (×2): 650 mg via ORAL
  Filled 2014-12-21 (×3): qty 2

## 2014-12-21 MED ORDER — OXYCODONE HCL 20 MG/ML PO CONC
20.0000 mg | Freq: Four times a day (QID) | ORAL | Status: DC
  Administered 2014-12-21 – 2014-12-30 (×34): 20 mg via ORAL
  Filled 2014-12-21 (×36): qty 1

## 2014-12-21 MED ORDER — TRACE MINERALS CR-CU-F-FE-I-MN-MO-SE-ZN IV SOLN
INTRAVENOUS | Status: AC
  Administered 2014-12-21: 17:00:00 via INTRAVENOUS
  Filled 2014-12-21: qty 1920

## 2014-12-21 MED ORDER — FAT EMULSION 20 % IV EMUL
240.0000 mL | INTRAVENOUS | Status: AC
  Administered 2014-12-21: 240 mL via INTRAVENOUS
  Filled 2014-12-21: qty 250

## 2014-12-21 MED ORDER — VANCOMYCIN HCL 10 G IV SOLR
1500.0000 mg | INTRAVENOUS | Status: AC
  Administered 2014-12-21: 1500 mg via INTRAVENOUS
  Filled 2014-12-21: qty 1500

## 2014-12-21 MED ORDER — HYDROMORPHONE HCL 1 MG/ML IJ SOLN
3.0000 mg | INTRAMUSCULAR | Status: DC | PRN
  Administered 2014-12-21 – 2014-12-26 (×53): 3 mg via INTRAVENOUS
  Filled 2014-12-21 (×53): qty 3

## 2014-12-21 NOTE — Progress Notes (Addendum)
ANTIBIOTIC CONSULT NOTE - INITIAL  Pharmacy Consult for Vancomycin Indication: wound infection  Allergies  Allergen Reactions  . Remicade [Infliximab]     Dr Karilyn Cota states previous respiratory arrest not related to remicade. Dr Karilyn Cota states remicade not a drug related allergy. 07-07-2013 at 1025 rapid response called to PACU- Patient difficulty breathing after infusion of Remicade infusing. Do NOT Give Remicade!  . Fentanyl And Related Itching    Swelling, Redness  . Alfentanil Itching    Swelling, Redness  . Wellbutrin [Bupropion] Nausea And Vomiting  . Morphine And Related Hives    Patient Measurements: Height:  (180.3 cm) Weight: 185 lb 8 oz (84.142 kg) IBW/kg (Calculated) : 75.3  Vital Signs: Temp: 101.3 F (38.5 C) (01/08 0645) Temp Source: Rectal (01/08 0645) BP: 113/72 mmHg (01/08 0555) Pulse Rate: 95 (01/08 0555) Intake/Output from previous day: 01/07 0701 - 01/08 0700 In: 3446.2 [P.O.:120; IV Piggyback:50; TPN:3276.2] Out: 4400 [Urine:3400; Drains:1000] Intake/Output from this shift:    Labs:  Recent Labs  12/19/14 0719 12/20/14 0551  WBC 6.0 6.3  HGB 8.3* 8.1*  PLT 84* 75*  CREATININE 0.62 0.54   Estimated Creatinine Clearance: 133.3 mL/min (by C-G formula based on Cr of 0.54). No results for input(s): VANCOTROUGH, VANCOPEAK, VANCORANDOM, GENTTROUGH, GENTPEAK, GENTRANDOM, TOBRATROUGH, TOBRAPEAK, TOBRARND, AMIKACINPEAK, AMIKACINTROU, AMIKACIN in the last 72 hours.   Microbiology: Recent Results (from the past 720 hour(s))  Blood culture (routine x 2)     Status: None   Collection Time: 12/09/14  5:36 PM  Result Value Ref Range Status   Specimen Description BLOOD RIGHT ANTECUBITAL  Final   Special Requests BOTTLES DRAWN AEROBIC AND ANAEROBIC 10CC  Final   Culture NO GROWTH 6 DAYS  Final   Report Status 12/15/2014 FINAL  Final  Blood culture (routine x 2)     Status: None   Collection Time: 12/09/14  5:45 PM  Result Value Ref Range Status    Specimen Description BLOOD LEFT HAND  Final   Special Requests BOTTLES DRAWN AEROBIC ONLY 5CC  Final   Culture NO GROWTH 6 DAYS  Final   Report Status 12/15/2014 FINAL  Final  Blood culture (routine x 2)     Status: None   Collection Time: 12/15/14  2:09 PM  Result Value Ref Range Status   Specimen Description BLOOD PORTA CATH DRAWN BY RN  Final   Special Requests BOTTLES DRAWN AEROBIC ONLY 6CC  Final   Culture NO GROWTH 5 DAYS  Final   Report Status 12/20/2014 FINAL  Final  Blood culture (routine x 2)     Status: None   Collection Time: 12/15/14  2:15 PM  Result Value Ref Range Status   Specimen Description BLOOD LEFT ANTECUBITAL  Final   Special Requests BOTTLES DRAWN AEROBIC ONLY 6CC  Final   Culture NO GROWTH 5 DAYS  Final   Report Status 12/20/2014 FINAL  Final  MRSA PCR Screening     Status: None   Collection Time: 12/15/14  6:00 PM  Result Value Ref Range Status   MRSA by PCR NEGATIVE NEGATIVE Final    Comment:        The GeneXpert MRSA Assay (FDA approved for NASAL specimens only), is one component of a comprehensive MRSA colonization surveillance program. It is not intended to diagnose MRSA infection nor to guide or monitor treatment for MRSA infections.   Culture, respiratory (NON-Expectorated)     Status: None   Collection Time: 12/15/14  8:00 PM  Result Value  Ref Range Status   Specimen Description SPUTUM  Final   Special Requests NONE  Final   Gram Stain   Final    ABUNDANT WBC PRESENT, PREDOMINANTLY PMN NO SQUAMOUS EPITHELIAL CELLS SEEN MODERATE YEAST FEW GRAM NEGATIVE RODS FEW GRAM POSITIVE RODS    Culture   Final    NORMAL OROPHARYNGEAL FLORA Performed at [facility]    Report Status 12/18/2014 FINAL  Final    Medical History: Past Medical History  Diagnosis Date  . 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 Hepatomegaly 05/04/2011  . Fatty liver  05/04/2011    "stage III fatty liver fibrosis" (11/15/2012)  . GERD (gastroesophageal reflux disease)   . Chronic liver disease     /notes 11/15/2012  . Pericarditis     [name] 11/15/2012  . Hypertension   . Pneumonia 1977  . Shortness of breath     "all the time right now" (11/15/2012)  . History of blood transfusion 2004  . History of stomach ulcers   . Duodenal ulcer   . Depression   . Kidney stones     bilaterally/notes 11/15/2012  . Hepatic fibrosis     [name] 11/15/2012  . Anemia   . Pericardial effusion 10/29/2012    moderate to large/notes 11/15/2012  . Anxiety   . Hepatitis   . Crohn's disease   . ED (erectile dysfunction)   . 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 acidophilus  1 capsule Oral Daily  . amoxicillin-clavulanate  800 mg Oral Q12H  . furosemide  40 mg Intravenous BID  . insulin aspart  0-20 Units Subcutaneous 4 times per day  . metoCLOPramide (REGLAN) injection  5 mg Intravenous 3 times per day  . oxyCODONE  40 mg Oral QID  . pantoprazole (PROTONIX) IV  40 mg Intravenous Q12H  . sodium chloride  10 mL Intravenous Q12H  . sodium chloride  3 mL Intravenous Q12H  . vancomycin  1,500 mg Intravenous NOW   Followed by  . vancomycin  1,000 mg Intravenous Q8H  . zolpidem  10 mg Oral QHS   Assessment: 32 yoM with complicated GI history well known to pharmacy from TPN monitoring.  He began complaining of increased pain, burning at G-tube site overnight.  Purulent drainage was noted and patient had low grade fever this morning (101.3 F rectally).  WBC is normal.  Cx data pending.  He has been on abx therapy for asp PNA which was switched to oral Augmentin on 1/5.  Seems patient is clinically improved from this standpoint & concern for absorption of Augmentin orally and worsening GI sx.     Renal function has been stable at patient's baseline since admission.   Vancomycin 1/8>> Augmentin 1/5>> Zosyn  1/2>>1/5  Goal of Therapy:  Vancomycin trough level 15-20 mcg/ml  Plan:  Vancomycin 1500mg  IV x1 now (since patient going to leave for appt at [facility] today) followed by Vancomycin 1gm IV q8h Monitor renal function and cx data  Duration of therapy per MD *Consider d/c Augmentin since patient will complete 7 days of abx tx for asp PNA today  Rei Contee, [name] 12/21/2014,7:59 AM  Also asked to add Unasyn for wound cx.  F/u clinical progress. Plan: D/C Augmentin Unasyn 3gm IV q6h  Junita Push, PharmD, BCPS 12/21/2014@3 :23 PM

## 2014-12-21 NOTE — Progress Notes (Signed)
PARENTERAL NUTRITION CONSULT NOTE  Pharmacy Consult for TPN Indication: prolonged ileus  Allergies  Allergen Reactions  . Remicade [Infliximab]     Dr Karilyn Cota states previous respiratory arrest not related to remicade. Dr Karilyn Cota states remicade not a drug related allergy. 07-07-2013 at 1025 rapid response called to PACU- Patient difficulty breathing after infusion of Remicade infusing. Do NOT Give Remicade!  . Fentanyl And Related Itching    Swelling, Redness  . Alfentanil Itching    Swelling, Redness  . Wellbutrin [Bupropion] Nausea And Vomiting  . Morphine And Related Hives   Patient Measurements: Height: 5\' 11"  (180.3 cm) Weight: 185 lb 8 oz (84.142 kg) IBW/kg (Calculated) : 75.3  Usual body weight ~ 100kg  Vital Signs: Temp: 101.3 F (38.5 C) (01/08 0645) Temp Source: Rectal (01/08 0645) BP: 113/72 mmHg (01/08 0555) Pulse Rate: 95 (01/08 0555) Intake/Output from previous day: 01/07 0701 - 01/08 0700 In: 3446.2 [P.O.:120; IV Piggyback:50; TPN:3276.2] Out: 4400 [Urine:3400; Drains:1000] Intake/Output from this shift:    Labs:  Recent Labs  12/19/14 0719 12/20/14 0551 12/21/14 0624  WBC 6.0 6.3  --   HGB 8.3* 8.1*  --   HCT 26.8* 26.5*  --   PLT 84* 75*  --   INR  --   --  1.24     Recent Labs  12/19/14 0719 12/20/14 0551  NA 133* 132*  K 4.2 3.9  CL 102 99  CO2 28 29  GLUCOSE 143* 114*  BUN 16 17  CREATININE 0.62 0.54  CALCIUM 7.5* 7.5*  MG  --  1.4*  PHOS  --  2.6  PROT  --  4.3*  ALBUMIN  --  2.3*  AST  --  44*  ALT  --  67*  ALKPHOS  --  111  BILITOT  --  0.8   Estimated Creatinine Clearance: 133.3 mL/min (by C-G formula based on Cr of 0.54).    Recent Labs  12/20/14 1830 12/21/14 0007 12/21/14 0553  GLUCAP 136* 117* 89   Medical History: Past Medical History  Diagnosis Date  . 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 Hepatomegaly 05/04/2011  .  Fatty liver 05/04/2011    "stage III fatty liver fibrosis" (11/15/2012)  . GERD (gastroesophageal reflux disease)   . Chronic liver disease     /notes 11/15/2012  . Pericarditis     [name] 11/15/2012  . Hypertension   . Pneumonia 1977  . Shortness of breath     "all the time right now" (11/15/2012)  . History of blood transfusion 2004  . History of stomach ulcers   . Duodenal ulcer   . Depression   . Kidney stones     bilaterally/notes 11/15/2012  . Hepatic fibrosis     [name] 11/15/2012  . Anemia   . Pericardial effusion 10/29/2012    moderate to large/notes 11/15/2012  . Anxiety   . Hepatitis   . Crohn's disease   . ED (erectile dysfunction)   . Cirrhosis     secondary to drug effect, +/- fatty liver   Insulin Requirements in the past 24 hours:  0 units  Current Nutrition:  TPN  Assessment: Brief narrative: 39 y/o ? 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 States that he just been seen at [facility] 12/05/14 to change out his PEG tube to a larger size but was not done due to low BP.  Was sent home to be with family over holidays. Over that period of time he progressively got weaker and weaker and felt more ill or nauseous and was unable to keep anything down.  He was hospitalized at [facility] 12/27-31 and discharged with TPN but was readmitted due to choking and possible aspiration after eating at home.   He also has appt today at [facility] to be evaluated for liver/colon transplant.   Na chronically low but stable.  Mg low today  CBGs mostly in goal range.  24hr I/O +460  Estimated Nutritional Needs: Kcal: 2100-2300 Protein: 96-106 grams Fluid: 2.1-2.3 L  Plan:   Continue TPN with Clinimix E 5/15 at 54ml/hr today   Provide  lipids, trace elements, and MVI with TPN  Continue sliding scale insulin with CBG checks q6hrs  Monitor lytes, triglycerides, glucose tolerance, renal fxn, and fluid status  Amani Nodarse, [name] 12/21/2014,7:54 AM

## 2014-12-21 NOTE — Progress Notes (Signed)
Subjective:  Patient continues to complain of abdominal pain which is centered mainly in upper and mid abdomen. Patient also reports having chills and fever this morning. He reports drainage from gastrostomy tube site. Culture was taken along with blood and urine cultures. He is passing some flatus and just had a bowel movement when he passed greenish liquid stool. He states he was not able to sleep last night because of abdominal pain. He denies cough or shortness of breath. He feels his lower extremity edema has not changed and he finds it difficult to move around.    Objective: Blood pressure 113/72, pulse 95, temperature 101.3 F (38.5 C), temperature source Rectal, resp. rate 20, height 5\' 11"  (1.803 m), weight 185 lb 8 oz (84.142 kg), SpO2 100 %. Patient is alert and in no acute distress. Conjunctiva is pale. Sclera is nonicteric Abdomen is distended particularly in the upper half. Bowel sounds are normal. Mucopurulent drainage noted at gastrostomy tube site. There is pitting edema to lower abdominal wall. Abdomen is soft with mild generalized tenderness. He has moderate tenderness in epigastric area right upper quadrant and fatty umbilical region.   Urine output last 24 hours was 3.4 L Gastrostomy tube output last 24 hours was 1 L.  Labs/studies Results:   Recent Labs  12/19/14 0719 12/20/14 0551  WBC 6.0 6.3  HGB 8.3* 8.1*  HCT 26.8* 26.5*  PLT 84* 75*    BMET   Recent Labs  12/19/14 0719 12/20/14 0551 12/21/14 0956  NA 133* 132* 128*  K 4.2 3.9 3.4*  CL 102 99 94*  CO2 28 29 30   GLUCOSE 143* 114* 91  BUN 16 17 15   CREATININE 0.62 0.54 0.51  CALCIUM 7.5* 7.5* 7.3*    LFT   Recent Labs  12/20/14 0551  PROT 4.3*  ALBUMIN 2.3*  AST 44*  ALT 67*  ALKPHOS 111  BILITOT 0.8    PT/INR   Recent Labs  12/21/14 0624  LABPROT 15.7*  INR 1.24    Blood cultures from this morning pending  Urine culture from this morning pending Culture from gastrostomy  tube site pending.   Assessment:  #1. Fever most likely secondary to infection at gastrostomy tube site. Please note that 19 French gastrostomy tube was placed by Dr. Raina Mina of interventional radiology at [facility] on 11/19/2014 and it was changed to 20 [place] on 12/11/2014 by Dr. Deanne Coffer at [facility]. Suspect infection is localized. If he does not respond to antibiotic therapy within 48 hours will need to reimage his abdomen. Since he has diarrhea and has been on IV antibiotic for several days also need to rule out C. difficile colitis. My index of suspicion is low. Patient is on IV Vancomycin and by mouth Augmentin. I am not sure if he is able to absorb Augmentin therefore he should be switched to IV antibiotic such as Unasyn or Zosyn. #2. Aspiration pneumonia treated with antibiotic. #3. Crohn's disease with high-grade small bowel stricture with nonfunctional GI tract. Patient is on parenteral nutrition. #4. 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 Meld score is 9 based on INR from this morning and bilirubin and creatinine from yesterday. # 5. Fluid overload. He is diuresing well with IV furosemide. #6. Chronic abdominal pain. Control not satisfactory. #7. Anemia secondary to iron deficiency and chronic disease. H&H is low but stable. #8. Insomnia. #9. Osteoporosis.  Patient is in need of combined liver and small bowel transplant. He had an appointment  at [facility] which was canceled.  Recommendations;  Stool for C. difficile by PCR. Consider switching him from Augmentin to IV antibiotic. Discussed with Dr. Irene Limbo. Dr. Adele Schilder office at [facility] was contacted and and he was informed that patient will not be able to keep his appointment today which will have to be rescheduled.  Dr. Darrick Penna will be seen patient over the weekend.

## 2014-12-21 NOTE — Progress Notes (Signed)
Pt with temperature of 101.3 rectally .  Redness and purulent drainage noted to g tube site. Dr. Irene Limbo paged and orders received for blood cultures, UA, urine culture, chest xray, and Vancomycin to be dosed by Pharmacy.

## 2014-12-21 NOTE — Progress Notes (Signed)
PROGRESS NOTE  David [name] ZOX:[id number] DOB: Jul 03, 1976 DOA: 12/15/2014 PCP: [name], MD   Summary: 39 year old man with severe Crohn's disease, also cirrhosis with chronic thrombocytopenia and esophageal varices rehospitalize for persistent abdominal pain. He was discharged 1/1 and return to the emergency department 1/2 for persistent pain, generalized weakness, developing lower extremity edema and aspiration/shortness of breath after eating a grilled cheese sandwich. Abdominal x-ray was unremarkable and the patient refused CT scan. He was admitted for aspiration pneumonia, early sepsis, acute on chronic abdominal pain with concern for ileus, hypotension, concern for SBP? He rapidly improved and was narrowed to oral antibiotics for aspiration pneumonia.  Assessment/Plan: 1. Fever. Suspected superficial shoulder infection around gastrostomy tube site, this was recently replaced 12/29. 2. Aspiration pneumonia with suspected sepsis on admission. This appears to be resolved. Chest x-ray findings appear to be chronic and are likely atelectasis and chronic aspiration without acute infection. Maintain nothing by mouth status except for small sips with medications. 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 3. Crohn's disease with terminal ileal stricture, status post multiple dilatations. No evidence of obstruction clinically. 4. Nutrition. On TPN. Gastrostomy tube in place for decompression. 5. 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 6. Gastroparesis. Stable. Continue PPI, Carafate, low-dose Reglan 7. Anemia of chronic disease with iron deficiency. Hemoglobin stable. Next and thrombocytopenia secondary to underlying liver disease. 8. Chronic steroid therapy for Crohn's disease. 9. Anasarca. Very slowly improving with diuresis. Excellent diuresis. 10. Acute on Chronic abdominal pain, frozen abdomen. Poorly controlled. Patient  with poor sleep. Minimal effect with current regimen.   Plan empiric IV antibiotics, suspect oral absorption is poor. Pain seems to be stable. Monitor fever curve. Consider repeating imaging of abdomen within 48 hours it does not improve. Discussed with Dr. Karilyn Cota in detail.  Appreciate GI input. Continue to monitor closely. Thus far [facility] has felt the patient could be managed at this facility.  Increase IV Dilaudid. Decrease oral oxycodone. Not convinced he is absorbing much of this.  Code Status: full DVT prophylaxis: SCDs Family Communication: none present Disposition Plan: home  Brendia Sacks, MD  [facility]  Pager [phone] If 7PM-7AM, please contact night-coverage at [email or web address], password [facility] 12/21/2014, 6:52 PM  LOS: 6 days   Consultants:  Gastroenterology   physical therapy no needs  Occupational therapy no needs    Procedures:    Antibiotics:  Augmentin 1/5 >> 1/8  Unasyn 1/8 >>  Vancomycin 1/8 >>  Zosyn 1/2 >> 1/5  HPI/Subjective: Fever this morning. Purulent drainage from G-tube. No trouble breathing. No vomiting. Lower extremity edema has not changed although scrotal edema has resolved. He continues to have significant right-sided abdominal pain despite increased oxycodone. There has been no change in the severity of his pain. IV Dilaudid decrease his pain somewhat but does not work sufficiently to allow him to sleep. Continues to have flatus and several small bowel movements today.  Objective: Filed Vitals:   12/20/14 2225 12/21/14 0555 12/21/14 0645 12/21/14 1706  BP: 119/79 113/72  107/67  Pulse: 101 95  92  Temp: 98.4 F (36.9 C) 98.1 F (36.7 C) 101.3 F (38.5 C) 98.7 F (37.1 C)  TempSrc: Oral Oral Rectal Oral  Resp: Height:      Weight:  84.142 kg (185 lb 8 oz)    SpO2: 100% 100%  99%    Intake/Output Summary (Last 24 hours) at 12/21/14 1852 Last data filed at 12/21/14  1829  Gross per 24 hour  Intake   2270 ml    Output   3975 ml  Net  -1705 ml     Filed Weights   12/19/14 1854 12/20/14 0530 12/21/14 0555  Weight: 85.775 kg (189 lb 1.6 oz) 84.596 kg (186 lb 8 oz) 84.142 kg (185 lb 8 oz)    Exam:    Fever 101.3, VSS stable. No hypoxia. General:  Appears comfortable, calm. Cardiovascular: Regular rate and rhythm, no murmur, rub or gallop. 3+ bilateral lower extremity edema. Respiratory: Clear to auscultation bilaterally, no wheezes, rales or rhonchi. Normal respiratory effort. Abdomen: Unchanged. No cellulitis or purulent drainage noted at this time. G-tube in place. Psychiatric: grossly normal mood and affect, speech fluent and appropriate  Data Reviewed:  Urine output 3400  Basic metabolic panel unremarkable except for sodium 128, potassium 3.4. C. difficile PCR negative  Blood cultures, urine culture pending  Chest x-ray mild left base atelectasis, right base opacity somewhat increased, atelectasis versus pneumonia.  Pertinent data: Admission Labs  Hemoglobin 8.7  Urinalysis negative  Respiratory culture no growth, final. Normal oral pharyngeal flora.  Pending data:  Blood cultures no growth, 4 days  Blood cultures 1/8>>  Urine culture 1/8 >>  Scheduled Meds: . acidophilus  1 capsule Oral Daily  . ampicillin-sulbactam (UNASYN) IV  3 g Intravenous Q6H  . furosemide  40 mg Intravenous BID  . insulin aspart  0-20 Units Subcutaneous 4 times per day  . metoCLOPramide (REGLAN) injection  5 mg Intravenous 3 times per day  . oxyCODONE  20 mg Oral QID  . pantoprazole (PROTONIX) IV  40 mg Intravenous Q12H  . sodium chloride  10 mL Intravenous Q12H  . sodium chloride  3 mL Intravenous Q12H  . vancomycin  1,000 mg Intravenous Q8H  . zolpidem  10 mg Oral QHS   Continuous Infusions: . Marland KitchenTPN (CLINIMIX-E) Adult 80 mL/hr at 12/21/14 1725   And  . fat emulsion 240 mL (12/21/14 1725)    Principal Problem:   Aspiration pneumonia Active Problems:   Crohn's disease of both  small and large intestine with fistula   Chronic right lower quadrant pain   Hypertension   Liver cirrhosis   Gastroparesis   Aspiration into airway   Anasarca   Anemia of chronic disease   Time spent 25 minutes

## 2014-12-21 NOTE — Progress Notes (Signed)
Pt c/o increased pain and burning to g-tube site. Dressing removed and redness noted around site with purulent drainage. Order obtained for wound culture. Culture obtained and sent to lab.

## 2014-12-22 DIAGNOSIS — R509 Fever, unspecified: Secondary | ICD-10-CM

## 2014-12-22 LAB — CBC WITH DIFFERENTIAL/PLATELET
BASOS ABS: 0 10*3/uL (ref 0.0–0.1)
Basophils Relative: 1 % (ref 0–1)
EOS ABS: 0 10*3/uL (ref 0.0–0.7)
EOS PCT: 0 % (ref 0–5)
HEMATOCRIT: 27.1 % — AB (ref 39.0–52.0)
HEMOGLOBIN: 8.5 g/dL — AB (ref 13.0–17.0)
Lymphocytes Relative: 13 % (ref 12–46)
Lymphs Abs: 0.7 10*3/uL (ref 0.7–4.0)
MCH: 27.1 pg (ref 26.0–34.0)
MCHC: 31.4 g/dL (ref 30.0–36.0)
MCV: 86.3 fL (ref 78.0–100.0)
MONO ABS: 0.7 10*3/uL (ref 0.1–1.0)
MONOS PCT: 14 % — AB (ref 3–12)
NEUTROS ABS: 3.6 10*3/uL (ref 1.7–7.7)
Neutrophils Relative %: 72 % (ref 43–77)
Platelets: 70 10*3/uL — ABNORMAL LOW (ref 150–400)
RBC: 3.14 MIL/uL — ABNORMAL LOW (ref 4.22–5.81)
RDW: 20.9 % — AB (ref 11.5–15.5)
WBC: 5 10*3/uL (ref 4.0–10.5)

## 2014-12-22 LAB — BASIC METABOLIC PANEL
Anion gap: 4 — ABNORMAL LOW (ref 5–15)
BUN: 15 mg/dL (ref 6–23)
CO2: 32 mmol/L (ref 19–32)
Calcium: 7.2 mg/dL — ABNORMAL LOW (ref 8.4–10.5)
Chloride: 95 mEq/L — ABNORMAL LOW (ref 96–112)
Creatinine, Ser: 0.53 mg/dL (ref 0.50–1.35)
GFR calc Af Amer: >90 mL/min (ref >90)
GFR calc non Af Amer: >90 mL/min (ref >90)
GLUCOSE: 106 mg/dL — AB (ref 70–99)
Potassium: 2.9 mmol/L — ABNORMAL LOW (ref 3.5–5.1)
SODIUM: 131 mmol/L — AB (ref 135–145)

## 2014-12-22 LAB — URINE CULTURE
Colony Count: 2000
SPECIAL REQUESTS: NORMAL

## 2014-12-22 LAB — GLUCOSE, CAPILLARY
GLUCOSE-CAPILLARY: 98 mg/dL (ref 70–99)
Glucose-Capillary: 121 mg/dL — ABNORMAL HIGH (ref 70–99)
Glucose-Capillary: 125 mg/dL — ABNORMAL HIGH (ref 70–99)
Glucose-Capillary: 151 mg/dL — ABNORMAL HIGH (ref 70–99)
Glucose-Capillary: 161 mg/dL — ABNORMAL HIGH (ref 70–99)
Glucose-Capillary: 96 mg/dL (ref 70–99)

## 2014-12-22 MED ORDER — TRACE MINERALS CR-CU-F-FE-I-MN-MO-SE-ZN IV SOLN
INTRAVENOUS | Status: AC
  Administered 2014-12-22: 18:00:00 via INTRAVENOUS
  Filled 2014-12-22: qty 1920

## 2014-12-22 MED ORDER — POTASSIUM CHLORIDE 10 MEQ/100ML IV SOLN
10.0000 meq | INTRAVENOUS | Status: AC
  Administered 2014-12-22 (×3): 10 meq via INTRAVENOUS
  Filled 2014-12-22 (×4): qty 100

## 2014-12-22 MED ORDER — HYDROCORTISONE NA SUCCINATE PF 100 MG IJ SOLR
50.0000 mg | Freq: Every day | INTRAMUSCULAR | Status: DC
  Administered 2014-12-22 – 2014-12-31 (×10): 50 mg via INTRAVENOUS
  Filled 2014-12-22 (×10): qty 2

## 2014-12-22 MED ORDER — POTASSIUM CHLORIDE 10 MEQ/100ML IV SOLN
10.0000 meq | INTRAVENOUS | Status: DC
  Administered 2014-12-22 (×2): 10 meq via INTRAVENOUS
  Filled 2014-12-22 (×2): qty 100

## 2014-12-22 MED ORDER — PANTOPRAZOLE SODIUM 40 MG IV SOLR
40.0000 mg | Freq: Two times a day (BID) | INTRAVENOUS | Status: DC
  Administered 2014-12-22 – 2015-01-01 (×20): 40 mg via INTRAVENOUS
  Filled 2014-12-22 (×20): qty 40

## 2014-12-22 MED ORDER — FAT EMULSION 20 % IV EMUL
240.0000 mL | INTRAVENOUS | Status: AC
  Administered 2014-12-22: 240 mL via INTRAVENOUS
  Filled 2014-12-22: qty 250

## 2014-12-22 MED ORDER — POTASSIUM CHLORIDE 10 MEQ/100ML IV SOLN
10.0000 meq | Freq: Once | INTRAVENOUS | Status: AC
  Administered 2014-12-22: 10 meq via INTRAVENOUS

## 2014-12-22 NOTE — Progress Notes (Signed)
PARENTERAL NUTRITION CONSULT NOTE  Pharmacy Consult for TPN Indication: prolonged ileus  Allergies  Allergen Reactions  . Remicade [Infliximab]     Dr Karilyn Cota states previous respiratory arrest not related to remicade. Dr Karilyn Cota states remicade not a drug related allergy. 07-07-2013 at 1025 rapid response called to PACU- Patient difficulty breathing after infusion of Remicade infusing. Do NOT Give Remicade!  . Fentanyl And Related Itching    Swelling, Redness  . Alfentanil Itching    Swelling, Redness  . Wellbutrin [Bupropion] Nausea And Vomiting  . Morphine And Related Hives   Patient Measurements: Height: 5\' 11"  (180.3 cm) Weight: 180 lb 4.8 oz (81.784 kg) IBW/kg (Calculated) : 75.3  Usual body weight ~ 100kg  Vital Signs: Temp: 99.2 F (37.3 C) (01/09 0603) Temp Source: Oral (01/09 0603) BP: 102/65 mmHg (01/09 0603) Pulse Rate: 89 (01/09 0603) Intake/Output from previous day: 01/08 0701 - 01/09 0700 In: 2520 [P.O.:720; I.V.:20; IV Piggyback:700; TPN:1080] Out: 3825 [Urine:3825] Intake/Output from this shift:    Labs:  Recent Labs  12/20/14 0551 12/21/14 0624 12/22/14 0601  WBC 6.3  --  5.0  HGB 8.1*  --  8.5*  HCT 26.5*  --  27.1*  PLT 75*  --  70*  INR  --  1.24  --      Recent Labs  12/20/14 0551 12/21/14 0956 12/22/14 0601  NA 132* 128* 131*  K 3.9 3.4* 2.9*  CL 99 94* 95*  CO2 29 30 32  GLUCOSE 114* 91 106*  BUN 17 15 15   CREATININE 0.54 0.51 0.53  CALCIUM 7.5* 7.3* 7.2*  MG 1.4*  --   --   PHOS 2.6  --   --   PROT 4.3*  --   --   ALBUMIN 2.3*  --   --   AST 44*  --   --   ALT 67*  --   --   ALKPHOS 111  --   --   BILITOT 0.8  --   --    Estimated Creatinine Clearance: 133.3 mL/min (by C-G formula based on Cr of 0.53).    Recent Labs  12/21/14 1722 12/22/14 0004 12/22/14 0601  GLUCAP 104* 96 121*   Medical History: Past Medical History  Diagnosis Date  . 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 Crohn's disease with fistula 05/04/2011     both large and small intestinges/notes 11/15/2012  . Hepatomegaly 05/04/2011  . Fatty liver 05/04/2011    "stage III fatty liver fibrosis" (11/15/2012)  . GERD (gastroesophageal reflux disease)   . Chronic liver disease     /notes 11/15/2012  . Pericarditis     [name] 11/15/2012  . Hypertension   . Pneumonia 1977  . Shortness of breath     "all the time right now" (11/15/2012)  . History of blood transfusion 2004  . History of stomach ulcers   . Duodenal ulcer   . Depression   . Kidney stones     bilaterally/notes 11/15/2012  . Hepatic fibrosis     [name] 11/15/2012  . Anemia   . Pericardial effusion 10/29/2012    moderate to large/notes 11/15/2012  . Anxiety   . Hepatitis   . Crohn's disease   . ED (erectile dysfunction)   . Cirrhosis     secondary to drug effect, +/- fatty liver   Insulin Requirements in the past 24 hours:  0 units  Current Nutrition:  TPN  Assessment: Brief narrative: 39 y/o ? hx crohns diag ~2002, symptoms  since age 4 s/p Gastrojejunostomy 2004 complicated open abd + Mesh, Cirrhosis 2/2 to MTX use? 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 States that he just been seen at [facility] 12/05/14 to change out his PEG tube to a larger size but was not done due to low BP.  Was sent home to be with family over holidays. Over that period of time he progressively got weaker and weaker and felt more ill or nauseous and was unable to keep anything down.  He was hospitalized at [facility] 12/27-31 and discharged with TPN but was readmitted due to choking and possible aspiration after eating at home.   Evaluation at [facility] for liver/colon transplant is pending.   Na chronically low but stable.  K+ low today  CBGs in goal range.  24hr I/O -  Estimated Nutritional Needs: Kcal: 2100-2300 Protein: 96-106 grams Fluid: 2.1-2.3 L  Plan:    Continue TPN with Clinimix E 5/15 at 22ml/hr   Provide lipids, trace elements, and MVI with TPN  Continue sliding scale insulin with CBG checks q6hrs  IV KCl runs x 6 today  Monitor lytes, triglycerides, glucose tolerance, renal fxn, and fluid status  David [name] 12/22/2014,8:19 AM

## 2014-12-22 NOTE — Progress Notes (Signed)
PROGRESS NOTE  David [name] ZOX:[id number] DOB: 06/05/1976 DOA: 12/15/2014 PCP: [name], MD   Summary: 39 year old man with severe Crohn's disease, also cirrhosis with chronic thrombocytopenia and esophageal varices rehospitalize for persistent abdominal pain. He was discharged 1/1 and return to the emergency department 1/2 for persistent pain, generalized weakness, developing lower extremity edema and aspiration/shortness of breath after eating a grilled cheese sandwich. Abdominal x-ray was unremarkable and the patient refused CT scan. He was admitted for aspiration pneumonia, early sepsis, acute on chronic abdominal pain with concern for ileus, hypotension. He rapidly improved and was narrowed to oral antibiotics for aspiration pneumonia. Main issue was pain control. Continued to have bowel movements and passes flatus, no evidence of obstruction. Developed fever 1/8 and purulent drainage around G-tube site suspicious for superficial infection. GI has been in conversation with physicians at [facility] who thus far have felt the patient should be managed at [facility].  Assessment/Plan: 1. Fever. Suspected superficial wound infection around gastrostomy tube site, this was recently replaced 12/29. Clinically stable. 2. Aspiration pneumonia with suspected sepsis on admission. Resolved clinically, though not radiographically, he does have a history of chronic aspiration findings on chest x-ray. No hypoxia or respiratory symptoms. Maintain NPO except for small sips with medications. 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 3. Crohn's disease with terminal ileal stricture, status post multiple dilatations. Pain control improved. Bowels moving. 4. Nutrition. On TPN. Gastrostomy tube in place for decompression. 5. 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 6. Gastroparesis. Stable. Continue PPI, Carafate, low-dose Reglan 7. Anemia of chronic disease  with iron deficiency, thrombocytopenia. Hemoglobin and platelet count remained stable. 8. Chronic steroid therapy for Crohn's disease. 9. Anasarca. Very slow to improve . Excellent diuresis. 10. Acute on Chronic abdominal pain, frozen abdomen. Pain now controlled with IV Dilaudid, question absorption of oral narcotic.   Plan empiric IV antibiotics, suspect oral absorption is poor. Appears clinically better. Monitor fever curve. Consider repeating imaging of abdomen within 48 hours it does not improve, but hold off with now with improved pain and bowels moving.  Appreciate GI input. Continue to monitor closely. Thus far [facility] has felt the patient could be managed at this facility.  Continue IV Dilaudid, Lasix.   Code Status: full DVT prophylaxis: SCDs Family Communication: none present; offered to contact family but he prefers to relay information himself. Disposition Plan: home  Brendia Sacks, MD  [facility]  Pager [phone] If 7PM-7AM, please contact night-coverage at [email or web address], password [facility] 12/22/2014, 10:35 AM  LOS: 7 days   Consultants:  Gastroenterology   physical therapy no needs  Occupational therapy no needs    Procedures:    Antibiotics:  Unasyn 1/8 >>  Vancomycin 1/8 >>  Zosyn 1/2 >> 1/5  Augmentin 1/5 >> 1/8  HPI/Subjective: Febrile again overnight.  "Better". Pain is now controlled. No vomiting, breathing fine. Insignificant output from G-tube sit. Slight improvement in LE edema. Bowel movements continue.  Objective: Filed Vitals:   12/21/14 2124 12/21/14 2237 12/22/14 0050 12/22/14 0603  BP: 101/67   102/65  Pulse: 107   89  Temp: 101.9 F (38.8 C) 101 F (38.3 C) 99.9 F (37.7 C) 99.2 F (37.3 C)  TempSrc: Oral   Oral  Resp: 20   20  Height:      Weight:    81.784 kg (180 lb 4.8 oz)  SpO2: 100%   96%    Intake/Output Summary (Last 24 hours) at 12/22/14 1035 Last  data filed at 12/22/14 1029  Gross per 24 hour  Intake    2530 ml  Output   3850 ml  Net  -1320 ml     Filed Weights   12/20/14 0530 12/21/14 0555 12/22/14 0603  Weight: 84.596 kg (186 lb 8 oz) 84.142 kg (185 lb 8 oz) 81.784 kg (180 lb 4.8 oz)    Exam:    Fever 101.3, VSS stable. No hypoxia. General:  Appears calm and comfortable today Cardiovascular: RRR, no m/r/g. 3+ pitting BLE edema, perhaps slight improvement. Respiratory: CTA bilaterally, no w/r/r. Normal respiratory effort. Abdomen: soft, G-tube in place, no change on exam. Psychiatric: grossly normal mood and affect, speech fluent and appropriate Neurologic: grossly non-focal.  Data Reviewed:  Urine output 3825. -4.865 L since admission.  Potassium 2.9. BUN and creatinine normal. Sodium improved, 131.  No leukocytosis. Hemoglobin stable 8.5. Platelet count stable 70.  Blood cultures, urine culture pending  Chest x-ray mild left base atelectasis, right base opacity somewhat increased, atelectasis versus pneumonia.  Pertinent data: Labs  Respiratory culture no growth, final. Normal oral pharyngeal flora.  Chest x-ray: mild left base atelectasis, right base opacity somewhat increased, atelectasis versus pneumonia.  Pending data:  Blood cultures no growth, 4 days  Blood cultures 1/8>>  Urine culture 1/8 >>  Wound culture >>  Scheduled Meds: . acidophilus  1 capsule Oral Daily  . ampicillin-sulbactam (UNASYN) IV  3 g Intravenous Q6H  . furosemide  40 mg Intravenous BID  . insulin aspart  0-20 Units Subcutaneous 4 times per day  . metoCLOPramide (REGLAN) injection  5 mg Intravenous 3 times per day  . oxyCODONE  20 mg Oral QID  . pantoprazole (PROTONIX) IV  40 mg Intravenous Q12H  . potassium chloride  10 mEq Intravenous Q1 Hr x 6  . sodium chloride  10 mL Intravenous Q12H  . sodium chloride  3 mL Intravenous Q12H  . vancomycin  1,000 mg Intravenous Q8H  . zolpidem  10 mg Oral QHS   Continuous Infusions: . Marland KitchenTPN (CLINIMIX-E) Adult 80 mL/hr at 12/21/14 1725    And  . fat emulsion 240 mL (12/21/14 1725)  . Marland KitchenTPN (CLINIMIX-E) Adult     And  . fat emulsion      Principal Problem:   Aspiration pneumonia Active Problems:   Crohn's disease of both small and large intestine with fistula   Chronic right lower quadrant pain   Hypertension   Liver cirrhosis   Gastroparesis   Aspiration into airway   Anasarca   Anemia of chronic disease   Time spent 25 minutes

## 2014-12-22 NOTE — Progress Notes (Signed)
Patient ID: David [name], male   DOB: 22-Dec-1975, 39 y.o.   MRN: [id number]   Assessment/Plan: ADMITTED WITH SBO AND CLINICALLY IMPROVED. NOW SPOKING FEVERS-CX NEG. PEG SITE WITH PURULENT DRAINAGE. PT NOW ON VANC AND UNASYN. WAS ON STEROIDS AS AN OUTPATIENT AND EARLIER IN HIS HOSPITAL ADMISSION.  PLAN: 1. CONSIDER HCT TO PREVENT ADRENAL CRISIS 2. MONITOR PEG SITE 3. AWAIT PENDING CULTURES   Subjective: Since I last evaluated the patient HE HAS SPIKED A TEMP TP 101.87f. SOFT BM THIS AM. PAIN BETTER TODAY. ABD STILL DISTENDED. FEELS PEG SITE DRAINAGE IS BETTER.  Objective: Vital signs in last 24 hours: Filed Vitals:   12/22/14 0603  BP: 102/65  Pulse: 89  Temp: 99.2 F (37.3 C)  Resp: 20   General appearance: alert, cooperative and no distress Resp: clear to auscultation bilaterally Cardio: regular rate and rhythm GI: soft, MILD TO MODERATE TTP  X4, NO REBOUND, DISTENDED, MIDLINE INCISION WELL HEALED. PEG IN MID ABD(PURULENT D/C ON DRAINAGE, MILD ERYTHEMA AROUND PEG TUBE.  Lab Results: CULTURES: BCx NEG & FINAL JAN 2. JAN 8 BLDCx PENDING, U Cx PENDING, C DIFF PCR NEG, pCXR: ATELECTASIS V. PNA, U/S JAN 3-NO ASCITES  Studies/Results: Dg Chest Port 1 View  12/21/2014   CLINICAL DATA:  Infection.  Fever.  EXAM: PORTABLE CHEST - 1 VIEW  COMPARISON:  December 19, 2014.  FINDINGS: The heart size and mediastinal contours are within normal limits. Hypoinflation of the lungs is noted. Mild left basilar subsegmental atelectasis is noted. Increased opacity is seen in the right lung base suggesting subsegmental atelectasis or possibly pneumonia. Stable elevated left hemidiaphragm. Right-sided PICC line is unchanged in position with distal tip overlying expected position of the SVC. No pneumothorax or pleural effusion is noted. The visualized skeletal structures are unremarkable.  IMPRESSION: Mild left basilar subsegmental atelectasis. Increased right basilar opacity is noted concerning for  subsegmental atelectasis or possibly developing pneumonia. Followup radiographs are recommended.   Electronically Signed   By: Roque Lias M.D.   On: 12/21/2014 09:59    Medications: I have reviewed the patient's current medications.   LOS: 5 days   Jonette Eva 05/24/2014, 2:23 PM

## 2014-12-22 NOTE — Progress Notes (Signed)
Notified by NT that patients temp orally was 102.3. NT took patients temp rectally and it was 103.9. MD was notified. Patient was alert and oriented and had no complaints at this time. Patient received tylenol @ 1555. Patients temp was rechecked and was 99.7 orally. Will continue to monitor patient at this time.

## 2014-12-23 ENCOUNTER — Inpatient Hospital Stay (HOSPITAL_COMMUNITY): Payer: Medicare Other

## 2014-12-23 DIAGNOSIS — R1084 Generalized abdominal pain: Secondary | ICD-10-CM | POA: Insufficient documentation

## 2014-12-23 DIAGNOSIS — B999 Unspecified infectious disease: Secondary | ICD-10-CM | POA: Insufficient documentation

## 2014-12-23 LAB — BASIC METABOLIC PANEL
Anion gap: 4 — ABNORMAL LOW (ref 5–15)
BUN: 15 mg/dL (ref 6–23)
CALCIUM: 7 mg/dL — AB (ref 8.4–10.5)
CO2: 33 mmol/L — AB (ref 19–32)
Chloride: 93 mEq/L — ABNORMAL LOW (ref 96–112)
Creatinine, Ser: 0.5 mg/dL (ref 0.50–1.35)
GFR calc non Af Amer: >90 mL/min (ref >90)
GLUCOSE: 132 mg/dL — AB (ref 70–99)
Potassium: 3 mmol/L — ABNORMAL LOW (ref 3.5–5.1)
Sodium: 130 mmol/L — ABNORMAL LOW (ref 135–145)

## 2014-12-23 LAB — GLUCOSE, CAPILLARY
GLUCOSE-CAPILLARY: 137 mg/dL — AB (ref 70–99)
Glucose-Capillary: 153 mg/dL — ABNORMAL HIGH (ref 70–99)
Glucose-Capillary: 111 mg/dL — ABNORMAL HIGH (ref 70–99)

## 2014-12-23 LAB — VANCOMYCIN, TROUGH: VANCOMYCIN TR: 16.8 ug/mL (ref 10.0–20.0)

## 2014-12-23 LAB — MAGNESIUM: Magnesium: 1.6 mg/dL (ref 1.5–2.5)

## 2014-12-23 MED ORDER — POTASSIUM CHLORIDE 10 MEQ/100ML IV SOLN
10.0000 meq | INTRAVENOUS | Status: AC
  Administered 2014-12-23 (×5): 10 meq via INTRAVENOUS
  Filled 2014-12-23 (×4): qty 100

## 2014-12-23 MED ORDER — POTASSIUM CHLORIDE 10 MEQ/100ML IV SOLN
10.0000 meq | INTRAVENOUS | Status: DC
  Administered 2014-12-23: 10 meq via INTRAVENOUS
  Filled 2014-12-23 (×2): qty 100

## 2014-12-23 MED ORDER — FAT EMULSION 20 % IV EMUL
240.0000 mL | INTRAVENOUS | Status: AC
  Administered 2014-12-23: 240 mL via INTRAVENOUS
  Filled 2014-12-23: qty 250

## 2014-12-23 MED ORDER — IOHEXOL 300 MG/ML  SOLN
100.0000 mL | Freq: Once | INTRAMUSCULAR | Status: AC | PRN
  Administered 2014-12-23: 100 mL via INTRAVENOUS

## 2014-12-23 MED ORDER — TRACE MINERALS CR-CU-F-FE-I-MN-MO-SE-ZN IV SOLN
INTRAVENOUS | Status: AC
  Administered 2014-12-23: 18:00:00 via INTRAVENOUS
  Filled 2014-12-23: qty 1920

## 2014-12-23 MED ORDER — PIPERACILLIN-TAZOBACTAM 3.375 G IVPB
3.3750 g | Freq: Three times a day (TID) | INTRAVENOUS | Status: DC
  Administered 2014-12-23 – 2014-12-26 (×9): 3.375 g via INTRAVENOUS
  Filled 2014-12-23 (×12): qty 50

## 2014-12-23 NOTE — Progress Notes (Signed)
PARENTERAL NUTRITION CONSULT NOTE  Pharmacy Consult for TPN Indication: prolonged ileus  Allergies  Allergen Reactions  . Remicade [Infliximab]     Dr Karilyn Cota states previous respiratory arrest not related to remicade. Dr Karilyn Cota states remicade not a drug related allergy. 07-07-2013 at 1025 rapid response called to PACU- Patient difficulty breathing after infusion of Remicade infusing. Do NOT Give Remicade!  . Fentanyl And Related Itching    Swelling, Redness  . Alfentanil Itching    Swelling, Redness  . Wellbutrin [Bupropion] Nausea And Vomiting  . Morphine And Related Hives   Patient Measurements: Height:  (180.3 cm) Weight: 185 lb 8 oz (84.142 kg) IBW/kg (Calculated) : 75.3  Usual body weight ~ 100kg  Vital Signs: Temp: 97.7 F (36.5 C) (01/10 0543) Temp Source: Oral (01/10 0543) BP: 92/53 mmHg (01/10 0543) Pulse Rate: 65 (01/10 0543) Intake/Output from previous day: 01/09 0701 - 01/10 0700 In: 1590 [I.V.:10; IV Piggyback:500; TPN:1080] Out: 3476 [Urine:3475; Stool:1] Intake/Output from this shift: Total I/O In: -  Out: 2125 [Urine:225; Drains:1900]  Labs:  Recent Labs  12/21/14 0624 12/22/14 0601  WBC  --  5.0  HGB  --  8.5*  HCT  --  27.1*  PLT  --  70*  INR 1.24  --      Recent Labs  12/21/14 0956 12/22/14 0601 12/23/14 0612  NA 128* 131* 130*  K 3.4* 2.9* 3.0*  CL 94* 95* 93*  CO2 30 32 33*  GLUCOSE 91 106* 132*  BUN CREATININE 0.51 0.53 0.50  CALCIUM 7.3* 7.2* 7.0*  MG  --   --  1.6   Estimated Creatinine Clearance: 133.3 mL/min (by C-G formula based on Cr of 0.5).    Recent Labs  12/22/14 1807 12/22/14 2347 12/23/14 0550  GLUCAP 151* 161* 153*   Medical History: Past Medical History  Diagnosis Date  . 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 Hepatomegaly 05/04/2011  . Fatty liver 05/04/2011    "stage III fatty liver fibrosis"  (11/15/2012)  . GERD (gastroesophageal reflux disease)   . Chronic liver disease     /notes 11/15/2012  . Pericarditis     [name] 11/15/2012  . Hypertension   . Pneumonia 1977  . Shortness of breath     "all the time right now" (11/15/2012)  . History of blood transfusion 2004  . History of stomach ulcers   . Duodenal ulcer   . Depression   . Kidney stones     bilaterally/notes 11/15/2012  . Hepatic fibrosis     [name] 11/15/2012  . Anemia   . Pericardial effusion 10/29/2012    moderate to large/notes 11/15/2012  . Anxiety   . Hepatitis   . Crohn's disease   . ED (erectile dysfunction)   . Cirrhosis     secondary to drug effect, +/- fatty liver   Insulin Requirements in the past 24 hours:  9 units  Current Nutrition:  TPN  Assessment: Brief narrative: 39 y/o ? hx crohns diag ~2002, symptoms since age 89 s/p Gastrojejunostomy 2004 complicated open abd + Mesh, Cirrhosis 2/2 to MTX use? 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 States that he just been seen at [facility] 12/05/14 to change out his PEG tube to a larger size but  was not done due to low BP.  Was sent home to be with family over holidays. Over that period of time he progressively got weaker and weaker and felt more ill or nauseous and was unable to keep anything down.  He was hospitalized at [facility] 12/27-31 and discharged with TPN but was readmitted due to choking and possible aspiration after eating at home.   Evaluation at [facility] for liver/colon transplant is pending.   Na chronically low but stable.  K+ remains low today despite K+ runs yesterday. CBGs 150-160 with addition of Solu-Cortef yesterday and correction insulin given.  24hr I/O -  Estimated Nutritional Needs: Kcal: 2100-2300 Protein: 96-106 grams Fluid: 2.1-2.3 L  Plan:   Continue TPN with Clinimix E 5/15 at 33ml/hr    Provide lipids, trace elements, and MVI with TPN  Continue sliding scale insulin with CBG checks q6hrs  IV KCl runs x 6 today  Monitor lytes, triglycerides, glucose tolerance, renal fxn, and fluid status  Wilmina Maxham, [name] 12/23/2014,9:17 AM

## 2014-12-23 NOTE — Progress Notes (Signed)
PROGRESS NOTE  David [name] ZOX:[id number] DOB: 09/01/1976 DOA: 12/15/2014 PCP: [name], MD   Summary: 39 year old man with severe Crohn's disease, also cirrhosis with chronic thrombocytopenia and esophageal varices rehospitalize for persistent abdominal pain. He was discharged 1/1 and return to the emergency department 1/2 for persistent pain, generalized weakness, developing lower extremity edema and aspiration/shortness of breath after eating a grilled cheese sandwich. Abdominal x-ray was unremarkable and the patient refused CT scan. He was admitted for aspiration pneumonia, early sepsis, acute on chronic abdominal pain with concern for ileus, hypotension. He rapidly improved and was narrowed to oral antibiotics for aspiration pneumonia. Main issue was pain control. Continued to have bowel movements and passes flatus, no evidence of obstruction. Developed fever 1/8 and purulent drainage around G-tube site suspicious for superficial infection. GI has been in conversation with physicians at [facility] who thus far have felt the patient should be managed at [facility].  Assessment/Plan: 1. Fever with cellulitis/infection around gastrostomy tube. Now afebrile almost 24 hours. Blood cultures are pending, no growth to date. Plan for CT abdomen today per GI to further assess. He appears clinically stable. 2. Aspiration pneumonia with suspected sepsis on admission. Resolved clinically, though not radiographically, he does have a history of chronic aspiration findings on chest x-ray. No hypoxia or respiratory symptoms. Maintain NPO except for small sips with medications. 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 3. Crohn's disease with terminal ileal stricture, status post multiple dilatations. Pain controlled. Bowels continue to move. 4. Nutrition. On TPN. Gastrostomy tube in place for decompression. 5. 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 6. Gastroparesis. Stable. Continue PPI, Carafate, low-dose Reglan 7. Anemia of chronic disease with iron deficiency, thrombocytopenia. Hemoglobin and platelet count remained stable. 8. Chronic steroid therapy for Crohn's disease. 9. Anasarca. Puzzling. He has excellent diuresis over 3 L per day, now down many liters since admission and yet no change in anasarca. Kidney function is preserved. 10. Acute on chronic abdominal pain, frozen abdomen. Pain controlled with IV Dilaudid, question absorption of oral narcotic.   Overall he appears to be stable. Plan for CT today to assess for complicating features in regard to infection around gastrostomy tube site. Plan to continue empiric antibiotics and follow-up culture data.  Continue pain control with Dilaudid. It may be difficult to wean off IV medication.  She remains guarded.  Code Status: full DVT prophylaxis: SCDs Family Communication: none present; offered to contact family but he prefers to relay information himself. Disposition Plan: home  Brendia Sacks, MD  [facility]  Pager [phone] If 7PM-7AM, please contact night-coverage at [email or web address], password [facility] 12/23/2014, 11:31 AM  LOS: 8 days   Consultants:  Gastroenterology   physical therapy no needs  Occupational therapy no needs    Procedures:    Antibiotics:  Unasyn 1/8 >>  Vancomycin 1/8 >>  Zosyn 1/2 >> 1/5  Augmentin 1/5 >> 1/8  HPI/Subjective: Febrile yesterday 1532. Continues to have purulent drainage around G-tube site.  Overall he is feeling about the same. Pain is well controlled currently. He continues to have some drainage around the PEG tube site. He continues to have significant bilateral lower extremity edema has some edema in his right arm.  Objective: Filed Vitals:   12/22/14 1639 12/22/14 1837 12/22/14 2032 12/23/14 0543  BP:   105/70 92/53  Pulse:   98 65  Temp: 99.7 F (37.6 C) 99.7 F (37.6 C) 98.6 F (37 C) 97.7  F (36.5  C)  TempSrc: Oral Oral Oral Oral  Resp:   20 20  Height:      Weight:    84.142 kg (185 lb 8 oz)  SpO2:   97% 100%    Intake/Output Summary (Last 24 hours) at 12/23/14 1131 Last data filed at 12/23/14 0739  Gross per 24 hour  Intake   1580 ml  Output   4501 ml  Net  -2921 ml     Filed Weights   12/21/14 0555 12/22/14 0603 12/23/14 0543  Weight: 84.142 kg (185 lb 8 oz) 81.784 kg (180 lb 4.8 oz) 84.142 kg (185 lb 8 oz)    Exam:    Fever 103.9, VSS stable. No hypoxia, ill but not toxic. Cardiovascular: Regular rate and rhythm, no murmur, rub or gallop. 3+ bilateral lower extremity edema without apparent change. Mild edema of the right upper arm. Respiratory: Clear to auscultation bilaterally, no wheezes, rales or rhonchi. Normal respiratory effort. Abdomen: PEG tube site with some mild surrounding erythema which appears to be skin irritation rather than cellulitis. There is a greenish purulent discharge around the PEG tube. Psychiatric: grossly normal mood and affect, speech fluent and appropriate  Data Reviewed:  Urine output 3476.  -6.75 L since admission.  Capillary blood sugar stable  Potassium 3.0.  Pertinent data: Labs  Respiratory culture no growth, final. Normal oral pharyngeal flora.  Chest x-ray: mild left base atelectasis, right base opacity somewhat increased, atelectasis versus pneumonia.  Urine culture 1/8 >> insignificant growth  Pending data:  Blood cultures no growth, 4 days  Blood cultures 1/8>>  Wound culture >>  Scheduled Meds: . acidophilus  1 capsule Oral Daily  . ampicillin-sulbactam (UNASYN) IV  3 g Intravenous Q6H  . furosemide  40 mg Intravenous BID  . hydrocortisone sod succinate (SOLU-CORTEF) inj  50 mg Intravenous Daily  . insulin aspart  0-20 Units Subcutaneous 4 times per day  . metoCLOPramide (REGLAN) injection  5 mg Intravenous 3 times per day  . oxyCODONE  20 mg Oral QID  . pantoprazole (PROTONIX) IV  40 mg  Intravenous BID  . potassium chloride  10 mEq Intravenous Q1 Hr x 6  . sodium chloride  10 mL Intravenous Q12H  . sodium chloride  3 mL Intravenous Q12H  . vancomycin  1,000 mg Intravenous Q8H  . zolpidem  10 mg Oral QHS   Continuous Infusions: . Marland KitchenTPN (CLINIMIX-E) Adult 80 mL/hr at 12/22/14 1815   And  . fat emulsion 240 mL (12/22/14 1815)  . Marland KitchenTPN (CLINIMIX-E) Adult     And  . fat emulsion      Principal Problem:   Aspiration pneumonia Active Problems:   Crohn's disease of both small and large intestine with fistula   Chronic right lower quadrant pain   Hypertension   Liver cirrhosis   Gastroparesis   Aspiration into airway   Anasarca   Anemia of chronic disease   Time spent 20 minutes

## 2014-12-23 NOTE — Progress Notes (Signed)
ANTIBIOTIC CONSULT NOTE  Pharmacy Consult for Vancomycin/Zosyn Indication: wound infection  Allergies  Allergen Reactions  . Remicade [Infliximab]     Dr Karilyn Cota states previous respiratory arrest not related to remicade. Dr Karilyn Cota states remicade not a drug related allergy. 07-07-2013 at 1025 rapid response called to PACU- Patient difficulty breathing after infusion of Remicade infusing. Do NOT Give Remicade!  . Fentanyl And Related Itching    Swelling, Redness  . Alfentanil Itching    Swelling, Redness  . Wellbutrin [Bupropion] Nausea And Vomiting  . Morphine And Related Hives    Patient Measurements: Height:  (180.3 cm) Weight: 185 lb 8 oz (84.142 kg) IBW/kg (Calculated) : 75.3  Vital Signs: Temp: 97.7 F (36.5 C) (01/10 0543) Temp Source: Oral (01/10 0543) BP: 92/53 mmHg (01/10 0543) Pulse Rate: 65 (01/10 0543) Intake/Output from previous day: 01/09 0701 - 01/10 0700 In: 1590 [I.V.:10; IV Piggyback:500; TPN:1080] Out: 3476 [Urine:3475; Stool:1] Intake/Output from this shift: Total I/O In: -  Out: 2775 [Urine:875; Drains:1900]  Labs:  Recent Labs  12/21/14 0956 12/22/14 0601 12/23/14 0612  WBC  --  5.0  --   HGB  --  8.5*  --   PLT  --  70*  --   CREATININE 0.51 0.53 0.50   Estimated Creatinine Clearance: 133.3 mL/min (by C-G formula based on Cr of 0.5).  Recent Labs  12/23/14 1120  VANCOTROUGH 16.8     Microbiology: Recent Results (from the past 720 hour(s))  Blood culture (routine x 2)     Status: None   Collection Time: 12/09/14  5:36 PM  Result Value Ref Range Status   Specimen Description BLOOD RIGHT ANTECUBITAL  Final   Special Requests BOTTLES DRAWN AEROBIC AND ANAEROBIC 10CC  Final   Culture NO GROWTH 6 DAYS  Final   Report Status 12/15/2014 FINAL  Final  Blood culture (routine x 2)     Status: None   Collection Time: 12/09/14  5:45 PM  Result Value Ref Range Status   Specimen Description BLOOD LEFT HAND  Final   Special Requests  BOTTLES DRAWN AEROBIC ONLY 5CC  Final   Culture NO GROWTH 6 DAYS  Final   Report Status 12/15/2014 FINAL  Final  Blood culture (routine x 2)     Status: None   Collection Time: 12/15/14  2:09 PM  Result Value Ref Range Status   Specimen Description BLOOD PORTA CATH DRAWN BY RN  Final   Special Requests BOTTLES DRAWN AEROBIC ONLY 6CC  Final   Culture NO GROWTH 5 DAYS  Final   Report Status 12/20/2014 FINAL  Final  Blood culture (routine x 2)     Status: None   Collection Time: 12/15/14  2:15 PM  Result Value Ref Range Status   Specimen Description BLOOD LEFT ANTECUBITAL  Final   Special Requests BOTTLES DRAWN AEROBIC ONLY 6CC  Final   Culture NO GROWTH 5 DAYS  Final   Report Status 12/20/2014 FINAL  Final  MRSA PCR Screening     Status: None   Collection Time: 12/15/14  6:00 PM  Result Value Ref Range Status   MRSA by PCR NEGATIVE NEGATIVE Final    Comment:        The GeneXpert MRSA Assay (FDA approved for NASAL specimens only), is one component of a comprehensive MRSA colonization surveillance program. It is not intended to diagnose MRSA infection nor to guide or monitor treatment for MRSA infections.   Culture, respiratory (NON-Expectorated)  Status: None   Collection Time: 12/15/14  8:00 PM  Result Value Ref Range Status   Specimen Description SPUTUM  Final   Special Requests NONE  Final   Gram Stain   Final    ABUNDANT WBC PRESENT, PREDOMINANTLY PMN NO SQUAMOUS EPITHELIAL CELLS SEEN MODERATE YEAST FEW GRAM NEGATIVE RODS FEW GRAM POSITIVE RODS    Culture   Final    NORMAL OROPHARYNGEAL FLORA Performed at [facility]    Report Status 12/18/2014 FINAL  Final  Wound culture     Status: None (Preliminary result)   Collection Time: 12/21/14  6:44 AM  Result Value Ref Range Status   Specimen Description G/T SITE  Final   Special Requests Normal  Final   Gram Stain   Final    FEW WBC PRESENT,BOTH PMN AND MONONUCLEAR NO SQUAMOUS EPITHELIAL CELLS SEEN NO  ORGANISMS SEEN Performed at [facility]    Culture   Final    Culture reincubated for better growth Performed at [facility]    Report Status PENDING  Incomplete  Culture, Urine     Status: None   Collection Time: 12/21/14  7:35 AM  Result Value Ref Range Status   Specimen Description URINE, CLEAN CATCH  Final   Special Requests Normal  Final   Colony Count   Final    2,000 COLONIES/ML Performed at [facility]    Culture   Final    INSIGNIFICANT GROWTH Performed at [facility]    Report Status 12/22/2014 FINAL  Final  Culture, blood (routine x 2)     Status: None (Preliminary result)   Collection Time: 12/21/14  8:26 AM  Result Value Ref Range Status   Specimen Description BLOOD LEFT ARM  Final   Special Requests BOTTLES DRAWN AEROBIC AND ANAEROBIC 10CC BOTTLES  Final   Culture NO GROWTH 1 DAY  Final   Report Status PENDING  Incomplete  Culture, blood (routine x 2)     Status: None (Preliminary result)   Collection Time: 12/21/14  8:31 AM  Result Value Ref Range Status   Specimen Description BLOOD LEFT HAND  Final   Special Requests BOTTLES DRAWN AEROBIC ONLY 6CC BOTTLE  Final   Culture NO GROWTH 1 DAY  Final   Report Status PENDING  Incomplete  Clostridium Difficile by PCR     Status: None   Collection Time: 12/21/14  1:56 PM  Result Value Ref Range Status   C difficile by pcr NEGATIVE NEGATIVE Final    Medical History: Past Medical History  Diagnosis Date  . 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 Hepatomegaly 05/04/2011  . Fatty liver 05/04/2011    "stage III fatty liver fibrosis" (11/15/2012)  . GERD (gastroesophageal reflux disease)   . Chronic liver disease     /notes 11/15/2012  . Pericarditis     [name] 11/15/2012  . Hypertension   . Pneumonia 1977  . Shortness of breath     "all the time right now" (11/15/2012)  . History of blood  transfusion 2004  . History of stomach ulcers   . Duodenal ulcer   . Depression   . Kidney stones     bilaterally/notes 11/15/2012  . Hepatic fibrosis     [name] 11/15/2012  . Anemia   . Pericardial effusion 10/29/2012    moderate to large/notes 11/15/2012  . Anxiety   . Hepatitis   .  Crohn's disease   . ED (erectile dysfunction)   . 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 acidophilus  1 capsule Oral Daily  . furosemide  40 mg Intravenous BID  . hydrocortisone sod succinate (SOLU-CORTEF) inj  50 mg Intravenous Daily  . insulin aspart  0-20 Units Subcutaneous 4 times per day  . metoCLOPramide (REGLAN) injection  5 mg Intravenous 3 times per day  . oxyCODONE  20 mg Oral QID  . pantoprazole (PROTONIX) IV  40 mg Intravenous BID  . piperacillin-tazobactam  3.375 g Intravenous Q8H  . potassium chloride  10 mEq Intravenous Q1 Hr x 5  . sodium chloride  10 mL Intravenous Q12H  . sodium chloride  3 mL Intravenous Q12H  . vancomycin  1,000 mg Intravenous Q8H  . zolpidem  10 mg Oral QHS   Assessment: 35 yoM with complicated GI history well known to pharmacy from TPN monitoring.  He has been on abx therapy for asp PNA which was switched to oral Augmentin on 1/5.  Patient is clinically improved from this standpoint. He began complaining of increased pain, burning at G-tube site on 1/8.  Purulent drainage was noted and patient had low grade fever (101.3 F rectally).  WBC remains normal.  Cx data pending.  Antibiotic coverage was changed to Vancomycin & Unasyn, however patient continues to spike fevers. Unasyn is being changed to Zosyn at this time.  Renal function has been stable at patient's baseline since admission.  Vancomycin trough is at goal.  Vancomycin 1/8>> Augmentin 1/5>>1/8 Unasyn 1/8>>1/10 Zosyn 1/2>>1/5; 1/10>>  Goal of Therapy:  Vancomycin trough level 15-20 mcg/ml  Eradicate infection.  Plan:  Continue Vancomycin 1gm IV  q8h Check weekly Vancomycin trough Change Unasyn to Zosyn 3.375gm IV Q8h to be infused over 4hrs Monitor renal function and cx data  Duration of therapy per MD *May consider alternate drug class such as Doripenem if patient continues to spike temp, wound infection not improving on Vanc/Zosyn.  Elson Clan 12/23/2014,1:06 PM

## 2014-12-23 NOTE — Progress Notes (Signed)
Patient ID: David [name], male   DOB: [birth year], 39 y.o.   MRN: [id number]   Assessment/Plan: ADMITTED WITH SBO AND NOW WITH PURULENT D/C FROM PEG SITE WITH MILD ERYTHEMA AT SITE. CURRENTLY ON UNASYN AND VANC. PT APPEARS CLINICALLY STABLE WITH MILD COUGH AND CONTINUE TO SPIKE TEMPS WHILE BEING IMMUNOCOMPROMISED.  PLAN: 1. D/C UNASYN. CHANGE TO ZOSYN. CONTINUE VANCOMYCIN. 2. CT ABD/PELVIS TODAY TO EVALUATE FOR ACTIVE IBD/OCCULT BOWEL PERFORATION WITH ABSCESS/NECROTIZING FASCIITIS. 3. CONTINUE TO MONITOR SYMPTOMS. PT MAY NEED TRANSFER TO [facility] FOR MULTI-SPECIALTY MANAGEMENT.   Subjective: Since I last evaluated the patient HE SPIKED A TEMP TO 103.76F.  THINKS DRAINAGE IS BETTER TODAY.  PT DENIES HA, CHANGE IN VISION, Posterior Nasal DRIP, RUNNY NOSE, EAR PAIN, RINGING IN EARS, OR SORE THROAT. NO CHEST PAIN, SOB  OR CHANGE IN BOWEL HABITS. HAS MILD COUGH. ABD PAIN IN UN-CHANGED(7/10 W/O PAIN MEDS--> 5/10 WITH PAIN MEDS). MILD ERYTHEMA ON LOWER EXTREMITIES. NO BRBPR OR MELENA OR DIARRHEA. NO SORES IN MOUTH,  JOINT PAIN, OR BACK PAIN.  Objective: Vital signs in last 24 hours: Filed Vitals:   12/23/14 0543  BP: 92/53  Pulse: 65  Temp: 97.7 F (36.5 C)  Resp: 20     General appearance: alert, cooperative and no distress HENT: SCLERA ANICTERIC NECK: NO LYMPHADENOPATHY Resp: clear to auscultation bilaterally Cardio: regular rate and rhythm GI: soft, tender TO PALPATION x4, DISTENDED; HYPERACTIVE bowel sounds Extremities: MILD ERYTHEMA BIL LE,3+-4+ PITTING EDEMA  edema  Lab Results: Na 130 K 3.0 Cr 0.5 BLD Cx/U Cx NEG    Studies/Results: No results found.  Medications: I have reviewed the patient's current medications.   LOS: 5 days   Jonette Eva 05/24/2014, 2:23 PM

## 2014-12-23 NOTE — Progress Notes (Signed)
On assessment noticed that patient's G-Tube dressing was saturated with green purulent drainage. Dr. aware and has assessed patient. Will continue to monitor patient's G-Tube site.

## 2014-12-24 LAB — CBC
HEMATOCRIT: 27.2 % — AB (ref 39.0–52.0)
Hemoglobin: 8.5 g/dL — ABNORMAL LOW (ref 13.0–17.0)
MCH: 26.6 pg (ref 26.0–34.0)
MCHC: 31.3 g/dL (ref 30.0–36.0)
MCV: 85.3 fL (ref 78.0–100.0)
Platelets: 69 10*3/uL — ABNORMAL LOW (ref 150–400)
RBC: 3.19 MIL/uL — ABNORMAL LOW (ref 4.22–5.81)
RDW: 20.1 % — ABNORMAL HIGH (ref 11.5–15.5)
WBC: 5.1 10*3/uL (ref 4.0–10.5)

## 2014-12-24 LAB — GLUCOSE, CAPILLARY
Glucose-Capillary: 124 mg/dL — ABNORMAL HIGH (ref 70–99)
Glucose-Capillary: 132 mg/dL — ABNORMAL HIGH (ref 70–99)
Glucose-Capillary: 159 mg/dL — ABNORMAL HIGH (ref 70–99)
Glucose-Capillary: 162 mg/dL — ABNORMAL HIGH (ref 70–99)

## 2014-12-24 LAB — COMPREHENSIVE METABOLIC PANEL
ALT: 32 U/L (ref 0–53)
AST: 22 U/L (ref 0–37)
Albumin: 2.2 g/dL — ABNORMAL LOW (ref 3.5–5.2)
Alkaline Phosphatase: 139 U/L — ABNORMAL HIGH (ref 39–117)
Anion gap: 6 (ref 5–15)
BUN: 14 mg/dL (ref 6–23)
CO2: 31 mmol/L (ref 19–32)
CREATININE: 0.49 mg/dL — AB (ref 0.50–1.35)
Calcium: 7.3 mg/dL — ABNORMAL LOW (ref 8.4–10.5)
Chloride: 95 mEq/L — ABNORMAL LOW (ref 96–112)
GFR calc non Af Amer: >90 mL/min (ref >90)
GLUCOSE: 165 mg/dL — AB (ref 70–99)
Potassium: 3 mmol/L — ABNORMAL LOW (ref 3.5–5.1)
Sodium: 132 mmol/L — ABNORMAL LOW (ref 135–145)
TOTAL PROTEIN: 4.5 g/dL — AB (ref 6.0–8.3)
Total Bilirubin: 1 mg/dL (ref 0.3–1.2)

## 2014-12-24 LAB — PREALBUMIN: Prealbumin: 9.5 mg/dL — ABNORMAL LOW (ref 17.0–34.0)

## 2014-12-24 LAB — WOUND CULTURE: Special Requests: NORMAL

## 2014-12-24 LAB — DIFFERENTIAL
BASOS PCT: 0 % (ref 0–1)
Basophils Absolute: 0 10*3/uL (ref 0.0–0.1)
EOS ABS: 0.1 10*3/uL (ref 0.0–0.7)
Eosinophils Relative: 1 % (ref 0–5)
LYMPHS ABS: 0.6 10*3/uL — AB (ref 0.7–4.0)
Lymphocytes Relative: 11 % — ABNORMAL LOW (ref 12–46)
Monocytes Absolute: 0.4 10*3/uL (ref 0.1–1.0)
Monocytes Relative: 8 % (ref 3–12)
NEUTROS ABS: 4.1 10*3/uL (ref 1.7–7.7)
NEUTROS PCT: 80 % — AB (ref 43–77)

## 2014-12-24 LAB — PHOSPHORUS: PHOSPHORUS: 2.6 mg/dL (ref 2.3–4.6)

## 2014-12-24 LAB — TRIGLYCERIDES: Triglycerides: 78 mg/dL (ref <150)

## 2014-12-24 LAB — MAGNESIUM: MAGNESIUM: 1.5 mg/dL (ref 1.5–2.5)

## 2014-12-24 MED ORDER — FAT EMULSION 20 % IV EMUL
240.0000 mL | INTRAVENOUS | Status: DC
  Filled 2014-12-24: qty 250

## 2014-12-24 MED ORDER — M.V.I. ADULT IV INJ
INJECTION | INTRAVENOUS | Status: DC
Start: 2014-12-24 — End: 2014-12-24
  Filled 2014-12-24: qty 1920

## 2014-12-24 MED ORDER — TRACE MINERALS CR-CU-F-FE-I-MN-MO-SE-ZN IV SOLN
INTRAVENOUS | Status: AC
  Administered 2014-12-24: 18:00:00 via INTRAVENOUS
  Filled 2014-12-24: qty 1440

## 2014-12-24 MED ORDER — POTASSIUM CHLORIDE 10 MEQ/100ML IV SOLN
10.0000 meq | INTRAVENOUS | Status: DC
  Administered 2014-12-24: 10 meq via INTRAVENOUS
  Filled 2014-12-24: qty 100

## 2014-12-24 MED ORDER — POTASSIUM CHLORIDE 10 MEQ/100ML IV SOLN
10.0000 meq | INTRAVENOUS | Status: AC
  Administered 2014-12-24 (×6): 10 meq via INTRAVENOUS
  Filled 2014-12-24 (×5): qty 100

## 2014-12-24 MED ORDER — FAT EMULSION 20 % IV EMUL
240.0000 mL | INTRAVENOUS | Status: AC
  Administered 2014-12-24: 240 mL via INTRAVENOUS
  Filled 2014-12-24: qty 250

## 2014-12-24 MED ORDER — NYSTATIN-TRIAMCINOLONE 100000-0.1 UNIT/GM-% EX CREA
TOPICAL_CREAM | Freq: Two times a day (BID) | CUTANEOUS | Status: DC
  Administered 2014-12-24 – 2014-12-29 (×12): via TOPICAL
  Administered 2014-12-30: 1 via TOPICAL
  Administered 2014-12-30 – 2014-12-31 (×2): via TOPICAL
  Administered 2015-01-01: 1 via TOPICAL
  Administered 2015-01-01: 09:00:00 via TOPICAL
  Filled 2014-12-24: qty 15

## 2014-12-24 NOTE — Progress Notes (Signed)
  Subjective:  Patient continues to complain of upper abdominal pain. He states pain medication is not working anymore. He states he is drinking small amounts of water and carbonated drinks. His bowels are moving. He had 2 small stools in the last 24 hours. He denies shortness of breath. He staes scrotal edema is back.   Objective: Blood pressure 105/63, pulse 77, temperature 97.9 F (36.6 C), temperature source Oral, resp. rate 20, height 5\' 11"  (1.803 m), weight 193 lb 8 oz (87.771 kg), SpO2 100 %. Patient is alert and in no acute distress. He appears chronically ill. Abdomen is full. Mucopurulent material noted on the dressing covering gastrostomy tube site. There is peristomal erythema. Abdomen is soft and left half blood from the right half. He has pitting edema involving upper and lower back as well as across her abdominal wall. He has 3+ pitting edema involving both lower extremities.  Labs/studies Results:   Recent Labs  12/22/14 0601 12/24/14 0649  WBC 5.0 5.1  HGB 8.5* 8.5*  HCT 27.1* 27.2*  PLT 70* 69*    BMET   Recent Labs  12/22/14 0601 12/23/14 0612 12/24/14 0649  NA 131* 130* 132*  K 2.9* 3.0* 3.0*  CL 95* 93* 95*  CO2 32 33* 31  GLUCOSE 106* 132* 165*  BUN 15 15 14   CREATININE 0.53 0.50 0.49*  CALCIUM 7.2* 7.0* 7.3*    LFT   Recent Labs  12/24/14 0649  PROT 4.5*  ALBUMIN 2.2*  AST 22  ALT 32  ALKPHOS 139*  BILITOT 1.0    Stool C. difficile by PCR is negative. Blood cultures remain negative. Urine culture reveals 2001col/mL Wound culture reveals few organisms but no evidence of staph or strep.  Assessment: #1. Fever. He had temp spike on the evening of 12/22/2014. Source of fever felt to be infection and gastrostomy site. He still has significant drainage and therefore will get another specimen for culture. He is on IV vancomycin and Zosyn. He has developed peristomal dermatitis. #3. Fluid overload. He has anasarca. Serum albumin remains  low. He is having good urine output with IV furosemide. It is difficult to explain worsening fluid overload and weight gain. #4. 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 MELD score 3 days ago was 9. #5. Right pulmonary infiltrates. This may be residual from recent aspiration pneumonia. He is not having any respiratory symptoms at the present time. #6. Chronic abdominal pain. Pain control not to patient's satisfaction. I believe pain severity should decrease if we can mobilize fluid from his body/abdomen. #7. Anemia secondary to iron deficiency and chronic disease. H&H is low but stable. #8. Hypokalemia. He is getting runs of IV KCl. #9. Osteoporosis.  Recommendations;  Will decrease TPN rate is 60 ML per hour. Mycolog-II cream to be applied to peristomal skin twice a day. Will send fluid from gastrostomy tube site for another culture.

## 2014-12-24 NOTE — Progress Notes (Signed)
PROGRESS NOTE  David [name] XAJ:[id number] DOB: Aug 12, 1976 DOA: 12/15/2014 PCP: [name], MD   Summary: 39 year old man with severe Crohn's disease, also cirrhosis with chronic thrombocytopenia and esophageal varices rehospitalize for persistent abdominal pain. He was discharged 1/1 and return to the emergency department 1/2 for persistent pain, generalized weakness, developing lower extremity edema and aspiration/shortness of breath after eating a grilled cheese sandwich. Abdominal x-ray was unremarkable and the patient refused CT scan. He was admitted for aspiration pneumonia, early sepsis, acute on chronic abdominal pain with concern for ileus, hypotension. He rapidly improved and was narrowed to oral antibiotics for aspiration pneumonia. Main issue was pain control. Continued to have bowel movements and passes flatus, no evidence of obstruction. Developed fever 1/8 and purulent drainage around G-tube site suspicious for superficial infection. GI has been in conversation with physicians at [facility] who thus far have felt the patient should be managed at [facility].  Assessment/Plan: 1. Fever with cellulitis/infection around gastrostomy tube. Now afebrile 24 hours. Blood cultures are pending, no growth to date. He appears clinically stable. 2. Aspiration pneumonia with suspected sepsis on admission. Resolved clinically, though not radiographically, he does have a history of chronic aspiration findings on chest x-ray. No hypoxia or respiratory symptoms. Maintain NPO except for small sips with medications. 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 3. Crohn's disease with terminal ileal stricture, status post multiple dilatations. Pain controlled. Bowels continue to move. 4. Nutrition. On TPN. Gastrostomy tube in place for decompression. 5. 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 6. Gastroparesis. Stable. Continue PPI, Carafate, low-dose  Reglan 7. Anemia of chronic disease with iron deficiency, thrombocytopenia. Hemoglobin and platelet count remained stable. 8. Chronic steroid therapy for Crohn's disease. 9. Anasarca.  He has excellent diuresis over 3 L per day, now down many liters since admission and yet no change in anasarca. Kidney function is preserved. Plan to concentrate TPN and reduce fluid intake. He reports very little oral intake. 10. Acute on chronic abdominal pain, frozen abdomen. Pain control less effective today with Dilaudid, question absorption of oral narcotic.   His vitals are stable and he is now afebrile for 24 hours but he continues to have significant pain requiring IV Dilaudid. Pneumatic that he can orally absorb medications is unclear at this point and therefore he continues on IV. His CT showed interval development of fluid in the abdomen likely related to anasarca which is failing to improve. He has no clinical features to size suggest pancreatitis and his pancreas appeared normal on the scan.  Check lipase  Code Status: full DVT prophylaxis: SCDs Family Communication: none present; offered to contact family but he prefers to relay information himself. Disposition Plan: home  Brendia Sacks, MD  [facility]  Pager [phone] If 7PM-7AM, please contact night-coverage at [email or web address], password [facility] 12/24/2014, 12:38 PM  LOS: 9 days   Consultants:  Gastroenterology   physical therapy no needs  Occupational therapy no needs    Procedures:    Antibiotics:  Unasyn 1/8 >>  Vancomycin 1/8 >>  Zosyn 1/2 >> 1/5  Augmentin 1/5 >> 1/8  HPI/Subjective: Pain seems to be a little bit worse in his abdomen today. He reports sipping on some liquids but very little oral intake. Drainage around G-tube has decreased and he feels like this is getting better. He does have some soreness around this area but not a lot. Predominantly his pain remains crescent shaped on the right side of the  abdomen.  Really no change in his lower extremity edema, in fact he has had more groin swelling. No shortness of breath.  Objective: Filed Vitals:   12/23/14 0543 12/23/14 1447 12/23/14 2110 12/24/14 0559  BP: 92/53 98/63 103/68 105/63  Pulse: 65 69 81 77  Temp: 97.7 F (36.5 C) 97.9 F (36.6 C) 98.4 F (36.9 C) 97.9 F (36.6 C)  TempSrc: Oral Oral Oral Oral  Resp: Height:      Weight: 84.142 kg (185 lb 8 oz)   87.771 kg (193 lb 8 oz)  SpO2: 100% 99% 99% 100%    Intake/Output Summary (Last 24 hours) at 12/24/14 1238 Last data filed at 12/24/14 1147  Gross per 24 hour  Intake   2270 ml  Output   2852 ml  Net   -582 ml     Filed Weights   12/22/14 0603 12/23/14 0543 12/24/14 0559  Weight: 81.784 kg (180 lb 4.8 oz) 84.142 kg (185 lb 8 oz) 87.771 kg (193 lb 8 oz)    Exam:    Afebrile 24 hours, VSS stable. No hypoxia. General: Appears calm and comfortable Cardiovascular: RRR, no m/r/g. No change in 3+ bilateral lower extremity edema up to the thigh. Respiratory: CTA bilaterally, no w/r/r. Normal respiratory effort. Abdomen: soft, no change on exam Psychiatric: grossly normal mood and affect, speech fluent and appropriate  Data Reviewed:  Urine output 4627. -9 L since admission.  Capillary blood sugar remain stable  Potassium 3.0.   Hemoglobin stable 8.5, platelet count stable 69  Pertinent data: Labs  Respiratory culture no growth, final. Normal oral pharyngeal flora.  Chest x-ray: mild left base atelectasis, right base opacity somewhat increased, atelectasis versus pneumonia.  CT abdomen and pelvis: Anasarca. Diffuse mesenteric edema. Chronic bilateral lung infiltrates.   1/2 Blood cultures no growth, final  Sputum culture 1/5  >> normal oral pharyngeal flora   Urine culture 1/8 >> insignificant growth  Wound culture >>Multiple organisms present, non-predominant  Pending data:  Blood cultures 1/8>>  Scheduled Meds: . acidophilus  1  capsule Oral Daily  . furosemide  40 mg Intravenous BID  . hydrocortisone sod succinate (SOLU-CORTEF) inj  50 mg Intravenous Daily  . insulin aspart  0-20 Units Subcutaneous 4 times per day  . metoCLOPramide (REGLAN) injection  5 mg Intravenous 3 times per day  . oxyCODONE  20 mg Oral QID  . pantoprazole (PROTONIX) IV  40 mg Intravenous BID  . piperacillin-tazobactam  3.375 g Intravenous Q8H  . potassium chloride  10 mEq Intravenous Q1 Hr x 6  . sodium chloride  10 mL Intravenous Q12H  . sodium chloride  3 mL Intravenous Q12H  . vancomycin  1,000 mg Intravenous Q8H  . zolpidem  10 mg Oral QHS   Continuous Infusions: . Marland KitchenTPN (CLINIMIX-E) Adult 80 mL/hr at 12/23/14 1802   And  . fat emulsion 240 mL (12/23/14 1757)  . Marland KitchenTPN (CLINIMIX-E) Adult     And  . fat emulsion      Principal Problem:   Aspiration pneumonia Active Problems:   Crohn's disease of both small and large intestine with fistula   Chronic right lower quadrant pain   Hypertension   Liver cirrhosis   Gastroparesis   Aspiration into airway   Anasarca   Anemia of chronic disease   Abdominal pain, generalized   Infection   Time spent 20 minutes

## 2014-12-24 NOTE — Progress Notes (Addendum)
PARENTERAL NUTRITION CONSULT NOTE  Pharmacy Consult for TPN Indication: prolonged ileus  Allergies  Allergen Reactions  . Remicade [Infliximab]     Dr Karilyn Cota states previous respiratory arrest not related to remicade. Dr Karilyn Cota states remicade not a drug related allergy. 07-07-2013 at 1025 rapid response called to PACU- Patient difficulty breathing after infusion of Remicade infusing. Do NOT Give Remicade!  . Fentanyl And Related Itching    Swelling, Redness  . Alfentanil Itching    Swelling, Redness  . Wellbutrin [Bupropion] Nausea And Vomiting  . Morphine And Related Hives   Patient Measurements: Height: 5\' 11"  (180.3 cm) Weight: 193 lb 8 oz (87.771 kg) IBW/kg (Calculated) : 75.3  Usual body weight ~ 100kg  Vital Signs: Temp: 97.9 F (36.6 C) (01/11 0559) Temp Source: Oral (01/11 0559) BP: 105/63 mmHg (01/11 0559) Pulse Rate: 77 (01/11 0559) Intake/Output from previous day: 01/10 0701 - 01/11 0700 In: 2150 [P.O.:220; IV Piggyback:850; TPN:1080] Out: 4627 [Urine:2725; Drains:1900; Stool:2] Intake/Output from this shift:    Labs:  Recent Labs  12/22/14 0601 12/24/14 0649  WBC 5.0 5.1  HGB 8.5* 8.5*  HCT 27.1* 27.2*  PLT 70* 69*     Recent Labs  12/22/14 0601 12/23/14 0612 12/24/14 0649  NA 131* 130* 132*  K 2.9* 3.0* 3.0*  CL 95* 93* 95*  CO2 32 33* 31  GLUCOSE 106* 132* 165*  BUN 15 15 14   CREATININE 0.53 0.50 0.49*  CALCIUM 7.2* 7.0* 7.3*  MG  --  1.6 1.5  PHOS  --   --  2.6  PROT  --   --  4.5*  ALBUMIN  --   --  2.2*  AST  --   --  22  ALT  --   --  32  ALKPHOS  --   --  139*  BILITOT  --   --  1.0  TRIG  --   --  78   Estimated Creatinine Clearance: 133.3 mL/min (by C-G formula based on Cr of 0.49).    Recent Labs  12/23/14 1740 12/24/14 0019 12/24/14 0601  GLUCAP 137* 132* 162*   Medical History: Past Medical History  Diagnosis Date  . 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 Crohn's disease with fistula 05/04/2011    both large and small  intestinges/notes 11/15/2012  . Hepatomegaly 05/04/2011  . Fatty liver 05/04/2011    "stage III fatty liver fibrosis" (11/15/2012)  . GERD (gastroesophageal reflux disease)   . Chronic liver disease     /notes 11/15/2012  . Pericarditis     [name] 11/15/2012  . Hypertension   . Pneumonia 1977  . Shortness of breath     "all the time right now" (11/15/2012)  . History of blood transfusion 2004  . History of stomach ulcers   . Duodenal ulcer   . Depression   . Kidney stones     bilaterally/notes 11/15/2012  . Hepatic fibrosis     [name] 11/15/2012  . Anemia   . Pericardial effusion 10/29/2012    moderate to large/notes 11/15/2012  . Anxiety   . Hepatitis   . Crohn's disease   . ED (erectile dysfunction)   . 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 States that he just been seen at [facility] 12/05/14 to change out his PEG tube to a larger size but was not done due to low BP.  Was sent home to be with family over holidays. Over that period of time he progressively got weaker and weaker and felt more ill or nauseous and was unable to keep anything down.  He was hospitalized at [facility] 12/27-31 and discharged with TPN but was readmitted due to choking and possible aspiration after eating at home.   Evaluation at [facility] for liver/colon transplant is pending.   Na chronically low but stable.  K+ remains low today despite K+ runs yesterday.  Corrected calcium at goal. CBGs mildly increased with addition of Solu-Cortef and correction insulin given.   24hr I/O  -  Estimated Nutritional Needs: Kcal: 2100-2300 Protein: 96-106 grams Fluid: 2.1-2.3 L  Plan:   Continue TPN with Clinimix E 5/15 at 60ml/hr   Provide lipids, trace elements, and MVI with TPN  Continue sliding scale insulin with CBG checks q6hrs  IV KCl runs x 6 today  Monitor lytes, triglycerides, glucose tolerance, renal fxn, and fluid status  Elson Clan 12/24/2014,10:16 AM  Addendum: Discussed patient with Dr Karilyn Cota.  Concern of 8# wt gain, edema over past couple days despite good UOP and drainage.  Per request will decrease TPN to 7ml/hr for next couple days.  Will F/U fluid status with Dr Karilyn Cota prior to any rate increases.  Junita Push, PharmD, BCPS 12/24/2014@1 :33 PM

## 2014-12-25 LAB — GLUCOSE, CAPILLARY
GLUCOSE-CAPILLARY: 200 mg/dL — AB (ref 70–99)
Glucose-Capillary: 115 mg/dL — ABNORMAL HIGH (ref 70–99)
Glucose-Capillary: 120 mg/dL — ABNORMAL HIGH (ref 70–99)
Glucose-Capillary: 185 mg/dL — ABNORMAL HIGH (ref 70–99)
Glucose-Capillary: 83 mg/dL (ref 70–99)

## 2014-12-25 LAB — BASIC METABOLIC PANEL
ANION GAP: 5 (ref 5–15)
BUN: 13 mg/dL (ref 6–23)
CALCIUM: 7.3 mg/dL — AB (ref 8.4–10.5)
CHLORIDE: 96 meq/L (ref 96–112)
CO2: 33 mmol/L — AB (ref 19–32)
CREATININE: 0.55 mg/dL (ref 0.50–1.35)
GFR calc Af Amer: >90 mL/min (ref >90)
GFR calc non Af Amer: >90 mL/min (ref >90)
GLUCOSE: 112 mg/dL — AB (ref 70–99)
Potassium: 3.3 mmol/L — ABNORMAL LOW (ref 3.5–5.1)
SODIUM: 134 mmol/L — AB (ref 135–145)

## 2014-12-25 LAB — LIPASE, BLOOD: Lipase: 16 U/L (ref 11–59)

## 2014-12-25 MED ORDER — CLINIMIX E/DEXTROSE (5/15) 5 % IV SOLN
INTRAVENOUS | Status: AC
  Administered 2014-12-25: 18:00:00 via INTRAVENOUS
  Filled 2014-12-25: qty 1440

## 2014-12-25 MED ORDER — FAT EMULSION 20 % IV EMUL
240.0000 mL | INTRAVENOUS | Status: AC
  Administered 2014-12-25: 240 mL via INTRAVENOUS
  Filled 2014-12-25: qty 250

## 2014-12-25 MED ORDER — SPIRONOLACTONE 25 MG PO TABS
25.0000 mg | ORAL_TABLET | Freq: Two times a day (BID) | ORAL | Status: DC
  Administered 2014-12-25 – 2015-01-01 (×14): 25 mg via ORAL
  Filled 2014-12-25 (×14): qty 1

## 2014-12-25 MED ORDER — POTASSIUM CHLORIDE 10 MEQ/100ML IV SOLN
10.0000 meq | INTRAVENOUS | Status: AC
  Administered 2014-12-25 (×3): 10 meq via INTRAVENOUS
  Filled 2014-12-25 (×3): qty 100

## 2014-12-25 NOTE — Progress Notes (Signed)
PROGRESS NOTE  David [name] ZOX:[id number] DOB: 1976/10/17 DOA: 12/15/2014 PCP: [name], MD   Summary: 39 year old man with severe Crohn's disease, also cirrhosis with chronic thrombocytopenia and esophageal varices rehospitalize for persistent abdominal pain. He was discharged 1/1 and return to the emergency department 1/2 for persistent pain, generalized weakness, developing lower extremity edema and aspiration/shortness of breath after eating a grilled cheese sandwich. Abdominal x-ray was unremarkable and the patient refused CT scan. He was admitted for aspiration pneumonia, early sepsis, acute on chronic abdominal pain with concern for ileus, hypotension. He rapidly improved and was narrowed to oral antibiotics for aspiration pneumonia. Main issue was pain control. Continued to have bowel movements and passes flatus, no evidence of obstruction. Developed fever 1/8 and purulent drainage around G-tube site suspicious for superficial infection. GI has been in conversation with physicians at [facility] who thus far have felt the patient should be managed at [facility].  Assessment/Plan: 1. Fever with cellulitis/infection around gastrostomy tube. Now afebrile 24 hours. Blood cultures are pending, no growth to date. He appears clinically stable. Currently on Vanc and Zosyn. F/u on wound cultures.  2. Aspiration pneumonia with suspected sepsis on admission. Resolved clinically, though not radiographically, he does have a history of chronic aspiration findings on chest x-ray. No hypoxia or respiratory symptoms. Maintain NPO except for small sips with medications. 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 3. Crohn's disease with terminal ileal stricture, status post multiple dilatations. Pain controlled. Bowels continue to move. 4. Nutrition. On TPN. Gastrostomy tube in place for decompression. 5. 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 6. Gastroparesis. Stable. Continue PPI, Carafate, low-dose Reglan 7. Anemia of chronic disease with iron deficiency, thrombocytopenia. Hemoglobin and platelet count remained stable. 8. Chronic steroid therapy for Crohn's disease. 9. Anasarca- Ascites with severe pedal edema. He has excellent diuresis over 3 L per day, now down many liters since admission and yet no change in anasarca. Kidney function is preserved. Have added Aldactone today to regimen. Cont to follow I and O carefully.  10. Acute on chronic abdominal pain- due to extensive distention from ascites. Pain control less effective today with Dilaudid, question absorption of oral narcotic.   I feel that abdominal pain is mostly due to distension from ascites. Added Aldactone today to increase diuresis. De-escalate antibiotics as soon as able to prevent C diff infection.   Code Status: full DVT prophylaxis: SCDs Family Communication: none present Disposition Plan: home  Calvert Cantor, MD  [facility]  Pager:  [email or web address], password [facility] 12/25/2014, 1:06 PM  LOS: 10 days   Consultants:  Gastroenterology   physical therapy no needs  Occupational therapy no needs    Procedures:    Antibiotics: Anti-infectives    Start     Dose/Rate Route Frequency Ordered Stop   12/23/14 1800  piperacillin-tazobactam (ZOSYN) IVPB 3.375 g     3.375 g12.5 mL/hr over 240 Minutes Intravenous Every 8 hours 12/23/14 1301     12/21/14 2000  vancomycin (VANCOCIN) IVPB 1000 mg/200 mL premix     1,000 mg200 mL/hr over 60 Minutes Intravenous Every 8 hours 12/21/14 0751     12/21/14 1600  Ampicillin-Sulbactam (UNASYN) 3 g in sodium chloride 0.9 % 100 mL IVPB  Status:  Discontinued     3 g100 mL/hr over 60 Minutes Intravenous Every 6 hours 12/21/14 1521 12/23/14 1300   12/21/14 0800  vancomycin (VANCOCIN) 1,500 mg in sodium chloride 0.9 % 500 mL IVPB  1,500 mg250 mL/hr over 120 Minutes Intravenous NOW 12/21/14 0751 12/21/14 1120    12/19/14 1000  amoxicillin-clavulanate (AUGMENTIN) 250-62.5 MG/5ML suspension 800 mg  Status:  Discontinued     800 mg Oral Every 12 hours 12/19/14 0751 12/19/14 0827   12/19/14 1000  amoxicillin-clavulanate (AUGMENTIN) 200-28.5 MG/5ML suspension 800 mg  Status:  Discontinued     800 mg Oral Every 12 hours 12/19/14 0829 12/21/14 1443   12/18/14 2200  amoxicillin-clavulanate (AUGMENTIN) 400-57 MG/5ML suspension 800 mg  Status:  Discontinued     800 mg Oral Every 12 hours 12/18/14 1730 12/19/14 0747   12/16/14 0600  vancomycin (VANCOCIN) IVPB 750 mg/150 ml premix  Status:  Discontinued     750 mg150 mL/hr over 60 Minutes Intravenous Every 12 hours 12/15/14 1815 12/16/14 0915   12/16/14 0200  piperacillin-tazobactam (ZOSYN) IVPB 3.375 g  Status:  Discontinued     3.375 g12.5 mL/hr over 240 Minutes Intravenous Every 8 hours 12/15/14 1815 12/18/14 1730   12/15/14 1715  piperacillin-tazobactam (ZOSYN) IVPB 3.375 g     3.375 g100 mL/hr over 30 Minutes Intravenous  Once 12/15/14 1714 12/15/14 2023   12/15/14 1630  vancomycin (VANCOCIN) 1,500 mg in sodium chloride 0.9 % 500 mL IVPB  Status:  Discontinued     1,500 mg250 mL/hr over 120 Minutes Intravenous  Once 12/15/14 1559 12/16/14 1959   12/15/14 1600  ceFEPIme (MAXIPIME) 2 g in dextrose 5 % 50 mL IVPB  Status:  Discontinued     2 g100 mL/hr over 30 Minutes Intravenous  Once 12/15/14 1559 12/15/14 1703       HPI/Subjective: Pain seems to be a little bit worse in his abdomen today. He reports sipping on some liquids but very little oral intake. Drainage around G-tube has decreased and he feels like this is getting better. He does have some soreness around this area but not a lot. Predominantly his pain remains crescent shaped on the right side of the abdomen. Really no change in his lower extremity edema, in fact he has had more groin swelling. No shortness of breath.  Objective: Filed Vitals:   12/24/14 1442 12/24/14 2105 12/25/14 0610 12/25/14  0830  BP: 103/70 108/70 98/60   Pulse: 86 79 72   Temp: 98.1 F (36.7 C) 98.3 F (36.8 C) 98.1 F (36.7 C)   TempSrc:  Oral Oral   Resp: 20 20 20    Height:      Weight:   92 kg (202 lb 13.2 oz) 85.367 kg (188 lb 3.2 oz)  SpO2: 99% 100% 99%     Intake/Output Summary (Last 24 hours) at 12/25/14 1306 Last data filed at 12/25/14 0600  Gross per 24 hour  Intake   2260 ml  Output   1700 ml  Net    560 ml     Filed Weights   12/24/14 0559 12/25/14 0610 12/25/14 0830  Weight: 87.771 kg (193 lb 8 oz) 92 kg (202 lb 13.2 oz) 85.367 kg (188 lb 3.2 oz)    Exam:    Afebrile 24 hours, VSS stable. No hypoxia. General: Appears calm and comfortable Cardiovascular: RRR, no m/r/g. No change in 3+ bilateral lower extremity edema up to the thigh. Respiratory: CTA bilaterally, no w/r/r. Normal respiratory effort. Abdomen: soft, no change on exam Psychiatric: grossly normal mood and affect, speech fluent and appropriate    Pertinent data: Labs  Respiratory culture no growth, final. Normal oral pharyngeal flora.  Chest x-ray: mild left base atelectasis, right base  opacity somewhat increased, atelectasis versus pneumonia.  CT abdomen and pelvis: Anasarca. Diffuse mesenteric edema. Chronic bilateral lung infiltrates.   1/2 Blood cultures no growth, final  Sputum culture 1/5  >> normal oral pharyngeal flora   Urine culture 1/8 >> insignificant growth  Wound culture >>Multiple organisms present, non-predominant  Pending data:  Blood cultures 1/8>> no growth to date  Scheduled Meds: . acidophilus  1 capsule Oral Daily  . furosemide  40 mg Intravenous BID  . hydrocortisone sod succinate (SOLU-CORTEF) inj  50 mg Intravenous Daily  . insulin aspart  0-20 Units Subcutaneous 4 times per day  . metoCLOPramide (REGLAN) injection  5 mg Intravenous 3 times per day  . nystatin-triamcinolone   Topical BID  . oxyCODONE  20 mg Oral QID  . pantoprazole (PROTONIX) IV  40 mg Intravenous BID  .  piperacillin-tazobactam  3.375 g Intravenous Q8H  . sodium chloride  10 mL Intravenous Q12H  . sodium chloride  3 mL Intravenous Q12H  . vancomycin  1,000 mg Intravenous Q8H  . zolpidem  10 mg Oral QHS   Continuous Infusions: . Marland KitchenTPN (CLINIMIX-E) Adult 60 mL/hr at 12/24/14 1805   And  . fat emulsion 240 mL (12/24/14 1804)      Time spent 30 minutes

## 2014-12-25 NOTE — Progress Notes (Signed)
NUTRITION FOLLOW UP  Intervention:   -TPN per pharmacy  Nutrition Dx:   Inadequate oral intake related to altered GI function as evidenced by NPO; ongoing   Goal:   Pt will meet >90% of estimated nutritional needs; progressing via TPN  Monitor:  TPN tolerance, transition to PO diet/enteral feedings (if feasible), labs, weight changes, I/O's  Assessment:   David [name] is a 39 y.o. male with a history of Crohn disease with fistula, Stage III fatty liver fibrosis and chronic liver disease, anemia, hypertension, failure to thrive, on chronic steroids. Patient underwent exploratory laparotomy at [facility] on 11/05/2014 with plans of lysis adhesions and possible bowel resection, but surgery was never able to completely enter the abdomen due to frozen abdomen. Patient has a gastrostomy tube placed percutaneously. Over the past several weeks, the patient's abdominal pain has been increasing.   Pt remains NPO with sips of liquids due to multiple GI issues. He had an appointment with [facility] on 12/21/13 to evaluate for colon and liver transplant. Per MD notes, pt missed appointment at [facility] due to hospitalization. GI has been in contact with [facility] for consultation; feel that pt can currently be managed at [facility].  Pt developed fever on 12/21/14 and is on IV antibiotics due to possible infection to G-tube site- G-tube fluid has been sent for cultures.  He remains on TPN- per pharmacy, rate decreased per GI request due to concern of fluid overload and edema. Clinimix E 5/15 now running at 60 ml/hr with daily lipids, trace elements and MVI which provides 1502 kcals (72% of minimum estimated kcal needs) and  72 grams protein(75% of minimum estimated protein needs).  He continues with good output for G-tube (2100 ml in the past 24 hours per GI notes). Wt has been stable since admission. He remains fluid overloaded; he is continues to receive IV lasix (2400 ml urine output in the past 24 hours). Labs reviewed.  Na: 137, K: 3.3, CO2: 33,  Calcium: 7.3, Glucose: 112. CBGS: 83-200. Mg and Phos WDL. Pt receiving sliding scale insulin with CBG checks every 6 hours.   Height: Ht Readings from Last 1 Encounters:  12/15/14  (1.803 m)    Weight Status:   Wt Readings from Last 1 Encounters:  12/25/14 188 lb 3.2 oz (85.367 kg)   12/20/14 186 lb 8 oz (84.596 kg)   12/16/14 189 lb 2.5 oz (85.8 kg)   Re-estimated needs:  Kcal: 2100-2300 Protein: 96-106 grams Fluid: 2.1-2.3 L  Skin: mid abdominal incision, 3+ pitting edema involving both lower extremities, scrotal edema  Diet Order: Diet NPO time specified Except for: Sips with Meds TPN (CLINIMIX-E) Adult   Intake/Output Summary (Last 24 hours) at 12/25/14 1121 Last data filed at 12/25/14 0600  Gross per 24 hour  Intake   2380 ml  Output   2700 ml  Net   -320 ml    Last BM: 12/25/14   Labs:   Recent Labs Lab 12/20/14 0551  12/23/14 0612 12/24/14 0649 12/25/14 0607  NA 132*  < > 130* 132* 134*  K 3.9  < > 3.0* 3.0* 3.3*  CL 99  < > 93* 95* 96  CO2 29  < > 33* 31 33*  BUN 17  < > CREATININE 0.54  < > 0.50 0.49* 0.55  CALCIUM 7.5*  < > 7.0* 7.3* 7.3*  MG 1.4*  --  1.6 1.5  --   PHOS 2.6  --   --  2.6  --   GLUCOSE 114*  < > 132* 165* 112*  < > = values in this interval not displayed.  CBG (last 3)   Recent Labs  12/25/14 0019 12/25/14 0608 12/25/14 1117  GLUCAP 200* 115* 83    Scheduled Meds: . acidophilus  1 capsule Oral Daily  . furosemide  40 mg Intravenous BID  . hydrocortisone sod succinate (SOLU-CORTEF) inj  50 mg Intravenous Daily  . insulin aspart  0-20 Units Subcutaneous 4 times per day  . metoCLOPramide (REGLAN) injection  5 mg Intravenous 3 times per day  . nystatin-triamcinolone   Topical BID  . oxyCODONE  20 mg Oral QID  . pantoprazole (PROTONIX) IV  40 mg Intravenous BID  . piperacillin-tazobactam  3.375 g Intravenous Q8H  . sodium chloride  10 mL Intravenous Q12H  . sodium  chloride  3 mL Intravenous Q12H  . vancomycin  1,000 mg Intravenous Q8H  . zolpidem  10 mg Oral QHS    Continuous Infusions: . Marland KitchenTPN (CLINIMIX-E) Adult 60 mL/hr at 12/24/14 1805   And  . fat emulsion 240 mL (12/24/14 1804)    Nyaisha Simao A. Mayford Knife, RD, LDN, CDE Pager: [phone] After hours Pager: [phone]

## 2014-12-25 NOTE — Progress Notes (Signed)
PARENTERAL NUTRITION CONSULT NOTE  Pharmacy Consult for TPN Indication: prolonged ileus  Allergies  Allergen Reactions  . Remicade [Infliximab]     Dr Karilyn Cota states previous respiratory arrest not related to remicade. Dr Karilyn Cota states remicade not a drug related allergy. 07-07-2013 at 1025 rapid response called to PACU- Patient difficulty breathing after infusion of Remicade infusing. Do NOT Give Remicade!  . Fentanyl And Related Itching    Swelling, Redness  . Alfentanil Itching    Swelling, Redness  . Wellbutrin [Bupropion] Nausea And Vomiting  . Morphine And Related Hives   Patient Measurements: Height: 5\' 11"  (180.3 cm) Weight: 188 lb 3.2 oz (85.367 kg) IBW/kg (Calculated) : 75.3  Usual body weight ~ 100kg  Vital Signs: Temp: 98.1 F (36.7 C) (01/12 0610) Temp Source: Oral (01/12 0610) BP: 98/60 mmHg (01/12 0610) Pulse Rate: 72 (01/12 0610) Intake/Output from previous day: 01/11 0701 - 01/12 0700 In: 2500 [P.O.:360; IV Piggyback:1300; TPN:840] Out: 4500 [Urine:2400; Drains:2100] Intake/Output from this shift:    Labs:  Recent Labs  12/24/14 0649  WBC 5.1  HGB 8.5*  HCT 27.2*  PLT 69*     Recent Labs  12/23/14 0612 12/24/14 0649 12/25/14 0607  NA 130* 132* 134*  K 3.0* 3.0* 3.3*  CL 93* 95* 96  CO2 33* 31 33*  GLUCOSE 132* 165* 112*  BUN 15 14 13   CREATININE 0.50 0.49* 0.55  CALCIUM 7.0* 7.3* 7.3*  MG 1.6 1.5  --   PHOS  --  2.6  --   PROT  --  4.5*  --   ALBUMIN  --  2.2*  --   AST  --  22  --   ALT  --  32  --   ALKPHOS  --  139*  --   BILITOT  --  1.0  --   PREALBUMIN  --  9.5*  --   TRIG  --  78  --    Estimated Creatinine Clearance: 133.3 mL/min (by C-G formula based on Cr of 0.55).    Recent Labs  12/25/14 0019 12/25/14 0608 12/25/14 1117  GLUCAP 200* 115* 83   Medical History: Past Medical History  Diagnosis Date  . 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 Crohn's disease with fistula 05/04/2011    both large and small  intestinges/notes 11/15/2012  . Hepatomegaly 05/04/2011  . Fatty liver 05/04/2011    "stage III fatty liver fibrosis" (11/15/2012)  . GERD (gastroesophageal reflux disease)   . Chronic liver disease     /notes 11/15/2012  . Pericarditis     [name] 11/15/2012  . Hypertension   . Pneumonia 1977  . Shortness of breath     "all the time right now" (11/15/2012)  . History of blood transfusion 2004  . History of stomach ulcers   . Duodenal ulcer   . Depression   . Kidney stones     bilaterally/notes 11/15/2012  . Hepatic fibrosis     [name] 11/15/2012  . Anemia   . Pericardial effusion 10/29/2012    moderate to large/notes 11/15/2012  . Anxiety   . Hepatitis   . Crohn's disease   . ED (erectile dysfunction)   . 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 hx crohns diag ~2002, symptoms since age 43 s/p Gastrojejunostomy 2004 complicated open abd + Mesh, Cirrhosis 2/2  to MTX use? 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 States that he just been seen at [facility] 12/05/14 to change out his PEG tube to a larger size but was not done due to low BP.  Was sent home to be with family over holidays. Over that period of time he progressively got weaker and weaker and felt more ill or nauseous and was unable to keep anything down.  He was hospitalized at [facility] 12/27-31 and discharged with TPN but was readmitted due to choking and possible aspiration after eating at home.   Evaluation at [facility] for liver/colon transplant is pending.   Na chronically low but stable.  K+ remains low today despite K+ runs yesterday.  Corrected calcium at goal. CBGs mildly increased with addition of Solu-Cortef and correction insulin given.   24hr I/O  -  Dr Karilyn Cota spoke with clinical pharmacist yesterday. Says pt still edematous, 8# wt gain since Fri despite good UOP. Requested decrease TPN rate to max 54ml/hr for next couple of days--->he asked if there was any way to "concentrate" TPN and unfortunately there is not since we now only have access to commercially available Clinimix bags.    Estimated Nutritional Needs: Kcal: 2100-2300 Protein: 96-106 grams Fluid: 2.1-2.3 L  Plan:   Continue TPN with Clinimix E 5/15 at 72ml/hr   Provide lipids, trace elements, and MVI with TPN  Continue sliding scale insulin with CBG checks q6hrs  IV KCl runs x 3 today  Monitor lytes, triglycerides, glucose tolerance, renal fxn, and fluid status  F/U fluid status with Dr Karilyn Cota prior to any rate increases.  Valrie Hart A 12/25/2014,1:09 PM

## 2014-12-25 NOTE — Progress Notes (Signed)
  Subjective:  Patient complains of diarrhea. He's had 5 or 6 loose stools in the last 16 hours. He denies melena or rectal bleeding. He continues to complain of abdominal pain which is predominantly in right half of his abdomen. He states pain is constant and he gets some relief with pain medication. He has minimal cough with scant amount of sputum which is clear.  He has rated his pain between 5 and 9 the last 24 hours.   Objective: Blood pressure 98/60, pulse 72, temperature 98.1 F (36.7 C), temperature source Oral, resp. rate 20, height  (1.803 m), weight 202 lb 13.2 oz (92 kg), SpO2 99 %. Patient is alert but in no acute distress. He is pale. Abdomen is full. Mucopurulent discharge noted around the gastrostomy tube site. There is erythema to peri-stomal skin. Abdomen is from and right upper and lower quadrant soft on the left site. Liver edge is indistinct. Edema noted to lower abdominal wall. Flank and lower extremity edema is unchanged.  Intake last 24 hours; By mouth intake 360 mL. IV fluids including TPN 2140 mL.  Output last 24 hours. Gastrostomy tube output 2100 mL. Urine output 2400 mL  Labs/studies Results:   Recent Labs  12/24/14 0649  WBC 5.1  HGB 8.5*  HCT 27.2*  PLT 69*    BMET   Recent Labs  12/23/14 0612 12/24/14 0649 12/25/14 0607  NA 130* 132* 134*  K 3.0* 3.0* 3.3*  CL 93* 95* 96  CO2 33* 31 33*  GLUCOSE 132* 165* 112*  BUN CREATININE 0.50 0.49* 0.55  CALCIUM 7.0* 7.3* 7.3*    LFT   Recent Labs  12/24/14 0649  PROT 4.5*  ALBUMIN 2.2*  AST 22  ALT 32  ALKPHOS 139*  BILITOT 1.0    Blood cultures remain negative.  Wound cultures from 12/21/2013 via multiple organisms but none predominant.   Assessment:  #1. Fluid overload. His weight has gone up by 9 pounds in the last 24 hours. I believe his weight record is not correct. Going by his intake and output he should've lost some weight. TPN rate was reduced from 80 mL  to 60 mL per hour but he is still getting 1300 mL in piggybacks. #2. Fever secondary to infection at gastrostomy tube site. He has been afebrile for 48 hours. He is on Unasyn and IV vancomycin. #3. Crohn's disease with strictures resulting in nonfunctional GI tract. #4. 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 We will recalculate meld score later this week. #5. Abdominal pain. Pain control still not to patient's satisfaction. #6. Hypokalemia. He received IV KCl yesterday and he is also receiving KCl and parenteral nutrition. Serum potassium is up today. #7. Diarrhea. Patient has been on antibiotics for several days. C. difficile 4 days ago was negative. This will be repeated. #8. Right-sided pulmonary infiltrates. This could be residual from aspiration pneumonia. He will need follow-up CT at some point. Recommendations:  C. difficile by PCR today. Continue IV furosemide. Will discuss with the pharmacist if there is room for decreasing IV fluid that he is receiving with medications in piggybacks. Will arrange for consultation with Dr. Marval Regal of [facility] transplant service on an outpatient basis.

## 2014-12-26 ENCOUNTER — Inpatient Hospital Stay (HOSPITAL_COMMUNITY): Payer: Medicare Other

## 2014-12-26 LAB — GLUCOSE, CAPILLARY
GLUCOSE-CAPILLARY: 124 mg/dL — AB (ref 70–99)
GLUCOSE-CAPILLARY: 95 mg/dL (ref 70–99)
Glucose-Capillary: 103 mg/dL — ABNORMAL HIGH (ref 70–99)

## 2014-12-26 LAB — CULTURE, BLOOD (ROUTINE X 2)
CULTURE: NO GROWTH
Culture: NO GROWTH

## 2014-12-26 LAB — BASIC METABOLIC PANEL
Anion gap: 5 (ref 5–15)
BUN: 11 mg/dL (ref 6–23)
CALCIUM: 7.4 mg/dL — AB (ref 8.4–10.5)
CO2: 33 mmol/L — ABNORMAL HIGH (ref 19–32)
Chloride: 96 mEq/L (ref 96–112)
Creatinine, Ser: 0.54 mg/dL (ref 0.50–1.35)
GLUCOSE: 105 mg/dL — AB (ref 70–99)
POTASSIUM: 2.9 mmol/L — AB (ref 3.5–5.1)
SODIUM: 134 mmol/L — AB (ref 135–145)

## 2014-12-26 LAB — CLOSTRIDIUM DIFFICILE BY PCR: Toxigenic C. Difficile by PCR: NEGATIVE

## 2014-12-26 MED ORDER — FUROSEMIDE 10 MG/ML IJ SOLN
40.0000 mg | Freq: Three times a day (TID) | INTRAMUSCULAR | Status: DC
  Administered 2014-12-26 – 2014-12-30 (×12): 40 mg via INTRAVENOUS
  Filled 2014-12-26 (×13): qty 4

## 2014-12-26 MED ORDER — HYDROMORPHONE HCL 2 MG/ML IJ SOLN: 4.0000 mg | INTRAMUSCULAR | Status: DC | PRN

## 2014-12-26 MED ORDER — POTASSIUM CHLORIDE 20 MEQ/15ML (10%) PO SOLN
40.0000 meq | Freq: Three times a day (TID) | ORAL | Status: DC
  Administered 2014-12-26 – 2015-01-01 (×19): 40 meq via ORAL
  Filled 2014-12-26 (×20): qty 30

## 2014-12-26 MED ORDER — HYDROMORPHONE HCL 1 MG/ML IJ SOLN
1.0000 mg | INTRAMUSCULAR | Status: DC | PRN
  Administered 2014-12-26: 1 mg via INTRAVENOUS
  Filled 2014-12-26: qty 1

## 2014-12-26 MED ORDER — ALBUMIN HUMAN 25 % IV SOLN
INTRAVENOUS | Status: AC
Start: 2014-12-26 — End: 2014-12-26
  Filled 2014-12-26: qty 100

## 2014-12-26 MED ORDER — HYDROMORPHONE HCL 2 MG/ML IJ SOLN
4.0000 mg | INTRAMUSCULAR | Status: DC | PRN
  Administered 2014-12-26 – 2014-12-28 (×21): 4 mg via INTRAVENOUS
  Filled 2014-12-26 (×21): qty 2

## 2014-12-26 MED ORDER — NICOTINE 7 MG/24HR TD PT24
MEDICATED_PATCH | TRANSDERMAL | Status: AC
Start: 2014-12-26 — End: 2014-12-26
  Filled 2014-12-26: qty 1

## 2014-12-26 MED ORDER — TRACE MINERALS CR-CU-F-FE-I-MN-MO-SE-ZN IV SOLN: INTRAVENOUS | Status: DC

## 2014-12-26 MED ORDER — HYDROMORPHONE HCL 1 MG/ML IJ SOLN
3.0000 mg | INTRAMUSCULAR | Status: DC | PRN
  Administered 2014-12-26: 3 mg via INTRAVENOUS
  Filled 2014-12-26: qty 3

## 2014-12-26 MED ORDER — INSULIN ASPART 100 UNIT/ML ~~LOC~~ SOLN
0.0000 [IU] | Freq: Three times a day (TID) | SUBCUTANEOUS | Status: DC
  Administered 2014-12-27 (×2): 3 [IU] via SUBCUTANEOUS

## 2014-12-26 MED ORDER — POTASSIUM CHLORIDE 20 MEQ/15ML (10%) PO SOLN: 40.0000 meq | Freq: Two times a day (BID) | ORAL | Status: DC

## 2014-12-26 MED ORDER — FAT EMULSION 20 % IV EMUL
240.0000 mL | INTRAVENOUS | Status: AC
  Administered 2014-12-26: 240 mL via INTRAVENOUS
  Filled 2014-12-26: qty 250

## 2014-12-26 MED ORDER — POTASSIUM CHLORIDE 10 MEQ/100ML IV SOLN
10.0000 meq | INTRAVENOUS | Status: AC
  Administered 2014-12-26 (×4): 10 meq via INTRAVENOUS
  Filled 2014-12-26 (×4): qty 100

## 2014-12-26 MED ORDER — ALBUMIN HUMAN 25 % IV SOLN
25.0000 g | Freq: Two times a day (BID) | INTRAVENOUS | Status: AC
  Administered 2014-12-26 – 2014-12-28 (×4): 25 g via INTRAVENOUS
  Filled 2014-12-26 (×4): qty 100

## 2014-12-26 MED ORDER — FAT EMULSION 20 % IV EMUL: 240.0000 mL | INTRAVENOUS | Status: DC

## 2014-12-26 MED ORDER — OXYCODONE HCL ER 15 MG PO T12A
15.0000 mg | EXTENDED_RELEASE_TABLET | Freq: Two times a day (BID) | ORAL | Status: DC
  Administered 2014-12-26 – 2014-12-28 (×4): 15 mg via ORAL
  Filled 2014-12-26 (×4): qty 1

## 2014-12-26 MED ORDER — TRACE MINERALS CR-CU-F-FE-I-MN-MO-SE-ZN IV SOLN
INTRAVENOUS | Status: AC
  Administered 2014-12-26: 19:00:00 via INTRAVENOUS
  Filled 2014-12-26: qty 1440

## 2014-12-26 MED ORDER — SULFAMETHOXAZOLE-TRIMETHOPRIM 800-160 MG PO TABS
1.0000 | ORAL_TABLET | Freq: Two times a day (BID) | ORAL | Status: DC
  Administered 2014-12-26 – 2014-12-30 (×8): 1 via ORAL
  Filled 2014-12-26 (×8): qty 1

## 2014-12-26 NOTE — Progress Notes (Signed)
ANTIBIOTIC CONSULT NOTE  Pharmacy Consult for Vancomycin/Zosyn Indication: wound infection  Allergies  Allergen Reactions  . Remicade [Infliximab]     Dr Karilyn Cota states previous respiratory arrest not related to remicade. Dr Karilyn Cota states remicade not a drug related allergy. 07-07-2013 at 1025 rapid response called to PACU- Patient difficulty breathing after infusion of Remicade infusing. Do NOT Give Remicade!  . Fentanyl And Related Itching    Swelling, Redness  . Alfentanil Itching    Swelling, Redness  . Wellbutrin [Bupropion] Nausea And Vomiting  . Morphine And Related Hives   Patient Measurements: Height:  (180.3 cm) Weight: 192 lb 7.4 oz (87.3 kg) IBW/kg (Calculated) : 75.3  Vital Signs: Temp: 98.1 F (36.7 C) (01/13 0548) Temp Source: Oral (01/13 0548) BP: 101/66 mmHg (01/13 0548) Pulse Rate: 75 (01/13 0548) Intake/Output from previous day: 01/12 0701 - 01/13 0700 In: 360 [P.O.:360] Out: 3800 [Urine:2200; Drains:1600] Intake/Output from this shift:    Labs:  Recent Labs  12/24/14 0649 12/25/14 0607 12/26/14 0638  WBC 5.1  --   --   HGB 8.5*  --   --   PLT 69*  --   --   CREATININE 0.49* 0.55 0.54   Estimated Creatinine Clearance: 133.3 mL/min (by C-G formula based on Cr of 0.54).  Recent Labs  12/23/14 1120  VANCOTROUGH 16.8    Microbiology: Recent Results (from the past 720 hour(s))  Blood culture (routine x 2)     Status: None   Collection Time: 12/09/14  5:36 PM  Result Value Ref Range Status   Specimen Description BLOOD RIGHT ANTECUBITAL  Final   Special Requests BOTTLES DRAWN AEROBIC AND ANAEROBIC 10CC  Final   Culture NO GROWTH 6 DAYS  Final   Report Status 12/15/2014 FINAL  Final  Blood culture (routine x 2)     Status: None   Collection Time: 12/09/14  5:45 PM  Result Value Ref Range Status   Specimen Description BLOOD LEFT HAND  Final   Special Requests BOTTLES DRAWN AEROBIC ONLY 5CC  Final   Culture NO GROWTH 6 DAYS  Final   Report Status 12/15/2014 FINAL  Final  Blood culture (routine x 2)     Status: None   Collection Time: 12/15/14  2:09 PM  Result Value Ref Range Status   Specimen Description BLOOD PORTA CATH DRAWN BY RN  Final   Special Requests BOTTLES DRAWN AEROBIC ONLY 6CC  Final   Culture NO GROWTH 5 DAYS  Final   Report Status 12/20/2014 FINAL  Final  Blood culture (routine x 2)     Status: None   Collection Time: 12/15/14  2:15 PM  Result Value Ref Range Status   Specimen Description BLOOD LEFT ANTECUBITAL  Final   Special Requests BOTTLES DRAWN AEROBIC ONLY 6CC  Final   Culture NO GROWTH 5 DAYS  Final   Report Status 12/20/2014 FINAL  Final  MRSA PCR Screening     Status: None   Collection Time: 12/15/14  6:00 PM  Result Value Ref Range Status   MRSA by PCR NEGATIVE NEGATIVE Final    Comment:        The GeneXpert MRSA Assay (FDA approved for NASAL specimens only), is one component of a comprehensive MRSA colonization surveillance program. It is not intended to diagnose MRSA infection nor to guide or monitor treatment for MRSA infections.   Culture, respiratory (NON-Expectorated)     Status: None   Collection Time: 12/15/14  8:00 PM  Result Value  Ref Range Status   Specimen Description SPUTUM  Final   Special Requests NONE  Final   Gram Stain   Final    ABUNDANT WBC PRESENT, PREDOMINANTLY PMN NO SQUAMOUS EPITHELIAL CELLS SEEN MODERATE YEAST FEW GRAM NEGATIVE RODS FEW GRAM POSITIVE RODS    Culture   Final    NORMAL OROPHARYNGEAL FLORA Performed at [facility]    Report Status 12/18/2014 FINAL  Final  Wound culture     Status: None   Collection Time: 12/21/14  6:44 AM  Result Value Ref Range Status   Specimen Description G/T SITE  Final   Special Requests Normal  Final   Gram Stain   Final    FEW WBC PRESENT,BOTH PMN AND MONONUCLEAR NO SQUAMOUS EPITHELIAL CELLS SEEN NO ORGANISMS SEEN Performed at [facility]    Culture   Final    MULTIPLE ORGANISMS  PRESENT, NONE PREDOMINANT Note: NO STAPHYLOCOCCUS AUREUS ISOLATED NO GROUP A STREP (S.PYOGENES) ISOLATED Performed at [facility]    Report Status 12/24/2014 FINAL  Final  Culture, Urine     Status: None   Collection Time: 12/21/14  7:35 AM  Result Value Ref Range Status   Specimen Description URINE, CLEAN CATCH  Final   Special Requests Normal  Final   Colony Count   Final    2,000 COLONIES/ML Performed at [facility]    Culture   Final    INSIGNIFICANT GROWTH Performed at [facility]    Report Status 12/22/2014 FINAL  Final  Culture, blood (routine x 2)     Status: None (Preliminary result)   Collection Time: 12/21/14  8:26 AM  Result Value Ref Range Status   Specimen Description BLOOD LEFT ANTECUBITAL  Final   Special Requests BOTTLES DRAWN AEROBIC AND ANAEROBIC 10CC BOTTLES  Final   Culture NO GROWTH 4 DAYS  Final   Report Status PENDING  Incomplete  Culture, blood (routine x 2)     Status: None (Preliminary result)   Collection Time: 12/21/14  8:31 AM  Result Value Ref Range Status   Specimen Description BLOOD LEFT HAND  Final   Special Requests BOTTLES DRAWN AEROBIC ONLY 6CC BOTTLE  Final   Culture NO GROWTH 4 DAYS  Final   Report Status PENDING  Incomplete  Clostridium Difficile by PCR     Status: None   Collection Time: 12/21/14  1:56 PM  Result Value Ref Range Status   C difficile by pcr NEGATIVE NEGATIVE Final  Wound culture     Status: None (Preliminary result)   Collection Time: 12/24/14  2:30 PM  Result Value Ref Range Status   Specimen Description WOUND GASTROSTOMY TUBE SITE  Final   Special Requests NONE  Final   Gram Stain   Final    RARE WBC PRESENT, PREDOMINANTLY MONONUCLEAR NO SQUAMOUS EPITHELIAL CELLS SEEN NO ORGANISMS SEEN Performed at [facility]    Culture   Final    Culture reincubated for better growth Performed at [facility]    Report Status PENDING  Incomplete   Medical History: Past  Medical History  Diagnosis Date  . 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 Hepatomegaly 05/04/2011  . Fatty liver 05/04/2011    "stage III fatty liver fibrosis" (11/15/2012)  . GERD (gastroesophageal reflux disease)   . Chronic liver disease     /notes 11/15/2012  . Pericarditis     /  notes 11/15/2012  . Hypertension   . Pneumonia 1977  . Shortness of breath     "all the time right now" (11/15/2012)  . History of blood transfusion 2004  . History of stomach ulcers   . Duodenal ulcer   . Depression   . Kidney stones     bilaterally/notes 11/15/2012  . Hepatic fibrosis     [name] 11/15/2012  . Anemia   . Pericardial effusion 10/29/2012    moderate to large/notes 11/15/2012  . Anxiety   . Hepatitis   . Crohn's disease   . ED (erectile dysfunction)   . Cirrhosis     secondary to drug effect, +/- fatty liver   Medications:  Scheduled:  . acidophilus  1 capsule Oral Daily  . furosemide  40 mg Intravenous BID  . hydrocortisone sod succinate (SOLU-CORTEF) inj  50 mg Intravenous Daily  . insulin aspart  0-20 Units Subcutaneous Q8H  . metoCLOPramide (REGLAN) injection  5 mg Intravenous 3 times per day  . nystatin-triamcinolone   Topical BID  . oxyCODONE  20 mg Oral QID  . pantoprazole (PROTONIX) IV  40 mg Intravenous BID  . piperacillin-tazobactam  3.375 g Intravenous Q8H  . potassium chloride  10 mEq Intravenous Q1 Hr x 4  . potassium chloride  40 mEq Oral BID  . sodium chloride  10 mL Intravenous Q12H  . sodium chloride  3 mL Intravenous Q12H  . spironolactone  25 mg Oral BID  . vancomycin  1,000 mg Intravenous Q8H  . zolpidem  10 mg Oral QHS   Assessment: 49 yoM with complicated GI history well known to pharmacy from TPN monitoring.  He has been on abx therapy for asp PNA which was switched to oral Augmentin on 1/5.  Patient is clinically improved from this standpoint. He began complaining of  increased pain, burning at G-tube site on 1/8.  Purulent drainage was noted and patient had low grade fever (101.3 F rectally).  WBC remains normal.  Cx data pending.  Antibiotic coverage was changed to Vancomycin & Zosyn.   Renal function has been stable at patient's baseline since admission.  Vancomycin trough at goal when checked.  Vancomycin 1/8>> Augmentin 1/5>>1/8 Unasyn 1/8>>1/10 Zosyn 1/2>>1/5; 1/10>>  Goal of Therapy:  Vancomycin trough level 15-20 mcg/ml  Eradicate infection.  Plan:  Continue Vancomycin 1gm IV q8h Check weekly Vancomycin trough Continue Zosyn 3.375gm IV Q8h to be infused over 4hrs Monitor renal function and cx data  Duration of therapy per MD *May consider alternate drug class such as Doripenem if patient continues to spike temp, wound infection not improving on Vanc/Zosyn.  Margo Aye, Wessie Shanks A 12/26/2014,10:55 AM

## 2014-12-26 NOTE — Progress Notes (Signed)
  Subjective:  Patient continues to complain of abdominal pain which is centered right half of his abdomen. He had three loose stools during the night. He is taking liquids. If he does not get some or pains. He states lower extremity and scrotal edema is unchanged. He continues to complain of insomnia.   Objective: Blood pressure 106/75, pulse 81, temperature 98.3 F (36.8 C), temperature source Oral, resp. rate 118, height 5\' 11"  (1.803 m), weight 192 lb 7.4 oz (87.3 kg), SpO2 99 %. Patient is alert and in no acute distress. Conjunctiva is pale. Sclerae nonicteric. Abdomen abdomen is full. Bowel sounds are normal. He has scant mucopurulent discharge around the gastrostomy tube site. There is moderate tenderness with guarding in right upper and lower quadrant. No mass palpated. Mild tenderness noted across left of the abdomen but this site is soft. Lower abdominal wall edema is unchanged. Mild scrotal and penile edema noted.  3+ pitting edema and noted to both lower extremities  Labs/studies Results:   Recent Labs  12/24/14 0649  WBC 5.1  HGB 8.5*  HCT 27.2*  PLT 69*    BMET   Recent Labs  12/24/14 0649 12/25/14 0607 12/26/14 0638  NA 132* 134* 134*  K 3.0* 3.3* 2.9*  CL 95* 96 96  CO2 31 33* 33*  GLUCOSE 165* 112* 105*  BUN 14 13 11   CREATININE 0.49* 0.55 0.54  CALCIUM 7.3* 7.3* 7.4*    LFT   Recent Labs  12/24/14 0649  PROT 4.5*  ALBUMIN 2.2*  AST 22  ALT 32  ALKPHOS 139*  BILITOT 1.0    Acute abdominal series reviewed with Dr. Charlett Nose.  Two dilated loops of small bowel in upper mid abdomen with air-fluid levels.. Gastrostomy tube is in place.   blood cultures remain negative. No predominant organism cultured from the wound site. Assessment:  #1. Fluid overload. He remains with significant lower extremity and truncal edema. TPN fluid rate was decreased yesterday and he has been switched to oral antibiotic. #2. Fever secondary to infection at  gastrostomy tube site. He has been afebrile for 72 hours.  Therefore agree with switching him to #3. Crohn's disease with strictures resulting in nonfunctional GI tract. Acute abdominal series revealed progressive dilation to two loops of small bowel. #4. 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 Chronic liver disease and hypoalbuminemia contribution into fluid attention and third spacing. He may benefit from IV albumin infusion. #5. Abdominal pain. Acute on chronic abdominal pain. Pain would appear to be secondary to IBD. He does not have significant ascites. #6. Hypokalemia. Potassium potassium is low and he is receiving runs of IV KCl in addition to potassium and parenteral nutrition. #7. Diarrhea. Patient has been on antibiotics for several days. C. difficile 5 days ago was negative. C. difficile is to be repeated. #8. Anemia. It is secondary to iron deficiency and chronic disease. No evidence of active bleed.  Recommendations:  I was able to contact Dr. Marval Regal of [facility] transplant service. He advised that I contact medical service and request transfer. Consider IV albumin 25 g daily for 3 days. Stool for C.Diff by PCR.

## 2014-12-26 NOTE — Progress Notes (Signed)
PARENTERAL NUTRITION CONSULT NOTE  Pharmacy Consult for TPN Indication: prolonged ileus  Allergies  Allergen Reactions  . Remicade [Infliximab]     Dr Karilyn Cota states previous respiratory arrest not related to remicade. Dr Karilyn Cota states remicade not a drug related allergy. 07-07-2013 at 1025 rapid response called to PACU- Patient difficulty breathing after infusion of Remicade infusing. Do NOT Give Remicade!  . Fentanyl And Related Itching    Swelling, Redness  . Alfentanil Itching    Swelling, Redness  . Wellbutrin [Bupropion] Nausea And Vomiting  . Morphine And Related Hives   Patient Measurements: Height: 5\' 11"  (180.3 cm) Weight: 192 lb 7.4 oz (87.3 kg) IBW/kg (Calculated) : 75.3  Usual body weight ~ 100kg  Vital Signs: Temp: 98.1 F (36.7 C) (01/13 0548) Temp Source: Oral (01/13 0548) BP: 101/66 mmHg (01/13 0548) Pulse Rate: 75 (01/13 0548) Intake/Output from previous day: 01/12 0701 - 01/13 0700 In: 360 [P.O.:360] Out: 3800 [Urine:2200; Drains:1600] Intake/Output from this shift:    Labs:  Recent Labs  12/24/14 0649  WBC 5.1  HGB 8.5*  HCT 27.2*  PLT 69*     Recent Labs  12/24/14 0649 12/25/14 0607 12/26/14 0638  NA 132* 134* 134*  K 3.0* 3.3* 2.9*  CL 95* 96 96  CO2 31 33* 33*  GLUCOSE 165* 112* 105*  BUN 14 13 11   CREATININE 0.49* 0.55 0.54  CALCIUM 7.3* 7.3* 7.4*  MG 1.5  --   --   PHOS 2.6  --   --   PROT 4.5*  --   --   ALBUMIN 2.2*  --   --   AST 22  --   --   ALT 32  --   --   ALKPHOS 139*  --   --   BILITOT 1.0  --   --   PREALBUMIN 9.5*  --   --   TRIG 78  --   --    Estimated Creatinine Clearance: 133.3 mL/min (by C-G formula based on Cr of 0.54).    Recent Labs  12/25/14 1714 12/25/14 2352 12/26/14 0535  GLUCAP 120* 185* 124*   Medical History: Past Medical History  Diagnosis Date  . 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  Hepatomegaly 05/04/2011  . Fatty liver 05/04/2011    "stage III fatty liver fibrosis" (11/15/2012)  . GERD (gastroesophageal reflux disease)   . Chronic liver disease     /notes 11/15/2012  . Pericarditis     [name] 11/15/2012  . Hypertension   . Pneumonia 1977  . Shortness of breath     "all the time right now" (11/15/2012)  . History of blood transfusion 2004  . History of stomach ulcers   . Duodenal ulcer   . Depression   . Kidney stones     bilaterally/notes 11/15/2012  . Hepatic fibrosis     [name] 11/15/2012  . Anemia   . Pericardial effusion 10/29/2012    moderate to large/notes 11/15/2012  . Anxiety   . Hepatitis   . Crohn's disease   . ED (erectile dysfunction)   . 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 States that he just been seen at [facility] 12/05/14 to change out his PEG tube to a larger size but was not done due to low BP.  Was sent home to be with family over holidays. Over that period of time he progressively got weaker and weaker and felt more ill or nauseous and was unable to keep anything down.  He was hospitalized at [facility] 12/27-31 and discharged with TPN but was readmitted due to choking and possible aspiration after eating at home.   Evaluation at [facility] for liver/colon transplant is pending.   Na chronically low but stable.  K+ remains low today despite K+ runs yesterday.  On Lasix. CBGs mildly increased with addition of Solu-Cortef and correction insulin given as needed. 24hr I/O -3440ml  Dr Rehman spoke with clinical pharmacist  12/24/14. Says pt still edematous, 8# wt gain since Fri despite good UOP. I/O remains (-)  ReqNeesTreBolivar General HosLittle IshikStamford HospitalThe Pavilion FoundationASUREMENCedaKentuc7253Saint MaKentuckyrtinSurgical Hospital At Southw TTAG>nd unfoOcean RidTreMs Band Of Choctaw HosLittle IshikOutpatient Womens And Childrens Surgery Center LtdHeart Hospital Of LafayetteEHighlandKentuc7253Saint MaKentuckyrtinSt Elizabeth Boardman Health C TTAG>nal Needs: Kcal: 2100-2<MEAWhite HavTreGlobal Microsurgical CenteLittle IshikRaleigh Endoscopy CeDustin AcrTreSurgery Center Of NLittle IshikPremier Surgery Center LLCCarolinaHoliday PocoTreJewishLittle IshikPlains RegiShenandoTreTuscaloosa Va Medical CLittle IshikAlvarado Hospital Medical CenterBerkshire Medical Center - HiLLCrest Campusr ClovisS<MEAKentuc7253Saint MaKentuckyrtinLake City Surgery Center L TTAG>de lipids, trace elements, and MVI with TPN  CoHighland FalTreNortheast Regional Medical CLittle IshikForbeSabana HoyTreShore Rehabilitation InstLittle IshikSCamp Pendleton SouTreSurgical Arts CLittle IsVidalTreAdvanced Surgery Center Of SarasotLittle IshikNexus Specialty Hospital - The WoodlandsEncompass Health Rehabilitation Hospital Of Texarkana Hospital<MWhiKentuc7253Saint MaKentuckyrtinThree Rivers Hospital610A TTAG>ry Center Tennova LauniupoTreUrology Surgical PartnerLittle IshikUs Air Force HGoldendaTreVictory Medical Center Craig Little IshikNortheast Rehabilitation HospitalPerry Point Va Medical Centerion Regio<MEASUREMENTKentuc7253Saint MaKentuckyrtinWenatchee Valley H TTAG>T>TonVid<MEASUREMKentuc7253Saint SoldotTreUniversity Medical Ctr MLittle IshikAdventhealth Shawnee Mission Medical CenterMitchell County Hospitalritan North Surgery Center Ltd610AugustoGlenKentuckyford086Granite Ci TTAG>MEASUREMENT>TonLaurel Oaks ArtesTreMarshall Browning HosLittle IshikNorth Country Hospital & Health CenteMount WoTreCarrus Rehabilitation HosLittle IshikRoper St Francis Berkeley HospitalStarr Regional Medical Center Etowah253Saint MaKentuckyrtinBozeman Deaconess Hospital610AugustoGlenKentuc TTAG>T>TonMemorial Hermann Bay Area EndosCedar HilTreAbilene White Rock Surgery CenteLittle IshikGrinnell General HospitalBuffalo HospitalASUMorKentuc7253Saint MaKentuckyrtinEl Paso Surgery Centers LP610Augu TTAG>broadcaL4VernTrePortland Va Medical CLittle IshikDanbury HospitalLegent Orthopedic + SpineMEASWhiKentuc7253Saint MaKentuckyrtinMayo Clinic Health System-Oakrid TTAG>oadcaL45 TonCarrollton [phone]<MEASUREMBlackTreKeller Army Community HosLittle IshikTexas Health Surgery Center Fort Worth MidtownSaint Lukes South Surgery Center LLCaint MaKentuckyrtinCamp Lowell Surgery Center LLC Dba Camp Lowell Sur TTAG>id [phone] Specialty Hospital -OklahomBeverely 4Programme researcher, broadcaL45 TonIronbound Endosurgical Center Hancock RegionBuffa305 [phone] ([phone] Arkansas Regional Medical Beverely 983 LaProgramme researcher, broadcaL45 TonGenesis Behavioral HospiSt MaShark R561<MEASUREMuskegonKentMammoTrTresa EnAkron Children'S HospitalLake Charles Memorial Hospitalst Texas JacksonLittle IshikNorth River Surgery CenterMemorial Health Univ spital NortheEncompass Health Rehabilitation [phone] Lakes Endoscopy Beverely 930 CProgramme researcher, broadcaL45 TonWest Jefferson [phone] Health SeBeverely 4 S. LProgramme researcher, broadcaL45 TonClay County [phone] Memorial HoBeverely 114 Programme researcher, broadcaL45 TonTrinity Medical Center - [address]Th [address] Campus - Dba Trinity MolC S MedicPaislTreGuilord Endoscopy CLittle IshikHampton Roads Specialty HospitalMorris Villagenson<MEASURKentuc7253Saint MaKentuckyrtinProcedure Center Of Irvine61 TTAG>inridge Memorial HospiPremier SpecialtSouth [phone] Behavioral Hospital Of EastBeverely 4 RocProgramme researcher, broadcaL45 TonFlorence Hospital At Ant820 486 20Huntington Beach HoBeverely Programme researcher, broadcaL45 TonAria Health ParkstTreCentracare Health SystemLittle IshikCurahealth Heritage ValleyIncline Village Health Centerit43<MEASUREKentuc7253Saint MaKentuckyrtinGreene County Medical Cente TTAG>ye Laser Surgery Center North State Surgery Centers LP [phone] Plastic Surgery CentBeverely 971Programme researcher, broadcaL45<MEASUREMEWest PerriTreHighland Community HosLittle IshikMemorial Hospital Of Sweetwater CountyOklRosalTreLargo Medical Center - Indian Little IshikMemorial Hermann Specialty Hospital KingwoodGood Samaritan <MEASUREMHaddon HeighTreCentral Ohio Surgical InstLittle IshikZachary - Amg Specialty HospitalHumboldt General Hospitalint MaKentuckyrtinSelect Specialty Hospital Of Wilmington610AugustoGl<BADTEXTKinTreSequoia Surgical PavLittle IshikSalina Regional Health CenterPresidio Surgery Center LLCUREMENT>TonGu<MKentuc7253Saint MaKentuckyrtinNanticoke Memorial Hospi TTAG>EASUREMENT>TonOrthoindy HospiBay Area Endoscopy [phone] Island Univ Hosp-ConcoBeverely 96Programme researcher, broadcaL45 TonBaylor Tykel Badie And White The Heart Hospital PlGrundy [phone] Ascutney Hospital & Health Beverely 988Programme researcher, broadcaL45<MCass LaTreEast Juniata Internal MediciLittle IshikAkron ChiBurbaTreMetropolitan Nashville General HosLittle IshikPueblo Ambulatory Surgery Center LLCAmbulatory Center For Endoscopy LLCckensack Univers<MEASUREReKentuc7253Saint MaKentuckyrtinMesa Az Endos TTAG>>TonCogdell Memorial HospiThe Center [phone] PoiTreUnion County General HosLittle IshikEye Surgery And Laser CenterFargoVesTreConnecticut Childbirth & Women'S CLittle IshikLexington Va Medical Center - LeestownHshs St ClarMoseleyvilTreHorn Memorial HosLittle IshikAnderson Endoscopy CenterChristus Health - Shrevepor-Bossieraint MaKentuckyrtinKaiser Fnd Hospital - Moreno Valley610AugustoGlenKClifton HeighTreCopper Queen Douglas Emergency DeparLittle IshikMemorial Hospital WestWest Tennessee Healthcare - Volunteer Hospital07Titusville Cen<MEASUREMEKentuc7253Saint MaKentuckyrtinBellin Psychi TTAG>CenRiver [phone]. David'S Medical CLittle IshikGrandview Hospital & Medical CenterSouthern Woodlawn BeaTreMedical Arts Surgery CLittle IshikEndoscopy Center Of Knoxville LPSkin Cancer AnIgnacTreVibra Hospital Of Southeastern Michigan-Dmc CLittle IshikAurora Psychiatric HsptlMaine Eye Care AssociatesMKentuc7253Saint MaKentuckyrtinUltimate Health Services Inc610Augusto TTAG>thFair PlTreGastroenterology Diagnostic Center Medical Little IshikPioneer Valley Surgicenter LLCWestchester Medical CenterAKentuc7253Saint MaKentuckyrtinOrthopaedic Spine Center Of The Rockie TTAG>s Gardens HospiFreeway Surgery Center LLC DbVioTreSanta Rosa Memorial Hospital-SotLittle IshikWestwood/Pembroke Health System PembrokeSurgery Ce<MKentuc7253SaintReynoldsvilTreKaiser Fnd Hosp - Mental Health CLittle IshikSouthern California Hospital At Culver CityNorthport Medical Centerdalupe Regional Medical Center610AugustoGlenKentuckyford086New Cedar TTAG>alth Care Center [phone] Jefferson HoBeverely 9Programme researcPerrTreEndoscopy Center Of Toms Little IshikMontrose Memorial HospitalFairmont General Hospitalc7253Saint MaKentuckyrtinMedstar Montgomery Medical Center610AugustoG TTAG>erely 24 SunnProgramme researcher, broadcaL45 TonRome Memorial [phone] Plastic Surgery Beverely 7248 StiProgramme researcher, broadcaL45 TonGuam Memorial Hospital AuthorChristus Santa Rosa Outpatient [phone]'S National Emergency Department At United Medical Beverely 2Programme researcher, broadcaL45 TonMemorial Hermann Endoscopy And Surgery Center North Houston LLC Dba North Houston Endoscopy And SurgUt [phone] Of GeorgBeverely 1Programme researcher, broadcaL45409sa Lenzggym/videoburgdose  Monitor lytes, triglycerides, glucose tolerance, renal fxn, and fluid status  F/U fluid status with Dr Rehman prior to any rate increases.  Columbia Pandey A 12/26/2014,10:46 AM

## 2014-12-26 NOTE — Progress Notes (Signed)
[facility] PROGRESS NOTE  David [name]:[id number] DOB: [birth year] DOA: 12/15/2014 PCP: [name], MD  Assessment/Plan: 1. Cellulitis around gastrostomy tube. Cultures have shown no specific growth. He has been afebrile with a normal WBC count. Currently on broad-spectrum intravenous antibiotics. We will narrow antibiotics to oral Bactrim. 2. Aspiration pneumonia. Admitted with shortness of breath and chest x-ray findings of aspiration. No hypoxia or respiratory symptoms. Appears to have resolved with treatment. He is continued as nothing by mouth. 3. Nutrition. Currently on TPN. Gastrostomy tube is in place for decompression. 4. Cirrhosis. Felt to be related to methotrexate use in the past.  5. Anemia of chronic disease with iron deficiency. No evidence of bleeding. Continue to monitor. 6. Anasarca. Contributory factor may be hypoalbuminemia related to cirrhosis. Discussed with GI and will start on albumin infusions. Increase Lasix to 40 mg 3 times a day. Strict monitoring of intake and output. Aldactone is also been added to his regimen. 7. End-stage Crohn's disease. Appreciate GI assistance. 8. Chronic steroid therapy for Crohn's disease. Currently on intravenous hydrocortisone. 9. Gastroparesis. Stable. Continue Reglan, PPI, Carafate.   Code Status: full code Family Communication: discussed with patient and wife Disposition Plan: possible transfer to [facility]   Consultants:  Gastroenterology  Procedures:    Antibiotics:  Unasyn 1/8 >> 1/13  Vancomycin 1/8 >> 1/13  Zosyn 1/2 >> 1/5  Augmentin 1/5 >> 1/8  Bactrim 1/13>>  HPI/Subjective: Continues to have abdominal pain, no vomiting, no shortness of breath or cough, does not feel that peripheral edema is improving.  Objective: Filed Vitals:   12/26/14 1301  BP: 106/75  Pulse: 81  Temp: 98.3 F (36.8 C)  Resp: 118    Intake/Output Summary (Last 24 hours) at 12/26/14 1828 Last data filed at  12/25/14 2030  Gross per 24 hour  Intake      0 ml  Output   2400 ml  Net  -2400 ml   Filed Weights   12/25/14 0830 12/26/14 0548 12/26/14 1715  Weight: 85.367 kg (188 lb 3.2 oz) 87.3 kg (192 lb 7.4 oz) 85.503 kg (188 lb 8 oz)    Exam:   General:  NAD  Cardiovascular: S1, s2 RRR  Respiratory: CTA B  Abdomen: distended, diffusely tender, bs+  Musculoskeletal: 2-3+ edema b/l in LE   Data Reviewed: Basic Metabolic Panel:  Recent Labs Lab 12/20/14 0551  12/22/14 0601 12/23/14 0612 12/24/14 0649 12/25/14 0607 12/26/14 0638  NA 132*  < > 131* 130* 132* 134* 134*  K 3.9  < > 2.9* 3.0* 3.0* 3.3* 2.9*  CL 99  < > 95* 93* 95* 96 96  CO2 29  < > 32 33* 31 33* 33*  GLUCOSE 114*  < > 106* 132* 165* 112* 105*  BUN 17  < > CREATININE 0.54  < > 0.53 0.50 0.49* 0.55 0.54  CALCIUM 7.5*  < > 7.2* 7.0* 7.3* 7.3* 7.4*  MG 1.4*  --   --  1.6 1.5  --   --   PHOS 2.6  --   --   --  2.6  --   --   < > = values in this interval not displayed. Liver Function Tests:  Recent Labs Lab 12/20/14 0551 12/24/14 0649  AST 44* 22  ALT 67* 32  ALKPHOS 111 139*  BILITOT 0.8 1.0  PROT 4.3* 4.5*  ALBUMIN 2.3* 2.2*    Recent Labs Lab 12/25/14 0607  LIPASE 16  No results for input(s): AMMONIA in the last 168 hours. CBC:  Recent Labs Lab 12/20/14 0551 12/22/14 0601 12/24/14 0649  WBC 6.3 5.0 5.1  NEUTROABS  --  3.6 4.1  HGB 8.1* 8.5* 8.5*  HCT 26.5* 27.1* 27.2*  MCV 87.2 86.3 85.3  PLT 75* 70* 69*   Cardiac Enzymes: No results for input(s): CKTOTAL, CKMB, CKMBINDEX, TROPONINI in the last 168 hours. BNP (last 3 results) No results for input(s): PROBNP in the last 8760 hours. CBG:  Recent Labs Lab 12/25/14 1117 12/25/14 1714 12/25/14 2352 12/26/14 0535 12/26/14 1149  GLUCAP 83 120* 185* 124* 95    Recent Results (from the past 240 hour(s))  Wound culture     Status: None   Collection Time: 12/21/14  6:44 AM  Result Value Ref Range Status    Specimen Description G/T SITE  Final   Special Requests Normal  Final   Gram Stain   Final    FEW WBC PRESENT,BOTH PMN AND MONONUCLEAR NO SQUAMOUS EPITHELIAL CELLS SEEN NO ORGANISMS SEEN Performed at [facility]    Culture   Final    MULTIPLE ORGANISMS PRESENT, NONE PREDOMINANT Note: NO STAPHYLOCOCCUS AUREUS ISOLATED NO GROUP A STREP (S.PYOGENES) ISOLATED Performed at [facility]    Report Status 12/24/2014 FINAL  Final  Culture, Urine     Status: None   Collection Time: 12/21/14  7:35 AM  Result Value Ref Range Status   Specimen Description URINE, CLEAN CATCH  Final   Special Requests Normal  Final   Colony Count   Final    2,000 COLONIES/ML Performed at [facility]    Culture   Final    INSIGNIFICANT GROWTH Performed at [facility]    Report Status 12/22/2014 FINAL  Final  Culture, blood (routine x 2)     Status: None   Collection Time: 12/21/14  8:26 AM  Result Value Ref Range Status   Specimen Description BLOOD LEFT ANTECUBITAL  Final   Special Requests BOTTLES DRAWN AEROBIC AND ANAEROBIC 10CC BOTTLES  Final   Culture NO GROWTH 5 DAYS  Final   Report Status 12/26/2014 FINAL  Final  Culture, blood (routine x 2)     Status: None   Collection Time: 12/21/14  8:31 AM  Result Value Ref Range Status   Specimen Description BLOOD LEFT HAND  Final   Special Requests BOTTLES DRAWN AEROBIC ONLY 6CC BOTTLE  Final   Culture NO GROWTH 5 DAYS  Final   Report Status 12/26/2014 FINAL  Final  Clostridium Difficile by PCR     Status: None   Collection Time: 12/21/14  1:56 PM  Result Value Ref Range Status   C difficile by pcr NEGATIVE NEGATIVE Final  Wound culture     Status: None (Preliminary result)   Collection Time: 12/24/14  2:30 PM  Result Value Ref Range Status   Specimen Description WOUND GASTROSTOMY TUBE SITE  Final   Special Requests NONE  Final   Gram Stain   Final    RARE WBC PRESENT, PREDOMINANTLY MONONUCLEAR NO SQUAMOUS  EPITHELIAL CELLS SEEN NO ORGANISMS SEEN Performed at [facility]    Culture   Final    Culture reincubated for better growth Performed at [facility]    Report Status PENDING  Incomplete     Studies: Dg Abd Acute W/chest  12/26/2014   CLINICAL DATA:  Small bowel obstruction. Right side abdominal pain. Former smoker, pneumonia.  EXAM: ACUTE ABDOMEN SERIES (ABDOMEN  2 VIEW & CHEST 1 VIEW)  COMPARISON:  CT 12/23/2014.  FINDINGS: Air-fluid levels are noted in the mid abdomen. Mildly dilated mid abdominal small bowel loops on the supine view. Findings compatible with small bowel obstruction. Relative paucity of gas throughout the colon. No free air. Gastrostomy tube projects over the distal stomach. Prior cholecystectomy. No organomegaly or suspicious calcification.  Heart is normal size. Patchy airspace disease in the right lung base. Left lung is clear. No effusions. No acute bony abnormality. Right PICC line is in place with the tip in the lower SVC, stable.  IMPRESSION: Small bowel obstruction pattern, similar to prior CT.  Right basilar opacity.  Cannot exclude pneumonia.   Electronically Signed   By: Charlett Nose M.D.   On: 12/26/2014 11:52    Scheduled Meds: . acidophilus  1 capsule Oral Daily  . albumin human  25 g Intravenous BID  . furosemide  40 mg Intravenous TID  . hydrocortisone sod succinate (SOLU-CORTEF) inj  50 mg Intravenous Daily  . insulin aspart  0-20 Units Subcutaneous Q8H  . metoCLOPramide (REGLAN) injection  5 mg Intravenous 3 times per day  . nystatin-triamcinolone   Topical BID  . OxyCODONE  15 mg Oral Q12H  . oxyCODONE  20 mg Oral QID  . pantoprazole (PROTONIX) IV  40 mg Intravenous BID  . potassium chloride  40 mEq Oral TID  . sodium chloride  10 mL Intravenous Q12H  . sodium chloride  3 mL Intravenous Q12H  . spironolactone  25 mg Oral BID  . sulfamethoxazole-trimethoprim  1 tablet Oral Q12H  . zolpidem  10 mg Oral QHS   Continuous  Infusions: . Marland KitchenTPN (CLINIMIX-E) Adult     And  . fat emulsion      Principal Problem:   Aspiration pneumonia Active Problems:   Crohn's disease of both small and large intestine with fistula   Chronic right lower quadrant pain   Hypertension   Liver cirrhosis   Gastroparesis   Aspiration into airway   Anasarca   Anemia of chronic disease   Abdominal pain, generalized   Infection    Time spent:    MEMON,JEHANZEB  [facility] Pager [phone]. If 7PM-7AM, please contact night-coverage at [email or web address], password [facility] 12/26/2014, 6:28 PM  LOS: 11 days

## 2014-12-27 DIAGNOSIS — R601 Generalized edema: Secondary | ICD-10-CM

## 2014-12-27 DIAGNOSIS — D638 Anemia in other chronic diseases classified elsewhere: Secondary | ICD-10-CM

## 2014-12-27 DIAGNOSIS — K50813 Crohn's disease of both small and large intestine with fistula: Secondary | ICD-10-CM

## 2014-12-27 DIAGNOSIS — J690 Pneumonitis due to inhalation of food and vomit: Secondary | ICD-10-CM

## 2014-12-27 DIAGNOSIS — K3184 Gastroparesis: Secondary | ICD-10-CM

## 2014-12-27 LAB — COMPREHENSIVE METABOLIC PANEL
ALBUMIN: 2.8 g/dL — AB (ref 3.5–5.2)
ALT: 34 U/L (ref 0–53)
AST: 27 U/L (ref 0–37)
Alkaline Phosphatase: 195 U/L — ABNORMAL HIGH (ref 39–117)
Anion gap: 5 (ref 5–15)
BILIRUBIN TOTAL: 0.7 mg/dL (ref 0.3–1.2)
BUN: 12 mg/dL (ref 6–23)
CALCIUM: 7.6 mg/dL — AB (ref 8.4–10.5)
CO2: 34 mmol/L — ABNORMAL HIGH (ref 19–32)
CREATININE: 0.66 mg/dL (ref 0.50–1.35)
Chloride: 98 mEq/L (ref 96–112)
GFR calc Af Amer: >90 mL/min (ref >90)
GFR calc non Af Amer: >90 mL/min (ref >90)
GLUCOSE: 121 mg/dL — AB (ref 70–99)
Potassium: 3.5 mmol/L (ref 3.5–5.1)
Sodium: 137 mmol/L (ref 135–145)
Total Protein: 5.2 g/dL — ABNORMAL LOW (ref 6.0–8.3)

## 2014-12-27 LAB — CBC
HCT: 27.9 % — ABNORMAL LOW (ref 39.0–52.0)
Hemoglobin: 8.3 g/dL — ABNORMAL LOW (ref 13.0–17.0)
MCH: 25.9 pg — AB (ref 26.0–34.0)
MCHC: 29.7 g/dL — ABNORMAL LOW (ref 30.0–36.0)
MCV: 86.9 fL (ref 78.0–100.0)
Platelets: 103 10*3/uL — ABNORMAL LOW (ref 150–400)
RBC: 3.21 MIL/uL — ABNORMAL LOW (ref 4.22–5.81)
RDW: 20.7 % — ABNORMAL HIGH (ref 11.5–15.5)
WBC: 5.3 10*3/uL (ref 4.0–10.5)

## 2014-12-27 LAB — GLUCOSE, CAPILLARY
GLUCOSE-CAPILLARY: 134 mg/dL — AB (ref 70–99)
GLUCOSE-CAPILLARY: 156 mg/dL — AB (ref 70–99)
GLUCOSE-CAPILLARY: 195 mg/dL — AB (ref 70–99)
Glucose-Capillary: 134 mg/dL — ABNORMAL HIGH (ref 70–99)
Glucose-Capillary: 134 mg/dL — ABNORMAL HIGH (ref 70–99)

## 2014-12-27 LAB — PHOSPHORUS: Phosphorus: 2.9 mg/dL (ref 2.3–4.6)

## 2014-12-27 LAB — MAGNESIUM: MAGNESIUM: 1.5 mg/dL (ref 1.5–2.5)

## 2014-12-27 MED ORDER — TRACE MINERALS CR-CU-F-FE-I-MN-MO-SE-ZN IV SOLN
INTRAVENOUS | Status: AC
Start: 2014-12-27 — End: 2014-12-28
  Administered 2014-12-27: 17:00:00 via INTRAVENOUS
  Filled 2014-12-27: qty 1440

## 2014-12-27 MED ORDER — FAT EMULSION 20 % IV EMUL
240.0000 mL | INTRAVENOUS | Status: AC
  Administered 2014-12-27: 240 mL via INTRAVENOUS
  Filled 2014-12-27: qty 250

## 2014-12-27 MED ORDER — MAGNESIUM SULFATE 2 GM/50ML IV SOLN
2.0000 g | Freq: Once | INTRAVENOUS | Status: AC
  Administered 2014-12-27: 2 g via INTRAVENOUS
  Filled 2014-12-27: qty 50

## 2014-12-27 NOTE — Progress Notes (Signed)
  Subjective:  Patient states abdominal pain is better controlled. He had 2 small stools today. Stools are loose. He says urine output record is not accurate.   Objective: BP 109/73 mmHg  Pulse 89  Temp(Src) 98.3 F (36.8 C) (Oral)  Resp 18  Ht 5\' 11"  (1.803 m)  Wt 195 lb (88.451 kg)  BMI 27.21 kg/m2  SpO2 97% Patient is alert and in no acute distress. Abdomen remains distended in upper and mid abdomen. Bowel sounds are normal. Scant amount of mucopurulent drainage noted around gastrostomy tube site. Note extremity edema remains unchanged. He has 3+ edema. Also has sacral edema.  Labs/studies Results:   Recent Labs  12/27/14 0538  WBC 5.3  HGB 8.3*  HCT 27.9*  PLT 103*    BMET   Recent Labs  12/25/14 0607 12/26/14 0638 12/27/14 0538  NA 134* 134* 137  K 3.3* 2.9* 3.5  CL 96 96 98  CO2 33* 33* 34*  GLUCOSE 112* 105* 121*  BUN 13 11 12   CREATININE 0.55 0.54 0.66  CALCIUM 7.3* 7.4* 7.6*    LFT   Recent Labs  12/27/14 0538  PROT 5.2*  ALBUMIN 2.8*  AST 27  ALT 34  ALKPHOS 195*  BILITOT 0.7    Bone culture reveals rare Pseudomonas aeruginosa.  Assessment:  #1. Fluid overload. Patient has not gained any more weight. Serum albumin is up to 2.8 g following IV albumin. This should help with fluid mobilization. #3. Crohn's disease with strictures resulting in nonfunctional GI tract.  #4. 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  #5. Abdominal pain. Acute on chronic abdominal pain. Patient remains on IV Dilaudid and oral oxycodone. Pain control appears to be better. #6. Hypokalemia. Serum potassium is corrected 3.5. #7. Diarrhea. Diarrhea has slowed. C. difficile by PCR yesterday was negative. #8. Anemia. It is secondary to iron deficiency and chronic disease. No evidence of active bleed.  Recommendations:  I talked withed with Dr. Jilda Roche of [facility] hospitalist service. All the information has been  provided and they will except patient in transfer when bed is available. Will increase TPN rate when fluids balance is improved and patient has lost at least 15-20 pounds.

## 2014-12-27 NOTE — Care Management Utilization Note (Signed)
UR completed 

## 2014-12-27 NOTE — Progress Notes (Signed)
PARENTERAL NUTRITION CONSULT NOTE  Pharmacy Consult for TPN Indication: prolonged ileus  Allergies  Allergen Reactions  . Remicade [Infliximab]     Dr Karilyn Cota states previous respiratory arrest not related to remicade. Dr Karilyn Cota states remicade not a drug related allergy. 07-07-2013 at 1025 rapid response called to PACU- Patient difficulty breathing after infusion of Remicade infusing. Do NOT Give Remicade!  . Fentanyl And Related Itching    Swelling, Redness  . Alfentanil Itching    Swelling, Redness  . Wellbutrin [Bupropion] Nausea And Vomiting  . Morphine And Related Hives   Patient Measurements: Height: 5\' 11"  (180.3 cm) Weight: 195 lb (88.451 kg) IBW/kg (Calculated) : 75.3  Usual body weight ~ 100kg  Vital Signs: Temp: 98.3 F (36.8 C) (01/14 1533) Temp Source: Oral (01/14 1533) BP: 109/73 mmHg (01/14 1533) Pulse Rate: 89 (01/14 1533) Intake/Output from previous day: 01/13 0701 - 01/14 0700 In: 1718.3 [P.O.:240; IV Piggyback:650; TPN:828.3] Out: 1150 [Urine:1150] Intake/Output from this shift: Total I/O In: 0  Out: 950 [Urine:950]  Labs:  Recent Labs  12/27/14 0538  WBC 5.3  HGB 8.3*  HCT 27.9*  PLT 103*     Recent Labs  12/25/14 0607 12/26/14 0638 12/27/14 0538  NA 134* 134* 137  K 3.3* 2.9* 3.5  CL 96 96 98  CO2 33* 33* 34*  GLUCOSE 112* 105* 121*  BUN 13 11 12   CREATININE 0.55 0.54 0.66  CALCIUM 7.3* 7.4* 7.6*  MG  --   --  1.5  PHOS  --   --  2.9  PROT  --   --  5.2*  ALBUMIN  --   --  2.8*  AST  --   --  27  ALT  --   --  34  ALKPHOS  --   --  195*  BILITOT  --   --  0.7   Estimated Creatinine Clearance: 133.3 mL/min (by C-G formula based on Cr of 0.66).    Recent Labs  12/27/14 0555 12/27/14 0843 12/27/14 1321  GLUCAP 134* 134* 134*   Medical History: Past Medical History  Diagnosis Date  . 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  Hepatomegaly 05/04/2011  . Fatty liver 05/04/2011    "stage III fatty liver fibrosis" (11/15/2012)  . GERD (gastroesophageal reflux disease)   . Chronic liver disease     /notes 11/15/2012  . Pericarditis     [name] 11/15/2012  . Hypertension   . Pneumonia 1977  . Shortness of breath     "all the time right now" (11/15/2012)  . History of blood transfusion 2004  . History of stomach ulcers   . Duodenal ulcer   . Depression   . Kidney stones     bilaterally/notes 11/15/2012  . Hepatic fibrosis     [name] 11/15/2012  . Anemia   . Pericardial effusion 10/29/2012    moderate to large/notes 11/15/2012  . Anxiety   . Hepatitis   . Crohn's disease   . ED (erectile dysfunction)   . 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 Vs NASH [Meld score 9], dysplastic hepatic nodule, EGD 11/14=grd  1 varices recent attempt at ex-lap 11/23 [not successful 2/2 to frozen abd] admitted with generalized abd pain, n,v. States that he just been seen at [facility] 12/05/14 to change out his PEG tube to a larger size but was not done due to low BP.  Was sent home to be with family over holidays. Over that period of time he progressively got weaker and weaker and felt more ill or nauseous and was unable to keep anything down.  He was hospitalized at [facility] 12/27-31 and discharged with TPN but was readmitted due to choking and possible aspiration after eating at home.   Evaluation at [facility] for liver/colon transplant is pending.  Plan transfer to [facility] when bed available.  Na/K+ improved and within goal range today. On Lasix & oral potassium replacement. CBGs mildly increased with addition of Solu-Cortef and correction insulin given as needed. 24hr I/O -81.72ml. Will  continue at decreased rate as per discussion with Dr Karilyn Cota to limit fluid intake/weight gain.  Unable to concentrate TPN due to limitations with premixed Clinimix.  Estimated Nutritional Needs: Kcal: 2100-2300 Protein: 96-106 grams Fluid: 2.1-2.3 L  Plan:   Continue TPN with Clinimix E 5/15 at 34ml/hr   Provide lipids, trace elements, and MVI with TPN  Continue sliding scale insulin with CBG checks q8hrs  Monitor lytes, triglycerides, glucose tolerance, renal fxn, and fluid status  F/U fluid status with Dr Karilyn Cota prior to any rate increases.  Elson Clan 12/27/2014,4:02 PM

## 2014-12-27 NOTE — Progress Notes (Signed)
[facility] PROGRESS NOTE  David [name] WJX:[id number] DOB: 12-Aug-1976 DOA: 12/15/2014 PCP: [name], MD  Assessment/Plan: 1. Cellulitis around gastrostomy tube. Wound culture shows rare Pseudomonas. Unclear if this is a true pathogen. He has been afebrile with a normal WBC count. Continue oral Bactrim for now. Follow-up sensitivities on culture.. 2. Aspiration pneumonia. Admitted with shortness of breath and chest x-ray findings of aspiration. No hypoxia or respiratory symptoms. Appears to have resolved with treatment. He is continued as nothing by mouth. 3. Nutrition. Currently on TPN. Gastrostomy tube is in place for decompression. 4. Cirrhosis. Felt to be related to methotrexate use in the past.  5. Anemia of chronic disease with iron deficiency. No evidence of bleeding. Continue to monitor. 6. Anasarca. Contributory factor may be hypoalbuminemia related to cirrhosis. Discussed with GI and patient started on albumin infusions. Continue lasix at 40 mg 3 times a day. Strict monitoring of intake and output. Aldactone is also been added to his regimen. 7. End-stage Crohn's disease. Appreciate GI assistance. 8. Chronic steroid therapy for Crohn's disease. Currently on intravenous hydrocortisone. 9. Gastroparesis. Stable. Continue Reglan, PPI, Carafate.   Code Status: full code Family Communication: discussed with patient and wife Disposition Plan: Dr. Karilyn Cota discussed case with Dr. Jilda Roche at [facility] who has accepted the patient in transfer. Patient will be transferred there once a bed is available.   Consultants:  Gastroenterology  Procedures:    Antibiotics:  Unasyn 1/8 >> 1/13  Vancomycin 1/8 >> 1/13  Zosyn 1/2 >> 1/5  Augmentin 1/5 >> 1/8  Bactrim 1/13>>  HPI/Subjective: Feels that lower extremity edema may have slightly improved. No nausea or vomiting. Abdominal pain a little better today.  Objective: Filed Vitals:   12/27/14 1533  BP: 109/73   Pulse: 89  Temp: 98.3 F (36.8 C)  Resp: 18    Intake/Output Summary (Last 24 hours) at 12/27/14 1905 Last data filed at 12/27/14 1749  Gross per 24 hour  Intake    940 ml  Output   1700 ml  Net   -760 ml   Filed Weights   12/26/14 0548 12/26/14 1715 12/27/14 0500  Weight: 87.3 kg (192 lb 7.4 oz) 85.503 kg (188 lb 8 oz) 88.451 kg (195 lb)    Exam:   General:  NAD  Cardiovascular: S1, s2 RRR  Respiratory: CTA B  Abdomen: distended, diffusely tender, bs+  Musculoskeletal: 2-3+ edema b/l in LE   Data Reviewed: Basic Metabolic Panel:  Recent Labs Lab 12/23/14 0612 12/24/14 0649 12/25/14 0607 12/26/14 0638 12/27/14 0538  NA 130* 132* 134* 134* 137  K 3.0* 3.0* 3.3* 2.9* 3.5  CL 93* 95* 96 96 98  CO2 33* 31 33* 33* 34*  GLUCOSE 132* 165* 112* 105* 121*  BUN CREATININE 0.50 0.49* 0.55 0.54 0.66  CALCIUM 7.0* 7.3* 7.3* 7.4* 7.6*  MG 1.6 1.5  --   --  1.5  PHOS  --  2.6  --   --  2.9   Liver Function Tests:  Recent Labs Lab 12/24/14 0649 12/27/14 0538  AST 22 27  ALT 32 34  ALKPHOS 139* 195*  BILITOT 1.0 0.7  PROT 4.5* 5.2*  ALBUMIN 2.2* 2.8*    Recent Labs Lab 12/25/14 0607  LIPASE 16   No results for input(s): AMMONIA in the last 168 hours. CBC:  Recent Labs Lab 12/22/14 0601 12/24/14 0649 12/27/14 0538  WBC 5.0 5.1 5.3  NEUTROABS 3.6 4.1  --  HGB 8.5* 8.5* 8.3*  HCT 27.1* 27.2* 27.9*  MCV 86.3 85.3 86.9  PLT 70* 69* 103*   Cardiac Enzymes: No results for input(s): CKTOTAL, CKMB, CKMBINDEX, TROPONINI in the last 168 hours. BNP (last 3 results) No results for input(s): PROBNP in the last 8760 hours. CBG:  Recent Labs Lab 12/26/14 1930 12/27/14 0003 12/27/14 0555 12/27/14 0843 12/27/14 1321  GLUCAP 103* 195* 134* 134* 134*    Recent Results (from the past 240 hour(s))  Wound culture     Status: None   Collection Time: 12/21/14  6:44 AM  Result Value Ref Range Status   Specimen Description G/T SITE   Final   Special Requests Normal  Final   Gram Stain   Final    FEW WBC PRESENT,BOTH PMN AND MONONUCLEAR NO SQUAMOUS EPITHELIAL CELLS SEEN NO ORGANISMS SEEN Performed at [facility]    Culture   Final    MULTIPLE ORGANISMS PRESENT, NONE PREDOMINANT Note: NO STAPHYLOCOCCUS AUREUS ISOLATED NO GROUP A STREP (S.PYOGENES) ISOLATED Performed at [facility]    Report Status 12/24/2014 FINAL  Final  Culture, Urine     Status: None   Collection Time: 12/21/14  7:35 AM  Result Value Ref Range Status   Specimen Description URINE, CLEAN CATCH  Final   Special Requests Normal  Final   Colony Count   Final    2,000 COLONIES/ML Performed at [facility]    Culture   Final    INSIGNIFICANT GROWTH Performed at [facility]    Report Status 12/22/2014 FINAL  Final  Culture, blood (routine x 2)     Status: None   Collection Time: 12/21/14  8:26 AM  Result Value Ref Range Status   Specimen Description BLOOD LEFT ANTECUBITAL  Final   Special Requests BOTTLES DRAWN AEROBIC AND ANAEROBIC 10CC BOTTLES  Final   Culture NO GROWTH 5 DAYS  Final   Report Status 12/26/2014 FINAL  Final  Culture, blood (routine x 2)     Status: None   Collection Time: 12/21/14  8:31 AM  Result Value Ref Range Status   Specimen Description BLOOD LEFT HAND  Final   Special Requests BOTTLES DRAWN AEROBIC ONLY 6CC BOTTLE  Final   Culture NO GROWTH 5 DAYS  Final   Report Status 12/26/2014 FINAL  Final  Clostridium Difficile by PCR     Status: None   Collection Time: 12/21/14  1:56 PM  Result Value Ref Range Status   C difficile by pcr NEGATIVE NEGATIVE Final  Wound culture     Status: None (Preliminary result)   Collection Time: 12/24/14  2:30 PM  Result Value Ref Range Status   Specimen Description WOUND GASTROSTOMY TUBE SITE  Final   Special Requests NONE  Final   Gram Stain   Final    RARE WBC PRESENT, PREDOMINANTLY MONONUCLEAR NO SQUAMOUS EPITHELIAL CELLS SEEN NO ORGANISMS  SEEN Performed at [facility]    Culture   Final    RARE PSEUDOMONAS AERUGINOSA Performed at [facility]    Report Status PENDING  Incomplete  Clostridium Difficile by PCR     Status: None   Collection Time: 12/26/14 10:16 PM  Result Value Ref Range Status   C difficile by pcr NEGATIVE NEGATIVE Final     Studies: Dg Abd Acute W/chest  12/26/2014   CLINICAL DATA:  Small bowel obstruction. Right side abdominal pain. Former smoker, pneumonia.  EXAM: ACUTE ABDOMEN SERIES (ABDOMEN 2 VIEW &  CHEST 1 VIEW)  COMPARISON:  CT 12/23/2014.  FINDINGS: Air-fluid levels are noted in the mid abdomen. Mildly dilated mid abdominal small bowel loops on the supine view. Findings compatible with small bowel obstruction. Relative paucity of gas throughout the colon. No free air. Gastrostomy tube projects over the distal stomach. Prior cholecystectomy. No organomegaly or suspicious calcification.  Heart is normal size. Patchy airspace disease in the right lung base. Left lung is clear. No effusions. No acute bony abnormality. Right PICC line is in place with the tip in the lower SVC, stable.  IMPRESSION: Small bowel obstruction pattern, similar to prior CT.  Right basilar opacity.  Cannot exclude pneumonia.   Electronically Signed   By: Charlett Nose M.D.   On: 12/26/2014 11:52    Scheduled Meds: . acidophilus  1 capsule Oral Daily  . albumin human  25 g Intravenous BID  . furosemide  40 mg Intravenous TID  . hydrocortisone sod succinate (SOLU-CORTEF) inj  50 mg Intravenous Daily  . insulin aspart  0-20 Units Subcutaneous Q8H  . metoCLOPramide (REGLAN) injection  5 mg Intravenous 3 times per day  . nystatin-triamcinolone   Topical BID  . OxyCODONE  15 mg Oral Q12H  . oxyCODONE  20 mg Oral QID  . pantoprazole (PROTONIX) IV  40 mg Intravenous BID  . potassium chloride  40 mEq Oral TID  . sodium chloride  10 mL Intravenous Q12H  . sodium chloride  3 mL Intravenous Q12H  . spironolactone  25 mg  Oral BID  . sulfamethoxazole-trimethoprim  1 tablet Oral Q12H  . zolpidem  10 mg Oral QHS   Continuous Infusions: . Marland KitchenTPN (CLINIMIX-E) Adult 60 mL/hr at 12/27/14 1728   And  . fat emulsion 240 mL (12/27/14 1728)    Principal Problem:   Aspiration pneumonia Active Problems:   Crohn's disease of both small and large intestine with fistula   Chronic right lower quadrant pain   Hypertension   Liver cirrhosis   Gastroparesis   Aspiration into airway   Anasarca   Anemia of chronic disease   Abdominal pain, generalized   Infection    Time spent:    Hanna Aultman  [facility] Pager [phone]. If 7PM-7AM, please contact night-coverage at [email or web address], password [facility] 12/27/2014, 7:05 PM  LOS: 12 days

## 2014-12-28 LAB — WOUND CULTURE

## 2014-12-28 LAB — GLUCOSE, CAPILLARY
Glucose-Capillary: 116 mg/dL — ABNORMAL HIGH (ref 70–99)
Glucose-Capillary: 100 mg/dL — ABNORMAL HIGH (ref 70–99)

## 2014-12-28 MED ORDER — INSULIN ASPART 100 UNIT/ML ~~LOC~~ SOLN
0.0000 [IU] | Freq: Three times a day (TID) | SUBCUTANEOUS | Status: DC
  Administered 2014-12-30: 3 [IU] via SUBCUTANEOUS

## 2014-12-28 MED ORDER — FAT EMULSION 20 % IV EMUL
240.0000 mL | INTRAVENOUS | Status: AC
  Administered 2014-12-28: 240 mL via INTRAVENOUS
  Filled 2014-12-28: qty 250

## 2014-12-28 MED ORDER — INSULIN ASPART 100 UNIT/ML ~~LOC~~ SOLN: 0.0000 [IU] | Freq: Three times a day (TID) | SUBCUTANEOUS | Status: DC

## 2014-12-28 MED ORDER — HYDROMORPHONE HCL 1 MG/ML IJ SOLN
3.0000 mg | INTRAMUSCULAR | Status: DC | PRN
  Administered 2014-12-28 – 2014-12-29 (×7): 3 mg via INTRAVENOUS
  Filled 2014-12-28 (×7): qty 3

## 2014-12-28 MED ORDER — TRACE MINERALS CR-CU-F-FE-I-MN-MO-SE-ZN IV SOLN
INTRAVENOUS | Status: AC
  Administered 2014-12-28: 17:00:00 via INTRAVENOUS
  Filled 2014-12-28: qty 1440

## 2014-12-28 MED ORDER — OXYCODONE HCL ER 15 MG PO T12A
30.0000 mg | EXTENDED_RELEASE_TABLET | Freq: Two times a day (BID) | ORAL | Status: DC
  Administered 2014-12-28 – 2014-12-30 (×4): 30 mg via ORAL
  Filled 2014-12-28 (×4): qty 2

## 2014-12-28 NOTE — Progress Notes (Signed)
  Subjective:  Patient continues to complain of abdominal pain. He feels his pain control is better than it has been. He had 2 bowel movements earlier today. Stool consistency is loose and mushy. He denies melena or rectal bleeding. He is hungry. He feels lower extremity edema is unchanged. He states scrotal edema is back.   Objective: Blood pressure 102/70, pulse 81, temperature 98 F (36.7 C), temperature source Oral, resp. rate 16, height 5\' 11"  (1.803 m), weight 190 lb (86.183 kg), SpO2 98 %. Patient is alert and in no acute distress. He is busy with his laptop. Abdomen is full. Gastrostomy tube is in place. Scant amount of mucopurulent discharge noted around gastrostomy tube site. Bowel sounds are hyperactive. He remains with moderate tenderness in epigastric and mid abdomen. Fullness noted in right upper and lower quadrant. He has pitting edema in both flanks and lower extremities. Right thigh is larger than the left but no tenderness noted.  Urine output 3 L last 24 hours  Labs/studies Results:   Recent Labs  12/27/14 0538  WBC 5.3  HGB 8.3*  HCT 27.9*  PLT 103*    BMET   Recent Labs  12/26/14 0638 12/27/14 0538  NA 134* 137  K 2.9* 3.5  CL 96 98  CO2 33* 34*  GLUCOSE 105* 121*  BUN 11 12  CREATININE 0.54 0.66  CALCIUM 7.4* 7.6*    LFT   Recent Labs  12/27/14 0538  PROT 5.2*  ALBUMIN 2.8*  AST 27  ALT 34  ALKPHOS 195*  BILITOT 0.7      Assessment:  #1. Fluid overload. He has only lost 2 pounds in the last 24 hours. Excellent response to IV diuretic. He has at least 30 pounds of third space fluid that he needs to get rid off. He received total of 100 g of IV albumin over the last 2 days which should help. #2. Small bowel Crohn's disease with ileal strictures. He does not appear to have complete small bowel obstruction and therefore will try him on full liquids #3. Acute on chronic abdominal pain secondary to Crohn's disease and tissue edema. #4.  Cirrhosis. #5. Anemia secondary to chronic disease and iron deficiency. H&H is low but stable and no evidence of bleeding. #6. Osteoporosis.  Please note hospitalist from [facility] called earlier today and declined to accept patient in transfer and recommended outpatient evaluation for transplant.  Recommendations;  Unclamp gastrostomy tube on as-needed basis. Full liquid diet. Will arrange for outpatient visit with Dr. Marval Regal when patient goes home.

## 2014-12-28 NOTE — Progress Notes (Signed)
Dr. Jilda Roche of [facility] called and diffuse to accept patient in transfer. However she had not even talk with Dr. Marval Regal. She stated he was in the water during liver transplant. This means patient will have to be evaluated on an outpatient basis.

## 2014-12-28 NOTE — Progress Notes (Signed)
PARENTERAL NUTRITION CONSULT NOTE  Pharmacy Consult for TPN Indication: prolonged ileus  Allergies  Allergen Reactions  . Remicade [Infliximab]     Dr Karilyn Cota states previous respiratory arrest not related to remicade. Dr Karilyn Cota states remicade not a drug related allergy. 07-07-2013 at 1025 rapid response called to PACU- Patient difficulty breathing after infusion of Remicade infusing. Do NOT Give Remicade!  . Fentanyl And Related Itching    Swelling, Redness  . Alfentanil Itching    Swelling, Redness  . Wellbutrin [Bupropion] Nausea And Vomiting  . Morphine And Related Hives   Patient Measurements: Height: 5\' 11"  (180.3 cm) Weight: 190 lb (86.183 kg) IBW/kg (Calculated) : 75.3  Usual body weight ~ 100kg  Vital Signs: Temp: 97.8 F (36.6 C) (01/15 0632) Temp Source: Oral (01/15 2641) BP: 107/71 mmHg (01/15 5830) Pulse Rate: 72 (01/15 0632) Intake/Output from previous day: 01/14 0701 - 01/15 0700 In: 940 [IV Piggyback:100; TPN:840] Out: 3000 [Urine:3000] Intake/Output from this shift: Total I/O In: 13 [I.V.:13] Out: 650 [Urine:650]  Labs:  Recent Labs  12/27/14 0538  WBC 5.3  HGB 8.3*  HCT 27.9*  PLT 103*     Recent Labs  12/26/14 0638 12/27/14 0538  NA 134* 137  K 2.9* 3.5  CL 96 98  CO2 33* 34*  GLUCOSE 105* 121*  BUN 11 12  CREATININE 0.54 0.66  CALCIUM 7.4* 7.6*  MG  --  1.5  PHOS  --  2.9  PROT  --  5.2*  ALBUMIN  --  2.8*  AST  --  27  ALT  --  34  ALKPHOS  --  195*  BILITOT  --  0.7   Estimated Creatinine Clearance: 133.3 mL/min (by C-G formula based on Cr of 0.66).    Recent Labs  12/27/14 1321 12/27/14 2147 12/28/14 0630  GLUCAP 134* 156* 116*   Medical History: Past Medical History  Diagnosis Date  . 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 Hepatomegaly 05/04/2011  . Fatty liver 05/04/2011    "stage III fatty liver fibrosis" (11/15/2012)  .  GERD (gastroesophageal reflux disease)   . Chronic liver disease     /notes 11/15/2012  . Pericarditis     [name] 11/15/2012  . Hypertension   . Pneumonia 1977  . Shortness of breath     "all the time right now" (11/15/2012)  . History of blood transfusion 2004  . History of stomach ulcers   . Duodenal ulcer   . Depression   . Kidney stones     bilaterally/notes 11/15/2012  . Hepatic fibrosis     [name] 11/15/2012  . Anemia   . Pericardial effusion 10/29/2012    moderate to large/notes 11/15/2012  . Anxiety   . Hepatitis   . Crohn's disease   . ED (erectile dysfunction)   . 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 States that he just been seen at [facility] 12/05/14 to change out his PEG tube to  a larger size but was not done due to low BP.  Was sent home to be with family over holidays. Over that period of time he progressively got weaker and weaker and felt more ill or nauseous and was unable to keep anything down.  He was hospitalized at [facility] 12/27-31 and discharged with TPN but was readmitted due to choking and possible aspiration after eating at home.   Evaluation at [facility] for liver/colon transplant is pending.  Plan transfer to [facility] when bed available.  Na/K+ improved and within goal range today. On Lasix & oral potassium replacement. CBGs mildly increased with addition of Solu-Cortef and correction insulin given as needed. 24hr I/O - . Will continue at decreased rate as per discussion with Dr Karilyn Cota to limit fluid intake/weight gain.  Unable to  concentrate TPN due to limitations with premixed Clinimix.  Estimated Nutritional Needs: Kcal: 2100-2300 Protein: 96-106 grams Fluid: 2.1-2.3 L  Plan:   Continue TPN with Clinimix E 5/15 at 30ml/hr   Provide lipids, trace elements, and MVI with TPN  Continue sliding scale insulin with CBG checks q8hrs  Monitor lytes, triglycerides, glucose tolerance, renal fxn, and fluid status  F/U fluid status with Dr Karilyn Cota prior to any rate increases.  Margo Aye, Owain Eckerman A 12/28/2014,11:15 AM

## 2014-12-28 NOTE — Progress Notes (Signed)
[facility] PROGRESS NOTE  David [name] ZOX:[id number] DOB: 09-23-1976 DOA: 12/15/2014 PCP: [name], MD  Assessment/Plan: 1. Cellulitis around gastrostomy tube. Wound culture shows rare Pseudomonas. Unclear if this is a true pathogen. He has been afebrile with a normal WBC count. Continue oral Bactrim for now. 2. Aspiration pneumonia. Admitted with shortness of breath and chest x-ray findings of aspiration. No hypoxia or respiratory symptoms. Appears to have resolved with treatment. Per GI, started on full liquids 3. Nutrition. Currently on TPN. Gastrostomy tube is in place for decompression. 4. Cirrhosis. Felt to be related to methotrexate use in the past.  5. Anemia of chronic disease with iron deficiency. No evidence of bleeding. Continue to monitor. 6. Anasarca. Contributory factor may be hypoalbuminemia related to cirrhosis. Patient received albumin infusion. Urine output over last 24 hours has improved. Strict monitoring of intake and output. Aldactone is also been added to his regimen. May need to consider metolazone for further diuresis. 7. End-stage Crohn's disease. Appreciate GI assistance. 8. Chronic steroid therapy for Crohn's disease. Currently on intravenous hydrocortisone. 9. Gastroparesis. Stable. Continue Reglan, PPI, Carafate.   Code Status: full code Family Communication: discussed with patient and wife Disposition Plan: [facility] hospitalist is no longer accepting patient in transfer and feels that transplant evaluation can be done as an outpatient. Plan will be to discharge home once improved   Consultants:  Gastroenterology  Procedures:    Antibiotics:  Unasyn 1/8 >> 1/13  Vancomycin 1/8 >> 1/13  Zosyn 1/2 >> 1/5  Augmentin 1/5 >> 1/8  Bactrim 1/13>>  HPI/Subjective: Does not feel that LE edema is improving. Pain is better controlled now  Objective: Filed Vitals:   12/28/14 1530  BP: 102/70  Pulse: 81  Temp: 98 F (36.7 C)   Resp: 16    Intake/Output Summary (Last 24 hours) at 12/28/14 1921 Last data filed at 12/28/14 1858  Gross per 24 hour  Intake    807 ml  Output   3450 ml  Net  -2643 ml   Filed Weights   12/26/14 1715 12/27/14 0500 12/28/14 8119  Weight: 85.503 kg (188 lb 8 oz) 88.451 kg (195 lb) 86.183 kg (190 lb)    Exam:   General:  NAD  Cardiovascular: S1, s2 RRR  Respiratory: CTA B  Abdomen: distended, diffusely tender, bs+  Musculoskeletal: 2-3+ edema b/l in LE   Data Reviewed: Basic Metabolic Panel:  Recent Labs Lab 12/23/14 0612 12/24/14 0649 12/25/14 0607 12/26/14 0638 12/27/14 0538  NA 130* 132* 134* 134* 137  K 3.0* 3.0* 3.3* 2.9* 3.5  CL 93* 95* 96 96 98  CO2 33* 31 33* 33* 34*  GLUCOSE 132* 165* 112* 105* 121*  BUN CREATININE 0.50 0.49* 0.55 0.54 0.66  CALCIUM 7.0* 7.3* 7.3* 7.4* 7.6*  MG 1.6 1.5  --   --  1.5  PHOS  --  2.6  --   --  2.9   Liver Function Tests:  Recent Labs Lab 12/24/14 0649 12/27/14 0538  AST 22 27  ALT 32 34  ALKPHOS 139* 195*  BILITOT 1.0 0.7  PROT 4.5* 5.2*  ALBUMIN 2.2* 2.8*    Recent Labs Lab 12/25/14 0607  LIPASE 16   No results for input(s): AMMONIA in the last 168 hours. CBC:  Recent Labs Lab 12/22/14 0601 12/24/14 0649 12/27/14 0538  WBC 5.0 5.1 5.3  NEUTROABS 3.6 4.1  --   HGB 8.5* 8.5* 8.3*  HCT 27.1* 27.2* 27.9*  MCV 86.3 85.3 86.9  PLT 70* 69* 103*   Cardiac Enzymes: No results for input(s): CKTOTAL, CKMB, CKMBINDEX, TROPONINI in the last 168 hours. BNP (last 3 results) No results for input(s): PROBNP in the last 8760 hours. CBG:  Recent Labs Lab 12/27/14 0843 12/27/14 1321 12/27/14 2147 12/28/14 0630 12/28/14 1648  GLUCAP 134* 134* 156* 116* 100*    Recent Results (from the past 240 hour(s))  Wound culture     Status: None   Collection Time: 12/21/14  6:44 AM  Result Value Ref Range Status   Specimen Description G/T SITE  Final   Special Requests Normal  Final    Gram Stain   Final    FEW WBC PRESENT,BOTH PMN AND MONONUCLEAR NO SQUAMOUS EPITHELIAL CELLS SEEN NO ORGANISMS SEEN Performed at [facility]    Culture   Final    MULTIPLE ORGANISMS PRESENT, NONE PREDOMINANT Note: NO STAPHYLOCOCCUS AUREUS ISOLATED NO GROUP A STREP (S.PYOGENES) ISOLATED Performed at [facility]    Report Status 12/24/2014 FINAL  Final  Culture, Urine     Status: None   Collection Time: 12/21/14  7:35 AM  Result Value Ref Range Status   Specimen Description URINE, CLEAN CATCH  Final   Special Requests Normal  Final   Colony Count   Final    2,000 COLONIES/ML Performed at [facility]    Culture   Final    INSIGNIFICANT GROWTH Performed at [facility]    Report Status 12/22/2014 FINAL  Final  Culture, blood (routine x 2)     Status: None   Collection Time: 12/21/14  8:26 AM  Result Value Ref Range Status   Specimen Description BLOOD LEFT ANTECUBITAL  Final   Special Requests BOTTLES DRAWN AEROBIC AND ANAEROBIC 10CC BOTTLES  Final   Culture NO GROWTH 5 DAYS  Final   Report Status 12/26/2014 FINAL  Final  Culture, blood (routine x 2)     Status: None   Collection Time: 12/21/14  8:31 AM  Result Value Ref Range Status   Specimen Description BLOOD LEFT HAND  Final   Special Requests BOTTLES DRAWN AEROBIC ONLY 6CC BOTTLE  Final   Culture NO GROWTH 5 DAYS  Final   Report Status 12/26/2014 FINAL  Final  Clostridium Difficile by PCR     Status: None   Collection Time: 12/21/14  1:56 PM  Result Value Ref Range Status   C difficile by pcr NEGATIVE NEGATIVE Final  Wound culture     Status: None   Collection Time: 12/24/14  2:30 PM  Result Value Ref Range Status   Specimen Description WOUND GASTROSTOMY TUBE SITE  Final   Special Requests NONE  Final   Gram Stain   Final    RARE WBC PRESENT, PREDOMINANTLY MONONUCLEAR NO SQUAMOUS EPITHELIAL CELLS SEEN NO ORGANISMS SEEN Performed at [facility]    Culture   Final     RARE PSEUDOMONAS AERUGINOSA Performed at [facility]    Report Status 12/28/2014 FINAL  Final   Organism ID, Bacteria PSEUDOMONAS AERUGINOSA  Final      Susceptibility   Pseudomonas aeruginosa - MIC*    CEFEPIME 8 SENSITIVE Sensitive     CEFTAZIDIME 4 SENSITIVE Sensitive     CIPROFLOXACIN <=0.25 SENSITIVE Sensitive     GENTAMICIN 8 INTERMEDIATE Intermediate     IMIPENEM 1 SENSITIVE Sensitive     PIP/TAZO 8 SENSITIVE Sensitive     TOBRAMYCIN <=1 SENSITIVE Sensitive     *  RARE PSEUDOMONAS AERUGINOSA  Clostridium Difficile by PCR     Status: None   Collection Time: 12/26/14 10:16 PM  Result Value Ref Range Status   C difficile by pcr NEGATIVE NEGATIVE Final     Studies: No results found.  Scheduled Meds: . acidophilus  1 capsule Oral Daily  . furosemide  40 mg Intravenous TID  . hydrocortisone sod succinate (SOLU-CORTEF) inj  50 mg Intravenous Daily  . insulin aspart  0-20 Units Subcutaneous Q8H  . metoCLOPramide (REGLAN) injection  5 mg Intravenous 3 times per day  . nystatin-triamcinolone   Topical BID  . OxyCODONE  15 mg Oral Q12H  . oxyCODONE  20 mg Oral QID  . pantoprazole (PROTONIX) IV  40 mg Intravenous BID  . potassium chloride  40 mEq Oral TID  . sodium chloride  10 mL Intravenous Q12H  . sodium chloride  3 mL Intravenous Q12H  . spironolactone  25 mg Oral BID  . sulfamethoxazole-trimethoprim  1 tablet Oral Q12H  . zolpidem  10 mg Oral QHS   Continuous Infusions: . Marland KitchenTPN (CLINIMIX-E) Adult 60 mL/hr at 12/28/14 1728   And  . 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 If 7PM-7AM, please contact night-coverage at [email or web address], password [facility] 12/28/2014, 7:21 PM  LOS: 13 days

## 2014-12-29 LAB — BASIC METABOLIC PANEL
Anion gap: 4 — ABNORMAL LOW (ref 5–15)
BUN: 13 mg/dL (ref 6–23)
CO2: 32 mmol/L (ref 19–32)
Calcium: 8.1 mg/dL — ABNORMAL LOW (ref 8.4–10.5)
Chloride: 98 mEq/L (ref 96–112)
Creatinine, Ser: 0.73 mg/dL (ref 0.50–1.35)
GFR calc non Af Amer: >90 mL/min (ref >90)
Glucose, Bld: 101 mg/dL — ABNORMAL HIGH (ref 70–99)
Potassium: 4.4 mmol/L (ref 3.5–5.1)
Sodium: 134 mmol/L — ABNORMAL LOW (ref 135–145)

## 2014-12-29 LAB — GLUCOSE, CAPILLARY
Glucose-Capillary: 130 mg/dL — ABNORMAL HIGH (ref 70–99)
Glucose-Capillary: 90 mg/dL (ref 70–99)
Glucose-Capillary: 92 mg/dL (ref 70–99)

## 2014-12-29 MED ORDER — TRACE MINERALS CR-CU-F-FE-I-MN-MO-SE-ZN IV SOLN
INTRAVENOUS | Status: AC
  Administered 2014-12-29: 17:00:00 via INTRAVENOUS
  Filled 2014-12-29: qty 1440

## 2014-12-29 MED ORDER — PAMIDRONATE DISODIUM 30 MG IV SOLR
INTRAVENOUS | Status: AC
  Filled 2014-12-29: qty 60

## 2014-12-29 MED ORDER — METOLAZONE 5 MG PO TABS
5.0000 mg | ORAL_TABLET | Freq: Once | ORAL | Status: AC
  Administered 2014-12-29: 5 mg via ORAL
  Filled 2014-12-29: qty 1

## 2014-12-29 MED ORDER — HYDROMORPHONE HCL 2 MG/ML IJ SOLN
4.0000 mg | INTRAMUSCULAR | Status: DC | PRN
  Administered 2014-12-29 – 2014-12-30 (×7): 4 mg via INTRAVENOUS
  Filled 2014-12-29 (×7): qty 2

## 2014-12-29 MED ORDER — SODIUM CHLORIDE 0.9 % IV SOLN
60.0000 mg | Freq: Once | INTRAVENOUS | Status: DC
  Filled 2014-12-29: qty 20

## 2014-12-29 MED ORDER — SODIUM CHLORIDE 0.9 % IV SOLN
60.0000 mg | Freq: Once | INTRAVENOUS | Status: AC
  Administered 2014-12-29: 60 mg via INTRAVENOUS
  Filled 2014-12-29: qty 20

## 2014-12-29 NOTE — Progress Notes (Signed)
[facility] PROGRESS NOTE  KYLE LUPPINO WJX:[id number] DOB: 06-05-1976 DOA: 12/15/2014 PCP: [name], MD  Assessment/Plan: 1. Cellulitis around gastrostomy tube. Wound culture shows rare Pseudomonas. Unclear if this is a true pathogen. He has been afebrile with a normal WBC count. Continue oral Bactrim for now. 2. Aspiration pneumonia. Admitted with shortness of breath and chest x-ray findings of aspiration. No hypoxia or respiratory symptoms. Appears to have resolved with treatment. Per GI, on full liquids 3. Nutrition. Currently on TPN. Gastrostomy tube is in place for decompression. 4. Cirrhosis. Felt to be related to methotrexate use in the past.  5. Anemia of chronic disease with iron deficiency. No evidence of bleeding. Continue to monitor. 6. Anasarca. Contributory factor may be hypoalbuminemia related to cirrhosis. Patient received albumin infusion. Urine output over last 24 hours has improved. Strict monitoring of intake and output. Aldactone is also been added to his regimen. Patient received a dose of metolazone today 7. End-stage Crohn's disease. Appreciate GI assistance. 8. Chronic steroid therapy for Crohn's disease. Currently on intravenous hydrocortisone. We'll transition to oral prednisone on discharge 9. Gastroparesis. Stable. Continue Reglan, PPI, Carafate.   Code Status: full code Family Communication: discussed with patient and wife Disposition Plan: [facility] hospitalist is no longer accepting patient in transfer and feels that transplant evaluation can be done as an outpatient. Plan will be to discharge home once improved   Consultants:  Gastroenterology  Procedures:    Antibiotics:  Unasyn 1/8 >> 1/13  Vancomycin 1/8 >> 1/13  Zosyn 1/2 >> 1/5  Augmentin 1/5 >> 1/8  Bactrim 1/13>>  HPI/Subjective: Feels the pain is worse today. Describes it as tightness and abdomen as well as his legs. Feels that his edema is contributing to this.  Tolerating full liquids without any vomiting.  Objective: Filed Vitals:   12/29/14 1410  BP: 112/65  Pulse: 89  Temp: 98.4 F (36.9 C)  Resp: 16    Intake/Output Summary (Last 24 hours) at 12/29/14 1614 Last data filed at 12/29/14 0800  Gross per 24 hour  Intake   1874 ml  Output   2375 ml  Net   -501 ml   Filed Weights   12/28/14 2130 12/29/14 0550 12/29/14 0940  Weight: 86.183 kg (190 lb) 85.8 kg (189 lb 2.5 oz) 83.87 kg (184 lb 14.4 oz)    Exam:   General:  NAD  Cardiovascular: S1, s2 RRR  Respiratory: CTA B  Abdomen: distended, diffusely tender, bs+  Musculoskeletal: 2-3+ edema b/l in LE   Data Reviewed: Basic Metabolic Panel:  Recent Labs Lab 12/23/14 0612 12/24/14 0649 12/25/14 0607 12/26/14 0638 12/27/14 0538 12/29/14 0652  NA 130* 132* 134* 134* 137 134*  K 3.0* 3.0* 3.3* 2.9* 3.5 4.4  CL 93* 95* 96 96 98 98  CO2 33* 31 33* 33* 34* 32  GLUCOSE 132* 165* 112* 105* 121* 101*  BUN 15 14 13 11 12 13   CREATININE 0.50 0.49* 0.55 0.54 0.66 0.73  CALCIUM 7.0* 7.3* 7.3* 7.4* 7.6* 8.1*  MG 1.6 1.5  --   --  1.5  --   PHOS  --  2.6  --   --  2.9  --    Liver Function Tests:  Recent Labs Lab 12/24/14 0649 12/27/14 0538  AST 22 27  ALT 32 34  ALKPHOS 139* 195*  BILITOT 1.0 0.7  PROT 4.5* 5.2*  ALBUMIN 2.2* 2.8*    Recent Labs Lab 12/25/14 0607  LIPASE 16   No results  for input(s): AMMONIA in the last 168 hours. CBC:  Recent Labs Lab 12/24/14 0649 12/27/14 0538  WBC 5.1 5.3  NEUTROABS 4.1  --   HGB 8.5* 8.3*  HCT 27.2* 27.9*  MCV 85.3 86.9  PLT 69* 103*   Cardiac Enzymes: No results for input(s): CKTOTAL, CKMB, CKMBINDEX, TROPONINI in the last 168 hours. BNP (last 3 results) No results for input(s): PROBNP in the last 8760 hours. CBG:  Recent Labs Lab 12/28/14 0630 12/28/14 1648 12/29/14 0039 12/29/14 0752 12/29/14 1556  GLUCAP 116* 100* 130* 92 90    Recent Results (from the past 240 hour(s))  Wound culture      Status: None   Collection Time: 12/21/14  6:44 AM  Result Value Ref Range Status   Specimen Description G/T SITE  Final   Special Requests Normal  Final   Gram Stain   Final    FEW WBC PRESENT,BOTH PMN AND MONONUCLEAR NO SQUAMOUS EPITHELIAL CELLS SEEN NO ORGANISMS SEEN Performed at [facility]    Culture   Final    MULTIPLE ORGANISMS PRESENT, NONE PREDOMINANT Note: NO STAPHYLOCOCCUS AUREUS ISOLATED NO GROUP A STREP (S.PYOGENES) ISOLATED Performed at [facility]    Report Status 12/24/2014 FINAL  Final  Culture, Urine     Status: None   Collection Time: 12/21/14  7:35 AM  Result Value Ref Range Status   Specimen Description URINE, CLEAN CATCH  Final   Special Requests Normal  Final   Colony Count   Final    2,000 COLONIES/ML Performed at [facility]    Culture   Final    INSIGNIFICANT GROWTH Performed at [facility]    Report Status 12/22/2014 FINAL  Final  Culture, blood (routine x 2)     Status: None   Collection Time: 12/21/14  8:26 AM  Result Value Ref Range Status   Specimen Description BLOOD LEFT ANTECUBITAL  Final   Special Requests BOTTLES DRAWN AEROBIC AND ANAEROBIC 10CC BOTTLES  Final   Culture NO GROWTH 5 DAYS  Final   Report Status 12/26/2014 FINAL  Final  Culture, blood (routine x 2)     Status: None   Collection Time: 12/21/14  8:31 AM  Result Value Ref Range Status   Specimen Description BLOOD LEFT HAND  Final   Special Requests BOTTLES DRAWN AEROBIC ONLY 6CC BOTTLE  Final   Culture NO GROWTH 5 DAYS  Final   Report Status 12/26/2014 FINAL  Final  Clostridium Difficile by PCR     Status: None   Collection Time: 12/21/14  1:56 PM  Result Value Ref Range Status   C difficile by pcr NEGATIVE NEGATIVE Final  Wound culture     Status: None   Collection Time: 12/24/14  2:30 PM  Result Value Ref Range Status   Specimen Description WOUND GASTROSTOMY TUBE SITE  Final   Special Requests NONE  Final   Gram Stain   Final     RARE WBC PRESENT, PREDOMINANTLY MONONUCLEAR NO SQUAMOUS EPITHELIAL CELLS SEEN NO ORGANISMS SEEN Performed at [facility]    Culture   Final    RARE PSEUDOMONAS AERUGINOSA Performed at [facility]    Report Status 12/28/2014 FINAL  Final   Organism ID, Bacteria PSEUDOMONAS AERUGINOSA  Final      Susceptibility   Pseudomonas aeruginosa - MIC*    CEFEPIME 8 SENSITIVE Sensitive     CEFTAZIDIME 4 SENSITIVE Sensitive     CIPROFLOXACIN <=0.25 SENSITIVE Sensitive  GENTAMICIN 8 INTERMEDIATE Intermediate     IMIPENEM 1 SENSITIVE Sensitive     PIP/TAZO 8 SENSITIVE Sensitive     TOBRAMYCIN <=1 SENSITIVE Sensitive     * RARE PSEUDOMONAS AERUGINOSA  Clostridium Difficile by PCR     Status: None   Collection Time: 12/26/14 10:16 PM  Result Value Ref Range Status   C difficile by pcr NEGATIVE NEGATIVE Final     Studies: No results found.  Scheduled Meds: . acidophilus  1 capsule Oral Daily  . furosemide  40 mg Intravenous TID  . hydrocortisone sod succinate (SOLU-CORTEF) inj  50 mg Intravenous Daily  . insulin aspart  0-20 Units Subcutaneous Q8H  . metoCLOPramide (REGLAN) injection  5 mg Intravenous 3 times per day  . nystatin-triamcinolone   Topical BID  . OxyCODONE  30 mg Oral Q12H  . oxyCODONE  20 mg Oral QID  . pamidronate  60 mg Intravenous Once  . pantoprazole (PROTONIX) IV  40 mg Intravenous BID  . potassium chloride  40 mEq Oral TID  . sodium chloride  10 mL Intravenous Q12H  . sodium chloride  3 mL Intravenous Q12H  . spironolactone  25 mg Oral BID  . sulfamethoxazole-trimethoprim  1 tablet Oral Q12H  . zolpidem  10 mg Oral QHS   Continuous Infusions: . Marland KitchenTPN (CLINIMIX-E) Adult 60 mL/hr at 12/28/14 1728   And  . fat emulsion 240 mL (12/28/14 1736)  . 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 If 7PM-7AM, please contact night-coverage at [email or web address], password [facility] 12/29/2014, 4:14 PM  LOS: 14 days

## 2014-12-29 NOTE — Progress Notes (Signed)
  Subjective:  Patient states he wants to go home. He states his pain is better controlled than it has been. He denies nausea or vomiting. He had no difficulty with full liquids. His bowels are moving and he is passing some flatus. He feels bloated extremity edema may have decreased some.    Objective: Blood pressure 95/59, pulse 76, temperature 98.1 F (36.7 C), temperature source Oral, resp. rate 16, height  (1.803 m), weight 184 lb 14.4 oz (83.87 kg), SpO2 95 %. Patient is alert and is ambulating in room. Abdomen remains full or distended and mid and upper abdomen. Bowel sounds are hyperactive. Scant amount of mucopurulent material noted around gastrostomy tube site. Peristomal erythema has decreased. Abdomen remains with tenderness and fullness in the right half is soft and the left half. 3+ edema involving both legs. Left greater than right.  Urine output 3.2 L last 24 hours  Labs/studies Results:   Recent Labs  12/27/14 0538  WBC 5.3  HGB 8.3*  HCT 27.9*  PLT 103*    BMET   Recent Labs  12/27/14 0538 12/29/14 0652  NA 137 134*  K 3.5 4.4  CL 98 98  CO2 34* 32  GLUCOSE 121* 101*  BUN 12 13  CREATININE 0.66 0.73  CALCIUM 7.6* 8.1*    LFT   Recent Labs  12/27/14 0538  PROT 5.2*  ALBUMIN 2.8*  AST 27  ALT 34  ALKPHOS 195*  BILITOT 0.7     Assessment:  #1. Fluid overload is improving. He has lost 5 pounds in the last 24 hours. Since hypokalemia has resolved will given single dose of Zaroxolyn. #2. Crohn's disease with small bowel strictures. Gastrostomy tube was clamped yesterday. He is tolerating full liquids. Patient is on IV hydrocortisone. He will be switched to prednisone at the time of discharge. #3. 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 It is contribution to third spacing of fluid. Serum albumin is up since he received IV albumin. #4. Malnutrition. Patient is on parenteral nutrition. #5. Anemia secondary to chronic  disease and iron deficiency. #6. Abdominal pain. Control has improved.  #7. Osteoporosis. Discussed with Dr. Kerry Hough. He will receive medication prior to discharge. #8. Hypokalemia has resolved with therapy.  Recommendations;  Metolazone 5 mg by mouth 1. Continue full liquids. We will switch him to prednisone at a dose of 20 mg by mouth daily at the time of discharge. Similarly he will be switched to by mouth diuretic therapy at the time of discharge. We will arrange for outpatient consultation at [facility] for combined small bowel and liver transplant.

## 2014-12-29 NOTE — Progress Notes (Signed)
PARENTERAL NUTRITION CONSULT NOTE  Pharmacy Consult for TPN Indication: prolonged ileus  Allergies  Allergen Reactions  . Remicade [Infliximab]     Dr Karilyn Cota states previous respiratory arrest not related to remicade. Dr Karilyn Cota states remicade not a drug related allergy. 07-07-2013 at 1025 rapid response called to PACU- Patient difficulty breathing after infusion of Remicade infusing. Do NOT Give Remicade!  . Fentanyl And Related Itching    Swelling, Redness  . Alfentanil Itching    Swelling, Redness  . Wellbutrin [Bupropion] Nausea And Vomiting  . Morphine And Related Hives   Patient Measurements: Height:  (180.3 cm) Weight: 189 lb 2.5 oz (85.8 kg) IBW/kg (Calculated) : 75.3  Usual body weight ~ 100kg  Vital Signs: Temp: 98.1 F (36.7 C) (01/16 0504) Temp Source: Oral (01/16 0504) BP: 95/59 mmHg (01/16 0504) Pulse Rate: 76 (01/16 0504) Intake/Output from previous day: 01/15 0701 - 01/16 0700 In: 1647 [I.V.:13; TPN:1634] Out: 3525 [Urine:3225; Drains:300] Intake/Output from this shift:    Labs:  Recent Labs  12/27/14 0538  WBC 5.3  HGB 8.3*  HCT 27.9*  PLT 103*     Recent Labs  12/27/14 0538 12/29/14 0652  NA 137 134*  K 3.5 4.4  CL 98 98  CO2 34* 32  GLUCOSE 121* 101*  BUN 12 13  CREATININE 0.66 0.73  CALCIUM 7.6* 8.1*  MG 1.5  --   PHOS 2.9  --   PROT 5.2*  --   ALBUMIN 2.8*  --   AST 27  --   ALT 34  --   ALKPHOS 195*  --   BILITOT 0.7  --    Estimated Creatinine Clearance: 133.3 mL/min (by C-G formula based on Cr of 0.73).    Recent Labs  12/28/14 1648 12/29/14 0039 12/29/14 0752  GLUCAP 100* 130* 92   Medical History: Past Medical History  Diagnosis Date  . 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 Hepatomegaly 05/04/2011  . Fatty liver 05/04/2011    "stage III fatty liver fibrosis" (11/15/2012)  . GERD (gastroesophageal reflux disease)   .  Chronic liver disease     /notes 11/15/2012  . Pericarditis     [name] 11/15/2012  . Hypertension   . Pneumonia 1977  . Shortness of breath     "all the time right now" (11/15/2012)  . History of blood transfusion 2004  . History of stomach ulcers   . Duodenal ulcer   . Depression   . Kidney stones     bilaterally/notes 11/15/2012  . Hepatic fibrosis     [name] 11/15/2012  . Anemia   . Pericardial effusion 10/29/2012    moderate to large/notes 11/15/2012  . Anxiety   . Hepatitis   . Crohn's disease   . ED (erectile dysfunction)   . Cirrhosis     secondary to drug effect, +/- fatty liver   Insulin Requirements in the past 24 hours:  0 units  Current Nutrition:  TPN  Assessment: Brief narrative: 39 y/o ? hx crohns diag ~2002, symptoms since age 39 s/p Gastrojejunostomy 2004 complicated open abd + Mesh, Cirrhosis 2/2 to MTX use? 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 States that he just been seen at [facility] 12/05/14 to change out his PEG tube to a larger size but  was not done due to low BP.  Was sent home to be with family over holidays. Over that period of time he progressively got weaker and weaker and felt more ill or nauseous and was unable to keep anything down.  He was hospitalized at [facility] 12/27-31 and discharged with TPN but was readmitted due to choking and possible aspiration after eating at home.   Evaluation at [facility] for liver/colon transplant is pending.  Plan transfer to [facility] when bed available.  K+ improved within goal range today. Na slightly low. On Lasix & oral potassium replacement, spironolactone and metolazone. CBGs mildly increased with addition of Solu-Cortef and correction insulin given as needed. 24hr I/O - . Will continue at decreased rate as per discussion with Dr Karilyn Cota to limit fluid intake/weight gain.  Unable to  concentrate TPN due to limitations with premixed Clinimix.  Estimated Nutritional Needs: Kcal: 2100-2300 Protein: 96-106 grams Fluid: 2.1-2.3 L  Plan:   Continue TPN with Clinimix E 5/15 at 65ml/hr   Provide lipids (MWF due to shortage), trace elements (daily), and MVI (daily) with TPN  Continue sliding scale insulin with CBG checks q8hrs  Monitor lytes, triglycerides, glucose tolerance, renal fxn, and fluid status  F/U fluid status with Dr Karilyn Cota prior to any rate increases.  [place], [place] 12/29/2014,9:21 AM

## 2014-12-30 ENCOUNTER — Inpatient Hospital Stay (HOSPITAL_COMMUNITY): Payer: Medicare Other

## 2014-12-30 LAB — BASIC METABOLIC PANEL
Anion gap: 7 (ref 5–15)
BUN: 14 mg/dL (ref 6–23)
CO2: 33 mmol/L — ABNORMAL HIGH (ref 19–32)
CREATININE: 0.77 mg/dL (ref 0.50–1.35)
Calcium: 8.5 mg/dL (ref 8.4–10.5)
Chloride: 97 mEq/L (ref 96–112)
GFR calc Af Amer: >90 mL/min (ref >90)
GFR calc non Af Amer: >90 mL/min (ref >90)
GLUCOSE: 99 mg/dL (ref 70–99)
Potassium: 4.1 mmol/L (ref 3.5–5.1)
SODIUM: 137 mmol/L (ref 135–145)

## 2014-12-30 LAB — GLUCOSE, CAPILLARY
GLUCOSE-CAPILLARY: 101 mg/dL — AB (ref 70–99)
GLUCOSE-CAPILLARY: 154 mg/dL — AB (ref 70–99)
Glucose-Capillary: 107 mg/dL — ABNORMAL HIGH (ref 70–99)

## 2014-12-30 MED ORDER — CIPROFLOXACIN HCL 250 MG PO TABS
500.0000 mg | ORAL_TABLET | Freq: Two times a day (BID) | ORAL | Status: DC
  Administered 2014-12-30 – 2015-01-01 (×4): 500 mg via ORAL
  Filled 2014-12-30 (×4): qty 2

## 2014-12-30 MED ORDER — CALCIUM CARBONATE-VITAMIN D 500-200 MG-UNIT PO TABS
2.0000 | ORAL_TABLET | Freq: Two times a day (BID) | ORAL | Status: DC
  Administered 2014-12-30 – 2015-01-01 (×5): 2 via ORAL
  Filled 2014-12-30 (×3): qty 2
  Filled 2014-12-30: qty 1
  Filled 2014-12-30: qty 2
  Filled 2014-12-30: qty 1

## 2014-12-30 MED ORDER — MUPIROCIN CALCIUM 2 % EX CREA
TOPICAL_CREAM | Freq: Two times a day (BID) | CUTANEOUS | Status: DC
  Administered 2014-12-31 – 2015-01-01 (×3): via TOPICAL
  Filled 2014-12-30: qty 15

## 2014-12-30 MED ORDER — METOLAZONE 5 MG PO TABS
5.0000 mg | ORAL_TABLET | Freq: Two times a day (BID) | ORAL | Status: DC
  Administered 2014-12-30 – 2015-01-01 (×5): 5 mg via ORAL
  Filled 2014-12-30 (×5): qty 1

## 2014-12-30 MED ORDER — HYDROMORPHONE HCL 2 MG/ML IJ SOLN
4.0000 mg | INTRAMUSCULAR | Status: DC | PRN
  Administered 2014-12-30 – 2014-12-31 (×11): 4 mg via INTRAVENOUS
  Filled 2014-12-30 (×11): qty 2

## 2014-12-30 MED ORDER — FUROSEMIDE 10 MG/ML IJ SOLN
80.0000 mg | Freq: Two times a day (BID) | INTRAMUSCULAR | Status: DC
  Administered 2014-12-30 – 2015-01-01 (×4): 80 mg via INTRAVENOUS
  Filled 2014-12-30 (×4): qty 8

## 2014-12-30 MED ORDER — IOHEXOL 300 MG/ML  SOLN
25.0000 mL | Freq: Once | INTRAMUSCULAR | Status: AC | PRN
  Administered 2014-12-30: 25 mL via ORAL

## 2014-12-30 MED ORDER — TRACE MINERALS CR-CU-F-FE-I-MN-MO-SE-ZN IV SOLN
INTRAVENOUS | Status: AC
  Administered 2014-12-30: 18:00:00 via INTRAVENOUS
  Filled 2014-12-30: qty 1440

## 2014-12-30 NOTE — Progress Notes (Signed)
PARENTERAL NUTRITION CONSULT NOTE  Pharmacy Consult for TPN Indication: prolonged ileus  Allergies  Allergen Reactions  . Remicade [Infliximab]     Dr Karilyn Cota states previous respiratory arrest not related to remicade. Dr Karilyn Cota states remicade not a drug related allergy. 07-07-2013 at 1025 rapid response called to PACU- Patient difficulty breathing after infusion of Remicade infusing. Do NOT Give Remicade!  . Fentanyl And Related Itching    Swelling, Redness  . Alfentanil Itching    Swelling, Redness  . Wellbutrin [Bupropion] Nausea And Vomiting  . Morphine And Related Hives   Patient Measurements: Height: 5\' 11"  (180.3 cm) Weight: 185 lb 14.4 oz (84.324 kg) IBW/kg (Calculated) : 75.3  Usual body weight ~ 100kg  Vital Signs: Temp: 98.8 F (37.1 C) (01/17 0537) Temp Source: Oral (01/17 0537) BP: 102/59 mmHg (01/17 0537) Pulse Rate: 90 (01/17 0537) Intake/Output from previous day: 01/16 0701 - 01/17 0700 In: 960 [P.O.:240; TPN:720] Out: 3875 [Urine:3875] Intake/Output from this shift:    Labs: No results for input(s): WBC, HGB, HCT, PLT, APTT, INR in the last 72 hours.   Recent Labs  12/29/14 0652 12/30/14 0627  NA 134* 137  K 4.4 4.1  CL 98 97  CO2 32 33*  GLUCOSE 101* 99  BUN 13 14  CREATININE 0.73 0.77  CALCIUM 8.1* 8.5   Estimated Creatinine Clearance: 133.3 mL/min (by C-G formula based on Cr of 0.77).    Recent Labs  12/29/14 1556 12/29/14 2357 12/30/14 0754  GLUCAP 90 154* 107*   Medical History: Past Medical History  Diagnosis Date  . 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 Hepatomegaly 05/04/2011  . Fatty liver 05/04/2011    "stage III fatty liver fibrosis" (11/15/2012)  . GERD (gastroesophageal reflux disease)   . Chronic liver disease     /notes 11/15/2012  . Pericarditis     [name] 11/15/2012  . Hypertension   . Pneumonia 1977  . Shortness of breath      "all the time right now" (11/15/2012)  . History of blood transfusion 2004  . History of stomach ulcers   . Duodenal ulcer   . Depression   . Kidney stones     bilaterally/notes 11/15/2012  . Hepatic fibrosis     [name] 11/15/2012  . Anemia   . Pericardial effusion 10/29/2012    moderate to large/notes 11/15/2012  . Anxiety   . Hepatitis   . Crohn's disease   . ED (erectile dysfunction)   . Cirrhosis     secondary to drug effect, +/- fatty liver   Insulin Requirements in the past 24 hours:  3 units SSI  Current Nutrition:  TPN  Assessment: Brief narrative: 39 y/o ? 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 States that he just been seen at [facility] 12/05/14 to change out his PEG tube to a larger size but was not done due to low BP.  Was sent home to be with family over holidays. Over that period of time he progressively got weaker and weaker and felt more ill or nauseous and was unable to keep anything down.  He was hospitalized at [facility] 12/27-31 and discharged with TPN but  was readmitted due to choking and possible aspiration after eating at home.   Evaluation at [facility] for liver/colon transplant is pending.  Plan transfer to [facility] when bed available.  K+ improved within goal range today. On Lasix & oral potassium replacement, spironolactone and metolazone X 1. CBGs mildly increased with addition of Solu-Cortef and correction insulin given as needed. 24hr I/O appears imcomplete. Will continue at decreased rate as per discussion with Dr Karilyn Cota to limit fluid intake/weight gain.  Unable to concentrate TPN due to limitations with premixed Clinimix.  Estimated Nutritional Needs: Kcal: 2100-2300 Protein: 96-106 grams Fluid: 2.1-2.3  L  Plan:   Continue TPN with Clinimix E 5/15 at 27ml/hr   Provide lipids (MWF due to shortage), trace elements (daily), and MVI (daily) with TPN  Continue sliding scale insulin with CBG checks q8hrs  Monitor lytes, triglycerides, glucose tolerance, renal fxn, and fluid status  Strict I/Os ordered.  F/U fluid status with Dr Karilyn Cota prior to any rate increases.  Lamonte Richer R 12/30/2014,9:13 AM

## 2014-12-30 NOTE — Progress Notes (Signed)
  Subjective:  Patient states he is passing flatus and had 2 bowel movements in the last 24 hours. He describes his stool to be mushy. He denies melena or rectal bleeding. He continues to complain of abdominal pain. OxyContin dose is being reduced because it makes him sleepy. He is still having some drainage around gastrostomy tube site. He states he's had this strange ever since the tube was placed.   Objective: Blood pressure 108/68, pulse 97, temperature 98.4 F (36.9 C), temperature source Oral, resp. rate 20, height  (1.803 m), weight 185 lb 14.4 oz (84.324 kg), SpO2 97 %. Patient is alert. He is watching movie on his laptop. Abdomen. Abdomen appears to be less distended. There is mucopurulent drainage around the gastrostomy tube site. There is erythema to skin. He has moderate tenderness primarily involving right half of his abdomen. He has some tenderness in the left side. Right side of his abdomen is firm and left-sided soft. Lower extremity edema remains unchanged. It is more on the left than on the right side.  Urine output 3875 mL last 24 hours   Labs/studies Results:   BMET   Recent Labs  12/29/14 0652 12/30/14 0627  NA 134* 137  K 4.4 4.1  CL 98 97  CO2 32 33*  GLUCOSE 101* 99  BUN 13 14  CREATININE 0.73 0.77  CALCIUM 8.1* 8.5     Assessment:  #1. Fluid overload. He has gained 1 pound in the last 24 hours even though he had close to 4 L of urine. Oral intake appears to be minimal. I doubt that he has ascites. Discussed with Dr. Kerry Hough about getting abdominal CT. Since he has asymmetric edema to lower extremities would be good idea to rule out DVT. #2. Small bowel Crohn's disease. He is tolerating full liquid diet. He is passing flatus and his bowels are moving. Therefore he does not have high-grade stricture. Small bowel could be evaluated with abdominal CT. #3. Persistent drainage around gastrostomy tube site. Will reculture. Dr. Kerry Hough is changing his  antibiotic. #4. Anemia. #5. Chronic abdominal pain. He is on both by mouth as well as IV narcotic. #6. Cirrhosis. Last MELD score was 9. We will recalculate score with blood work in a.m. #7. Osteoporosis. He received pamidronate yesterday.  Recommendations;  Reculture drainage from around gastrostomy tube site. Abdominopelvic CT with oral contrast in a.m. contrast can be given through gastrostomy tube.

## 2014-12-30 NOTE — Progress Notes (Signed)
[facility] PROGRESS NOTE  David [name] ZOX:[id number] DOB: 01/10/1976 DOA: 12/15/2014 PCP: [name], MD  Assessment/Plan: 1. Cellulitis around gastrostomy tube. Wound culture shows rare Pseudomonas. Unclear if this is a true pathogen. He has been afebrile with a normal WBC count. Completed course of bactrim with improvement. Continue on topical bactroban 2. Aspiration pneumonia. Admitted with shortness of breath and chest x-ray findings of aspiration. No hypoxia or respiratory symptoms. Appears to have resolved with treatment. Per GI, on full liquids 3. Nutrition. Currently on TPN. Gastrostomy tube is in place for decompression. 4. Cirrhosis. Felt to be related to methotrexate use in the past.  5. Anemia of chronic disease with iron deficiency. No evidence of bleeding. Continue to monitor. 6. Anasarca. Contributory factor may be hypoalbuminemia related to cirrhosis. Patient received albumin infusion. Urine output over last 24 hours has improved. Strict monitoring of intake and output. Aldactone is also been added to his regimen. Increase lasix to  iv bid and add bid metolazone. Continue to monitor urine output. Place on fluid restriction. May need to re evaluate need for paracentesis 7. End-stage Crohn's disease. Appreciate GI assistance. 8. Chronic steroid therapy for Crohn's disease. Currently on intravenous hydrocortisone. We'll transition to oral prednisone on discharge 9. Gastroparesis. Stable. Continue Reglan, PPI, Carafate.   Code Status: full code Family Communication: discussed with patient and wife Disposition Plan: [facility] hospitalist is no longer accepting patient in transfer and feels that transplant evaluation can be done as an outpatient. Plan will be to discharge home once improved   Consultants:  Gastroenterology  Procedures:    Antibiotics:  Unasyn 1/8 >> 1/13  Vancomycin 1/8 >> 1/13  Zosyn 1/2 >> 1/5  Augmentin 1/5 >> 1/8  Bactrim  1/13>>1/17  HPI/Subjective: Feels the pain is worse today. Describes it as tightness and abdomen as well as his legs. Feels that his edema is contributing to this. Tolerating full liquids without any vomiting. Having bowel movements and passing flatus. Does not feel that the oxycontin is helping  Objective: Filed Vitals:   12/30/14 0537  BP: 102/59  Pulse: 90  Temp: 98.8 F (37.1 C)  Resp: 18    Intake/Output Summary (Last 24 hours) at 12/30/14 1209 Last data filed at 12/30/14 0900  Gross per 24 hour  Intake   1080 ml  Output   3875 ml  Net  -2795 ml   Filed Weights   12/29/14 0550 12/29/14 0940 12/30/14 0537  Weight: 85.8 kg (189 lb 2.5 oz) 83.87 kg (184 lb 14.4 oz) 84.324 kg (185 lb 14.4 oz)    Exam:   General:  NAD  Cardiovascular: S1, s2 RRR  Respiratory: CTA B  Abdomen: distended, diffusely tender, bs+, PEG tube with surrounding erythema improving  Musculoskeletal: 2-3+ edema b/l in LE   Data Reviewed: Basic Metabolic Panel:  Recent Labs Lab 12/24/14 0649 12/25/14 0607 12/26/14 0638 12/27/14 0538 12/29/14 0652 12/30/14 0627  NA 132* 134* 134* 137 134* 137  K 3.0* 3.3* 2.9* 3.5 4.4 4.1  CL 95* 96 96 98 98 97  CO2 31 33* 33* 34* 32 33*  GLUCOSE 165* 112* 105* 121* 101* 99  BUN CREATININE 0.49* 0.55 0.54 0.66 0.73 0.77  CALCIUM 7.3* 7.3* 7.4* 7.6* 8.1* 8.5  MG 1.5  --   --  1.5  --   --   PHOS 2.6  --   --  2.9  --   --    Liver Function  Tests:  Recent Labs Lab 12/24/14 0649 12/27/14 0538  AST 22 27  ALT 32 34  ALKPHOS 139* 195*  BILITOT 1.0 0.7  PROT 4.5* 5.2*  ALBUMIN 2.2* 2.8*    Recent Labs Lab 12/25/14 0607  LIPASE 16   No results for input(s): AMMONIA in the last 168 hours. CBC:  Recent Labs Lab 12/24/14 0649 12/27/14 0538  WBC 5.1 5.3  NEUTROABS 4.1  --   HGB 8.5* 8.3*  HCT 27.2* 27.9*  MCV 85.3 86.9  PLT 69* 103*   Cardiac Enzymes: No results for input(s): CKTOTAL, CKMB, CKMBINDEX, TROPONINI  in the last 168 hours. BNP (last 3 results) No results for input(s): PROBNP in the last 8760 hours. CBG:  Recent Labs Lab 12/29/14 0039 12/29/14 0752 12/29/14 1556 12/29/14 2357 12/30/14 0754  GLUCAP 130* 92 90 154* 107*    Recent Results (from the past 240 hour(s))  Wound culture     Status: None   Collection Time: 12/21/14  6:44 AM  Result Value Ref Range Status   Specimen Description G/T SITE  Final   Special Requests Normal  Final   Gram Stain   Final    FEW WBC PRESENT,BOTH PMN AND MONONUCLEAR NO SQUAMOUS EPITHELIAL CELLS SEEN NO ORGANISMS SEEN Performed at [facility]    Culture   Final    MULTIPLE ORGANISMS PRESENT, NONE PREDOMINANT Note: NO STAPHYLOCOCCUS AUREUS ISOLATED NO GROUP A STREP (S.PYOGENES) ISOLATED Performed at [facility]    Report Status 12/24/2014 FINAL  Final  Culture, Urine     Status: None   Collection Time: 12/21/14  7:35 AM  Result Value Ref Range Status   Specimen Description URINE, CLEAN CATCH  Final   Special Requests Normal  Final   Colony Count   Final    2,000 COLONIES/ML Performed at [facility]    Culture   Final    INSIGNIFICANT GROWTH Performed at [facility]    Report Status 12/22/2014 FINAL  Final  Culture, blood (routine x 2)     Status: None   Collection Time: 12/21/14  8:26 AM  Result Value Ref Range Status   Specimen Description BLOOD LEFT ANTECUBITAL  Final   Special Requests BOTTLES DRAWN AEROBIC AND ANAEROBIC 10CC BOTTLES  Final   Culture NO GROWTH 5 DAYS  Final   Report Status 12/26/2014 FINAL  Final  Culture, blood (routine x 2)     Status: None   Collection Time: 12/21/14  8:31 AM  Result Value Ref Range Status   Specimen Description BLOOD LEFT HAND  Final   Special Requests BOTTLES DRAWN AEROBIC ONLY 6CC BOTTLE  Final   Culture NO GROWTH 5 DAYS  Final   Report Status 12/26/2014 FINAL  Final  Clostridium Difficile by PCR     Status: None   Collection Time: 12/21/14  1:56  PM  Result Value Ref Range Status   C difficile by pcr NEGATIVE NEGATIVE Final  Wound culture     Status: None   Collection Time: 12/24/14  2:30 PM  Result Value Ref Range Status   Specimen Description WOUND GASTROSTOMY TUBE SITE  Final   Special Requests NONE  Final   Gram Stain   Final    RARE WBC PRESENT, PREDOMINANTLY MONONUCLEAR NO SQUAMOUS EPITHELIAL CELLS SEEN NO ORGANISMS SEEN Performed at [facility]    Culture   Final    RARE PSEUDOMONAS AERUGINOSA Performed at [facility]    Report Status 12/28/2014  FINAL  Final   Organism ID, Bacteria PSEUDOMONAS AERUGINOSA  Final      Susceptibility   Pseudomonas aeruginosa - MIC*    CEFEPIME 8 SENSITIVE Sensitive     CEFTAZIDIME 4 SENSITIVE Sensitive     CIPROFLOXACIN <=0.25 SENSITIVE Sensitive     GENTAMICIN 8 INTERMEDIATE Intermediate     IMIPENEM 1 SENSITIVE Sensitive     PIP/TAZO 8 SENSITIVE Sensitive     TOBRAMYCIN <=1 SENSITIVE Sensitive     * RARE PSEUDOMONAS AERUGINOSA  Clostridium Difficile by PCR     Status: None   Collection Time: 12/26/14 10:16 PM  Result Value Ref Range Status   C difficile by pcr NEGATIVE NEGATIVE Final     Studies: No results found.  Scheduled Meds: . acidophilus  1 capsule Oral Daily  . calcium-vitamin D  2 tablet Oral BID  . furosemide  80 mg Intravenous BID  . hydrocortisone sod succinate (SOLU-CORTEF) inj  50 mg Intravenous Daily  . insulin aspart  0-20 Units Subcutaneous Q8H  . metoCLOPramide (REGLAN) injection  5 mg Intravenous 3 times per day  . metolazone  5 mg Oral BID  . nystatin-triamcinolone   Topical BID  . oxyCODONE  20 mg Oral QID  . pantoprazole (PROTONIX) IV  40 mg Intravenous BID  . potassium chloride  40 mEq Oral TID  . sodium chloride  10 mL Intravenous Q12H  . sodium chloride  3 mL Intravenous Q12H  . spironolactone  25 mg Oral BID  . sulfamethoxazole-trimethoprim  1 tablet Oral Q12H  . zolpidem  10 mg Oral QHS   Continuous Infusions: . Marland KitchenTPN  (CLINIMIX-E) Adult 60 mL/hr at 12/29/14 1722  . Marland KitchenTPN (CLINIMIX-E) Adult      Principal Problem:   Aspiration pneumonia Active Problems:   Crohn's disease of both small and large intestine with fistula   Chronic right lower quadrant pain   Hypertension   Liver cirrhosis   Gastroparesis   Aspiration into airway   Anasarca   Anemia of chronic disease   Abdominal pain, generalized   Infection    Time spent:    Julian Askin  [facility] Pager [phone]. If 7PM-7AM, please contact night-coverage at [email or web address], password [facility] 12/30/2014, 12:09 PM  LOS: 15 days

## 2014-12-31 LAB — DIFFERENTIAL
BASOS ABS: 0 10*3/uL (ref 0.0–0.1)
Basophils Relative: 0 % (ref 0–1)
EOS PCT: 2 % (ref 0–5)
Eosinophils Absolute: 0.1 10*3/uL (ref 0.0–0.7)
LYMPHS PCT: 9 % — AB (ref 12–46)
Lymphs Abs: 0.3 10*3/uL — ABNORMAL LOW (ref 0.7–4.0)
Monocytes Absolute: 0.3 10*3/uL (ref 0.1–1.0)
Monocytes Relative: 9 % (ref 3–12)
NEUTROS PCT: 80 % — AB (ref 43–77)
Neutro Abs: 3.1 10*3/uL (ref 1.7–7.7)

## 2014-12-31 LAB — CBC
HCT: 23.4 % — ABNORMAL LOW (ref 39.0–52.0)
HEMOGLOBIN: 7.1 g/dL — AB (ref 13.0–17.0)
MCH: 26 pg (ref 26.0–34.0)
MCHC: 30.3 g/dL (ref 30.0–36.0)
MCV: 85.7 fL (ref 78.0–100.0)
PLATELETS: 87 10*3/uL — AB (ref 150–400)
RBC: 2.73 MIL/uL — ABNORMAL LOW (ref 4.22–5.81)
RDW: 20.6 % — ABNORMAL HIGH (ref 11.5–15.5)
WBC: 3.9 10*3/uL — ABNORMAL LOW (ref 4.0–10.5)

## 2014-12-31 LAB — GLUCOSE, CAPILLARY
Glucose-Capillary: 139 mg/dL — ABNORMAL HIGH (ref 70–99)
Glucose-Capillary: 105 mg/dL — ABNORMAL HIGH (ref 70–99)
Glucose-Capillary: 122 mg/dL — ABNORMAL HIGH (ref 70–99)

## 2014-12-31 LAB — COMPREHENSIVE METABOLIC PANEL
ALK PHOS: 256 U/L — AB (ref 39–117)
ALT: 45 U/L (ref 0–53)
ANION GAP: 7 (ref 5–15)
AST: 48 U/L — ABNORMAL HIGH (ref 0–37)
Albumin: 3 g/dL — ABNORMAL LOW (ref 3.5–5.2)
BUN: 15 mg/dL (ref 6–23)
CALCIUM: 8.3 mg/dL — AB (ref 8.4–10.5)
CO2: 32 mmol/L (ref 19–32)
Chloride: 94 mEq/L — ABNORMAL LOW (ref 96–112)
Creatinine, Ser: 0.82 mg/dL (ref 0.50–1.35)
GFR calc non Af Amer: >90 mL/min (ref >90)
Glucose, Bld: 137 mg/dL — ABNORMAL HIGH (ref 70–99)
Potassium: 3.8 mmol/L (ref 3.5–5.1)
Sodium: 133 mmol/L — ABNORMAL LOW (ref 135–145)
Total Bilirubin: 1.2 mg/dL (ref 0.3–1.2)
Total Protein: 5.1 g/dL — ABNORMAL LOW (ref 6.0–8.3)

## 2014-12-31 LAB — PREALBUMIN: Prealbumin: 13.9 mg/dL — ABNORMAL LOW (ref 17.0–34.0)

## 2014-12-31 LAB — PHOSPHORUS: Phosphorus: 4.9 mg/dL — ABNORMAL HIGH (ref 2.3–4.6)

## 2014-12-31 LAB — PREPARE RBC (CROSSMATCH)

## 2014-12-31 LAB — TRIGLYCERIDES: TRIGLYCERIDES: 41 mg/dL (ref <150)

## 2014-12-31 LAB — MAGNESIUM: Magnesium: 1.7 mg/dL (ref 1.5–2.5)

## 2014-12-31 MED ORDER — FAT EMULSION 20 % IV EMUL
250.0000 mL | INTRAVENOUS | Status: DC
  Administered 2014-12-31: 250 mL via INTRAVENOUS
  Filled 2014-12-31: qty 250

## 2014-12-31 MED ORDER — OXYCODONE HCL 20 MG/ML PO CONC
25.0000 mg | Freq: Four times a day (QID) | ORAL | Status: DC
  Administered 2014-12-31 – 2015-01-01 (×4): 25 mg via ORAL
  Filled 2014-12-31 (×4): qty 2

## 2014-12-31 MED ORDER — HYDROMORPHONE HCL 1 MG/ML IJ SOLN
3.0000 mg | INTRAMUSCULAR | Status: DC | PRN
  Administered 2014-12-31 – 2015-01-01 (×9): 3 mg via INTRAVENOUS
  Filled 2014-12-31 (×9): qty 3

## 2014-12-31 MED ORDER — SODIUM CHLORIDE 0.9 % IV SOLN
Freq: Once | INTRAVENOUS | Status: AC
  Administered 2014-12-31: 10 mL/h via INTRAVENOUS

## 2014-12-31 MED ORDER — TRACE MINERALS CR-CU-F-FE-I-MN-MO-SE-ZN IV SOLN
INTRAVENOUS | Status: DC
  Administered 2014-12-31: 19:00:00 via INTRAVENOUS
  Filled 2014-12-31: qty 1440

## 2014-12-31 NOTE — Progress Notes (Signed)
  Subjective:  Patient continues to complain of abdominal pain. He denies nausea or vomiting. He is using gastric tube for decompression which helps with bloating but not with pain. He reports having 5-6 stools last 24 hours. He denies melena or rectal bleeding. He believes he is having side effects with metoclopramide. He states he had few jerking spells.    Objective: Blood pressure 100/55, pulse 78, temperature 98.7 F (37.1 C), temperature source Oral, resp. rate 18, height 5\' 11"  (1.803 m), weight 182 lb 12.8 oz (82.918 kg), SpO2 98 %. Patient was sleeping when I walked in the room. He appears pale. He does not have tremors. Abdomen is somewhat distended. No purulent drainage noted around G site with worsening erythema to skin. I also noted gas escaping around the G-tube site. On palpation abdomen is firm in the right upper and lower quadrant and soft in the left half of her abdomen. No definite mass palpated. Pitting edema noted to both flanks as well as lower extremities. Edema in lower extremities has decreased. There remains asymmetric. It is more involving left leg and thigh.  Urine output reported to be 550 mL last 24 hours.  Labs/studies Results:   Recent Labs  12/31/14 0644  WBC 3.9*  HGB 7.1*  HCT 23.4*  PLT 87*    BMET   Recent Labs  12/29/14 0652 12/30/14 0627 12/31/14 0644  NA 134* 137 133*  K 4.4 4.1 3.8  CL 98 97 94*  CO2 32 33* 32  GLUCOSE 101* 99 137*  BUN 13 14 15   CREATININE 0.73 0.77 0.82  CALCIUM 8.1* 8.5 8.3*    LFT   Recent Labs  12/31/14 0644  PROT 5.1*  ALBUMIN 3.0*  AST 48*  ALT 45  ALKPHOS 256*  BILITOT 1.2     Doppler study of lower extremities negative for DVT. Abdominal-pelvic CT reviewed and films shared with patient. Slight decrease in right pleural effusion. Micronodular lung nodules.  Mild bilateral hydronephrosis but without ureteral obstruction. Extended distal esophageal wall, esophageal varices. Oh density blood pool  compatible with anemia elevated left hemidiaphragm, splenomegaly and cirrhotic appearing liver. Jejunal loops are dilated distal small bowel is not. No transition. Small volume ascites primarily around the bladder and Morrison's pouch and the lesser sac. Small stone in right kidney.  Assessment:  #1. Fluid overload. He has lost 3 pounds in the last 24 hours. Urine output is recorded to be 550 mL but it is incorrect. #2. Chronic abdominal pain. He is on IV and by mouth narcotics. #3. Crohn's disease with history of small bowel stricture. CT reveals dilated loops of jejunum buttransition noted. Patient is tolerating full liquids but he is not getting enough calories. He is therefore on parenteral nutrition. #4. Persistent drainage from around gastrostomy tube site. He appears to have superficial infection but is not going away. I'm afraid that gastric fistula would enlarge and he is also at risk for [facility] enough infection to involve deeper structures. #4. Cirrhosis. #5. Anemia. H&H has dropped significantly since last checked. No evidence of overt GI bleed. Discussed with  Dr. Kerry Hough. He will benefit from PRBC transfusion. #6. Possible side effect with metoclopramide.  Recommendations;  DC Metoclopromide. Transfusion with 2 units of PRBCs. I've asked nursing staff to change dressing at least 4 times a day. Await culture results. Will recontact Dr. Marval Regal at [facility].

## 2014-12-31 NOTE — Progress Notes (Signed)
PARENTERAL NUTRITION CONSULT NOTE  Pharmacy Consult for TPN Indication: prolonged ileus  Allergies  Allergen Reactions  . Remicade [Infliximab]     Dr Karilyn Cota states previous respiratory arrest not related to remicade. Dr Karilyn Cota states remicade not a drug related allergy. 07-07-2013 at 1025 rapid response called to PACU- Patient difficulty breathing after infusion of Remicade infusing. Do NOT Give Remicade!  . Fentanyl And Related Itching    Swelling, Redness  . Alfentanil Itching    Swelling, Redness  . Wellbutrin [Bupropion] Nausea And Vomiting  . Morphine And Related Hives   Patient Measurements: Height: 5\' 11"  (180.3 cm) Weight: 182 lb 12.8 oz (82.918 kg) IBW/kg (Calculated) : 75.3  Usual body weight ~ 100kg  Vital Signs: Temp: 98.7 F (37.1 C) (01/18 0640) Temp Source: Oral (01/18 0640) BP: 100/55 mmHg (01/18 0640) Pulse Rate: 78 (01/18 0640) Intake/Output from previous day: 01/17 0701 - 01/18 0700 In: 720 [P.O.:720] Out: 550 [Urine:550] Intake/Output from this shift: Total I/O In: 600 [P.O.:600] Out: -   Labs:  Recent Labs  12/31/14 0644  WBC 3.9*  HGB 7.1*  HCT 23.4*  PLT 87*     Recent Labs  12/29/14 0652 12/30/14 0627 12/31/14 0644  NA 134* 137 133*  K 4.4 4.1 3.8  CL 98 97 94*  CO2 32 33* 32  GLUCOSE 101* 99 137*  BUN 13 14 15   CREATININE 0.73 0.77 0.82  CALCIUM 8.1* 8.5 8.3*  MG  --   --  1.7  PHOS  --   --  4.9*  PROT  --   --  5.1*  ALBUMIN  --   --  3.0*  AST  --   --  48*  ALT  --   --  45  ALKPHOS  --   --  256*  BILITOT  --   --  1.2  TRIG  --   --  41   Estimated Creatinine Clearance: 130.1 mL/min (by C-G formula based on Cr of 0.82).    Recent Labs  12/30/14 1600 12/31/14 0008 12/31/14 0736  GLUCAP 101* 139* 105*   Medical History: Past Medical History  Diagnosis Date  . 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 Hepatomegaly  05/04/2011  . Fatty liver 05/04/2011    "stage III fatty liver fibrosis" (11/15/2012)  . GERD (gastroesophageal reflux disease)   . Chronic liver disease     /notes 11/15/2012  . Pericarditis     [name] 11/15/2012  . Hypertension   . Pneumonia 1977  . Shortness of breath     "all the time right now" (11/15/2012)  . History of blood transfusion 2004  . History of stomach ulcers   . Duodenal ulcer   . Depression   . Kidney stones     bilaterally/notes 11/15/2012  . Hepatic fibrosis     [name] 11/15/2012  . Anemia   . Pericardial effusion 10/29/2012    moderate to large/notes 11/15/2012  . Anxiety   . Hepatitis   . Crohn's disease   . ED (erectile dysfunction)   . 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 Vs NASH [Meld score  9], dysplastic hepatic nodule, EGD 11/14=grd 1 varices recent attempt at ex-lap 11/23 [not successful 2/2 to frozen abd] admitted with generalized abd pain, n,v. States that he just been seen at [facility] 12/05/14 to change out his PEG tube to a larger size but was not done due to low BP.  Was sent home to be with family over holidays. Over that period of time he progressively got weaker and weaker and felt more ill or nauseous and was unable to keep anything down.  He was hospitalized at [facility] 12/27-31 and discharged with TPN but was readmitted due to choking and possible aspiration after eating at home.   Evaluation at [facility] for liver/colon transplant is pending.  Plan transfer to [facility] when bed available.  K+ improved within goal range today. On Lasix & oral potassium replacement, spironolactone and metolazone X 1. CBGs mildly increased with addition of Solu-Cortef and correction insulin given as needed. 24hr I/O appears  imcomplete. Will continue at decreased rate as per discussion with Dr Karilyn Cota to limit fluid intake/weight gain.  Unable to concentrate TPN due to limitations with premixed Clinimix.  Estimated Nutritional Needs: Kcal: 2100-2300 Protein: 96-106 grams Fluid: 2.1-2.3 L  Plan:   Continue TPN with Clinimix E 5/15 at 25ml/hr   Provide lipids (MWF due to shortage), trace elements (daily), and MVI (daily) with TPN  D/C sliding scale insulin since sugars have been stable and typically < 150  Monitor lytes, triglycerides, glucose tolerance, renal fxn, and fluid status  Strict I/Os ordered.  F/U fluid status with Dr Karilyn Cota prior to any rate increases.  Valrie Hart A 12/31/2014,11:08 AM

## 2014-12-31 NOTE — Progress Notes (Signed)
[facility] PROGRESS NOTE  David [name]:[id number] DOB: 20-Oct-1976 DOA: 12/15/2014 PCP: [name], MD  Assessment/Plan: 1. Cellulitis around gastrostomy tube.  He has been afebrile with a normal WBC count. Completed course of bactrim but still has purulent discharge. Started on cipro for pseudomonas in wound culture. Continue on topical bactroban 2. Aspiration pneumonia. Admitted with shortness of breath and chest x-ray findings of aspiration. No hypoxia or respiratory symptoms. Appears to have resolved with treatment. Per GI, on full liquids 3. Nutrition. Currently on TPN. Gastrostomy tube is in place for decompression. 4. Cirrhosis. Felt to be related to methotrexate use in the past.  5. Anemia of chronic disease with iron deficiency. No evidence of bleeding. Hemoglobin is 7.1 today. Will give one unit of prbc today. Repeat CBC in the am 6. Anasarca. Contributory factor may be hypoalbuminemia related to cirrhosis. Patient received albumin infusion. Urine output over last 24 hours has improved. Strict monitoring of intake and output. Aldactone is also been added to his regimen. Increase lasix to  iv bid and add bid metolazone. Continue to monitor urine output. Place on fluid restriction. May need to re evaluate need for paracentesis 7. End-stage Crohn's disease. Appreciate GI assistance. 8. Chronic steroid therapy for Crohn's disease. Currently on intravenous hydrocortisone. We'll transition to oral prednisone on discharge 9. Gastroparesis. Stable. Continue Reglan, PPI, Carafate. 10. Acute on chronic abdominal pain. Related to crohns disease. Chronically on oxycodone. Will start to wean dilaudid today.   Code Status: full code Family Communication: discussed with patient Disposition Plan: [facility] hospitalist is no longer accepting patient in transfer and feels that transplant evaluation can be done as an outpatient. Plan will be to discharge home once  improved   Consultants:  Gastroenterology  Procedures:    Antibiotics:  Unasyn 1/8 >> 1/13  Vancomycin 1/8 >> 1/13  Zosyn 1/2 >> 1/5  Augmentin 1/5 >> 1/8  Bactrim 1/13>>1/17  cipro 1/17    HPI/Subjective: Pain is reasonably controlled. No vomiting.   Objective: Filed Vitals:   12/31/14 1406  BP: 116/66  Pulse: 103  Temp: 99.2 F (37.3 C)  Resp: 18    Intake/Output Summary (Last 24 hours) at 12/31/14 1416 Last data filed at 12/31/14 1300  Gross per 24 hour  Intake   1440 ml  Output    550 ml  Net    890 ml   Filed Weights   12/29/14 0940 12/30/14 0537 12/31/14 0640  Weight: 83.87 kg (184 lb 14.4 oz) 84.324 kg (185 lb 14.4 oz) 82.918 kg (182 lb 12.8 oz)    Exam:   General:  NAD  Cardiovascular: S1, s2 RRR  Respiratory: CTA B  Abdomen: distended, diffusely tender, bs+, PEG tube with surrounding erythema and purulent discharge  Musculoskeletal: 2-3+ edema b/l in LE, mildly improved today   Data Reviewed: Basic Metabolic Panel:  Recent Labs Lab 12/26/14 0638 12/27/14 0538 12/29/14 0652 12/30/14 0627 12/31/14 0644  NA 134* 137 134* 137 133*  K 2.9* 3.5 4.4 4.1 3.8  CL 96 98 98 97 94*  CO2 33* 34* 32 33* 32  GLUCOSE 105* 121* 101* 99 137*  BUN CREATININE 0.54 0.66 0.73 0.77 0.82  CALCIUM 7.4* 7.6* 8.1* 8.5 8.3*  MG  --  1.5  --   --  1.7  PHOS  --  2.9  --   --  4.9*   Liver Function Tests:  Recent Labs Lab 12/27/14 0538 12/31/14 0644  AST  27 48*  ALT 34 45  ALKPHOS 195* 256*  BILITOT 0.7 1.2  PROT 5.2* 5.1*  ALBUMIN 2.8* 3.0*    Recent Labs Lab 12/25/14 0607  LIPASE 16   No results for input(s): AMMONIA in the last 168 hours. CBC:  Recent Labs Lab 12/27/14 0538 12/31/14 0644  WBC 5.3 3.9*  NEUTROABS  --  3.1  HGB 8.3* 7.1*  HCT 27.9* 23.4*  MCV 86.9 85.7  PLT 103* 87*   Cardiac Enzymes: No results for input(s): CKTOTAL, CKMB, CKMBINDEX, TROPONINI in the last 168 hours. BNP (last 3  results) No results for input(s): PROBNP in the last 8760 hours. CBG:  Recent Labs Lab 12/29/14 2357 12/30/14 0754 12/30/14 1600 12/31/14 0008 12/31/14 0736  GLUCAP 154* 107* 101* 139* 105*    Recent Results (from the past 240 hour(s))  Wound culture     Status: None   Collection Time: 12/24/14  2:30 PM  Result Value Ref Range Status   Specimen Description WOUND GASTROSTOMY TUBE SITE  Final   Special Requests NONE  Final   Gram Stain   Final    RARE WBC PRESENT, PREDOMINANTLY MONONUCLEAR NO SQUAMOUS EPITHELIAL CELLS SEEN NO ORGANISMS SEEN Performed at [facility]    Culture   Final    RARE PSEUDOMONAS AERUGINOSA Performed at [facility]    Report Status 12/28/2014 FINAL  Final   Organism ID, Bacteria PSEUDOMONAS AERUGINOSA  Final      Susceptibility   Pseudomonas aeruginosa - MIC*    CEFEPIME 8 SENSITIVE Sensitive     CEFTAZIDIME 4 SENSITIVE Sensitive     CIPROFLOXACIN <=0.25 SENSITIVE Sensitive     GENTAMICIN 8 INTERMEDIATE Intermediate     IMIPENEM 1 SENSITIVE Sensitive     PIP/TAZO 8 SENSITIVE Sensitive     TOBRAMYCIN <=1 SENSITIVE Sensitive     * RARE PSEUDOMONAS AERUGINOSA  Clostridium Difficile by PCR     Status: None   Collection Time: 12/26/14 10:16 PM  Result Value Ref Range Status   C difficile by pcr NEGATIVE NEGATIVE Final     Studies: Ct Abdomen Pelvis Wo Contrast  12/30/2014   CLINICAL DATA:  Abdominal pain, tightness and swelling. Liver failure. Crohn's disease.  EXAM: CT ABDOMEN AND PELVIS WITHOUT CONTRAST  TECHNIQUE: Multidetector CT imaging of the abdomen and pelvis was performed following the standard protocol without IV contrast.  COMPARISON:  12/26/2014; 12/23/2014  FINDINGS: Lower chest: Micro nodular lung disease in both lung bases similar to prior. Atelectasis along the right hemidiaphragm. Slightly improved aeration in the right lower lobe. Mild improvement in the right middle lobe ground-glass opacities. Basilar nodules  very up to 1.2 cm in long axis. Wall thickening in the distal esophagus with contrast medium in the distal esophagus suggesting reflux. Elevated left hemidiaphragm. Low-density blood pool, suggesting anemia. Improved right pleural effusion. Gynecomastia bilaterally.  Hepatobiliary: Hepatic cirrhosis. Cholecystectomy. Periportal edema. Edema in the porta hepatis.  Pancreas: Unremarkable  Spleen: Stable considerable splenomegaly.  Adrenals/Urinary Tract: Cystic lesion along Morrison's pouch, 4.5 by 4.0 cm on image 32 series 2, stable, probably arising from the liver but possibly from the kidney. 2 mm right mid kidney nonobstructive calculus. Mild bilateral hydronephrosis with slight proximal hydroureter tapering out at about the level of the iliac crests. No other urinary tract calculi identified.  Stomach/Bowel: Peg tube in place. Wall thickening in the proximal stomach although some of this may be related to nondistention. Jejunal loops dilated 6.3 cm with scattered air-fluid levels. Appendix  not well seen.  Vascular/Lymphatic: Mild aortoiliac atherosclerotic calcification. Prominent upper abdominal varices. Paraesophageal varices. Indistinct external iliac nodes are borderline enlarged particularly on the left.  Reproductive: Unremarkable  Other: Diffuse subcutaneous and mesenteric edema. Retroperitoneal edema and presacral edema noted. Perihepatic ascites with fluid in the lesser sac and fluid infiltrating the omentum. Pelvic ascites just above the urinary bladder.  Musculoskeletal: Degenerative loss of articular space in the hips.  IMPRESSION: 1. Dominant changes from the exam 1 week ago are mild improvement in the right pleural effusion and right middle lobe airspace opacity; and mild reduction in ascites. 2. Mild bilateral hydronephrosis but without well-defined ureteral obstruction. 3. Common features with the exam from 1 week ago include thick walled distal esophagus with evidence of reflux; esophageal  varices ; low-density blood pool compatible with anemia ; elevated left hemidiaphragm with extensive left splenomegaly ; hepatic cirrhosis ; dilated proximal loops of jejunum which are abnormal but nonspecific and could be from ileus or low-grade obstruction ; diffuse third spacing of fluid; collections of ascites including above the urinary bladder, in Morison's pouch, and in the lesser sac; micro nodular lung disease in both lung bases ; and 2 mm right mid kidney nonobstructive calculus.   Electronically Signed   By: Herbie Baltimore M.D.   On: 12/30/2014 19:38   US Venous Img Lower Unilateral Left  12/30/2014   CLINICAL DATA:  LEFT leg edema for 3 weeks, pain  EXAM: LEFT LOWER EXTREMITY VENOUS DOPPLER ULTRASOUND  TECHNIQUE: Gray-scale sonography with graded compression, as well as color Doppler and duplex ultrasound were performed to evaluate the lower extremity deep venous systems from the level of the common femoral vein and including the common femoral, femoral, profunda femoral, popliteal and calf veins including the posterior tibial, peroneal and gastrocnemius veins when visible. The superficial great saphenous vein was also interrogated. Spectral Doppler was utilized to evaluate flow at rest and with distal augmentation maneuvers in the common femoral, femoral and popliteal veins.  COMPARISON:  03/08/2014  FINDINGS: Contralateral Common Femoral Vein: Respiratory phasicity is normal and symmetric with the symptomatic side. No evidence of thrombus. Normal compressibility.  Common Femoral Vein: No evidence of thrombus. Normal compressibility, respiratory phasicity and response to augmentation.  Saphenofemoral Junction: No evidence of thrombus. Normal compressibility and flow on color Doppler imaging.  Profunda Femoral Vein: No evidence of thrombus. Normal compressibility and flow on color Doppler imaging.  Femoral Vein: No evidence of thrombus. Normal compressibility, respiratory phasicity and response to  augmentation.  Popliteal Vein: No evidence of thrombus. Normal compressibility, respiratory phasicity and response to augmentation.  Calf Veins: No evidence of thrombus. Normal compressibility and flow on color Doppler imaging.  Superficial Great Saphenous Vein: No evidence of thrombus. Normal compressibility and flow on color Doppler imaging.  Venous Reflux:  None.  Other Findings:  Scattered subcutaneous edema LEFT calf.  IMPRESSION: No evidence of deep venous thrombosis in the LEFT lower extremity.  Subcutaneous edema LEFT calf.   Electronically Signed   By: Ulyses Southward M.D.   On: 12/30/2014 18:40    Scheduled Meds: . sodium chloride   Intravenous Once  . acidophilus  1 capsule Oral Daily  . calcium-vitamin D  2 tablet Oral BID  . ciprofloxacin  500 mg Oral BID  . furosemide  80 mg Intravenous BID  . hydrocortisone sod succinate (SOLU-CORTEF) inj  50 mg Intravenous Daily  . metolazone  5 mg Oral BID  . mupirocin cream   Topical BID  . nystatin-triamcinolone  Topical BID  . oxyCODONE  25 mg Oral QID  . pantoprazole (PROTONIX) IV  40 mg Intravenous BID  . potassium chloride  40 mEq Oral TID  . sodium chloride  10 mL Intravenous Q12H  . sodium chloride  3 mL Intravenous Q12H  . spironolactone  25 mg Oral BID  . zolpidem  10 mg Oral QHS   Continuous Infusions: . Marland KitchenTPN (CLINIMIX-E) Adult     And  . fat emulsion    . 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 If 7PM-7AM, please contact night-coverage at [email or web address], password [facility] 12/31/2014, 2:16 PM  LOS: 16 days

## 2015-01-01 DIAGNOSIS — K760 Fatty (change of) liver, not elsewhere classified: Secondary | ICD-10-CM | POA: Diagnosis not present

## 2015-01-01 DIAGNOSIS — K269 Duodenal ulcer, unspecified as acute or chronic, without hemorrhage or perforation: Secondary | ICD-10-CM | POA: Diagnosis not present

## 2015-01-01 DIAGNOSIS — K50813 Crohn's disease of both small and large intestine with fistula: Secondary | ICD-10-CM | POA: Diagnosis not present

## 2015-01-01 DIAGNOSIS — K219 Gastro-esophageal reflux disease without esophagitis: Secondary | ICD-10-CM | POA: Diagnosis not present

## 2015-01-01 DIAGNOSIS — L89151 Pressure ulcer of sacral region, stage 1: Secondary | ICD-10-CM | POA: Diagnosis not present

## 2015-01-01 DIAGNOSIS — K769 Liver disease, unspecified: Secondary | ICD-10-CM | POA: Diagnosis not present

## 2015-01-01 DIAGNOSIS — Z452 Encounter for adjustment and management of vascular access device: Secondary | ICD-10-CM | POA: Diagnosis not present

## 2015-01-01 DIAGNOSIS — K7460 Unspecified cirrhosis of liver: Secondary | ICD-10-CM | POA: Diagnosis not present

## 2015-01-01 DIAGNOSIS — K7689 Other specified diseases of liver: Secondary | ICD-10-CM | POA: Diagnosis not present

## 2015-01-01 DIAGNOSIS — K739 Chronic hepatitis, unspecified: Secondary | ICD-10-CM | POA: Diagnosis not present

## 2015-01-01 DIAGNOSIS — Z5181 Encounter for therapeutic drug level monitoring: Secondary | ICD-10-CM | POA: Diagnosis not present

## 2015-01-01 DIAGNOSIS — I10 Essential (primary) hypertension: Secondary | ICD-10-CM | POA: Diagnosis not present

## 2015-01-01 DIAGNOSIS — K3184 Gastroparesis: Secondary | ICD-10-CM | POA: Diagnosis not present

## 2015-01-01 DIAGNOSIS — Z4801 Encounter for change or removal of surgical wound dressing: Secondary | ICD-10-CM | POA: Diagnosis not present

## 2015-01-01 DIAGNOSIS — K603 Anal fistula: Secondary | ICD-10-CM | POA: Diagnosis not present

## 2015-01-01 DIAGNOSIS — K5090 Crohn's disease, unspecified, without complications: Secondary | ICD-10-CM | POA: Diagnosis not present

## 2015-01-01 LAB — CBC
HCT: 25.8 % — ABNORMAL LOW (ref 39.0–52.0)
HEMOGLOBIN: 8 g/dL — AB (ref 13.0–17.0)
MCH: 26.2 pg (ref 26.0–34.0)
MCHC: 31 g/dL (ref 30.0–36.0)
MCV: 84.6 fL (ref 78.0–100.0)
Platelets: 94 10*3/uL — ABNORMAL LOW (ref 150–400)
RBC: 3.05 MIL/uL — ABNORMAL LOW (ref 4.22–5.81)
RDW: 19.6 % — ABNORMAL HIGH (ref 11.5–15.5)
WBC: 6 10*3/uL (ref 4.0–10.5)

## 2015-01-01 LAB — TYPE AND SCREEN
ABO/RH(D): A POS
Antibody Screen: NEGATIVE
Unit division: 0

## 2015-01-01 LAB — BASIC METABOLIC PANEL
Anion gap: 7 (ref 5–15)
BUN: 16 mg/dL (ref 6–23)
CO2: 34 mmol/L — AB (ref 19–32)
CREATININE: 0.79 mg/dL (ref 0.50–1.35)
Calcium: 8 mg/dL — ABNORMAL LOW (ref 8.4–10.5)
Chloride: 92 mEq/L — ABNORMAL LOW (ref 96–112)
GFR calc non Af Amer: >90 mL/min (ref >90)
Glucose, Bld: 133 mg/dL — ABNORMAL HIGH (ref 70–99)
POTASSIUM: 3.3 mmol/L — AB (ref 3.5–5.1)
Sodium: 133 mmol/L — ABNORMAL LOW (ref 135–145)

## 2015-01-01 LAB — GLUCOSE, CAPILLARY
GLUCOSE-CAPILLARY: 207 mg/dL — AB (ref 70–99)
Glucose-Capillary: 145 mg/dL — ABNORMAL HIGH (ref 70–99)
Glucose-Capillary: 110 mg/dL — ABNORMAL HIGH (ref 70–99)

## 2015-01-01 MED ORDER — INSULIN ASPART 100 UNIT/ML ~~LOC~~ SOLN
0.0000 [IU] | Freq: Four times a day (QID) | SUBCUTANEOUS | Status: DC
Start: 2015-01-01 — End: 2015-01-01

## 2015-01-01 MED ORDER — METOLAZONE 5 MG PO TABS: 5.0000 mg | ORAL_TABLET | Freq: Two times a day (BID) | ORAL | Status: AC

## 2015-01-01 MED ORDER — SPIRONOLACTONE 25 MG PO TABS: 25.0000 mg | ORAL_TABLET | Freq: Two times a day (BID) | ORAL | Status: DC

## 2015-01-01 MED ORDER — POTASSIUM CHLORIDE 20 MEQ/15ML (10%) PO SOLN: 40.0000 meq | Freq: Three times a day (TID) | ORAL | Status: AC

## 2015-01-01 MED ORDER — PREDNISONE 20 MG PO TABS: 20.0000 mg | ORAL_TABLET | Freq: Every day | ORAL | Status: DC

## 2015-01-01 MED ORDER — POTASSIUM CHLORIDE 20 MEQ/15ML (10%) PO SOLN
40.0000 meq | Freq: Once | ORAL | Status: AC
  Administered 2015-01-01: 40 meq via ORAL
  Filled 2015-01-01: qty 30

## 2015-01-01 MED ORDER — CLINIMIX E/DEXTROSE (5/15) 5 % IV SOLN
INTRAVENOUS | Status: DC
  Filled 2015-01-01: qty 1440

## 2015-01-01 MED ORDER — CIPROFLOXACIN HCL 500 MG PO TABS: 500.0000 mg | ORAL_TABLET | Freq: Two times a day (BID) | ORAL | Status: DC

## 2015-01-01 MED ORDER — OXYCODONE HCL 20 MG/ML PO CONC: 30.0000 mg | Freq: Four times a day (QID) | ORAL | Status: DC

## 2015-01-01 NOTE — Discharge Summary (Signed)
Physician Discharge Summary  CHAO BLAZEJEWSKI ZOX:[id number] DOB: 24-Feb-1976 DOA: 12/15/2014  PCP: [name], MD  Admit date: 12/15/2014 Discharge date: 01/01/2015  Time spent: 45 minutes  Recommendations for Outpatient Follow-up:  1. Patient will follow up with Dr. Karilyn Cota next week. 2. He has an appointment at [facility] next week to be evaluated for liver/small bowel transplant.  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 He was found to have possible aspiration pneumonia. The patient has end-stage Crohn's disease and is mostly on a liquid diet. He was advised not to have any solid food. Despite these recommendations, patient went home from the hospital on 12/14/14 and had a grilled cheese sandwich. He began to vomit and became short of breath. He presented back to the hospital the following day with aspiration pneumonia. He was admitted to the hospital for further treatments.  Hospital Course:  1. Cellulitis around gastrostomy tube. Patient was noted to have significant fever, leukocytosis and discharge from around his gastrostomy tube. He was initially placed on broad-spectrum antibiotics but will culture was positive for Pseudomonas. He is a transitional ciprofloxacin with some improvement of his discharge. 2. Aspiration  pneumonia. Admitted with shortness of breath and chest x-ray findings of aspiration. No hypoxia or respiratory symptoms at present. Appears to have resolved with antibiotic treatment. Per GI, continue on full liquids 3. Nutrition. Currently on TPN for supportive measures. Gastrostomy tube is in place for decompression. 4. Cirrhosis. Felt to be related to methotrexate use in the past. Patient plans follow-up at [facility] for consideration for liver transplantation 5. Anemia of chronic disease with iron deficiency. No evidence of bleeding. Transfuse 1 unit of PRBC during his hospital stay. 6. Anasarca. Contributory factor may be hypoalbuminemia related to cirrhosis. Patient received albumin infusion during his hospital stay. He was placed on high-dose Lasix as well as Zaroxolyn with improvement in his overall anasarca. He still has 2-3+ pitting edema and will need continued diuresis. He wishes to continue this treatment at home. 7. End-stage Crohn's disease. Followed by GI during his hospital stay. Prednisone dependent. Plans to follow-up with [facility] for consideration of small bowel transplant. 8. Chronic steroid therapy for Crohn's disease. He received intravenous hydrocortisone during his hospital stay. He was transitioned to oral prednisone on discharge 9. Gastroparesis. Stable. Continue Reglan, PPI, Carafate. 10. Acute on chronic abdominal pain. Related to crohns disease. Chronically on oxycodone.   Patient was advised to stay in the hospital for further diuresis. He was very adamant about being discharged home today continued diuresis at home. He was advised to return to the hospital he had any worsening of his symptoms.  Procedures:    Consultations:  gastroenterology  Discharge Exam: Filed Vitals:   01/01/15 1433  BP: 101/69  Pulse: 98  Temp: 99  F (37.2 C)  Resp:     General: NAD Cardiovascular: S1, S2 RRR Respiratory: CTA B Ext: 2-3+ pitting edema  Discharge  Instructions   Discharge Instructions    Call MD for:  extreme fatigue    Complete by:  As directed      Call MD for:  persistant dizziness or light-headedness    Complete by:  As directed      Call MD for:  temperature >100.4    Complete by:  As directed      Diet - low sodium heart healthy    Complete by:  As directed      Increase activity slowly    Complete by:  As directed           Discharge Medication List as of 01/01/2015  3:28 PM    START taking these medications   Details  ciprofloxacin (CIPRO) 500 MG tablet Take 1 tablet (500 mg total) by mouth 2 (two) times daily., Starting 01/01/2015, Until Discontinued, Print    metolazone (ZAROXOLYN) 5 MG tablet Take 1 tablet (5 mg total) by mouth 2 (two) times daily., Starting 01/01/2015, Until Discontinued, Normal    spironolactone (ALDACTONE) 25 MG tablet Take 1 tablet (25 mg total) by mouth 2 (two) times daily., Starting 01/01/2015, Until Discontinued, Print      CONTINUE these medications which have CHANGED   Details  oxyCODONE (ROXICODONE INTENSOL) 20 MG/ML concentrated solution Take 1.5-2 mLs (30-40 mg total) by mouth 4 (four) times daily., Starting 01/01/2015, Until Discontinued, Print    potassium chloride 20 MEQ/15ML (10%) SOLN Take 30 mLs (40 mEq total) by mouth 3 (three) times daily., Starting 01/01/2015, Until Discontinued, Print    predniSONE (DELTASONE) 20 MG tablet Take 1 tablet (20 mg total) by mouth daily with breakfast., Starting 01/01/2015, Until Discontinued, Print      CONTINUE these medications which have NOT CHANGED   Details  calcium-vitamin D (OSCAL 500/200 D-3) 500-200 MG-UNIT per tablet Take 1 tablet by mouth 2 (two) times daily., Starting 11/27/2014, Until Discontinued, OTC    ferrous gluconate (FERGON) 324 MG tablet Take 324 mg by mouth 3 (three) times daily., Starting 11/21/2014, Until Discontinued, Historical Med    fish oil-omega-3 fatty acids 1000 MG capsule Take 1 g by mouth 3 (three) times daily.,  Starting 02/09/2012, Until Discontinued, Historical Med    pantoprazole (PROTONIX) 40 MG tablet Take 1 tablet (40 mg total) by mouth 2 (two) times daily., Starting 06/18/2014, Until Discontinued, No Print    Probiotic Product (ALIGN) 4 MG CAPS Take 4 mg by mouth daily. , Until Discontinued, Historical Med    sucralfate (CARAFATE) 1 G tablet Take 1 g by mouth 3 (three) times daily before meals., Until Discontinued, Historical Med    dicyclomine (BENTYL) 10 MG capsule Take 1 capsule (10 mg total) by mouth 3 (three) times daily as needed (stomach cramps)., Starting 06/18/2014, Until Discontinued, Normal    metoCLOPramide (REGLAN) 5 MG tablet Take 1 tablet (5 mg total) by mouth 3 (three) times daily as needed for nausea., Starting 05/14/2014, Until Discontinued, Print    polyethylene glycol (MIRALAX / GLYCOLAX) packet Take 17 g by mouth daily as needed for mild constipation or moderate constipation. , Starting 10/26/2014, Until Discontinued, Historical Med    promethazine (PHENERGAN) 25 MG tablet Take 1 tablet (25 mg total) by mouth 2 (two) times daily as needed. nausea, Starting 11/27/2014, Until Discontinued, Normal      STOP taking these medications  furosemide (LASIX) 40 MG tablet      levofloxacin (LEVAQUIN) 500 MG tablet      promethazine (PHENERGAN) 25 MG/ML injection        Allergies  Allergen Reactions  . Remicade [Infliximab]     Dr Karilyn Cota states previous respiratory arrest not related to remicade. Dr Karilyn Cota states remicade not a drug related allergy. 07-07-2013 at 1025 rapid response called to PACU- Patient difficulty breathing after infusion of Remicade infusing. Do NOT Give Remicade!  . Fentanyl And Related Itching    Swelling, Redness  . Alfentanil Itching    Swelling, Redness  . Wellbutrin [Bupropion] Nausea And Vomiting  . Morphine And Related Hives   Follow-up Information    Follow up with [facility].   Contact information:   [address] [place] [address] [phone]       Follow up with Malissa Hippo, MD. Schedule an appointment as soon as possible for a visit on 01/09/2015.   Specialty:  Gastroenterology   Why:  January 27 at 4 pm   Contact information:   [address], [address] [place] [address] [phone]       Follow up with [name], MD. Schedule an appointment as soon as possible for a visit on 01/15/2015.   Specialty:  Family Medicine   Why:  2/2 at 1 PM; please bring your insurance card.   Contact information:   [address] [place] [address] [phone]       Follow up with Harrie Foreman V.   Specialty:  Surgery   Why:  [employer], next week   Contact information:   [address] [place] [address] [phone]        The results of significant diagnostics from this hospitalization (including imaging, microbiology, ancillary and laboratory) are listed below for reference.    Significant Diagnostic Studies: Ct Abdomen Pelvis Wo Contrast  12/30/2014   CLINICAL DATA:  Abdominal pain, tightness and swelling. Liver failure. Crohn's disease.  EXAM: CT ABDOMEN AND PELVIS WITHOUT CONTRAST  TECHNIQUE: Multidetector CT imaging of the abdomen and pelvis was performed following the standard protocol without IV contrast.  COMPARISON:  12/26/2014; 12/23/2014  FINDINGS: Lower chest: Micro nodular lung disease in both lung bases similar to prior. Atelectasis along the right hemidiaphragm. Slightly improved aeration in the right lower lobe. Mild improvement in the right middle lobe ground-glass opacities. Basilar nodules very up to 1.2 cm in long axis. Wall thickening in the distal esophagus with contrast medium in the distal esophagus suggesting reflux. Elevated left hemidiaphragm. Low-density blood pool, suggesting anemia. Improved right pleural effusion. Gynecomastia bilaterally.  Hepatobiliary: Hepatic cirrhosis. Cholecystectomy. Periportal edema. Edema in the porta  hepatis.  Pancreas: Unremarkable  Spleen: Stable considerable splenomegaly.  Adrenals/Urinary Tract: Cystic lesion along Morrison's pouch, 4.5 by 4.0 cm on image 32 series 2, stable, probably arising from the liver but possibly from the kidney. 2 mm right mid kidney nonobstructive calculus. Mild bilateral hydronephrosis with slight proximal hydroureter tapering out at about the level of the iliac crests. No other urinary tract calculi identified.  Stomach/Bowel: Peg tube in place. Wall thickening in the proximal stomach although some of this may be related to nondistention. Jejunal loops dilated 6.3 cm with scattered air-fluid levels. Appendix not well seen.  Vascular/Lymphatic: Mild aortoiliac atherosclerotic calcification. Prominent upper abdominal varices. Paraesophageal varices. Indistinct external iliac nodes are borderline enlarged particularly on the left.  Reproductive: Unremarkable  Other: Diffuse subcutaneous and  mesenteric edema. Retroperitoneal edema and presacral edema noted. Perihepatic ascites with fluid in the lesser sac and fluid infiltrating the omentum. Pelvic ascites just above the urinary bladder.  Musculoskeletal: Degenerative loss of articular space in the hips.  IMPRESSION: 1. Dominant changes from the exam 1 week ago are mild improvement in the right pleural effusion and right middle lobe airspace opacity; and mild reduction in ascites. 2. Mild bilateral hydronephrosis but without well-defined ureteral obstruction. 3. Common features with the exam from 1 week ago include thick walled distal esophagus with evidence of reflux; esophageal varices ; low-density blood pool compatible with anemia ; elevated left hemidiaphragm with extensive left splenomegaly ; hepatic cirrhosis ; dilated proximal loops of jejunum which are abnormal but nonspecific and could be from ileus or low-grade obstruction ; diffuse third spacing of fluid; collections of ascites including above the urinary bladder, in  Morison's pouch, and in the lesser sac; micro nodular lung disease in both lung bases ; and 2 mm right mid kidney nonobstructive calculus.   Electronically Signed   By: Herbie Baltimore M.D.   On: 12/30/2014 19:38   Ct Abdomen Pelvis Wo Contrast  12/09/2014   CLINICAL DATA:  Weakness for 4 days with low-grade fever, history of Crohn's disease, recent gastrostomy placement  EXAM: CT ABDOMEN AND PELVIS WITHOUT CONTRAST  TECHNIQUE: Multidetector CT imaging of the abdomen and pelvis was performed following the standard protocol without IV contrast.  COMPARISON:  03/07/2014, 10/15/2014  FINDINGS: Lung bases again demonstrate diffuse reticulonodular airspace disease particularly in the right lower lobe but to a lesser degree in the left lower lobe stable from the recent MRI examination.  Gallbladder has been surgically removed. The liver is stable in appearance. The spleen is enlarged in size but stable from the prior MRI examination. Stomach is significantly distended and a gastrostomy catheter is noted in place. The adrenal glands and pancreas are within normal limits. The kidneys are well visualized bilaterally and reveal some cystic change on the right. Fullness of the collecting systems is noted bilaterally although no definitive obstructive changes are seen. No ureteral calculi are seen. Mild ascites is noted.  There is some generalized distention of the small bowel without definitive obstructive change. Changes are similar to that seen on the prior exam. Changes may be related to the patient's given clinical history of Crohn's disease. This appearance is stable from the prior exam bony structures are stable in appearance. The appendix is not visualized consistent with prior cholecystectomy.  IMPRESSION: Splenomegaly likely related to a degree of portal hypertension. This is stable from previous exams.  Gastrostomy catheter in place within a distended stomach.  Mild fullness of the collecting systems bilaterally  although no true obstructive changes are seen.  Generalized prominence of the small bowel which is stable when compared with prior exams. This may be related to the patient's underlying Crohn's disease.  Mild ascites stable from the prior exam.   Electronically Signed   By: Alcide Clever M.D.   On: 12/09/2014 17:47   Ct Abdomen Pelvis W Contrast  12/23/2014   CLINICAL DATA:  Initial evaluation for RIGHT side abdomen pain evaluate G TUBE site. Hx of anal fistula, Crohn's Disease, fatty Liver, HTN, kidney stones, Hepatitis, gastrojejunostomy 2004', gall bladder surgery, appendectomy and gastrostomy tube placed 11/2014'  EXAM: CT ABDOMEN AND PELVIS WITH CONTRAST  TECHNIQUE: Multidetector CT imaging of the abdomen and pelvis was performed using the standard protocol following bolus administration of intravenous contrast.  CONTRAST:   OMNIPAQUE IOHEXOL 300 MG/ML  SOLN  COMPARISON:  12/09/2014  FINDINGS: There are no focal hepatic abnormalities. Severe splenomegaly similar to prior study. Percutaneous gastrostomy tube enters the stomach. Status post cholecystectomy. There is fluid and inflammatory change surrounding the stomach, seen in the lesser sac and anteriorly in the omentum. These findings are new when compared to prior study.  The pancreas appears normal. There is a small focus of air within mildly dilated intrapancreatic common bile duct likely the result of prior sphincterotomy. Prior gastrojejunostomy noted. Adrenal glands normal. Left kidney normal. There is a cystic lesion adjacent to the upper pole the right kidney measuring 43 x 46 x 63 mm. It appears separate from both the adrenal gland and kidney.  There is very limited evaluation of bowel as there is no oral contrast, and bowel loops are rendered relatively indistinct given rather diffuse mesenteric edema. There is no bowel dilatation to suggest obstruction. There is no evidence of pneumoperitoneum. There are loops of small bowel showing air-fluid  levels suggesting ileus. There is a pocket of ascites immediately cranial to the bladder. The bladder is otherwise normal. Reproductive organs are normal.  There is significant diffuse increased attenuation throughout the subcutaneous soft tissues consistent with anasarca. There is a small right pleural effusion which is new from prior study. The degree of anasarca has significantly increased in severity.  There is micronodular infiltrate throughout the right lower lobe and much of the right middle lobe. This was present previously and it is very similar slightly worse in the right middle lobe. Multiple nodular densities are also seen throughout the left lung, similar to prior study.  There are no acute osseous abnormalities. There is a 15 mm needle like opacity within the rectum. This was not present on the prior study.  IMPRESSION: 1. When compared to the most recent prior study there is severe increased in the severity of anasarca, and there is a new small right pleural effusion. 2. Significant interval development of fluid surrounding the stomach in the lesser sac and in the omentum with severe diffuse mesenteric edema. The findings suggest the possibility of pancreatitis although the pancreas is normal in appearance and this may simply reflect interval development of ascites related to fluid overload. 3. Nodular liver contour with masses splenomegaly suggesting cirrhosis. Spleen is 21 cm in span. 4. 6 cm cystic lesion adjacent to the upper pole the right kidney. It does not appear to arise from either the right kidney or the adrenal gland. It may represent a loop chronic loculation of ascites or a duplication cyst. A chronic pseudocyst from prior pancreatitis could have this appearance. 5. Nonobstructive gas pattern with limited evaluation of bowel as described above. There is a 15 mm needle like opacity projecting over the posterior mid rectum. This is not seen on the prior study. Uncertain whether this  represents interval postsurgical change or an ingested foreign object. 6. Severe bilateral nodular infiltrates at the lung bases with developing acinar opacity is. Possibilities include worsening bronchopneumonia. Miliary metastatic disease is another consideration.   Electronically Signed   By: Esperanza Heir M.D.   On: 12/23/2014 18:15   US Abdomen Limited  12/16/2014   CLINICAL DATA:  Evaluate for ascites.  EXAM: LIMITED ABDOMEN ULTRASOUND FOR ASCITES  TECHNIQUE: Limited ultrasound survey for ascites was performed in all four abdominal quadrants.  COMPARISON:  12/09/2014 CT  FINDINGS: Focused ultrasound of the abdomen demonstrates no evidence of ascites.  IMPRESSION: No significant ascites.   Electronically Signed  By: Jeronimo Greaves M.D.   On: 12/16/2014 10:03   US Venous Img Lower Unilateral Left  12/30/2014   CLINICAL DATA:  LEFT leg edema for 3 weeks, pain  EXAM: LEFT LOWER EXTREMITY VENOUS DOPPLER ULTRASOUND  TECHNIQUE: Gray-scale sonography with graded compression, as well as color Doppler and duplex ultrasound were performed to evaluate the lower extremity deep venous systems from the level of the common femoral vein and including the common femoral, femoral, profunda femoral, popliteal and calf veins including the posterior tibial, peroneal and gastrocnemius veins when visible. The superficial great saphenous vein was also interrogated. Spectral Doppler was utilized to evaluate flow at rest and with distal augmentation maneuvers in the common femoral, femoral and popliteal veins.  COMPARISON:  03/08/2014  FINDINGS: Contralateral Common Femoral Vein: Respiratory phasicity is normal and symmetric with the symptomatic side. No evidence of thrombus. Normal compressibility.  Common Femoral Vein: No evidence of thrombus. Normal compressibility, respiratory phasicity and response to augmentation.  Saphenofemoral Junction: No evidence of thrombus. Normal compressibility and flow on color Doppler imaging.   Profunda Femoral Vein: No evidence of thrombus. Normal compressibility and flow on color Doppler imaging.  Femoral Vein: No evidence of thrombus. Normal compressibility, respiratory phasicity and response to augmentation.  Popliteal Vein: No evidence of thrombus. Normal compressibility, respiratory phasicity and response to augmentation.  Calf Veins: No evidence of thrombus. Normal compressibility and flow on color Doppler imaging.  Superficial Great Saphenous Vein: No evidence of thrombus. Normal compressibility and flow on color Doppler imaging.  Venous Reflux:  None.  Other Findings:  Scattered subcutaneous edema LEFT calf.  IMPRESSION: No evidence of deep venous thrombosis in the LEFT lower extremity.  Subcutaneous edema LEFT calf.   Electronically Signed   By: Ulyses Southward M.D.   On: 12/30/2014 18:40   Ir Replc Gastro/colonic Tube Percut W/fluoro  12/11/2014   CLINICAL DATA:  Crohn's disease with small bowel obstruction. Percutaneous gastrostomy tube placed 3 weeks ago for decompression. Recurrent occlusions of the 12 French catheter. Exchange and up sizing is requested.  EXAM: GASTROSTOMY CATHETER REPLACEMENT  FLUOROSCOPY TIME:  2 min 18 seconds  TECHNIQUE: The procedure, risks, benefits, and alternatives were explained to the patient. Questions regarding the procedure were encouraged and answered. The patient understands and consents to the procedure. A 5 French angiographic catheter was placed as orogastric tube. The upper abdomen was prepped with Betadine, draped in usual sterile fashion, and infiltrated locally with 1% lidocaine.  Intravenous Fentanyl and Versed were administered as conscious sedation during continuous cardiorespiratory monitoring by the radiology RN, with a total moderate sedation time of less than 30 minutes.  The external portion of the previously placed 12 French gastrostomy catheter was cut. The 6 French snare sheath could not be advanced beyond the tip of the pigtail catheter  within the lumen of the stomach. An Amplatz guidewire was advanced and the catheter exchanged for a 12 French peel-away sheath. Through this, the snare device was advanced and used to snare a guidewire passed through the orogastric tube. This was withdrawn, and the snare attached to the 20 French pull-through gastrostomy tube, which was advanced antegrade, positioned with the internal bumper securing the anterior gastric wall to the anterior abdominal wall. Small contrast injection confirms appropriate positioning. The external bumper was applied and the catheter was flushed. No immediate complication.  IMPRESSION: 1. Technically successful gastrostomy exchange under fluoroscopy for a 20 French pull-through device, OK for routine use.   Electronically Signed   By: Reuel Boom  Deanne Coffer M.D.   On: 12/11/2014 16:14   Dg Chest Port 1 View  12/21/2014   CLINICAL DATA:  Infection.  Fever.  EXAM: PORTABLE CHEST - 1 VIEW  COMPARISON:  December 19, 2014.  FINDINGS: The heart size and mediastinal contours are within normal limits. Hypoinflation of the lungs is noted. Mild left basilar subsegmental atelectasis is noted. Increased opacity is seen in the right lung base suggesting subsegmental atelectasis or possibly pneumonia. Stable elevated left hemidiaphragm. Right-sided PICC line is unchanged in position with distal tip overlying expected position of the SVC. No pneumothorax or pleural effusion is noted. The visualized skeletal structures are unremarkable.  IMPRESSION: Mild left basilar subsegmental atelectasis. Increased right basilar opacity is noted concerning for subsegmental atelectasis or possibly developing pneumonia. Followup radiographs are recommended.   Electronically Signed   By: Roque Lias M.D.   On: 12/21/2014 09:59   Dg Chest Portable 1 View  12/09/2014   CLINICAL DATA:  Decreased oral intake, generalized weakness  EXAM: PORTABLE CHEST - 1 VIEW  COMPARISON:  08/21/2014  FINDINGS: Cardiac shadow is stable.  The left lung is clear. The right lung is well aerated but demonstrates some patchy infiltrative changes in the right lung base which may be related aspiration pneumonia. Clinical correlation is recommended.  IMPRESSION: New right basilar infiltrative change. This may represent aspiration pneumonia. Clinical correlation is recommended.   Electronically Signed   By: Alcide Clever M.D.   On: 12/09/2014 14:58   Dg Abd Acute W/chest  12/26/2014   CLINICAL DATA:  Small bowel obstruction. Right side abdominal pain. Former smoker, pneumonia.  EXAM: ACUTE ABDOMEN SERIES (ABDOMEN 2 VIEW & CHEST 1 VIEW)  COMPARISON:  CT 12/23/2014.  FINDINGS: Air-fluid levels are noted in the mid abdomen. Mildly dilated mid abdominal small bowel loops on the supine view. Findings compatible with small bowel obstruction. Relative paucity of gas throughout the colon. No free air. Gastrostomy tube projects over the distal stomach. Prior cholecystectomy. No organomegaly or suspicious calcification.  Heart is normal size. Patchy airspace disease in the right lung base. Left lung is clear. No effusions. No acute bony abnormality. Right PICC line is in place with the tip in the lower SVC, stable.  IMPRESSION: Small bowel obstruction pattern, similar to prior CT.  Right basilar opacity.  Cannot exclude pneumonia.   Electronically Signed   By: Charlett Nose M.D.   On: 12/26/2014 11:52   Dg Abd Acute W/chest  12/19/2014   CLINICAL DATA:  Right-sided abdominal pain.  Crohn's disease.  EXAM: ACUTE ABDOMEN SERIES (ABDOMEN 2 VIEW & CHEST 1 VIEW)  COMPARISON:  Radiographs 12/15/2014, CT 12/09/2014  FINDINGS: Tip of the right upper extremity PICC in the SVC. Improving lung aeration with decreasing reticulonodular opacities at the right lung base. Decreasing left basilar opacity. Gastrostomy tube in the upper abdomen. Linear densities in the central abdomen, similar to prior exam, may be external to the patient or in the skin. Paucity of intra-abdominal  bowel gas is again seen. No definite free intra-abdominal air, air in the left upper quadrant of the abdomen is likely within the stomach. Right upper quadrant surgical clips from cholecystectomy.  IMPRESSION: Paucity of intra-abdominal bowel gas, similar to prior exam. No evidence of free intra-abdominal air. Gastrostomy tube remains in place.   Electronically Signed   By: Rubye Oaks M.D.   On: 12/19/2014 20:39   Dg Abd Acute W/chest  12/15/2014   CLINICAL DATA:  Abdominal pain.  Chest pain.  EXAM: ACUTE  ABDOMEN SERIES (ABDOMEN 2 VIEW & CHEST 1 VIEW)  COMPARISON:  CT abdomen pelvis 12/09/2014. Chest radiograph 12/09/2014.  FINDINGS: Frontal view of the chest demonstrates right-sided PICC line which terminates at the low SVC.  Patient rotated right. Normal heart size. No pleural effusion or pneumothorax. Similar right and new left base airspace disease.  Abdominal films demonstrate fluid level within the stomach, gastrostomy tube in place. There may be minimal small bowel air-fluid levels within the left side of the abdomen as well. No free intraperitoneal air.  Paucity of bowel gas on supine imaging. Surgical clips in the right upper quadrant. Linear radiopaque object within the left-sided abdomen is without intra abdominal correlate on the recent CT.  IMPRESSION: 1. Similar right and new left base airspace disease, suspicious for infection or aspiration. 2. Nonspecific bowel gas pattern. Paucity of bowel gas with suggestion of minimal small bowel air-fluid levels. Cannot exclude ileus or mild small bowel obstruction. Linear object projecting over the left side of the abdomen. No definite intra-abdominal correlate on recent CT. This may be external to the patient.   Electronically Signed   By: Jeronimo Greaves M.D.   On: 12/15/2014 15:10    Microbiology: Recent Results (from the past 240 hour(s))  Wound culture     Status: None   Collection Time: 12/24/14  2:30 PM  Result Value Ref Range Status    Specimen Description WOUND GASTROSTOMY TUBE SITE  Final   Special Requests NONE  Final   Gram Stain   Final    RARE WBC PRESENT, PREDOMINANTLY MONONUCLEAR NO SQUAMOUS EPITHELIAL CELLS SEEN NO ORGANISMS SEEN Performed at [facility]    Culture   Final    RARE PSEUDOMONAS AERUGINOSA Performed at [facility]    Report Status 12/28/2014 FINAL  Final   Organism ID, Bacteria PSEUDOMONAS AERUGINOSA  Final      Susceptibility   Pseudomonas aeruginosa - MIC*    CEFEPIME 8 SENSITIVE Sensitive     CEFTAZIDIME 4 SENSITIVE Sensitive     CIPROFLOXACIN <=0.25 SENSITIVE Sensitive     GENTAMICIN 8 INTERMEDIATE Intermediate     IMIPENEM 1 SENSITIVE Sensitive     PIP/TAZO 8 SENSITIVE Sensitive     TOBRAMYCIN <=1 SENSITIVE Sensitive     * RARE PSEUDOMONAS AERUGINOSA  Clostridium Difficile by PCR     Status: None   Collection Time: 12/26/14 10:16 PM  Result Value Ref Range Status   C difficile by pcr NEGATIVE NEGATIVE Final  Wound culture     Status: None (Preliminary result)   Collection Time: 12/31/14 12:54 AM  Result Value Ref Range Status   Specimen Description ABDOMEN  Final   Special Requests Immunocompromised  Final   Gram Stain   Final    RARE WBC PRESENT, PREDOMINANTLY MONONUCLEAR NO SQUAMOUS EPITHELIAL CELLS SEEN RARE GRAM POSITIVE COCCI IN PAIRS IN CLUSTERS FEW YEAST Performed at [facility]    Culture   Final    Culture reincubated for better growth Performed at [facility]    Report Status PENDING  Incomplete     Labs: Basic Metabolic Panel:  Recent Labs Lab 12/27/14 0538 12/29/14 0652 12/30/14 0627 12/31/14 0644 01/01/15 0602  NA 137 134* 137 133* 133*  K 3.5 4.4 4.1 3.8 3.3*  CL 98 98 97 94* 92*  CO2 34* 32 33* 32 34*  GLUCOSE 121* 101* 99 137* 133*  BUN 12 13 14 15 16   CREATININE 0.66 0.73 0.77 0.82 0.79  CALCIUM  7.6* 8.1* 8.5 8.3* 8.0*  MG 1.5  --   --  1.7  --   PHOS 2.9  --   --  4.9*  --    Liver Function  Tests:  Recent Labs Lab 12/27/14 0538 12/31/14 0644  AST 27 48*  ALT 34 45  ALKPHOS 195* 256*  BILITOT 0.7 1.2  PROT 5.2* 5.1*  ALBUMIN 2.8* 3.0*   No results for input(s): LIPASE, AMYLASE in the last 168 hours. No results for input(s): AMMONIA in the last 168 hours. CBC:  Recent Labs Lab 12/27/14 0538 12/31/14 0644 01/01/15 0602  WBC 5.3 3.9* 6.0  NEUTROABS  --  3.1  --   HGB 8.3* 7.1* 8.0*  HCT 27.9* 23.4* 25.8*  MCV 86.9 85.7 84.6  PLT 103* 87* 94*   Cardiac Enzymes: No results for input(s): CKTOTAL, CKMB, CKMBINDEX, TROPONINI in the last 168 hours. BNP: BNP (last 3 results) No results for input(s): PROBNP in the last 8760 hours. CBG:  Recent Labs Lab 12/31/14 0736 12/31/14 1604 01/01/15 0013 01/01/15 0752 01/01/15 1122  GLUCAP 105* 122* 207* 145* 110*       Signed:  MEMON,JEHANZEB  [facility] 01/01/2015, 9:04 PM

## 2015-01-01 NOTE — Progress Notes (Signed)
NUTRITION FOLLOW UP  Intervention:   -TPN per pharmacy -RD to follow for diet advancement  Nutrition Dx:   Inadequate oral intake related to altered GI function as evidenced by NPO; ongoing   Goal:   Pt will meet >90% of estimated nutritional needs; progressing via TPN  Monitor:  TPN tolerance, transition to PO diet/enteral feedings (if feasible), labs, weight changes, I/O's  Assessment:   David [name] is a 39 y.o. male with a history of Crohn disease with fistula, Stage III fatty liver fibrosis and chronic liver disease, anemia, hypertension, failure to thrive, on chronic steroids. Patient underwent exploratory laparotomy at [facility] on 11/05/2014 with plans of lysis adhesions and possible bowel resection, but surgery was never able to completely enter the abdomen due to frozen abdomen. Patient has a gastrostomy tube placed percutaneously. Over the past several weeks, the patient's abdominal pain has been increasing.    GI has been in contact with [facility] for consultation, however, pt was declined transfer due to pt being recommended for transplant on an outpatient basis.   Pt remains on IV antibiotics due to possible infection to G-tube site- G-tube fluid has been sent for cultures.  Pt has been upgraded to a full liquid diet and is tolerating well- PO: 50-75%. No complaints of abdominal pain. However, pt is on a 800 ml fluid restriction due to concern with fluid status.  He remains on TPN- per pharmacy, rate decreased per GI request due to concern of fluid overload and edema. Clinimix E 5/15 now running at 60 ml/hr with daily lipids, trace elements and MVI which provides 1502 kcals (72% of minimum estimated kcal needs) and  72 grams protein (75% of minimum estimated protein needs).  He continues with good output for G-tube (550 ml in the past 24 hours per GI notes). Wt has been stable since admission. He remains fluid overloaded; he is continues to receive IV lasix. Labs reviewed.  Na: 133, K: 3.3, Cl: 92, CO2: 34, Calcium: 8.0, Phos: 4.9 Glucose: 112. CBGS: 110-207. Mg WDL. Pt receiving sliding scale insulin with CBG checks every 6 hours.   Height: Ht Readings from Last 1 Encounters:  12/15/14 5\' 11"  (1.803 m)    Weight Status:   Wt Readings from Last 1 Encounters:  01/01/15 178 lb (80.74 kg)   12/20/14 186 lb 8 oz (84.596 kg)   12/16/14 189 lb 2.5 oz (85.8 kg)   Re-estimated needs:  Kcal: 2100-2300 Protein: 96-106 grams Fluid: 2.1-2.3 L  Skin: mid abdominal incision, 3+ pitting edema involving both lower extremities  Diet Order: Diet full liquid TPN (CLINIMIX-E) Adult .TPN (CLINIMIX-E) Adult   Intake/Output Summary (Last 24 hours) at 01/01/15 1428 Last data filed at 01/01/15 1353  Gross per 24 hour  Intake    915 ml  Output   4476 ml  Net  -3561 ml    Last BM: 01/01/15   Labs:   Recent Labs Lab 12/27/14 0538  12/30/14 0627 12/31/14 0644 01/01/15 0602  NA 137  < > 137 133* 133*  K 3.5  < > 4.1 3.8 3.3*  CL 98  < > 97 94* 92*  CO2 34*  < > 33* 32 34*  BUN 12  < > 14 15 16   CREATININE 0.66  < > 0.77 0.82 0.79  CALCIUM 7.6*  < > 8.5 8.3* 8.0*  MG 1.5  --   --  1.7  --   PHOS 2.9  --   --  4.9*  --  GLUCOSE 121*  < > 99 137* 133*  < > = values in this interval not displayed.  CBG (last 3)   Recent Labs  01/01/15 0013 01/01/15 0752 01/01/15 1122  GLUCAP 207* 145* 110*    Scheduled Meds: . acidophilus  1 capsule Oral Daily  . calcium-vitamin D  2 tablet Oral BID  . ciprofloxacin  500 mg Oral BID  . furosemide  80 mg Intravenous BID  . hydrocortisone sod succinate (SOLU-CORTEF) inj  50 mg Intravenous Daily  . insulin aspart  0-15 Units Subcutaneous 4 times per day  . metolazone  5 mg Oral BID  . mupirocin cream   Topical BID  . nystatin-triamcinolone   Topical BID  . oxyCODONE  25 mg Oral QID  . pantoprazole (PROTONIX) IV  40 mg Intravenous BID  . potassium chloride  40 mEq Oral TID  . sodium chloride  10 mL  Intravenous Q12H  . sodium chloride  3 mL Intravenous Q12H  . spironolactone  25 mg Oral BID  . zolpidem  10 mg Oral QHS    Continuous Infusions: . Marland KitchenTPN (CLINIMIX-E) Adult    . Marland KitchenTPN (CLINIMIX-E) Adult 60 mL/hr at 12/31/14 1907   And  . fat emulsion 250 mL (12/31/14 1907)    Tavaras Goody A. Mayford Knife, RD, LDN, CDE Pager: [phone] After hours Pager: [phone]

## 2015-01-01 NOTE — Progress Notes (Signed)
Discharge instructions reviewed with patient. Understanding verbalized. PICC line flushed. Patients home health has been set up by CM. Patient awaiting ride for discharge home.

## 2015-01-01 NOTE — Progress Notes (Signed)
PARENTERAL NUTRITION CONSULT NOTE  Pharmacy Consult for TPN Indication: prolonged ileus  Allergies  Allergen Reactions  . Remicade [Infliximab]     Dr Karilyn Cota states previous respiratory arrest not related to remicade. Dr Karilyn Cota states remicade not a drug related allergy. 07-07-2013 at 1025 rapid response called to PACU- Patient difficulty breathing after infusion of Remicade infusing. Do NOT Give Remicade!  . Fentanyl And Related Itching    Swelling, Redness  . Alfentanil Itching    Swelling, Redness  . Wellbutrin [Bupropion] Nausea And Vomiting  . Morphine And Related Hives   Patient Measurements: Height:  (180.3 cm) Weight: 179 lb 3.7 oz (81.3 kg) IBW/kg (Calculated) : 75.3  Usual body weight ~ 100kg  Vital Signs: Temp: 98.6 F (37 C) (01/19 0520) Temp Source: Oral (01/19 0520) BP: 106/66 mmHg (01/19 0520) Pulse Rate: 82 (01/19 0520) Intake/Output from previous day: 01/18 0701 - 01/19 0700 In: 1515 [P.O.:1080; I.V.:100; Blood:335] Out: 2775 [Urine:1575; Drains:1200] Intake/Output from this shift:    Labs:  Recent Labs  12/31/14 0644 01/01/15 0602  WBC 3.9* 6.0  HGB 7.1* 8.0*  HCT 23.4* 25.8*  PLT 87* 94*     Recent Labs  12/30/14 0627 12/31/14 0644 01/01/15 0602  NA 137 133* 133*  K 4.1 3.8 3.3*  CL 97 94* 92*  CO2 33* 32 34*  GLUCOSE 99 137* 133*  BUN CREATININE 0.77 0.82 0.79  CALCIUM 8.5 8.3* 8.0*  MG  --  1.7  --   PHOS  --  4.9*  --   PROT  --  5.1*  --   ALBUMIN  --  3.0*  --   AST  --  48*  --   ALT  --  45  --   ALKPHOS  --  256*  --   BILITOT  --  1.2  --   PREALBUMIN  --  13.9*  --   TRIG  --  41  --    Estimated Creatinine Clearance: 133.3 mL/min (by C-G formula based on Cr of 0.79).    Recent Labs  12/31/14 1604 01/01/15 0013 01/01/15 0752  GLUCAP 122* 207* 145*   Medical History: Past Medical History  Diagnosis Date  . 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 Crohn's disease with fistula 05/04/2011    both large  and small intestinges/notes 11/15/2012  . Hepatomegaly 05/04/2011  . Fatty liver 05/04/2011    "stage III fatty liver fibrosis" (11/15/2012)  . GERD (gastroesophageal reflux disease)   . Chronic liver disease     /notes 11/15/2012  . Pericarditis     [name] 11/15/2012  . Hypertension   . Pneumonia 1977  . Shortness of breath     "all the time right now" (11/15/2012)  . History of blood transfusion 2004  . History of stomach ulcers   . Duodenal ulcer   . Depression   . Kidney stones     bilaterally/notes 11/15/2012  . Hepatic fibrosis     [name] 11/15/2012  . Anemia   . Pericardial effusion 10/29/2012    moderate to large/notes 11/15/2012  . Anxiety   . Hepatitis   . Crohn's disease   . ED (erectile dysfunction)   . 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 States that he just been seen at [facility] 12/05/14 to change out his PEG tube to a larger size but was not done due to low BP.  Was sent home to be with family over holidays. Over that period of time he progressively got weaker and weaker and felt more ill or nauseous and was unable to keep anything down.  He was hospitalized at [facility] 12/27-31 and discharged with TPN but was readmitted due to choking and possible aspiration after eating at home.   Evaluation at [facility] for liver/colon transplant is pending.  Plan transfer to [facility] when bed available.  K+ , Na, and Cl slightly below goal range today. On Lasix & oral potassium replacement, spironolactone and metolazone.  Corrected Ca 9.2 CBGs mildly increased on  Solu-Cortef.  CBG>200 x1 overnight.  Correction insulin was stopped, but will re-order.  24hr I/O appears incomplete. Will continue at decreased rate as per discussion with Dr Karilyn Cota to limit fluid intake/weight gain.  Unable to concentrate TPN due to limitations with premixed Clinimix.  Estimated Nutritional Needs: Kcal: 2100-2300 Protein: 96-106 grams Fluid: 2.1-2.3 L  Plan:   Continue TPN with Clinimix E 5/15 at 5ml/hr   Provide lipids (MWF due to shortage), trace elements (daily), and MVI (daily) with TPN  Monitor CBGs q6h and administer correction insulin if needed.  Monitor lytes, triglycerides, glucose tolerance, renal fxn, and fluid status  Give additional dose potassium x1 today  Strict I/Os ordered.  F/U fluid status with Dr Karilyn Cota prior to any rate increases.  Elson Clan 01/01/2015,8:43 AM

## 2015-01-01 NOTE — Progress Notes (Signed)
Dressing around Gtube changed. Site cleaned and creams applied. Patient tolerated well.

## 2015-01-03 ENCOUNTER — Other Ambulatory Visit (INDEPENDENT_AMBULATORY_CARE_PROVIDER_SITE_OTHER): Payer: Self-pay | Admitting: *Deleted

## 2015-01-03 LAB — WOUND CULTURE

## 2015-01-03 MED ORDER — OXYCODONE HCL 20 MG/ML PO CONC: 30.0000 mg | Freq: Four times a day (QID) | ORAL | Status: DC

## 2015-01-03 NOTE — Telephone Encounter (Signed)
Patient states that he has enough to last until Saturday (01/05/15) States that it was written for 1.5-2 mL's every 4 hours. Kipper states that he has to take the 2 mL's to relieve pain. He says that only 30 mL's was dispensed when d/c from hospital.

## 2015-01-05 DIAGNOSIS — I10 Essential (primary) hypertension: Secondary | ICD-10-CM | POA: Diagnosis not present

## 2015-01-05 DIAGNOSIS — K769 Liver disease, unspecified: Secondary | ICD-10-CM | POA: Diagnosis not present

## 2015-01-05 DIAGNOSIS — K50813 Crohn's disease of both small and large intestine with fistula: Secondary | ICD-10-CM | POA: Diagnosis not present

## 2015-01-05 DIAGNOSIS — K269 Duodenal ulcer, unspecified as acute or chronic, without hemorrhage or perforation: Secondary | ICD-10-CM | POA: Diagnosis not present

## 2015-01-05 DIAGNOSIS — K603 Anal fistula: Secondary | ICD-10-CM | POA: Diagnosis not present

## 2015-01-06 DIAGNOSIS — K7689 Other specified diseases of liver: Secondary | ICD-10-CM | POA: Diagnosis not present

## 2015-01-06 DIAGNOSIS — K5090 Crohn's disease, unspecified, without complications: Secondary | ICD-10-CM | POA: Diagnosis not present

## 2015-01-08 DIAGNOSIS — K269 Duodenal ulcer, unspecified as acute or chronic, without hemorrhage or perforation: Secondary | ICD-10-CM | POA: Diagnosis not present

## 2015-01-08 DIAGNOSIS — J9600 Acute respiratory failure, unspecified whether with hypoxia or hypercapnia: Secondary | ICD-10-CM | POA: Diagnosis not present

## 2015-01-09 ENCOUNTER — Ambulatory Visit (INDEPENDENT_AMBULATORY_CARE_PROVIDER_SITE_OTHER): Payer: Medicare Other | Admitting: Internal Medicine

## 2015-01-09 DIAGNOSIS — K7031 Alcoholic cirrhosis of liver with ascites: Secondary | ICD-10-CM | POA: Diagnosis not present

## 2015-01-10 ENCOUNTER — Telehealth (INDEPENDENT_AMBULATORY_CARE_PROVIDER_SITE_OTHER): Payer: Self-pay | Admitting: *Deleted

## 2015-01-10 NOTE — Telephone Encounter (Signed)
[name],[name], called. She states that the liquid medication is not helping David [name]'s pain. It is not taking away his pain. He is in a hospital bed at home now. She ask if there is a pill that for pain that he could take in addition to the liquid? It will be about 2 months before he can see the pain management doctor at [facility]. David [name] says that she is [name] keeping his pill form medication before and would continue if a new one was given. He saw transplant physician 01/09/14 at [facility] and she states this all went good and that [name] would share more when he is seen here in the office. Her call back number is [phone]. Currently taking - Oxycodone 20 mg/mL - 30 - 40 mg QID. 286mL's Last written on :  01/03/2015

## 2015-01-11 NOTE — Telephone Encounter (Signed)
Per Dr.Rehman if patient is taking 30 mg he may go up to 40 mg four times a day. If he is taking 40 mg he may go up to 50 mg by mouth four times a day. This would equal 2.5 mLs.  Patient called and he states that he is currently taking 40 mg four times daily. He was given Dr.Rehman's recommendation. He mentions something about [facility] was the only Pharmacy that had this medication and that he had to forfeit some of his prescription. That [facility] could be called to verify.  Then patient says that he's not sure of he can get this medication  Due to an insurance issue.

## 2015-01-12 DIAGNOSIS — K269 Duodenal ulcer, unspecified as acute or chronic, without hemorrhage or perforation: Secondary | ICD-10-CM | POA: Diagnosis not present

## 2015-01-12 DIAGNOSIS — K603 Anal fistula: Secondary | ICD-10-CM | POA: Diagnosis not present

## 2015-01-12 DIAGNOSIS — K769 Liver disease, unspecified: Secondary | ICD-10-CM | POA: Diagnosis not present

## 2015-01-12 DIAGNOSIS — I10 Essential (primary) hypertension: Secondary | ICD-10-CM | POA: Diagnosis not present

## 2015-01-12 DIAGNOSIS — K50813 Crohn's disease of both small and large intestine with fistula: Secondary | ICD-10-CM | POA: Diagnosis not present

## 2015-01-13 NOTE — Telephone Encounter (Signed)
See Ms. Todd's note for recommendations

## 2015-01-14 ENCOUNTER — Telehealth (INDEPENDENT_AMBULATORY_CARE_PROVIDER_SITE_OTHER): Payer: Self-pay | Admitting: *Deleted

## 2015-01-14 ENCOUNTER — Other Ambulatory Visit (INDEPENDENT_AMBULATORY_CARE_PROVIDER_SITE_OTHER): Payer: Self-pay | Admitting: *Deleted

## 2015-01-14 DIAGNOSIS — K50012 Crohn's disease of small intestine with intestinal obstruction: Secondary | ICD-10-CM | POA: Diagnosis not present

## 2015-01-14 DIAGNOSIS — K766 Portal hypertension: Secondary | ICD-10-CM | POA: Diagnosis not present

## 2015-01-14 DIAGNOSIS — K7460 Unspecified cirrhosis of liver: Secondary | ICD-10-CM | POA: Diagnosis not present

## 2015-01-14 MED ORDER — OXYCODONE HCL 20 MG/ML PO CONC: 50.0000 mg | Freq: Four times a day (QID) | ORAL | Status: DC

## 2015-01-14 NOTE — Telephone Encounter (Signed)
Patient left a message that he was out of pain medication,as it was increased  Friday. His mother left another message on voicemail, she states that David [name] is sick , had run and is still running fevers. Per David [name], he feels that he has pneumonia again , temps are between 99 - 101 , and he is short of breathe. It is 100 right now per patient.

## 2015-01-14 NOTE — Telephone Encounter (Signed)
This should be a refill request.

## 2015-01-14 NOTE — Telephone Encounter (Signed)
Discussed patient's condition with Dr. [name] who has seen patient the past for pain control. Patient advised not to take any other pain medication. If patient is agreeable will refer him back to Dr. [name] since his visit at [facility] pain clinic is not until end of next month.Marland Kitchen

## 2015-01-15 ENCOUNTER — Ambulatory Visit (INDEPENDENT_AMBULATORY_CARE_PROVIDER_SITE_OTHER): Payer: Medicare Other | Admitting: Family Medicine

## 2015-01-15 ENCOUNTER — Emergency Department (HOSPITAL_COMMUNITY): Payer: Medicare Other

## 2015-01-15 ENCOUNTER — Encounter: Payer: Self-pay | Admitting: Family Medicine

## 2015-01-15 ENCOUNTER — Encounter (HOSPITAL_COMMUNITY): Payer: Self-pay | Admitting: Emergency Medicine

## 2015-01-15 ENCOUNTER — Inpatient Hospital Stay (HOSPITAL_COMMUNITY)
Admission: EM | Admit: 2015-01-15 | Discharge: 2015-01-31 | DRG: 870 | Disposition: A | Discharge status: Home Health | Payer: Medicare Other | Attending: Internal Medicine | Admitting: Internal Medicine

## 2015-01-15 ENCOUNTER — Telehealth (INDEPENDENT_AMBULATORY_CARE_PROVIDER_SITE_OTHER): Payer: Self-pay | Admitting: *Deleted

## 2015-01-15 VITALS — BP 102/68 | Temp 97.8°F | Ht 71.0 in | Wt 192.0 lb

## 2015-01-15 DIAGNOSIS — K50819 Crohn's disease of both small and large intestine with unspecified complications: Secondary | ICD-10-CM

## 2015-01-15 DIAGNOSIS — N19 Unspecified kidney failure: Secondary | ICD-10-CM | POA: Diagnosis not present

## 2015-01-15 DIAGNOSIS — K5090 Crohn's disease, unspecified, without complications: Secondary | ICD-10-CM | POA: Diagnosis not present

## 2015-01-15 DIAGNOSIS — Z79899 Other long term (current) drug therapy: Secondary | ICD-10-CM

## 2015-01-15 DIAGNOSIS — I10 Essential (primary) hypertension: Secondary | ICD-10-CM | POA: Diagnosis not present

## 2015-01-15 DIAGNOSIS — D696 Thrombocytopenia, unspecified: Secondary | ICD-10-CM | POA: Diagnosis present

## 2015-01-15 DIAGNOSIS — T827XXA Infection and inflammatory reaction due to other cardiac and vascular devices, implants and grafts, initial encounter: Secondary | ICD-10-CM | POA: Diagnosis not present

## 2015-01-15 DIAGNOSIS — N179 Acute kidney failure, unspecified: Secondary | ICD-10-CM | POA: Diagnosis not present

## 2015-01-15 DIAGNOSIS — R6521 Severe sepsis with septic shock: Secondary | ICD-10-CM | POA: Diagnosis present

## 2015-01-15 DIAGNOSIS — K3184 Gastroparesis: Secondary | ICD-10-CM | POA: Diagnosis present

## 2015-01-15 DIAGNOSIS — G9341 Metabolic encephalopathy: Secondary | ICD-10-CM | POA: Diagnosis not present

## 2015-01-15 DIAGNOSIS — Z978 Presence of other specified devices: Secondary | ICD-10-CM

## 2015-01-15 DIAGNOSIS — K7460 Unspecified cirrhosis of liver: Secondary | ICD-10-CM | POA: Diagnosis present

## 2015-01-15 DIAGNOSIS — J189 Pneumonia, unspecified organism: Secondary | ICD-10-CM

## 2015-01-15 DIAGNOSIS — A419 Sepsis, unspecified organism: Secondary | ICD-10-CM

## 2015-01-15 DIAGNOSIS — Y838 Other surgical procedures as the cause of abnormal reaction of the patient, or of later complication, without mention of misadventure at the time of the procedure: Secondary | ICD-10-CM | POA: Diagnosis present

## 2015-01-15 DIAGNOSIS — K632 Fistula of intestine: Secondary | ICD-10-CM | POA: Diagnosis not present

## 2015-01-15 DIAGNOSIS — K50813 Crohn's disease of both small and large intestine with fistula: Secondary | ICD-10-CM | POA: Diagnosis present

## 2015-01-15 DIAGNOSIS — H1131 Conjunctival hemorrhage, right eye: Secondary | ICD-10-CM | POA: Diagnosis not present

## 2015-01-15 DIAGNOSIS — J690 Pneumonitis due to inhalation of food and vomit: Secondary | ICD-10-CM | POA: Diagnosis present

## 2015-01-15 DIAGNOSIS — J9690 Respiratory failure, unspecified, unspecified whether with hypoxia or hypercapnia: Secondary | ICD-10-CM

## 2015-01-15 DIAGNOSIS — B379 Candidiasis, unspecified: Secondary | ICD-10-CM | POA: Diagnosis not present

## 2015-01-15 DIAGNOSIS — E872 Acidosis: Secondary | ICD-10-CM | POA: Diagnosis not present

## 2015-01-15 DIAGNOSIS — B370 Candidal stomatitis: Secondary | ICD-10-CM | POA: Diagnosis not present

## 2015-01-15 DIAGNOSIS — J9691 Respiratory failure, unspecified with hypoxia: Secondary | ICD-10-CM | POA: Diagnosis not present

## 2015-01-15 DIAGNOSIS — H3563 Retinal hemorrhage, bilateral: Secondary | ICD-10-CM | POA: Diagnosis not present

## 2015-01-15 DIAGNOSIS — J9601 Acute respiratory failure with hypoxia: Secondary | ICD-10-CM | POA: Diagnosis not present

## 2015-01-15 DIAGNOSIS — R0902 Hypoxemia: Secondary | ICD-10-CM

## 2015-01-15 DIAGNOSIS — Z87891 Personal history of nicotine dependence: Secondary | ICD-10-CM

## 2015-01-15 DIAGNOSIS — K219 Gastro-esophageal reflux disease without esophagitis: Secondary | ICD-10-CM | POA: Diagnosis present

## 2015-01-15 DIAGNOSIS — I288 Other diseases of pulmonary vessels: Secondary | ICD-10-CM | POA: Diagnosis not present

## 2015-01-15 DIAGNOSIS — B377 Candidal sepsis: Secondary | ICD-10-CM | POA: Diagnosis not present

## 2015-01-15 DIAGNOSIS — R61 Generalized hyperhidrosis: Secondary | ICD-10-CM | POA: Diagnosis present

## 2015-01-15 DIAGNOSIS — F419 Anxiety disorder, unspecified: Secondary | ICD-10-CM | POA: Diagnosis present

## 2015-01-15 DIAGNOSIS — J9600 Acute respiratory failure, unspecified whether with hypoxia or hypercapnia: Secondary | ICD-10-CM | POA: Diagnosis not present

## 2015-01-15 DIAGNOSIS — Z930 Tracheostomy status: Secondary | ICD-10-CM | POA: Diagnosis not present

## 2015-01-15 DIAGNOSIS — E876 Hypokalemia: Secondary | ICD-10-CM | POA: Diagnosis present

## 2015-01-15 DIAGNOSIS — G8929 Other chronic pain: Secondary | ICD-10-CM | POA: Diagnosis present

## 2015-01-15 DIAGNOSIS — R918 Other nonspecific abnormal finding of lung field: Secondary | ICD-10-CM | POA: Diagnosis not present

## 2015-01-15 DIAGNOSIS — J986 Disorders of diaphragm: Secondary | ICD-10-CM | POA: Diagnosis not present

## 2015-01-15 DIAGNOSIS — T451X5A Adverse effect of antineoplastic and immunosuppressive drugs, initial encounter: Secondary | ICD-10-CM | POA: Diagnosis present

## 2015-01-15 DIAGNOSIS — J984 Other disorders of lung: Secondary | ICD-10-CM | POA: Diagnosis not present

## 2015-01-15 DIAGNOSIS — B49 Unspecified mycosis: Secondary | ICD-10-CM | POA: Diagnosis not present

## 2015-01-15 DIAGNOSIS — T80219A Unspecified infection due to central venous catheter, initial encounter: Secondary | ICD-10-CM | POA: Diagnosis not present

## 2015-01-15 DIAGNOSIS — K766 Portal hypertension: Secondary | ICD-10-CM | POA: Diagnosis present

## 2015-01-15 DIAGNOSIS — Z452 Encounter for adjustment and management of vascular access device: Secondary | ICD-10-CM

## 2015-01-15 DIAGNOSIS — J069 Acute upper respiratory infection, unspecified: Secondary | ICD-10-CM | POA: Diagnosis not present

## 2015-01-15 DIAGNOSIS — D638 Anemia in other chronic diseases classified elsewhere: Secondary | ICD-10-CM | POA: Diagnosis present

## 2015-01-15 DIAGNOSIS — E43 Unspecified severe protein-calorie malnutrition: Secondary | ICD-10-CM | POA: Diagnosis not present

## 2015-01-15 DIAGNOSIS — R6520 Severe sepsis without septic shock: Secondary | ICD-10-CM | POA: Diagnosis not present

## 2015-01-15 DIAGNOSIS — Y848 Other medical procedures as the cause of abnormal reaction of the patient, or of later complication, without mention of misadventure at the time of the procedure: Secondary | ICD-10-CM | POA: Diagnosis present

## 2015-01-15 DIAGNOSIS — Z7722 Contact with and (suspected) exposure to environmental tobacco smoke (acute) (chronic): Secondary | ICD-10-CM | POA: Diagnosis not present

## 2015-01-15 DIAGNOSIS — R161 Splenomegaly, not elsewhere classified: Secondary | ICD-10-CM | POA: Diagnosis not present

## 2015-01-15 DIAGNOSIS — Z4682 Encounter for fitting and adjustment of non-vascular catheter: Secondary | ICD-10-CM | POA: Diagnosis not present

## 2015-01-15 DIAGNOSIS — Z931 Gastrostomy status: Secondary | ICD-10-CM

## 2015-01-15 DIAGNOSIS — Z7952 Long term (current) use of systemic steroids: Secondary | ICD-10-CM | POA: Diagnosis not present

## 2015-01-15 DIAGNOSIS — K7200 Acute and subacute hepatic failure without coma: Secondary | ICD-10-CM | POA: Diagnosis not present

## 2015-01-15 DIAGNOSIS — I517 Cardiomegaly: Secondary | ICD-10-CM | POA: Diagnosis not present

## 2015-01-15 DIAGNOSIS — R0602 Shortness of breath: Secondary | ICD-10-CM | POA: Diagnosis not present

## 2015-01-15 DIAGNOSIS — Z789 Other specified health status: Secondary | ICD-10-CM | POA: Diagnosis not present

## 2015-01-15 DIAGNOSIS — J80 Acute respiratory distress syndrome: Secondary | ICD-10-CM | POA: Diagnosis present

## 2015-01-15 DIAGNOSIS — R140 Abdominal distension (gaseous): Secondary | ICD-10-CM | POA: Diagnosis not present

## 2015-01-15 DIAGNOSIS — E875 Hyperkalemia: Secondary | ICD-10-CM | POA: Diagnosis not present

## 2015-01-15 DIAGNOSIS — N170 Acute kidney failure with tubular necrosis: Secondary | ICD-10-CM | POA: Diagnosis not present

## 2015-01-15 DIAGNOSIS — M810 Age-related osteoporosis without current pathological fracture: Secondary | ICD-10-CM | POA: Diagnosis present

## 2015-01-15 DIAGNOSIS — Z01818 Encounter for other preprocedural examination: Secondary | ICD-10-CM

## 2015-01-15 DIAGNOSIS — Z5181 Encounter for therapeutic drug level monitoring: Secondary | ICD-10-CM | POA: Diagnosis not present

## 2015-01-15 DIAGNOSIS — N1330 Unspecified hydronephrosis: Secondary | ICD-10-CM | POA: Diagnosis not present

## 2015-01-15 DIAGNOSIS — I330 Acute and subacute infective endocarditis: Secondary | ICD-10-CM | POA: Diagnosis not present

## 2015-01-15 DIAGNOSIS — B3789 Other sites of candidiasis: Secondary | ICD-10-CM | POA: Diagnosis not present

## 2015-01-15 LAB — CBC WITH DIFFERENTIAL/PLATELET
Basophils Absolute: 0 10*3/uL (ref 0.0–0.1)
Basophils Relative: 0 % (ref 0–1)
EOS ABS: 0 10*3/uL (ref 0.0–0.7)
Eosinophils Relative: 0 % (ref 0–5)
HEMATOCRIT: 29.2 % — AB (ref 39.0–52.0)
HEMOGLOBIN: 9 g/dL — AB (ref 13.0–17.0)
Lymphocytes Relative: 1 % — ABNORMAL LOW (ref 12–46)
Lymphs Abs: 0.1 10*3/uL — ABNORMAL LOW (ref 0.7–4.0)
MCH: 25.4 pg — ABNORMAL LOW (ref 26.0–34.0)
MCHC: 30.8 g/dL (ref 30.0–36.0)
MCV: 82.5 fL (ref 78.0–100.0)
Monocytes Absolute: 0.2 10*3/uL (ref 0.1–1.0)
Monocytes Relative: 2 % — ABNORMAL LOW (ref 3–12)
NEUTROS ABS: 10 10*3/uL — AB (ref 1.7–7.7)
Neutrophils Relative %: 97 % — ABNORMAL HIGH (ref 43–77)
PLATELETS: 76 10*3/uL — AB (ref 150–400)
RBC: 3.54 MIL/uL — AB (ref 4.22–5.81)
RDW: 19.4 % — AB (ref 11.5–15.5)
WBC: 10.4 10*3/uL (ref 4.0–10.5)

## 2015-01-15 LAB — BLOOD GAS, ARTERIAL
Acid-base deficit: 6 mmol/L — ABNORMAL HIGH (ref 0.0–2.0)
BICARBONATE: 19.2 meq/L — AB (ref 20.0–24.0)
DRAWN BY: 38235
FIO2: 1 %
O2 Saturation: 98.7 %
PCO2 ART: 40 mmHg (ref 35.0–45.0)
PH ART: 7.304 — AB (ref 7.350–7.450)
PO2 ART: 148 mmHg — AB (ref 80.0–100.0)
Patient temperature: 37
TCO2: 18.4 mmol/L (ref 0–100)

## 2015-01-15 LAB — COMPREHENSIVE METABOLIC PANEL
ALBUMIN: 2.7 g/dL — AB (ref 3.5–5.2)
ALT: 94 U/L — ABNORMAL HIGH (ref 0–53)
AST: 164 U/L — ABNORMAL HIGH (ref 0–37)
Alkaline Phosphatase: 125 U/L — ABNORMAL HIGH (ref 39–117)
Anion gap: 8 (ref 5–15)
BILIRUBIN TOTAL: 1.9 mg/dL — AB (ref 0.3–1.2)
BUN: 62 mg/dL — ABNORMAL HIGH (ref 6–23)
CO2: 20 mmol/L (ref 19–32)
Calcium: 7.3 mg/dL — ABNORMAL LOW (ref 8.4–10.5)
Chloride: 104 mmol/L (ref 96–112)
Creatinine, Ser: 1.38 mg/dL — ABNORMAL HIGH (ref 0.50–1.35)
GFR calc Af Amer: 74 mL/min — ABNORMAL LOW (ref >90)
GFR calc non Af Amer: 64 mL/min — ABNORMAL LOW (ref >90)
GLUCOSE: 176 mg/dL — AB (ref 70–99)
Potassium: 4.5 mmol/L (ref 3.5–5.1)
Sodium: 132 mmol/L — ABNORMAL LOW (ref 135–145)
Total Protein: 5.6 g/dL — ABNORMAL LOW (ref 6.0–8.3)

## 2015-01-15 LAB — I-STAT CG4 LACTIC ACID, ED: Lactic Acid, Venous: 2.72 mmol/L (ref 0.5–2.0)

## 2015-01-15 LAB — MRSA PCR SCREENING: MRSA by PCR: NEGATIVE

## 2015-01-15 MED ORDER — SPIRONOLACTONE 25 MG PO TABS
25.0000 mg | ORAL_TABLET | Freq: Two times a day (BID) | ORAL | Status: DC
  Administered 2015-01-15 – 2015-01-16 (×2): 25 mg via ORAL
  Filled 2015-01-15 (×2): qty 1

## 2015-01-15 MED ORDER — PROMETHAZINE HCL 12.5 MG PO TABS: 25.0000 mg | ORAL_TABLET | Freq: Two times a day (BID) | ORAL | Status: DC | PRN

## 2015-01-15 MED ORDER — ENSURE COMPLETE PO LIQD: 237.0000 mL | Freq: Two times a day (BID) | ORAL | Status: DC

## 2015-01-15 MED ORDER — PIPERACILLIN-TAZOBACTAM 3.375 G IVPB
3.3750 g | Freq: Three times a day (TID) | INTRAVENOUS | Status: DC
  Administered 2015-01-15 – 2015-01-16 (×3): 3.375 g via INTRAVENOUS
  Filled 2015-01-15 (×6): qty 50

## 2015-01-15 MED ORDER — POTASSIUM CHLORIDE 20 MEQ/15ML (10%) PO SOLN
40.0000 meq | Freq: Three times a day (TID) | ORAL | Status: DC
  Administered 2015-01-15 – 2015-01-16 (×2): 40 meq via ORAL
  Filled 2015-01-15 (×2): qty 30

## 2015-01-15 MED ORDER — ENOXAPARIN SODIUM 40 MG/0.4ML ~~LOC~~ SOLN: 40.0000 mg | SUBCUTANEOUS | Status: DC

## 2015-01-15 MED ORDER — VANCOMYCIN HCL IN DEXTROSE 1-5 GM/200ML-% IV SOLN
1000.0000 mg | Freq: Two times a day (BID) | INTRAVENOUS | Status: DC
  Administered 2015-01-16: 1000 mg via INTRAVENOUS
  Filled 2015-01-15 (×3): qty 200

## 2015-01-15 MED ORDER — PANTOPRAZOLE SODIUM 40 MG PO TBEC
40.0000 mg | DELAYED_RELEASE_TABLET | Freq: Two times a day (BID) | ORAL | Status: DC
  Administered 2015-01-15 – 2015-01-16 (×2): 40 mg via ORAL
  Filled 2015-01-15 (×2): qty 1

## 2015-01-15 MED ORDER — VANCOMYCIN HCL IN DEXTROSE 1-5 GM/200ML-% IV SOLN
1000.0000 mg | Freq: Once | INTRAVENOUS | Status: AC
  Administered 2015-01-15: 1000 mg via INTRAVENOUS
  Filled 2015-01-15: qty 200

## 2015-01-15 MED ORDER — OMEGA-3 FATTY ACIDS 1000 MG PO CAPS
1.0000 g | ORAL_CAPSULE | Freq: Three times a day (TID) | ORAL | Status: DC
  Filled 2015-01-15 (×3): qty 1

## 2015-01-15 MED ORDER — HYDROMORPHONE HCL 1 MG/ML IJ SOLN
0.5000 mg | Freq: Once | INTRAMUSCULAR | Status: AC
  Administered 2015-01-15: 0.5 mg via INTRAVENOUS
  Filled 2015-01-15: qty 1

## 2015-01-15 MED ORDER — POLYETHYLENE GLYCOL 3350 17 G PO PACK: 17.0000 g | PACK | Freq: Every day | ORAL | Status: DC | PRN

## 2015-01-15 MED ORDER — DICYCLOMINE HCL 10 MG PO CAPS
10.0000 mg | ORAL_CAPSULE | Freq: Every day | ORAL | Status: DC | PRN
  Filled 2015-01-15: qty 1

## 2015-01-15 MED ORDER — SUCRALFATE 1 G PO TABS
1.0000 g | ORAL_TABLET | Freq: Three times a day (TID) | ORAL | Status: DC
  Administered 2015-01-16: 1 g via ORAL
  Filled 2015-01-15: qty 1

## 2015-01-15 MED ORDER — OXYCODONE HCL 20 MG/ML PO CONC
50.0000 mg | Freq: Four times a day (QID) | ORAL | Status: DC
  Administered 2015-01-15 – 2015-01-16 (×3): 50 mg via ORAL
  Filled 2015-01-15 (×3): qty 3

## 2015-01-15 MED ORDER — PREDNISONE 20 MG PO TABS
20.0000 mg | ORAL_TABLET | Freq: Every day | ORAL | Status: DC
  Administered 2015-01-16: 20 mg via ORAL
  Filled 2015-01-15: qty 1

## 2015-01-15 MED ORDER — CALCIUM CARBONATE-VITAMIN D 500-200 MG-UNIT PO TABS
1.0000 | ORAL_TABLET | Freq: Two times a day (BID) | ORAL | Status: DC
  Administered 2015-01-16: 1 via ORAL
  Filled 2015-01-15 (×3): qty 1

## 2015-01-15 MED ORDER — CETYLPYRIDINIUM CHLORIDE 0.05 % MT LIQD
7.0000 mL | Freq: Two times a day (BID) | OROMUCOSAL | Status: DC
  Administered 2015-01-15 – 2015-01-16 (×2): 7 mL via OROMUCOSAL

## 2015-01-15 MED ORDER — METOLAZONE 5 MG PO TABS
5.0000 mg | ORAL_TABLET | Freq: Two times a day (BID) | ORAL | Status: DC
  Administered 2015-01-15 – 2015-01-16 (×2): 5 mg via ORAL
  Filled 2015-01-15 (×2): qty 1

## 2015-01-15 MED ORDER — ALIGN 4 MG PO CAPS
4.0000 mg | ORAL_CAPSULE | Freq: Every day | ORAL | Status: DC
  Filled 2015-01-15 (×3): qty 1

## 2015-01-15 MED ORDER — SODIUM CHLORIDE 0.9 % IV BOLUS (SEPSIS)
1000.0000 mL | Freq: Once | INTRAVENOUS | Status: AC
  Administered 2015-01-15: 1000 mL via INTRAVENOUS

## 2015-01-15 MED ORDER — PIPERACILLIN-TAZOBACTAM 3.375 G IVPB
INTRAVENOUS | Status: AC
  Filled 2015-01-15: qty 50

## 2015-01-15 MED ORDER — FERROUS GLUCONATE 324 (38 FE) MG PO TABS
324.0000 mg | ORAL_TABLET | Freq: Three times a day (TID) | ORAL | Status: DC
  Administered 2015-01-16: 324 mg via ORAL
  Filled 2015-01-15 (×2): qty 1

## 2015-01-15 MED ORDER — DEXTROSE 5 % IV SOLN
2.0000 g | Freq: Once | INTRAVENOUS | Status: AC
  Administered 2015-01-15: 2 g via INTRAVENOUS
  Filled 2015-01-15: qty 2

## 2015-01-15 MED ORDER — CLINDAMYCIN PHOSPHATE 600 MG/50ML IV SOLN
600.0000 mg | Freq: Once | INTRAVENOUS | Status: AC
  Administered 2015-01-15: 600 mg via INTRAVENOUS
  Filled 2015-01-15: qty 50

## 2015-01-15 MED ORDER — SODIUM CHLORIDE 0.9 % IV SOLN
INTRAVENOUS | Status: DC
  Administered 2015-01-15: 17:00:00 via INTRAVENOUS

## 2015-01-15 NOTE — Progress Notes (Signed)
   Subjective:    Patient ID: David [name], male    DOB: [birth year], 39 y.o.   MRN: [id number]  HPIFollow up from hospital. Concerns about shortness of breath. Patient arrives office for evaluation. Complains of shortness of breath. Couple days duration. Woke up the last couple nights having trouble breathing.  Has had multiple bouts of pneumonia. Mostly aspiration pneumonia. Felt to be related to his reflux and G-tube.  Last hospitalization complicated by worsening anasarca from cirrhosis and liver failure.  Also, a right. G-tube infection with positive Pseudomonas.  Reports 11 temperature 2 nights ago.  Progressively weak.  Significant discussion with Dr. Dionicia Abler yesterday regarding patient's fragile status.  Hospital records reviewed at length prior to discussion today with patient     Review of Systems Positive chronic pain fever noted shortness of breath noted no true chest pain some diffuse abdominal pain    Objective:   Physical Exam Alert chronically ill-appearing no frank tachypnea. Vitals stable. HEENT ill appearing lungs impressive crackles right base right midlung diminished breath sounds primarily right side. Some crackles left base no acute wheezes heart regular rate and rhythm abdomen enlarged obvious ascites ankles 1-2+ edema       Assessment & Plan:  Impression probable reemergence of pneumonia. Patient is very fragile. Also on a path towards potential transplant. Also immune compromise. For all these reasons likely needs to be readmitted to the hospital. I spoke with ER physician. Plan 40-45 minutes spent in all discussion reviews etc. Urgent visit to emergency room for likely admission for pneumonia and other complications regarding chronic illness. WSL

## 2015-01-15 NOTE — H&P (Signed)
[facility] History and Physical  MAYER LAKATOS GBT:[id number] DOB: 1976-02-01 DOA: 01/15/2015  Referring physician: ER PCP: [name], MD   Chief Complaint: Dyspnea  HPI: David [name] is a 39 y.o. male  This is an unfortunate 39 year old man who has history of Crohn's disease and also has history of previous peptic ulcer disease. He is awaiting small bowel transplantation at [facility] [address] [facility]. He also has had gastrojejunostomy in 2004 gated by abdominal wound and resulting mesh. He also has cirrhosis of the liver most likely secondary to [place]. He recently was admitted for aspiration pneumonia. He now gives a 2 day history of dyspnea with coughing today. He is also had fevers. His oxygen saturation is very low and is now requiring a nonrebreather mask to maintain saturations above 90%. Chest x-ray suggestive of pneumonia, probably aspiration. He is now going to be admitted for further management.   Review of Systems:  Apart from above symptoms, all systems negative.  Past Medical History  Diagnosis Date  . 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 Hepatomegaly 05/04/2011  . Fatty liver 05/04/2011    "stage III fatty liver fibrosis" (11/15/2012)  . GERD (gastroesophageal reflux disease)   . Chronic liver disease     /notes 11/15/2012  . Pericarditis     [name] 11/15/2012  . Hypertension   . Pneumonia 1977  . Shortness of breath     "all the time right now" (11/15/2012)  . History of blood transfusion 2004  . History of stomach ulcers   . Duodenal ulcer   . Depression   . Kidney stones     bilaterally/notes 11/15/2012  . Hepatic fibrosis     [name] 11/15/2012  . Anemia   . Pericardial effusion 10/29/2012    moderate to large/notes 11/15/2012  . Anxiety   . Hepatitis   . Crohn's disease   . ED (erectile dysfunction)   . Cirrhosis     secondary to drug effect, +/- fatty liver   Past Surgical  History  Procedure Laterality Date  . Anal examination under anesthesia  02/11/2011    [name]  . Treatment fistula anal  07/03/11    This was a second surgery to repair Anal fistula  . Gastrojejunostomy  2004    "had hole cut in small intestines during endoscopy; had to have OR & leave me open 3 months" (11/15/2012)  . Cholecystectomy  ~ 2003  . Appendectomy  ~ 2003  . Video assisted thoracoscopy  11/17/2012    Procedure: VIDEO ASSISTED THORACOSCOPY;  Surgeon: Loreli Slot, MD;  Location: [facility] OR;  Service: Thoracic;  Laterality: Left;  drainage of left pleural effusion  . Pericardial window  11/17/2012    Procedure: PERICARDIAL WINDOW;  Surgeon: Loreli Slot, MD;  Location: [facility] OR;  Service: Thoracic;  Laterality: N/A;  . Esophagogastroduodenoscopy  01/06/2013    Procedure: ESOPHAGOGASTRODUODENOSCOPY (EGD);  Surgeon: Malissa Hippo, MD;  Location: [facility] SUITE;  Service: Endoscopy;  Laterality: N/A;  11:15  . Esophagogastroduodenoscopy N/A 03/09/2014    Dr. Karilyn Cota: erosive/ulcerative reflux esophagiits, portal gastropathy, moderate amount of bile and food debris in stomach precluding visualization of GJ anastomosis  . Gastrostomy tube placement  11/19/14    [facility]: 57 F APDL catheter. Checked again 12/23 via fluoroscopy and in suitable position  . Colonoscopy  Aug 2015    Dr. Edyth Gunnels at [facility]: congested mucosa in entire colon, solitary  ulcer distal ileum, stricture in ileum 5 cm from IC valve s/p dilation. Surveillance in 1 year  . Exploratory laparotomy  Nov 05, 2014    [facility] [place], Dr. Byrd Hesselbach. Several hour operation with unsuccessful lysis of adhesions due to frozen abdomen  . Picc line     Social History:  reports that he quit smoking about 2 years ago. His smoking use included Cigarettes. He started smoking about 2 years ago. He has a 12 pack-year smoking history. His smokeless tobacco use includes Snuff. He reports that he does not drink alcohol or use illicit  drugs.  Allergies  Allergen Reactions  . Remicade [Infliximab]     Dr Karilyn Cota states previous respiratory arrest not related to remicade. Dr Karilyn Cota states remicade not a drug related allergy. 07-07-2013 at 1025 rapid response called to PACU- Patient difficulty breathing after infusion of Remicade infusing. Do NOT Give Remicade!  . Fentanyl And Related Itching    Swelling, Redness  . Alfentanil Itching    Swelling, Redness  . Wellbutrin [Bupropion] Nausea And Vomiting  . Morphine And Related Hives    Family History  Problem Relation Age of Onset  . Diabetes Mother   . Diabetes Brother   . Healthy Daughter   . Healthy Son   . Colon cancer Neg Hx       Prior to Admission medications   Medication Sig Start Date End Date Taking? Authorizing Provider  calcium-vitamin D (OSCAL 500/200 D-3) 500-200 MG-UNIT per tablet Take 1 tablet by mouth 2 (two) times daily. 11/27/14  Yes Malissa Hippo, MD  dicyclomine (BENTYL) 10 MG capsule Take 1 capsule (10 mg total) by mouth 3 (three) times daily as needed (stomach cramps). Patient taking differently: Take 10 mg by mouth daily as needed for spasms (stomach cramps).  06/18/14  Yes Malissa Hippo, MD  ferrous gluconate (FERGON) 324 MG tablet Take 324 mg by mouth 3 (three) times daily. 11/21/14  Yes Historical Provider, MD  fish oil-omega-3 fatty acids 1000 MG capsule Take 1 g by mouth 3 (three) times daily. 02/09/12  Yes Malissa Hippo, MD  metolazone (ZAROXOLYN) 5 MG tablet Take 1 tablet (5 mg total) by mouth 2 (two) times daily. 01/01/15  Yes Erick Blinks, MD  oxyCODONE (ROXICODONE INTENSOL) 20 MG/ML concentrated solution Take 2.5 mLs (50 mg total) by mouth 4 (four) times daily. 01/14/15  Yes Malissa Hippo, MD  pantoprazole (PROTONIX) 40 MG tablet Take 1 tablet (40 mg total) by mouth 2 (two) times daily. 06/18/14  Yes Malissa Hippo, MD  potassium chloride 20 MEQ/15ML (10%) SOLN Take 30 mLs (40 mEq total) by mouth 3 (three) times daily. 01/01/15  Yes  Erick Blinks, MD  predniSONE (DELTASONE) 20 MG tablet Take 1 tablet (20 mg total) by mouth daily with breakfast. 01/01/15  Yes Erick Blinks, MD  Probiotic Product (ALIGN) 4 MG CAPS Take 4 mg by mouth daily.    Yes Historical Provider, MD  promethazine (PHENERGAN) 25 MG tablet Take 1 tablet (25 mg total) by mouth 2 (two) times daily as needed. nausea 11/27/14  Yes Malissa Hippo, MD  spironolactone (ALDACTONE) 25 MG tablet Take 1 tablet (25 mg total) by mouth 2 (two) times daily. 01/01/15  Yes Erick Blinks, MD  sucralfate (CARAFATE) 1 G tablet Take 1 g by mouth 3 (three) times daily before meals.   Yes Historical Provider, MD  polyethylene glycol (MIRALAX / GLYCOLAX) packet Take 17 g by mouth daily as needed for mild constipation or  moderate constipation.  10/26/14   Historical Provider, MD   Physical Exam: Filed Vitals:   01/15/15 1700 01/15/15 1715 01/15/15 1730 01/15/15 1745  BP: 95/61  98/52   Pulse: 99  101 105  Temp:      TempSrc:      Resp: Height:      Weight:      SpO2: 93%  90% 91%    Wt Readings from Last 3 Encounters:  01/15/15 87.091 kg (192 lb)  01/15/15 87.091 kg (192 lb)  01/01/15 80.74 kg (178 lb)    General:  Appears sick. He has increased work of breathing at rest. He feels warm. There is no peripheral central cyanosis. He is alert and oriented at the present time. Eyes: PERRL, normal lids, irises & conjunctiva ENT: grossly normal hearing, lips & tongue Neck: no LAD, masses or thyromegaly Cardiovascular: RRR, no m/r/g. No LE edema. Telemetry: SR, no arrhythmias  Respiratory: Crackles in the right mid and lower zones, some in the left mid zone. No bronchial breathing or wheezing. Abdomen: soft, ntnd Skin: no rash or induration seen on limited exam Musculoskeletal: grossly normal tone BUE/BLE Psychiatric: grossly normal mood and affect, speech fluent and appropriate Neurologic: grossly non-focal.          Labs on Admission:  Basic Metabolic  Panel:  Recent Labs Lab 01/15/15 1440  NA 132*  K 4.5  CL 104  CO2 20  GLUCOSE 176*  BUN 62*  CREATININE 1.38*  CALCIUM 7.3*   Liver Function Tests:  Recent Labs Lab 01/15/15 1440  AST 164*  ALT 94*  ALKPHOS 125*  BILITOT 1.9*  PROT 5.6*  ALBUMIN 2.7*   No results for input(s): LIPASE, AMYLASE in the last 168 hours. No results for input(s): AMMONIA in the last 168 hours. CBC:  Recent Labs Lab 01/15/15 1440  WBC 10.4  NEUTROABS 10.0*  HGB 9.0*  HCT 29.2*  MCV 82.5  PLT 76*   Cardiac Enzymes: No results for input(s): CKTOTAL, CKMB, CKMBINDEX, TROPONINI in the last 168 hours.  BNP (last 3 results) No results for input(s): BNP in the last 8760 hours.  ProBNP (last 3 results) No results for input(s): PROBNP in the last 8760 hours.  CBG: No results for input(s): GLUCAP in the last 168 hours.  Radiological Exams on Admission: Dg Chest 2 View  01/15/2015   CLINICAL DATA:  Pneumonia  EXAM: CHEST  2 VIEW  COMPARISON:  12/26/2014  FINDINGS: Right upper extremity PICC, tip at the distal SVC.  Chronically low lung volumes with reticular nodular densities at the right base. There is new patchy airspace opacity in the bilateral anterior lungs. No edema, effusion, or pneumothorax. Normal heart size and mediastinal contours.  IMPRESSION: Bilateral pneumonia.   Electronically Signed   By: Tiburcio Pea M.D.   On: 01/15/2015 14:39      Assessment/Plan   1. Healthcare associated pneumonia. This may be aspiration pneumonia again. We will treat him with intravenous vancomycin and Zosyn. He will be given oxygen support. He will be admitted to the stepdown unit. We will obtain a stat blood gas and ask pulmonology to see him. He asked he may need intubation and mechanical ventilation. 2. Crohn's disease of small and large bowel. Awaiting small bowel transplantation. 3. Hypertension. 4. Protein calorie malrotation, severe. 5. Liver cirrhosis secondary to [place],  stable.  Further recommendations will depend on patient's hospital progress.   Code Status: Full code.  DVT Prophylaxis: SCDs.  Family Communication: I discussed the plan with the patient at the bedside.   Disposition Plan: Home when medically stable.   Time spent: 60 minutes.  Doree Albee [facility] Pager [phone].

## 2015-01-15 NOTE — Telephone Encounter (Signed)
Discussed with Dr.Rehman. He states that patient has Zaroxoyln . He is to take 5 mg today ,and 5 mg tomorrow. This was called to [facility] @ [phone].

## 2015-01-15 NOTE — ED Notes (Signed)
Pt's O2 sats on room air were 80%. Placed pt on 4L [place] and sats 94%.

## 2015-01-15 NOTE — ED Provider Notes (Signed)
This chart was scribed for David Maw Brannon Levene, DO by Milly Jakob, ED Scribe. The patient was seen in room [facility]12[facility]. Patient's care was started at 3:00 PM.  Chief Complaint  Patient presents with  . Pneumonia   HPI Comments: David GONGAWARE is a 39 y.o. 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 He reports he has had a productive cough. Does not wear oxygen at baseline. His hypoxic in the ED. Denies chest pain. Has chronic abdominal pain that he reports is unchanged as well as unchanged abdominal distention. He reports that he saw his PCP Dr. Gerda Diss this morning, and he was sent here.  He reports having a picc line in his right upper arm.   ROS: See HPI Constitutional: fever  Eyes: no drainage  ENT: no runny nose   Cardiovascular:  no chest pain  Resp: SOB  GI: no vomiting GU: no dysuria Integumentary: no rash  Allergy: no hives  Musculoskeletal: no leg swelling  Neurological: no slurred speech ROS otherwise negative  PAST MEDICAL HISTORY/PAST SURGICAL HISTORY:  Past Medical History  Diagnosis Date  . 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 Hepatomegaly 05/04/2011  . Fatty liver 05/04/2011    "stage III fatty liver fibrosis" (11/15/2012)  . GERD (gastroesophageal reflux disease)   . Chronic liver disease     /notes 11/15/2012  . Pericarditis     [name] 11/15/2012  . Hypertension   . Pneumonia 1977  . Shortness of breath     "all the time right now" (11/15/2012)  . History of blood transfusion 2004  . History of stomach ulcers   . Duodenal ulcer   . Depression   . Kidney stones     bilaterally/notes 11/15/2012  . Hepatic fibrosis      [name] 11/15/2012  . Anemia   . Pericardial effusion 10/29/2012    moderate to large/notes 11/15/2012  . Anxiety   . Hepatitis   . Crohn's disease   . ED (erectile dysfunction)   . Cirrhosis     secondary to drug effect, +/- fatty liver    MEDICATIONS:  Prior to Admission medications   Medication Sig Start Date End Date Taking? Authorizing Provider  calcium-vitamin D (OSCAL 500/200 D-3) 500-200 MG-UNIT per tablet Take 1 tablet by mouth 2 (two) times daily. 11/27/14   Malissa Hippo, MD  dicyclomine (BENTYL) 10 MG capsule Take 1 capsule (10 mg total) by mouth 3 (three) times daily as needed (stomach cramps). Patient taking differently: Take 10 mg by mouth daily as needed for spasms (stomach cramps).  06/18/14   Malissa Hippo, MD  ferrous gluconate (FERGON) 324 MG tablet Take 324 mg by mouth 3 (three) times daily. 11/21/14   Historical Provider, MD  fish oil-omega-3 fatty acids 1000 MG capsule Take 1 g by mouth 3 (three) times daily. 02/09/12   Malissa Hippo, MD  metolazone (ZAROXOLYN) 5 MG tablet Take 1 tablet (5 mg total) by mouth 2 (two) times daily. 01/01/15   Erick Blinks, MD  oxyCODONE (ROXICODONE INTENSOL) 20 MG/ML concentrated solution Take 2.5 mLs (50 mg total) by mouth 4 (four) times daily. 01/14/15   Malissa Hippo, MD  pantoprazole (PROTONIX) 40 MG tablet Take 1 tablet (40 mg total) by mouth 2 (two) times daily. 06/18/14   Malissa Hippo, MD  polyethylene glycol (MIRALAX / GLYCOLAX) packet Take 17 g by mouth daily as needed for mild constipation or moderate constipation.  10/26/14   Historical Provider, MD  potassium chloride 20 MEQ/15ML (10%) SOLN Take 30 mLs (40 mEq total) by mouth 3 (three) times daily. 01/01/15   Erick Blinks, MD  predniSONE (DELTASONE) 20 MG tablet Take 1 tablet (20 mg total) by mouth daily with breakfast. 01/01/15   Erick Blinks, MD  Probiotic Product (ALIGN) 4 MG CAPS Take 4 mg by mouth daily.     Historical Provider, MD  promethazine (PHENERGAN) 25  MG tablet Take 1 tablet (25 mg total) by mouth 2 (two) times daily as needed. nausea 11/27/14   Malissa Hippo, MD  spironolactone (ALDACTONE) 25 MG tablet Take 1 tablet (25 mg total) by mouth 2 (two) times daily. 01/01/15   Erick Blinks, MD  sucralfate (CARAFATE) 1 G tablet Take 1 g by mouth 3 (three) times daily before meals.    Historical Provider, MD    ALLERGIES:  Allergies  Allergen Reactions  . Remicade [Infliximab]     Dr Karilyn Cota states previous respiratory arrest not related to remicade. Dr Karilyn Cota states remicade not a drug related allergy. 07-07-2013 at 1025 rapid response called to PACU- Patient difficulty breathing after infusion of Remicade infusing. Do NOT Give Remicade!  . Fentanyl And Related Itching    Swelling, Redness  . Alfentanil Itching    Swelling, Redness  . Wellbutrin [Bupropion] Nausea And Vomiting  . Morphine And Related Hives    SOCIAL HISTORY:  History  Substance Use Topics  . Smoking status: Former Smoker -- 0.50 packs/day for 24 years    Types: Cigarettes    Start date: 10/28/2012    Quit date: 11/22/2012  . Smokeless tobacco: Current User    Types: Snuff     Comment: 1/2 pack a month  . Alcohol Use: No    FAMILY HISTORY: Family History  Problem Relation Age of Onset  . Diabetes Mother   . Diabetes Brother   . Healthy Daughter   . Healthy Son   . Colon cancer Neg Hx     EXAM: Triage Vitals: BP 102/63 mmHg  Pulse 110  Temp(Src) 97.7 F (36.5 C) (Oral)  Resp 26  Ht  (1.803 m)  Wt 192 lb (87.091 kg)  BMI 26.79 kg/m2  SpO2 90% CONSTITUTIONAL: Alert and oriented and responds appropriately to questions. Chronically ill appearing.  HEAD: Normocephalic EYES: Conjunctivae clear, PERRL ENT: normal nose; no rhinorrhea; moist mucous membranes; pharynx without lesions noted NECK: Supple, no meningismus, no LAD  CARD: RRR; S1 and S2 appreciated; no murmurs, no clicks, no rubs, no gallops RESP: Normal chest excursion without splinting or  tachypnea; breath sounds equal bilaterally; bibasilar crackles ABD/GI:Diminished bowel sounds, Distended abdomen, anasarca, G-tube in the upper abdomen BACK:  The back appears normal and is non-tender to palpation, there is no CVA tenderness EXT: Normal ROM in all joints; non-tender to palpation; no edema; normal capillary refill; no cyanosis, picc line in the right upper extremity    SKIN: Normal color for age and race; warm NEURO: Moves all extremities equally PSYCH: The patient's mood and manner are appropriate. Grooming and personal hygiene are appropriate.  MEDICAL DECISION MAKING:  Patient here with bilateral pneumonia on chest x-ray. He is hypoxic with new oxygen requirement with sats  in the upper 80s on room air. His blood pressure is also slightly low but this appears chronic for patient. Will obtain labs, cultures, lactate. We'll give broad-spectrum device given he recently was admitted to the hospital to cover him for healthcare associated pneumonia. We'll also give clindamycin to cover for aspiration pneumonia. Anticipate admission.  ED PROGRESS: Patient's lactate is elevated to 2.72. Does not have a leukocytosis but does have predominant neutrophils. Sodium is 132. Creatinine mildly elevated. LFTs at his baseline. We'll discuss with hospitalist for admission.   D/w Dr. Karilyn Cota for admission to stepdown.  David Maw Milee Qualls, DO 01/15/15 1645

## 2015-01-15 NOTE — Progress Notes (Addendum)
ANTIBIOTIC CONSULT NOTE  Pharmacy Consult for Vancomycin Indication: pneumonia  Allergies  Allergen Reactions  . Remicade [Infliximab]     Dr Karilyn Cota states previous respiratory arrest not related to remicade. Dr Karilyn Cota states remicade not a drug related allergy. 07-07-2013 at 1025 rapid response called to PACU- Patient difficulty breathing after infusion of Remicade infusing. Do NOT Give Remicade!  . Fentanyl And Related Itching    Swelling, Redness  . Alfentanil Itching    Swelling, Redness  . Wellbutrin [Bupropion] Nausea And Vomiting  . Morphine And Related Hives    Patient Measurements: Height:  (180.3 cm) Weight: 192 lb (87.091 kg) IBW/kg (Calculated) : 75.3  Vital Signs: Temp: 97.7 F (36.5 C) (02/02 1416) Temp Source: Oral (02/02 1416) BP: 99/60 mmHg (02/02 1630) Pulse Rate: 97 (02/02 1630) Intake/Output from previous day:   Intake/Output from this shift:    Labs:  Recent Labs  01/15/15 1440  WBC 10.4  HGB 9.0*  PLT 76*  CREATININE 1.38*   Estimated Creatinine Clearance: 77.3 mL/min (by C-G formula based on Cr of 1.38). No results for input(s): VANCOTROUGH, VANCOPEAK, VANCORANDOM, GENTTROUGH, GENTPEAK, GENTRANDOM, TOBRATROUGH, TOBRAPEAK, TOBRARND, AMIKACINPEAK, AMIKACINTROU, AMIKACIN in the last 72 hours.   Microbiology: Recent Results (from the past 720 hour(s))  Wound culture     Status: None   Collection Time: 12/21/14  6:44 AM  Result Value Ref Range Status   Specimen Description G/T SITE  Final   Special Requests Normal  Final   Gram Stain   Final    FEW WBC PRESENT,BOTH PMN AND MONONUCLEAR NO SQUAMOUS EPITHELIAL CELLS SEEN NO ORGANISMS SEEN Performed at [facility]    Culture   Final    MULTIPLE ORGANISMS PRESENT, NONE PREDOMINANT Note: NO STAPHYLOCOCCUS AUREUS ISOLATED NO GROUP A STREP (S.PYOGENES) ISOLATED Performed at [facility]    Report Status 12/24/2014 FINAL  Final  Culture, Urine     Status: None   Collection Time: 12/21/14  7:35 AM  Result Value Ref Range Status   Specimen Description URINE, CLEAN CATCH  Final   Special Requests Normal  Final   Colony Count   Final    2,000 COLONIES/ML Performed at [facility]    Culture   Final    INSIGNIFICANT GROWTH Performed at [facility]    Report Status 12/22/2014 FINAL  Final  Culture, blood (routine x 2)     Status: None   Collection Time: 12/21/14  8:26 AM  Result Value Ref Range Status   Specimen Description BLOOD LEFT ANTECUBITAL  Final   Special Requests BOTTLES DRAWN AEROBIC AND ANAEROBIC 10CC BOTTLES  Final   Culture NO GROWTH 5 DAYS  Final   Report Status 12/26/2014 FINAL  Final  Culture, blood (routine x 2)     Status: None   Collection Time: 12/21/14  8:31 AM  Result Value Ref Range Status   Specimen Description BLOOD LEFT HAND  Final   Special Requests BOTTLES DRAWN AEROBIC ONLY 6CC BOTTLE  Final   Culture NO GROWTH 5 DAYS  Final   Report Status 12/26/2014 FINAL  Final  Clostridium Difficile by PCR     Status: None   Collection Time: 12/21/14  1:56 PM  Result Value Ref Range Status   C difficile by pcr NEGATIVE NEGATIVE Final  Wound culture     Status: None   Collection Time: 12/24/14  2:30 PM  Result Value Ref Range Status   Specimen Description WOUND GASTROSTOMY TUBE SITE  Final   Special Requests NONE  Final   Gram Stain   Final    RARE WBC PRESENT, PREDOMINANTLY MONONUCLEAR NO SQUAMOUS EPITHELIAL CELLS SEEN NO ORGANISMS SEEN Performed at [facility]    Culture   Final    RARE PSEUDOMONAS AERUGINOSA Performed at [facility]    Report Status 12/28/2014 FINAL  Final   Organism ID, Bacteria PSEUDOMONAS AERUGINOSA  Final      Susceptibility   Pseudomonas aeruginosa - MIC*    CEFEPIME 8 SENSITIVE Sensitive     CEFTAZIDIME 4 SENSITIVE Sensitive     CIPROFLOXACIN <=0.25 SENSITIVE Sensitive     GENTAMICIN 8 INTERMEDIATE Intermediate     IMIPENEM 1 SENSITIVE Sensitive      PIP/TAZO 8 SENSITIVE Sensitive     TOBRAMYCIN <=1 SENSITIVE Sensitive     * RARE PSEUDOMONAS AERUGINOSA  Clostridium Difficile by PCR     Status: None   Collection Time: 12/26/14 10:16 PM  Result Value Ref Range Status   C difficile by pcr NEGATIVE NEGATIVE Final  Wound culture     Status: None   Collection Time: 12/31/14 12:54 AM  Result Value Ref Range Status   Specimen Description WOUND ABDOMEN  Final   Special Requests IMMUNE:COMPROMISED  Final   Gram Stain   Final    RARE WBC PRESENT, PREDOMINANTLY MONONUCLEAR NO SQUAMOUS EPITHELIAL CELLS SEEN RARE GRAM POSITIVE COCCI IN PAIRS IN CLUSTERS FEW YEAST Performed at [facility]    Culture   Final    MULTIPLE ORGANISMS PRESENT, NONE PREDOMINANT Note: NO GROUP A STREP (S.PYOGENES) ISOLATED NO STAPHYLOCOCCUS AUREUS ISOLATED Performed at [facility]    Report Status 01/03/2015 FINAL  Final    Anti-infectives    Start     Dose/Rate Route Frequency Ordered Stop   01/15/15 1600  vancomycin (VANCOCIN) IVPB 1000 mg/200 mL premix     1,000 mg200 mL/hr over 60 Minutes Intravenous  Once 01/15/15 1557     01/15/15 1500  ceFEPIme (MAXIPIME) 2 g in dextrose 5 % 50 mL IVPB     2 g100 mL/hr over 30 Minutes Intravenous  Once 01/15/15 1458 01/15/15 1540   01/15/15 1500  clindamycin (CLEOCIN) IVPB 600 mg     600 mg100 mL/hr over 30 Minutes Intravenous  Once 01/15/15 1458 01/15/15 1537      Assessment: 38 yoM with +bilateral PNA per CXR.  Hx frequent aspiration.  Multiple hospitalizations in past 3 months (last discharged 01/01/15).   He is afebrile with normal WBC.  Lactic acid elevated.  Renal function is elevated above patient's baseline.   Vancomycin 2/2>> Cefepime 2/2>> Clindamycin 2/2>>  Goal of Therapy:  Vancomycin trough level 15-20 mcg/ml  Plan:  Vancomycin 1gm IV q12h Check Vancomycin trough at steady state Monitor renal function and cx data  Consider change Cefepime & Clindamycin to Zosyn on admission for  coverage of HCAP & aspiration PNA  Elson Clan 01/15/2015,5:09 PM  Addendum: Zosyn 3.375gm IV every 8 hours. Follow-up micro data, labs, vitals.   Jesie, [place], [facility]  01/15/2015 6:52 PM

## 2015-01-15 NOTE — Telephone Encounter (Signed)
Tabathia, advance home health nurse, called and said David [name]'s weight is 183 lbs. On 01/06/15, his weight was 170 lbs. Her return phone number is [phone].

## 2015-01-15 NOTE — ED Notes (Addendum)
PT was d/c from hospital 12/31/14 on home TPN d/t chronic chron's disease and has been battling pneumonia x1 year from aspiration. PT on liquid diet and very soft foods. PT sent from Dr. Gerda Diss office d/t thought of pneumonia. PT has picc line to right upper arm in place.

## 2015-01-16 ENCOUNTER — Telehealth (INDEPENDENT_AMBULATORY_CARE_PROVIDER_SITE_OTHER): Payer: Self-pay | Admitting: *Deleted

## 2015-01-16 ENCOUNTER — Inpatient Hospital Stay (HOSPITAL_COMMUNITY): Payer: Medicare Other

## 2015-01-16 ENCOUNTER — Inpatient Hospital Stay (HOSPITAL_COMMUNITY): Payer: Medicare Other | Admitting: Anesthesiology

## 2015-01-16 DIAGNOSIS — J189 Pneumonia, unspecified organism: Secondary | ICD-10-CM | POA: Diagnosis present

## 2015-01-16 DIAGNOSIS — N179 Acute kidney failure, unspecified: Secondary | ICD-10-CM | POA: Diagnosis present

## 2015-01-16 DIAGNOSIS — K50813 Crohn's disease of both small and large intestine with fistula: Secondary | ICD-10-CM

## 2015-01-16 DIAGNOSIS — J9601 Acute respiratory failure with hypoxia: Secondary | ICD-10-CM | POA: Diagnosis present

## 2015-01-16 DIAGNOSIS — D696 Thrombocytopenia, unspecified: Secondary | ICD-10-CM | POA: Diagnosis present

## 2015-01-16 DIAGNOSIS — Z789 Other specified health status: Secondary | ICD-10-CM

## 2015-01-16 DIAGNOSIS — Z978 Presence of other specified devices: Secondary | ICD-10-CM | POA: Diagnosis present

## 2015-01-16 LAB — COMPREHENSIVE METABOLIC PANEL
ALBUMIN: 2.5 g/dL — AB (ref 3.5–5.2)
ALK PHOS: 139 U/L — AB (ref 39–117)
ALT: 97 U/L — ABNORMAL HIGH (ref 0–53)
ALT: 108 U/L — AB (ref 0–53)
ANION GAP: 9 (ref 5–15)
ANION GAP: 12 (ref 5–15)
AST: 162 U/L — AB (ref 0–37)
AST: 257 U/L — AB (ref 0–37)
Albumin: 2.6 g/dL — ABNORMAL LOW (ref 3.5–5.2)
Alkaline Phosphatase: 198 U/L — ABNORMAL HIGH (ref 39–117)
BUN: 62 mg/dL — ABNORMAL HIGH (ref 6–23)
BUN: 68 mg/dL — AB (ref 6–23)
CO2: 22 mmol/L (ref 19–32)
CO2: 16 mmol/L — AB (ref 19–32)
CREATININE: 2.1 mg/dL — AB (ref 0.50–1.35)
Calcium: 7.2 mg/dL — ABNORMAL LOW (ref 8.4–10.5)
Calcium: 7 mg/dL — ABNORMAL LOW (ref 8.4–10.5)
Chloride: 104 mmol/L (ref 96–112)
Chloride: 105 mmol/L (ref 96–112)
Creatinine, Ser: 1.24 mg/dL (ref 0.50–1.35)
GFR calc Af Amer: 84 mL/min — ABNORMAL LOW (ref >90)
GFR calc Af Amer: 44 mL/min — ABNORMAL LOW (ref >90)
GFR calc non Af Amer: 72 mL/min — ABNORMAL LOW (ref >90)
GFR calc non Af Amer: 38 mL/min — ABNORMAL LOW (ref >90)
Glucose, Bld: 85 mg/dL (ref 70–99)
Glucose, Bld: 79 mg/dL (ref 70–99)
POTASSIUM: 7 mmol/L — AB (ref 3.5–5.1)
Potassium: 4.7 mmol/L (ref 3.5–5.1)
Sodium: 135 mmol/L (ref 135–145)
Sodium: 133 mmol/L — ABNORMAL LOW (ref 135–145)
TOTAL PROTEIN: 5.6 g/dL — AB (ref 6.0–8.3)
Total Bilirubin: 3.3 mg/dL — ABNORMAL HIGH (ref 0.3–1.2)
Total Bilirubin: 4.9 mg/dL — ABNORMAL HIGH (ref 0.3–1.2)
Total Protein: 5.4 g/dL — ABNORMAL LOW (ref 6.0–8.3)

## 2015-01-16 LAB — GLUCOSE, CAPILLARY
GLUCOSE-CAPILLARY: 92 mg/dL (ref 70–99)
Glucose-Capillary: 118 mg/dL — ABNORMAL HIGH (ref 70–99)

## 2015-01-16 LAB — BLOOD GAS, ARTERIAL
ACID-BASE DEFICIT: 12.8 mmol/L — AB (ref 0.0–2.0)
Acid-base deficit: 10.2 mmol/L — ABNORMAL HIGH (ref 0.0–2.0)
BICARBONATE: 15.8 meq/L — AB (ref 20.0–24.0)
Bicarbonate: 16.1 mEq/L — ABNORMAL LOW (ref 20.0–24.0)
DRAWN BY: 23534
Drawn by: 23534
FIO2: 0.85 %
FIO2: 1 %
LHR: 16 {breaths}/min
MECHVT: 600 mL
O2 CONTENT: 100 L/min
O2 Content: 85 L/min
O2 SAT: 89.6 %
O2 Saturation: 98.3 %
PATIENT TEMPERATURE: 37
PATIENT TEMPERATURE: 37
PEEP: 5 cmH2O
PO2 ART: 68.2 mmHg — AB (ref 80.0–100.0)
TCO2: 15.2 mmol/L (ref 0–100)
TCO2: 16.5 mmol/L (ref 0–100)
pCO2 arterial: 38.5 mmHg (ref 35.0–45.0)
pCO2 arterial: 62.6 mmHg (ref 35.0–45.0)
pH, Arterial: 7.237 — ABNORMAL LOW (ref 7.350–7.450)
pH, Arterial: 7.04 — CL (ref 7.350–7.450)
pO2, Arterial: 217 mmHg — ABNORMAL HIGH (ref 80.0–100.0)

## 2015-01-16 LAB — CBC
HCT: 30.9 % — ABNORMAL LOW (ref 39.0–52.0)
HCT: 33.2 % — ABNORMAL LOW (ref 39.0–52.0)
Hemoglobin: 9.5 g/dL — ABNORMAL LOW (ref 13.0–17.0)
Hemoglobin: 10.3 g/dL — ABNORMAL LOW (ref 13.0–17.0)
MCH: 25.5 pg — AB (ref 26.0–34.0)
MCH: 25.9 pg — AB (ref 26.0–34.0)
MCHC: 30.7 g/dL (ref 30.0–36.0)
MCHC: 31 g/dL (ref 30.0–36.0)
MCV: 82.8 fL (ref 78.0–100.0)
MCV: 83.6 fL (ref 78.0–100.0)
Platelets: 57 10*3/uL — ABNORMAL LOW (ref 150–400)
Platelets: 120 10*3/uL — ABNORMAL LOW (ref 150–400)
RBC: 3.73 MIL/uL — ABNORMAL LOW (ref 4.22–5.81)
RBC: 3.97 MIL/uL — ABNORMAL LOW (ref 4.22–5.81)
RDW: 19.5 % — ABNORMAL HIGH (ref 11.5–15.5)
RDW: 20.2 % — ABNORMAL HIGH (ref 11.5–15.5)
WBC: 9.1 10*3/uL (ref 4.0–10.5)
WBC: 31.1 10*3/uL — ABNORMAL HIGH (ref 4.0–10.5)

## 2015-01-16 LAB — BASIC METABOLIC PANEL
Anion gap: 14 (ref 5–15)
BUN: 72 mg/dL — AB (ref 6–23)
CO2: 14 mmol/L — ABNORMAL LOW (ref 19–32)
Calcium: 6.8 mg/dL — ABNORMAL LOW (ref 8.4–10.5)
Chloride: 103 mmol/L (ref 96–112)
Creatinine, Ser: 2.29 mg/dL — ABNORMAL HIGH (ref 0.50–1.35)
GFR, EST AFRICAN AMERICAN: 40 mL/min — AB (ref >90)
GFR, EST NON AFRICAN AMERICAN: 34 mL/min — AB (ref >90)
Glucose, Bld: 93 mg/dL (ref 70–99)
Potassium: 7.2 mmol/L (ref 3.5–5.1)
Sodium: 131 mmol/L — ABNORMAL LOW (ref 135–145)

## 2015-01-16 LAB — POCT I-STAT 3, ART BLOOD GAS (G3+)
ACID-BASE DEFICIT: 12 mmol/L — AB (ref 0.0–2.0)
Acid-base deficit: 12 mmol/L — ABNORMAL HIGH (ref 0.0–2.0)
Bicarbonate: 16.6 mEq/L — ABNORMAL LOW (ref 20.0–24.0)
Bicarbonate: 15.5 mEq/L — ABNORMAL LOW (ref 20.0–24.0)
O2 SAT: 96 %
O2 Saturation: 96 %
PO2 ART: 109 mmHg — AB (ref 80.0–100.0)
Patient temperature: 98.1
TCO2: 18 mmol/L (ref 0–100)
TCO2: 17 mmol/L (ref 0–100)
pCO2 arterial: 48.1 mmHg — ABNORMAL HIGH (ref 35.0–45.0)
pCO2 arterial: 41.9 mmHg (ref 35.0–45.0)
pH, Arterial: 7.144 — CL (ref 7.350–7.450)
pH, Arterial: 7.174 — CL (ref 7.350–7.450)
pO2, Arterial: 100 mmHg (ref 80.0–100.0)

## 2015-01-16 LAB — LACTIC ACID, PLASMA: Lactic Acid, Venous: 4.7 mmol/L (ref 0.5–2.0)

## 2015-01-16 LAB — TRIGLYCERIDES: Triglycerides: 301 mg/dL — ABNORMAL HIGH (ref <150)

## 2015-01-16 LAB — STREP PNEUMONIAE URINARY ANTIGEN: Strep Pneumo Urinary Antigen: NEGATIVE

## 2015-01-16 LAB — TROPONIN I: Troponin I: 0.07 ng/mL — ABNORMAL HIGH (ref <0.031)

## 2015-01-16 LAB — AMYLASE: Amylase: 52 U/L (ref 0–105)

## 2015-01-16 LAB — POTASSIUM: Potassium: 7.1 mmol/L (ref 3.5–5.1)

## 2015-01-16 LAB — LIPASE, BLOOD: LIPASE: 24 U/L (ref 11–59)

## 2015-01-16 LAB — POCT I-STAT 4, (NA,K, GLUC, HGB,HCT)
Glucose, Bld: 113 mg/dL — ABNORMAL HIGH (ref 70–99)
HCT: 31 % — ABNORMAL LOW (ref 39.0–52.0)
HEMOGLOBIN: 10.5 g/dL — AB (ref 13.0–17.0)
Potassium: 6.7 mmol/L (ref 3.5–5.1)
Sodium: 132 mmol/L — ABNORMAL LOW (ref 135–145)

## 2015-01-16 LAB — PROCALCITONIN: Procalcitonin: 30.89 ng/mL

## 2015-01-16 MED ORDER — RISAQUAD PO CAPS
1.0000 | ORAL_CAPSULE | Freq: Every day | ORAL | Status: DC
  Filled 2015-01-16: qty 1

## 2015-01-16 MED ORDER — RISAQUAD PO CAPS
1.0000 | ORAL_CAPSULE | Freq: Every day | ORAL | Status: DC
Start: 2015-01-16 — End: 2015-01-16
  Filled 2015-01-16 (×3): qty 1

## 2015-01-16 MED ORDER — HYDROMORPHONE HCL 1 MG/ML IJ SOLN: 0.5000 mg | INTRAMUSCULAR | Status: DC | PRN

## 2015-01-16 MED ORDER — PIPERACILLIN-TAZOBACTAM 3.375 G IVPB
3.3750 g | Freq: Three times a day (TID) | INTRAVENOUS | Status: DC
  Administered 2015-01-16: 3.375 g via INTRAVENOUS
  Filled 2015-01-16 (×3): qty 50

## 2015-01-16 MED ORDER — CETYLPYRIDINIUM CHLORIDE 0.05 % MT LIQD
7.0000 mL | Freq: Four times a day (QID) | OROMUCOSAL | Status: DC
  Administered 2015-01-17 – 2015-01-22 (×22): 7 mL via OROMUCOSAL

## 2015-01-16 MED ORDER — DEXTROSE 50 % IV SOLN
INTRAVENOUS | Status: AC
  Administered 2015-01-16: 50 mL via INTRAVENOUS
  Filled 2015-01-16: qty 50

## 2015-01-16 MED ORDER — ALBUTEROL SULFATE (2.5 MG/3ML) 0.083% IN NEBU: 2.5000 mg | INHALATION_SOLUTION | RESPIRATORY_TRACT | Status: DC | PRN

## 2015-01-16 MED ORDER — MIDAZOLAM HCL 2 MG/2ML IJ SOLN
2.0000 mg | Freq: Once | INTRAMUSCULAR | Status: AC
  Administered 2015-01-16: 2 mg via INTRAVENOUS

## 2015-01-16 MED ORDER — HYDROCORTISONE NA SUCCINATE PF 100 MG IJ SOLR
50.0000 mg | Freq: Three times a day (TID) | INTRAMUSCULAR | Status: DC
  Administered 2015-01-16: 50 mg via INTRAVENOUS
  Filled 2015-01-16 (×2): qty 1
  Filled 2015-01-16: qty 2

## 2015-01-16 MED ORDER — GUAIFENESIN ER 600 MG PO TB12
1200.0000 mg | ORAL_TABLET | Freq: Two times a day (BID) | ORAL | Status: DC
  Administered 2015-01-16: 1200 mg via ORAL
  Filled 2015-01-16: qty 2

## 2015-01-16 MED ORDER — SUCCINYLCHOLINE CHLORIDE 20 MG/ML IJ SOLN
INTRAMUSCULAR | Status: DC | PRN
  Administered 2015-01-16: 100 mg via INTRAVENOUS

## 2015-01-16 MED ORDER — CETYLPYRIDINIUM CHLORIDE 0.05 % MT LIQD: 7.0000 mL | Freq: Four times a day (QID) | OROMUCOSAL | Status: DC

## 2015-01-16 MED ORDER — INSULIN ASPART 100 UNIT/ML IV SOLN
10.0000 [IU] | INTRAVENOUS | Status: AC
  Administered 2015-01-16: 10 [IU] via INTRAVENOUS

## 2015-01-16 MED ORDER — VANCOMYCIN HCL IN DEXTROSE 1-5 GM/200ML-% IV SOLN
INTRAVENOUS | Status: AC
  Filled 2015-01-16: qty 200

## 2015-01-16 MED ORDER — HYDROMORPHONE HCL 1 MG/ML IJ SOLN
0.5000 mg | Freq: Four times a day (QID) | INTRAMUSCULAR | Status: DC | PRN
  Administered 2015-01-16: 0.5 mg via INTRAVENOUS
  Filled 2015-01-16: qty 1

## 2015-01-16 MED ORDER — NOREPINEPHRINE BITARTRATE 1 MG/ML IV SOLN
0.0000 ug/min | INTRAVENOUS | Status: DC
  Administered 2015-01-16: 20 ug/min via INTRAVENOUS
  Filled 2015-01-16 (×2): qty 4

## 2015-01-16 MED ORDER — DEXTROSE 50 % IV SOLN
1.0000 | INTRAVENOUS | Status: AC
  Administered 2015-01-16: 50 mL via INTRAVENOUS
  Filled 2015-01-16: qty 50

## 2015-01-16 MED ORDER — LEVALBUTEROL HCL 0.63 MG/3ML IN NEBU: 0.6300 mg | INHALATION_SOLUTION | Freq: Four times a day (QID) | RESPIRATORY_TRACT | Status: DC

## 2015-01-16 MED ORDER — CHLORHEXIDINE GLUCONATE 0.12 % MT SOLN
15.0000 mL | Freq: Two times a day (BID) | OROMUCOSAL | Status: DC
  Administered 2015-01-16 – 2015-01-19 (×7): 15 mL via OROMUCOSAL
  Filled 2015-01-16 (×7): qty 15

## 2015-01-16 MED ORDER — MICAFUNGIN SODIUM 50 MG IV SOLR
100.0000 mg | Freq: Every day | INTRAVENOUS | Status: DC
  Administered 2015-01-16 – 2015-01-31 (×16): 100 mg via INTRAVENOUS
  Filled 2015-01-16 (×20): qty 100

## 2015-01-16 MED ORDER — CHLORHEXIDINE GLUCONATE 0.12 % MT SOLN: 15.0000 mL | Freq: Two times a day (BID) | OROMUCOSAL | Status: DC

## 2015-01-16 MED ORDER — TRACE MINERALS CR-CU-F-FE-I-MN-MO-SE-ZN IV SOLN
INTRAVENOUS | Status: DC
  Filled 2015-01-16: qty 960

## 2015-01-16 MED ORDER — PIPERACILLIN-TAZOBACTAM 3.375 G IVPB
INTRAVENOUS | Status: AC
  Filled 2015-01-16: qty 50

## 2015-01-16 MED ORDER — CALCIUM CHLORIDE 10 % IV SOLN
1.0000 g | INTRAVENOUS | Status: AC
  Administered 2015-01-16: 1 g via INTRAVENOUS
  Filled 2015-01-16: qty 10

## 2015-01-16 MED ORDER — PROPOFOL 10 MG/ML IV EMUL
0.0000 ug/kg/min | INTRAVENOUS | Status: DC
  Administered 2015-01-16 – 2015-01-17 (×3): 30 ug/kg/min via INTRAVENOUS
  Filled 2015-01-16 (×2): qty 100

## 2015-01-16 MED ORDER — HYDROCORTISONE NA SUCCINATE PF 100 MG IJ SOLR
50.0000 mg | Freq: Four times a day (QID) | INTRAMUSCULAR | Status: DC
  Administered 2015-01-16 – 2015-01-20 (×16): 50 mg via INTRAVENOUS
  Filled 2015-01-16 (×22): qty 1

## 2015-01-16 MED ORDER — IOHEXOL 300 MG/ML  SOLN
25.0000 mL | INTRAMUSCULAR | Status: AC
  Administered 2015-01-16 – 2015-01-17 (×2): 25 mL via ORAL

## 2015-01-16 MED ORDER — NOREPINEPHRINE BITARTRATE 1 MG/ML IV SOLN
0.0000 ug/min | INTRAVENOUS | Status: DC
  Administered 2015-01-16: 40 ug/min via INTRAVENOUS
  Administered 2015-01-17: 20 ug/min via INTRAVENOUS
  Administered 2015-01-17: 28 ug/min via INTRAVENOUS
  Administered 2015-01-17: 40 ug/min via INTRAVENOUS
  Administered 2015-01-18 – 2015-01-19 (×2): 6 ug/min via INTRAVENOUS
  Filled 2015-01-16 (×6): qty 16

## 2015-01-16 MED ORDER — MIDAZOLAM HCL 2 MG/2ML IJ SOLN
2.0000 mg | INTRAMUSCULAR | Status: AC | PRN
  Administered 2015-01-18 (×3): 2 mg via INTRAVENOUS
  Filled 2015-01-16 (×3): qty 2

## 2015-01-16 MED ORDER — HEPARIN SODIUM (PORCINE) 5000 UNIT/ML IJ SOLN: 5000.0000 [IU] | Freq: Three times a day (TID) | INTRAMUSCULAR | Status: DC

## 2015-01-16 MED ORDER — PROPOFOL 10 MG/ML IV EMUL
5.0000 ug/kg/min | INTRAVENOUS | Status: DC
  Administered 2015-01-16: 10 ug/kg/min via INTRAVENOUS
  Administered 2015-01-16: 5 ug/kg/min via INTRAVENOUS
  Filled 2015-01-16: qty 100

## 2015-01-16 MED ORDER — CALCIUM CARBONATE-VITAMIN D 500-200 MG-UNIT PO TABS
1.0000 | ORAL_TABLET | Freq: Every day | ORAL | Status: DC
  Filled 2015-01-16 (×2): qty 1

## 2015-01-16 MED ORDER — PROPOFOL 10 MG/ML IV EMUL
INTRAVENOUS | Status: AC
  Administered 2015-01-16: 30 ug/kg/min via INTRAVENOUS
  Filled 2015-01-16: qty 100

## 2015-01-16 MED ORDER — SODIUM POLYSTYRENE SULFONATE 15 GM/60ML PO SUSP
30.0000 g | Freq: Once | ORAL | Status: AC
  Administered 2015-01-16: 30 g via ORAL
  Filled 2015-01-16: qty 120

## 2015-01-16 MED ORDER — OMEGA-3-ACID ETHYL ESTERS 1 G PO CAPS
1.0000 g | ORAL_CAPSULE | Freq: Three times a day (TID) | ORAL | Status: DC
  Administered 2015-01-16: 1 g via ORAL
  Filled 2015-01-16: qty 1

## 2015-01-16 MED ORDER — SODIUM POLYSTYRENE SULFONATE 15 GM/60ML PO SUSP
30.0000 g | Freq: Once | ORAL | Status: AC
  Administered 2015-01-17: 30 g via RECTAL
  Filled 2015-01-16: qty 120

## 2015-01-16 MED ORDER — SODIUM CHLORIDE 0.9 % IV SOLN: 250.0000 mL | INTRAVENOUS | Status: DC | PRN

## 2015-01-16 MED ORDER — MIDAZOLAM HCL 2 MG/2ML IJ SOLN
2.0000 mg | INTRAMUSCULAR | Status: DC | PRN
  Administered 2015-01-17: 2 mg via INTRAVENOUS
  Filled 2015-01-16: qty 2

## 2015-01-16 MED ORDER — HYDROMORPHONE HCL 1 MG/ML IJ SOLN
0.5000 mg | INTRAMUSCULAR | Status: DC | PRN
Start: 2015-01-16 — End: 2015-01-16

## 2015-01-16 MED ORDER — PROPOFOL 10 MG/ML IV BOLUS
INTRAVENOUS | Status: DC | PRN
  Administered 2015-01-16: 100 mg via INTRAVENOUS

## 2015-01-16 MED ORDER — VANCOMYCIN HCL IN DEXTROSE 1-5 GM/200ML-% IV SOLN
1000.0000 mg | Freq: Three times a day (TID) | INTRAVENOUS | Status: DC
  Administered 2015-01-16: 1000 mg via INTRAVENOUS
  Filled 2015-01-16 (×3): qty 200

## 2015-01-16 MED ORDER — MIDAZOLAM HCL 2 MG/2ML IJ SOLN
INTRAMUSCULAR | Status: AC
  Filled 2015-01-16: qty 2

## 2015-01-16 MED ORDER — PANTOPRAZOLE SODIUM 40 MG IV SOLR
40.0000 mg | Freq: Two times a day (BID) | INTRAVENOUS | Status: DC
  Administered 2015-01-16 – 2015-01-28 (×25): 40 mg via INTRAVENOUS
  Filled 2015-01-16 (×27): qty 40

## 2015-01-16 MED ORDER — SODIUM CHLORIDE 0.9 % IV SOLN
INTRAVENOUS | Status: DC
  Administered 2015-01-16 – 2015-01-17 (×2): via INTRAVENOUS

## 2015-01-16 MED ORDER — ALBUTEROL SULFATE (2.5 MG/3ML) 0.083% IN NEBU
2.5000 mg | INHALATION_SOLUTION | RESPIRATORY_TRACT | Status: DC | PRN
  Filled 2015-01-16: qty 3

## 2015-01-16 MED ORDER — PANTOPRAZOLE SODIUM 40 MG IV SOLR: 40.0000 mg | Freq: Every day | INTRAVENOUS | Status: DC

## 2015-01-16 MED ORDER — FERROUS GLUCONATE 324 (38 FE) MG PO TABS
324.0000 mg | ORAL_TABLET | Freq: Every day | ORAL | Status: DC
  Filled 2015-01-16 (×2): qty 1

## 2015-01-16 MED ORDER — SODIUM BICARBONATE 8.4 % IV SOLN
50.0000 meq | INTRAVENOUS | Status: AC
  Administered 2015-01-16: 50 meq via INTRAVENOUS
  Filled 2015-01-16: qty 50

## 2015-01-16 MED ORDER — SODIUM BICARBONATE 8.4 % IV SOLN
INTRAVENOUS | Status: AC
  Administered 2015-01-16: 50 meq via INTRAVENOUS
  Filled 2015-01-16: qty 50

## 2015-01-16 NOTE — Progress Notes (Signed)
  ADDENDUM:  The patient's follow-up ABG worsened with a pH of 7.2, PCO2 of 38.5, and a PO2 of 68.2 on a nonrebreather. He was complaining of worsening shortness of breath and had become more tachycardic. For this reason, he was intubated and mechanically ventilated per anesthesiology. Dr. Juanetta Gosling, pulmonologist, was informed and added ventilator orders per protocol. Given the patient's comorbid conditions including end-stage Crohn's disease and hepatic cirrhosis, I have asked CCM at [facility] to assume care of this critically ill patient  for ongoing treatment. Intensivist, Dr. Delton Coombes has reviewed the patient's chart and has agreed to accept the patient in transfer.  The follow-up chest x-ray revealed progression of bilateral opacities worrisome for ARDS. Per respiratory therapy, the ET tube is being progressed several more centimeters.  The patient has been made nothing by mouth. All of his oral medications have been discontinued, including sucralfate, Protonix, potassium, metolazone, oxycodone calcium, ferrous sulfate, spironolactone, prednisone, and acidophilus. Protonix was transitioned to IV. We'll order TPN as the patient is chronically treated with TPN. We'll start IV Solu-Cortef, stress dosing.  The above was discussed with the patient's wife who is in agreement with transfer.

## 2015-01-16 NOTE — Care Management Note (Signed)
  Page 2 of 2   01/31/2015     11:41:40 AM CARE MANAGEMENT NOTE 01/31/2015  Patient:  David [name], David [name]   Account Number:  [id number]  Date Initiated:  01/16/2015  Documentation initiated by:  CHILDRESS,JESSICA  Subjective/Objective Assessment:   Pt admitted with aspiration pneumonia. Pt is from home, lives with wife. Pt has [facility] for TPN. Pt is working hard to breath but says "nothing has changed" since discharge on 1/19.     Action/Plan:   Pt plans to discharge home with resumption of HH services at discharge. Will continue to follow for CM needs.   Anticipated DC Date:  01/31/2015   Anticipated DC Plan:  HOME W HOME HEALTH SERVICES      DC Planning Services  CM consult      [facility] Choice  Resumption Of Svcs/PTA Provider   Choice offered to / List presented to:  C-1 Patient   DME arranged  [facility]      DME agency  [facility]     HH arranged  HH-1 RN  HH-2 PT      [facility] agency  [facility]   Status of service:  In process, will continue to follow Medicare Important Message given?  YES (If response is "NO", the following Medicare IM given date fields will be blank) Date Medicare IM given:  01/30/2015 Medicare IM given by:  Ronny Flurry Date Additional Medicare IM given:   Additional Medicare IM given by:    Discharge Disposition:  HOME W HOME HEALTH SERVICES  Per UR Regulation:  Reviewed for med. necessity/level of care/duration of stay  If discussed at Long Length of Stay Meetings, dates discussed:   01/24/2015  01/31/2015    Comments:  ContactStevie, Poisson Spouse [phone]    [name] Mother [phone]  [phone]   01/28/15- 1700- Donn Pierini RN, BSN [phone] Per ID- repeat BC drawn on 2/13- awaiting results- will need Micafungin until 2/23- TEE done today and was clean. awaiting ok from ID for new PICC placement-  01/25/15- 1600- Donn Pierini RN, BSN [phone] Pt was active with  [facility] for TPN prior to admission, has had to have all indwelling lines removed for fungemia- will have repeat BC drawn soon- will need new line placed prior to discharge for TPN and long term abx- currently has PIV, per [facility] mtg on 2/11- pt may need to go to STSNF for abx due to cost- [facility] to check into cost at home - will see how pt progresses wil also need TEE prior to discharge and f/u recommendations from ID. will f/u next week to see d/c plan back home with [facility] vs need for STSNF due to cost of medications.  01-17-15 9:20am Avie Arenas, RNBSN [phone] Transferred from [facility] - intubated - started on CRRT -  ?? abd source for sepsis.  On pressors.  01/16/2015 1100 Kathyrn Sheriff, RN, MSN, CM Pt intubated and being transferred to [facility]. 01/16/2015 1100 Kathyrn Sheriff, RN, MSN, CM

## 2015-01-16 NOTE — Progress Notes (Addendum)
[facility] PROGRESS NOTE  David [name]:[id number] DOB: [birth year] DOA: 01/15/2015 PCP: [name], MD    Code Status: Full code Family Communication: Family not available; will discuss with wife when available. Disposition Plan: Discharge to home when clinically appropriate.   Consultants:  Pulmonology, pending.  Procedures:  BiPAP, tried.  Antibiotics:  Vancomycin 2/2>  Zosyn 2/2>  Clindamycin 1.  HPI/Subjective:  The patient is sitting up in bed, short of breath. He denies chest pain, but acknowledges shortness of breath which he says is no worse than when he came in. He is complaining of abdominal pain which he says is chronic. He denies nausea vomiting.  Objective: Filed Vitals:   01/16/15 0900  BP: 87/47  Pulse: 122  Temp:   Resp: 25    Intake/Output Summary (Last 24 hours) at 01/16/15 1010 Last data filed at 01/16/15 0730  Gross per 24 hour  Intake    540 ml  Output   1275 ml  Net   -735 ml   Filed Weights   01/15/15 1416 01/16/15 0500  Weight: 87.091 kg (192 lb) 88.7 kg (195 lb 8.8 oz)    Exam:   General:  Debilitated appearing 39 year old man who is dyspneic.  Cardiovascular: S1, S2, with tachycardia.  Respiratory: Bilateral crackles; breathing labored.  Abdomen: Site around jejunostomy tube without significant edema or drainage; well-healed vertical scar; mildly distended; mildly tender diffusely.  Musculoskeletal/extremities: No acute hot red joints; 1+ bilateral lower extremity edema.  Neurologic: He is alert and oriented 3. Cranial nerves II through XII are grossly intact.   Data Reviewed: Basic Metabolic Panel:  Recent Labs Lab 01/15/15 1440 01/16/15 0442  NA 132* 135  K 4.5 4.7  CL 104 104  CO2 20 22  GLUCOSE 176* 85  BUN 62* 62*  CREATININE 1.38* 1.24  CALCIUM 7.3* 7.2*   Liver Function Tests:  Recent Labs Lab 01/15/15 1440 01/16/15 0442  AST 164* 162*  ALT 94* 97*  ALKPHOS 125* 139*  BILITOT  1.9* 3.3*  PROT 5.6* 5.4*  ALBUMIN 2.7* 2.6*   No results for input(s): LIPASE, AMYLASE in the last 168 hours. No results for input(s): AMMONIA in the last 168 hours. CBC:  Recent Labs Lab 01/15/15 1440 01/16/15 0442  WBC 10.4 9.1  NEUTROABS 10.0*  --   HGB 9.0* 9.5*  HCT 29.2* 30.9*  MCV 82.5 82.8  PLT 76* 57*   Cardiac Enzymes: No results for input(s): CKTOTAL, CKMB, CKMBINDEX, TROPONINI in the last 168 hours. BNP (last 3 results) No results for input(s): BNP in the last 8760 hours.  ProBNP (last 3 results) No results for input(s): PROBNP in the last 8760 hours.  CBG: No results for input(s): GLUCAP in the last 168 hours.  Recent Results (from the past 240 hour(s))  Blood culture (routine x 2)     Status: None (Preliminary result)   Collection Time: 01/15/15  3:07 PM  Result Value Ref Range Status   Specimen Description BLOOD LEFT ANTECUBITAL  Final   Special Requests BOTTLES DRAWN AEROBIC AND ANAEROBIC 6CC  Final   Culture NO GROWTH 1 DAY  Final   Report Status PENDING  Incomplete  Blood culture (routine x 2)     Status: None (Preliminary result)   Collection Time: 01/15/15  3:12 PM  Result Value Ref Range Status   Specimen Description BLOOD LEFT ARM  Final   Special Requests BOTTLES DRAWN AEROBIC AND ANAEROBIC 8CC  Final   Culture NO GROWTH 1 DAY  Final   Report Status PENDING  Incomplete  MRSA PCR Screening     Status: None   Collection Time: 01/15/15  6:33 PM  Result Value Ref Range Status   MRSA by PCR NEGATIVE NEGATIVE Final    Comment:        The GeneXpert MRSA Assay (FDA approved for NASAL specimens only), is one component of a comprehensive MRSA colonization surveillance program. It is not intended to diagnose MRSA infection nor to guide or monitor treatment for MRSA infections.      Studies: Dg Chest 2 View  01/15/2015   CLINICAL DATA:  Pneumonia  EXAM: CHEST  2 VIEW  COMPARISON:  12/26/2014  FINDINGS: Right upper extremity PICC, tip at the  distal SVC.  Chronically low lung volumes with reticular nodular densities at the right base. There is new patchy airspace opacity in the bilateral anterior lungs. No edema, effusion, or pneumothorax. Normal heart size and mediastinal contours.  IMPRESSION: Bilateral pneumonia.   Electronically Signed   By: Tiburcio Pea M.D.   On: 01/15/2015 14:39    Scheduled Meds: . ALIGN  4 mg Oral Daily  . antiseptic oral rinse  7 mL Mouth Rinse BID  . calcium-vitamin D  1 tablet Oral BID  . feeding supplement (ENSURE COMPLETE)  237 mL Oral BID BM  . ferrous gluconate  324 mg Oral TID  . guaiFENesin  1,200 mg Oral BID  . metolazone  5 mg Oral BID  . omega-3 acid ethyl esters  1 g Oral TID  . oxyCODONE  50 mg Oral QID  . pantoprazole  40 mg Oral BID  . piperacillin-tazobactam (ZOSYN)  IV  3.375 g Intravenous 3 times per day  . potassium chloride  40 mEq Oral TID  . predniSONE  20 mg Oral Q breakfast  . spironolactone  25 mg Oral BID  . sucralfate  1 g Oral TID AC  . 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 Acute respiratory failure with hypoxia, secondary to healthcare associated pneumonia. The patient was hospitalized and discharged on 01/01/15 recently for treatment of aspiration pneumonia. He was discharged on Cipro. Chest x-ray on admission revealed chronic reticular nodular densities at the right lung base and new patchy airspace opacities bilaterally; no evidence of edema. He is afebrile and his white blood cell count is within normal limits. His lactic acid level was 2.7. The patient does have an  increase in work of breathing. ABG following admission on a nonrebreather revealed pH of 7.3/PCO2 of 40/PO2 of 148. We'll check another ABG and chest x-ray for interval findings. Low threshold for mechanical intubation/ventilation. Apparently the patient could not tolerate BiPAP. We'll continue vancomycin and Zosyn. Continue oxygen. Will add Xopenex nebulizer. Pulmonology consult pending.  End-stage Crohn's disease of the small and large bowel with previous fistulas/chronic abdominal pain. He has a chronic jejunostomy tube for decompression. He is on chronic steroid therapy, which is being continued. He is on chronic oxycodone which is being continued. LAD small dosing of when necessary IV hydromorphone. Apparently, the patient will follow-up with Dr. [name] for chronic pain management  per previous documentation. He is followed by GI at [facility] and apparently there is a consideration for a small bowel transplant.  Gastroparesis. Apparently this is contributing to his history of aspiration. We'll keep him nothing by mouth for now, primarily because of respiratory distress. Continue PPI and Carafate. Will restart Reglan when he is no longer nothing by mouth.  Hepatic cirrhosis with portal hypertension, thought to be secondary to methotrexate. Liver transaminases and total bilirubin are more elevated compared to last week. He does not appear to have encephalopathy, but this will be monitored. Will order an ammonia level tomorrow. We will ask GI to follow peripherally.  Chronic anasarca, secondary to cirrhosis associated with hypoalbuminemia Mild to moderate; no evidence of pulmonary edema. Continue metolazone and spironolactone.  Severe protein calorie malnutrition. He is on TPN chronically at home. We'll restart TPN and will ask pharmacy to dose.   Anemia of chronic disease and iron deficiency. His hemoglobin is stable. He was transfused 1 unit of packed red blood cells during the  previous hospitalization. Continue ferrous sulfate as ordered.  Thrombocytopenia. This is chronic and secondary to cirrhosis.  His vitamin B12 level was not deficient one month ago. Will order TSH for further evaluation. Continue to monitor.   We'll decrease pill burdened by decreasing iron to once daily, decreasing calcium to one stay, and discontinuing omega-3 fatty acid capsules.   Time spent: 45 minutes critical care time.    [facility]  [facility] Pager [phone]. If 7PM-7AM, please contact night-coverage at [email or web address], password [facility] 01/16/2015, 10:10 AM  LOS: 1 day

## 2015-01-16 NOTE — Progress Notes (Signed)
Pt arrived from care link with norepi at 30, propofol at 30. BP 80s/30-40s. Radiologist called with results of stat cxr: 12.3cm above carina. MD notified and ETT advanced with guidescope by PA Rahul Desai. Abdomen firm and distended. OG to LIS, G-tube to gravity drainage. PA Rahul at bedside for femoral Aline placement. CVP setup. Labs and blood cultures sent. UOP 10cc total for 3 hours. CCM MD aware. Will cont to monitor pt.

## 2015-01-16 NOTE — Progress Notes (Signed)
NUTRITION FOLLOW UP  Intervention:    -RD to follow for diet advancement and or nutrition support measures  Nutrition Dx:   Inadequate oral intake related to altered GI function as evidenced by NPO; ongoing   Goal:   Pt will meet >90% of estimated nutritional needs  Monitor:  Transition to PO diet/enteral feedings (if feasible), labs, weight changes, I/O's  Assessment:  Pt presents with dyspnea. Chest x-ray results possible aspiration pneumonia. During recent admission pt nutrition needs primarily met via TPN.  Hx: pt is 39 y.o. male with a history of Crohn disease with fistula, NASH-cirrhosis, anemia, hypertension, failure to thrive, severe malnutrition and on chronic steroids. Patient underwent exploratory laparotomy at [facility] on 11/05/2014 with plans of lysis adhesions and possible bowel resection. Patient has a gastrostomy tube placed percutaneously (2004).    Height: Ht Readings from Last 1 Encounters:  01/15/15 _0  (1.803 m)    Weight Status:   Wt Readings from Last 1 Encounters:  01/16/15 195 lb 8.8 oz (88.7 kg)   12/20/14 186 lb 8 oz (84.596 kg)   12/16/14 189 lb 2.5 oz (85.8 kg)   Re-estimated needs:  Kcal: 2100-2300 Protein: 96-106 grams Fluid: 2.1-2.3 L  Skin: no new issues  Diet Order:   NPO   Intake/Output Summary (Last 24 hours) at 01/16/15 0811 Last data filed at 01/16/15 0600  Gross per 24 hour  Intake    540 ml  Output   1100 ml  Net   -560 ml    Last BM: 01/14/2015   Labs:   Recent Labs Lab 01/15/15 1440 01/16/15 0442  NA 132* 135  K 4.5 4.7  CL 104 104  CO2 20 22  BUN 62* 62*  CREATININE 1.38* 1.24  CALCIUM 7.3* 7.2*  GLUCOSE 176* 85    CBG (last 3)  No results for input(s): GLUCAP in the last 72 hours.  Scheduled Meds: . ALIGN  4 mg Oral Daily  . antiseptic oral rinse  7 mL Mouth Rinse BID  . calcium-vitamin D  1 tablet Oral BID  . feeding supplement (ENSURE COMPLETE)  237 mL Oral BID BM  . ferrous  gluconate  324 mg Oral TID  . metolazone  5 mg Oral BID  . omega-3 acid ethyl esters  1 g Oral TID  . oxyCODONE  50 mg Oral QID  . pantoprazole  40 mg Oral BID  . piperacillin-tazobactam (ZOSYN)  IV  3.375 g Intravenous 3 times per day  . potassium chloride  40 mEq Oral TID  . predniSONE  20 mg Oral Q breakfast  . spironolactone  25 mg Oral BID  . sucralfate  1 g Oral TID AC  . vancomycin  1,000 mg Intravenous Q12H    Continuous Infusions:    Colman Cater MS,RD,CSG,LDN Office: [phone] Pager: [phone]

## 2015-01-16 NOTE — Procedures (Signed)
Arterial Catheter Insertion Procedure Note David [name] [id number] Mar 14, 1976  Procedure: Insertion of Arterial Catheter  Indications: Blood pressure monitoring and Frequent blood sampling  Procedure Details Consent: Unable to obtain consent because of altered level of consciousness. Time Out: Verified patient identification, verified procedure, site/side was marked, verified correct patient position, special equipment/implants available, medications/allergies/relevent history reviewed, required imaging and test results available.  Performed  Maximum sterile technique was used including antiseptics, cap, gloves, gown, hand hygiene, mask and sheet. Skin prep: Chlorhexidine; local anesthetic administered 20 gauge catheter was inserted into right femoral artery using the Seldinger technique.  Evaluation Blood flow good; BP tracing good. Complications: No apparent complications.  Procedure performed under direct ultrasound guidance for real time vessel cannulation.      Rutherford Guys, [place] [facility] Pgr: [phone]  or [phone] 01/16/2015, 8:02 PM

## 2015-01-16 NOTE — Progress Notes (Signed)
ANTIBIOTIC CONSULT NOTE - INITIAL  Pharmacy Consult for Vancomycin and Zosyn Indication: HCAP  Allergies  Allergen Reactions  . Remicade [Infliximab]     Dr Karilyn Cota states previous respiratory arrest not related to remicade. Dr Karilyn Cota states remicade not a drug related allergy. 07-07-2013 at 1025 rapid response called to PACU- Patient difficulty breathing after infusion of Remicade infusing. Do NOT Give Remicade!  . Fentanyl And Related Itching    Swelling, Redness  . Alfentanil Itching    Swelling, Redness  . Wellbutrin [Bupropion] Nausea And Vomiting  . Morphine And Related Hives    Patient Measurements: Height:  (180.3 cm) Weight: 201 lb 1 oz (91.2 kg) IBW/kg (Calculated) : 75.3  Vital Signs: Temp: 98.1 F (36.7 C) (02/03 1648) Temp Source: Oral (02/03 1648) BP: 86/39 mmHg (02/03 1700) Pulse Rate: 113 (02/03 1700) Intake/Output from previous day: 02/02 0701 - 02/03 0700 In: 540 [P.O.:240; IV Piggyback:300] Out: 1100 [Urine:1100] Intake/Output from this shift: Total I/O In: 142.4 [I.V.:92.4; IV Piggyback:50] Out: 460 [Urine:225; Emesis/NG output:85; Drains:150]  Labs:  Recent Labs  01/15/15 1440 01/16/15 0442  WBC 10.4 9.1  HGB 9.0* 9.5*  PLT 76* 57*  CREATININE 1.38* 1.24   Estimated Creatinine Clearance: 93.3 mL/min (by C-G formula based on Cr of 1.24). No results for input(s): VANCOTROUGH, VANCOPEAK, VANCORANDOM, GENTTROUGH, GENTPEAK, GENTRANDOM, TOBRATROUGH, TOBRAPEAK, TOBRARND, AMIKACINPEAK, AMIKACINTROU, AMIKACIN in the last 72 hours.   Microbiology: Recent Results (from the past 720 hour(s))  Wound culture     Status: None   Collection Time: 12/21/14  6:44 AM  Result Value Ref Range Status   Specimen Description G/T SITE  Final   Special Requests Normal  Final   Gram Stain   Final    FEW WBC PRESENT,BOTH PMN AND MONONUCLEAR NO SQUAMOUS EPITHELIAL CELLS SEEN NO ORGANISMS SEEN Performed at [facility]    Culture   Final    MULTIPLE  ORGANISMS PRESENT, NONE PREDOMINANT Note: NO STAPHYLOCOCCUS AUREUS ISOLATED NO GROUP A STREP (S.PYOGENES) ISOLATED Performed at [facility]    Report Status 12/24/2014 FINAL  Final  Culture, Urine     Status: None   Collection Time: 12/21/14  7:35 AM  Result Value Ref Range Status   Specimen Description URINE, CLEAN CATCH  Final   Special Requests Normal  Final   Colony Count   Final    2,000 COLONIES/ML Performed at [facility]    Culture   Final    INSIGNIFICANT GROWTH Performed at [facility]    Report Status 12/22/2014 FINAL  Final  Culture, blood (routine x 2)     Status: None   Collection Time: 12/21/14  8:26 AM  Result Value Ref Range Status   Specimen Description BLOOD LEFT ANTECUBITAL  Final   Special Requests BOTTLES DRAWN AEROBIC AND ANAEROBIC 10CC BOTTLES  Final   Culture NO GROWTH 5 DAYS  Final   Report Status 12/26/2014 FINAL  Final  Culture, blood (routine x 2)     Status: None   Collection Time: 12/21/14  8:31 AM  Result Value Ref Range Status   Specimen Description BLOOD LEFT HAND  Final   Special Requests BOTTLES DRAWN AEROBIC ONLY 6CC BOTTLE  Final   Culture NO GROWTH 5 DAYS  Final   Report Status 12/26/2014 FINAL  Final  Clostridium Difficile by PCR     Status: None   Collection Time: 12/21/14  1:56 PM  Result Value Ref Range Status   C difficile by  pcr NEGATIVE NEGATIVE Final  Wound culture     Status: None   Collection Time: 12/24/14  2:30 PM  Result Value Ref Range Status   Specimen Description WOUND GASTROSTOMY TUBE SITE  Final   Special Requests NONE  Final   Gram Stain   Final    RARE WBC PRESENT, PREDOMINANTLY MONONUCLEAR NO SQUAMOUS EPITHELIAL CELLS SEEN NO ORGANISMS SEEN Performed at [facility]    Culture   Final    RARE PSEUDOMONAS AERUGINOSA Performed at [facility]    Report Status 12/28/2014 FINAL  Final   Organism ID, Bacteria PSEUDOMONAS AERUGINOSA  Final      Susceptibility    Pseudomonas aeruginosa - MIC*    CEFEPIME 8 SENSITIVE Sensitive     CEFTAZIDIME 4 SENSITIVE Sensitive     CIPROFLOXACIN <=0.25 SENSITIVE Sensitive     GENTAMICIN 8 INTERMEDIATE Intermediate     IMIPENEM 1 SENSITIVE Sensitive     PIP/TAZO 8 SENSITIVE Sensitive     TOBRAMYCIN <=1 SENSITIVE Sensitive     * RARE PSEUDOMONAS AERUGINOSA  Clostridium Difficile by PCR     Status: None   Collection Time: 12/26/14 10:16 PM  Result Value Ref Range Status   C difficile by pcr NEGATIVE NEGATIVE Final  Wound culture     Status: None   Collection Time: 12/31/14 12:54 AM  Result Value Ref Range Status   Specimen Description WOUND ABDOMEN  Final   Special Requests IMMUNE:COMPROMISED  Final   Gram Stain   Final    RARE WBC PRESENT, PREDOMINANTLY MONONUCLEAR NO SQUAMOUS EPITHELIAL CELLS SEEN RARE GRAM POSITIVE COCCI IN PAIRS IN CLUSTERS FEW YEAST Performed at [facility]    Culture   Final    MULTIPLE ORGANISMS PRESENT, NONE PREDOMINANT Note: NO GROUP A STREP (S.PYOGENES) ISOLATED NO STAPHYLOCOCCUS AUREUS ISOLATED Performed at [facility]    Report Status 01/03/2015 FINAL  Final  Blood culture (routine x 2)     Status: None (Preliminary result)   Collection Time: 01/15/15  3:07 PM  Result Value Ref Range Status   Specimen Description BLOOD LEFT ANTECUBITAL  Final   Special Requests BOTTLES DRAWN AEROBIC AND ANAEROBIC 6CC  Final   Culture NO GROWTH 1 DAY  Final   Report Status PENDING  Incomplete  Blood culture (routine x 2)     Status: None (Preliminary result)   Collection Time: 01/15/15  3:12 PM  Result Value Ref Range Status   Specimen Description BLOOD LEFT ARM  Final   Special Requests BOTTLES DRAWN AEROBIC AND ANAEROBIC 8CC  Final   Culture NO GROWTH 1 DAY  Final   Report Status PENDING  Incomplete  MRSA PCR Screening     Status: None   Collection Time: 01/15/15  6:33 PM  Result Value Ref Range Status   MRSA by PCR NEGATIVE NEGATIVE Final    Comment:        The  GeneXpert MRSA Assay (FDA approved for NASAL specimens only), is one component of a comprehensive MRSA colonization surveillance program. It is not intended to diagnose MRSA infection nor to guide or monitor treatment for MRSA infections.     Medical History: Past Medical History  Diagnosis Date  . 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 Hepatomegaly 05/04/2011  . Fatty liver 05/04/2011    "stage III fatty liver fibrosis" (11/15/2012)  . GERD (gastroesophageal reflux disease)   .  Chronic liver disease     /notes 11/15/2012  . Pericarditis     [name] 11/15/2012  . Hypertension   . Pneumonia 1977  . Shortness of breath     "all the time right now" (11/15/2012)  . History of blood transfusion 2004  . History of stomach ulcers   . Duodenal ulcer   . Depression   . Kidney stones     bilaterally/notes 11/15/2012  . Hepatic fibrosis     [name] 11/15/2012  . Anemia   . Pericardial effusion 10/29/2012    moderate to large/notes 11/15/2012  . Anxiety   . Hepatitis   . Crohn's disease   . ED (erectile dysfunction)   . 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 calcium-vitamin D (OSCAL 500/200 D-3) 500-200 MG-UNIT per tablet Take 1 tablet by mouth 2 (two) times daily.   01/15/2015 at Unknown time  . dicyclomine (BENTYL) 10 MG capsule Take 1 capsule (10 mg total) by mouth 3 (three) times daily as needed (stomach cramps). (Patient taking differently: Take 10 mg by mouth daily as needed for spasms (stomach cramps). ) 90 capsule 5 OVER 30 DAYS at Unknown time  . ferrous gluconate (FERGON) 324 MG tablet Take 324 mg by mouth 3 (three) times daily.   01/15/2015 at Unknown time  . fish oil-omega-3 fatty acids 1000 MG capsule Take 1 g by mouth 3 (three) times daily.   01/15/2015 at Unknown time  .  metolazone (ZAROXOLYN) 5 MG tablet Take 1 tablet (5 mg total) by mouth 2 (two) times daily. 30 tablet 0 01/15/2015 at Unknown time  . oxyCODONE (ROXICODONE INTENSOL) 20 MG/ML concentrated solution Take 2.5 mLs (50 mg total) by mouth 4 (four) times daily. 300 mL 0 01/15/2015 at 800A  . pantoprazole (PROTONIX) 40 MG tablet Take 1 tablet (40 mg total) by mouth 2 (two) times daily. 60 tablet 5 01/15/2015 at Unknown time  . potassium chloride 20 MEQ/15ML (10%) SOLN Take 30 mLs (40 mEq total) by mouth 3 (three) times daily. 900 mL 0 01/15/2015 at Unknown time  . predniSONE (DELTASONE) 20 MG tablet Take 1 tablet (20 mg total) by mouth daily with breakfast. 30 tablet 0 01/15/2015 at Unknown time  . Probiotic Product (ALIGN) 4 MG CAPS Take 4 mg by mouth daily.    01/15/2015 at Unknown time  . promethazine (PHENERGAN) 25 MG tablet Take 1 tablet (25 mg total) by mouth 2 (two) times daily as needed. nausea 30 tablet 1 Past Week at Unknown time  . spironolactone (ALDACTONE) 25 MG tablet Take 1 tablet (25 mg total) by mouth 2 (two) times daily. 60 tablet 1 01/15/2015 at Unknown time  . sucralfate (CARAFATE) 1 G tablet Take 1 g by mouth 3 (three) times daily before meals.   01/15/2015 at Unknown time  . polyethylene glycol (MIRALAX / GLYCOLAX) packet Take 17 g by mouth daily as needed for mild constipation or moderate constipation.    OVE R30 DAYS   Assessment: 39 y.o. male presents to [facility] ED 2/2 with dyspnea/cough/fevers. Intubated on 2/3 and transferred to [facility]. Pt with recent hospitalization 1/2-1/19 for aspiration PNA. Noted that pt was sent home on TPN and full liquid diet. To continue Vancomycin and Zosyn for HCAP - started at [facility] on 2/2. Estimated CrCl ~90 ml/min. Afeb. WBC wnl.  Pt with therapeutic trough at  last admit on 1gm IV q8h  Goal of Therapy:  Vancomycin trough 15-20 mcg/ml  Plan:  Vancomycin 1gm IV q8h Zosyn 3.375gm IV q8h - each dose over 4 hours Will f/u renal function, pt's clinical  condition, micro data Vanc trough prn  Christoper Fabian, PharmD, BCPS Clinical pharmacist, pager [phone] 01/16/2015,5:22 PM

## 2015-01-16 NOTE — Progress Notes (Signed)
**Note De-identified  Obfuscation** Sputum collected, labeled and sent to lab. 

## 2015-01-16 NOTE — Telephone Encounter (Signed)
No action required.

## 2015-01-16 NOTE — Consult Note (Signed)
Reason for Consult:ARF and hyperkalemia Referring Physician: Lamonte Sakai, MD  David [name] is an 39 y.o. male.  HPI: 39 y.o. 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 He was found to be hypoxic requiring NRB and CXR suggestive of HCAP. He was admitted and his clinical course worsened and was transferred to [facility] for further escalation of care.  He had progressive respiratory failure requiring intubation/mechanical ventillation and developed SIRS requiring pressor support.  He was also noted to have oliguric ARF and worsening hyperkalemia and metabolic acidosis.  We were consulted for possible CVVHD to help correct his ARF and electrolyte abnormalities.  His trend in Scr is seen below.  Also of note, he had been on spironalactone and potassium supplementation prior to admission.  Trend in Creatinine:  CREATININE, SER  Date/Time Value Ref Range Status  01/16/2015 09:25 PM 2.29* 0.50 - 1.35 mg/dL Final  01/16/2015 07:04 PM 2.10* 0.50 - 1.35 mg/dL Final  01/16/2015 04:42 AM 1.24 0.50 - 1.35 mg/dL Final  01/15/2015 02:40 PM 1.38* 0.50 - 1.35 mg/dL Final  01/01/2015 06:02 AM 0.79 0.50 - 1.35 mg/dL Final  12/31/2014 06:44 AM 0.82 0.50 - 1.35 mg/dL Final  12/30/2014 06:27 AM 0.77 0.50 - 1.35 mg/dL Final  12/29/2014 06:52 AM 0.73 0.50 - 1.35 mg/dL Final  12/27/2014 05:38 AM 0.66 0.50 - 1.35 mg/dL Final  12/26/2014 06:38 AM 0.54 0.50 - 1.35 mg/dL Final  12/25/2014 06:07 AM 0.55 0.50 - 1.35 mg/dL Final  12/24/2014 06:49 AM 0.49* 0.50 - 1.35 mg/dL Final  12/23/2014 06:12 AM 0.50 0.50 - 1.35 mg/dL Final  12/22/2014 06:01 AM 0.53 0.50 - 1.35 mg/dL Final  12/21/2014 09:56 AM 0.51 0.50 - 1.35 mg/dL Final  12/20/2014 05:51 AM 0.54 0.50 - 1.35 mg/dL Final  12/19/2014 07:19 AM 0.62 0.50 - 1.35 mg/dL Final  12/18/2014 05:46 AM 0.64 0.50 - 1.35 mg/dL Final   12/17/2014 05:48 AM 0.61 0.50 - 1.35 mg/dL Final  12/16/2014 05:03 AM 0.66 0.50 - 1.35 mg/dL Final  12/15/2014 02:09 PM 0.68 0.50 - 1.35 mg/dL Final  12/14/2014 05:51 AM 0.60 0.50 - 1.35 mg/dL Final  12/13/2014 04:40 AM 0.61 0.50 - 1.35 mg/dL Final  12/12/2014 01:54 PM 0.72 0.50 - 1.35 mg/dL Final  12/12/2014 11:10 AM 0.78 0.50 - 1.35 mg/dL Final  12/12/2014 03:15 AM 0.76 0.50 - 1.35 mg/dL Final  12/11/2014 04:50 AM 0.97 0.50 - 1.35 mg/dL Final  12/10/2014 10:22 PM 1.16 0.50 - 1.35 mg/dL Final  12/10/2014 05:13 PM 1.11 0.50 - 1.35 mg/dL Final  12/10/2014 11:30 AM 1.23 0.50 - 1.35 mg/dL Final  12/10/2014 07:01 AM 1.26 0.50 - 1.35 mg/dL Final  12/09/2014 08:25 PM 1.42* 0.50 - 1.35 mg/dL Final  12/09/2014 01:42 PM 1.51* 0.50 - 1.35 mg/dL Final  12/04/2014 12:22 PM 1.62* 0.50 - 1.35 mg/dL Final  10/15/2014 04:26 PM 0.70 0.50 - 1.35 mg/dL Final  07/13/2014 11:50 AM 0.67 0.50 - 1.35 mg/dL Final  05/24/2014 11:52 AM 0.66 0.50 - 1.35 mg/dL Final  05/04/2014 09:36 AM 0.82 0.50 - 1.35 mg/dL Final  03/09/2014 04:59 AM 0.75 0.50 - 1.35 mg/dL Final  03/08/2014 04:53 AM 0.67 0.50 - 1.35 mg/dL Final  03/07/2014 07:05 PM 0.68 0.50 - 1.35 mg/dL Final  06/13/2013 02:13 PM 0.85 0.50 - 1.35 mg/dL Final  04/13/2013 04:45 AM 2.39* 0.50 - 1.35 mg/dL Final  04/12/2013 04:54 AM 2.39* 0.50 - 1.35 mg/dL Final  04/11/2013 04:08 PM 2.26* 0.50 - 1.35 mg/dL Final  11/22/2012 06:06 AM 0.66 0.50 - 1.35 mg/dL Final  11/20/2012 04:40 AM 0.60 0.50 - 1.35 mg/dL Final  11/19/2012 03:40 AM 0.96 0.50 - 1.35 mg/dL Final  11/18/2012 04:10 AM 1.01 0.50 - 1.35 mg/dL Final  11/17/2012 06:40 AM 0.79 0.50 - 1.35 mg/dL Final  11/16/2012 08:16 PM 0.76 0.50 - 1.35 mg/dL Final  11/16/2012 05:15 AM 0.92 0.50 - 1.35 mg/dL Final  11/15/2012 06:44 PM 1.20 0.50 - 1.35 mg/dL Final  10/29/2012 09:05 AM 0.70 0.50 - 1.35 mg/dL Final  11/11/2011 10:13 AM 0.82 0.50 - 1.35 mg/dL Final    PMH:   Past Medical History  Diagnosis Date  .  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 Hepatomegaly 05/04/2011  . Fatty liver 05/04/2011    "stage III fatty liver fibrosis" (11/15/2012)  . GERD (gastroesophageal reflux disease)   . Chronic liver disease     /notes 11/15/2012  . Pericarditis     Archie Endo 11/15/2012  . Hypertension   . Pneumonia 1977  . Shortness of breath     "all the time right now" (11/15/2012)  . History of blood transfusion 2004  . History of stomach ulcers   . Duodenal ulcer   . Depression   . Kidney stones     bilaterally/notes 11/15/2012  . Hepatic fibrosis     Ar[facility] Endo 11/15/2012  . Anemia   . Pericardial effusion 10/29/2012    moderate to large/notes 11/15/2012  . Anxiety   . Hepatitis   . Crohn's disease   . ED (erectile dysfunction)   . Cirrhosis     secondary to drug effect, +/- fatty liver    PSH:   Past Surgical History  Procedure Laterality Date  . Anal examination under anesthesia  02/11/2011    [name]  . Treatment fistula anal  07/03/11    This was a second surgery to repair Anal fistula  . Gastrojejunostomy  2004    "had hole cut in small intestines during endoscopy; had to have OR & leave me open 3 months" (11/15/2012)  . Cholecystectomy  ~ 2003  . Appendectomy  ~ 2003  . Video assisted thoracoscopy  11/17/2012    Procedure: VIDEO ASSISTED THORACOSCOPY;  Surgeon: Melrose Nakayama, MD;  Location: [place];  Service: Thoracic;  Laterality: Left;  drainage of left pleural effusion  . Pericardial window  11/17/2012    Procedure: PERICARDIAL WINDOW;  Surgeon: Melrose Nakayama, MD;  Location: [place];  Service: Thoracic;  Laterality: N/A;  . Esophagogastroduodenoscopy  01/06/2013    Procedure: ESOPHAGOGASTRODUODENOSCOPY (EGD);  Surgeon: Rogene Houston, MD;  [facility];  Service: Endoscopy;  Laterality: N/A;  11:15  . Esophagogastroduodenoscopy N/A 03/09/2014    Dr. Laural Golden: erosive/ulcerative reflux  esophagiits, portal gastropathy, moderate amount of bile and food debris in stomach precluding visualization of GJ anastomosis  . Gastrostomy tube placement  11/19/14    [facility]: 55 F APDL catheter. Checked again 12/23 via fluoroscopy and in suitable position  . Colonoscopy  Aug 2015    Dr. 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 Surveillance in 1 year  . Exploratory laparotomy  Nov 05, 2014    [facility], Dr. Morton Stall. 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 Picc line      Allergies:  Allergies  Allergen Reactions  . Remicade [Infliximab]     Dr Laural Golden states previous respiratory arrest not related to remicade. Dr Laural Golden states remicade not a drug related allergy. 07-07-2013 at 1025 rapid response called to PACU- Patient difficulty breathing after infusion of Remicade infusing. Do NOT Give Remicade!  . Fentanyl And Related Itching    Swelling, Redness  . Alfentanil Itching    Swelling, Redness  . Wellbutrin [Bupropion] Nausea And Vomiting  . Morphine And Related Hives    Medications:   Prior to Admission medications   Medication Sig Start Date End Date Taking? Authorizing Provider  calcium-vitamin D (OSCAL 500/200 D-3) 500-200 MG-UNIT per tablet Take 1 tablet by mouth 2 (two) times daily. 11/27/14  Yes Rogene Houston, MD  dicyclomine (BENTYL) 10 MG capsule Take 1 capsule (10 mg total) by mouth 3 (three) times daily as needed (stomach cramps). Patient taking differently: Take 10 mg by mouth daily as needed for spasms (stomach cramps).  06/18/14  Yes Rogene Houston, MD  ferrous gluconate (FERGON) 324 MG tablet Take 324 mg by mouth 3 (three) times daily. 11/21/14  Yes Historical Provider, MD  fish oil-omega-3 fatty acids 1000 MG capsule Take 1 g by mouth 3 (three) times daily. 02/09/12  Yes Rogene Houston, MD  metolazone (ZAROXOLYN) 5 MG tablet Take 1  tablet (5 mg total) by mouth 2 (two) times daily. 01/01/15  Yes Kathie Dike, MD  oxyCODONE (ROXICODONE INTENSOL) 20 MG/ML concentrated solution Take 2.5 mLs (50 mg total) by mouth 4 (four) times daily. 01/14/15  Yes Rogene Houston, MD  pantoprazole (PROTONIX) 40 MG tablet Take 1 tablet (40 mg total) by mouth 2 (two) times daily. 06/18/14  Yes Rogene Houston, MD  potassium chloride 20 MEQ/15ML (10%) SOLN Take 30 mLs (40 mEq total) by mouth 3 (three) times daily. 01/01/15  Yes Kathie Dike, MD  predniSONE (DELTASONE) 20 MG tablet Take 1 tablet (20 mg total) by mouth daily with breakfast. 01/01/15  Yes Kathie Dike, MD  Probiotic Product (ALIGN) 4 MG CAPS Take 4 mg by mouth daily.    Yes Historical Provider, MD  promethazine (PHENERGAN) 25 MG tablet Take 1 tablet (25 mg total) by mouth 2 (two) times daily as needed. nausea 11/27/14  Yes Rogene Houston, MD  spironolactone (ALDACTONE) 25 MG tablet Take 1 tablet (25 mg total) by mouth 2 (two) times daily. 01/01/15  Yes Kathie Dike, MD  sucralfate (CARAFATE) 1 G tablet Take 1 g by mouth 3 (three) times daily before meals.   Yes Historical Provider, MD  polyethylene glycol (MIRALAX / GLYCOLAX) packet Take 17 g by mouth daily as needed for mild constipation or moderate constipation.  10/26/14   Historical Provider, MD    Inpatient medications: . antiseptic oral rinse  7 mL Mouth Rinse QID  . chlorhexidine  15 mL Mouth Rinse BID  . hydrocortisone sod succinate (SOLU-CORTEF) inj  50 mg Intravenous Q6H  . micafungin (MYCAMINE) IV  100 mg Intravenous QHS  . midazolam      . pantoprazole (PROTONIX) IV  40 mg Intravenous Q12H  . piperacillin-tazobactam (ZOSYN)  IV  3.375 g Intravenous 3 times per day  . sodium polystyrene  30 g Rectal Once  . vancomycin  1,000 mg Intravenous Q8H    Discontinued Meds:   Medications Discontinued During This Encounter  Medication Reason  . 0.9 %  sodium chloride infusion   . enoxaparin (LOVENOX) injection 40 mg   .  acidophilus (RISAQUAD) capsule 1 capsule Formulary change  . fish oil-omega-3 fatty acids capsule 1 g Formulary change  . omega-3 acid ethyl esters (LOVAZA) capsule 1 g   . ferrous gluconate (FERGON) tablet 324 mg   . calcium-vitamin D (OSCAL WITH D) 500-200 MG-UNIT per tablet 1 tablet   . propofol (DIPRIVAN) 10 mg/ml infusion   . ALIGN CAPS 4 mg Formulary change  . acidophilus (RISAQUAD) capsule 1 capsule   . calcium-vitamin D (OSCAL WITH D) 500-200 MG-UNIT per tablet 1 tablet   . dicyclomine (BENTYL) capsule 10 mg   . feeding supplement (ENSURE COMPLETE) (ENSURE COMPLETE) liquid 237 mL   . ferrous gluconate (FERGON) tablet 324 mg   . metolazone (ZAROXOLYN) tablet 5 mg   . oxyCODONE (ROXICODONE INTENSOL) 20 MG/ML concentrated solution 50 mg   . pantoprazole (PROTONIX) EC tablet 40 mg   . polyethylene glycol (MIRALAX / GLYCOLAX) packet 17 g   . potassium chloride 20 MEQ/15ML (10%) solution 40 mEq   . predniSONE (DELTASONE) tablet 20 mg   . guaiFENesin (MUCINEX) 12 hr tablet 1,200 mg   . promethazine (PHENERGAN) tablet 25 mg   . sucralfate (CARAFATE) tablet 1 g   . HYDROmorphone (DILAUDID) injection 0.5 mg   . spironolactone (ALDACTONE) tablet 25 mg   . antiseptic oral rinse (CPC / CETYLPYRIDINIUM CHLORIDE 0.05%) solution 7 mL   . HYDROmorphone (DILAUDID) injection 0.5-1 mg   . levalbuterol (XOPENEX) nebulizer solution 0.63 mg   . piperacillin-tazobactam (ZOSYN) IVPB 3.375 g   . propofol (DIPRIVAN) 10 mg/ml infusion   . vancomycin (VANCOCIN) IVPB 1000 mg/200 mL premix   . hydrocortisone sodium succinate (SOLU-CORTEF) 100 MG injection 50 mg   . Marland KitchenTPN (CLINIMIX-E) Adult   . heparin injection 5,000 Units Lab parameters not met  . norepinephrine (LEVOPHED) 4 mg in dextrose 5 % 250 mL (0.016 mg/mL) infusion   . pantoprazole (PROTONIX) injection 40 mg Duplicate  . albuterol (PROVENTIL) (2.5 MG/3ML) 0.083% nebulizer solution 2.5 mg Duplicate  . chlorhexidine (PERIDEX) 0.12 % solution 15 mL    . antiseptic oral rinse (CPC / CETYLPYRIDINIUM CHLORIDE 0.05%) solution 7 mL     Social History:  reports that he quit smoking about 2 years ago. His smoking use included Cigarettes. He started smoking about 2 years ago. He has a 12 pack-year smoking history. His smokeless tobacco use includes Snuff. He reports that he does not drink alcohol or use illicit drugs.  Family History:   Family History  Problem Relation Age of Onset  . Diabetes Mother   . Diabetes Brother   . Healthy Daughter   . Healthy Son   . Colon cancer Neg Hx     Review of systems not obtained due to patient factors. Weight change:   Intake/Output Summary (Last 24 hours) at 01/16/15 2235 Last data filed at 01/16/15 2200  Gross per 24 hour  Intake  930.8 ml  Output   1075 ml  Net -144.2 ml   BP 90/36 mmHg  Pulse 113  Temp(Src) 98.5 F (36.9 C) (Oral)  Resp 35  Ht _0  (1.803 m)  Wt 91.2 kg (201 lb 1 oz)  BMI 28.05 kg/m2  SpO2 96% Filed Vitals:   01/16/15 2130 01/16/15 2145 01/16/15 2200 01/16/15 2215  BP: _1 90/36  Pulse: 103 106 108 113  Temp:      TempSrc:      Resp:  35 35 35 35  Height:      Weight:      SpO2: 97% 96% 96% 96%     General appearance: toxic and intubated/sedated Head: Normocephalic, without obvious abnormality, atraumatic Resp: rhonchi bilaterally Cardio: tachycardic, no rub GI: distended, +edema, decreased BS, tense Extremities: anasarca to his umbilicus and upper extremities  Labs: Basic Metabolic Panel:  Recent Labs Lab 01/15/15 1440 01/16/15 0442 01/16/15 1904 01/16/15 2019 01/16/15 2125  NA 132* 135 133*  --  131*  K 4.5 4.7 7.0* 7.1* 7.2*  CL 104 104 105  --  103  CO2 20 22 16*  --  14*  GLUCOSE 176* 85 79  --  93  BUN 62* 62* 68*  --  72*  CREATININE 1.38* 1.24 2.10*  --  2.29*  ALBUMIN 2.7* 2.6* 2.5*  --   --   CALCIUM 7.3* 7.2* 7.0*  --  6.8*   Liver Function Tests:  Recent Labs Lab 01/15/15 1440 01/16/15 0442 01/16/15 1904   AST 164* 162* 257*  ALT 94* 97* 108*  ALKPHOS 125* 139* 198*  BILITOT 1.9* 3.3* 4.9*  PROT 5.6* 5.4* 5.6*  ALBUMIN 2.7* 2.6* 2.5*    Recent Labs Lab 01/16/15 2019  LIPASE 24  AMYLASE 52   No results for input(s): AMMONIA in the last 168 hours. CBC:  Recent Labs Lab 01/15/15 1440 01/16/15 0442 01/16/15 1904  WBC 10.4 9.1 31.1*  NEUTROABS 10.0*  --   --   HGB 9.0* 9.5* 10.3*  HCT 29.2* 30.9* 33.2*  MCV 82.5 82.8 83.6  PLT 76* 57* 120*   PT/INR: _0 (inr:5) Cardiac Enzymes: ) Recent Labs Lab 01/16/15 1904  TROPONINI 0.07*   CBG:  Recent Labs Lab 01/16/15 1956  GLUCAP 92    Iron Studies: No results for input(s): IRON, TIBC, TRANSFERRIN, FERRITIN in the last 168 hours.  Xrays/Other Studies: Dg Chest 2 View  01/15/2015   CLINICAL DATA:  Pneumonia  EXAM: CHEST  2 VIEW  COMPARISON:  12/26/2014  FINDINGS: Right upper extremity PICC, tip at the distal SVC.  Chronically low lung volumes with reticular nodular densities at the right base. There is new patchy airspace opacity in the bilateral anterior lungs. No edema, effusion, or pneumothorax. Normal heart size and mediastinal contours.  IMPRESSION: Bilateral pneumonia.   Electronically Signed   By: Jorje Guild M.D.   On: 01/15/2015 14:39   Dg Chest Port 1 View  01/16/2015   CLINICAL DATA:  Encounter for intubation  EXAM: PORTABLE CHEST - 1 VIEW  COMPARISON:  01/16/2015  FINDINGS: Endotracheal tube tip is at the clavicular heads. A gastric suction tube enters the stomach. Stable right upper extremity PICC, tip at the upper cavoatrial junction.  Heart size and aortic contours remain normal. Bilateral airspace disease, asymmetric to the left, is unchanged. No increasing pleural fluid or air leak.  IMPRESSION: 1. Stable positioning of tubes and central line. 2. Unchanged bilateral pneumonia and/or noncardiogenic edema.   Electronically Signed   By: Jorje Guild M.D.   On: 01/16/2015 19:14   Dg Chest Port 1  View  01/16/2015   CLINICAL DATA:  Respiratory failure  EXAM: PORTABLE CHEST - 1 VIEW  COMPARISON:  January 16, 2015 study obtained earlier in the day  FINDINGS: Endotracheal tube tip is 12.3 cm above the carina. Nasogastric tube tip and side port are in the stomach. Central catheter tip is in the superior vena cava just superior to the cavoatrial junction. No pneumothorax. There is  widespread interstitial and patchy alveolar edema bilaterally, essentially stable. Heart is mildly enlarged with pulmonary venous hypertension. No adenopathy.  IMPRESSION: Tube and catheter positions as described without pneumothorax. Note that the endotracheal tube tip is 12.3 cm above the carina. The appearance is consistent with congestive heart failure, likely with superimposed ARDS. The appearance of the lungs and cardiac silhouette is essentially stable compared to earlier in the day.  Critical Value/emergent results were called by telephone at the time of interpretation on 01/16/2015 at 5:30 pm to Vickii Penna, RN, who verbally acknowledged these results.   Electronically Signed   By: Lowella Grip M.D.   On: 01/16/2015 17:30   Portable Chest Xray  01/16/2015   CLINICAL DATA:  Endotracheal tube placement.  EXAM: PORTABLE CHEST - 1 VIEW  COMPARISON:  Earlier the same date and 01/15/2015.  FINDINGS: 1222 hr. Endotracheal tube tip is just below the thoracic inlet, approximately 8 cm above the carina. Nasogastric tube projects below the diaphragm, tip not visualized. Right arm PICC appears unchanged near the SVC right atrial junction. There has been further worsening of the left greater than right bilateral airspace opacities with associated air bronchograms. There is no significant pleural effusion. The heart size and mediastinal contours are stable.  IMPRESSION: Endotracheal tube tip is just below the thoracic inlet. This could be advanced approximately 4 cm for more optimal positioning. Progression of bilateral airspace  opacities worrisome for ARDS.   Electronically Signed   By: Camie Patience M.D.   On: 01/16/2015 12:35   Dg Chest Port 1 View  01/16/2015   CLINICAL DATA:  Pneumonia.  EXAM: PORTABLE CHEST - 1 VIEW  COMPARISON:  01/15/2015 and 12/26/2014  FINDINGS: There has been marked progression of bilateral pulmonary infiltrates particularly in the left upper lobe. PICC appears in good position. Heart size and vascularity are normal. I suspect there are small bilateral pleural effusions.  IMPRESSION: Marked progression of extensive bilateral pulmonary infiltrates. Probable new effusions.   Electronically Signed   By: Rozetta Nunnery M.D.   On: 01/16/2015 11:16   Dg Chest Port 1v Same Day  01/16/2015   CLINICAL DATA:  Endotracheal tube repositioning.  Initial encounter.  EXAM: PORTABLE CHEST - 1 VIEW SAME DAY  COMPARISON:  Earlier the same day.  FINDINGS: 1258 hr. The endotracheal tube partially overlaps the nasogastric tube and is not optimally visualized, although appears to extend to approximately 3.1 cm above the carina. The right arm PICC and nasogastric tube are unchanged. Left greater than right airspace opacities have not significantly progressed. There is no evidence of pneumothorax.  IMPRESSION: Apparent repositioning of the endotracheal tube in the mid trachea (not well seen). No significant change in bilateral airspace opacities.   Electronically Signed   By: Camie Patience M.D.   On: 01/16/2015 13:38   Dg Abd Portable 1v  01/16/2015   CLINICAL DATA:  39 year old male with 1 day history of abdominal pain and distension  EXAM: PORTABLE ABDOMEN - 1 VIEW  COMPARISON:  Prior CT abdomen/pelvis 12/30/2014 ; prior acute abdominal series 113 2016  FINDINGS: Percutaneous gastrostomy tube projects over the right upper quadrant in similar position compared to prior imaging. Additionally, a nasogastric tube appears present in the more proximal aspect of the stomach. The bowel gas pattern is not obstructed on this single view. No  large free air although this is a supine study. A femoral vascular catheter projects over the right hip.  IMPRESSION: 1. Nonobstructed bowel gas pattern. 2. Nasogastric tube  and percutaneous gastrostomy tubes project over the stomach.   Electronically Signed   By: Jacqulynn Cadet M.D.   On: 01/16/2015 20:52     Assessment/Plan: 1.  ARF- now oliguric in setting of SIRS.  Given his severe metabolic acidosis and hyperkalemia will plan to initiate CVVHDF tonight,  I discussed the situation with his wife who was at the bedside and she is amenable to proceeding. 2. Hyperkalemia- as above, in setting of ARF, SIRS, as well as metabolic acidosis, spironalactone, and potassium supplementation.  Will plan for urgent CVVHD 3. Metabolic acidosis- due to above as well as possible ischemic bowel (has a h/o Crohns disease complicated by strictures and "frozen abdomen" per his wife) 4. VDRF- per PCCM 5. HCAP- antibiotics per PCCM, renal dose 6. SIRS- on pressors 7. Fungemia- now on diflucan 8. Cirrhosis- now with shock liver and anasarca. 9. Crohn's disease- with h/o adhesions and strictures, CT without evidence of free air or SBO, surgery has been consulted 10. Disposition- prognosis is grim in setting of underlying chronic conditions and now with multi-organ system failure.   [place] A 01/16/2015, 10:35 PM

## 2015-01-16 NOTE — Telephone Encounter (Signed)
Dr.Rehman was called @ [phone] no answer. He has been paged.

## 2015-01-16 NOTE — Progress Notes (Signed)
PCCM Interval Progress Note  CXR revealed that the ETT was ~ 12cm above the carina.  Glidescope used to visualize where ETT was directed and it was noted that cuff of tube was just at the distal edge of pt's true vocal cords.   With assistance of RT, soft stylet used to help advance ETT ~ 6 cm (now 26cmm at the lips). Good color change on ETCO2, bilateral breath sounds noted, improved O2 sats and improved tidal volumes on vent.  F/u CXR obtained and ETT now in good position, ~ 4.5 cm above carina.   Rutherford Guys, [place] - [facility] Pgr: [phone]  or [phone] 01/16/2015, 7:09 PM

## 2015-01-16 NOTE — Progress Notes (Signed)
Orders received for transfer to [facility] ICU; pt left via [facility]; belongings sent with pt's parents; report called to RN on 2S.

## 2015-01-16 NOTE — Progress Notes (Signed)
Critical ABG reported to Critical Care MD. RT attempting to titrate patient down to 6cc VT per ARDS protocol. ABG @ 7cc: pH 7.17, PCO2 42, PaO2 100, HCO3 15.5 (Vent Settings: VT 520, RR 35, FIO2 60, PEEP 10). Critical Care MD request we leave patient at 7cc and do not continue to titrate to 6cc. RT will monitor

## 2015-01-16 NOTE — Progress Notes (Signed)
Patient was unable to tolerate BIPAP. I also tried him in a CPAP mode and he could not tolerate that either. Patient placed back on High Flow Nasal Cannula 85% at 30 lpm. O2 sats 95% at this time. BIPAP left in room in case needed or patient's condition becomes worse.

## 2015-01-16 NOTE — H&P (Signed)
PULMONARY / CRITICAL CARE MEDICINE   Name: David [name] MRN: [id number] DOB: August 22, 1976    ADMISSION DATE:  01/15/2015 CONSULTATION DATE:  01/16/2015  REFERRING MD :  [facility]  CHIEF COMPLAINT:  Dyspnea  INITIAL PRESENTATION:  39 y.o. M who presented to [facility] ED on 2/2 with 2 days of dyspnea, cough, fevers.  He was found to be hypoxic requiring NRB and CXR suggestive of HCAP.  He was admitted   STUDIES:  CXR 2/2 >>> b/l opacities CXR 2/3 >>> marked progression of b/l infiltrates, probable new effusions CXR 2/3 >>> progression of b/l opacities worrisome for ARDS.   SIGNIFICANT EVENTS: 2/2 - admitted to [facility] 2/3 - intubated, transferred to [facility] ICU.  Started on ARDS protocol   HISTORY OF PRESENT ILLNESS:  Pt is encephalopathic; therefore, this HPI is obtained from chart review. David [name] is a 39 y.o. M with PMH as outlined below.  He presented to [facility] ED 2/2 with 2 day hx of dyspnea, cough, fevers.  He was found to be hypoxic and required NRB to maintain sats > 90%.  CXR suggested HCAP.  He was admitted to the SDU and treated with IV abx and supplemental O2. On 2/3, he tried BiPAP but could not tolerate it.  He was placed back on HFNC however repeat ABG later worse (7.2 / 38 / 68).  In addition, he endorsed worsening SOB and was more tachycardic.  Decision was made to intubate and provide mechanical ventilation.  CXR post intubation revealed progression of b/l opacities worrisome for ARDS. PCCM was called for transfer to [facility] for further management due to his multiple co-morbidities.  ED notes state the pt has been battling PNA for the past year due to aspiration.  Of note, he has Crohn's of the small and large bowel and is awaiting small bowel transplantation at [facility].  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  has past surgical history that includes Anal examination under anesthesia (02/11/2011); Treatment fistula anal (07/03/11); Gastrojejunostomy (2004); Cholecystectomy (~ 2003); Appendectomy (~ 2003); Video assisted thoracoscopy (11/17/2012); Pericardial window (11/17/2012); Esophagogastroduodenoscopy (01/06/2013); Esophagogastroduodenoscopy (N/A, 03/09/2014); Gastrostomy tube placement (11/19/14); Colonoscopy (Aug 2015); Exploratory laparotomy (Nov 05, 2014); and picc line. Prior to Admission medications   Medication Sig Start Date End Date Taking? Authorizing Provider  calcium-vitamin D (OSCAL 500/200 D-3) 500-200 MG-UNIT per tablet Take 1 tablet by mouth 2 (two) times daily. 11/27/14  Yes Malissa Hippo, MD  dicyclomine (BENTYL) 10 MG capsule Take 1 capsule (10 mg total) by mouth 3 (three) times daily as needed (stomach cramps). Patient taking differently: Take 10 mg by mouth daily as needed for spasms (stomach cramps).  06/18/14  Yes Malissa Hippo, MD  ferrous gluconate (FERGON) 324 MG tablet Take 324 mg by mouth 3 (three) times daily. 11/21/14  Yes Historical Provider, MD  fish oil-omega-3 fatty acids 1000 MG capsule Take 1 g by mouth 3 (three) times daily. 02/09/12  Yes  Malissa Hippo, MD  metolazone (ZAROXOLYN) 5 MG tablet Take 1 tablet (5 mg total) by mouth 2 (two) times daily. 01/01/15  Yes Erick Blinks, MD  oxyCODONE (ROXICODONE INTENSOL) 20 MG/ML concentrated solution Take 2.5 mLs (50 mg total) by  mouth 4 (four) times daily. 01/14/15  Yes Malissa Hippo, MD  pantoprazole (PROTONIX) 40 MG tablet Take 1 tablet (40 mg total) by mouth 2 (two) times daily. 06/18/14  Yes Malissa Hippo, MD  potassium chloride 20 MEQ/15ML (10%) SOLN Take 30 mLs (40 mEq total) by mouth 3 (three) times daily. 01/01/15  Yes Erick Blinks, MD  predniSONE (DELTASONE) 20 MG tablet Take 1 tablet (20 mg total) by mouth daily with breakfast. 01/01/15  Yes Erick Blinks, MD  Probiotic Product (ALIGN) 4 MG CAPS Take 4 mg by mouth daily.    Yes Historical Provider, MD  promethazine (PHENERGAN) 25 MG tablet Take 1 tablet (25 mg total) by mouth 2 (two) times daily as needed. nausea 11/27/14  Yes Malissa Hippo, MD  spironolactone (ALDACTONE) 25 MG tablet Take 1 tablet (25 mg total) by mouth 2 (two) times daily. 01/01/15  Yes Erick Blinks, MD  sucralfate (CARAFATE) 1 G tablet Take 1 g by mouth 3 (three) times daily before meals.   Yes Historical Provider, MD  polyethylene glycol (MIRALAX / GLYCOLAX) packet Take 17 g by mouth daily as needed for mild constipation or moderate constipation.  10/26/14   Historical Provider, MD   Allergies  Allergen Reactions  . Remicade [Infliximab]     Dr Karilyn Cota states previous respiratory arrest not related to remicade. Dr Karilyn Cota states remicade not a drug related allergy. 07-07-2013 at 1025 rapid response called to PACU- Patient difficulty breathing after infusion of Remicade infusing. Do NOT Give Remicade!  . Fentanyl And Related Itching    Swelling, Redness  . Alfentanil Itching    Swelling, Redness  . Wellbutrin [Bupropion] Nausea And Vomiting  . Morphine And Related Hives    FAMILY HISTORY:  Family History  Problem Relation Age of Onset  . Diabetes Mother   . Diabetes Brother   . Healthy Daughter   . Healthy Son   . Colon cancer Neg Hx     SOCIAL HISTORY:  reports that he quit smoking about 2 years ago. His smoking use included Cigarettes. He started smoking about 2 years ago.  He has a 12 pack-year smoking history. His smokeless tobacco use includes Snuff. He reports that he does not drink alcohol or use illicit drugs.  REVIEW OF SYSTEMS:  Unable to obtain as pt is intubated.  SUBJECTIVE:   VITAL SIGNS: Temp:  [97.3 F (36.3 C)-98.6 F (37 C)] 98.1 F (36.7 C) (02/03 1648) Pulse Rate:  [101-131] 113 (02/03 1700) Resp:  [16-37] 22 (02/03 1700) BP: (69-133)/(34-100) 86/39 mmHg (02/03 1700) SpO2:  [84 %-99 %] 93 % (02/03 1700) FiO2 (%):  [80 %-100 %] 100 % (02/03 1648) Weight:  [88.7 kg (195 lb 8.8 oz)-91.2 kg (201 lb 1 oz)] 91.2 kg (201 lb 1 oz) (02/03 1648) HEMODYNAMICS:   VENTILATOR SETTINGS: Vent Mode:  [-] PRVC FiO2 (%):  [80 %-100 %] 100 % Set Rate:  [16 bmp-28 bmp] 28 bmp Vt Set:  [600 mL] 600 mL PEEP:  [5 cmH20] 5 cmH20 Plateau Pressure:  [26 cmH20] 26 cmH20 INTAKE / OUTPUT: Intake/Output      02/02 0701 - 02/03 0700 02/03 0701 - 02/04 0700   P.O. 240    I.V. (mL/kg)  92.4 (  1)   IV Piggyback 300 50   Total Intake(mL/kg) 540 (6.1) 142.4 (1.6)   Urine (mL/kg/hr) 1100 175 (0.2)   Emesis/NG output  60 (0.1)   Stool  0 (0)   Total Output 1100 235   Net -560 -92.6        Urine Occurrence  1 x   Stool Occurrence  1 x     PHYSICAL EXAMINATION: General: Young, chronically ill appearing male, in NAD. Neuro: Sedated on vent. HEENT: [place]/AT. PERRL, sclerae anicteric.  ETT in place. Cardiovascular: Tachy, regular, no M/R/G.  Lungs: Respirations even and unlabored.  Coarse BS bilaterally. Abdomen: Old abdominal incisions noted.  PEG in place.  BS hypoactive, soft, NT/ND.  Musculoskeletal: No gross deformities, 1+ pitting edema / anasarca.  LUE PICC. Skin: Intact, warm, no rashes.  LABS:  CBC  Recent Labs Lab 01/15/15 1440 01/16/15 0442  WBC 10.4 9.1  HGB 9.0* 9.5*  HCT 29.2* 30.9*  PLT 76* 57*   Coag's No results for input(s): APTT, INR in the last 168 hours. BMET  Recent Labs Lab 01/15/15 1440 01/16/15 0442  NA 132* 135  K  4.5 4.7  CL 104 104  CO2 20 22  BUN 62* 62*  CREATININE 1.38* 1.24  GLUCOSE 176* 85   Electrolytes  Recent Labs Lab 01/15/15 1440 01/16/15 0442  CALCIUM 7.3* 7.2*   Sepsis Markers  Recent Labs Lab 01/15/15 1521  LATICACIDVEN 2.72*   ABG  Recent Labs Lab 01/15/15 1832 01/16/15 1055 01/16/15 1239  PHART 7.304* 7.237* 7.040*  PCO2ART 40.0 38.5 62.6*  PO2ART 148.0* 68.2* 217.0*   Liver Enzymes  Recent Labs Lab 01/15/15 1440 01/16/15 0442  AST 164* 162*  ALT 94* 97*  ALKPHOS 125* 139*  BILITOT 1.9* 3.3*  ALBUMIN 2.7* 2.6*   Cardiac Enzymes No results for input(s): TROPONINI, PROBNP in the last 168 hours. Glucose No results for input(s): GLUCAP in the last 168 hours.  Imaging Dg Chest 2 View  01/15/2015   CLINICAL DATA:  Pneumonia  EXAM: CHEST  2 VIEW  COMPARISON:  12/26/2014  FINDINGS: Right upper extremity PICC, tip at the distal SVC.  Chronically low lung volumes with reticular nodular densities at the right base. There is new patchy airspace opacity in the bilateral anterior lungs. No edema, effusion, or pneumothorax. Normal heart size and mediastinal contours.  IMPRESSION: Bilateral pneumonia.   Electronically Signed   By: Tiburcio Pea M.D.   On: 01/15/2015 14:39   Portable Chest Xray  01/16/2015   CLINICAL DATA:  Endotracheal tube placement.  EXAM: PORTABLE CHEST - 1 VIEW  COMPARISON:  Earlier the same date and 01/15/2015.  FINDINGS: 1222 hr. Endotracheal tube tip is just below the thoracic inlet, approximately 8 cm above the carina. Nasogastric tube projects below the diaphragm, tip not visualized. Right arm PICC appears unchanged near the SVC right atrial junction. There has been further worsening of the left greater than right bilateral airspace opacities with associated air bronchograms. There is no significant pleural effusion. The heart size and mediastinal contours are stable.  IMPRESSION: Endotracheal tube tip is just below the thoracic inlet. This  could be advanced approximately 4 cm for more optimal positioning. Progression of bilateral airspace opacities worrisome for ARDS.   Electronically Signed   By: Roxy Horseman M.D.   On: 01/16/2015 12:35   Dg Chest Port 1 View  01/16/2015   CLINICAL DATA:  Pneumonia.  EXAM: PORTABLE CHEST - 1 VIEW  COMPARISON:  01/15/2015 and 12/26/2014  FINDINGS: There has been marked progression of bilateral pulmonary infiltrates particularly in the left upper lobe. PICC appears in good position. Heart size and vascularity are normal. I suspect there are small bilateral pleural effusions.  IMPRESSION: Marked progression of extensive bilateral pulmonary infiltrates. Probable new effusions.   Electronically Signed   By: Geanie Cooley M.D.   On: 01/16/2015 11:16   Dg Chest Port 1v Same Day  01/16/2015   CLINICAL DATA:  Endotracheal tube repositioning.  Initial encounter.  EXAM: PORTABLE CHEST - 1 VIEW SAME DAY  COMPARISON:  Earlier the same day.  FINDINGS: 1258 hr. The endotracheal tube partially overlaps the nasogastric tube and is not optimally visualized, although appears to extend to approximately 3.1 cm above the carina. The right arm PICC and nasogastric tube are unchanged. Left greater than right airspace opacities have not significantly progressed. There is no evidence of pneumothorax.  IMPRESSION: Apparent repositioning of the endotracheal tube in the mid trachea (not well seen). No significant change in bilateral airspace opacities.   Electronically Signed   By: Roxy Horseman M.D.   On: 01/16/2015 13:38    ASSESSMENT / PLAN:  PULMONARY OETT 2/3 >>> A: Acute hypoxic respiratory failure HCAP Concern for ARDS P:   Full mechanical support with ARDS protocol at 6cc/kg and plat < 30 cm H2O. VAP bundle. SBT in AM if able. Abx per ID section. No role proning at this point in time. ABG and CXR in AM.  CARDIOVASCULAR RUE PICC A:  Septic Shock P:  Goal MAP > 65. Goal CVP 8 - 12. Levophed PRN. Stress  steroids. Troponin / Lactate. Hold outpatient metolazone, spironolactone.  RENAL A:   AKI - resolving Pseudohypocalcemia   P:   NS @ 50. BMP in AM.  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 Hepatic encephalopathy Chronic PEG tube GI prophylaxis Nutrition P:   Being followed at [facility] for possible small bowel transplant. LFT's, Ammonia in AM. Routine PEG tube care / maintenance. SUP: Pantoprazole. NPO. TPN per pharmacy. Hold outpatient prednisone, bentyl, probiotics, phernergan, sucralfate, miralax.  HEMATOLOGIC A:   Anemia of chronic disease Thrombocytopenia - appears chronic VTE Prophylaxis P:  Transfuse per usual ICU guidelines. Monitor platelets. SCD's only. CBC in AM. Hold outpatient ferrous gluconate.  INFECTIOUS A:   Septic Shock - suspect due to HCAP; however, consider GI process given prior GI hx P:   BCx2 2/2 >>> UCx 2/2 >>> Sputum Cx 2/2 >>> Abx: Vanc, start date 2/2, day 2/x. Abx: Zosyn, start date 2/2, day 2/x. ICU procalcitonin algorithm to limit abx exposure. If no improvement, consider CT abdomen / pelvis and pending results, possible CCS consult (though note that pt is being followed at [facility] for possible small bowel transplant).  ENDOCRINE A:   At risk relative AI given that chronic steroids At risk hyperglycemia P:   Stress steroids. ICU hyperglycemia protocol. TSH in AM.  NEUROLOGIC A:   Acute metabolic encephalopathy Chronic Pain P:   Sedation:  Propofol gtt / Midazolam PRN. RASS goal: -2. Daily WUA. Dilaudid PRN. Hold outpatient oxycodone.   Family updated: None.  Interdisciplinary Family Meeting v Palliative Care Meeting:  Due by: 2/10.   Rutherford Guys, PA  - C  Pulmonary & Critical Care Medicine Pgr: [phone]  or [phone] 01/16/2015, 5:04 PM    Attending Note:  I have examined patient, reviewed labs, studies and notes. I have discussed the case with Ihor Dow, and I agree with the data and plans as amended above. Complicated medical history of Crohn's, small bowel inflammatory injury awaiting SB transplant, cirrhosis. Admitted from [facility] with B infiltrates consistent with HCAP +/- ARDS. He was urgently intubated, has evolved shock presumed from sepsis. On eval he is sedated, intubated, diaphoretic on norepi gtt. He has maroon secretions suctioning from GT. We will stabilize on MV, ARDS protocol, continue norepi and volume support, treat HCAP. If he decompensates or does not respond to therapy will consider other sources, especially intra-abd source.  Independent critical care time is 60 minutes.   Levy Pupa, MD, PhD 01/16/2015, 5:55 PM [place] [facility] [phone] or if no answer [phone]

## 2015-01-16 NOTE — Consult Note (Signed)
Reason for Consult:Metabolic acidosis, Hx Crohn's disease, and sepsis Referring Physician: Eustace Pen, Critical Care PA  David [name] is an 39 y.o. male.  HPI: Recently admitted from [facility] with sepsis thought to be due to recurrent aspiration with pneumonia, abdomen was thought to be potential source of ongoing sepsis with worsening acidosi, lactic acidosis, leukocytosis.  He is also in renal failure and hyperkalemic.  Just started on CVVHD this evening.  Past Medical History  Diagnosis Date  . 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 Hepatomegaly 05/04/2011  . Fatty liver 05/04/2011    "stage III fatty liver fibrosis" (11/15/2012)  . GERD (gastroesophageal reflux disease)   . Chronic liver disease     /notes 11/15/2012  . Pericarditis     Archie Endo 11/15/2012  . Hypertension   . Pneumonia 1977  . Shortness of breath     "all the time right now" (11/15/2012)  . History of blood transfusion 2004  . History of stomach ulcers   . Duodenal ulcer   . Depression   . Kidney stones     bilaterally/notes 11/15/2012  . Hepatic fibrosis     Ar[facility] Endo 11/15/2012  . Anemia   . Pericardial effusion 10/29/2012    moderate to large/notes 11/15/2012  . Anxiety   . Hepatitis   . Crohn's disease   . ED (erectile dysfunction)   . 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 Anal examination under anesthesia  02/11/2011    [name]  . Treatment fistula anal  07/03/11    This was a second surgery to repair Anal fistula  . Gastrojejunostomy  2004    "had hole cut in small intestines during endoscopy; had to have OR & leave me open 3 months" (11/15/2012)  . Cholecystectomy  ~ 2003  . Appendectomy  ~ 2003  . Video assisted thoracoscopy  11/17/2012    Procedure: VIDEO ASSISTED THORACOSCOPY;  Surgeon: Melrose Nakayama, MD;  Location: [facility];  Service: Thoracic;   Laterality: Left;  drainage of left pleural effusion  . Pericardial window  11/17/2012    Procedure: PERICARDIAL WINDOW;  Surgeon: Melrose Nakayama, MD;  Location: [facility];  Service: Thoracic;  Laterality: N/A;  . Esophagogastroduodenoscopy  01/06/2013    Procedure: ESOPHAGOGASTRODUODENOSCOPY (EGD);  Surgeon: Rogene Houston, MD;  Location: [facility];  Service: Endoscopy;  Laterality: N/A;  11:15  . Esophagogastroduodenoscopy N/A 03/09/2014    Dr. Laural Golden: erosive/ulcerative reflux esophagiits, portal gastropathy, moderate amount of bile and food debris in stomach precluding visualization of GJ anastomosis  . Gastrostomy tube placement  11/19/14    [facility]: 52 F APDL catheter. Checked again 12/23 via fluoroscopy and in suitable position  . Colonoscopy  Aug 2015    Dr. Lysle Rubens at [facility]: congested mucosa in entire colon, solitary ulcer distal ileum, stricture in ileum 5 cm from IC valve s/p dilation. Surveillance in 1 year  . Exploratory laparotomy  Nov 05, 2014    [facility], Dr. Morton Stall. Several hour operation with unsuccessful lysis of adhesions due to frozen abdomen  . Picc line      Family History  Problem Relation Age of Onset  . Diabetes Mother   . Diabetes Brother   . Healthy Daughter   . Healthy Son   . Colon cancer Neg Hx     Social  History:  reports that he quit smoking about 2 years ago. His smoking use included Cigarettes. He started smoking about 2 years ago. He has a 12 pack-year smoking history. His smokeless tobacco use includes Snuff. He reports that he does not drink alcohol or use illicit drugs.  Allergies:  Allergies  Allergen Reactions  . Remicade [Infliximab]     Dr Laural Golden states previous respiratory arrest not related to remicade. Dr Laural Golden states remicade not a drug related allergy. 07-07-2013 at 1025 rapid response called to PACU- Patient difficulty breathing after infusion of Remicade infusing. Do NOT Give Remicade!  . Fentanyl And Related Itching     Swelling, Redness  . Alfentanil Itching    Swelling, Redness  . Wellbutrin [Bupropion] Nausea And Vomiting  . Morphine And Related Hives    Medications: I have reviewed the patient's current medications.  Results for orders placed or performed during the hospital encounter of 01/15/15 (from the past 48 hour(s))  CBC with Differential     Status: Abnormal   Collection Time: 01/15/15  2:40 PM  Result Value Ref Range   WBC 10.4 4.0 - 10.5 K/uL   RBC 3.54 (L) 4.22 - 5.81 MIL/uL   Hemoglobin 9.0 (L) 13.0 - 17.0 g/dL   HCT 29.2 (L) 39.0 - 52.0 %   MCV 82.5 78.0 - 100.0 fL   MCH 25.4 (L) 26.0 - 34.0 pg   MCHC 30.8 30.0 - 36.0 g/dL   RDW 19.4 (H) 11.5 - 15.5 %   Platelets 76 (L) 150 - 400 K/uL    Comment: SPECIMEN CHECKED FOR CLOTS PLATELET COUNT CONFIRMED BY SMEAR PLATELETS APPEAR DECREASED    Neutrophils Relative % 97 (H) 43 - 77 %   Neutro Abs 10.0 (H) 1.7 - 7.7 K/uL   Lymphocytes Relative 1 (L) 12 - 46 %   Lymphs Abs 0.1 (L) 0.7 - 4.0 K/uL   Monocytes Relative 2 (L) 3 - 12 %   Monocytes Absolute 0.2 0.1 - 1.0 K/uL   Eosinophils Relative 0 0 - 5 %   Eosinophils Absolute 0.0 0.0 - 0.7 K/uL   Basophils Relative 0 0 - 1 %   Basophils Absolute 0.0 0.0 - 0.1 K/uL   WBC Morphology MILD LEFT SHIFT (1-5% METAS, OCC MYELO, OCC BANDS)    RBC Morphology SCHISTOCYTES NOTED ON SMEAR   Comprehensive metabolic panel     Status: Abnormal   Collection Time: 01/15/15  2:40 PM  Result Value Ref Range   Sodium 132 (L) 135 - 145 mmol/L   Potassium 4.5 3.5 - 5.1 mmol/L   Chloride 104 96 - 112 mmol/L   CO2 20 19 - 32 mmol/L   Glucose, Bld 176 (H) 70 - 99 mg/dL   BUN 62 (H) 6 - 23 mg/dL   Creatinine, Ser 1.38 (H) 0.50 - 1.35 mg/dL   Calcium 7.3 (L) 8.4 - 10.5 mg/dL   Total Protein 5.6 (L) 6.0 - 8.3 g/dL   Albumin 2.7 (L) 3.5 - 5.2 g/dL   AST 164 (H) 0 - 37 U/L   ALT 94 (H) 0 - 53 U/L   Alkaline Phosphatase 125 (H) 39 - 117 U/L   Total Bilirubin 1.9 (H) 0.3 - 1.2 mg/dL   GFR calc non Af Amer  64 (L) >90 mL/min   GFR calc Af Amer 74 (L) >90 mL/min    Comment: (NOTE) The eGFR has been calculated using the CKD EPI equation. This calculation has not been validated in all clinical situations.  eGFR's persistently <90 mL/min signify possible Chronic Kidney Disease.    Anion gap 8 5 - 15  Blood culture (routine x 2)     Status: None (Preliminary result)   Collection Time: 01/15/15  3:07 PM  Result Value Ref Range   Specimen Description BLOOD LEFT ANTECUBITAL    Special Requests BOTTLES DRAWN AEROBIC AND ANAEROBIC 6CC    Culture      YEAST Gram Stain Report Called to,Read Back By and Verified With: Vickii Penna [facility]) AT 1803 ON 01/16/2015 BY BAUGHAM,M. Performed at [facility]    Report Status PENDING   Blood culture (routine x 2)     Status: None (Preliminary result)   Collection Time: 01/15/15  3:12 PM  Result Value Ref Range   Specimen Description BLOOD LEFT ARM    Special Requests BOTTLES DRAWN AEROBIC AND ANAEROBIC 8CC    Culture      YEAST Gram Stain Report Called to,Read Back By and Verified With: Vickii Penna ([facility] [place]) AT 1803 ON 01/16/2015 BY BAUGHAM,M. Performed at [facility]    Report Status PENDING   I-Stat CG4 Lactic Acid, ED     Status: Abnormal   Collection Time: 01/15/15  3:21 PM  Result Value Ref Range   Lactic Acid, Venous 2.72 (HH) 0.5 - 2.0 mmol/L  Blood gas, arterial     Status: Abnormal   Collection Time: 01/15/15  6:32 PM  Result Value Ref Range   FIO2 1.00 %   Delivery systems NON-REBREATHER OXYGEN MASK    pH, Arterial 7.304 (L) 7.350 - 7.450   pCO2 arterial 40.0 35.0 - 45.0 mmHg   pO2, Arterial 148.0 (H) 80.0 - 100.0 mmHg   Bicarbonate 19.2 (L) 20.0 - 24.0 mEq/L   TCO2 18.4 0 - 100 mmol/L   Acid-base deficit 6.0 (H) 0.0 - 2.0 mmol/L   O2 Saturation 98.7 %   Patient temperature 37.0    Collection site LEFT RADIAL    Drawn by [id number]    Sample type ARTERIAL DRAW    Allens test (pass/fail) PASS PASS  MRSA PCR  Screening     Status: None   Collection Time: 01/15/15  6:33 PM  Result Value Ref Range   MRSA by PCR NEGATIVE NEGATIVE    Comment:        The GeneXpert MRSA Assay (FDA approved for NASAL specimens only), is one component of a comprehensive MRSA colonization surveillance program. It is not intended to diagnose MRSA infection nor to guide or monitor treatment for MRSA infections.   Strep pneumoniae urinary antigen     Status: None   Collection Time: 01/15/15  7:36 PM  Result Value Ref Range   Strep Pneumo Urinary Antigen NEGATIVE NEGATIVE    Comment: PERFORMED AT [facility]        Infection due to S. pneumoniae cannot be absolutely ruled out since the antigen present may be below the detection limit of the test. Performed at [facility]   Comprehensive metabolic panel     Status: Abnormal   Collection Time: 01/16/15  4:42 AM  Result Value Ref Range   Sodium 135 135 - 145 mmol/L   Potassium 4.7 3.5 - 5.1 mmol/L   Chloride 104 96 - 112 mmol/L   CO2 22 19 - 32 mmol/L   Glucose, Bld 85 70 - 99 mg/dL   BUN 62 (H) 6 - 23 mg/dL   Creatinine, Ser 1.24 0.50 - 1.35 mg/dL   Calcium  7.2 (L) 8.4 - 10.5 mg/dL   Total Protein 5.4 (L) 6.0 - 8.3 g/dL   Albumin 2.6 (L) 3.5 - 5.2 g/dL   AST 162 (H) 0 - 37 U/L   ALT 97 (H) 0 - 53 U/L   Alkaline Phosphatase 139 (H) 39 - 117 U/L   Total Bilirubin 3.3 (H) 0.3 - 1.2 mg/dL   GFR calc non Af Amer 72 (L) >90 mL/min   GFR calc Af Amer 84 (L) >90 mL/min    Comment: (NOTE) The eGFR has been calculated using the CKD EPI equation. This calculation has not been validated in all clinical situations. eGFR's persistently <90 mL/min signify possible Chronic Kidney Disease.    Anion gap 9 5 - 15  CBC     Status: Abnormal   Collection Time: 01/16/15  4:42 AM  Result Value Ref Range   WBC 9.1 4.0 - 10.5 K/uL   RBC 3.73 (L) 4.22 - 5.81 MIL/uL   Hemoglobin 9.5 (L) 13.0 - 17.0 g/dL   HCT 30.9 (L) 39.0 - 52.0 %   MCV 82.8 78.0 -  100.0 fL   MCH 25.5 (L) 26.0 - 34.0 pg   MCHC 30.7 30.0 - 36.0 g/dL   RDW 19.5 (H) 11.5 - 15.5 %   Platelets 57 (L) 150 - 400 K/uL    Comment: SPECIMEN CHECKED FOR CLOTS PLATELET COUNT CONFIRMED BY SMEAR   Blood gas, arterial     Status: Abnormal   Collection Time: 01/16/15 10:55 AM  Result Value Ref Range   FIO2 0.85 %   O2 Content 85.0 L/min   Delivery systems NASAL CANNULA    pH, Arterial 7.237 (L) 7.350 - 7.450   pCO2 arterial 38.5 35.0 - 45.0 mmHg   pO2, Arterial 68.2 (L) 80.0 - 100.0 mmHg   Bicarbonate 15.8 (L) 20.0 - 24.0 mEq/L   TCO2 15.2 0 - 100 mmol/L   Acid-base deficit 10.2 (H) 0.0 - 2.0 mmol/L   O2 Saturation 89.6 %   Patient temperature 37.0    Collection site LEFT RADIAL    Drawn by [id number]    Sample type ARTERIAL    Allens test (pass/fail) PASS PASS  Draw ABG 1 hour after initiation of ventilator     Status: Abnormal   Collection Time: 01/16/15 12:39 PM  Result Value Ref Range   FIO2 1.00 %   O2 Content 100.0 L/min   Delivery systems VENTILATOR    Mode PRESSURE REGULATED VOLUME CONTROL    VT 600 mL   Rate 16 resp/min   Peep/cpap 5.0 cm H20   pH, Arterial 7.040 (LL) 7.350 - 7.450    Comment: CRITICAL RESULT CALLED TO, READ BACK BY AND VERIFIED WITH: CYNTHIA PHILLIPS,RN AT 1448 BY PEVIANY LAWSON,RRT ON 01/16/2015.    pCO2 arterial 62.6 (HH) 35.0 - 45.0 mmHg    Comment: CRITICAL RESULT CALLED TO, READ BACK BY AND VERIFIED WITH: CYNTHIA PHILLIPS,RN ON 01/16/2015 BY PEVIANY LAWSON,RRT AT 0998.    pO2, Arterial 217.0 (H) 80.0 - 100.0 mmHg   Bicarbonate 16.1 (L) 20.0 - 24.0 mEq/L   TCO2 16.5 0 - 100 mmol/L   Acid-base deficit 12.8 (H) 0.0 - 2.0 mmol/L   O2 Saturation 98.3 %   Patient temperature 37.0    Collection site LEFT RADIAL    Drawn by [id number]    Sample type ARTERIAL    Allens test (pass/fail) PASS PASS  I-STAT 3, arterial blood gas (G3+)     Status: Abnormal   Collection  Time: 01/16/15  6:11 PM  Result Value Ref Range   pH, Arterial 7.144 (LL)  7.350 - 7.450   pCO2 arterial 48.1 (H) 35.0 - 45.0 mmHg   pO2, Arterial 109.0 (H) 80.0 - 100.0 mmHg   Bicarbonate 16.6 (L) 20.0 - 24.0 mEq/L   TCO2 18 0 - 100 mmol/L   O2 Saturation 96.0 %   Acid-base deficit 12.0 (H) 0.0 - 2.0 mmol/L   Patient temperature 98.1 F    Collection site RADIAL, ALLEN'S TEST ACCEPTABLE    Drawn by Operator    Sample type ARTERIAL    Comment NOTIFIED PHYSICIAN   Comprehensive metabolic panel     Status: Abnormal   Collection Time: 01/16/15  7:04 PM  Result Value Ref Range   Sodium 133 (L) 135 - 145 mmol/L   Potassium 7.0 (HH) 3.5 - 5.1 mmol/L    Comment: NO VISIBLE HEMOLYSIS REPEATED TO VERIFY CRITICAL RESULT CALLED TO, READ BACK BY AND VERIFIED WITH: J SUTTER,RN 2004 01/16/15 WBOND    Chloride 105 96 - 112 mmol/L   CO2 16 (L) 19 - 32 mmol/L   Glucose, Bld 79 70 - 99 mg/dL   BUN 68 (H) 6 - 23 mg/dL   Creatinine, Ser 2.10 (H) 0.50 - 1.35 mg/dL   Calcium 7.0 (L) 8.4 - 10.5 mg/dL   Total Protein 5.6 (L) 6.0 - 8.3 g/dL   Albumin 2.5 (L) 3.5 - 5.2 g/dL   AST 257 (H) 0 - 37 U/L   ALT 108 (H) 0 - 53 U/L   Alkaline Phosphatase 198 (H) 39 - 117 U/L   Total Bilirubin 4.9 (H) 0.3 - 1.2 mg/dL   GFR calc non Af Amer 38 (L) >90 mL/min   GFR calc Af Amer 44 (L) >90 mL/min    Comment: (NOTE) The eGFR has been calculated using the CKD EPI equation. This calculation has not been validated in all clinical situations. eGFR's persistently <90 mL/min signify possible Chronic Kidney Disease.    Anion gap 12 5 - 15  Procalcitonin     Status: None   Collection Time: 01/16/15  7:04 PM  Result Value Ref Range   Procalcitonin 30.89 ng/mL    Comment:        Interpretation: PCT >= 10 ng/mL: Important systemic inflammatory response, almost exclusively due to severe bacterial sepsis or septic shock. (NOTE)         ICU PCT Algorithm               Non ICU PCT Algorithm    ----------------------------     ------------------------------         PCT < 0.25 ng/mL                  PCT < 0.1 ng/mL     Stopping of antibiotics            Stopping of antibiotics       strongly encouraged.               strongly encouraged.    ----------------------------     ------------------------------       PCT level decrease by               PCT < 0.25 ng/mL       >= 80% from peak PCT       OR PCT 0.25 - 0.5 ng/mL          Stopping of antibiotics  encouraged.     Stopping of antibiotics           encouraged.    ----------------------------     ------------------------------       PCT level decrease by              PCT >= 0.25 ng/mL       < 80% from peak PCT        AND PCT >= 0.5 ng/mL             Continuing antibiotics                                              encouraged.       Continuing antibiotics            encouraged.    ----------------------------     ------------------------------     PCT level increase compared          PCT > 0.5 ng/mL         with peak PCT AND          PCT >= 0.5 ng/mL             Escalation of antibiotics                                          strongly encouraged.      Escalation of antibiotics        strongly encouraged.   CBC     Status: Abnormal   Collection Time: 01/16/15  7:04 PM  Result Value Ref Range   WBC 31.1 (H) 4.0 - 10.5 K/uL   RBC 3.97 (L) 4.22 - 5.81 MIL/uL   Hemoglobin 10.3 (L) 13.0 - 17.0 g/dL   HCT 33.2 (L) 39.0 - 52.0 %   MCV 83.6 78.0 - 100.0 fL   MCH 25.9 (L) 26.0 - 34.0 pg   MCHC 31.0 30.0 - 36.0 g/dL   RDW 20.2 (H) 11.5 - 15.5 %   Platelets 120 (L) 150 - 400 K/uL  Triglycerides     Status: Abnormal   Collection Time: 01/16/15  7:04 PM  Result Value Ref Range   Triglycerides 301 (H) <150 mg/dL  Lactic acid, plasma     Status: Abnormal   Collection Time: 01/16/15  7:04 PM  Result Value Ref Range   Lactic Acid, Venous 4.7 (HH) 0.5 - 2.0 mmol/L    Comment: CRITICAL RESULT CALLED TO, READ BACK BY AND VERIFIED WITH: J SUTTER,RN 2005 01/16/15 WBOND REPEATED TO  VERIFY   Troponin I     Status: Abnormal   Collection Time: 01/16/15  7:04 PM  Result Value Ref Range   Troponin I 0.07 (H) <0.031 ng/mL    Comment:        PERSISTENTLY INCREASED TROPONIN VALUES IN THE RANGE OF 0.04-0.49 ng/mL CAN BE SEEN IN:       -UNSTABLE ANGINA       -CONGESTIVE HEART FAILURE       -MYOCARDITIS       -CHEST TRAUMA       -ARRYHTHMIAS       -LATE PRESENTING MYOCARDIAL INFARCTION       -COPD   CLINICAL FOLLOW-UP RECOMMENDED.   Glucose, capillary     Status:  None   Collection Time: 01/16/15  7:56 PM  Result Value Ref Range   Glucose-Capillary 92 70 - 99 mg/dL   Comment 1 Capillary Sample    Comment 2 Documented in Chart    Comment 3 Notify RN   Lipase, blood     Status: None   Collection Time: 01/16/15  8:19 PM  Result Value Ref Range   Lipase 24 11 - 59 U/L  Amylase     Status: None   Collection Time: 01/16/15  8:19 PM  Result Value Ref Range   Amylase 52 0 - 105 U/L  Potassium     Status: Abnormal   Collection Time: 01/16/15  8:19 PM  Result Value Ref Range   Potassium 7.1 (HH) 3.5 - 5.1 mmol/L    Comment: REPEATED TO VERIFY NO VISIBLE HEMOLYSIS CRITICAL RESULT CALLED TO, READ BACK BY AND VERIFIED WITH: J SUTTER,RN 2110 01/16/15 WBOND   I-STAT 3, arterial blood gas (G3+)     Status: Abnormal   Collection Time: 01/16/15  8:47 PM  Result Value Ref Range   pH, Arterial 7.174 (LL) 7.350 - 7.450   pCO2 arterial 41.9 35.0 - 45.0 mmHg   pO2, Arterial 100.0 80.0 - 100.0 mmHg   Bicarbonate 15.5 (L) 20.0 - 24.0 mEq/L   TCO2 17 0 - 100 mmol/L   O2 Saturation 96.0 %   Acid-base deficit 12.0 (H) 0.0 - 2.0 mmol/L   Patient temperature 98.5 F    Collection site RADIAL, ALLEN'S TEST ACCEPTABLE    Drawn by VENIPUNCTURE    Sample type ARTERIAL    Comment NOTIFIED PHYSICIAN   Basic metabolic panel     Status: Abnormal   Collection Time: 01/16/15  9:25 PM  Result Value Ref Range   Sodium 131 (L) 135 - 145 mmol/L   Potassium 7.2 (HH) 3.5 - 5.1 mmol/L     Comment: NO VISIBLE HEMOLYSIS REPEATED TO VERIFY CRITICAL RESULT CALLED TO, READ BACK BY AND VERIFIED WITH: SUTTER [facility] 01/16/15 2215 WAYK    Chloride 103 96 - 112 mmol/L   CO2 14 (L) 19 - 32 mmol/L   Glucose, Bld 93 70 - 99 mg/dL   BUN 72 (H) 6 - 23 mg/dL   Creatinine, Ser 2.29 (H) 0.50 - 1.35 mg/dL   Calcium 6.8 (L) 8.4 - 10.5 mg/dL   GFR calc non Af Amer 34 (L) >90 mL/min   GFR calc Af Amer 40 (L) >90 mL/min    Comment: (NOTE) The eGFR has been calculated using the CKD EPI equation. This calculation has not been validated in all clinical situations. eGFR's persistently <90 mL/min signify possible Chronic Kidney Disease.    Anion gap 14 5 - 15    Dg Chest 2 View  01/15/2015   CLINICAL DATA:  Pneumonia  EXAM: CHEST  2 VIEW  COMPARISON:  12/26/2014  FINDINGS: Right upper extremity PICC, tip at the distal SVC.  Chronically low lung volumes with reticular nodular densities at the right base. There is new patchy airspace opacity in the bilateral anterior lungs. No edema, effusion, or pneumothorax. Normal heart size and mediastinal contours.  IMPRESSION: Bilateral pneumonia.   Electronically Signed   By: Jorje Guild M.D.   On: 01/15/2015 14:39   Dg Chest Port 1 View  01/16/2015   CLINICAL DATA:  Encounter for intubation  EXAM: PORTABLE CHEST - 1 VIEW  COMPARISON:  01/16/2015  FINDINGS: Endotracheal tube tip is at the clavicular heads. A gastric suction tube enters the  stomach. Stable right upper extremity PICC, tip at the upper cavoatrial junction.  Heart size and aortic contours remain normal. Bilateral airspace disease, asymmetric to the left, is unchanged. No increasing pleural fluid or air leak.  IMPRESSION: 1. Stable positioning of tubes and central line. 2. Unchanged bilateral pneumonia and/or noncardiogenic edema.   Electronically Signed   By: Jorje Guild M.D.   On: 01/16/2015 19:14   Dg Chest Port 1 View  01/16/2015   CLINICAL DATA:  Respiratory failure  EXAM: PORTABLE CHEST - 1  VIEW  COMPARISON:  January 16, 2015 study obtained earlier in the day  FINDINGS: Endotracheal tube tip is 12.3 cm above the carina. Nasogastric tube tip and side port are in the stomach. Central catheter tip is in the superior vena cava just superior to the cavoatrial junction. No pneumothorax. There is widespread interstitial and patchy alveolar edema bilaterally, essentially stable. Heart is mildly enlarged with pulmonary venous hypertension. No adenopathy.  IMPRESSION: Tube and catheter positions as described without pneumothorax. Note that the endotracheal tube tip is 12.3 cm above the carina. The appearance is consistent with congestive heart failure, likely with superimposed ARDS. The appearance of the lungs and cardiac silhouette is essentially stable compared to earlier in the day.  Critical Value/emergent results were called by telephone at the time of interpretation on 01/16/2015 at 5:30 pm to Vickii Penna, RN, who verbally acknowledged these results.   Electronically Signed   By: Lowella Grip M.D.   On: 01/16/2015 17:30   Portable Chest Xray  01/16/2015   CLINICAL DATA:  Endotracheal tube placement.  EXAM: PORTABLE CHEST - 1 VIEW  COMPARISON:  Earlier the same date and 01/15/2015.  FINDINGS: 1222 hr. Endotracheal tube tip is just below the thoracic inlet, approximately 8 cm above the carina. Nasogastric tube projects below the diaphragm, tip not visualized. Right arm PICC appears unchanged near the SVC right atrial junction. There has been further worsening of the left greater than right bilateral airspace opacities with associated air bronchograms. There is no significant pleural effusion. The heart size and mediastinal contours are stable.  IMPRESSION: Endotracheal tube tip is just below the thoracic inlet. This could be advanced approximately 4 cm for more optimal positioning. Progression of bilateral airspace opacities worrisome for ARDS.   Electronically Signed   By: Camie Patience M.D.   On:  01/16/2015 12:35   Dg Chest Port 1 View  01/16/2015   CLINICAL DATA:  Pneumonia.  EXAM: PORTABLE CHEST - 1 VIEW  COMPARISON:  01/15/2015 and 12/26/2014  FINDINGS: There has been marked progression of bilateral pulmonary infiltrates particularly in the left upper lobe. PICC appears in good position. Heart size and vascularity are normal. I suspect there are small bilateral pleural effusions.  IMPRESSION: Marked progression of extensive bilateral pulmonary infiltrates. Probable new effusions.   Electronically Signed   By: Rozetta Nunnery M.D.   On: 01/16/2015 11:16   Dg Chest Port 1v Same Day  01/16/2015   CLINICAL DATA:  Endotracheal tube repositioning.  Initial encounter.  EXAM: PORTABLE CHEST - 1 VIEW SAME DAY  COMPARISON:  Earlier the same day.  FINDINGS: 1258 hr. The endotracheal tube partially overlaps the nasogastric tube and is not optimally visualized, although appears to extend to approximately 3.1 cm above the carina. The right arm PICC and nasogastric tube are unchanged. Left greater than right airspace opacities have not significantly progressed. There is no evidence of pneumothorax.  IMPRESSION: Apparent repositioning of the endotracheal tube in the mid  trachea (not well seen). No significant change in bilateral airspace opacities.   Electronically Signed   By: Camie Patience M.D.   On: 01/16/2015 13:38   Dg Abd Portable 1v  01/16/2015   CLINICAL DATA:  39 year old male with 1 day history of abdominal pain and distension  EXAM: PORTABLE ABDOMEN - 1 VIEW  COMPARISON:  Prior CT abdomen/pelvis 12/30/2014 ; prior acute abdominal series 113 2016  FINDINGS: Percutaneous gastrostomy tube projects over the right upper quadrant in similar position compared to prior imaging. Additionally, a nasogastric tube appears present in the more proximal aspect of the stomach. The bowel gas pattern is not obstructed on this single view. No large free air although this is a supine study. A femoral vascular catheter projects  over the right hip.  IMPRESSION: 1. Nonobstructed bowel gas pattern. 2. Nasogastric tube and percutaneous gastrostomy tubes project over the stomach.   Electronically Signed   By: Jacqulynn Cadet M.D.   On: 01/16/2015 20:52    ROS Blood pressure 90/36, pulse 113, temperature 98.5 F (36.9 C), temperature source Oral, resp. rate 35, height 5' 11" (1.803 m), weight 91.2 kg (201 lb 1 oz), SpO2 96 %. Physical Exam  GI: Soft. He exhibits distension. Bowel sounds are absent. There is no tenderness (patient intubated and sedated--difficult to determine if there is indeed tenderness.). There is no rigidity, no rebound and no guarding.  Neurological: He is unresponsive. GCS eye subscore is 1. GCS verbal subscore is 1. GCS motor subscore is 3.    Assessment/Plan: Patient with worsening sepsis, acidosis, hyperkalemia, multiple system failure with a rising WBC.  It is difficult to rule out the abdomen as a source of sepsis with this obtunded patient who has had multiple operations in the past for Crohn's disease.   Apparently the last time that they tried to operate on the patient at [facility] (where he has had most of his care for his Crohn's disease) they could not get into his abdomen physically because of scar tissue.  I  Have requested that a CT scan of the abdomen be performed although the last KUB did not demonstrate anything that would be surgical.  EVen if the CT shows a problem, based on recent information from [place], the patient may not be a surgical candidate.  Will follow up after CT has been done.  Tequan Redmon, JAY 01/16/2015, 11:52 PM

## 2015-01-16 NOTE — Telephone Encounter (Signed)
Per Dr.Rehman , they will need to make the Physician that is handling case aware. Dr.Rehman is now aware that he is in the hospital/unit.

## 2015-01-16 NOTE — Progress Notes (Signed)
PARENTERAL NUTRITION CONSULT NOTE - INITIAL  Pharmacy Consult for TPN Indication: end stage Crohn's disease, on chronic TPN from home  Allergies  Allergen Reactions  . Remicade [Infliximab]     Dr Karilyn Cota states previous respiratory arrest not related to remicade. Dr Karilyn Cota states remicade not a drug related allergy. 07-07-2013 at 1025 rapid response called to PACU- Patient difficulty breathing after infusion of Remicade infusing. Do NOT Give Remicade!  . Fentanyl And Related Itching    Swelling, Redness  . Alfentanil Itching    Swelling, Redness  . Wellbutrin [Bupropion] Nausea And Vomiting  . Morphine And Related Hives   Patient Measurements: Height: 5\' 11"  (180.3 cm) Weight: 195 lb 8.8 oz (88.7 kg) IBW/kg (Calculated) : 75.3  Vital Signs: Temp: 98.2 F (36.8 C) (02/03 0730) Temp Source: Oral (02/03 0730) BP: 101/59 mmHg (02/03 1300) Pulse Rate: 125 (02/03 1315) Intake/Output from previous day: 02/02 0701 - 02/03 0700 In: 540 [P.O.:240; IV Piggyback:300] Out: 1100 [Urine:1100] Intake/Output from this shift: Total I/O In: -  Out: 175 [Urine:175]  Labs:  Recent Labs  01/15/15 1440 01/16/15 0442  WBC 10.4 9.1  HGB 9.0* 9.5*  HCT 29.2* 30.9*  PLT 76* 57*    Recent Labs  01/15/15 1440 01/16/15 0442  NA 132* 135  K 4.5 4.7  CL 104 104  CO2 20 22  GLUCOSE 176* 85  BUN 62* 62*  CREATININE 1.38* 1.24  CALCIUM 7.3* 7.2*  PROT 5.6* 5.4*  ALBUMIN 2.7* 2.6*  AST 164* 162*  ALT 94* 97*  ALKPHOS 125* 139*  BILITOT 1.9* 3.3*   Estimated Creatinine Clearance: 86 mL/min (by C-G formula based on Cr of 1.24).   No results for input(s): GLUCAP in the last 72 hours.  Medical History: Past Medical History  Diagnosis Date  . 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 Hepatomegaly 05/04/2011  . Fatty liver 05/04/2011    "stage III fatty liver fibrosis" (11/15/2012)  . GERD  (gastroesophageal reflux disease)   . Chronic liver disease     /notes 11/15/2012  . Pericarditis     [name] 11/15/2012  . Hypertension   . Pneumonia 1977  . Shortness of breath     "all the time right now" (11/15/2012)  . History of blood transfusion 2004  . History of stomach ulcers   . Duodenal ulcer   . Depression   . Kidney stones     bilaterally/notes 11/15/2012  . Hepatic fibrosis     [name] 11/15/2012  . Anemia   . Pericardial effusion 10/29/2012    moderate to large/notes 11/15/2012  . Anxiety   . Hepatitis   . Crohn's disease   . ED (erectile dysfunction)   . Cirrhosis     secondary to drug effect, +/- fatty liver   Medications:  Prescriptions prior to admission  Medication Sig Dispense Refill Last Dose  . calcium-vitamin D (OSCAL 500/200 D-3) 500-200 MG-UNIT per tablet Take 1 tablet by mouth 2 (two) times daily.   01/15/2015 at Unknown time  . dicyclomine (BENTYL) 10 MG capsule Take 1 capsule (10 mg total) by mouth 3 (three) times daily as needed (stomach cramps). (Patient taking differently: Take 10 mg by mouth daily as needed for spasms (stomach cramps). ) 90 capsule 5 OVER 30 DAYS at Unknown time  . ferrous gluconate (FERGON) 324 MG tablet Take 324 mg by mouth 3 (three) times daily.   01/15/2015 at  Unknown time  . fish oil-omega-3 fatty acids 1000 MG capsule Take 1 g by mouth 3 (three) times daily.   01/15/2015 at Unknown time  . metolazone (ZAROXOLYN) 5 MG tablet Take 1 tablet (5 mg total) by mouth 2 (two) times daily. 30 tablet 0 01/15/2015 at Unknown time  . oxyCODONE (ROXICODONE INTENSOL) 20 MG/ML concentrated solution Take 2.5 mLs (50 mg total) by mouth 4 (four) times daily. 300 mL 0 01/15/2015 at 800A  . pantoprazole (PROTONIX) 40 MG tablet Take 1 tablet (40 mg total) by mouth 2 (two) times daily. 60 tablet 5 01/15/2015 at Unknown time  . potassium chloride 20 MEQ/15ML (10%) SOLN Take 30 mLs (40 mEq total) by mouth 3 (three) times daily. 900 mL 0 01/15/2015 at Unknown time  .  predniSONE (DELTASONE) 20 MG tablet Take 1 tablet (20 mg total) by mouth daily with breakfast. 30 tablet 0 01/15/2015 at Unknown time  . Probiotic Product (ALIGN) 4 MG CAPS Take 4 mg by mouth daily.    01/15/2015 at Unknown time  . promethazine (PHENERGAN) 25 MG tablet Take 1 tablet (25 mg total) by mouth 2 (two) times daily as needed. nausea 30 tablet 1 Past Week at Unknown time  . spironolactone (ALDACTONE) 25 MG tablet Take 1 tablet (25 mg total) by mouth 2 (two) times daily. 60 tablet 1 01/15/2015 at Unknown time  . sucralfate (CARAFATE) 1 G tablet Take 1 g by mouth 3 (three) times daily before meals.   01/15/2015 at Unknown time  . polyethylene glycol (MIRALAX / GLYCOLAX) packet Take 17 g by mouth daily as needed for mild constipation or moderate constipation.    OVE R30 DAYS   Insulin Requirements in the past 24 hours:  none  Current Nutrition:  TPN and soft diet (has not been on TPN since readmitted 2/2)  Assessment: HPI Comments: David [name] is a 39 y.o. 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 He reports he has had a productive cough. Does not wear oxygen at baseline. He is hypoxic in the ED. Denies chest pain. Has chronic abdominal pain that he reports is unchanged as well as unchanged abdominal distention.  PT was d/c from hospital 12/31/14 on home TPN d/t chronic chron's disease and has been battling pneumonia x1 year from aspiration. PT on liquid diet and very soft foods. PT sent from Dr. Gerda Diss office d/t thought of pneumonia. PT has picc line to right upper arm in place.  Pt has not been on TPN since readmitted 2/2.  During last admission pt had an issue with edema and fluid retention.  Will advance TPN rate  cautiously.    Nutritional Goals:  Estimated Nutritional Needs: Kcal: 2100-2300 (pt is currently on Diprivan which provides ~ 1 cal/ml from fats, currently infusing at 21ml/hr)  Protein: 96-106 grams Fluid: 2.1-2.3 L  Plan:   Begin Clinimix E 5/15 today at 68ml/hr  Advance TPN rate cautiously to discourage edema / fluid retention  Monitor Diprivan rate, triglycerides, and adjust Lipids as needed  No lipids today due to Diprivan infusion  Provide MVI and Trace Elements with TPN daily  Monitor lytes, renal fxn, fluid status and glucose tolerance  Margo Aye, Salihah Peckham A 01/16/2015,1:32 PM

## 2015-01-16 NOTE — Consult Note (Signed)
Consult requested by: [facility] Consult requested for pneumonia:  HPI: This is a 39 year old with a very complicated past medical history including previous episodes of aspiration, Crohn's disease, cirrhosis of the liver from fatty infiltration of the liver, peptic ulcer disease and who came to the hospital with shortness of breath. He went to see his primary care physician and was advised to come to the emergency department. He was found to have bilateral pneumonia in the ER. He has been having trouble with his breathing and his tachypnea. He tried BiPAP but could not tolerate it. He says he is coughing a little bit. This pneumonia is in an unusual location for aspiration so may not be related to aspiration this time. He is known to have very poor nutritional status because of his Crohn's disease and complications of that and has been receiving TPN  Past Medical History  Diagnosis Date  . 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 Hepatomegaly 05/04/2011  . Fatty liver 05/04/2011    "stage III fatty liver fibrosis" (11/15/2012)  . GERD (gastroesophageal reflux disease)   . Chronic liver disease     /notes 11/15/2012  . Pericarditis     [name] 11/15/2012  . Hypertension   . Pneumonia 1977  . Shortness of breath     "all the time right now" (11/15/2012)  . History of blood transfusion 2004  . History of stomach ulcers   . Duodenal ulcer   . Depression   . Kidney stones     bilaterally/notes 11/15/2012  . Hepatic fibrosis     [name] 11/15/2012  . Anemia   . Pericardial effusion 10/29/2012    moderate to large/notes 11/15/2012  . Anxiety   . Hepatitis   . Crohn's disease   . ED (erectile dysfunction)   . Cirrhosis     secondary to drug effect, +/- fatty liver     Family History  Problem Relation Age of Onset  . Diabetes Mother   . Diabetes Brother   . Healthy Daughter   . Healthy Son   .  Colon cancer Neg Hx      History   Social History  . Marital Status: Married    Spouse Name: N/A    Number of Children: 2  . Years of Education: N/A   Occupational History  . disabled/retired    Social History Main Topics  . Smoking status: Former Smoker -- 0.50 packs/day for 24 years    Types: Cigarettes    Start date: 10/28/2012    Quit date: 11/22/2012  . Smokeless tobacco: Current User    Types: Snuff     Comment: 1/2 pack a month  . Alcohol Use: No  . Drug Use: No  . Sexual Activity: Not Currently   Other Topics Concern  . None   Social History Narrative   Crohns h/o      This is a 39 y.o. year old male with Crohn's disease. He had symptoms of abdominal pain since he was 14 but was diagnosed in 2002-2004. He had an EGD at some point that showed esophageal, gastric and antral ulcers. It is not clear if this was related to Crohn's or peptic. He was treated with acid suppressive therapy without much luck so he had another EGD which was complicated by duodenal perforation that required surgical repair/gastrojejunostomy. His post-op course was complicated by wound infection. At some point he was diagnosed  with ileocolonic Crohn's disease. Historically he was treated with oral 5 ASA, prednisone, Imuran, MTX and anti-TNF therapy. He started with Remicade and was on it for 2 months without improvement so it was stopped and patient was switched to 6 MP. While on the 6 MP he had to be on steroids most of the time so 6 MP was discontinued. He had no side effects with the 6 MP. Around that time he was treated with MTX weekly injection, he said on and off for few years. He tells me that after that he tried Humira 6-12 months and Cimzia 6-12 month without prolonged response. It always seemed that he has initial response to the therapy that quickly wears off. He was re-challenged with Remicade around 2008 and stayed on it since then. His dose and dosing interval had to be changed multiple times  due to clinical symptoms. 2-3 months ago he had an anaphylactic reactions to Remicade x 2 despite that he was pre-medicated with 200 mg of hydrocortisone. His Remicade was stopped. No Remicade level or antibody was checked. It is not clear why he was not on IMM while on Remicade.       About 1-2 years he was diagnosed with cirrhosis, he tells me it's due to MTX although he was on weekly injections and only for few years. There is mention of possible NASH as an etiology seen on liver bx in the reviewed notes. He is followed at [facility] for his cirrhosis, his complications are mainly ascites and edema. He also has CRI. Multiple imaging studies including MRE and CTE showing active disease in TI. TPMT normal October 2014. EGD in 1/14 showed esophagitis, gastritis, PHG and gastrojejunostomy. 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 He had low-grade fever. No chest pain. Otherwise per the history and physical    Objective: Vital signs in last 24 hours: Temp:  [97.3 F (36.3 C)-98.6 F (37 C)] 98.2 F (36.8 C) (02/03 0730) Pulse Rate:  [96-128] 128 (02/03 0809) Resp:  [15-33] 24 (02/03 0809) BP: (85-128)/(50-72) 110/64 mmHg (02/03 0600) SpO2:  [88 %-99 %] 93 % (02/03 0809) FiO2 (%):  [80 %-85 %] 85 % (02/03 0809) Weight:  [87.091 kg (192 lb)-88.7 kg (195 lb 8.8 oz)] 88.7 kg (195 lb 8.8 oz) (02/03 0500) Weight change:  Last BM Date: 01/14/15  Intake/Output from previous day: 02/02 0701 - 02/03 0700 In: 540 [P.O.:240; IV Piggyback:300] Out: 1100 [Urine:1100]  PHYSICAL EXAM He is awake and alert tachypnea and dyspneic. His pupils react. His nose and throat are clear. His neck is supple.  His chest shows fine rales bilaterally in the bases mostly anteriorly. His heart is regular with tachycardia. His abdomen is soft he has multiple surgical scars and has a tube. Extremities show edema more of the left leg than the right. Central nervous system examination is grossly intact  Lab Results: Basic Metabolic Panel:  Recent Labs  16/10/96 1440 01/16/15 0442  NA 132* 135  K 4.5 4.7  CL 104 104  CO2 20 22  GLUCOSE 176* 85  BUN 62* 62*  CREATININE 1.38* 1.24  CALCIUM 7.3* 7.2*   Liver Function Tests:  Recent Labs  01/15/15 1440 01/16/15 0442  AST 164* 162*  ALT 94* 97*  ALKPHOS 125* 139*  BILITOT 1.9* 3.3*  PROT 5.6* 5.4*  ALBUMIN 2.7* 2.6*   No results for input(s): LIPASE, AMYLASE in the last 72 hours. No results for input(s): AMMONIA in the last 72 hours. CBC:  Recent Labs  01/15/15 1440 01/16/15 0442  WBC 10.4 9.1  NEUTROABS 10.0*  --   HGB 9.0* 9.5*  HCT 29.2* 30.9*  MCV 82.5 82.8  PLT 76* 57*   Cardiac Enzymes: No results for input(s): CKTOTAL, CKMB, CKMBINDEX, TROPONINI in the last 72 hours. BNP: No results for input(s): PROBNP in the last 72 hours. D-Dimer: No results for input(s): DDIMER in the last 72 hours. CBG: No results for input(s): GLUCAP in the last 72 hours. Hemoglobin A1C: No results for input(s): HGBA1C in the last 72 hours. Fasting Lipid Panel: No results for input(s): CHOL, HDL, LDLCALC, TRIG, CHOLHDL, LDLDIRECT in the last 72 hours. Thyroid Function Tests: No results for input(s): TSH, T4TOTAL, FREET4, T3FREE, THYROIDAB in the last 72 hours. Anemia Panel: No results for input(s): VITAMINB12, FOLATE, FERRITIN, TIBC, IRON, RETICCTPCT in the last 72 hours. Coagulation: No results for input(s): LABPROT, INR in the last 72 hours. Urine Drug Screen: Drugs of Abuse     Component Value Date/Time   LABOPIA NONE DETECTED 11/11/2011 1034   COCAINSCRNUR NONE DETECTED 11/11/2011 1034   LABBENZ POSITIVE* 11/11/2011 1034   AMPHETMU  NONE DETECTED 11/11/2011 1034   THCU NONE DETECTED 11/11/2011 1034   LABBARB NONE DETECTED 11/11/2011 1034    Alcohol Level: No results for input(s): ETH in the last 72 hours. Urinalysis: No results for input(s): COLORURINE, LABSPEC, PHURINE, GLUCOSEU, HGBUR, BILIRUBINUR, KETONESUR, PROTEINUR, UROBILINOGEN, NITRITE, LEUKOCYTESUR in the last 72 hours.  Invalid input(s): APPERANCEUR Misc. Labs:   ABGS:  Recent Labs  01/15/15 1832  PHART 7.304*  PO2ART 148.0*  TCO2 18.4  HCO3 19.2*     MICROBIOLOGY: Recent Results (from the past 240 hour(s))  MRSA PCR Screening     Status: None   Collection Time: 01/15/15  6:33 PM  Result Value Ref Range Status   MRSA by PCR NEGATIVE NEGATIVE Final    Comment:        The GeneXpert MRSA Assay (FDA approved for NASAL specimens only), is one component of a comprehensive MRSA colonization surveillance program. It is not intended to diagnose MRSA infection nor to guide or monitor treatment for MRSA infections.     Studies/Results: Dg Chest 2 View  01/15/2015   CLINICAL DATA:  Pneumonia  EXAM: CHEST  2 VIEW  COMPARISON:  12/26/2014  FINDINGS: Right upper extremity PICC, tip at the distal SVC.  Chronically low lung volumes with reticular nodular densities at the right base. There is new patchy airspace opacity in the bilateral anterior lungs. No edema, effusion, or pneumothorax. Normal heart size and mediastinal contours.  IMPRESSION: Bilateral pneumonia.   Electronically Signed   By: Tiburcio Pea M.D.   On: 01/15/2015 14:39    Medications:  Prior to Admission:  Prescriptions prior to admission  Medication Sig Dispense Refill Last Dose  . calcium-vitamin D (OSCAL 500/200 D-3) 500-200 MG-UNIT per tablet Take 1 tablet by mouth 2 (two) times daily.   01/15/2015 at Unknown time  . dicyclomine (BENTYL) 10 MG capsule Take 1 capsule (10 mg total) by mouth 3 (three) times daily as needed (stomach cramps). (Patient taking differently: Take 10 mg by  mouth daily as needed for spasms (stomach cramps). )  90 capsule 5 OVER 30 DAYS at Unknown time  . ferrous gluconate (FERGON) 324 MG tablet Take 324 mg by mouth 3 (three) times daily.   01/15/2015 at Unknown time  . fish oil-omega-3 fatty acids 1000 MG capsule Take 1 g by mouth 3 (three) times daily.   01/15/2015 at Unknown time  . metolazone (ZAROXOLYN) 5 MG tablet Take 1 tablet (5 mg total) by mouth 2 (two) times daily. 30 tablet 0 01/15/2015 at Unknown time  . oxyCODONE (ROXICODONE INTENSOL) 20 MG/ML concentrated solution Take 2.5 mLs (50 mg total) by mouth 4 (four) times daily. 300 mL 0 01/15/2015 at 800A  . pantoprazole (PROTONIX) 40 MG tablet Take 1 tablet (40 mg total) by mouth 2 (two) times daily. 60 tablet 5 01/15/2015 at Unknown time  . potassium chloride 20 MEQ/15ML (10%) SOLN Take 30 mLs (40 mEq total) by mouth 3 (three) times daily. 900 mL 0 01/15/2015 at Unknown time  . predniSONE (DELTASONE) 20 MG tablet Take 1 tablet (20 mg total) by mouth daily with breakfast. 30 tablet 0 01/15/2015 at Unknown time  . Probiotic Product (ALIGN) 4 MG CAPS Take 4 mg by mouth daily.    01/15/2015 at Unknown time  . promethazine (PHENERGAN) 25 MG tablet Take 1 tablet (25 mg total) by mouth 2 (two) times daily as needed. nausea 30 tablet 1 Past Week at Unknown time  . spironolactone (ALDACTONE) 25 MG tablet Take 1 tablet (25 mg total) by mouth 2 (two) times daily. 60 tablet 1 01/15/2015 at Unknown time  . sucralfate (CARAFATE) 1 G tablet Take 1 g by mouth 3 (three) times daily before meals.   01/15/2015 at Unknown time  . polyethylene glycol (MIRALAX / GLYCOLAX) packet Take 17 g by mouth daily as needed for mild constipation or moderate constipation.    OVE R30 DAYS   Scheduled: . ALIGN  4 mg Oral Daily  . antiseptic oral rinse  7 mL Mouth Rinse BID  . calcium-vitamin D  1 tablet Oral BID  . feeding supplement (ENSURE COMPLETE)  237 mL Oral BID BM  . ferrous gluconate  324 mg Oral TID  . guaiFENesin  1,200 mg Oral BID  .  metolazone  5 mg Oral BID  . omega-3 acid ethyl esters  1 g Oral TID  . oxyCODONE  50 mg Oral QID  . pantoprazole  40 mg Oral BID  . piperacillin-tazobactam (ZOSYN)  IV  3.375 g Intravenous 3 times per day  . potassium chloride  40 mEq Oral TID  . predniSONE  20 mg Oral Q breakfast  . spironolactone  25 mg Oral BID  . sucralfate  1 g Oral TID AC  . vancomycin  1,000 mg Intravenous Q12H   Continuous:  QAS:[id number], dicyclomine, polyethylene glycol, promethazine  Assesment: He has healthcare associated pneumonia and acute hypoxic respiratory failure. He did not tolerate BiPAP. This situation is complicated by his severe protein calorie malnutrition. I don't think he needs to be intubated now because he is able to sit up and have a conversation with me but he is short of breath and tachycardic. Principal Problem:   Healthcare-associated pneumonia Active Problems:   Hypertension   Liver cirrhosis   HCAP (healthcare-associated pneumonia)   Crohn's disease of both small and large intestine with complication   Protein-calorie malnutrition, severe   Aspiration pneumonia   Anemia of chronic disease   Acute kidney injury   Acute respiratory failure with hypoxia   Thrombocytopenia    Plan:  Continue current treatments. Add flutter valve. He's going to see if he can get something for pain.    LOS: 1 day   Trek Kimball L 01/16/2015, 8:56 AM

## 2015-01-16 NOTE — Telephone Encounter (Signed)
Lowella Bandy called and said David [name] is in the hospital and they have not given him anything for pain. Would like to Dr. Karilyn Cota to know.

## 2015-01-16 NOTE — Progress Notes (Signed)
Pt's ET tube needed to be advanced 4 cm and before we saw the xray the Pt started desating and we advanced the ET tube 2 and 1/2 cm and increased his PEEP to 15 to get his SATS higher and I also did a recruitment one time.Dr. Manus Rudd ordered another xray for tube placement again. VT 600, RR 16, FIO2 100% and 5 PEEP were the vent settings at 1255. Dr. Sherrie Mustache said when his SATS are normal decrease the PEEP. RT deceased the PEEP to 5 at 1255. Pt SATS are 97%. The ET tube is positional. Pt is stable.

## 2015-01-17 ENCOUNTER — Inpatient Hospital Stay (HOSPITAL_COMMUNITY): Payer: Medicare Other

## 2015-01-17 ENCOUNTER — Encounter (HOSPITAL_COMMUNITY): Payer: Self-pay

## 2015-01-17 DIAGNOSIS — B49 Unspecified mycosis: Secondary | ICD-10-CM | POA: Diagnosis present

## 2015-01-17 DIAGNOSIS — I10 Essential (primary) hypertension: Secondary | ICD-10-CM

## 2015-01-17 DIAGNOSIS — J9600 Acute respiratory failure, unspecified whether with hypoxia or hypercapnia: Secondary | ICD-10-CM | POA: Diagnosis present

## 2015-01-17 DIAGNOSIS — K7460 Unspecified cirrhosis of liver: Secondary | ICD-10-CM

## 2015-01-17 DIAGNOSIS — J80 Acute respiratory distress syndrome: Secondary | ICD-10-CM | POA: Diagnosis present

## 2015-01-17 DIAGNOSIS — D696 Thrombocytopenia, unspecified: Secondary | ICD-10-CM

## 2015-01-17 DIAGNOSIS — A419 Sepsis, unspecified organism: Secondary | ICD-10-CM | POA: Diagnosis present

## 2015-01-17 DIAGNOSIS — D638 Anemia in other chronic diseases classified elsewhere: Secondary | ICD-10-CM

## 2015-01-17 DIAGNOSIS — R6521 Severe sepsis with septic shock: Secondary | ICD-10-CM

## 2015-01-17 DIAGNOSIS — K5090 Crohn's disease, unspecified, without complications: Secondary | ICD-10-CM

## 2015-01-17 DIAGNOSIS — N179 Acute kidney failure, unspecified: Secondary | ICD-10-CM

## 2015-01-17 LAB — RENAL FUNCTION PANEL
Albumin: 2 g/dL — ABNORMAL LOW (ref 3.5–5.2)
Albumin: 2.4 g/dL — ABNORMAL LOW (ref 3.5–5.2)
Albumin: 2.3 g/dL — ABNORMAL LOW (ref 3.5–5.2)
Anion gap: 8 (ref 5–15)
Anion gap: 16 — ABNORMAL HIGH (ref 5–15)
Anion gap: 11 (ref 5–15)
BUN: 66 mg/dL — ABNORMAL HIGH (ref 6–23)
BUN: 69 mg/dL — ABNORMAL HIGH (ref 6–23)
BUN: 60 mg/dL — ABNORMAL HIGH (ref 6–23)
CALCIUM: 6.5 mg/dL — AB (ref 8.4–10.5)
CALCIUM: 7 mg/dL — AB (ref 8.4–10.5)
CHLORIDE: 108 mmol/L (ref 96–112)
CO2: 18 mmol/L — ABNORMAL LOW (ref 19–32)
CO2: 22 mmol/L (ref 19–32)
CO2: 34 mmol/L — AB (ref 19–32)
CREATININE: 2.12 mg/dL — AB (ref 0.50–1.35)
CREATININE: 1.8 mg/dL — AB (ref 0.50–1.35)
Calcium: 5.8 mg/dL — CL (ref 8.4–10.5)
Chloride: 94 mmol/L — ABNORMAL LOW (ref 96–112)
Chloride: 88 mmol/L — ABNORMAL LOW (ref 96–112)
Creatinine, Ser: 1.99 mg/dL — ABNORMAL HIGH (ref 0.50–1.35)
GFR calc Af Amer: 44 mL/min — ABNORMAL LOW (ref >90)
GFR calc Af Amer: 53 mL/min — ABNORMAL LOW (ref >90)
GFR calc non Af Amer: 46 mL/min — ABNORMAL LOW (ref >90)
GFR, EST AFRICAN AMERICAN: 47 mL/min — AB (ref >90)
GFR, EST NON AFRICAN AMERICAN: 41 mL/min — AB (ref >90)
GFR, EST NON AFRICAN AMERICAN: 38 mL/min — AB (ref >90)
GLUCOSE: 131 mg/dL — AB (ref 70–99)
GLUCOSE: 160 mg/dL — AB (ref 70–99)
GLUCOSE: 165 mg/dL — AB (ref 70–99)
PHOSPHORUS: 6.1 mg/dL — AB (ref 2.3–4.6)
Phosphorus: 6.3 mg/dL — ABNORMAL HIGH (ref 2.3–4.6)
Phosphorus: 5.4 mg/dL — ABNORMAL HIGH (ref 2.3–4.6)
Potassium: 5.5 mmol/L — ABNORMAL HIGH (ref 3.5–5.1)
Potassium: 5.1 mmol/L (ref 3.5–5.1)
Potassium: 4.3 mmol/L (ref 3.5–5.1)
Sodium: 134 mmol/L — ABNORMAL LOW (ref 135–145)
Sodium: 132 mmol/L — ABNORMAL LOW (ref 135–145)
Sodium: 133 mmol/L — ABNORMAL LOW (ref 135–145)

## 2015-01-17 LAB — POCT I-STAT 3, ART BLOOD GAS (G3+)
ACID-BASE DEFICIT: 1 mmol/L (ref 0.0–2.0)
Acid-Base Excess: 12 mmol/L — ABNORMAL HIGH (ref 0.0–2.0)
Bicarbonate: 24 mEq/L (ref 20.0–24.0)
Bicarbonate: 36.6 mEq/L — ABNORMAL HIGH (ref 20.0–24.0)
O2 Saturation: 97 %
O2 Saturation: 96 %
PH ART: 7.377 (ref 7.350–7.450)
PH ART: 7.526 — AB (ref 7.350–7.450)
Patient temperature: 97.4
Patient temperature: 97.5
TCO2: 25 mmol/L (ref 0–100)
TCO2: 38 mmol/L (ref 0–100)
pCO2 arterial: 40.7 mmHg (ref 35.0–45.0)
pCO2 arterial: 44 mmHg (ref 35.0–45.0)
pO2, Arterial: 89 mmHg (ref 80.0–100.0)
pO2, Arterial: 72 mmHg — ABNORMAL LOW (ref 80.0–100.0)

## 2015-01-17 LAB — LEGIONELLA ANTIGEN, URINE

## 2015-01-17 LAB — TSH: TSH: 1.245 u[IU]/mL (ref 0.350–4.500)

## 2015-01-17 LAB — HEPATIC FUNCTION PANEL
ALK PHOS: 140 U/L — AB (ref 39–117)
ALT: 113 U/L — AB (ref 0–53)
AST: 280 U/L — ABNORMAL HIGH (ref 0–37)
Albumin: 2.4 g/dL — ABNORMAL LOW (ref 3.5–5.2)
BILIRUBIN DIRECT: 3.3 mg/dL — AB (ref 0.0–0.5)
BILIRUBIN INDIRECT: 1.7 mg/dL — AB (ref 0.3–0.9)
BILIRUBIN TOTAL: 5 mg/dL — AB (ref 0.3–1.2)
TOTAL PROTEIN: 5 g/dL — AB (ref 6.0–8.3)

## 2015-01-17 LAB — GLUCOSE, CAPILLARY
GLUCOSE-CAPILLARY: 147 mg/dL — AB (ref 70–99)
GLUCOSE-CAPILLARY: 157 mg/dL — AB (ref 70–99)
Glucose-Capillary: 131 mg/dL — ABNORMAL HIGH (ref 70–99)
Glucose-Capillary: 160 mg/dL — ABNORMAL HIGH (ref 70–99)
Glucose-Capillary: 246 mg/dL — ABNORMAL HIGH (ref 70–99)
Glucose-Capillary: 276 mg/dL — ABNORMAL HIGH (ref 70–99)

## 2015-01-17 LAB — HEPATITIS PANEL, ACUTE
HCV Ab: NEGATIVE
HEP A IGM: NONREACTIVE
Hep B C IgM: NONREACTIVE
Hepatitis B Surface Ag: NEGATIVE

## 2015-01-17 LAB — PREALBUMIN: Prealbumin: <3 mg/dL — ABNORMAL LOW (ref 17.0–34.0)

## 2015-01-17 LAB — COMPREHENSIVE METABOLIC PANEL
ALBUMIN: 2.2 g/dL — AB (ref 3.5–5.2)
ALK PHOS: 125 U/L — AB (ref 39–117)
ALT: 115 U/L — ABNORMAL HIGH (ref 0–53)
AST: 287 U/L — ABNORMAL HIGH (ref 0–37)
Anion gap: 13 (ref 5–15)
BUN: 74 mg/dL — ABNORMAL HIGH (ref 6–23)
CO2: 16 mmol/L — ABNORMAL LOW (ref 19–32)
Calcium: 6.6 mg/dL — ABNORMAL LOW (ref 8.4–10.5)
Chloride: 103 mmol/L (ref 96–112)
Creatinine, Ser: 2.32 mg/dL — ABNORMAL HIGH (ref 0.50–1.35)
GFR calc non Af Amer: 34 mL/min — ABNORMAL LOW (ref >90)
GFR, EST AFRICAN AMERICAN: 39 mL/min — AB (ref >90)
Glucose, Bld: 139 mg/dL — ABNORMAL HIGH (ref 70–99)
Potassium: 6.2 mmol/L (ref 3.5–5.1)
Sodium: 132 mmol/L — ABNORMAL LOW (ref 135–145)
TOTAL PROTEIN: 4.9 g/dL — AB (ref 6.0–8.3)
Total Bilirubin: 4.8 mg/dL — ABNORMAL HIGH (ref 0.3–1.2)

## 2015-01-17 LAB — TRIGLYCERIDES: TRIGLYCERIDES: 268 mg/dL — AB (ref <150)

## 2015-01-17 LAB — MAGNESIUM
MAGNESIUM: 1.7 mg/dL (ref 1.5–2.5)
Magnesium: 1.9 mg/dL (ref 1.5–2.5)

## 2015-01-17 LAB — HIV ANTIBODY (ROUTINE TESTING W REFLEX): HIV SCREEN 4TH GENERATION: NONREACTIVE

## 2015-01-17 LAB — PHOSPHORUS: Phosphorus: 6.8 mg/dL — ABNORMAL HIGH (ref 2.3–4.6)

## 2015-01-17 LAB — PROTIME-INR
INR: 1.78 — ABNORMAL HIGH (ref 0.00–1.49)
PROTHROMBIN TIME: 20.8 s — AB (ref 11.6–15.2)

## 2015-01-17 LAB — POTASSIUM: Potassium: 6.8 mmol/L (ref 3.5–5.1)

## 2015-01-17 LAB — AMMONIA: Ammonia: 92 umol/L — ABNORMAL HIGH (ref 11–32)

## 2015-01-17 MED ORDER — SODIUM CHLORIDE 0.9 % IV SOLN
1.0000 mg/kg/h | INTRAVENOUS | Status: DC
  Administered 2015-01-17: 1 mg/kg/h via INTRAVENOUS
  Filled 2015-01-17 (×3): qty 100

## 2015-01-17 MED ORDER — PIPERACILLIN-TAZOBACTAM 3.375 G IVPB 30 MIN
3.3750 g | Freq: Four times a day (QID) | INTRAVENOUS | Status: DC
  Administered 2015-01-17 – 2015-01-21 (×19): 3.375 g via INTRAVENOUS
  Filled 2015-01-17 (×22): qty 50

## 2015-01-17 MED ORDER — SODIUM CHLORIDE 0.9 % IV SOLN
1.0000 g | Freq: Once | INTRAVENOUS | Status: AC
  Administered 2015-01-17: 1 g via INTRAVENOUS
  Filled 2015-01-17: qty 10

## 2015-01-17 MED ORDER — SODIUM CHLORIDE 0.9 % IV SOLN
0.0000 mg/h | INTRAVENOUS | Status: DC
  Administered 2015-01-17 – 2015-01-18 (×2): 2 mg/h via INTRAVENOUS
  Filled 2015-01-17 (×2): qty 10

## 2015-01-17 MED ORDER — HEPARIN SODIUM (PORCINE) 1000 UNIT/ML DIALYSIS
1000.0000 [IU] | INTRAMUSCULAR | Status: DC | PRN
  Administered 2015-01-17: 2400 [IU] via INTRAVENOUS_CENTRAL
  Filled 2015-01-17 (×2): qty 6
  Filled 2015-01-17: qty 4

## 2015-01-17 MED ORDER — PRISMASOL BGK 4/2.5 32-4-2.5 MEQ/L IV SOLN
INTRAVENOUS | Status: DC
  Administered 2015-01-17 – 2015-01-21 (×30): via INTRAVENOUS_CENTRAL
  Filled 2015-01-17 (×41): qty 5000

## 2015-01-17 MED ORDER — PHENYLEPHRINE HCL 10 MG/ML IJ SOLN
30.0000 ug/min | INTRAVENOUS | Status: DC
  Administered 2015-01-17: 25 ug/min via INTRAVENOUS
  Administered 2015-01-17: 35 ug/min via INTRAVENOUS
  Filled 2015-01-17 (×2): qty 2

## 2015-01-17 MED ORDER — SODIUM BICARBONATE 8.4 % IV SOLN
INTRAVENOUS | Status: DC
  Administered 2015-01-17 (×6): via INTRAVENOUS_CENTRAL
  Filled 2015-01-17 (×12): qty 150

## 2015-01-17 MED ORDER — STERILE WATER FOR INJECTION IV SOLN
INTRAVENOUS | Status: DC
  Administered 2015-01-17 (×7): via INTRAVENOUS_CENTRAL
  Filled 2015-01-17 (×13): qty 150

## 2015-01-17 MED ORDER — FENTANYL BOLUS VIA INFUSION
50.0000 ug | INTRAVENOUS | Status: DC | PRN
  Filled 2015-01-17: qty 50

## 2015-01-17 MED ORDER — VANCOMYCIN HCL IN DEXTROSE 1-5 GM/200ML-% IV SOLN
1000.0000 mg | INTRAVENOUS | Status: DC
  Administered 2015-01-17 – 2015-01-19 (×3): 1000 mg via INTRAVENOUS
  Filled 2015-01-17 (×4): qty 200

## 2015-01-17 MED ORDER — MIDAZOLAM BOLUS VIA INFUSION
1.0000 mg | INTRAVENOUS | Status: DC | PRN
  Filled 2015-01-17: qty 2

## 2015-01-17 MED ORDER — PRISMASOL BGK 4/2.5 32-4-2.5 MEQ/L IV SOLN
INTRAVENOUS | Status: DC
  Administered 2015-01-17 – 2015-01-20 (×4): via INTRAVENOUS_CENTRAL
  Filled 2015-01-17 (×8): qty 5000

## 2015-01-17 MED ORDER — INSULIN ASPART 100 UNIT/ML ~~LOC~~ SOLN
0.0000 [IU] | SUBCUTANEOUS | Status: DC
  Administered 2015-01-17 (×2): 3 [IU] via SUBCUTANEOUS
  Administered 2015-01-17: 8 [IU] via SUBCUTANEOUS
  Administered 2015-01-17 – 2015-01-18 (×5): 5 [IU] via SUBCUTANEOUS
  Administered 2015-01-18: 8 [IU] via SUBCUTANEOUS
  Administered 2015-01-19 (×2): 5 [IU] via SUBCUTANEOUS

## 2015-01-17 MED ORDER — "THROMBI-PAD 3""X3"" EX PADS"
1.0000 | MEDICATED_PAD | Freq: Once | CUTANEOUS | Status: DC
  Filled 2015-01-17: qty 1

## 2015-01-17 MED ORDER — SODIUM CHLORIDE 0.9 % IV SOLN
0.0000 ug/h | INTRAVENOUS | Status: DC
  Administered 2015-01-17 – 2015-01-18 (×2): 50 ug/h via INTRAVENOUS
  Administered 2015-01-19: 200 ug/h via INTRAVENOUS
  Administered 2015-01-19: 100 ug/h via INTRAVENOUS
  Administered 2015-01-21: 150 ug/h via INTRAVENOUS
  Filled 2015-01-17 (×7): qty 50

## 2015-01-17 MED ORDER — HEPARIN SODIUM (PORCINE) 1000 UNIT/ML DIALYSIS: 1000.0000 [IU] | INTRAMUSCULAR | Status: DC | PRN

## 2015-01-17 MED ORDER — HEPARIN (PORCINE) 2000 UNITS/L FOR CRRT
INTRAVENOUS_CENTRAL | Status: DC | PRN
  Administered 2015-01-17 – 2015-01-18 (×2): via INTRAVENOUS_CENTRAL
  Filled 2015-01-17 (×3): qty 1000

## 2015-01-17 MED ORDER — FENTANYL CITRATE 0.05 MG/ML IJ SOLN: 50.0000 ug | Freq: Once | INTRAMUSCULAR | Status: DC

## 2015-01-17 MED ORDER — M.V.I. ADULT IV INJ
INTRAVENOUS | Status: AC
  Administered 2015-01-17: 18:00:00 via INTRAVENOUS
  Filled 2015-01-17: qty 2880

## 2015-01-17 MED ORDER — PHENYLEPHRINE HCL 10 MG/ML IJ SOLN
30.0000 ug/min | INTRAVENOUS | Status: DC
  Administered 2015-01-17: 10 ug/min via INTRAVENOUS
  Administered 2015-01-17: 45 ug/min via INTRAVENOUS
  Filled 2015-01-17 (×2): qty 1

## 2015-01-17 MED ORDER — VASOPRESSIN 20 UNIT/ML IV SOLN
0.0300 [IU]/min | INTRAVENOUS | Status: DC
  Administered 2015-01-17 – 2015-01-19 (×3): 0.03 [IU]/min via INTRAVENOUS
  Filled 2015-01-17 (×3): qty 2

## 2015-01-17 MED ORDER — PRISMASOL BGK 4/2.5 32-4-2.5 MEQ/L IV SOLN
INTRAVENOUS | Status: DC
  Administered 2015-01-17 – 2015-01-21 (×10): via INTRAVENOUS_CENTRAL
  Filled 2015-01-17 (×15): qty 5000

## 2015-01-17 NOTE — Progress Notes (Signed)
CRITICAL VALUE ALERT  Critical value received: Potassium 7.0  Date of notification: 01/16/15  Time of notification: 2005  Critical value read back: yes  Nurse who received alert: Brayton Caves, RN  MD notified (1st page): Dr. Dema Severin (CCM)  Time of first page: 2010  MD notified (2nd page):  Time of second page:  Responding MD: Dr. Dema Severin  Time MD responded: 2010

## 2015-01-17 NOTE — Progress Notes (Signed)
CRITICAL VALUE ALERT  Critical value received: Potassium 6.8  Date of notification: 01/17/15  Time of notification: 0110  Critical value read back: yes  Nurse who received alert: Brayton Caves, RN  MD notified (1st page): Joneen Roach, NP  Time of first page: 0110  MD notified (2nd page):  Time of second page:  Responding MD: Joneen Roach, NP  Time MD responded: [phone]

## 2015-01-17 NOTE — Progress Notes (Signed)
CRITICAL VALUE ALERT  Critical value received:  Ca 5.8  Date of notification:  01/17/15  Time of notification:  0443  Critical value read back:Yes.    Nurse who received alert:  Alfonso Ellis, RN  MD notified (1st page):  Dr. Craige Cotta (called in [facility])  Time of first page:  [phone]

## 2015-01-17 NOTE — Progress Notes (Signed)
CRITICAL VALUE ALERT  Critical value received: Potassium 7.2  Date of notification: 01/16/15  Time of notification: 2215  Critical value read back: yes  Nurse who received alert: Brayton Caves, RN  MD notified (1st page): Joneen Roach, NP  Time of first page: 2215   MD notified (2nd page):  Time of second page:  Responding MD: Joneen Roach, NP  Time MD responded: 2215

## 2015-01-17 NOTE — Progress Notes (Signed)
UR Completed.  [phone]  

## 2015-01-17 NOTE — Progress Notes (Signed)
R PICC D/C per MD order. Vaseline pressure dsg applied, pressure x5 min, no bleeding noted.

## 2015-01-17 NOTE — Progress Notes (Signed)
Dr. Lindie Spruce notified of Ct abd/pelvis results.  Roselie Awkward, RN

## 2015-01-17 NOTE — Progress Notes (Addendum)
Subjective: Pt with no acute changes from admission. Vasopressin started this AM  Objective: Vital signs in last 24 hours: Temp:  [97.4 F (36.3 C)-99.5 F (37.5 C)] 97.4 F (36.3 C) (02/04 0724) Pulse Rate:  [69-131] 70 (02/04 0724) Resp:  [16-37] 35 (02/04 0724) BP: (69-133)/(24-100) 108/59 mmHg (02/04 0700) SpO2:  [84 %-98 %] 97 % (02/04 0724) Arterial Line BP: (83-125)/(36-56) 118/54 mmHg (02/04 0724) FiO2 (%):  [50 %-100 %] 50 % (02/04 0421) Weight:  [196 lb 3.4 oz (89 kg)-201 lb 1 oz (91.2 kg)] 196 lb 3.4 oz (89 kg) (02/04 0500) Last BM Date: 01/16/15  Intake/Output from previous day: 02/03 0701 - 02/04 0700 In: 2273.6 [I.V.:1723.6; IV Piggyback:550] Out: 1647 [Urine:335; Emesis/NG output:285; Drains:450] Intake/Output this shift: Total I/O In: 180.8 [I.V.:150.8; Other:10; NG/GT:20] Out: 190 [Urine:60; Other:130]  General appearance: sedated GI: soft, distended, hypoactive BS, no guarding, Gtube in place  Lab Results:   Recent Labs  01/16/15 0442 01/16/15 1904 01/16/15 2340  WBC 9.1 31.1*  --   HGB 9.5* 10.3* 10.5*  HCT 30.9* 33.2* 31.0*  PLT 57* 120*  --    BMET  Recent Labs  01/16/15 2125 01/16/15 2340 01/17/15 0020 01/17/15 0345  NA 131* 132*  --  134*  132*  K 7.2* 6.7* 6.8* 5.5*  6.2*  CL 103  --   --  108  103  CO2 14*  --   --  18*  16*  GLUCOSE 93 113*  --  131*  139*  BUN 72*  --   --  66*  74*  CREATININE 2.29*  --   --  1.99*  2.32*  CALCIUM 6.8*  --   --  5.8*  6.6*   PT/INR  Recent Labs  01/17/15 0345  LABPROT 20.8*  INR 1.78*   ABG  Recent Labs  01/16/15 2047 01/17/15 0723  PHART 7.174* 7.377  HCO3 15.5* 24.0    Studies/Results: Ct Abdomen Pelvis Wo Contrast  01/17/2015   CLINICAL DATA:  Septic shock. History of Crohn's disease. Worsening acidosis and leukocytosis. Renal failure.  EXAM: CT ABDOMEN AND PELVIS WITHOUT CONTRAST  TECHNIQUE: Multidetector CT imaging of the abdomen and pelvis was performed  following the standard protocol without IV contrast.  COMPARISON:  Most recent CT 12/30/2014  FINDINGS: Bilateral airspace disease in the lower lobes, most significant in the left lower lobe with combination of ground-glass and confluent opacities.  Gastrostomy tube in the stomach. Tip of the enteric tube in the stomach. There is abnormal appearance of small bowel in the lower abdomen. There is diffuse small bowel thickening, mesenteric edema and mesenteric free fluid. These decreased bowel distention compared to prior. There is questionable pneumatosis involving small bowel loops in the central abdomen, axial image number 55/114. There is diffuse colonic wall thickening involving the splenic flexure through the sigmoid colon that has likely progressed from prior exam. There is diffuse colonic wall thickening of the ascending colon that is also progressed. No definite perforation or free air.  The spleen is enlarged measuring 20 cm in greatest dimension with a volume of 2025 mL. There is enlargement of the left hepatic lobe. Periportal edema is again seen. Clips in the gallbladder fossa from cholecystectomy. Pancreas is grossly unremarkable. Adrenal glands are normal. Cystic lesion adjacent of the superior right kidney abutting the right lower liver, unchanged. There is decreased hydronephrosis from prior.  Mesenteric edema and free fluid, with ascites in the pelvis. The bladder is decompressed by Foley catheter.  There is diffuse whole body wall edema consistent with third spacing.  No acute osseous abnormality.  IMPRESSION: 1. Diffuse abnormal appearance of the bowel with multifocal bowel wall thick in. There is probable pneumatosis involving small bowel in the lower abdomen. No definite perforation. There is increased mesenteric edema and free fluid from prior exam. 2. Decreased hydronephrosis. 3. Splenomegaly. 4. Worsening airspace disease in the lower lungs, left greater than right.  These results of pneumatosis  were called by telephone at the time of interpretation on 01/17/2015 at 3:38 am to Dr. Craige Cotta, who verbally acknowledged these results.   Electronically Signed   By: Rubye Oaks M.D.   On: 01/17/2015 03:39   Dg Chest 2 View  01/15/2015   CLINICAL DATA:  Pneumonia  EXAM: CHEST  2 VIEW  COMPARISON:  12/26/2014  FINDINGS: Right upper extremity PICC, tip at the distal SVC.  Chronically low lung volumes with reticular nodular densities at the right base. There is new patchy airspace opacity in the bilateral anterior lungs. No edema, effusion, or pneumothorax. Normal heart size and mediastinal contours.  IMPRESSION: Bilateral pneumonia.   Electronically Signed   By: Tiburcio Pea M.D.   On: 01/15/2015 14:39   Dg Chest Port 1 View  01/17/2015   CLINICAL DATA:  History of aspiration pneumonia, septic shock. , hepatic cirrhosis, respiratory failure.  EXAM: PORTABLE CHEST - 1 VIEW  COMPARISON:  Portable chest x-ray of January 17, 2015 at 1:24 a.m.  FINDINGS: There is persistent confluent bilateral airspace disease not greatly changed from the earlier study. There is relative sparing of the lung bases. The cardiopericardial silhouette is top-normal in size. The pulmonary vascularity is prominent centrally but stable. The endotracheal tube tip lies 4.3 cm above the crotch of the carina. The esophagogastric tube tip projects below the inferior margin of the image. The large caliber right internal jugular catheter tip projects over the proximal portion of the SVC. The right-sided PICC line tip projects over the midportion of the SVC.  IMPRESSION: Stable appearance of the chest with confluent bilateral alveolar opacities consistent with pneumonia, edema, or other alveolar filling process. The support tubes and lines are in appropriate position radiographically.   Electronically Signed   By: David  [place]   On: 01/17/2015 07:58   Dg Chest Port 1 View  01/17/2015   CLINICAL DATA:  Hemodialysis catheter placement.  EXAM:  PORTABLE CHEST - 1 VIEW  COMPARISON:  One day prior at 1854 hr  FINDINGS: AP view at 0124 hr: Right dialysis catheter in the mid -distal SVC. There is no pneumothorax. Right upper extremity PICC tip remains in the distal SVC. Endotracheal tube of the thoracic inlet. Enteric tube in place, tip and side port below the diaphragm. The heart size is normal. Bilateral airspace disease, left greater than right, slightly worsened from prior. Question blunting of left costophrenic angle.  IMPRESSION: 1. Tip of the dialysis catheter in the mid SVC.  No pneumothorax. 2. Worsening bilateral airspace disease, pneumonia versus edema.   Electronically Signed   By: Rubye Oaks M.D.   On: 01/17/2015 02:48   Dg Chest Port 1 View  01/16/2015   CLINICAL DATA:  Encounter for intubation  EXAM: PORTABLE CHEST - 1 VIEW  COMPARISON:  01/16/2015  FINDINGS: Endotracheal tube tip is at the clavicular heads. A gastric suction tube enters the stomach. Stable right upper extremity PICC, tip at the upper cavoatrial junction.  Heart size and aortic contours remain normal. Bilateral airspace disease, asymmetric to  the left, is unchanged. No increasing pleural fluid or air leak.  IMPRESSION: 1. Stable positioning of tubes and central line. 2. Unchanged bilateral pneumonia and/or noncardiogenic edema.   Electronically Signed   By: Tiburcio Pea M.D.   On: 01/16/2015 19:14   Dg Chest Port 1 View  01/16/2015   CLINICAL DATA:  Respiratory failure  EXAM: PORTABLE CHEST - 1 VIEW  COMPARISON:  January 16, 2015 study obtained earlier in the day  FINDINGS: Endotracheal tube tip is 12.3 cm above the carina. Nasogastric tube tip and side port are in the stomach. Central catheter tip is in the superior vena cava just superior to the cavoatrial junction. No pneumothorax. There is widespread interstitial and patchy alveolar edema bilaterally, essentially stable. Heart is mildly enlarged with pulmonary venous hypertension. No adenopathy.  IMPRESSION:  Tube and catheter positions as described without pneumothorax. Note that the endotracheal tube tip is 12.3 cm above the carina. The appearance is consistent with congestive heart failure, likely with superimposed ARDS. The appearance of the lungs and cardiac silhouette is essentially stable compared to earlier in the day.  Critical Value/emergent results were called by telephone at the time of interpretation on 01/16/2015 at 5:30 pm to Armida Sans, RN, who verbally acknowledged these results.   Electronically Signed   By: Bretta Bang M.D.   On: 01/16/2015 17:30   Portable Chest Xray  01/16/2015   CLINICAL DATA:  Endotracheal tube placement.  EXAM: PORTABLE CHEST - 1 VIEW  COMPARISON:  Earlier the same date and 01/15/2015.  FINDINGS: 1222 hr. Endotracheal tube tip is just below the thoracic inlet, approximately 8 cm above the carina. Nasogastric tube projects below the diaphragm, tip not visualized. Right arm PICC appears unchanged near the SVC right atrial junction. There has been further worsening of the left greater than right bilateral airspace opacities with associated air bronchograms. There is no significant pleural effusion. The heart size and mediastinal contours are stable.  IMPRESSION: Endotracheal tube tip is just below the thoracic inlet. This could be advanced approximately 4 cm for more optimal positioning. Progression of bilateral airspace opacities worrisome for ARDS.   Electronically Signed   By: Roxy Horseman M.D.   On: 01/16/2015 12:35   Dg Chest Port 1 View  01/16/2015   CLINICAL DATA:  Pneumonia.  EXAM: PORTABLE CHEST - 1 VIEW  COMPARISON:  01/15/2015 and 12/26/2014  FINDINGS: There has been marked progression of bilateral pulmonary infiltrates particularly in the left upper lobe. PICC appears in good position. Heart size and vascularity are normal. I suspect there are small bilateral pleural effusions.  IMPRESSION: Marked progression of extensive bilateral pulmonary infiltrates.  Probable new effusions.   Electronically Signed   By: Geanie Cooley M.D.   On: 01/16/2015 11:16   Dg Chest Port 1v Same Day  01/16/2015   CLINICAL DATA:  Endotracheal tube repositioning.  Initial encounter.  EXAM: PORTABLE CHEST - 1 VIEW SAME DAY  COMPARISON:  Earlier the same day.  FINDINGS: 1258 hr. The endotracheal tube partially overlaps the nasogastric tube and is not optimally visualized, although appears to extend to approximately 3.1 cm above the carina. The right arm PICC and nasogastric tube are unchanged. Left greater than right airspace opacities have not significantly progressed. There is no evidence of pneumothorax.  IMPRESSION: Apparent repositioning of the endotracheal tube in the mid trachea (not well seen). No significant change in bilateral airspace opacities.   Electronically Signed   By: Roxy Horseman M.D.   On:  01/16/2015 13:38   Dg Abd Portable 1v  01/16/2015   CLINICAL DATA:  39 year old male with 1 day history of abdominal pain and distension  EXAM: PORTABLE ABDOMEN - 1 VIEW  COMPARISON:  Prior CT abdomen/pelvis 12/30/2014 ; prior acute abdominal series 113 2016  FINDINGS: Percutaneous gastrostomy tube projects over the right upper quadrant in similar position compared to prior imaging. Additionally, a nasogastric tube appears present in the more proximal aspect of the stomach. The bowel gas pattern is not obstructed on this single view. No large free air although this is a supine study. A femoral vascular catheter projects over the right hip.  IMPRESSION: 1. Nonobstructed bowel gas pattern. 2. Nasogastric tube and percutaneous gastrostomy tubes project over the stomach.   Electronically Signed   By: Malachy Moan M.D.   On: 01/16/2015 20:52    Anti-infectives: Anti-infectives    Start     Dose/Rate Route Frequency Ordered Stop   01/17/15 1800  vancomycin (VANCOCIN) IVPB 1000 mg/200 mL premix     1,000 mg200 mL/hr over 60 Minutes Intravenous Every 24 hours 01/17/15 0140      01/17/15 0600  piperacillin-tazobactam (ZOSYN) IVPB 3.375 g     3.375 g100 mL/hr over 30 Minutes Intravenous 4 times per day 01/17/15 0140     01/16/15 2300  micafungin (MYCAMINE) 100 mg in sodium chloride 0.9 % 100 mL IVPB     100 mg100 mL/hr over 1 Hours Intravenous Daily at bedtime 01/16/15 2140     01/16/15 2200  piperacillin-tazobactam (ZOSYN) IVPB 3.375 g  Status:  Discontinued     3.375 g12.5 mL/hr over 240 Minutes Intravenous 3 times per day 01/16/15 1733 01/17/15 0133   01/16/15 1800  vancomycin (VANCOCIN) IVPB 1000 mg/200 mL premix  Status:  Discontinued     1,000 mg200 mL/hr over 60 Minutes Intravenous Every 8 hours 01/16/15 1733 01/17/15 0134   01/16/15 0400  vancomycin (VANCOCIN) IVPB 1000 mg/200 mL premix  Status:  Discontinued     1,000 mg200 mL/hr over 60 Minutes Intravenous Every 12 hours 01/15/15 1836 01/16/15 1710   01/15/15 2200  piperacillin-tazobactam (ZOSYN) IVPB 3.375 g  Status:  Discontinued     3.375 g12.5 mL/hr over 240 Minutes Intravenous 3 times per day 01/15/15 1853 01/16/15 1710   01/15/15 1600  vancomycin (VANCOCIN) IVPB 1000 mg/200 mL premix     1,000 mg200 mL/hr over 60 Minutes Intravenous  Once 01/15/15 1557 01/15/15 1754   01/15/15 1500  ceFEPIme (MAXIPIME) 2 g in dextrose 5 % 50 mL IVPB     2 g100 mL/hr over 30 Minutes Intravenous  Once 01/15/15 1458 01/15/15 1540   01/15/15 1500  clindamycin (CLEOCIN) IVPB 600 mg     600 mg100 mL/hr over 30 Minutes Intravenous  Once 01/15/15 1458 01/15/15 1537      Assessment/Plan: 39 y/o M with septic shock possible aspiration PNA.   CT results show thickened bowel wall, possible pneumotosis, and mesenteric thickening. According to his November 2015 stay at [facility], an exlap was attempted for SBO, but aborted after the inability to gain access to his abd for 2 hours.  A decompressive Gtube was placed thereafter. I doubt there has been much change in his intraabd adhesions since his last surgery.  Patient remains a  poor operative candidate secondary to his surgical hx and current condition.  At this time I would recommend Con't abx adn supportive care.  CC time: 7:45-8:30   LOS: 2 days    Marigene Ehlers.,  Jed Limerick 01/17/2015

## 2015-01-17 NOTE — Progress Notes (Signed)
INITIAL NUTRITION ASSESSMENT  DOCUMENTATION CODES Per approved criteria  -Not Applicable   INTERVENTION:  TPN per Pharmacy to meet 100% of nutrition needs.   NUTRITION DIAGNOSIS: Inadequate oral intake related to altered GI function and inability to eat as evidenced by NPO status. Ongoing.  Goal: Intake to meet >90% of estimated nutrition needs.  Monitor:  TPN tolerance/adequacy, weight trend, labs, vent status.  Reason for Assessment: VDRF; TPN  39 y.o. male  Admitting Dx: Healthcare-associated pneumonia  ASSESSMENT: Patient presented to [facility] on 2/2 with 2 days of dyspnea, cough, and fever. Transferred to [facility] on 2/3 with progressive respiratory failure requiring intubation. 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 Being evaluated at [facility] for small bowel transplant.  CT scan on 2/4 showed significant large bowel wall thickening, potential areas of central SB pneumatosis. CXR on 2/3 showed progression of bilateral opacities worrisome for ARDS. ARDS protocol started 2/3. CRRT started 2/3.  Patient is receiving TPN with Clinimix 5/15 @ 120 ml/hr and lipids @ 10 ml/hr on Mon/Wed/Fri. Provides 2250 kcal, and 144 grams protein per day. Meets 100% minimum estimated energy needs and 100% minimum estimated protein needs.  Patient is currently intubated on ventilator support MV: 17 L/min Temp (24hrs), Avg:98.1 F (36.7 C), Min:96.4 F (35.8 C), Max:99.5 F (37.5 C)  Propofol: none   Height: Ht Readings from Last 1 Encounters:  01/16/15  (1.803 m)    Weight: Wt Readings from Last 1 Encounters:  01/17/15 196 lb 3.4 oz (89 kg)    Ideal Body Weight: 78.2 kg  % Ideal Body Weight: 114%  Wt Readings from Last 10 Encounters:  01/17/15 196 lb 3.4 oz (89 kg)  01/15/15 192 lb (87.091 kg)  01/01/15 178 lb (80.74 kg)  12/13/14 174 lb (78.926 kg)  11/27/14 163 lb 4.8 oz (74.072 kg)  10/15/14 195 lb (88.451 kg)   09/26/14 211 lb 8 oz (95.936 kg)  08/21/14 193 lb 6.4 oz (87.726 kg)  06/18/14 193 lb 6.4 oz (87.726 kg)  05/14/14 214 lb 3.2 oz (97.16 kg)    Usual Body Weight: 163 lbs (2 months ago), 178 lbs last admission  % Usual Body Weight: 110-120%  BMI:  Body mass index is 27.38 kg/(m^2).  Estimated Nutritional Needs: Kcal: 2260 Protein: 140-160 gm Fluid: 2.3 L  Skin: stage 2 sacral pressure ulcer  Diet Order: Diet NPO time specified TPN (CLINIMIX) Adult without lytes  EDUCATION NEEDS: -Education not appropriate at this time   Intake/Output Summary (Last 24 hours) at 01/17/15 1259 Last data filed at 01/17/15 1200  Gross per 24 hour  Intake 3163.32 ml  Output   2683 ml  Net 480.32 ml    Last BM: 2/4   Labs:   Recent Labs Lab 01/16/15 2125 01/16/15 2340 01/17/15 0020 01/17/15 0345 01/17/15 0752  NA 131* 132*  --  134*  132* 132*  K 7.2* 6.7* 6.8* 5.5*  6.2* 5.1  CL 103  --   --  108  103 94*  CO2 14*  --   --  18*  16* 22  BUN 72*  --   --  66*  74* 69*  CREATININE 2.29*  --   --  1.99*  2.32* 2.12*  CALCIUM 6.8*  --   --  5.8*  6.6* 6.5*  MG  --   --   --  1.9  1.7  --   PHOS  --   --   --  6.3*  6.8* 6.1*  GLUCOSE 93 113*  --  131*  139* 160*    CBG (last 3)   Recent Labs  01/17/15 0345 01/17/15 0747 01/17/15 1142  GLUCAP 131* 147* 160*    Scheduled Meds: . antiseptic oral rinse  7 mL Mouth Rinse QID  . chlorhexidine  15 mL Mouth Rinse BID  . fentaNYL  50 mcg Intravenous Once  . hydrocortisone sod succinate (SOLU-CORTEF) inj  50 mg Intravenous Q6H  . insulin aspart  0-15 Units Subcutaneous 6 times per day  . micafungin (MYCAMINE) IV  100 mg Intravenous QHS  . pantoprazole (PROTONIX) IV  40 mg Intravenous Q12H  . piperacillin-tazobactam  3.375 g Intravenous 4 times per day  . THROMBI-PAD  1 each Topical Once  . vancomycin  1,000 mg Intravenous Q24H    Continuous Infusions: . calcium gluconate infusion(930 mg elemental calcium/L) 1  mg elemental calcium/kg/hr (01/17/15 0915)  . fentaNYL infusion INTRAVENOUS 200 mcg/hr (01/17/15 1100)  . midazolam (VERSED) infusion 2 mg/hr (01/17/15 1101)  . norepinephrine (LEVOPHED) Adult infusion 28 mcg/min (01/17/15 1103)  . phenylephrine (NEO-SYNEPHRINE) Adult infusion 35 mcg/min (01/17/15 1102)  . dialysate (PRISMASATE) 1,500 mL/hr at 01/17/15 1022  . sodium bicarbonate (isotonic) 1000 mL infusion 300 mL/hr at 01/17/15 1109  . sodium bicarbonate (isotonic) 1000 mL infusion 400 mL/hr at 01/17/15 1240  . TPN (CLINIMIX) Adult without lytes    . vasopressin (PITRESSIN) infusion - *FOR SHOCK* 0.03 Units/min (01/17/15 1610)    Past Medical History  Diagnosis Date  . 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 Hepatomegaly 05/04/2011  . Fatty liver 05/04/2011    "stage III fatty liver fibrosis" (11/15/2012)  . GERD (gastroesophageal reflux disease)   . Chronic liver disease     /notes 11/15/2012  . Pericarditis     [name] 11/15/2012  . Hypertension   . Pneumonia 1977  . Shortness of breath     "all the time right now" (11/15/2012)  . History of blood transfusion 2004  . History of stomach ulcers   . Duodenal ulcer   . Depression   . Kidney stones     bilaterally/notes 11/15/2012  . Hepatic fibrosis     [name] 11/15/2012  . Anemia   . Pericardial effusion 10/29/2012    moderate to large/notes 11/15/2012  . Anxiety   . Hepatitis   . Crohn's disease   . ED (erectile dysfunction)   . 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 Anal examination under anesthesia  02/11/2011    [name]  . Treatment fistula anal  07/03/11    This was a second surgery to repair Anal fistula  . Gastrojejunostomy  2004    "had hole cut in small intestines during endoscopy; had to have OR & leave me open 3 months" (11/15/2012)  . Cholecystectomy  ~ 2003   . Appendectomy  ~ 2003  . Video assisted thoracoscopy  11/17/2012    Procedure: VIDEO ASSISTED THORACOSCOPY;  Surgeon: Loreli Slot, MD;  Location: [facility] OR;  Service: Thoracic;  Laterality: Left;  drainage of left pleural effusion  . Pericardial window  11/17/2012    Procedure: PERICARDIAL WINDOW;  Surgeon: Loreli Slot, MD;  Location[facility] OR;  Service: Thoracic;  Laterality: N/A;  . Esophagogastroduodenoscopy  01/06/2013    Procedure: ESOPHAGOGASTRODUODENOSCOPY (EGD);  Surgeon: Malissa Hippo, MD;  Location: [facility] SUITE;  Service: Endoscopy;  Laterality: N/A;  11:15  . Esophagogastroduodenoscopy N/A 03/09/2014    Dr. Karilyn Cota: erosive/ulcerative reflux esophagiits, portal gastropathy, moderate amount of bile and food debris in stomach precluding visualization of GJ anastomosis  . Gastrostomy tube placement  11/19/14    [facility]: 107 F APDL catheter. Checked again 12/23 via fluoroscopy and in suitable position  . Colonoscopy  Aug 2015    Dr. Edyth Gunnels at [facility]: congested mucosa in entire colon, solitary ulcer distal ileum, stricture in ileum 5 cm from IC valve s/p dilation. Surveillance in 1 year  . Exploratory laparotomy  Nov 05, 2014    [facility], Dr. Byrd Hesselbach. Several hour operation with unsuccessful lysis of adhesions due to frozen abdomen  . Picc line      Joaquin Courts, RD, LDN, CNSC Pager [phone] After Hours Pager [phone]

## 2015-01-17 NOTE — Progress Notes (Signed)
**Note De-identified  Obfuscation** Sputum collected and sent to lab 

## 2015-01-17 NOTE — Progress Notes (Signed)
PULMONARY / CRITICAL CARE MEDICINE   Name: David [name] MRN: [id number] DOB: [birth year]    ADMISSION DATE:  01/15/2015 CONSULTATION DATE:  01/17/2015  REFERRING MD :  [facility]  CHIEF COMPLAINT:  Dyspnea  INITIAL PRESENTATION:  39 y.o. M who presented to [facility] ED on 2/2 with 2 days of dyspnea, cough, fevers.  He was found to be hypoxic requiring NRB and CXR suggestive of HCAP/ARDS/ fungemia Maintained on TNA via RUE PICC. 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   STUDIES:  CXR 2/2 >>> b/l opacities CXR 2/3 >>> marked progression of b/l infiltrates, probable new effusions CXR 2/3 >>> progression of b/l opacities worrisome for ARDS.   SIGNIFICANT EVENTS: 2/2 - admitted to [facility] 2/3 - intubated, transferred to [facility] ICU.  Started on ARDS protocol  SUBJECTIVE: in profound shock on levo & neo gtt Minimal UO on CRRT Sedated on propofol gtt, RASS -5  VITAL SIGNS: Temp:  [96.4 F (35.8 C)-99.5 F (37.5 C)] 97.5 F (36.4 C) (02/04 1515) Pulse Rate:  [57-117] 72 (02/04 1730) Resp:  [23-35] 24 (02/04 1730) BP: (76-119)/(24-79) 99/51 mmHg (02/04 1730) SpO2:  [8 %-100 %] 94 % (02/04 1730) Arterial Line BP: (83-139)/(36-67) 115/55 mmHg (02/04 1730) FiO2 (%):  [50 %-60 %] 50 % (02/04 1700) Weight:  [89 kg (196 lb 3.4 oz)] 89 kg (196 lb 3.4 oz) (02/04 0500) HEMODYNAMICS: CVP:  [6 mmHg-17 mmHg] 6 mmHg VENTILATOR SETTINGS: Vent Mode:  [-] PRVC FiO2 (%):  [50 %-60 %] 50 % Set Rate:  [24 bmp-35 bmp] 24 bmp Vt Set:  [520 mL] 520 mL PEEP:  [8 cmH20-10 cmH20] 8 cmH20 Plateau Pressure:  [25 cmH20-28 cmH20] 25 cmH20 INTAKE / OUTPUT: Intake/Output      02/03 0701 - 02/04 0700 02/04 0701 - 02/05 0700   P.O.     I.V. (mL/kg) 1723.6 (19.4) 1800.7 (20.2)   Other  10    NG/GT  20   IV Piggyback 550 250   Total Intake(mL/kg) 2273.6 (25.5) 2080.7 (23.4)   Urine (mL/kg/hr) 335 (0.2) 298 (0.3)   Emesis/NG output 285 (0.1)    Drains 450 (0.2) 400 (0.4)   Other 577 (0.3) 1745 (1.8)   Stool 0 (0)    Total Output 1647 2443   Net +626.6 -362.3        Urine Occurrence 1 x    Stool Occurrence 1 x      PHYSICAL EXAMINATION: General: Young, chronically ill appearing male, in NAD. Neuro: Sedated on vent. HEENT: [place]/AT. PERRL, sclerae anicteric.  ETT in place. Cardiovascular: Tachy, regular, no M/R/G.  Lungs: Respirations even and unlabored.  Coarse BS bilaterally. Abdomen: Old abdominal incisions noted.  PEG in place.  BS hypoactive, soft, NT/ND.  Musculoskeletal: No gross deformities, 1+ pitting edema / anasarca.  LUE PICC. Skin: Intact, warm, no rashes.  LABS:  CBC  Recent Labs Lab 01/15/15 1440 01/16/15 0442 01/16/15 1904 01/16/15 2340  WBC 10.4 9.1 31.1*  --   HGB 9.0* 9.5* 10.3* 10.5*  HCT 29.2* 30.9* 33.2* 31.0*  PLT 76* 57* 120*  --    Coag's  Recent Labs Lab 01/17/15 0345  INR 1.78*   BMET  Recent Labs Lab 01/17/15 0345 01/17/15 0752 01/17/15 1545  NA  134*  132* 132* 133*  K 5.5*  6.2* 5.1 4.3  CL 108  103 94* 88*  CO2 18*  16* 22 34*  BUN 66*  74* 69* 60*  CREATININE 1.99*  2.32* 2.12* 1.80*  GLUCOSE 131*  139* 160* 165*   Electrolytes  Recent Labs Lab 01/17/15 0345 01/17/15 0752 01/17/15 1545  CALCIUM 5.8*  6.6* 6.5* 7.0*  MG 1.9  1.7  --   --   PHOS 6.3*  6.8* 6.1* 5.4*   Sepsis Markers  Recent Labs Lab 01/15/15 1521 01/16/15 1904  LATICACIDVEN 2.72* 4.7*  PROCALCITON  --  30.89   ABG  Recent Labs Lab 01/16/15 2047 01/17/15 0723 01/17/15 1635  PHART 7.174* 7.377 7.526*  PCO2ART 41.9 40.7 44.0  PO2ART 100.0 89.0 72.0*   Liver Enzymes  Recent Labs Lab 01/16/15 1904 01/17/15 0020 01/17/15 0345 01/17/15 0752 01/17/15 1545  AST 257* 280* 287*  --   --   ALT 108* 113* 115*  --    --   ALKPHOS 198* 140* 125*  --   --   BILITOT 4.9* 5.0* 4.8*  --   --   ALBUMIN 2.5* 2.4* 2.0*  2.2* 2.4* 2.3*   Cardiac Enzymes  Recent Labs Lab 01/16/15 1904  TROPONINI 0.07*   Glucose  Recent Labs Lab 01/16/15 1956 01/16/15 2351 01/17/15 0345 01/17/15 0747 01/17/15 1142 01/17/15 1523  GLUCAP 92 118* 131* 147* 160* 157*    Imaging Ct Abdomen Pelvis Wo Contrast  01/17/2015   CLINICAL DATA:  Septic shock. History of Crohn's disease. Worsening acidosis and leukocytosis. Renal failure.  EXAM: CT ABDOMEN AND PELVIS WITHOUT CONTRAST  TECHNIQUE: Multidetector CT imaging of the abdomen and pelvis was performed following the standard protocol without IV contrast.  COMPARISON:  Most recent CT 12/30/2014  FINDINGS: Bilateral airspace disease in the lower lobes, most significant in the left lower lobe with combination of ground-glass and confluent opacities.  Gastrostomy tube in the stomach. Tip of the enteric tube in the stomach. There is abnormal appearance of small bowel in the lower abdomen. There is diffuse small bowel thickening, mesenteric edema and mesenteric free fluid. These decreased bowel distention compared to prior. There is questionable pneumatosis involving small bowel loops in the central abdomen, axial image number 55/114. There is diffuse colonic wall thickening involving the splenic flexure through the sigmoid colon that has likely progressed from prior exam. There is diffuse colonic wall thickening of the ascending colon that is also progressed. No definite perforation or free air.  The spleen is enlarged measuring 20 cm in greatest dimension with a volume of 2025 mL. There is enlargement of the left hepatic lobe. Periportal edema is again seen. Clips in the gallbladder fossa from cholecystectomy. Pancreas is grossly unremarkable. Adrenal glands are normal. Cystic lesion adjacent of the superior right kidney abutting the right lower liver, unchanged. There is decreased  hydronephrosis from prior.  Mesenteric edema and free fluid, with ascites in the pelvis. The bladder is decompressed by Foley catheter. There is diffuse whole body wall edema consistent with third spacing.  No acute osseous abnormality.  IMPRESSION: 1. Diffuse abnormal appearance of the bowel with multifocal bowel wall thick in. There is probable pneumatosis involving small bowel in the lower abdomen. No definite perforation. There is increased mesenteric edema and free fluid from prior exam. 2. Decreased hydronephrosis. 3. Splenomegaly. 4. Worsening airspace disease in the lower lungs, left greater than right.  These results of pneumatosis were called by telephone at  the time of interpretation on 01/17/2015 at 3:38 am to Dr. Craige Cotta, who verbally acknowledged these results.   Electronically Signed   By: Rubye Oaks M.D.   On: 01/17/2015 03:39   Dg Chest Port 1 View  01/17/2015   CLINICAL DATA:  Increased density at diffuse airspace disease, likely from  EXAM: PORTABLE CHEST - 1 VIEW  COMPARISON:  Chest x-ray from earlier today  FINDINGS: Stable positioning of endotracheal and orogastric tubes. There is a new left IJ central line, tip at the upper SVC. No complicating pneumothorax. The right upper extremity PICC is in stable position, tip at the upper cavoatrial junction. Dialysis or pheresis catheter via right IJ approach is in stable position.  Lower lung volumes, which likely accounts for the diffuse increased density of bilateral airspace disease, asymmetric to the left. No increasing pleural fluid.  Stable, normal heart size.  IMPRESSION: 1. New left IJ catheter is in good position.  No pneumothorax. 2. Diffuse pneumonia and/or noncardiogenic edema. Interval increase in density is likely from lower lung volumes.   Electronically Signed   By: Tiburcio Pea M.D.   On: 01/17/2015 11:40   Dg Chest Port 1 View  01/17/2015   CLINICAL DATA:  History of aspiration pneumonia, septic shock. , hepatic cirrhosis,  respiratory failure.  EXAM: PORTABLE CHEST - 1 VIEW  COMPARISON:  Portable chest x-ray of January 17, 2015 at 1:24 a.m.  FINDINGS: There is persistent confluent bilateral airspace disease not greatly changed from the earlier study. There is relative sparing of the lung bases. The cardiopericardial silhouette is top-normal in size. The pulmonary vascularity is prominent centrally but stable. The endotracheal tube tip lies 4.3 cm above the crotch of the carina. The esophagogastric tube tip projects below the inferior margin of the image. The large caliber right internal jugular catheter tip projects over the proximal portion of the SVC. The right-sided PICC line tip projects over the midportion of the SVC.  IMPRESSION: Stable appearance of the chest with confluent bilateral alveolar opacities consistent with pneumonia, edema, or other alveolar filling process. The support tubes and lines are in appropriate position radiographically.   Electronically Signed   By: David  [place]   On: 01/17/2015 07:58   Dg Chest Port 1 View  01/17/2015   CLINICAL DATA:  Hemodialysis catheter placement.  EXAM: PORTABLE CHEST - 1 VIEW  COMPARISON:  One day prior at 1854 hr  FINDINGS: [facility] view at 0124 hr: Right dialysis catheter in the mid -distal SVC. There is no pneumothorax. Right upper extremity PICC tip remains in the distal SVC. Endotracheal tube of the thoracic inlet. Enteric tube in place, tip and side port below the diaphragm. The heart size is normal. Bilateral airspace disease, left greater than right, slightly worsened from prior. Question blunting of left costophrenic angle.  IMPRESSION: 1. Tip of the dialysis catheter in the mid SVC.  No pneumothorax. 2. Worsening bilateral airspace disease, pneumonia versus edema.   Electronically Signed   By: Rubye Oaks M.D.   On: 01/17/2015 02:48   Dg Chest Port 1 View  01/16/2015   CLINICAL DATA:  Encounter for intubation  EXAM: PORTABLE CHEST - 1 VIEW  COMPARISON:  01/16/2015   FINDINGS: Endotracheal tube tip is at the clavicular heads. A gastric suction tube enters the stomach. Stable right upper extremity PICC, tip at the upper cavoatrial junction.  Heart size and aortic contours remain normal. Bilateral airspace disease, asymmetric to the left, is unchanged. No increasing pleural fluid or  air leak.  IMPRESSION: 1. Stable positioning of tubes and central line. 2. Unchanged bilateral pneumonia and/or noncardiogenic edema.   Electronically Signed   By: Tiburcio Pea M.D.   On: 01/16/2015 19:14   Dg Chest Port 1 View  01/16/2015   CLINICAL DATA:  Respiratory failure  EXAM: PORTABLE CHEST - 1 VIEW  COMPARISON:  January 16, 2015 study obtained earlier in the day  FINDINGS: Endotracheal tube tip is 12.3 cm above the carina. Nasogastric tube tip and side port are in the stomach. Central catheter tip is in the superior vena cava just superior to the cavoatrial junction. No pneumothorax. There is widespread interstitial and patchy alveolar edema bilaterally, essentially stable. Heart is mildly enlarged with pulmonary venous hypertension. No adenopathy.  IMPRESSION: Tube and catheter positions as described without pneumothorax. Note that the endotracheal tube tip is 12.3 cm above the carina. The appearance is consistent with congestive heart failure, likely with superimposed ARDS. The appearance of the lungs and cardiac silhouette is essentially stable compared to earlier in the day.  Critical Value/emergent results were called by telephone at the time of interpretation on 01/16/2015 at 5:30 pm to Armida Sans, RN, who verbally acknowledged these results.   Electronically Signed   By: Bretta Bang M.D.   On: 01/16/2015 17:30   Portable Chest Xray  01/16/2015   CLINICAL DATA:  Endotracheal tube placement.  EXAM: PORTABLE CHEST - 1 VIEW  COMPARISON:  Earlier the same date and 01/15/2015.  FINDINGS: 1222 hr. Endotracheal tube tip is just below the thoracic inlet, approximately 8 cm above  the carina. Nasogastric tube projects below the diaphragm, tip not visualized. Right arm PICC appears unchanged near the SVC right atrial junction. There has been further worsening of the left greater than right bilateral airspace opacities with associated air bronchograms. There is no significant pleural effusion. The heart size and mediastinal contours are stable.  IMPRESSION: Endotracheal tube tip is just below the thoracic inlet. This could be advanced approximately 4 cm for more optimal positioning. Progression of bilateral airspace opacities worrisome for ARDS.   Electronically Signed   By: Roxy Horseman M.D.   On: 01/16/2015 12:35   Dg Chest Port 1 View  01/16/2015   CLINICAL DATA:  Pneumonia.  EXAM: PORTABLE CHEST - 1 VIEW  COMPARISON:  01/15/2015 and 12/26/2014  FINDINGS: There has been marked progression of bilateral pulmonary infiltrates particularly in the left upper lobe. PICC appears in good position. Heart size and vascularity are normal. I suspect there are small bilateral pleural effusions.  IMPRESSION: Marked progression of extensive bilateral pulmonary infiltrates. Probable new effusions.   Electronically Signed   By: Geanie Cooley M.D.   On: 01/16/2015 11:16   Dg Chest Port 1v Same Day  01/16/2015   CLINICAL DATA:  Endotracheal tube repositioning.  Initial encounter.  EXAM: PORTABLE CHEST - 1 VIEW SAME DAY  COMPARISON:  Earlier the same day.  FINDINGS: 1258 hr. The endotracheal tube partially overlaps the nasogastric tube and is not optimally visualized, although appears to extend to approximately 3.1 cm above the carina. The right arm PICC and nasogastric tube are unchanged. Left greater than right airspace opacities have not significantly progressed. There is no evidence of pneumothorax.  IMPRESSION: Apparent repositioning of the endotracheal tube in the mid trachea (not well seen). No significant change in bilateral airspace opacities.   Electronically Signed   By: Roxy Horseman M.D.   On:  01/16/2015 13:38   Dg Abd Portable 1v  01/16/2015   CLINICAL DATA:  40 year old male with 1 day history of abdominal pain and distension  EXAM: PORTABLE ABDOMEN - 1 VIEW  COMPARISON:  Prior CT abdomen/pelvis 12/30/2014 ; prior acute abdominal series 113 2016  FINDINGS: Percutaneous gastrostomy tube projects over the right upper quadrant in similar position compared to prior imaging. Additionally, a nasogastric tube appears present in the more proximal aspect of the stomach. The bowel gas pattern is not obstructed on this single view. No large free air although this is a supine study. A femoral vascular catheter projects over the right hip.  IMPRESSION: 1. Nonobstructed bowel gas pattern. 2. Nasogastric tube and percutaneous gastrostomy tubes project over the stomach.   Electronically Signed   By: Malachy Moan M.D.   On: 01/16/2015 20:52    ASSESSMENT / PLAN:  PULMONARY OETT 2/3 >>> A: Acute hypoxic respiratory failure HCAP Concern for ARDS P:    ARDS protocol at 6cc/kg and plat < 30 cm H2O. Lower RR as pH improves, lower PEEP as pO2 improves VAP bundle. SBT when able.   CARDIOVASCULAR RUE PICC A:  Septic Shock P:  Goal MAP > 65. Goal CVP 8 - 12. Levophed gtt, add vaso & taper neo  Stress steroids. Hold outpatient metolazone, spironolactone.  RENAL A:   AKI - resolving Pseudohypocalcemia   P:   NS @ 50. dc bicarb gtt BMP in AM.  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 Hepatic encephalopathy Chronic PEG tube GI prophylaxis Nutrition P:   Being followed at [facility] for possible small bowel transplant. Routine PEG tube care / maintenance. SUP: Pantoprazole. NPO. TPN per pharmacy. Hold outpatient  prednisone, bentyl, probiotics, phernergan, sucralfate, miralax.  HEMATOLOGIC A:   Anemia of chronic disease Thrombocytopenia - appears chronic VTE Prophylaxis P:  Transfuse per usual ICU guidelines. Monitor platelets. SCD's only. CBC in AM. Hold outpatient ferrous gluconate.  INFECTIOUS A:   Septic Shock - suspect due to HCAP; however, consider GI process given prior GI hx P:   BCx2 2/2 >>>yeast >> UCx 2/2 >>> Sputum Cx 2/2 >>> Abx: Vanc, start date 2/2 Abx: Zosyn, start date 2/2, micafungin 2/3 >> ICU procalcitonin algorithm to limit abx exposure.   ENDOCRINE A:   At risk relative AI given that chronic steroids At risk hyperglycemia P:   Stress steroids. ICU hyperglycemia protocol.   NEUROLOGIC A:   Acute metabolic encephalopathy Chronic Pain P:   Sedation:  Dc Propofol gtt /  Use Midazolam gtt & fent gtt (itching NOT an allergy) RASS goal: -2. Daily WUA. Hold outpatient oxycodone.   Family updated: wife 2/3  Interdisciplinary Family Meeting v Palliative Care Meeting:  Due by: 2/10.  Care during the described time interval was provided by me and/or other providers on the critical care team.  I have reviewed this patient's available data, including medical history, events of note, physical examination and test results as part of my evaluation  CC time x 70m  Safina Huard V. MD  01/17/2015, 6:05 PM

## 2015-01-17 NOTE — Progress Notes (Signed)
Recruitment maneuver preformed per ARDS protocol. Pt Sats 86-88 prior. Sats increased to 98%. Pt remained hemodynamically stable throughout. RT will continue to monitor.

## 2015-01-17 NOTE — Procedures (Signed)
Central Venous Catheter Insertion Procedure Note David [name] [id number] 12-Sep-1976  Procedure: Insertion of Central Venous Catheter Indications: Assessment of intravascular volume, Drug and/or fluid administration and Frequent blood sampling  Procedure Details Consent: Risks of procedure as well as the alternatives and risks of each were explained to the (patient/caregiver).  Consent for procedure obtained. Time Out: Verified patient identification, verified procedure, site/side was marked, verified correct patient position, special equipment/implants available, medications/allergies/relevent history reviewed, required imaging and test results available.  Performed  Maximum sterile technique was used including antiseptics, cap, gloves, gown, hand hygiene, mask and sheet. Skin prep: Chlorhexidine; local anesthetic administered A antimicrobial bonded/coated triple lumen catheter was placed in the left internal jugular vein using the Seldinger technique.  Evaluation Blood flow good Complications: No apparent complications Patient did tolerate procedure well. Chest X-ray ordered to verify placement.  CXR: pending.  Procedure performed under direct ultrasound guidance for real time vessel cannulation.      Rutherford Guys, [place] - [facility] [place] [facility] Pgr: [phone]  or [phone] 01/17/2015, 10:50 AM

## 2015-01-17 NOTE — Procedures (Signed)
Hemodialysis Insertion Procedure Note JOSEDEJESUS ASADA [id number] Apr 28, 1976  Procedure: Insertion of Hemodialysis Catheter Type: 3 port  Indications: Hemodialysis   Procedure Details Consent: Risks of procedure as well as the alternatives and risks of each were explained to the (patient/caregiver).  Consent for procedure obtained. Time Out: Verified patient identification, verified procedure, site/side was marked, verified correct patient position, special equipment/implants available, medications/allergies/relevent history reviewed, required imaging and test results available.  Performed  Maximum sterile technique was used including antiseptics, cap, gloves, gown, hand hygiene, mask and sheet. Skin prep: Chlorhexidine; local anesthetic administered A antimicrobial bonded/coated triple lumen catheter was placed in the right internal jugular vein using the Seldinger technique. Ultrasound guidance used.Yes.   Catheter placed to 15 cm. Blood aspirated via all 3 ports and then flushed x 3. Line sutured x 2 and dressing applied.  Evaluation Blood flow good Complications: No apparent complications Patient did tolerate procedure well. Chest X-ray ordered to verify placement.  CXR: normal.  Joneen Roach, AGACNP-BC [facility] Pulmonology/Critical Care Pager [phone] or [phone]

## 2015-01-17 NOTE — Consult Note (Addendum)
[facility]    Date of Admission:  01/15/2015  Date of Consult:  01/17/2015  Reason for Consult: fungemia Referring Physician: Terrilyn Saver "auto consult" and Dr. Lamonte Sakai   HPI: David [name] is an 39 y.o. male with, Katie past medical history including Crohn's disease fatty liver with fibrosis who presented to [facility] emerge department on February 2 of 2 days of dyspnea cough and fevers was found to be hypoxic and had x-ray suggestive of healthcare associated pneumonia. He was admitted chest x-ray has shown bilateral opacities which progressed markedly he required endotracheal intubation and was transferred to [facility] intensive care unit and started on the ARDS protocol. He is on multiple pressors, vancomycin and Zosyn. Admission blood cultures have grown Candida. I started him on micafungin last night. He was receiving TPN via a PICC line at home and this PICC line has been removed. He does still have a right IJ hemodialysis catheter that was inserted last night a few hours after his first dose of micafungin as well as now a left-sided IJ catheter that was also inserted later. He still has a femoral art line in place as well.  He has had a CT scan done that suggested pneumatosis but there was no clear cut area of intestinal perforation identified on scan.   Past Medical History  Diagnosis Date  . 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 Hepatomegaly 05/04/2011  . Fatty liver 05/04/2011    "stage III fatty liver fibrosis" (11/15/2012)  . GERD (gastroesophageal reflux disease)   . Chronic liver disease     /notes 11/15/2012  . Pericarditis     Archie Endo 11/15/2012  . Hypertension   . Pneumonia 1977  . Shortness of breath     "all the time right now" (11/15/2012)  . History of blood transfusion 2004  . History of stomach ulcers   . Duodenal ulcer   . Depression   . Kidney  stones     bilaterally/notes 11/15/2012  . Hepatic fibrosis     Ar[facility] Endo 11/15/2012  . Anemia   . Pericardial effusion 10/29/2012    moderate to large/notes 11/15/2012  . Anxiety   . Hepatitis   . Crohn's disease   . ED (erectile dysfunction)   . 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 Anal examination under anesthesia  02/11/2011    [name]  . Treatment fistula anal  07/03/11    This was a second surgery to repair Anal fistula  . Gastrojejunostomy  2004    "had hole cut in small intestines during endoscopy; had to have OR & leave me open 3 months" (11/15/2012)  . Cholecystectomy  ~ 2003  . Appendectomy  ~ 2003  . Video assisted thoracoscopy  11/17/2012    Procedure: VIDEO ASSISTED THORACOSCOPY;  Surgeon: Melrose Nakayama, MD;  Location: [facility];  Service: Thoracic;  Laterality: Left;  drainage of left pleural effusion  . Pericardial window  11/17/2012    Procedure: PERICARDIAL WINDOW;  Surgeon: Melrose Nakayama, MD;  Location: [facility];  Service: Thoracic;  Laterality: N/A;  . Esophagogastroduodenoscopy  01/06/2013    Procedure: ESOPHAGOGASTRODUODENOSCOPY (EGD);  Surgeon: Rogene Houston, MD;  [facility];  Service: Endoscopy;  Laterality: N/A;  11:15  . Esophagogastroduodenoscopy N/A 03/09/2014  Dr. Laural Golden: erosive/ulcerative reflux esophagiits, portal gastropathy, moderate amount of bile and food debris in stomach precluding visualization of GJ anastomosis  . Gastrostomy tube placement  11/19/14    [facility]: 6 F APDL catheter. Checked again 12/23 via fluoroscopy and in suitable position  . Colonoscopy  Aug 2015    [facility]. Lysle Rubens at [facility]: congested mucosa in entire colon, solitary ulcer distal ileum, stricture in ileum 5 cm from IC valve s/p dilation. Surveillance in 1 year  . Exploratory laparotomy  Nov 05, 2014    [facility], Dr. Morton Stall. Several hour operation with unsuccessful lysis of adhesions due  to frozen abdomen  . Picc line    ergies:   Allergies  Allergen Reactions  . Remicade [Infliximab]     Dr Laural Golden states previous respiratory arrest not related to remicade. Dr Laural Golden states remicade not a drug related allergy. 07-07-2013 at 1025 rapid response called to PACU- Patient difficulty breathing after infusion of Remicade infusing. Do NOT Give Remicade!  . Fentanyl And Related Itching    Swelling, Redness  . Alfentanil Itching    Swelling, Redness  . Wellbutrin [Bupropion] Nausea And Vomiting  . Morphine And Related Hives     Medications: I have reviewed patients current medications as documented in Epic Anti-infectives    Start     Dose/Rate Route Frequency Ordered Stop   01/17/15 1800  vancomycin (VANCOCIN) IVPB 1000 mg/200 mL premix     1,000 mg200 mL/hr over 60 Minutes Intravenous Every 24 hours 01/17/15 0140     01/17/15 0600  piperacillin-tazobactam (ZOSYN) IVPB 3.375 g     3.375 g100 mL/hr over 30 Minutes Intravenous 4 times per day 01/17/15 0140     01/16/15 2300  micafungin (MYCAMINE) 100 mg in sodium chloride 0.9 % 100 mL IVPB     100 mg100 mL/hr over 1 Hours Intravenous Daily at bedtime 01/16/15 2140     01/16/15 2200  piperacillin-tazobactam (ZOSYN) IVPB 3.375 g  Status:  Discontinued     3.375 g12.5 mL/hr over 240 Minutes Intravenous 3 times per day 01/16/15 1733 01/17/15 0133   01/16/15 1800  vancomycin (VANCOCIN) IVPB 1000 mg/200 mL premix  Status:  Discontinued     1,000 mg200 mL/hr over 60 Minutes Intravenous Every 8 hours 01/16/15 1733 01/17/15 0134   01/16/15 0400  vancomycin (VANCOCIN) IVPB 1000 mg/200 mL premix  Status:  Discontinued     1,000 mg200 mL/hr over 60 Minutes Intravenous Every 12 hours 01/15/15 1836 01/16/15 1710   01/15/15 2200  piperacillin-tazobactam (ZOSYN) IVPB 3.375 g  Status:  Discontinued     3.375 g12.5 mL/hr over 240 Minutes Intravenous 3 times per day 01/15/15 1853 01/16/15 1710   01/15/15 1600  vancomycin (VANCOCIN) IVPB 1000  mg/200 mL premix     1,000 mg200 mL/hr over 60 Minutes Intravenous  Once 01/15/15 1557 01/15/15 1754   01/15/15 1500  ceFEPIme (MAXIPIME) 2 g in dextrose 5 % 50 mL IVPB     2 g100 mL/hr over 30 Minutes Intravenous  Once 01/15/15 1458 01/15/15 1540   01/15/15 1500  clindamycin (CLEOCIN) IVPB 600 mg     600 mg100 mL/hr over 30 Minutes Intravenous  Once 01/15/15 1458 01/15/15 1537      Social History:  reports that he quit smoking about 2 years ago. His smoking use included Cigarettes. He started smoking about 2 years ago. He has a 12 pack-year smoking history. His smokeless tobacco use includes Snuff. He reports that he does not drink alcohol  or use illicit drugs.  Family History  Problem Relation Age of Onset  . Diabetes Mother   . Diabetes Brother   . Healthy Daughter   . Healthy Son   . Colon cancer Neg Hx     As in HPI and primary teams notes otherwise 12 point review of systems is negative  Blood pressure 114/62, pulse 69, temperature 96.4 F (35.8 C), temperature source Axillary, resp. rate 30, height $RemoveBe'5\' 11"'vqNORrkgc$  (1.803 m), weight 196 lb 3.4 oz (89 kg), SpO2 97 %. General: Sedated and intubated HEENT: Normocephalic, bandage over IJ site on left, Right HD catheter in place and in use on right. CVS regular rate, normal r,  no murmur rubs or gallops Chest:rhonchi Abdomen: soft, bandage in place.  Extremities: 2+ edema Skin: no rashes Neuro: nonfocal    Results for orders placed or performed during the hospital encounter of 01/15/15 (from the past 48 hour(s))  Blood culture (routine x 2)     Status: None (Preliminary result)   Collection Time: 01/15/15  3:07 PM  Result Value Ref Range   Specimen Description BLOOD LEFT ANTECUBITAL    Special Requests BOTTLES DRAWN AEROBIC AND ANAEROBIC 6CC    Culture      YEAST Note: Gram Stain Report Called to,Read Back By and Verified With: Vickii Penna 1803 01/16/15 BY Elza Rafter Performed at [facility] Performed at [facility]    Report Status PENDING   Blood culture (routine x 2)     Status: None (Preliminary result)   Collection Time: 01/15/15  3:12 PM  Result Value Ref Range   Specimen Description BLOOD LEFT ARM    Special Requests BOTTLES DRAWN AEROBIC AND ANAEROBIC 8CC    Culture      YEAST Note: Gram Stain Report Called to,Read Back By and Verified With: Vickii Penna 1803 01/16/15 BY Elza Rafter Performed at [facility] Performed at [facility]    Report Status PENDING   I-Stat CG4 Lactic Acid, ED     Status: Abnormal   Collection Time: 01/15/15  3:21 PM  Result Value Ref Range   Lactic Acid, Venous 2.72 (HH) 0.5 - 2.0 mmol/L  Blood gas, arterial     Status: Abnormal   Collection Time: 01/15/15  6:32 PM  Result Value Ref Range   FIO2 1.00 %   Delivery systems NON-REBREATHER OXYGEN MASK    pH, Arterial 7.304 (L) 7.350 - 7.450   pCO2 arterial 40.0 35.0 - 45.0 mmHg   pO2, Arterial 148.0 (H) 80.0 - 100.0 mmHg   Bicarbonate 19.2 (L) 20.0 - 24.0 mEq/L   TCO2 18.4 0 - 100 mmol/L   Acid-base deficit 6.0 (H) 0.0 - 2.0 mmol/L   O2 Saturation 98.7 %   Patient temperature 37.0    Collection site LEFT RADIAL    Drawn by [id number]    Sample type ARTERIAL DRAW    Allens test (pass/fail) PASS PASS  MRSA PCR Screening     Status: None   Collection Time: 01/15/15  6:33 PM  Result Value Ref Range   MRSA by PCR NEGATIVE NEGATIVE    Comment:        The GeneXpert MRSA Assay (FDA approved for NASAL specimens only), is one component of a comprehensive MRSA colonization surveillance program. It is not intended to diagnose MRSA infection nor to guide or monitor treatment for MRSA infections.   Strep pneumoniae urinary antigen     Status: None   Collection Time: 01/15/15  7:36 PM  Result Value Ref Range   Strep Pneumo Urinary Antigen NEGATIVE NEGATIVE    Comment: PERFORMED AT [facility]        Infection due to S. pneumoniae cannot be absolutely ruled out since the antigen  present may be below the detection limit of the test. Performed at [facility]   Comprehensive metabolic panel     Status: Abnormal   Collection Time: 01/16/15  4:42 AM  Result Value Ref Range   Sodium 135 135 - 145 mmol/L   Potassium 4.7 3.5 - 5.1 mmol/L   Chloride 104 96 - 112 mmol/L   CO2 22 19 - 32 mmol/L   Glucose, Bld 85 70 - 99 mg/dL   BUN 62 (H) 6 - 23 mg/dL   Creatinine, Ser 1.24 0.50 - 1.35 mg/dL   Calcium 7.2 (L) 8.4 - 10.5 mg/dL   Total Protein 5.4 (L) 6.0 - 8.3 g/dL   Albumin 2.6 (L) 3.5 - 5.2 g/dL   AST 162 (H) 0 - 37 U/L   ALT 97 (H) 0 - 53 U/L   Alkaline Phosphatase 139 (H) 39 - 117 U/L   Total Bilirubin 3.3 (H) 0.3 - 1.2 mg/dL   GFR calc non Af Amer 72 (L) >90 mL/min   GFR calc Af Amer 84 (L) >90 mL/min    Comment: (NOTE) The eGFR has been calculated using the CKD EPI equation. This calculation has not been validated in all clinical situations. eGFR's persistently <90 mL/min signify possible Chronic Kidney Disease.    Anion gap 9 5 - 15  CBC     Status: Abnormal   Collection Time: 01/16/15  4:42 AM  Result Value Ref Range   WBC 9.1 4.0 - 10.5 K/uL   RBC 3.73 (L) 4.22 - 5.81 MIL/uL   Hemoglobin 9.5 (L) 13.0 - 17.0 g/dL   HCT 30.9 (L) 39.0 - 52.0 %   MCV 82.8 78.0 - 100.0 fL   MCH 25.5 (L) 26.0 - 34.0 pg   MCHC 30.7 30.0 - 36.0 g/dL   RDW 19.5 (H) 11.5 - 15.5 %   Platelets 57 (L) 150 - 400 K/uL    Comment: SPECIMEN CHECKED FOR CLOTS PLATELET COUNT CONFIRMED BY SMEAR   Blood gas, arterial     Status: Abnormal   Collection Time: 01/16/15 10:55 AM  Result Value Ref Range   FIO2 0.85 %   O2 Content 85.0 L/min   Delivery systems NASAL CANNULA    pH, Arterial 7.237 (L) 7.350 - 7.450   pCO2 arterial 38.5 35.0 - 45.0 mmHg   pO2, Arterial 68.2 (L) 80.0 - 100.0 mmHg   Bicarbonate 15.8 (L) 20.0 - 24.0 mEq/L   TCO2 15.2 0 - 100 mmol/L   Acid-base deficit 10.2 (H) 0.0 - 2.0 mmol/L   O2 Saturation 89.6 %   Patient temperature 37.0    Collection  site LEFT RADIAL    Drawn by [id number]    Sample type ARTERIAL    Allens test (pass/fail) PASS PASS  Draw ABG 1 hour after initiation of ventilator     Status: Abnormal   Collection Time: 01/16/15 12:39 PM  Result Value Ref Range   FIO2 1.00 %   O2 Content 100.0 L/min   Delivery systems VENTILATOR    Mode PRESSURE REGULATED VOLUME CONTROL    VT 600 mL   Rate 16 resp/min   Peep/cpap 5.0 cm H20   pH, Arterial 7.040 (LL) 7.350 - 7.450  Comment: CRITICAL RESULT CALLED TO, READ BACK BY AND VERIFIED WITH: CYNTHIA PHILLIPS,RN AT [facility] ON 01/16/2015.    pCO2 arterial 62.6 (HH) 35.0 - 45.0 mmHg    Comment: CRITICAL RESULT CALLED TO, READ BACK BY AND VERIFIED WITH: CYNTHIA PHILLIPS,RN ON 01/16/2015 BY PEVIANY LAWSON,RRT AT [id number].    pO2, Arterial 217.0 (H) 80.0 - 100.0 mmHg   Bicarbonate 16.1 (L) 20.0 - 24.0 mEq/L   TCO2 16.5 0 - 100 mmol/L   Acid-base deficit 12.8 (H) 0.0 - 2.0 mmol/L   O2 Saturation 98.3 %   Patient temperature 37.0    Collection site LEFT RADIAL    Drawn by [phone]    Sample type ARTERIAL    Allens test (pass/fail) PASS PASS  Culture, respiratory (tracheal aspirate)     Status: None (Preliminary result)   Collection Time: 01/16/15  5:46 PM  Result Value Ref Range   Specimen Description TRACHEAL ASPIRATE    Special Requests Normal    Gram Stain      FEW WBC PRESENT, PREDOMINANTLY PMN NO SQUAMOUS EPITHELIAL CELLS SEEN NO ORGANISMS SEEN Performed at [facility]    Culture PENDING    Report Status PENDING   I-STAT 3, arterial blood gas (G3+)     Status: Abnormal   Collection Time: 01/16/15  6:11 PM  Result Value Ref Range   pH, Arterial 7.144 (LL) 7.350 - 7.450   pCO2 arterial 48.1 (H) 35.0 - 45.0 mmHg   pO2, Arterial 109.0 (H) 80.0 - 100.0 mmHg   Bicarbonate 16.6 (L) 20.0 - 24.0 mEq/L   TCO2 18 0 - 100 mmol/L   O2 Saturation 96.0 %   Acid-base deficit 12.0 (H) 0.0 - 2.0 mmol/L   Patient temperature 98.1 F    Collection site  RADIAL, ALLEN'S TEST ACCEPTABLE    Drawn by Operator    Sample type ARTERIAL    Comment NOTIFIED PHYSICIAN   Comprehensive metabolic panel     Status: Abnormal   Collection Time: 01/16/15  7:04 PM  Result Value Ref Range   Sodium 133 (L) 135 - 145 mmol/L   Potassium 7.0 (HH) 3.5 - 5.1 mmol/L    Comment: NO VISIBLE HEMOLYSIS REPEATED TO VERIFY CRITICAL RESULT CALLED TO, READ BACK BY AND VERIFIED WITH: J SUTTER,RN 2004 01/16/15 WBOND    Chloride 105 96 - 112 mmol/L   CO2 16 (L) 19 - 32 mmol/L   Glucose, Bld 79 70 - 99 mg/dL   BUN 68 (H) 6 - 23 mg/dL   Creatinine, Ser 2.10 (H) 0.50 - 1.35 mg/dL   Calcium 7.0 (L) 8.4 - 10.5 mg/dL   Total Protein 5.6 (L) 6.0 - 8.3 g/dL   Albumin 2.5 (L) 3.5 - 5.2 g/dL   AST 257 (H) 0 - 37 U/L   ALT 108 (H) 0 - 53 U/L   Alkaline Phosphatase 198 (H) 39 - 117 U/L   Total Bilirubin 4.9 (H) 0.3 - 1.2 mg/dL   GFR calc non Af Amer 38 (L) >90 mL/min   GFR calc Af Amer 44 (L) >90 mL/min    Comment: (NOTE) The eGFR has been calculated using the CKD EPI equation. This calculation has not been validated in all clinical situations. eGFR's persistently <90 mL/min signify possible Chronic Kidney Disease.    Anion gap 12 5 - 15  Procalcitonin     Status: None   Collection Time: 01/16/15  7:04 PM  Result Value Ref Range   Procalcitonin 30.89 ng/mL  Comment:        Interpretation: PCT >= 10 ng/mL: Important systemic inflammatory response, almost exclusively due to severe bacterial sepsis or septic shock. (NOTE)         ICU PCT Algorithm               Non ICU PCT Algorithm    ----------------------------     ------------------------------         PCT < 0.25 ng/mL                 PCT < 0.1 ng/mL     Stopping of antibiotics            Stopping of antibiotics       strongly encouraged.               strongly encouraged.    ----------------------------     ------------------------------       PCT level decrease by               PCT < 0.25 ng/mL       >=  80% from peak PCT       OR PCT 0.25 - 0.5 ng/mL          Stopping of antibiotics                                             encouraged.     Stopping of antibiotics           encouraged.    ----------------------------     ------------------------------       PCT level decrease by              PCT >= 0.25 ng/mL       < 80% from peak PCT        AND PCT >= 0.5 ng/mL             Continuing antibiotics                                              encouraged.       Continuing antibiotics            encouraged.    ----------------------------     ------------------------------     PCT level increase compared          PCT > 0.5 ng/mL         with peak PCT AND          PCT >= 0.5 ng/mL             Escalation of antibiotics                                          strongly encouraged.      Escalation of antibiotics        strongly encouraged.   CBC     Status: Abnormal   Collection Time: 01/16/15  7:04 PM  Result Value Ref Range   WBC 31.1 (H) 4.0 - 10.5 K/uL   RBC 3.97 (L) 4.22 - 5.81 MIL/uL   Hemoglobin 10.3 (L) 13.0 - 17.0 g/dL   HCT  33.2 (L) 39.0 - 52.0 %   MCV 83.6 78.0 - 100.0 fL   MCH 25.9 (L) 26.0 - 34.0 pg   MCHC 31.0 30.0 - 36.0 g/dL   RDW 20.2 (H) 11.5 - 15.5 %   Platelets 120 (L) 150 - 400 K/uL  Triglycerides     Status: Abnormal   Collection Time: 01/16/15  7:04 PM  Result Value Ref Range   Triglycerides 301 (H) <150 mg/dL  Lactic acid, plasma     Status: Abnormal   Collection Time: 01/16/15  7:04 PM  Result Value Ref Range   Lactic Acid, Venous 4.7 (HH) 0.5 - 2.0 mmol/L    Comment: CRITICAL RESULT CALLED TO, READ BACK BY AND VERIFIED WITH: J SUTTER,RN 2005 01/16/15 WBOND REPEATED TO VERIFY   Troponin I     Status: Abnormal   Collection Time: 01/16/15  7:04 PM  Result Value Ref Range   Troponin I 0.07 (H) <0.031 ng/mL    Comment:        PERSISTENTLY INCREASED TROPONIN VALUES IN THE RANGE OF 0.04-0.49 ng/mL CAN BE SEEN IN:       -UNSTABLE ANGINA        -CONGESTIVE HEART FAILURE       -MYOCARDITIS       -CHEST TRAUMA       -ARRYHTHMIAS       -LATE PRESENTING MYOCARDIAL INFARCTION       -COPD   CLINICAL FOLLOW-UP RECOMMENDED.   Glucose, capillary     Status: None   Collection Time: 01/16/15  7:56 PM  Result Value Ref Range   Glucose-Capillary 92 70 - 99 mg/dL   Comment 1 Capillary Sample    Comment 2 Documented in Chart    Comment 3 Notify RN   Lipase, blood     Status: None   Collection Time: 01/16/15  8:19 PM  Result Value Ref Range   Lipase 24 11 - 59 U/L  Amylase     Status: None   Collection Time: 01/16/15  8:19 PM  Result Value Ref Range   Amylase 52 0 - 105 U/L  Potassium     Status: Abnormal   Collection Time: 01/16/15  8:19 PM  Result Value Ref Range   Potassium 7.1 (HH) 3.5 - 5.1 mmol/L    Comment: REPEATED TO VERIFY NO VISIBLE HEMOLYSIS CRITICAL RESULT CALLED TO, READ BACK BY AND VERIFIED WITH: J SUTTER,RN 2110 01/16/15 WBOND   I-STAT 3, arterial blood gas (G3+)     Status: Abnormal   Collection Time: 01/16/15  8:47 PM  Result Value Ref Range   pH, Arterial 7.174 (LL) 7.350 - 7.450   pCO2 arterial 41.9 35.0 - 45.0 mmHg   pO2, Arterial 100.0 80.0 - 100.0 mmHg   Bicarbonate 15.5 (L) 20.0 - 24.0 mEq/L   TCO2 17 0 - 100 mmol/L   O2 Saturation 96.0 %   Acid-base deficit 12.0 (H) 0.0 - 2.0 mmol/L   Patient temperature 98.5 F    Collection site RADIAL, ALLEN'S TEST ACCEPTABLE    Drawn by VENIPUNCTURE    Sample type ARTERIAL    Comment NOTIFIED PHYSICIAN   Basic metabolic panel     Status: Abnormal   Collection Time: 01/16/15  9:25 PM  Result Value Ref Range   Sodium 131 (L) 135 - 145 mmol/L   Potassium 7.2 (HH) 3.5 - 5.1 mmol/L    Comment: NO VISIBLE HEMOLYSIS REPEATED TO VERIFY CRITICAL RESULT CALLED TO, READ BACK BY AND VERIFIED WITH:  SUTTER [facility] 01/16/15 2215 WAYK    Chloride 103 96 - 112 mmol/L   CO2 14 (L) 19 - 32 mmol/L   Glucose, Bld 93 70 - 99 mg/dL   BUN 72 (H) 6 - 23 mg/dL   Creatinine, Ser  2.29 (H) 0.50 - 1.35 mg/dL   Calcium 6.8 (L) 8.4 - 10.5 mg/dL   GFR calc non Af Amer 34 (L) >90 mL/min   GFR calc Af Amer 40 (L) >90 mL/min    Comment: (NOTE) The eGFR has been calculated using the CKD EPI equation. This calculation has not been validated in all clinical situations. eGFR's persistently <90 mL/min signify possible Chronic Kidney Disease.    Anion gap 14 5 - 15  I-STAT 4, (NA,K, GLUC, HGB,HCT)     Status: Abnormal   Collection Time: 01/16/15 11:40 PM  Result Value Ref Range   Sodium 132 (L) 135 - 145 mmol/L   Potassium 6.7 (HH) 3.5 - 5.1 mmol/L   Glucose, Bld 113 (H) 70 - 99 mg/dL   HCT 31.0 (L) 39.0 - 52.0 %   Hemoglobin 10.5 (L) 13.0 - 17.0 g/dL  Glucose, capillary     Status: Abnormal   Collection Time: 01/16/15 11:51 PM  Result Value Ref Range   Glucose-Capillary 118 (H) 70 - 99 mg/dL   Comment 1 Capillary Sample    Comment 2 Documented in Chart    Comment 3 Notify RN   Hepatic function panel     Status: Abnormal   Collection Time: 01/17/15 12:20 AM  Result Value Ref Range   Total Protein 5.0 (L) 6.0 - 8.3 g/dL   Albumin 2.4 (L) 3.5 - 5.2 g/dL   AST 280 (H) 0 - 37 U/L   ALT 113 (H) 0 - 53 U/L   Alkaline Phosphatase 140 (H) 39 - 117 U/L   Total Bilirubin 5.0 (H) 0.3 - 1.2 mg/dL   Bilirubin, Direct 3.3 (H) 0.0 - 0.5 mg/dL    Comment: Please note change in reference range.   Indirect Bilirubin 1.7 (H) 0.3 - 0.9 mg/dL  Potassium     Status: Abnormal   Collection Time: 01/17/15 12:20 AM  Result Value Ref Range   Potassium 6.8 (HH) 3.5 - 5.1 mmol/L    Comment: REPEATED TO VERIFY CRITICAL RESULT CALLED TO, READ BACK BY AND VERIFIED WITH: SUTTER J,RN 01/17/15 0107 WAYK   Protime-INR     Status: Abnormal   Collection Time: 01/17/15  3:45 AM  Result Value Ref Range   Prothrombin Time 20.8 (H) 11.6 - 15.2 seconds   INR 1.78 (H) 0.00 - 1.49  Ammonia     Status: Abnormal   Collection Time: 01/17/15  3:45 AM  Result Value Ref Range   Ammonia 92 (H) 11 - 32  umol/L  Prealbumin     Status: Abnormal   Collection Time: 01/17/15  3:45 AM  Result Value Ref Range   Prealbumin <3.0 (L) 17.0 - 34.0 mg/dL    Comment: Performed at [facility]  Magnesium     Status: None   Collection Time: 01/17/15  3:45 AM  Result Value Ref Range   Magnesium 1.9 1.5 - 2.5 mg/dL  Phosphorus     Status: Abnormal   Collection Time: 01/17/15  3:45 AM  Result Value Ref Range   Phosphorus 6.8 (H) 2.3 - 4.6 mg/dL  Triglycerides     Status: Abnormal   Collection Time: 01/17/15  3:45 AM  Result Value Ref Range   Triglycerides  268 (H) <150 mg/dL  Hepatitis panel, acute     Status: None   Collection Time: 01/17/15  3:45 AM  Result Value Ref Range   Hepatitis B Surface Ag NEGATIVE NEGATIVE   HCV Ab NEGATIVE NEGATIVE   Hep A IgM NON REACTIVE NON REACTIVE    Comment: (NOTE) Effective October 29, 2014, Hepatitis Acute Panel (test code [phone]) will be revised to automatically reflex to the Hepatitis C Viral RNA, Quantitative, Real-Time PCR assay if the Hepatitis C antibody screening result is Reactive. This action is being taken to ensure that the CDC/USPSTF recommended HCV diagnostic algorithm with the appropriate test reflex needed for accurate interpretation is followed.    Hep B C IgM NON REACTIVE NON REACTIVE    Comment: (NOTE) High levels of Hepatitis B Core IgM antibody are detectable during the acute stage of Hepatitis B. This antibody is used to differentiate current from past HBV infection. Performed at [facility]   Renal function panel (daily at 0500)     Status: Abnormal   Collection Time: 01/17/15  3:45 AM  Result Value Ref Range   Sodium 134 (L) 135 - 145 mmol/L   Potassium 5.5 (H) 3.5 - 5.1 mmol/L    Comment: DELTA CHECK NOTED REPEATED TO VERIFY    Chloride 108 96 - 112 mmol/L   CO2 18 (L) 19 - 32 mmol/L   Glucose, Bld 131 (H) 70 - 99 mg/dL   BUN 66 (H) 6 - 23 mg/dL   Creatinine, Ser 1.99 (H) 0.50 - 1.35 mg/dL   Calcium 5.8  (LL) 8.4 - 10.5 mg/dL    Comment: REPEATED TO VERIFY CRITICAL RESULT CALLED TO, READ BACK BY AND VERIFIED WITH: SUTTER J,RN 01/17/15 0443 WAYK    Phosphorus 6.3 (H) 2.3 - 4.6 mg/dL   Albumin 2.0 (L) 3.5 - 5.2 g/dL   GFR calc non Af Amer 41 (L) >90 mL/min   GFR calc Af Amer 47 (L) >90 mL/min    Comment: (NOTE) The eGFR has been calculated using the CKD EPI equation. This calculation has not been validated in all clinical situations. eGFR's persistently <90 mL/min signify possible Chronic Kidney Disease.    Anion gap 8 5 - 15  Magnesium     Status: None   Collection Time: 01/17/15  3:45 AM  Result Value Ref Range   Magnesium 1.7 1.5 - 2.5 mg/dL  Glucose, capillary     Status: Abnormal   Collection Time: 01/17/15  3:45 AM  Result Value Ref Range   Glucose-Capillary 131 (H) 70 - 99 mg/dL   Comment 1 Arterial Sample    Comment 2 Documented in Chart    Comment 3 Notify RN   Comprehensive metabolic panel     Status: Abnormal   Collection Time: 01/17/15  3:45 AM  Result Value Ref Range   Sodium 132 (L) 135 - 145 mmol/L   Potassium 6.2 (HH) 3.5 - 5.1 mmol/L    Comment: REPEATED TO VERIFY CRITICAL RESULT CALLED TO, READ BACK BY AND VERIFIED WITH: A.JARRELL,RN [id number] 01/17/15 CLARK,S NO VISIBLE HEMOLYSIS    Chloride 103 96 - 112 mmol/L   CO2 16 (L) 19 - 32 mmol/L   Glucose, Bld 139 (H) 70 - 99 mg/dL   BUN 74 (H) 6 - 23 mg/dL   Creatinine, Ser 2.32 (H) 0.50 - 1.35 mg/dL   Calcium 6.6 (L) 8.4 - 10.5 mg/dL   Total Protein 4.9 (L) 6.0 - 8.3 g/dL   Albumin 2.2 (L) 3.5 -  5.2 g/dL   AST 287 (H) 0 - 37 U/L   ALT 115 (H) 0 - 53 U/L   Alkaline Phosphatase 125 (H) 39 - 117 U/L   Total Bilirubin 4.8 (H) 0.3 - 1.2 mg/dL   GFR calc non Af Amer 34 (L) >90 mL/min   GFR calc Af Amer 39 (L) >90 mL/min    Comment: (NOTE) The eGFR has been calculated using the CKD EPI equation. This calculation has not been validated in all clinical situations. eGFR's persistently <90 mL/min signify possible  Chronic Kidney Disease.    Anion gap 13 5 - 15  TSH     Status: None   Collection Time: 01/17/15  4:25 AM  Result Value Ref Range   TSH 1.245 0.350 - 4.500 uIU/mL  I-STAT 3, arterial blood gas (G3+)     Status: None   Collection Time: 01/17/15  7:23 AM  Result Value Ref Range   pH, Arterial 7.377 7.350 - 7.450   pCO2 arterial 40.7 35.0 - 45.0 mmHg   pO2, Arterial 89.0 80.0 - 100.0 mmHg   Bicarbonate 24.0 20.0 - 24.0 mEq/L   TCO2 25 0 - 100 mmol/L   O2 Saturation 97.0 %   Acid-base deficit 1.0 0.0 - 2.0 mmol/L   Patient temperature 97.4 F    Collection site ARTERIAL LINE    Drawn by Operator    Sample type ARTERIAL   Glucose, capillary     Status: Abnormal   Collection Time: 01/17/15  7:47 AM  Result Value Ref Range   Glucose-Capillary 147 (H) 70 - 99 mg/dL  Renal function panel     Status: Abnormal   Collection Time: 01/17/15  7:52 AM  Result Value Ref Range   Sodium 132 (L) 135 - 145 mmol/L   Potassium 5.1 3.5 - 5.1 mmol/L    Comment: RESULTS VERIFIED VIA RECOLLECT   Chloride 94 (L) 96 - 112 mmol/L   CO2 22 19 - 32 mmol/L   Glucose, Bld 160 (H) 70 - 99 mg/dL   BUN 69 (H) 6 - 23 mg/dL   Creatinine, Ser 2.12 (H) 0.50 - 1.35 mg/dL   Calcium 6.5 (L) 8.4 - 10.5 mg/dL   Phosphorus 6.1 (H) 2.3 - 4.6 mg/dL   Albumin 2.4 (L) 3.5 - 5.2 g/dL   GFR calc non Af Amer 38 (L) >90 mL/min   GFR calc Af Amer 44 (L) >90 mL/min    Comment: (NOTE) The eGFR has been calculated using the CKD EPI equation. This calculation has not been validated in all clinical situations. eGFR's persistently <90 mL/min signify possible Chronic Kidney Disease.    Anion gap 16 (H) 5 - 15  Glucose, capillary     Status: Abnormal   Collection Time: 01/17/15 11:42 AM  Result Value Ref Range   Glucose-Capillary 160 (H) 70 - 99 mg/dL   '@BRIEFLABTABLE'$ (sdes,specrequest,cult,reptstatus)   ) Recent Results (from the past 720 hour(s))  Wound culture     Status: None   Collection Time: 12/21/14  6:44 AM    Result Value Ref Range Status   Specimen Description G/T SITE  Final   Special Requests Normal  Final   Gram Stain   Final    FEW WBC PRESENT,BOTH PMN AND MONONUCLEAR NO SQUAMOUS EPITHELIAL CELLS SEEN NO ORGANISMS SEEN Performed at [facility]    Culture   Final    MULTIPLE ORGANISMS PRESENT, NONE PREDOMINANT Note: NO STAPHYLOCOCCUS AUREUS ISOLATED NO GROUP A STREP (S.PYOGENES) ISOLATED Performed at  [facility]    Report Status 12/24/2014 FINAL  Final  Culture, Urine     Status: None   Collection Time: 12/21/14  7:35 AM  Result Value Ref Range Status   Specimen Description URINE, CLEAN CATCH  Final   Special Requests Normal  Final   Colony Count   Final    2,000 COLONIES/ML Performed at [facility]    Culture   Final    INSIGNIFICANT GROWTH Performed at [facility]    Report Status 12/22/2014 FINAL  Final  Culture, blood (routine x 2)     Status: None   Collection Time: 12/21/14  8:26 AM  Result Value Ref Range Status   Specimen Description BLOOD LEFT ANTECUBITAL  Final   Special Requests BOTTLES DRAWN AEROBIC AND ANAEROBIC 10CC BOTTLES  Final   Culture NO GROWTH 5 DAYS  Final   Report Status 12/26/2014 FINAL  Final  Culture, blood (routine x 2)     Status: None   Collection Time: 12/21/14  8:31 AM  Result Value Ref Range Status   Specimen Description BLOOD LEFT HAND  Final   Special Requests BOTTLES DRAWN AEROBIC ONLY 6CC BOTTLE  Final   Culture NO GROWTH 5 DAYS  Final   Report Status 12/26/2014 FINAL  Final  Clostridium Difficile by PCR     Status: None   Collection Time: 12/21/14  1:56 PM  Result Value Ref Range Status   C difficile by pcr NEGATIVE NEGATIVE Final  Wound culture     Status: None   Collection Time: 12/24/14  2:30 PM  Result Value Ref Range Status   Specimen Description WOUND GASTROSTOMY TUBE SITE  Final   Special Requests NONE  Final   Gram Stain   Final    RARE WBC PRESENT, PREDOMINANTLY MONONUCLEAR NO  SQUAMOUS EPITHELIAL CELLS SEEN NO ORGANISMS SEEN Performed at [facility]    Culture   Final    RARE PSEUDOMONAS AERUGINOSA Performed at [facility]    Report Status 12/28/2014 FINAL  Final   Organism ID, Bacteria PSEUDOMONAS AERUGINOSA  Final      Susceptibility   Pseudomonas aeruginosa - MIC*    CEFEPIME 8 SENSITIVE Sensitive     CEFTAZIDIME 4 SENSITIVE Sensitive     CIPROFLOXACIN <=0.25 SENSITIVE Sensitive     GENTAMICIN 8 INTERMEDIATE Intermediate     IMIPENEM 1 SENSITIVE Sensitive     PIP/TAZO 8 SENSITIVE Sensitive     TOBRAMYCIN <=1 SENSITIVE Sensitive     * RARE PSEUDOMONAS AERUGINOSA  Clostridium Difficile by PCR     Status: None   Collection Time: 12/26/14 10:16 PM  Result Value Ref Range Status   C difficile by pcr NEGATIVE NEGATIVE Final  Wound culture     Status: None   Collection Time: 12/31/14 12:54 AM  Result Value Ref Range Status   Specimen Description WOUND ABDOMEN  Final   Special Requests IMMUNE:COMPROMISED  Final   Gram Stain   Final    RARE WBC PRESENT, PREDOMINANTLY MONONUCLEAR NO SQUAMOUS EPITHELIAL CELLS SEEN RARE GRAM POSITIVE COCCI IN PAIRS IN CLUSTERS FEW YEAST Performed at [facility]    Culture   Final    MULTIPLE ORGANISMS PRESENT, NONE PREDOMINANT Note: NO GROUP A STREP (S.PYOGENES) ISOLATED NO STAPHYLOCOCCUS AUREUS ISOLATED Performed at [facility]    Report Status 01/03/2015 FINAL  Final  Blood culture (routine x 2)     Status: None (Preliminary result)   Collection Time:  01/15/15  3:07 PM  Result Value Ref Range Status   Specimen Description BLOOD LEFT ANTECUBITAL  Final   Special Requests BOTTLES DRAWN AEROBIC AND ANAEROBIC [facility]  Final   Culture   Final    YEAST Note: Gram Stain Report Called to,Read Back By and Verified With: Armida Sans 1803 01/16/15 BY Ginette Pitman Performed at [facility] Performed at [facility]    Report Status PENDING  Incomplete  Blood culture (routine x  2)     Status: None (Preliminary result)   Collection Time: 01/15/15  3:12 PM  Result Value Ref Range Status   Specimen Description BLOOD LEFT ARM  Final   Special Requests BOTTLES DRAWN AEROBIC AND ANAEROBIC 8CC  Final   Culture   Final    YEAST Note: Gram Stain Report Called to,Read Back By and Verified With: Armida Sans 1803 01/16/15 BY Ginette Pitman Performed at [facility] Performed at [facility]    Report Status PENDING  Incomplete  MRSA PCR Screening     Status: None   Collection Time: 01/15/15  6:33 PM  Result Value Ref Range Status   MRSA by PCR NEGATIVE NEGATIVE Final    Comment:        The GeneXpert MRSA Assay (FDA approved for NASAL specimens only), is one component of a comprehensive MRSA colonization surveillance program. It is not intended to diagnose MRSA infection nor to guide or monitor treatment for MRSA infections.   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 male with  multiple medical problems cirrhosis Crohn's disease on TPN via PICC at home admitted with apparent healthcare  associated pneumonia sepsis now found to be fungemic  #1 Fungemia: likely candidemia.  --PER [facility] 2016 GUIDELINES I started him on echinocandin namely micafungin --We are repeating blood cultures today after he been on the echinocandin  --I'm happy that his PICC line is out ultimately he will need a catheter holiday from all of his access sites but clearly now is not the time. I would remove any catheter that is not needed.  He will need 2 weeks of therapy from the date of negative blood cultures obtained after he has had all access sites removed.  Ideally would need a funduscopic eye exam done as well by ophthalmology  #2 healthcare associated pneumonia: Currently on vancomycin and Zosyn and these are reasonable  #3 screening will screen for HIV and viral hepatitides      01/17/2015, 2:41 PM   Thank you so much for this interesting consult  [facility] [phone] (pager) [phone] (office) 01/17/2015, 2:41 PM  Rhina Brackett [place] 01/17/2015, 2:41 PM

## 2015-01-17 NOTE — Progress Notes (Addendum)
PARENTERAL NUTRITION CONSULT NOTE - INITIAL  Pharmacy Consult for TPN Indication: End stage Chron's/Chronic TPN as outpatient  Allergies  Allergen Reactions  . Remicade [Infliximab]     Dr Karilyn Cota states previous respiratory arrest not related to remicade. Dr Karilyn Cota states remicade not a drug related allergy. 07-07-2013 at 1025 rapid response called to PACU- Patient difficulty breathing after infusion of Remicade infusing. Do NOT Give Remicade!  . Fentanyl And Related Itching    Swelling, Redness  . Alfentanil Itching    Swelling, Redness  . Wellbutrin [Bupropion] Nausea And Vomiting  . Morphine And Related Hives    Patient Measurements: Height: 5\' 11"  (180.3 cm) Weight: 196 lb 3.4 oz (89 kg) IBW/kg (Calculated) : 75.3 Dry weight: 80.9 kg  Vital Signs: Temp: 97.4 F (36.3 C) (02/04 0724) Temp Source: Axillary (02/04 0724) BP: 116/61 mmHg (02/04 0900) Pulse Rate: 59 (02/04 0900) Intake/Output from previous day: 02/03 0701 - 02/04 0700 In: 2273.6 [I.V.:1723.6; IV Piggyback:550] Out: 1647 [Urine:335; Emesis/NG output:285; Drains:450] Intake/Output from this shift: Total I/O In: 252.5 [I.V.:222.5; Other:10; NG/GT:20] Out: 459 [Urine:130; Other:329]  Labs:  Recent Labs  01/15/15 1440 01/16/15 0442 01/16/15 1904 01/16/15 2340 01/17/15 0345  WBC 10.4 9.1 31.1*  --   --   HGB 9.0* 9.5* 10.3* 10.5*  --   HCT 29.2* 30.9* 33.2* 31.0*  --   PLT 76* 57* 120*  --   --   INR  --   --   --   --  1.78*     Recent Labs  01/16/15 1904  01/16/15 2125 01/16/15 2340 01/17/15 0020 01/17/15 0345  NA 133*  --  131* 132*  --  134*  132*  K 7.0*  < > 7.2* 6.7* 6.8* 5.5*  6.2*  CL 105  --  103  --   --  108  103  CO2 16*  --  14*  --   --  18*  16*  GLUCOSE 79  --  93 113*  --  131*  139*  BUN 68*  --  72*  --   --  66*  74*  CREATININE 2.10*  --  2.29*  --   --  1.99*  2.32*  CALCIUM 7.0*  --  6.8*  --   --  5.8*  6.6*  MG  --   --   --   --   --  1.9  1.7  PHOS   --   --   --   --   --  6.3*  6.8*  PROT 5.6*  --   --   --  5.0* 4.9*  ALBUMIN 2.5*  --   --   --  2.4* 2.0*  2.2*  AST 257*  --   --   --  280* 287*  ALT 108*  --   --   --  113* 115*  ALKPHOS 198*  --   --   --  140* 125*  BILITOT 4.9*  --   --   --  5.0* 4.8*  BILIDIR  --   --   --   --  3.3*  --   IBILI  --   --   --   --  1.7*  --   TRIG 301*  --   --   --   --  268*  < > = values in this interval not displayed. Estimated Creatinine Clearance: 53.6 mL/min (by C-G formula based on Cr of 1.99).  Recent Labs  01/16/15 1956 01/16/15 2351 01/17/15 0345  GLUCAP 92 118* 131*    Medical History: Past Medical History  Diagnosis Date  . 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 Hepatomegaly 05/04/2011  . Fatty liver 05/04/2011    "stage III fatty liver fibrosis" (11/15/2012)  . GERD (gastroesophageal reflux disease)   . Chronic liver disease     /notes 11/15/2012  . Pericarditis     [name] 11/15/2012  . Hypertension   . Pneumonia 1977  . Shortness of breath     "all the time right now" (11/15/2012)  . History of blood transfusion 2004  . History of stomach ulcers   . Duodenal ulcer   . Depression   . Kidney stones     bilaterally/notes 11/15/2012  . Hepatic fibrosis     [name] 11/15/2012  . Anemia   . Pericardial effusion 10/29/2012    moderate to large/notes 11/15/2012  . Anxiety   . Hepatitis   . Crohn's disease   . ED (erectile dysfunction)   . Cirrhosis     secondary to drug effect, +/- fatty liver    Medications:  See EMR  Insulin Requirements in the past 24 hours:  10 units SSI  Current Nutrition:  TPN with [facility] In 24 hours pt was receiving 2150 kcal, 115 g protein + lipids TPN included electrolytes and trace elements  Assessment: 39 yo male with extensive PMH and multiple admissions. Has chronic, end-stage, Chron's disease and PUD and is being evaluated  for small bowel transplant at [facility]. 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  Was admitted for pneumonia at [facility] in Jan 2016 and was admitted to [facility] in Nov 2015 for attempted ex-lap due to SBO.  Has been receiving TPN as outpatient.  GI: Not a surgical candidate due to abundance of scar tissue. Last pre-albumin Jan 16: 13.9, currently <3. Cirrhosis secondary to MTX vs NASH. NG output 285 ml. On ppi Endo: cbgs 79-176, on stress steroids, hydrocortisone 50/6h, TSH wnl Lytes: K 7.2 > 5.5, Corrected Ca 7.4, Phos 6.3, Mg 1.9. Rec'd Ca 2g x1 last night, now is on Calcium continuous infusion. Ca x Phos = 45 Renal: Started CVVHD this morning, SCr 1.99, UOP not charted accurately, still making urine. Receiving HCO3 with CVVHD  Pulm: Vent Cards: BP soft, HR 70s, On levophed, neo-synephrine, vasopressin with MAP in 70s, CVP 13-17. TG 268 Hepatobil: LFTs elevated from Jan, AST 287, ALT 115, Tbili 4.8 Neuro: sedated on fent gtt, versed gtt, GCS 5, RASS -2 (goal -2), CPOT 0-6 ID: Current yeast bacteremia/recurrent pneumonia, sepsis. Admission in Jan 2016 for pneumonia, also grew pan-sensitive pseduomonas at that time from G-tube site. LA 4.7, WBC 31, afebrile, PCT 30.8  2/2 Blood cx: yeast  2/3 blood cx: 2/3 urine cx: 2/3 mrsa: neg 2/4 resp cx:  2/3 micafungin > 2/2 vanc > 2/2 zosyn > 2/2 cefepime x1 2/2 clinda x1  TPN Access: Midline Double Lumen -- to be replaced today  TPN day#: 1    Nutritional Goals:  2200-2300 kCal, 150 grams of protein per day, per RD discussion 01/17/15  Plan:  -Clinimix 5/15 WITHOUT electrolytes at 120 ml/hr + 20% lipids at 10 ml/hr on Mon/Wed/Fri, this will provide 144 g protein and a daily average of 2250 kcal -IV multivitamin in TPN -Trace elements Mon/Wed/Fri due to Tbili  -Monitor lytes closely, Ca minimum of q12h  while on Calcium infusion     Agapito Games, PharmD, BCPS Clinical Pharmacist Pager: [phone] 01/17/2015 9:59 AM

## 2015-01-17 NOTE — Progress Notes (Signed)
CVVHD management.  K trending down.  Receiving bicarb pre and post filter.  Ca low, will replace.  Receiving 2 pressors and trying to pull 50cc/hr.  Will re assess K and bicarb this afternoon as anticipate we can decrease bicarb gtt.

## 2015-01-17 NOTE — Progress Notes (Signed)
CT scan shows significant large bowel wall thickening, potential areas of central SB pneumatosis, no free air and minimal free fluid.  His clinical examination has not changed much and actually his abdominal distension seems much better.  Cannot subjectively eliminate peritonitis with the patient so sedated.  He does not react at all.  No urgent need for surgery at this point, but we must stay aware of his condition.  Marta Lamas. Gae Bon, MD, FACS [phone] [facility]

## 2015-01-17 NOTE — Progress Notes (Signed)
ANTIBIOTIC CONSULT NOTE - FOLLOW UP  Pharmacy Consult for Vancocin, Laqueta Jean, and Mycamine Indication: pneumonia and sepsis  Labs:  Recent Labs  01/15/15 1440 01/16/15 0442 01/16/15 1904 01/16/15 2125 01/16/15 2340  WBC 10.4 9.1 31.1*  --   --   HGB 9.0* 9.5* 10.3*  --  10.5*  PLT 76* 57* 120*  --   --   CREATININE 1.38* 1.24 2.10* 2.29*  --    Estimated Creatinine Clearance: 50.5 mL/min (by C-G formula based on Cr of 2.29).   Microbiology: Recent Results (from the past 720 hour(s))  Wound culture     Status: None   Collection Time: 12/21/14  6:44 AM  Result Value Ref Range Status   Specimen Description G/T SITE  Final   Special Requests Normal  Final   Gram Stain   Final    FEW WBC PRESENT,BOTH PMN AND MONONUCLEAR NO SQUAMOUS EPITHELIAL CELLS SEEN NO ORGANISMS SEEN Performed at [facility]    Culture   Final    MULTIPLE ORGANISMS PRESENT, NONE PREDOMINANT Note: NO STAPHYLOCOCCUS AUREUS ISOLATED NO GROUP A STREP (S.PYOGENES) ISOLATED Performed at [facility]    Report Status 12/24/2014 FINAL  Final  Culture, Urine     Status: None   Collection Time: 12/21/14  7:35 AM  Result Value Ref Range Status   Specimen Description URINE, CLEAN CATCH  Final   Special Requests Normal  Final   Colony Count   Final    2,000 COLONIES/ML Performed at [facility]    Culture   Final    INSIGNIFICANT GROWTH Performed at [facility]    Report Status 12/22/2014 FINAL  Final  Culture, blood (routine x 2)     Status: None   Collection Time: 12/21/14  8:26 AM  Result Value Ref Range Status   Specimen Description BLOOD LEFT ANTECUBITAL  Final   Special Requests BOTTLES DRAWN AEROBIC AND ANAEROBIC 10CC BOTTLES  Final   Culture NO GROWTH 5 DAYS  Final   Report Status 12/26/2014 FINAL  Final  Culture, blood (routine x 2)     Status: None   Collection Time: 12/21/14  8:31 AM  Result Value Ref Range Status   Specimen Description BLOOD LEFT HAND   Final   Special Requests BOTTLES DRAWN AEROBIC ONLY 6CC BOTTLE  Final   Culture NO GROWTH 5 DAYS  Final   Report Status 12/26/2014 FINAL  Final  Clostridium Difficile by PCR     Status: None   Collection Time: 12/21/14  1:56 PM  Result Value Ref Range Status   C difficile by pcr NEGATIVE NEGATIVE Final  Wound culture     Status: None   Collection Time: 12/24/14  2:30 PM  Result Value Ref Range Status   Specimen Description WOUND GASTROSTOMY TUBE SITE  Final   Special Requests NONE  Final   Gram Stain   Final    RARE WBC PRESENT, PREDOMINANTLY MONONUCLEAR NO SQUAMOUS EPITHELIAL CELLS SEEN NO ORGANISMS SEEN Performed at [facility]    Culture   Final    RARE PSEUDOMONAS AERUGINOSA Performed at [facility]    Report Status 12/28/2014 FINAL  Final   Organism ID, Bacteria PSEUDOMONAS AERUGINOSA  Final      Susceptibility   Pseudomonas aeruginosa - MIC*    CEFEPIME 8 SENSITIVE Sensitive     CEFTAZIDIME 4 SENSITIVE Sensitive     CIPROFLOXACIN <=0.25 SENSITIVE Sensitive     GENTAMICIN 8 INTERMEDIATE Intermediate  IMIPENEM 1 SENSITIVE Sensitive     PIP/TAZO 8 SENSITIVE Sensitive     TOBRAMYCIN <=1 SENSITIVE Sensitive     * RARE PSEUDOMONAS AERUGINOSA  Clostridium Difficile by PCR     Status: None   Collection Time: 12/26/14 10:16 PM  Result Value Ref Range Status   C difficile by pcr NEGATIVE NEGATIVE Final  Wound culture     Status: None   Collection Time: 12/31/14 12:54 AM  Result Value Ref Range Status   Specimen Description WOUND ABDOMEN  Final   Special Requests IMMUNE:COMPROMISED  Final   Gram Stain   Final    RARE WBC PRESENT, PREDOMINANTLY MONONUCLEAR NO SQUAMOUS EPITHELIAL CELLS SEEN RARE GRAM POSITIVE COCCI IN PAIRS IN CLUSTERS FEW YEAST Performed at [facility]    Culture   Final    MULTIPLE ORGANISMS PRESENT, NONE PREDOMINANT Note: NO GROUP A STREP (S.PYOGENES) ISOLATED NO STAPHYLOCOCCUS AUREUS ISOLATED Performed at [facility]    Report Status 01/03/2015 FINAL  Final  Blood culture (routine x 2)     Status: None (Preliminary result)   Collection Time: 01/15/15  3:07 PM  Result Value Ref Range Status   Specimen Description BLOOD LEFT ANTECUBITAL  Final   Special Requests BOTTLES DRAWN AEROBIC AND ANAEROBIC 6CC  Final   Culture   Final    YEAST Gram Stain Report Called to,Read Back By and Verified With: Armida Sans (MC 2SC) AT 1803 ON 01/16/2015 BY BAUGHAM,M. Performed at [facility]    Report Status PENDING  Incomplete  Blood culture (routine x 2)     Status: None (Preliminary result)   Collection Time: 01/15/15  3:12 PM  Result Value Ref Range Status   Specimen Description BLOOD LEFT ARM  Final   Special Requests BOTTLES DRAWN AEROBIC AND ANAEROBIC 8CC  Final   Culture   Final    YEAST Gram Stain Report Called to,Read Back By and Verified With: Armida Sans (MC 2SC) AT 1803 ON 01/16/2015 BY BAUGHAM,M. Performed at [facility]    Report Status PENDING  Incomplete  MRSA PCR Screening     Status: None   Collection Time: 01/15/15  6:33 PM  Result Value Ref Range Status   MRSA by PCR NEGATIVE NEGATIVE Final    Comment:        The GeneXpert MRSA Assay (FDA approved for NASAL specimens only), is one component of a comprehensive MRSA colonization surveillance program. It is not intended to diagnose MRSA infection nor to guide or monitor treatment for MRSA infections.      Assessment: 39yo male tx'd from [facility] for further tx of sepsis/HCAP, started on IV ABX but now pt in renal failure and started on CRRT.  Goal of Therapy:  Vancomycin trough level 15-20 mcg/ml  Plan:  Will change vancomycin to  IV Q24H and change Zosyn to 3.375g IV Q6H over each given CRRT; Mycamine remains appropriate at Q24H dosing.  Vernard Gambles, PharmD, BCPS  01/17/2015,1:34 AM

## 2015-01-18 ENCOUNTER — Inpatient Hospital Stay (HOSPITAL_COMMUNITY): Payer: Medicare Other

## 2015-01-18 DIAGNOSIS — B49 Unspecified mycosis: Secondary | ICD-10-CM

## 2015-01-18 DIAGNOSIS — E43 Unspecified severe protein-calorie malnutrition: Secondary | ICD-10-CM

## 2015-01-18 LAB — RENAL FUNCTION PANEL
ALBUMIN: 2.1 g/dL — AB (ref 3.5–5.2)
ANION GAP: 5 (ref 5–15)
ANION GAP: 5 (ref 5–15)
Albumin: 2 g/dL — ABNORMAL LOW (ref 3.5–5.2)
BUN: 57 mg/dL — ABNORMAL HIGH (ref 6–23)
BUN: 55 mg/dL — ABNORMAL HIGH (ref 6–23)
CALCIUM: 6.7 mg/dL — AB (ref 8.4–10.5)
CHLORIDE: 93 mmol/L — AB (ref 96–112)
CO2: 34 mmol/L — AB (ref 19–32)
CO2: 30 mmol/L (ref 19–32)
CREATININE: 1.66 mg/dL — AB (ref 0.50–1.35)
Calcium: 6.6 mg/dL — ABNORMAL LOW (ref 8.4–10.5)
Chloride: 97 mmol/L (ref 96–112)
Creatinine, Ser: 1.52 mg/dL — ABNORMAL HIGH (ref 0.50–1.35)
GFR calc Af Amer: 59 mL/min — ABNORMAL LOW (ref >90)
GFR, EST AFRICAN AMERICAN: 66 mL/min — AB (ref >90)
GFR, EST NON AFRICAN AMERICAN: 51 mL/min — AB (ref >90)
GFR, EST NON AFRICAN AMERICAN: 57 mL/min — AB (ref >90)
Glucose, Bld: 275 mg/dL — ABNORMAL HIGH (ref 70–99)
Glucose, Bld: 235 mg/dL — ABNORMAL HIGH (ref 70–99)
Phosphorus: 3.8 mg/dL (ref 2.3–4.6)
Phosphorus: 2.6 mg/dL (ref 2.3–4.6)
Potassium: 4.1 mmol/L (ref 3.5–5.1)
Potassium: 4.1 mmol/L (ref 3.5–5.1)
Sodium: 132 mmol/L — ABNORMAL LOW (ref 135–145)
Sodium: 132 mmol/L — ABNORMAL LOW (ref 135–145)

## 2015-01-18 LAB — POCT I-STAT 3, ART BLOOD GAS (G3+)
ACID-BASE EXCESS: 2 mmol/L (ref 0.0–2.0)
Acid-Base Excess: 6 mmol/L — ABNORMAL HIGH (ref 0.0–2.0)
BICARBONATE: 31.5 meq/L — AB (ref 20.0–24.0)
Bicarbonate: 27.6 mEq/L — ABNORMAL HIGH (ref 20.0–24.0)
O2 SAT: 94 %
O2 Saturation: 97 %
PCO2 ART: 44.3 mmHg (ref 35.0–45.0)
PH ART: 7.411 (ref 7.350–7.450)
PH ART: 7.402 (ref 7.350–7.450)
Patient temperature: 98.4
TCO2: 33 mmol/L (ref 0–100)
TCO2: 29 mmol/L (ref 0–100)
pCO2 arterial: 49.5 mmHg — ABNORMAL HIGH (ref 35.0–45.0)
pO2, Arterial: 89 mmHg (ref 80.0–100.0)
pO2, Arterial: 72 mmHg — ABNORMAL LOW (ref 80.0–100.0)

## 2015-01-18 LAB — BASIC METABOLIC PANEL
Anion gap: 5 (ref 5–15)
BUN: 56 mg/dL — ABNORMAL HIGH (ref 6–23)
CALCIUM: 6.7 mg/dL — AB (ref 8.4–10.5)
CO2: 28 mmol/L (ref 19–32)
Chloride: 98 mmol/L (ref 96–112)
Creatinine, Ser: 1.58 mg/dL — ABNORMAL HIGH (ref 0.50–1.35)
GFR calc Af Amer: 63 mL/min — ABNORMAL LOW (ref >90)
GFR calc non Af Amer: 54 mL/min — ABNORMAL LOW (ref >90)
Glucose, Bld: 227 mg/dL — ABNORMAL HIGH (ref 70–99)
Potassium: 4.1 mmol/L (ref 3.5–5.1)
SODIUM: 131 mmol/L — AB (ref 135–145)

## 2015-01-18 LAB — GLUCOSE, CAPILLARY
GLUCOSE-CAPILLARY: 272 mg/dL — AB (ref 70–99)
GLUCOSE-CAPILLARY: 232 mg/dL — AB (ref 70–99)
GLUCOSE-CAPILLARY: 222 mg/dL — AB (ref 70–99)
GLUCOSE-CAPILLARY: 225 mg/dL — AB (ref 70–99)
GLUCOSE-CAPILLARY: 241 mg/dL — AB (ref 70–99)

## 2015-01-18 LAB — CBC
HEMATOCRIT: 25.6 % — AB (ref 39.0–52.0)
HEMOGLOBIN: 8.4 g/dL — AB (ref 13.0–17.0)
MCH: 26.3 pg (ref 26.0–34.0)
MCHC: 32.8 g/dL (ref 30.0–36.0)
MCV: 80 fL (ref 78.0–100.0)
PLATELETS: 33 10*3/uL — AB (ref 150–400)
RBC: 3.2 MIL/uL — AB (ref 4.22–5.81)
RDW: 19.6 % — ABNORMAL HIGH (ref 11.5–15.5)
WBC: 10.5 10*3/uL (ref 4.0–10.5)

## 2015-01-18 LAB — CG4 I-STAT (LACTIC ACID): LACTIC ACID, VENOUS: 1.91 mmol/L (ref 0.5–2.0)

## 2015-01-18 LAB — HIV ANTIBODY (ROUTINE TESTING W REFLEX): HIV Screen 4th Generation wRfx: NONREACTIVE

## 2015-01-18 LAB — PHOSPHORUS: PHOSPHORUS: 2.6 mg/dL (ref 2.3–4.6)

## 2015-01-18 LAB — MAGNESIUM
MAGNESIUM: 2.2 mg/dL (ref 1.5–2.5)
Magnesium: 2.2 mg/dL (ref 1.5–2.5)

## 2015-01-18 MED ORDER — SODIUM CHLORIDE 0.9 % IV SOLN
0.0000 mg/h | INTRAVENOUS | Status: AC
  Administered 2015-01-18: 4 mg/h via INTRAVENOUS
  Administered 2015-01-19: 1 mg/h via INTRAVENOUS
  Administered 2015-01-20: 3 mg/h via INTRAVENOUS
  Filled 2015-01-18 (×4): qty 10

## 2015-01-18 MED ORDER — TRACE MINERALS CR-CU-F-FE-I-MN-MO-SE-ZN IV SOLN
INTRAVENOUS | Status: AC
  Administered 2015-01-18: 17:00:00 via INTRAVENOUS
  Filled 2015-01-18: qty 2880

## 2015-01-18 MED ORDER — VECURONIUM BROMIDE 10 MG IV SOLR
6.0000 mg | Freq: Once | INTRAVENOUS | Status: AC
  Administered 2015-01-18: 6 mg via INTRAVENOUS

## 2015-01-18 MED ORDER — MIDAZOLAM BOLUS VIA INFUSION
1.0000 mg | INTRAVENOUS | Status: DC | PRN
  Filled 2015-01-18: qty 2

## 2015-01-18 MED ORDER — MIDAZOLAM HCL 2 MG/2ML IJ SOLN
INTRAMUSCULAR | Status: AC
  Administered 2015-01-18: 2 mg
  Filled 2015-01-18: qty 2

## 2015-01-18 MED ORDER — MIDAZOLAM HCL 2 MG/2ML IJ SOLN: 4.0000 mg | Freq: Once | INTRAMUSCULAR | Status: AC

## 2015-01-18 MED ORDER — VECURONIUM BROMIDE 10 MG IV SOLR
INTRAVENOUS | Status: AC
  Filled 2015-01-18: qty 10

## 2015-01-18 MED ORDER — MIDAZOLAM HCL 2 MG/2ML IJ SOLN
INTRAMUSCULAR | Status: AC
  Filled 2015-01-18: qty 2

## 2015-01-18 MED ORDER — FAT EMULSION 20 % IV EMUL
240.0000 mL | INTRAVENOUS | Status: AC
  Administered 2015-01-18: 240 mL via INTRAVENOUS
  Filled 2015-01-18: qty 250

## 2015-01-18 NOTE — Progress Notes (Signed)
CVVHD Management.  Still on pressors though lower dosages.  Acidosis better and off bicarb gtt. K better.  PO4 and Mag OK.  Will increase pre filter fluid to help prevent filter clotting.  Making some urine but not much.  Corrected calcium low NL.  Pulling 25-50cc/hr as tolerated.

## 2015-01-18 NOTE — Progress Notes (Signed)
Peg tube bleeding at insertion site pressure applied X20 minutes some bleeding noted from peg drainage tube afterwards,Dr Vassie Loll notified and at bedside.Dr Derrell Lolling and Aris Georgia PA paged

## 2015-01-18 NOTE — Progress Notes (Signed)
[facility] Progress Note     Subjective: Pt intubated, sedated, and unresponsive.  Not able to open eyes or follow commands.  On CRRT, pressors.  Making minimal urine.  Objective: Vital signs in last 24 hours: Temp:  [96.4 F (35.8 C)-98.9 F (37.2 C)] 98.9 F (37.2 C) (02/05 0714) Pulse Rate:  [57-103] 71 (02/05 0800) Resp:  [18-35] 25 (02/05 0800) BP: (88-143)/(46-79) 96/55 mmHg (02/05 0730) SpO2:  [8 %-100 %] 99 % (02/05 0800) Arterial Line BP: (96-139)/(43-73) 116/64 mmHg (02/05 0800) FiO2 (%):  [50 %] 50 % (02/05 0430) Weight:  [192 lb 3.9 oz (87.2 kg)] 192 lb 3.9 oz (87.2 kg) (02/05 0400) Last BM Date: 01/17/15  Intake/Output from previous day: 02/04 0701 - 02/05 0700 In: 4911.7 [I.V.:2671.7; NG/GT:20; IV Piggyback:600; TPN:1610] Out: 5654 [Urine:533; Drains:1170] Intake/Output this shift: Total I/O In: -  Out: 138 [Other:138]  PE: Gen:  Intubated, sedated, unable to respond Card:  RRR, no M/G/R heard Pulm:  On vent, CTA, no W/R/R Abd: Firm but not hard, distended, not able to determine if he is tender, no response to pain, +BS, HSM palpated Ext:  No erythema, or tenderness, edema to all 4 extremities, petechial rash developing on face, chest, arms.  Lab Results:   Recent Labs  01/16/15 1904 01/16/15 2340 01/18/15 0400  WBC 31.1*  --  10.5  HGB 10.3* 10.5* 8.4*  HCT 33.2* 31.0* 25.6*  PLT 120*  --  33*   BMET  Recent Labs  01/17/15 1545 01/18/15 0415  NA 133* 132*  K 4.3 4.1  CL 88* 93*  CO2 34* 34*  GLUCOSE 165* 275*  BUN 60* 57*  CREATININE 1.80* 1.66*  CALCIUM 7.0* 6.6*   PT/INR  Recent Labs  01/17/15 0345  LABPROT 20.8*  INR 1.78*   CMP     Component Value Date/Time   NA 132* 01/18/2015 0415   K 4.1 01/18/2015 0415   CL 93* 01/18/2015 0415   CO2 34* 01/18/2015 0415   GLUCOSE 275* 01/18/2015 0415   BUN 57* 01/18/2015 0415   CREATININE 1.66* 01/18/2015 0415   CREATININE 1.62* 12/04/2014 1222   CALCIUM 6.6*  01/18/2015 0415   PROT 4.9* 01/17/2015 0345   ALBUMIN 2.1* 01/18/2015 0415   AST 287* 01/17/2015 0345   ALT 115* 01/17/2015 0345   ALKPHOS 125* 01/17/2015 0345   BILITOT 4.8* 01/17/2015 0345   GFRNONAA 51* 01/18/2015 0415   GFRNONAA 82 05/02/2013 1425   GFRAA 59* 01/18/2015 0415   GFRAA >89 05/02/2013 1425   Lipase     Component Value Date/Time   LIPASE 24 01/16/2015 2019       Studies/Results: Ct Abdomen Pelvis Wo Contrast  01/17/2015   CLINICAL DATA:  Septic shock. History of Crohn's disease. Worsening acidosis and leukocytosis. Renal failure.  EXAM: CT ABDOMEN AND PELVIS WITHOUT CONTRAST  TECHNIQUE: Multidetector CT imaging of the abdomen and pelvis was performed following the standard protocol without IV contrast.  COMPARISON:  Most recent CT 12/30/2014  FINDINGS: Bilateral airspace disease in the lower lobes, most significant in the left lower lobe with combination of ground-glass and confluent opacities.  Gastrostomy tube in the stomach. Tip of the enteric tube in the stomach. There is abnormal appearance of small bowel in the lower abdomen. There is diffuse small bowel thickening, mesenteric edema and mesenteric free fluid. These decreased bowel distention compared to prior. There is questionable pneumatosis involving small bowel loops in the central abdomen, axial image number 55/114. There is  diffuse colonic wall thickening involving the splenic flexure through the sigmoid colon that has likely progressed from prior exam. There is diffuse colonic wall thickening of the ascending colon that is also progressed. No definite perforation or free air.  The spleen is enlarged measuring 20 cm in greatest dimension with a volume of 2025 mL. There is enlargement of the left hepatic lobe. Periportal edema is again seen. Clips in the gallbladder fossa from cholecystectomy. Pancreas is grossly unremarkable. Adrenal glands are normal. Cystic lesion adjacent of the superior right kidney abutting the  right lower liver, unchanged. There is decreased hydronephrosis from prior.  Mesenteric edema and free fluid, with ascites in the pelvis. The bladder is decompressed by Foley catheter. There is diffuse whole body wall edema consistent with third spacing.  No acute osseous abnormality.  IMPRESSION: 1. Diffuse abnormal appearance of the bowel with multifocal bowel wall thick in. There is probable pneumatosis involving small bowel in the lower abdomen. No definite perforation. There is increased mesenteric edema and free fluid from prior exam. 2. Decreased hydronephrosis. 3. Splenomegaly. 4. Worsening airspace disease in the lower lungs, left greater than right.  These results of pneumatosis were called by telephone at the time of interpretation on 01/17/2015 at 3:38 am to Dr. Craige Cotta, who verbally acknowledged these results.   Electronically Signed   By: Rubye Oaks M.D.   On: 01/17/2015 03:39   Dg Chest Port 1 View  01/18/2015   CLINICAL DATA:  39 year old male with sepsis and ARDS. Initial encounter.  EXAM: PORTABLE CHEST - 1 VIEW  COMPARISON:  01/17/2015 and earlier.  FINDINGS: Portable AP semi upright view at 0543 hrs. Endotracheal tube tip is slightly lower, within normal limits. Stable left IJ central line. Stable visible enteric tube. Stable right IJ dual lumen catheter.  Upper lobe predominant hazy and confluent pulmonary opacity. Stable lung volumes. Stable cardiac size and mediastinal contours. No pneumothorax. No pleural effusion identified. Overall ventilation has not significantly changed since 01/16/2015.  IMPRESSION: 1.  Stable lines and tubes. 2. Stable ventilation with upper lobe predominant hazy and confluent opacity.   Electronically Signed   By: Augusto Gamble M.D.   On: 01/18/2015 07:10   Dg Chest Port 1 View  01/17/2015   CLINICAL DATA:  Increased density at diffuse airspace disease, likely from  EXAM: PORTABLE CHEST - 1 VIEW  COMPARISON:  Chest x-ray from earlier today  FINDINGS: Stable  positioning of endotracheal and orogastric tubes. There is a new left IJ central line, tip at the upper SVC. No complicating pneumothorax. The right upper extremity PICC is in stable position, tip at the upper cavoatrial junction. Dialysis or pheresis catheter via right IJ approach is in stable position.  Lower lung volumes, which likely accounts for the diffuse increased density of bilateral airspace disease, asymmetric to the left. No increasing pleural fluid.  Stable, normal heart size.  IMPRESSION: 1. New left IJ catheter is in good position.  No pneumothorax. 2. Diffuse pneumonia and/or noncardiogenic edema. Interval increase in density is likely from lower lung volumes.   Electronically Signed   By: Tiburcio Pea M.D.   On: 01/17/2015 11:40   Dg Chest Port 1 View  01/17/2015   CLINICAL DATA:  History of aspiration pneumonia, septic shock. , hepatic cirrhosis, respiratory failure.  EXAM: PORTABLE CHEST - 1 VIEW  COMPARISON:  Portable chest x-ray of January 17, 2015 at 1:24 a.m.  FINDINGS: There is persistent confluent bilateral airspace disease not greatly changed from the earlier  study. There is relative sparing of the lung bases. The cardiopericardial silhouette is top-normal in size. The pulmonary vascularity is prominent centrally but stable. The endotracheal tube tip lies 4.3 cm above the crotch of the carina. The esophagogastric tube tip projects below the inferior margin of the image. The large caliber right internal jugular catheter tip projects over the proximal portion of the SVC. The right-sided PICC line tip projects over the midportion of the SVC.  IMPRESSION: Stable appearance of the chest with confluent bilateral alveolar opacities consistent with pneumonia, edema, or other alveolar filling process. The support tubes and lines are in appropriate position radiographically.   Electronically Signed   By: David  [place]   On: 01/17/2015 07:58   Dg Chest Port 1 View  01/17/2015   CLINICAL DATA:   Hemodialysis catheter placement.  EXAM: PORTABLE CHEST - 1 VIEW  COMPARISON:  One day prior at 1854 hr  FINDINGS: AP view at 0124 hr: Right dialysis catheter in the mid -distal SVC. There is no pneumothorax. Right upper extremity PICC tip remains in the distal SVC. Endotracheal tube of the thoracic inlet. Enteric tube in place, tip and side port below the diaphragm. The heart size is normal. Bilateral airspace disease, left greater than right, slightly worsened from prior. Question blunting of left costophrenic angle.  IMPRESSION: 1. Tip of the dialysis catheter in the mid SVC.  No pneumothorax. 2. Worsening bilateral airspace disease, pneumonia versus edema.   Electronically Signed   By: Rubye Oaks M.D.   On: 01/17/2015 02:48   Dg Chest Port 1 View  01/16/2015   CLINICAL DATA:  Encounter for intubation  EXAM: PORTABLE CHEST - 1 VIEW  COMPARISON:  01/16/2015  FINDINGS: Endotracheal tube tip is at the clavicular heads. A gastric suction tube enters the stomach. Stable right upper extremity PICC, tip at the upper cavoatrial junction.  Heart size and aortic contours remain normal. Bilateral airspace disease, asymmetric to the left, is unchanged. No increasing pleural fluid or air leak.  IMPRESSION: 1. Stable positioning of tubes and central line. 2. Unchanged bilateral pneumonia and/or noncardiogenic edema.   Electronically Signed   By: Tiburcio Pea M.D.   On: 01/16/2015 19:14   Dg Chest Port 1 View  01/16/2015   CLINICAL DATA:  Respiratory failure  EXAM: PORTABLE CHEST - 1 VIEW  COMPARISON:  January 16, 2015 study obtained earlier in the day  FINDINGS: Endotracheal tube tip is 12.3 cm above the carina. Nasogastric tube tip and side port are in the stomach. Central catheter tip is in the superior vena cava just superior to the cavoatrial junction. No pneumothorax. There is widespread interstitial and patchy alveolar edema bilaterally, essentially stable. Heart is mildly enlarged with pulmonary venous  hypertension. No adenopathy.  IMPRESSION: Tube and catheter positions as described without pneumothorax. Note that the endotracheal tube tip is 12.3 cm above the carina. The appearance is consistent with congestive heart failure, likely with superimposed ARDS. The appearance of the lungs and cardiac silhouette is essentially stable compared to earlier in the day.  Critical Value/emergent results were called by telephone at the time of interpretation on 01/16/2015 at 5:30 pm to Armida Sans, RN, who verbally acknowledged these results.   Electronically Signed   By: Bretta Bang M.D.   On: 01/16/2015 17:30   Portable Chest Xray  01/16/2015   CLINICAL DATA:  Endotracheal tube placement.  EXAM: PORTABLE CHEST - 1 VIEW  COMPARISON:  Earlier the same date and 01/15/2015.  FINDINGS: 1222  hr. Endotracheal tube tip is just below the thoracic inlet, approximately 8 cm above the carina. Nasogastric tube projects below the diaphragm, tip not visualized. Right arm PICC appears unchanged near the SVC right atrial junction. There has been further worsening of the left greater than right bilateral airspace opacities with associated air bronchograms. There is no significant pleural effusion. The heart size and mediastinal contours are stable.  IMPRESSION: Endotracheal tube tip is just below the thoracic inlet. This could be advanced approximately 4 cm for more optimal positioning. Progression of bilateral airspace opacities worrisome for ARDS.   Electronically Signed   By: Roxy Horseman M.D.   On: 01/16/2015 12:35   Dg Chest Port 1 View  01/16/2015   CLINICAL DATA:  Pneumonia.  EXAM: PORTABLE CHEST - 1 VIEW  COMPARISON:  01/15/2015 and 12/26/2014  FINDINGS: There has been marked progression of bilateral pulmonary infiltrates particularly in the left upper lobe. PICC appears in good position. Heart size and vascularity are normal. I suspect there are small bilateral pleural effusions.  IMPRESSION: Marked progression of  extensive bilateral pulmonary infiltrates. Probable new effusions.   Electronically Signed   By: Geanie Cooley M.D.   On: 01/16/2015 11:16   Dg Chest Port 1v Same Day  01/16/2015   CLINICAL DATA:  Endotracheal tube repositioning.  Initial encounter.  EXAM: PORTABLE CHEST - 1 VIEW SAME DAY  COMPARISON:  Earlier the same day.  FINDINGS: 1258 hr. The endotracheal tube partially overlaps the nasogastric tube and is not optimally visualized, although appears to extend to approximately 3.1 cm above the carina. The right arm PICC and nasogastric tube are unchanged. Left greater than right airspace opacities have not significantly progressed. There is no evidence of pneumothorax.  IMPRESSION: Apparent repositioning of the endotracheal tube in the mid trachea (not well seen). No significant change in bilateral airspace opacities.   Electronically Signed   By: Roxy Horseman M.D.   On: 01/16/2015 13:38   Dg Abd Portable 1v  01/16/2015   CLINICAL DATA:  39 year old male with 1 day history of abdominal pain and distension  EXAM: PORTABLE ABDOMEN - 1 VIEW  COMPARISON:  Prior CT abdomen/pelvis 12/30/2014 ; prior acute abdominal series 113 2016  FINDINGS: Percutaneous gastrostomy tube projects over the right upper quadrant in similar position compared to prior imaging. Additionally, a nasogastric tube appears present in the more proximal aspect of the stomach. The bowel gas pattern is not obstructed on this single view. No large free air although this is a supine study. A femoral vascular catheter projects over the right hip.  IMPRESSION: 1. Nonobstructed bowel gas pattern. 2. Nasogastric tube and percutaneous gastrostomy tubes project over the stomach.   Electronically Signed   By: Malachy Moan M.D.   On: 01/16/2015 20:52    Anti-infectives: Anti-infectives    Start     Dose/Rate Route Frequency Ordered Stop   01/17/15 1800  vancomycin (VANCOCIN) IVPB 1000 mg/200 mL premix     1,000 mg200 mL/hr over 60 Minutes  Intravenous Every 24 hours 01/17/15 0140     01/17/15 0600  piperacillin-tazobactam (ZOSYN) IVPB 3.375 g     3.375 g100 mL/hr over 30 Minutes Intravenous 4 times per day 01/17/15 0140     01/16/15 2300  micafungin (MYCAMINE) 100 mg in sodium chloride 0.9 % 100 mL IVPB     100 mg100 mL/hr over 1 Hours Intravenous Daily at bedtime 01/16/15 2140     01/16/15 2200  piperacillin-tazobactam (ZOSYN) IVPB 3.375 g  Status:  Discontinued     3.375 g12.5 mL/hr over 240 Minutes Intravenous 3 times per day 01/16/15 1733 01/17/15 0133   01/16/15 1800  vancomycin (VANCOCIN) IVPB 1000 mg/200 mL premix  Status:  Discontinued     1,000 mg200 mL/hr over 60 Minutes Intravenous Every 8 hours 01/16/15 1733 01/17/15 0134   01/16/15 0400  vancomycin (VANCOCIN) IVPB 1000 mg/200 mL premix  Status:  Discontinued     1,000 mg200 mL/hr over 60 Minutes Intravenous Every 12 hours 01/15/15 1836 01/16/15 1710   01/15/15 2200  piperacillin-tazobactam (ZOSYN) IVPB 3.375 g  Status:  Discontinued     3.375 g12.5 mL/hr over 240 Minutes Intravenous 3 times per day 01/15/15 1853 01/16/15 1710   01/15/15 1600  vancomycin (VANCOCIN) IVPB 1000 mg/200 mL premix     1,000 mg200 mL/hr over 60 Minutes Intravenous  Once 01/15/15 1557 01/15/15 1754   01/15/15 1500  ceFEPIme (MAXIPIME) 2 g in dextrose 5 % 50 mL IVPB     2 g100 mL/hr over 30 Minutes Intravenous  Once 01/15/15 1458 01/15/15 1540   01/15/15 1500  clindamycin (CLEOCIN) IVPB 600 mg     600 mg100 mL/hr over 30 Minutes Intravenous  Once 01/15/15 1458 01/15/15 1537       Assessment/Plan Septic shock - fungemia Thickened bowel wall, pneumotosis, mesenteric thickening Acute hypoxic RF, possible ARDS 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  No surgical interventions indicated at this time.  Outcome may be poor in this case. 2.  According to his November 2015 stay at [facility], an  exlap was attempted for SBO, but aborted after the inability to gain access to his abd for 2 hours. A decompressive Gtube was placed thereafter.  Has been seen at [facility] for possible SB transplant. 3.  Doubt there has been much change in his intra-abdominal adhesions since his last surgery. Patient remains a poor operative candidate secondary to his surgical hx and current condition. At this time I would recommend con't abx and supportive care. 4.  Likely needs palliative consult or GOC discussion     LOS: 3 days    DORT, Gabriel Conry 01/18/2015, 8:11 AM Pager: [phone]

## 2015-01-18 NOTE — Progress Notes (Signed)
40 mls versed wasted .witnessed by [employer]

## 2015-01-18 NOTE — Progress Notes (Signed)
PULMONARY / CRITICAL CARE MEDICINE   Name: David [name] MRN: [id number] DOB: 09-Feb-1976    ADMISSION DATE:  01/15/2015 CONSULTATION DATE:  01/18/2015  REFERRING MD :  [facility]  CHIEF COMPLAINT:  Dyspnea  INITIAL PRESENTATION:  39 y.o. M who presented to [facility] ED on 2/2 with 2 days of dyspnea, cough, fevers.  He was found to be hypoxic requiring NRB and CXR suggestive of HCAP/ARDS/ fungemia Maintained on TNA via RUE PICC. 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   STUDIES:  CXR 2/3 >>> progression of b/l opacities worrisome for ARDS.   SIGNIFICANT EVENTS: 2/2 - admitted to [facility] 2/3 - intubated, transferred to [facility] ICU.  Started on ARDS protocol  SUBJECTIVE: shock improving , on lower levo &  Off neo gtt Minimal UO on CRRT Sedated on versed + fent  gtt, RASS -5  VITAL SIGNS: Temp:  [96.4 F (35.8 C)-98.9 F (37.2 C)] 98.9 F (37.2 C) (02/05 0714) Pulse Rate:  [58-103] 71 (02/05 0800) Resp:  [18-35] 25 (02/05 0800) BP: (88-143)/(46-79) 96/55 mmHg (02/05 0730) SpO2:  [8 %-100 %] 99 % (02/05 0800) Arterial Line BP: (96-139)/(43-73) 116/64 mmHg (02/05 0800) FiO2 (%):  [50 %] 50 % (02/05 0430) Weight:  [87.2 kg (192 lb 3.9 oz)] 87.2 kg (192 lb 3.9 oz) (02/05 0400) HEMODYNAMICS: CVP:  [6 mmHg-13 mmHg] 9 mmHg VENTILATOR SETTINGS: Vent Mode:  [-] PRVC FiO2 (%):  [50 %] 50 % Set Rate:  [24 bmp-30 bmp] 24 bmp Vt Set:  [520 mL] 520 mL PEEP:  [8 cmH20-10 cmH20] 8 cmH20 Plateau Pressure:  [20 cmH20-27 cmH20] 20 cmH20 INTAKE / OUTPUT: Intake/Output      02/04 0701 - 02/05 0700 02/05 0701 - 02/06 0700   I.V. (mL/kg) 2671.7 (30.6)    Other 10    NG/GT 20    IV Piggyback 600    TPN 1610    Total Intake(mL/kg) 4911.7 (56.3)    Urine (mL/kg/hr) 533 (0.3)     Emesis/NG output     Drains 1170 (0.6)    Other 3951 (1.9) 138 (1)   Stool 0 (0)    Total Output 5654 138   Net -742.3 -138        Stool Occurrence 2 x      PHYSICAL EXAMINATION: General: Young, chronically ill appearing male, in NAD. Neuro: Sedated on vent. HEENT: [place]/AT. PERRL, sclerae anicteric.  ETT in place. Cardiovascular: Tachy, regular, no M/R/G.  Lungs: Respirations even and unlabored.  Coarse BS bilaterally. Abdomen: Old abdominal incisions noted.  PEG in place.  BS hypoactive, soft, NT/ND.  Musculoskeletal: No gross deformities, 1+ pitting edema / anasarca.  LUE PICC. Skin: Intact, warm, no rashes.  LABS:  CBC  Recent Labs Lab 01/16/15 0442 01/16/15 1904 01/16/15 2340 01/18/15 0400  WBC 9.1 31.1*  --  10.5  HGB 9.5* 10.3* 10.5* 8.4*  HCT 30.9* 33.2* 31.0* 25.6*  PLT 57* 120*  --  33*   Coag's  Recent Labs Lab 01/17/15 0345  INR 1.78*   BMET  Recent Labs Lab 01/17/15 0752 01/17/15 1545 01/18/15 0415  NA 132* 133* 132*  K 5.1 4.3 4.1  CL 94* 88* 93*  CO2 22 34* 34*  BUN 69* 60* 57*  CREATININE 2.12* 1.80* 1.66*  GLUCOSE 160* 165* 275*   Electrolytes  Recent Labs Lab 01/17/15 0345 01/17/15 0752 01/17/15 1545 01/18/15 0415  CALCIUM 5.8*  6.6* 6.5* 7.0* 6.6*  MG 1.9  1.7  --   --  2.2  PHOS 6.3*  6.8* 6.1* 5.4* 3.8   Sepsis Markers  Recent Labs Lab 01/15/15 1521 01/16/15 1904  LATICACIDVEN 2.72* 4.7*  PROCALCITON  --  30.89   ABG  Recent Labs Lab 01/17/15 0723 01/17/15 1635 01/18/15 0418  PHART 7.377 7.526* 7.411  PCO2ART 40.7 44.0 49.5*  PO2ART 89.0 72.0* 89.0   Liver Enzymes  Recent Labs Lab 01/16/15 1904 01/17/15 0020 01/17/15 0345 01/17/15 0752 01/17/15 1545 01/18/15 0415  AST 257* 280* 287*  --   --   --   ALT 108* 113* 115*  --   --   --   ALKPHOS 198* 140* 125*  --   --   --   BILITOT 4.9* 5.0* 4.8*  --   --   --   ALBUMIN 2.5* 2.4* 2.0*  2.2* 2.4* 2.3* 2.1*   Cardiac Enzymes  Recent  Labs Lab 01/16/15 1904  TROPONINI 0.07*   Glucose  Recent Labs Lab 01/17/15 1142 01/17/15 1523 01/17/15 1938 01/17/15 2320 01/18/15 0416 01/18/15 0710  GLUCAP 160* 157* 246* 276* 272* 232*    Imaging Ct Abdomen Pelvis Wo Contrast  01/17/2015   CLINICAL DATA:  Septic shock. History of Crohn's disease. Worsening acidosis and leukocytosis. Renal failure.  EXAM: CT ABDOMEN AND PELVIS WITHOUT CONTRAST  TECHNIQUE: Multidetector CT imaging of the abdomen and pelvis was performed following the standard protocol without IV contrast.  COMPARISON:  Most recent CT 12/30/2014  FINDINGS: Bilateral airspace disease in the lower lobes, most significant in the left lower lobe with combination of ground-glass and confluent opacities.  Gastrostomy tube in the stomach. Tip of the enteric tube in the stomach. There is abnormal appearance of small bowel in the lower abdomen. There is diffuse small bowel thickening, mesenteric edema and mesenteric free fluid. These decreased bowel distention compared to prior. There is questionable pneumatosis involving small bowel loops in the central abdomen, axial image number 55/114. There is diffuse colonic wall thickening involving the splenic flexure through the sigmoid colon that has likely progressed from prior exam. There is diffuse colonic wall thickening of the ascending colon that is also progressed. No definite perforation or free air.  The spleen is enlarged measuring 20 cm in greatest dimension with a volume of 2025 mL. There is enlargement of the left hepatic lobe. Periportal edema is again seen. Clips in the gallbladder fossa from cholecystectomy. Pancreas is grossly unremarkable. Adrenal glands are normal. Cystic lesion adjacent of the superior right kidney abutting the right lower liver, unchanged. There is decreased hydronephrosis from prior.  Mesenteric edema and free fluid, with ascites in the pelvis. The bladder is decompressed by Foley catheter. There is diffuse  whole body wall edema consistent with third spacing.  No acute osseous abnormality.  IMPRESSION: 1. Diffuse abnormal appearance of the bowel with multifocal bowel wall thick in. There is probable pneumatosis involving small bowel in the lower abdomen. No definite perforation. There is increased mesenteric edema and free fluid from prior exam. 2. Decreased hydronephrosis. 3. Splenomegaly. 4. Worsening airspace disease in the lower lungs, left greater than right.  These results of pneumatosis were called by telephone at the time of interpretation on 01/17/2015 at 3:38 am to Dr. Craige Cotta, who verbally acknowledged these results.  Electronically Signed   By: Rubye Oaks M.D.   On: 01/17/2015 03:39   Dg Chest Port 1 View  01/18/2015   CLINICAL DATA:  39 year old male with sepsis and ARDS. Initial encounter.  EXAM: PORTABLE CHEST - 1 VIEW  COMPARISON:  01/17/2015 and earlier.  FINDINGS: Portable AP semi upright view at 0543 hrs. Endotracheal tube tip is slightly lower, within normal limits. Stable left IJ central line. Stable visible enteric tube. Stable right IJ dual lumen catheter.  Upper lobe predominant hazy and confluent pulmonary opacity. Stable lung volumes. Stable cardiac size and mediastinal contours. No pneumothorax. No pleural effusion identified. Overall ventilation has not significantly changed since 01/16/2015.  IMPRESSION: 1.  Stable lines and tubes. 2. Stable ventilation with upper lobe predominant hazy and confluent opacity.   Electronically Signed   By: Augusto Gamble M.D.   On: 01/18/2015 07:10   Dg Chest Port 1 View  01/17/2015   CLINICAL DATA:  Increased density at diffuse airspace disease, likely from  EXAM: PORTABLE CHEST - 1 VIEW  COMPARISON:  Chest x-ray from earlier today  FINDINGS: Stable positioning of endotracheal and orogastric tubes. There is a new left IJ central line, tip at the upper SVC. No complicating pneumothorax. The right upper extremity PICC is in stable position, tip at the upper  cavoatrial junction. Dialysis or pheresis catheter via right IJ approach is in stable position.  Lower lung volumes, which likely accounts for the diffuse increased density of bilateral airspace disease, asymmetric to the left. No increasing pleural fluid.  Stable, normal heart size.  IMPRESSION: 1. New left IJ catheter is in good position.  No pneumothorax. 2. Diffuse pneumonia and/or noncardiogenic edema. Interval increase in density is likely from lower lung volumes.   Electronically Signed   By: Tiburcio Pea M.D.   On: 01/17/2015 11:40   Dg Chest Port 1 View  01/17/2015   CLINICAL DATA:  History of aspiration pneumonia, septic shock. , hepatic cirrhosis, respiratory failure.  EXAM: PORTABLE CHEST - 1 VIEW  COMPARISON:  Portable chest x-ray of January 17, 2015 at 1:24 a.m.  FINDINGS: There is persistent confluent bilateral airspace disease not greatly changed from the earlier study. There is relative sparing of the lung bases. The cardiopericardial silhouette is top-normal in size. The pulmonary vascularity is prominent centrally but stable. The endotracheal tube tip lies 4.3 cm above the crotch of the carina. The esophagogastric tube tip projects below the inferior margin of the image. The large caliber right internal jugular catheter tip projects over the proximal portion of the SVC. The right-sided PICC line tip projects over the midportion of the SVC.  IMPRESSION: Stable appearance of the chest with confluent bilateral alveolar opacities consistent with pneumonia, edema, or other alveolar filling process. The support tubes and lines are in appropriate position radiographically.   Electronically Signed   By: David  [place]   On: 01/17/2015 07:58   Dg Chest Port 1 View  01/17/2015   CLINICAL DATA:  Hemodialysis catheter placement.  EXAM: PORTABLE CHEST - 1 VIEW  COMPARISON:  One day prior at 1854 hr  FINDINGS: AP view at 0124 hr: Right dialysis catheter in the mid -distal SVC. There is no pneumothorax.  Right upper extremity PICC tip remains in the distal SVC. Endotracheal tube of the thoracic inlet. Enteric tube in place, tip and side port below the diaphragm. The heart size is normal. Bilateral airspace disease, left greater than right, slightly worsened from prior. Question blunting of left costophrenic angle.  IMPRESSION: 1. Tip of the dialysis catheter in the mid SVC.  No pneumothorax. 2. Worsening bilateral airspace disease, pneumonia versus edema.   Electronically Signed   By: Rubye Oaks M.D.   On: 01/17/2015 02:48   Dg Chest Port 1 View  01/16/2015   CLINICAL DATA:  Encounter for intubation  EXAM: PORTABLE CHEST - 1 VIEW  COMPARISON:  01/16/2015  FINDINGS: Endotracheal tube tip is at the clavicular heads. A gastric suction tube enters the stomach. Stable right upper extremity PICC, tip at the upper cavoatrial junction.  Heart size and aortic contours remain normal. Bilateral airspace disease, asymmetric to the left, is unchanged. No increasing pleural fluid or air leak.  IMPRESSION: 1. Stable positioning of tubes and central line. 2. Unchanged bilateral pneumonia and/or noncardiogenic edema.   Electronically Signed   By: Tiburcio Pea M.D.   On: 01/16/2015 19:14   Dg Chest Port 1 View  01/16/2015   CLINICAL DATA:  Respiratory failure  EXAM: PORTABLE CHEST - 1 VIEW  COMPARISON:  January 16, 2015 study obtained earlier in the day  FINDINGS: Endotracheal tube tip is 12.3 cm above the carina. Nasogastric tube tip and side port are in the stomach. Central catheter tip is in the superior vena cava just superior to the cavoatrial junction. No pneumothorax. There is widespread interstitial and patchy alveolar edema bilaterally, essentially stable. Heart is mildly enlarged with pulmonary venous hypertension. No adenopathy.  IMPRESSION: Tube and catheter positions as described without pneumothorax. Note that the endotracheal tube tip is 12.3 cm above the carina. The appearance is consistent with  congestive heart failure, likely with superimposed ARDS. The appearance of the lungs and cardiac silhouette is essentially stable compared to earlier in the day.  Critical Value/emergent results were called by telephone at the time of interpretation on 01/16/2015 at 5:30 pm to Armida Sans, RN, who verbally acknowledged these results.   Electronically Signed   By: Bretta Bang M.D.   On: 01/16/2015 17:30   Portable Chest Xray  01/16/2015   CLINICAL DATA:  Endotracheal tube placement.  EXAM: PORTABLE CHEST - 1 VIEW  COMPARISON:  Earlier the same date and 01/15/2015.  FINDINGS: 1222 hr. Endotracheal tube tip is just below the thoracic inlet, approximately 8 cm above the carina. Nasogastric tube projects below the diaphragm, tip not visualized. Right arm PICC appears unchanged near the SVC right atrial junction. There has been further worsening of the left greater than right bilateral airspace opacities with associated air bronchograms. There is no significant pleural effusion. The heart size and mediastinal contours are stable.  IMPRESSION: Endotracheal tube tip is just below the thoracic inlet. This could be advanced approximately 4 cm for more optimal positioning. Progression of bilateral airspace opacities worrisome for ARDS.   Electronically Signed   By: Roxy Horseman M.D.   On: 01/16/2015 12:35   Dg Chest Port 1 View  01/16/2015   CLINICAL DATA:  Pneumonia.  EXAM: PORTABLE CHEST - 1 VIEW  COMPARISON:  01/15/2015 and 12/26/2014  FINDINGS: There has been marked progression of bilateral pulmonary infiltrates particularly in the left upper lobe. PICC appears in good position. Heart size and vascularity are normal. I suspect there are small bilateral pleural effusions.  IMPRESSION: Marked progression of extensive bilateral pulmonary infiltrates. Probable new effusions.   Electronically Signed   By: Geanie Cooley M.D.   On: 01/16/2015 11:16   Dg Chest Port 1v Same Day  01/16/2015   CLINICAL DATA:  Endotracheal  tube repositioning.  Initial encounter.  EXAM: PORTABLE CHEST - 1 VIEW SAME DAY  COMPARISON:  Earlier the same day.  FINDINGS: 1258 hr. The endotracheal tube partially overlaps the nasogastric tube and is not optimally visualized, although appears to extend to approximately 3.1 cm above the carina. The right arm PICC and nasogastric tube are unchanged. Left greater than right airspace opacities have not significantly progressed. There is no evidence of pneumothorax.  IMPRESSION: Apparent repositioning of the endotracheal tube in the mid trachea (not well seen). No significant change in bilateral airspace opacities.   Electronically Signed   By: Roxy Horseman M.D.   On: 01/16/2015 13:38   Dg Abd Portable 1v  01/16/2015   CLINICAL DATA:  39 year old male with 1 day history of abdominal pain and distension  EXAM: PORTABLE ABDOMEN - 1 VIEW  COMPARISON:  Prior CT abdomen/pelvis 12/30/2014 ; prior acute abdominal series 113 2016  FINDINGS: Percutaneous gastrostomy tube projects over the right upper quadrant in similar position compared to prior imaging. Additionally, a nasogastric tube appears present in the more proximal aspect of the stomach. The bowel gas pattern is not obstructed on this single view. No large free air although this is a supine study. A femoral vascular catheter projects over the right hip.  IMPRESSION: 1. Nonobstructed bowel gas pattern. 2. Nasogastric tube and percutaneous gastrostomy tubes project over the stomach.   Electronically Signed   By: Malachy Moan M.D.   On: 01/16/2015 20:52    ASSESSMENT / PLAN:  PULMONARY OETT 2/3 >>> A: Acute hypoxic respiratory failure HCAP Concern for ARDS P:    ARDS protocol at 6cc/kg and plat < 30 cm H2O - drop RR to 25, drop PEEP to 5 VAP bundle. SBT when off pressors   CARDIOVASCULAR RUE PICC A:  Septic Shock P:  Goal MAP > 65. Levophed gtt, added vaso &  neo off Stress steroids. Hold outpatient metolazone,  spironolactone.  RENAL A:   AKI - resolving Pseudohypocalcemia   P:   kvo IVFs Off bicarb gtt BMP in AM.  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 Hepatic encephalopathy Chronic PEG tube - eats occasionally  Protein calorie malnutrition P:   Being followed at [facility] for possible small bowel transplant. Routine PEG tube care / maintenance. SUP: Pantoprazole. NPO. TPN per pharmacy. Hold outpatient prednisone, bentyl, probiotics, phernergan, sucralfate, miralax.  HEMATOLOGIC A:   Anemia of chronic disease Thrombocytopenia - appears chronic VTE Prophylaxis P:  Transfuse per usual ICU guidelines. Monitor platelets. SCD's only. CBC in AM. Hold outpatient ferrous gluconate.  INFECTIOUS A:   Septic Shock - suspect due to HCAP; however, consider GI process given prior GI hx P:   BCx2 2/2 >>>yeast >> UCx 2/2 >>> Sputum Cx 2/2 >>> Abx: Vanc, start date 2/2 Abx: Zosyn, start date 2/2, micafungin 2/3 >> ICU procalcitonin algorithm to limit abx exposure. ? Dc abx - defer to ID  ENDOCRINE A:   At risk relative AI given that chronic steroids hyperglycemia P:   Stress steroids. ICU hyperglycemia protocol.   NEUROLOGIC A:   Acute metabolic encephalopathy Chronic Pain P:   Sedation:   Use Midazolam prn & fent gtt (itching NOT an allergy) RASS goal: -2. Daily WUA. Hold outpatient oxycodone.   Family updated: wife 2/4  Interdisciplinary Family  Meeting v Palliative Care Meeting:  Due by: 2/10.  Care during the described time interval was provided by me and/or other providers on the critical care team.  I have reviewed this patient's available data, including medical history, events of note, physical  examination and test results as part of my evaluation  CC time x 66m  ALVA,RAKESH V. MD  01/18/2015, 8:38 AM

## 2015-01-18 NOTE — Progress Notes (Signed)
Inpatient Diabetes Program Recommendations  AACE/ADA: New Consensus Statement on Inpatient Glycemic Control (2013)  Target Ranges:  Prepandial:   less than 140 mg/dL      Peak postprandial:   less than 180 mg/dL (1-2 hours)      Critically ill patients:  140 - 180 mg/dL     Results for David [name], David [name] (MRN [id number]) as of 01/18/2015 09:39  Ref. Range 01/17/2015 23:20 01/18/2015 04:16 01/18/2015 07:10  Glucose-Capillary Latest Range: 70-99 mg/dL 329 (H) 191 (H) 660 (H)     Admitted with ARDS.  History of Chron's, Cirrhosis, HTN.   **Currently getting IV Solucortef 50 mg Q6 hours.  **Glucose levels elevated.    MD- Please start Phase 2 of ICU Glycemic Control Protocol (IV insulin) while patient getting IV steroids and having Hyperglycemia     Will follow Ambrose Finland RN, MSN, CDE Diabetes Coordinator Inpatient Diabetes Program Team Pager: [phone] (8a-10p)

## 2015-01-18 NOTE — Progress Notes (Signed)
[facility]    Subjective: intubated   Antibiotics:  Anti-infectives    Start     Dose/Rate Route Frequency Ordered Stop   01/17/15 1800  vancomycin (VANCOCIN) IVPB 1000 mg/200 mL premix     1,000 mg200 mL/hr over 60 Minutes Intravenous Every 24 hours 01/17/15 0140     01/17/15 0600  piperacillin-tazobactam (ZOSYN) IVPB 3.375 g     3.375 g100 mL/hr over 30 Minutes Intravenous 4 times per day 01/17/15 0140     01/16/15 2300  micafungin (MYCAMINE) 100 mg in sodium chloride 0.9 % 100 mL IVPB     100 mg100 mL/hr over 1 Hours Intravenous Daily at bedtime 01/16/15 2140     01/16/15 2200  piperacillin-tazobactam (ZOSYN) IVPB 3.375 g  Status:  Discontinued     3.375 g12.5 mL/hr over 240 Minutes Intravenous 3 times per day 01/16/15 1733 01/17/15 0133   01/16/15 1800  vancomycin (VANCOCIN) IVPB 1000 mg/200 mL premix  Status:  Discontinued     1,000 mg200 mL/hr over 60 Minutes Intravenous Every 8 hours 01/16/15 1733 01/17/15 0134   01/16/15 0400  vancomycin (VANCOCIN) IVPB 1000 mg/200 mL premix  Status:  Discontinued     1,000 mg200 mL/hr over 60 Minutes Intravenous Every 12 hours 01/15/15 1836 01/16/15 1710   01/15/15 2200  piperacillin-tazobactam (ZOSYN) IVPB 3.375 g  Status:  Discontinued     3.375 g12.5 mL/hr over 240 Minutes Intravenous 3 times per day 01/15/15 1853 01/16/15 1710   01/15/15 1600  vancomycin (VANCOCIN) IVPB 1000 mg/200 mL premix     1,000 mg200 mL/hr over 60 Minutes Intravenous  Once 01/15/15 1557 01/15/15 1754   01/15/15 1500  ceFEPIme (MAXIPIME) 2 g in dextrose 5 % 50 mL IVPB     2 g100 mL/hr over 30 Minutes Intravenous  Once 01/15/15 1458 01/15/15 1540   01/15/15 1500  clindamycin (CLEOCIN) IVPB 600 mg     600 mg100 mL/hr over 30 Minutes Intravenous  Once 01/15/15 1458 01/15/15 1537      Medications: Scheduled Meds: . antiseptic oral rinse  7 mL Mouth Rinse QID  . chlorhexidine  15 mL Mouth Rinse BID  . fentaNYL  50 mcg Intravenous  Once  . hydrocortisone sod succinate (SOLU-CORTEF) inj  50 mg Intravenous Q6H  . insulin aspart  0-15 Units Subcutaneous 6 times per day  . micafungin (MYCAMINE) IV  100 mg Intravenous QHS  . pantoprazole (PROTONIX) IV  40 mg Intravenous Q12H  . piperacillin-tazobactam  3.375 g Intravenous 4 times per day  . THROMBI-PAD  1 each Topical Once  . vancomycin  1,000 mg Intravenous Q24H   Continuous Infusions: . TPN (CLINIMIX) Adult without lytes     And  . fat emulsion    . fentaNYL infusion INTRAVENOUS Stopped (01/18/15 1000)  . norepinephrine (LEVOPHED) Adult infusion 6 mcg/min (01/18/15 1300)  . dialysis replacement fluid (prismasate) 600 mL/hr at 01/18/15 0913  . dialysis replacement fluid (prismasate) 200 mL/hr at 01/18/15 0913  . dialysate (PRISMASATE) 1,500 mL/hr at 01/18/15 1305  . TPN (CLINIMIX) Adult without lytes 120 mL/hr at 01/18/15 0300  . vasopressin (PITRESSIN) infusion - *FOR SHOCK* 0.03 Units/min (01/18/15 1300)   PRN Meds:.sodium chloride, albuterol, fentaNYL, heparin, heparin, midazolam    Objective: Weight change: -8 lb 13.1 oz (-4 kg)  Intake/Output Summary (Last 24 hours) at 01/18/15 1328 Last data filed at 01/18/15 1300  Gross per 24 hour  Intake 4770.68 ml  Output   5833 ml  Net -1062.32 ml   Blood pressure 106/68, pulse 75, temperature 97.9 F (36.6 C), temperature source Axillary, resp. rate 26, height 5\' 11"  (1.803 m), weight 192 lb 3.9 oz (87.2 kg), SpO2 96 %. Temp:  [97.5 F (36.4 C)-98.9 F (37.2 C)] 97.9 F (36.6 C) (02/05 1115) Pulse Rate:  [62-103] 75 (02/05 1315) Resp:  [18-38] 26 (02/05 1315) BP: (88-143)/(46-74) 106/68 mmHg (02/05 1300) SpO2:  [86 %-100 %] 96 % (02/05 1315) Arterial Line BP: (96-143)/(43-73) 103/55 mmHg (02/05 1315) FiO2 (%):  [50 %] 50 % (02/05 0430) Weight:  [192 lb 3.9 oz (87.2 kg)] 192 lb 3.9 oz (87.2 kg) (02/05 0400)  Physical Exam: General: Sedated and intubated HEENT: Normocephalic,  IJ site clean , Right HD  catheter in place and in use on right. CVS regular rate, normal r, no murmur rubs or gallops Chest:rhonchi Abdomen: soft, bandage in place.  Extremities: 2+ edema Skin: no rashes Neuro: nonfocal   CBC:   CBC Latest Ref Rng 01/18/2015 01/16/2015 01/16/2015  WBC 4.0 - 10.5 K/uL 10.5 - 31.1(H)  Hemoglobin 13.0 - 17.0 g/dL 1.6(X) 10.5(L) 10.3(L)  Hematocrit 39.0 - 52.0 % 25.6(L) 31.0(L) 33.2(L)  Platelets 150 - 400 K/uL 33(L) - 120(L)       BMET  Recent Labs  01/17/15 1545 01/18/15 0415  NA 133* 132*  K 4.3 4.1  CL 88* 93*  CO2 34* 34*  GLUCOSE 165* 275*  BUN 60* 57*  CREATININE 1.80* 1.66*  CALCIUM 7.0* 6.6*     Liver Panel   Recent Labs  01/17/15 0020 01/17/15 0345  01/17/15 1545 01/18/15 0415  PROT 5.0* 4.9*  --   --   --   ALBUMIN 2.4* 2.0*  2.2*  < > 2.3* 2.1*  AST 280* 287*  --   --   --   ALT 113* 115*  --   --   --   ALKPHOS 140* 125*  --   --   --   BILITOT 5.0* 4.8*  --   --   --   BILIDIR 3.3*  --   --   --   --   IBILI 1.7*  --   --   --   --   < > = values in this interval not displayed.     Sedimentation Rate No results for input(s): ESRSEDRATE in the last 72 hours. C-Reactive Protein No results for input(s): CRP in the last 72 hours.  Micro Results: Recent Results (from the past 720 hour(s))  Wound culture     Status: None   Collection Time: 12/21/14  6:44 AM  Result Value Ref Range Status   Specimen Description G/T SITE  Final   Special Requests Normal  Final   Gram Stain   Final    FEW WBC PRESENT,BOTH PMN AND MONONUCLEAR NO SQUAMOUS EPITHELIAL CELLS SEEN NO ORGANISMS SEEN Performed at [facility]    Culture   Final    MULTIPLE ORGANISMS PRESENT, NONE PREDOMINANT Note: NO STAPHYLOCOCCUS AUREUS ISOLATED NO GROUP A STREP (S.PYOGENES) ISOLATED Performed at [facility]    Report Status 12/24/2014 FINAL  Final  Culture, Urine     Status: None   Collection Time: 12/21/14  7:35 AM  Result Value Ref Range Status    Specimen Description URINE, CLEAN CATCH  Final   Special Requests Normal  Final   Colony Count   Final    2,000 COLONIES/ML Performed at [facility]  Final    INSIGNIFICANT GROWTH Performed at [facility]    Report Status 12/22/2014 FINAL  Final  Culture, blood (routine x 2)     Status: None   Collection Time: 12/21/14  8:26 AM  Result Value Ref Range Status   Specimen Description BLOOD LEFT ANTECUBITAL  Final   Special Requests BOTTLES DRAWN AEROBIC AND ANAEROBIC 10CC BOTTLES  Final   Culture NO GROWTH 5 DAYS  Final   Report Status 12/26/2014 FINAL  Final  Culture, blood (routine x 2)     Status: None   Collection Time: 12/21/14  8:31 AM  Result Value Ref Range Status   Specimen Description BLOOD LEFT HAND  Final   Special Requests BOTTLES DRAWN AEROBIC ONLY 6CC BOTTLE  Final   Culture NO GROWTH 5 DAYS  Final   Report Status 12/26/2014 FINAL  Final  Clostridium Difficile by PCR     Status: None   Collection Time: 12/21/14  1:56 PM  Result Value Ref Range Status   C difficile by pcr NEGATIVE NEGATIVE Final  Wound culture     Status: None   Collection Time: 12/24/14  2:30 PM  Result Value Ref Range Status   Specimen Description WOUND GASTROSTOMY TUBE SITE  Final   Special Requests NONE  Final   Gram Stain   Final    RARE WBC PRESENT, PREDOMINANTLY MONONUCLEAR NO SQUAMOUS EPITHELIAL CELLS SEEN NO ORGANISMS SEEN Performed at [facility]    Culture   Final    RARE PSEUDOMONAS AERUGINOSA Performed at [facility]    Report Status 12/28/2014 FINAL  Final   Organism ID, Bacteria PSEUDOMONAS AERUGINOSA  Final      Susceptibility   Pseudomonas aeruginosa - MIC*    CEFEPIME 8 SENSITIVE Sensitive     CEFTAZIDIME 4 SENSITIVE Sensitive     CIPROFLOXACIN <=0.25 SENSITIVE Sensitive     GENTAMICIN 8 INTERMEDIATE Intermediate     IMIPENEM 1 SENSITIVE Sensitive     PIP/TAZO 8 SENSITIVE Sensitive     TOBRAMYCIN <=1 SENSITIVE  Sensitive     * RARE PSEUDOMONAS AERUGINOSA  Clostridium Difficile by PCR     Status: None   Collection Time: 12/26/14 10:16 PM  Result Value Ref Range Status   C difficile by pcr NEGATIVE NEGATIVE Final  Wound culture     Status: None   Collection Time: 12/31/14 12:54 AM  Result Value Ref Range Status   Specimen Description WOUND ABDOMEN  Final   Special Requests IMMUNE:COMPROMISED  Final   Gram Stain   Final    RARE WBC PRESENT, PREDOMINANTLY MONONUCLEAR NO SQUAMOUS EPITHELIAL CELLS SEEN RARE GRAM POSITIVE COCCI IN PAIRS IN CLUSTERS FEW YEAST Performed at [facility]    Culture   Final    MULTIPLE ORGANISMS PRESENT, NONE PREDOMINANT Note: NO GROUP A STREP (S.PYOGENES) ISOLATED NO STAPHYLOCOCCUS AUREUS ISOLATED Performed at [facility]    Report Status 01/03/2015 FINAL  Final  Blood culture (routine x 2)     Status: None (Preliminary result)   Collection Time: 01/15/15  3:07 PM  Result Value Ref Range Status   Specimen Description BLOOD LEFT ANTECUBITAL  Final   Special Requests BOTTLES DRAWN AEROBIC AND ANAEROBIC [facility]  Final   Culture   Final    YEAST Note: Gram Stain Report Called to,Read Back By and Verified With: Armida Sans 1803 01/16/15 BY Ginette Pitman Performed at [facility] Performed at [facility]    Report Status  PENDING  Incomplete  Blood culture (routine x 2)     Status: None (Preliminary result)   Collection Time: 01/15/15  3:12 PM  Result Value Ref Range Status   Specimen Description BLOOD LEFT ARM  Final   Special Requests BOTTLES DRAWN AEROBIC AND ANAEROBIC 8CC  Final   Culture   Final    YEAST Note: Gram Stain Report Called to,Read Back By and Verified With: Armida Sans 1803 01/16/15 BY Ginette Pitman Performed at [facility] Performed at [facility]    Report Status PENDING  Incomplete  MRSA PCR Screening     Status: None   Collection Time: 01/15/15  6:33 PM  Result Value Ref Range Status   MRSA by PCR  NEGATIVE NEGATIVE Final    Comment:        The GeneXpert MRSA Assay (FDA approved for NASAL specimens only), is one component of a comprehensive MRSA colonization surveillance program. It is not intended to diagnose MRSA infection nor to guide or monitor treatment for MRSA infections.   Culture, respiratory (tracheal aspirate)     Status: None (Preliminary result)   Collection Time: 01/16/15  5:46 PM  Result Value Ref Range Status   Specimen Description TRACHEAL ASPIRATE  Final   Special Requests Normal  Final   Gram Stain   Final    FEW WBC PRESENT, PREDOMINANTLY PMN NO SQUAMOUS EPITHELIAL CELLS SEEN NO ORGANISMS SEEN Performed at [facility]    Culture   Final    NO GROWTH 1 DAY Performed at [facility]    Report Status PENDING  Incomplete  Culture, blood (routine x 2)     Status: None (Preliminary result)   Collection Time: 01/16/15  6:30 PM  Result Value Ref Range Status   Specimen Description BLOOD LEFT ARM  Final   Special Requests BOTTLES DRAWN AEROBIC AND ANAEROBIC 5CC  Final   Culture   Final           BLOOD CULTURE RECEIVED NO GROWTH TO DATE CULTURE WILL BE HELD FOR 5 DAYS BEFORE ISSUING A FINAL NEGATIVE REPORT Performed at [facility]    Report Status PENDING  Incomplete  Culture, blood (routine x 2)     Status: None (Preliminary result)   Collection Time: 01/16/15  6:45 PM  Result Value Ref Range Status   Specimen Description BLOOD LEFT HAND  Final   Special Requests BOTTLES DRAWN AEROBIC AND ANAEROBIC 5CC  Final   Culture   Final           BLOOD CULTURE RECEIVED NO GROWTH TO DATE CULTURE WILL BE HELD FOR 5 DAYS BEFORE ISSUING A FINAL NEGATIVE REPORT Performed at [facility]    Report Status PENDING  Incomplete  Culture, respiratory (NON-Expectorated)     Status: None (Preliminary result)   Collection Time: 01/17/15  8:40 AM  Result Value Ref Range Status   Specimen Description TRACHEAL SITE  Final   Special Requests  NONE  Final   Gram Stain   Final    FEW WBC PRESENT,BOTH PMN AND MONONUCLEAR RARE SQUAMOUS EPITHELIAL CELLS PRESENT NO ORGANISMS SEEN Performed at [facility]    Culture   Final    NO GROWTH 1 DAY Performed at [facility]    Report Status PENDING  Incomplete  Culture, blood (routine x 2)     Status: None (Preliminary result)   Collection Time: 01/17/15  1:13 PM  Result Value Ref Range Status  Specimen Description BLOOD LEFT HAND  Final   Special Requests BOTTLES DRAWN AEROBIC ONLY 7.5CC  Final   Culture   Final           BLOOD CULTURE RECEIVED NO GROWTH TO DATE CULTURE WILL BE HELD FOR 5 DAYS BEFORE ISSUING A FINAL NEGATIVE REPORT Performed at [facility]    Report Status PENDING  Incomplete    Studies/Results: Ct Abdomen Pelvis Wo Contrast  01/17/2015   CLINICAL DATA:  Septic shock. History of Crohn's disease. Worsening acidosis and leukocytosis. Renal failure.  EXAM: CT ABDOMEN AND PELVIS WITHOUT CONTRAST  TECHNIQUE: Multidetector CT imaging of the abdomen and pelvis was performed following the standard protocol without IV contrast.  COMPARISON:  Most recent CT 12/30/2014  FINDINGS: Bilateral airspace disease in the lower lobes, most significant in the left lower lobe with combination of ground-glass and confluent opacities.  Gastrostomy tube in the stomach. Tip of the enteric tube in the stomach. There is abnormal appearance of small bowel in the lower abdomen. There is diffuse small bowel thickening, mesenteric edema and mesenteric free fluid. These decreased bowel distention compared to prior. There is questionable pneumatosis involving small bowel loops in the central abdomen, axial image number 55/114. There is diffuse colonic wall thickening involving the splenic flexure through the sigmoid colon that has likely progressed from prior exam. There is diffuse colonic wall thickening of the ascending colon that is also progressed. No definite perforation or  free air.  The spleen is enlarged measuring 20 cm in greatest dimension with a volume of 2025 mL. There is enlargement of the left hepatic lobe. Periportal edema is again seen. Clips in the gallbladder fossa from cholecystectomy. Pancreas is grossly unremarkable. Adrenal glands are normal. Cystic lesion adjacent of the superior right kidney abutting the right lower liver, unchanged. There is decreased hydronephrosis from prior.  Mesenteric edema and free fluid, with ascites in the pelvis. The bladder is decompressed by Foley catheter. There is diffuse whole body wall edema consistent with third spacing.  No acute osseous abnormality.  IMPRESSION: 1. Diffuse abnormal appearance of the bowel with multifocal bowel wall thick in. There is probable pneumatosis involving small bowel in the lower abdomen. No definite perforation. There is increased mesenteric edema and free fluid from prior exam. 2. Decreased hydronephrosis. 3. Splenomegaly. 4. Worsening airspace disease in the lower lungs, left greater than right.  These results of pneumatosis were called by telephone at the time of interpretation on 01/17/2015 at 3:38 am to Dr. Craige Cotta, who verbally acknowledged these results.   Electronically Signed   By: Rubye Oaks M.D.   On: 01/17/2015 03:39   Dg Chest Port 1 View  01/18/2015   CLINICAL DATA:  39 year old male with sepsis and ARDS. Initial encounter.  EXAM: PORTABLE CHEST - 1 VIEW  COMPARISON:  01/17/2015 and earlier.  FINDINGS: Portable AP semi upright view at 0543 hrs. Endotracheal tube tip is slightly lower, within normal limits. Stable left IJ central line. Stable visible enteric tube. Stable right IJ dual lumen catheter.  Upper lobe predominant hazy and confluent pulmonary opacity. Stable lung volumes. Stable cardiac size and mediastinal contours. No pneumothorax. No pleural effusion identified. Overall ventilation has not significantly changed since 01/16/2015.  IMPRESSION: 1.  Stable lines and tubes. 2.  Stable ventilation with upper lobe predominant hazy and confluent opacity.   Electronically Signed   By: Augusto Gamble M.D.   On: 01/18/2015 07:10   Dg Chest Port 1 View  01/17/2015  CLINICAL DATA:  Increased density at diffuse airspace disease, likely from  EXAM: PORTABLE CHEST - 1 VIEW  COMPARISON:  Chest x-ray from earlier today  FINDINGS: Stable positioning of endotracheal and orogastric tubes. There is a new left IJ central line, tip at the upper SVC. No complicating pneumothorax. The right upper extremity PICC is in stable position, tip at the upper cavoatrial junction. Dialysis or pheresis catheter via right IJ approach is in stable position.  Lower lung volumes, which likely accounts for the diffuse increased density of bilateral airspace disease, asymmetric to the left. No increasing pleural fluid.  Stable, normal heart size.  IMPRESSION: 1. New left IJ catheter is in good position.  No pneumothorax. 2. Diffuse pneumonia and/or noncardiogenic edema. Interval increase in density is likely from lower lung volumes.   Electronically Signed   By: Tiburcio Pea M.D.   On: 01/17/2015 11:40   Dg Chest Port 1 View  01/17/2015   CLINICAL DATA:  History of aspiration pneumonia, septic shock. , hepatic cirrhosis, respiratory failure.  EXAM: PORTABLE CHEST - 1 VIEW  COMPARISON:  Portable chest x-ray of January 17, 2015 at 1:24 a.m.  FINDINGS: There is persistent confluent bilateral airspace disease not greatly changed from the earlier study. There is relative sparing of the lung bases. The cardiopericardial silhouette is top-normal in size. The pulmonary vascularity is prominent centrally but stable. The endotracheal tube tip lies 4.3 cm above the crotch of the carina. The esophagogastric tube tip projects below the inferior margin of the image. The large caliber right internal jugular catheter tip projects over the proximal portion of the SVC. The right-sided PICC line tip projects over the midportion of the SVC.   IMPRESSION: Stable appearance of the chest with confluent bilateral alveolar opacities consistent with pneumonia, edema, or other alveolar filling process. The support tubes and lines are in appropriate position radiographically.   Electronically Signed   By: David  [place]   On: 01/17/2015 07:58   Dg Chest Port 1 View  01/17/2015   CLINICAL DATA:  Hemodialysis catheter placement.  EXAM: PORTABLE CHEST - 1 VIEW  COMPARISON:  One day prior at 1854 hr  FINDINGS: AP view at 0124 hr: Right dialysis catheter in the mid -distal SVC. There is no pneumothorax. Right upper extremity PICC tip remains in the distal SVC. Endotracheal tube of the thoracic inlet. Enteric tube in place, tip and side port below the diaphragm. The heart size is normal. Bilateral airspace disease, left greater than right, slightly worsened from prior. Question blunting of left costophrenic angle.  IMPRESSION: 1. Tip of the dialysis catheter in the mid SVC.  No pneumothorax. 2. Worsening bilateral airspace disease, pneumonia versus edema.   Electronically Signed   By: Rubye Oaks M.D.   On: 01/17/2015 02:48   Dg Chest Port 1 View  01/16/2015   CLINICAL DATA:  Encounter for intubation  EXAM: PORTABLE CHEST - 1 VIEW  COMPARISON:  01/16/2015  FINDINGS: Endotracheal tube tip is at the clavicular heads. A gastric suction tube enters the stomach. Stable right upper extremity PICC, tip at the upper cavoatrial junction.  Heart size and aortic contours remain normal. Bilateral airspace disease, asymmetric to the left, is unchanged. No increasing pleural fluid or air leak.  IMPRESSION: 1. Stable positioning of tubes and central line. 2. Unchanged bilateral pneumonia and/or noncardiogenic edema.   Electronically Signed   By: Tiburcio Pea M.D.   On: 01/16/2015 19:14   Dg Chest Port 1 View  01/16/2015  CLINICAL DATA:  Respiratory failure  EXAM: PORTABLE CHEST - 1 VIEW  COMPARISON:  January 16, 2015 study obtained earlier in the day  FINDINGS:  Endotracheal tube tip is 12.3 cm above the carina. Nasogastric tube tip and side port are in the stomach. Central catheter tip is in the superior vena cava just superior to the cavoatrial junction. No pneumothorax. There is widespread interstitial and patchy alveolar edema bilaterally, essentially stable. Heart is mildly enlarged with pulmonary venous hypertension. No adenopathy.  IMPRESSION: Tube and catheter positions as described without pneumothorax. Note that the endotracheal tube tip is 12.3 cm above the carina. The appearance is consistent with congestive heart failure, likely with superimposed ARDS. The appearance of the lungs and cardiac silhouette is essentially stable compared to earlier in the day.  Critical Value/emergent results were called by telephone at the time of interpretation on 01/16/2015 at 5:30 pm to Armida Sans, RN, who verbally acknowledged these results.   Electronically Signed   By: Bretta Bang M.D.   On: 01/16/2015 17:30   Dg Abd Portable 1v  01/16/2015   CLINICAL DATA:  39 year old male with 1 day history of abdominal pain and distension  EXAM: PORTABLE ABDOMEN - 1 VIEW  COMPARISON:  Prior CT abdomen/pelvis 12/30/2014 ; prior acute abdominal series 113 2016  FINDINGS: Percutaneous gastrostomy tube projects over the right upper quadrant in similar position compared to prior imaging. Additionally, a nasogastric tube appears present in the more proximal aspect of the stomach. The bowel gas pattern is not obstructed on this single view. No large free air although this is a supine study. A femoral vascular catheter projects over the right hip.  IMPRESSION: 1. Nonobstructed bowel gas pattern. 2. Nasogastric tube and percutaneous gastrostomy tubes project over the stomach.   Electronically Signed   By: Malachy Moan M.D.   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 male with multiple medical problems cirrhosis Crohn's disease on TPN via PICC at home admitted with apparent healthcare associated pneumonia sepsis +/- intrabdominal infection now found to be fungemic  #1 Fungemia: likely candidemia.  --PER [facility] 2016 GUIDELINES I started him on echinocandin namely micafungin --We are repeating blood cultures today after he been on the echinocandin  --I'm happy that his PICC line is out ultimately he will need a catheter holiday from all of his access sites but clearly now is not the time. I would remove any catheter that is not needed.  He will need 2 weeks of therapy from the date of negative blood cultures obtained after he has had all access sites removed.  Ideally would need a funduscopic eye exam done as well by ophthalmology  #2 healthcare associated pneumonia: Currently on vancomycin and Zosyn and these are reasonable  #3 ? Pneumatosis: not felt to have perforation. Followed closely by [facility] and [facility]  #4 screening HIV and viral hepatitis negative  Dr. Drue Second will be covering this weekend and is  available for questions.    LOS: 3 days   Acey Lav 01/18/2015, 1:28 PM

## 2015-01-18 NOTE — Progress Notes (Signed)
[place] NOTE  Pharmacy Consult for TPN Indication: End stage Chron's/Chronic TPN as outpatient  Allergies  Allergen Reactions  . Remicade [Infliximab]     Dr Laural Golden states previous respiratory arrest not related to remicade. Dr Laural Golden states remicade not a drug related allergy. 07-07-2013 at 1025 rapid response called to PACU- Patient difficulty breathing after infusion of Remicade infusing. Do NOT Give Remicade!  . Fentanyl And Related Itching    Swelling, Redness  . Alfentanil Itching    Swelling, Redness  . Wellbutrin [Bupropion] Nausea And Vomiting  . Morphine And Related Hives    Patient Measurements: Height: 5\' 11"  (180.3 cm) Weight: 192 lb 3.9 oz (87.2 kg) IBW/kg (Calculated) : 75.3 Dry weight: 80.9 kg  Vital Signs: Temp: 98.9 F (37.2 C) (02/05 0714) Temp Source: Axillary (02/05 0714) BP: 96/55 mmHg (02/05 0730) Pulse Rate: 71 (02/05 0800) Intake/Output from previous day: 02/04 0701 - 02/05 0700 In: 4911.7 [I.V.:2671.7; NG/GT:20; IV Piggyback:600; TPN:1610] Out: 5654 [Urine:533; Drains:1170] Intake/Output from this shift: Total I/O In: -  Out: 138 [Other:138]  Labs:  Recent Labs  01/16/15 0442 01/16/15 1904 01/16/15 2340 01/17/15 0345 01/18/15 0400  WBC 9.1 31.1*  --   --  10.5  HGB 9.5* 10.3* 10.5*  --  8.4*  HCT 30.9* 33.2* 31.0*  --  25.6*  PLT 57* 120*  --   --  33*  INR  --   --   --  1.78*  --      Recent Labs  01/16/15 1904  01/17/15 0020  01/17/15 0345 01/17/15 0752 01/17/15 1545 01/18/15 0415  NA 133*  < >  --   --  134*  132* 132* 133* 132*  K 7.0*  < > 6.8*  --  5.5*  6.2* 5.1 4.3 4.1  CL 105  < >  --   --  108  103 94* 88* 93*  CO2 16*  < >  --   --  18*  16* 22 34* 34*  GLUCOSE 79  < >  --   --  131*  139* 160* 165* 275*  BUN 68*  < >  --   --  66*  74* 69* 60* 57*  CREATININE 2.10*  < >  --   --  1.99*  2.32* 2.12* 1.80* 1.66*  CALCIUM 7.0*  < >  --   --  5.8*  6.6* 6.5* 7.0* 6.6*  MG  --   --    --   --  1.9  1.7  --   --  2.2  PHOS  --   --   --   < > 6.3*  6.8* 6.1* 5.4* 3.8  PROT 5.6*  --  5.0*  --  4.9*  --   --   --   ALBUMIN 2.5*  --  2.4*  --  2.0*  2.2* 2.4* 2.3* 2.1*  AST 257*  --  280*  --  287*  --   --   --   ALT 108*  --  113*  --  115*  --   --   --   ALKPHOS 198*  --  140*  --  125*  --   --   --   BILITOT 4.9*  --  5.0*  --  4.8*  --   --   --   BILIDIR  --   --  3.3*  --   --   --   --   --  IBILI  --   --  1.7*  --   --   --   --   --   PREALBUMIN  --   --   --   --  <3.0*  --   --   --   TRIG 301*  --   --   --  268*  --   --   --   < > = values in this interval not displayed. Estimated Creatinine Clearance: 64.3 mL/min (by C-G formula based on Cr of 1.66).    Recent Labs  01/17/15 2320 01/18/15 0416 01/18/15 0710  GLUCAP 276* 272* 232*   Insulin Requirements in the past 24 hours:  32 units SSI  Current Nutrition:  Clinimix 5/15 NO LYTES at 123mL/hr with multivitamin, but no trace elements. Only receiving IVFE at 44mL/hr on MWF- this provides an average of 144g protein and 2250kcal  TPN with [facility]-In 24 hours was receiving 2150 kcal, 115 g protein + lipids (TPN included electrolytes and trace elements)  Nutritional Goals:  2200-2300 kCal, 150 grams of protein per day, per RD recommendations 2/4  Assessment: 39 yo male with extensive PMH and multiple admissions. Has chronic, end-stage, Chron's disease and PUD and is being evaluated for small bowel transplant at [facility]. 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  Was admitted for pneumonia at [facility] in Jan 2016 and was admitted to [facility] in Nov 2015 for attempted ex-lap due to SBO.  Has been receiving TPN as outpatient.  GI: Not a surgical candidate due to abundance of scar tissue. Cirrhosis secondary to MTX vs NASH. Exlap was attempted during stay at [facility] in Nov 2015, but unable to gain access to abdomen for 2h and procedure aborted- had decompressive Gtube placed. Last  pre-albumin Jan 16: 13.9, currently <3.  NG output 285 ml. On ppi  Endo: cbgs 157-275- on SSI only right now; stress steroids-- hydrocortisone 50/6h; TSH wnl  Lytes: K now normal at 4.1, Corrected Ca 8, Phos 3.8 (Ca x phos = 30, Mg 2.2. NO LYTES in TPN bag   Renal: on CVVHDF- BFR 163mL/min, short interruption overnight, but has been running without noted issues. SCr 1.66, minimal urine. Now off bicarb gtt  Pulm: Vent- 50% FiO2  Cards: BP soft, HR 50-100s- On Levophed, Neo-synephrine, vasopressin with MAP in 70s, CVP 9  Hepatobil: from 2/4- AST 287, ALT 115, alk phos 125, Tbili 4.8. Trigs 268 2/4  Neuro: sedated on fent gtt, PRN Versed; GCS 6, RASS -3 (goal -3), CPOT 0  ID: Current fungemia/recurrent pneumonia, sepsis. Admission in Jan 2016 for pneumonia, also grew pan-sensitive pseduomonas at that time from G-tube site. WBC 31>10.5, afebrile, PCT 30.8, LA 4.7. Per ID recommendations- will need 2 weeks of therapy after negative cultures once all lines have been removed  2/2 blood cx x2: yeast  2/3 blood cx x2: NGTD 2/3 urine cx: sent 2/3 resp cx (TA): NGTD 2/3 mrsa: neg 2/4 resp cx: NGTD 2/4 blood cx x2: NGTD (1 has not yet been updated, but is designated as sent)  2/3 micafungin >> 2/2 vanc >> 2/2 zosyn >> 2/2 cefepime x1 2/2 clinda x1   TPN Access: CVC triple lumen left IJ - placed 2/4 TPN day#: continued from home  Plan:  -will defer electrolyte replacement to nephrologist- all are WNL at this time. -Continue Clinimix 5/15 WITHOUT electrolytes at 120 ml/hr + 20% lipids at 10 ml/hr on Mon/Wed/Fri, this will provide 144 g protein and a daily  average of 2250 kcal -IV multivitamin in TPN; Trace elements Mon/Wed/Fri only (half of normal provision) due to Tbili and patient being jaundiced  -continue CBGs and moderate SSI q4h as ordered by MD-- noted DM coordinator recommended starting IV insulin, so will hold off on adding any insulin to TPN bag -CMET, mag and phos in the morning  (noted renal panel collected q24 at 1400 as well)  Jaquayla Hege D. Tiant Peixoto, PharmD, BCPS Clinical Pharmacist Pager: [phone] 01/18/2015 8:45 AM

## 2015-01-18 NOTE — Progress Notes (Signed)
eLink Physician-Brief Progress Note Patient Name: RISHAD BOULDEN DOB: Feb 02, 1976 MRN: [id number]   Date of Service  01/18/2015  HPI/Events of Note  Sedation redcution prior, NOW severe dyschrony, with arrythmia concerns  eICU Interventions  STAT abg, pcxr, lactic, lytes If ph wnl and still dysrniochous with fen t elevation, then may need dose vec     Intervention Category Major Interventions: Change in mental status - evaluation and management  FEINSTEIN,DANIEL J. 01/18/2015, 4:59 PM

## 2015-01-19 ENCOUNTER — Inpatient Hospital Stay (HOSPITAL_COMMUNITY): Payer: Medicare Other

## 2015-01-19 DIAGNOSIS — J80 Acute respiratory distress syndrome: Secondary | ICD-10-CM

## 2015-01-19 LAB — COMPREHENSIVE METABOLIC PANEL
ALBUMIN: 2.1 g/dL — AB (ref 3.5–5.2)
ALT: 98 U/L — AB (ref 0–53)
ANION GAP: 4 — AB (ref 5–15)
AST: 122 U/L — AB (ref 0–37)
Alkaline Phosphatase: 130 U/L — ABNORMAL HIGH (ref 39–117)
BILIRUBIN TOTAL: 4.8 mg/dL — AB (ref 0.3–1.2)
BUN: 56 mg/dL — ABNORMAL HIGH (ref 6–23)
CO2: 28 mmol/L (ref 19–32)
CREATININE: 1.36 mg/dL — AB (ref 0.50–1.35)
Calcium: 6.7 mg/dL — ABNORMAL LOW (ref 8.4–10.5)
Chloride: 100 mmol/L (ref 96–112)
GFR, EST AFRICAN AMERICAN: 75 mL/min — AB (ref >90)
GFR, EST NON AFRICAN AMERICAN: 65 mL/min — AB (ref >90)
Glucose, Bld: 255 mg/dL — ABNORMAL HIGH (ref 70–99)
Potassium: 4.2 mmol/L (ref 3.5–5.1)
Sodium: 132 mmol/L — ABNORMAL LOW (ref 135–145)
TOTAL PROTEIN: 4.4 g/dL — AB (ref 6.0–8.3)

## 2015-01-19 LAB — CULTURE, RESPIRATORY: SPECIAL REQUESTS: NORMAL

## 2015-01-19 LAB — BASIC METABOLIC PANEL
ANION GAP: 6 (ref 5–15)
BUN: 55 mg/dL — ABNORMAL HIGH (ref 6–23)
CHLORIDE: 98 mmol/L (ref 96–112)
CO2: 26 mmol/L (ref 19–32)
Calcium: 6.4 mg/dL — CL (ref 8.4–10.5)
Creatinine, Ser: 1.36 mg/dL — ABNORMAL HIGH (ref 0.50–1.35)
GFR calc Af Amer: 75 mL/min — ABNORMAL LOW (ref >90)
GFR calc non Af Amer: 65 mL/min — ABNORMAL LOW (ref >90)
Glucose, Bld: 232 mg/dL — ABNORMAL HIGH (ref 70–99)
POTASSIUM: 4 mmol/L (ref 3.5–5.1)
SODIUM: 130 mmol/L — AB (ref 135–145)

## 2015-01-19 LAB — CBC
HEMATOCRIT: 24 % — AB (ref 39.0–52.0)
Hemoglobin: 7.6 g/dL — ABNORMAL LOW (ref 13.0–17.0)
MCH: 25.2 pg — ABNORMAL LOW (ref 26.0–34.0)
MCHC: 31.7 g/dL (ref 30.0–36.0)
MCV: 79.5 fL (ref 78.0–100.0)
Platelets: 34 10*3/uL — ABNORMAL LOW (ref 150–400)
RBC: 3.02 MIL/uL — AB (ref 4.22–5.81)
RDW: 19.6 % — ABNORMAL HIGH (ref 11.5–15.5)
WBC: 7.4 10*3/uL (ref 4.0–10.5)

## 2015-01-19 LAB — POCT I-STAT 3, ART BLOOD GAS (G3+)
ACID-BASE EXCESS: 1 mmol/L (ref 0.0–2.0)
BICARBONATE: 25.6 meq/L — AB (ref 20.0–24.0)
BICARBONATE: 26.3 meq/L — AB (ref 20.0–24.0)
Bicarbonate: 26.1 mEq/L — ABNORMAL HIGH (ref 20.0–24.0)
O2 SAT: 96 %
O2 Saturation: 98 %
O2 Saturation: 98 %
PCO2 ART: 41.1 mmHg (ref 35.0–45.0)
PCO2 ART: 44.7 mmHg (ref 35.0–45.0)
PH ART: 7.41 (ref 7.350–7.450)
PH ART: 7.364 (ref 7.350–7.450)
PO2 ART: 103 mmHg — AB (ref 80.0–100.0)
Patient temperature: 97.6
TCO2: 27 mmol/L (ref 0–100)
TCO2: 27 mmol/L (ref 0–100)
TCO2: 28 mmol/L (ref 0–100)
pCO2 arterial: 47.1 mmHg — ABNORMAL HIGH (ref 35.0–45.0)
pH, Arterial: 7.354 (ref 7.350–7.450)
pO2, Arterial: 81 mmHg (ref 80.0–100.0)
pO2, Arterial: 117 mmHg — ABNORMAL HIGH (ref 80.0–100.0)

## 2015-01-19 LAB — RENAL FUNCTION PANEL
ANION GAP: 6 (ref 5–15)
Albumin: 2.1 g/dL — ABNORMAL LOW (ref 3.5–5.2)
Albumin: 2.1 g/dL — ABNORMAL LOW (ref 3.5–5.2)
Anion gap: 5 (ref 5–15)
BUN: 54 mg/dL — ABNORMAL HIGH (ref 6–23)
BUN: 57 mg/dL — AB (ref 6–23)
CHLORIDE: 100 mmol/L (ref 96–112)
CO2: 27 mmol/L (ref 19–32)
CO2: 28 mmol/L (ref 19–32)
CREATININE: 1.28 mg/dL (ref 0.50–1.35)
Calcium: 6.9 mg/dL — ABNORMAL LOW (ref 8.4–10.5)
Calcium: 6.8 mg/dL — ABNORMAL LOW (ref 8.4–10.5)
Chloride: 98 mmol/L (ref 96–112)
Creatinine, Ser: 1.33 mg/dL (ref 0.50–1.35)
GFR calc Af Amer: 77 mL/min — ABNORMAL LOW (ref >90)
GFR calc non Af Amer: 66 mL/min — ABNORMAL LOW (ref >90)
GFR calc non Af Amer: 70 mL/min — ABNORMAL LOW (ref >90)
GFR, EST AFRICAN AMERICAN: 81 mL/min — AB (ref >90)
Glucose, Bld: 249 mg/dL — ABNORMAL HIGH (ref 70–99)
Glucose, Bld: 247 mg/dL — ABNORMAL HIGH (ref 70–99)
PHOSPHORUS: 2.5 mg/dL (ref 2.3–4.6)
Phosphorus: 2.2 mg/dL — ABNORMAL LOW (ref 2.3–4.6)
Potassium: 4.1 mmol/L (ref 3.5–5.1)
Potassium: 3.6 mmol/L (ref 3.5–5.1)
SODIUM: 131 mmol/L — AB (ref 135–145)
SODIUM: 133 mmol/L — AB (ref 135–145)

## 2015-01-19 LAB — GLUCOSE, CAPILLARY
Glucose-Capillary: 168 mg/dL — ABNORMAL HIGH (ref 70–99)
Glucose-Capillary: 200 mg/dL — ABNORMAL HIGH (ref 70–99)
Glucose-Capillary: 224 mg/dL — ABNORMAL HIGH (ref 70–99)
Glucose-Capillary: 229 mg/dL — ABNORMAL HIGH (ref 70–99)
Glucose-Capillary: 239 mg/dL — ABNORMAL HIGH (ref 70–99)

## 2015-01-19 LAB — TROPONIN I
TROPONIN I: 0.03 ng/mL (ref <0.031)
Troponin I: <0.03 ng/mL (ref <0.031)

## 2015-01-19 LAB — URINE CULTURE
Colony Count: NO GROWTH
Culture: NO GROWTH
SPECIAL REQUESTS: NORMAL

## 2015-01-19 LAB — CULTURE, RESPIRATORY W GRAM STAIN: Culture: NO GROWTH

## 2015-01-19 LAB — MAGNESIUM: Magnesium: 2.2 mg/dL (ref 1.5–2.5)

## 2015-01-19 LAB — PHOSPHORUS: Phosphorus: 2.1 mg/dL — ABNORMAL LOW (ref 2.3–4.6)

## 2015-01-19 MED ORDER — M.V.I. ADULT IV INJ
INTRAVENOUS | Status: AC
  Administered 2015-01-19: 18:00:00 via INTRAVENOUS
  Filled 2015-01-19: qty 2880

## 2015-01-19 MED ORDER — INSULIN ASPART 100 UNIT/ML ~~LOC~~ SOLN: 0.0000 [IU] | SUBCUTANEOUS | Status: DC

## 2015-01-19 MED ORDER — SODIUM CHLORIDE 0.9 % IV SOLN
1.0000 g | Freq: Once | INTRAVENOUS | Status: AC
  Administered 2015-01-19: 1 g via INTRAVENOUS
  Filled 2015-01-19: qty 10

## 2015-01-19 MED ORDER — INSULIN ASPART 100 UNIT/ML ~~LOC~~ SOLN
0.0000 [IU] | SUBCUTANEOUS | Status: DC
  Administered 2015-01-19: 4 [IU] via SUBCUTANEOUS
  Administered 2015-01-19: 7 [IU] via SUBCUTANEOUS
  Administered 2015-01-19: 4 [IU] via SUBCUTANEOUS
  Administered 2015-01-19: 7 [IU] via SUBCUTANEOUS
  Administered 2015-01-19 – 2015-01-20 (×2): 4 [IU] via SUBCUTANEOUS
  Administered 2015-01-20 (×2): 7 [IU] via SUBCUTANEOUS
  Administered 2015-01-20 – 2015-01-21 (×5): 4 [IU] via SUBCUTANEOUS
  Administered 2015-01-21: 7 [IU] via SUBCUTANEOUS
  Administered 2015-01-21: 4 [IU] via SUBCUTANEOUS
  Administered 2015-01-21: 7 [IU] via SUBCUTANEOUS
  Administered 2015-01-21: 4 [IU] via SUBCUTANEOUS
  Administered 2015-01-22 (×2): 3 [IU] via SUBCUTANEOUS
  Administered 2015-01-22 (×2): 4 [IU] via SUBCUTANEOUS
  Administered 2015-01-23: 3 [IU] via SUBCUTANEOUS
  Administered 2015-01-23: 4 [IU] via SUBCUTANEOUS
  Administered 2015-01-23 – 2015-01-24 (×2): 3 [IU] via SUBCUTANEOUS
  Administered 2015-01-24 (×2): 4 [IU] via SUBCUTANEOUS
  Administered 2015-01-24 – 2015-01-25 (×2): 3 [IU] via SUBCUTANEOUS
  Administered 2015-01-25: 4 [IU] via SUBCUTANEOUS
  Administered 2015-01-25 – 2015-01-26 (×4): 3 [IU] via SUBCUTANEOUS
  Administered 2015-01-26 – 2015-01-29 (×4): 4 [IU] via SUBCUTANEOUS
  Administered 2015-01-29: 3 [IU] via SUBCUTANEOUS

## 2015-01-19 MED ORDER — SODIUM PHOSPHATE 3 MMOLE/ML IV SOLN
10.0000 mmol | Freq: Once | INTRAVENOUS | Status: AC
  Administered 2015-01-19: 10 mmol via INTRAVENOUS
  Filled 2015-01-19: qty 3.33

## 2015-01-19 MED ORDER — FUROSEMIDE 10 MG/ML IJ SOLN
160.0000 mg | Freq: Once | INTRAVENOUS | Status: AC
  Administered 2015-01-19: 160 mg via INTRAVENOUS
  Filled 2015-01-19: qty 16

## 2015-01-19 MED ORDER — CHLORHEXIDINE GLUCONATE 0.12 % MT SOLN
15.0000 mL | Freq: Two times a day (BID) | OROMUCOSAL | Status: DC
  Administered 2015-01-19 – 2015-01-31 (×21): 15 mL via OROMUCOSAL
  Filled 2015-01-19 (×24): qty 15

## 2015-01-19 NOTE — Progress Notes (Signed)
[place] NOTE  Pharmacy Consult for TPN Indication: End stage Chron's/Chronic TPN as outpatient  Allergies  Allergen Reactions  . Remicade [Infliximab]     Dr Laural Golden states previous respiratory arrest not related to remicade. Dr Laural Golden states remicade not a drug related allergy. 07-07-2013 at 1025 rapid response called to PACU- Patient difficulty breathing after infusion of Remicade infusing. Do NOT Give Remicade!  . Fentanyl And Related Itching    Swelling, Redness  . Alfentanil Itching    Swelling, Redness  . Wellbutrin [Bupropion] Nausea And Vomiting  . Morphine And Related Hives    Patient Measurements: Height: 5\' 11"  (180.3 cm) Weight: 185 lb 10 oz (84.2 kg) IBW/kg (Calculated) : 75.3 Dry weight: 80.9 kg  Vital Signs: Temp: 97.9 F (36.6 C) (02/06 0740) Temp Source: Oral (02/06 0740) BP: 102/63 mmHg (02/06 0700) Pulse Rate: 78 (02/06 0700) Intake/Output from previous day: 02/05 0701 - 02/06 0700 In: 4630.4 [I.V.:960.4; NG/GT:60; IV Piggyback:600; TPN:2990] Out: 6889 [Urine:400; Emesis/NG output:350; Drains:1910] Intake/Output from this shift:    Labs:  Recent Labs  01/16/15 1904 01/16/15 2340 01/17/15 0345 01/18/15 0400 01/19/15 0408  WBC 31.1*  --   --  10.5 7.4  HGB 10.3* 10.5*  --  8.4* 7.6*  HCT 33.2* 31.0*  --  25.6* 24.0*  PLT 120*  --   --  33* 34*  INR  --   --  1.78*  --   --      Recent Labs  01/16/15 1904  01/17/15 0020  01/17/15 0345  01/18/15 0415 01/18/15 1540 01/18/15 1651 01/19/15 0015 01/19/15 0408  NA 133*  < >  --   --  134*  132*  < > 132* 132* 131* 130* 132*  K 7.0*  < > 6.8*  --  5.5*  6.2*  < > 4.1 4.1 4.1 4.0 4.2  CL 105  < >  --   --  108  103  < > 93* 97 98 98 100  CO2 16*  < >  --   --  18*  16*  < > 34* 30 28 26 28   GLUCOSE 79  < >  --   --  131*  139*  < > 275* 235* 227* 232* 255*  BUN 68*  < >  --   --  66*  74*  < > 57* 55* 56* 55* 56*  CREATININE 2.10*  < >  --   --  1.99*  2.32*  < >  1.66* 1.52* 1.58* 1.36* 1.36*  CALCIUM 7.0*  < >  --   --  5.8*  6.6*  < > 6.6* 6.7* 6.7* 6.4* 6.7*  MG  --   --   --   < > 1.9  1.7  --  2.2  --  2.2  --  2.2  PHOS  --   --   --   --  6.3*  6.8*  < > 3.8 2.6 2.6  --   --   PROT 5.6*  --  5.0*  --  4.9*  --   --   --   --   --  4.4*  ALBUMIN 2.5*  --  2.4*  --  2.0*  2.2*  < > 2.1* 2.0*  --   --  2.1*  AST 257*  --  280*  --  287*  --   --   --   --   --  122*  ALT 108*  --  113*  --  115*  --   --   --   --   --  98*  ALKPHOS 198*  --  140*  --  125*  --   --   --   --   --  130*  BILITOT 4.9*  --  5.0*  --  4.8*  --   --   --   --   --  4.8*  BILIDIR  --   --  3.3*  --   --   --   --   --   --   --   --   IBILI  --   --  1.7*  --   --   --   --   --   --   --   --   PREALBUMIN  --   --   --   --  <3.0*  --   --   --   --   --   --   TRIG 301*  --   --   --  268*  --   --   --   --   --   --   < > = values in this interval not displayed. Estimated Creatinine Clearance: 78.4 mL/min (by C-G formula based on Cr of 1.36).    Recent Labs  01/18/15 1950 01/18/15 2349 01/19/15 0409  GLUCAP 241* 224* 229*   Insulin Requirements in the past 24 hours:  25 units SSI  Current Nutrition:  Clinimix 5/15 NO LYTES at 127mL/hr. Only receiving IVFE at 38mL/hr on MWF- this provides an average of 144g protein and 2250kcal  TPN with [facility]-In 24 hours was receiving 2150 kcal, 115 g protein + lipids (TPN included electrolytes and trace elements)  Nutritional Goals:  2200-2300 kCal, 150 grams of protein per day, per RD recommendations 2/4  Assessment: 39 yo male with extensive PMH and multiple admissions. Has chronic, end-stage, Chron's disease and PUD and is being evaluated for small bowel [facility] at [facility]. 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  Was admitted for pneumonia at [facility] in Jan 2016 and was admitted to [facility] in Nov 2015 for attempted ex-lap due to SBO.  Has been receiving TPN as outpatient.  GI: Not a  surgical candidate due to abundance of scar tissue. Cirrhosis secondary to MTX vs NASH. Exlap was attempted during stay at [facility] in Nov 2015, but unable to gain access to abdomen for 2h and procedure aborted- had decompressive Gtube placed. Last pre-albumin Jan 16: 13.9, currently <3.  NG output 350 ml. On PPI  Endo: TSH wnl. cbgs 224-255- on SSI only right now; stress steroids-- hydrocortisone 50/6h; diabetes educator recommended advancement to stage 2 ICU hypergylcemia which is insulin gtt, therefore no insulin was added to TPN.   Lytes: NO LYTES IN TPN. Labs from this morning--Na low at 132, K nml 4.2, Corrected Ca 8.4- was repleted this morning by ELink with 1g calcium gluc, Phos 2.1 (Ca x phos = 17.6), Mg 2.2. Note getting labs in afternoons as well- lytes have been pretty stable, this is first low phos level, was elevated prior to starting CRRT   Renal: on CVVHDF- BFR 128mL/min, short interruption early this morning, but has been running without noted issues. SCr down to 1.36, minimal urine- attempting to give Lasix to stimulate production.  Pulm: Vent- 50% FiO2  Cards: BP soft, HR 70s- On Levophed, vasopressin with MAP in 80s, CVP 9  Hepatobil: AST/ALT decr to 122/98, alk phos 130, Tbili 4.8. Trigs 268 on 2/4. Alb remains low at 2.1  Neuro: sedated on fent gtt, PRN Versed; GCS 7, RASS -3 (goal -3), CPOT 0  ID: Current fungemia/recurrent pneumonia, sepsis. Admission in Jan 2016 for pneumonia, also grew pan-sensitive pseduomonas at that time from G-tube site. WBC 31>10.5>7.4, afebrile, PCT 30.8, LA 4.7. Per ID recommendations- will need 2 weeks of therapy after negative cultures once all lines have been removed  2/2 blood cx x2: yeast  2/3 blood cx x2: yeast 2/3 urine cx: sent 2/3 resp cx (TA): NGTD 2/3 mrsa: neg 2/4 resp cx: NGTD 2/4 urine: neg 2/4 blood cx x2: NGTD (1 has not yet been updated, but is designated as sent)  2/3 micafungin >> 2/2 vanc >> 2/2 zosyn >> 2/2 cefepime  x1 2/2 clinda x1   TPN Access: CVC triple lumen left IJ - placed 2/4 TPN day#: continued from home  Plan:  -discussed electrolytes with Dr. Mercy Moore- to give 70mmol of NaPhos now. Can add lytes back to TPN bag to be hung tonight  -Clinimix E 5/15 at 120 ml/hr + 20% lipids at 10 ml/hr on Mon/Wed/Fri, this will provide 144 g protein and a daily average of 2250 kcal -IV multivitamin in TPN; Trace elements Mon/Wed/Fri only (half of normal provision) due to Tbili and patient being jaundiced  -continue CBGs, increase SSI to resistant as CBGs continue to run high. Noted DM coordinator recommended starting IV insulin, so will hold off on adding any insulin to TPN bag -renal panel and mag in the morning (noted renal panel collected q24 at 1400 as well)  Omarian Jaquith D. Nilaya Bouie, PharmD, BCPS Clinical Pharmacist Pager: [phone] 01/19/2015 7:41 AM

## 2015-01-19 NOTE — Progress Notes (Signed)
eLink Physician-Brief Progress Note Patient Name: David [name] DOB: [birth year] MRN: [id number]   Date of Service  01/19/2015  HPI/Events of Note  Call from nurse reporting episodes of bradycardia to the 40s in patient vented on sedation with fentanyl.  Is on CRRT along with pressors NE/Vaso.  Issues with adequate sedation but have reduced the fentanyl dose.  eICU Interventions  Assessment/Plan Patient is young and this could just be physiologic however fentanyl can cause bradycardia but this dose has been reduced.  Also on CRRT so will check BMET to evaluate K level.  Will cycle trop.     Intervention Category Intermediate Interventions: Arrhythmia - evaluation and management  Rian Busche 01/19/2015, 12:08 AM

## 2015-01-19 NOTE — Progress Notes (Signed)
CRITICAL VALUE ALERT  Critical value received:  Calcium  Date of notification:  01/19/15  Time of notification:  0119  Critical value read back:Yes.    Nurse who received alert:  Okey Dupre, RN  MD notified:  Dr. Bea Laura Deterding  Time MD responded:  0120  Orders received. Will continue to monitor closely.

## 2015-01-19 NOTE — Progress Notes (Signed)
eLink Physician-Brief Progress Note Patient Name: David [name] DOB: 04-15-1976 MRN: [id number]   Date of Service  01/19/2015  HPI/Events of Note  Hypocalcemia  eICU Interventions  Calcium replaced     Intervention Category Intermediate Interventions: Electrolyte abnormality - evaluation and management  DETERDING,ELIZABETH 01/19/2015, 1:22 AM

## 2015-01-19 NOTE — Progress Notes (Signed)
PULMONARY / CRITICAL CARE MEDICINE   Name: David [name] MRN: [id number] DOB: December 20, 1975    ADMISSION DATE:  01/15/2015 CONSULTATION DATE:  01/19/2015  REFERRING MD :  [facility]  CHIEF COMPLAINT:  Dyspnea  INITIAL PRESENTATION:  39 y.o. M who presented to [facility] ED on 2/2 with 2 days of dyspnea, cough, fevers.  He was found to be hypoxic requiring NRB and CXR suggestive of HCAP/ARDS/ fungemia Maintained on TNA via RUE PICC. Of note, he has Crohn's of the small and large bowel and is awaiting small bowel transplantation at [facility].  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   STUDIES:  CXR 2/3 >>> progression of b/l opacities worrisome for ARDS.   SIGNIFICANT EVENTS: 2/2 - admitted to [facility] 2/3 - intubated, transferred to [facility] ICU.  Started on ARDS protocol  SUBJECTIVE:  Dyssynchronous with vent leading to increased sedation  VITAL SIGNS: Temp:  [94.9 F (34.9 C)-98.4 F (36.9 C)] 97.6 F (36.4 C) (02/06 1148) Pulse Rate:  [49-96] 67 (02/06 1100) Resp:  [16-40] 21 (02/06 1100) BP: (84-135)/(51-75) 92/54 mmHg (02/06 1100) SpO2:  [91 %-100 %] 99 % (02/06 1100) Arterial Line BP: (87-163)/(49-81) 125/69 mmHg (02/06 1100) FiO2 (%):  [40 %] 40 % (02/06 1100) Weight:  [84.2 kg (185 lb 10 oz)] 84.2 kg (185 lb 10 oz) (02/06 0500) HEMODYNAMICS: CVP:  [9 mmHg-12 mmHg] 11 mmHg VENTILATOR SETTINGS: Vent Mode:  [-] PRVC FiO2 (%):  [40 %] 40 % Set Rate:  [25 bmp] 25 bmp Vt Set:  [520 mL] 520 mL PEEP:  [5 cmH20] 5 cmH20 Plateau Pressure:  [21 cmH20-29 cmH20] 27 cmH20 INTAKE / OUTPUT: Intake/Output      02/05 0701 - 02/06 0700 02/06 0701 - 02/07 0700   I.V. (mL/kg) 960.4 (11.4) 143.2 (1.7)   Other 20    NG/GT 60    IV Piggyback 600 319.3   TPN 2990 520   Total Intake(mL/kg) 4630.4 (55) 982.5 (11.7)   Urine (mL/kg/hr) 400 (0.2) 190 (0.4)   Emesis/NG output 350 (0.2)  50 (0.1)   Drains 1910 (0.9) 275 (0.6)   Other 4229 (2.1) 1108 (2.3)   Stool 0 (0)    Total Output 6889 1623   Net -2258.6 -640.5        Stool Occurrence 1 x      PHYSICAL EXAMINATION: General: Young, chronically ill appearing male, in NAD. Neuro: Sedated on vent. HEENT: [place]/AT. PERRL, sclerae anicteric.  ETT in place. Cardiovascular: Tachy, regular, no M/R/G.  Lungs: Respirations even and unlabored.  Coarse BS bilaterally. Abdomen: Old abdominal incisions noted.  PEG in place.  BS hypoactive, soft, NT/ND.  Musculoskeletal: No gross deformities, 1+ pitting edema / anasarca.  LUE PICC. Skin: Intact, warm, no rashes.  LABS:  CBC  Recent Labs Lab 01/16/15 1904 01/16/15 2340 01/18/15 0400 01/19/15 0408  WBC 31.1*  --  10.5 7.4  HGB 10.3* 10.5* 8.4* 7.6*  HCT 33.2* 31.0* 25.6* 24.0*  PLT 120*  --  33* 34*   Coag's  Recent Labs Lab 01/17/15 0345  INR 1.78*   BMET  Recent Labs Lab 01/18/15 1651 01/19/15 0015 01/19/15 0408  NA 131* 130* 132*  K 4.1 4.0 4.2  CL 98 98 100  CO2 BUN 56* 55* 56*  CREATININE 1.58* 1.36* 1.36*  GLUCOSE 227* 232* 255*   Electrolytes  Recent Labs Lab 01/18/15 0415 01/18/15 1540 01/18/15 1651 01/19/15 0015 01/19/15 0408 01/19/15 0609  CALCIUM 6.6* 6.7* 6.7* 6.4* 6.7*  --   MG 2.2  --  2.2  --  2.2  --   PHOS 3.8 2.6 2.6  --   --  2.1*   Sepsis Markers  Recent Labs Lab 01/15/15 1521 01/16/15 1904 01/18/15 1704  LATICACIDVEN 2.72* 4.7* 1.91  PROCALCITON  --  30.89  --    ABG  Recent Labs Lab 01/18/15 0418 01/18/15 1654 01/19/15 0411  PHART 7.411 7.402 7.410  PCO2ART 49.5* 44.3 41.1  PO2ART 89.0 72.0* 81.0   Liver Enzymes  Recent Labs Lab 01/17/15 0020 01/17/15 0345  01/18/15 0415 01/18/15 1540 01/19/15 0408  AST 280* 287*  --   --   --  122*  ALT 113* 115*  --   --   --  98*  ALKPHOS 140* 125*  --   --   --  130*  BILITOT 5.0* 4.8*  --   --   --  4.8*  ALBUMIN 2.4* 2.0*  2.2*  < > 2.1*  2.0* 2.1*  < > = values in this interval not displayed. Cardiac Enzymes  Recent Labs Lab 01/16/15 1904 01/19/15 0015 01/19/15 0408  TROPONINI 0.07* 0.03 <0.03   Glucose  Recent Labs Lab 01/18/15 1113 01/18/15 1513 01/18/15 1950 01/18/15 2349 01/19/15 0409 01/19/15 0737  GLUCAP 222* 225* 241* 224* 229* 168*    CXR: ARDS pattern with relative sparing of RLL   ASSESSMENT / PLAN:  PULMONARY OETT 2/3 >>> A: Acute hypoxic respiratory failure ARDS HCAP Ventilator dyssynchrony P:   Cont full vent support - settings reviewed and/or adjusted Cont vent bundle Daily SBT if/when meets criteria Changed tp PCV mode 2/06 for improved synchrony  CARDIOVASCULAR RUE PICC  A:  Septic Shock P:  Goal MAP > 65. Levophed gtt, added vaso &  neo off Stress steroids. Hold outpatient metolazone, spironolactone.  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 Hepatic encephalopathy Chronic PEG tube - eats occasionally  Protein calorie malnutrition P:   Being followed at [facility] for possible small bowel transplant. Routine PEG tube care / maintenance. SUP: Pantoprazole. Cont TPN per pharmacy. Hold outpatient prednisone, bentyl, probiotics, phernergan, sucralfate, miralax.  HEMATOLOGIC A:   Anemia of chronic disease Thrombocytopenia - appears chronic VTE Prophylaxis P:  Transfuse per usual ICU guidelines. Monitor platelets. SCD's only. CBC in AM. Hold outpatient ferrous gluconate.  INFECTIOUS A:   Septic Shock -  suspect due to HCAP; however, consider GI process given prior GI hx P:   BCx2 2/2 >>>yeast >> UCx 2/2 >>> Sputum Cx 2/2 >>> Abx: Vanc, start date 2/2 Abx: Zosyn, start date 2/2, micafungin 2/3 >> ICU procalcitonin algorithm to limit abx exposure. ? Dc abx - defer to ID  ENDOCRINE A:   At risk relative AI given that chronic steroids hyperglycemia P:   Stress steroids. ICU hyperglycemia protocol.  NEUROLOGIC A:   Acute metabolic encephalopathy Chronic Pain P:   Sedation:   Use Midazolam prn & fent gtt (itching NOT an allergy) RASS goal: -  2. Daily WUA. Hold outpatient oxycodone.   Family updated: wife 2/4  Interdisciplinary Family Meeting v Palliative Care Meeting:  Due by: 2/10.   CC time x  30 mins   Billy Fischer, MD ; [facility] [phone].  After 5:30 PM or weekends, call [phone]   01/19/2015, 12:46 PM

## 2015-01-19 NOTE — Progress Notes (Signed)
CVVHD Management.  Metabolically stable though no PO4 done this AM?  Will Add PO4.  Still on 2 pressors but doses are lower.  Some UO yest, will give dose of lasix to see if can stimulate more UO.  Renal fx should recover as he recovers.

## 2015-01-19 NOTE — Progress Notes (Signed)
Orthopedic Tech Progress Note Patient Details:  David [name] 1976-10-18 [id number]  Ortho Devices Type of Ortho Device: Ankle splint Ortho Device/Splint Interventions: Application   Haskell Flirt 01/19/2015, 7:47 AM

## 2015-01-19 NOTE — Progress Notes (Signed)
eLink Physician-Brief Progress Note Patient Name: David [name] DOB: Feb 10, 1976 MRN: [id number]   Date of Service  01/19/2015  HPI/Events of Note  Asked to take a look at patient for vent asynchrony.  Patient with pneumonia and ARDS.  Patient on PC 40% with Peep 5 rr 20 and PC 15.  Patient appeared air hungry with forceful exhalation.  Last abg with adequate oxygenation and ventilation.  eICU Interventions  RR decreased to 15 to allow long exhalation time and PC increased to 18 to increased TV.  Repeat ABG at 1800     Intervention Category Intermediate Interventions: Other:  David [name], David [name], P 01/19/2015, 4:06 PM

## 2015-01-20 ENCOUNTER — Inpatient Hospital Stay (HOSPITAL_COMMUNITY): Payer: Medicare Other

## 2015-01-20 LAB — GLUCOSE, CAPILLARY
GLUCOSE-CAPILLARY: 189 mg/dL — AB (ref 70–99)
GLUCOSE-CAPILLARY: 191 mg/dL — AB (ref 70–99)
GLUCOSE-CAPILLARY: 249 mg/dL — AB (ref 70–99)
Glucose-Capillary: 206 mg/dL — ABNORMAL HIGH (ref 70–99)
Glucose-Capillary: 199 mg/dL — ABNORMAL HIGH (ref 70–99)

## 2015-01-20 LAB — CBC
HEMATOCRIT: 23.6 % — AB (ref 39.0–52.0)
Hemoglobin: 7.6 g/dL — ABNORMAL LOW (ref 13.0–17.0)
MCH: 25.2 pg — ABNORMAL LOW (ref 26.0–34.0)
MCHC: 32.2 g/dL (ref 30.0–36.0)
MCV: 78.1 fL (ref 78.0–100.0)
PLATELETS: 23 10*3/uL — AB (ref 150–400)
RBC: 3.02 MIL/uL — ABNORMAL LOW (ref 4.22–5.81)
RDW: 19.5 % — AB (ref 11.5–15.5)
WBC: 6 10*3/uL (ref 4.0–10.5)

## 2015-01-20 LAB — CULTURE, RESPIRATORY

## 2015-01-20 LAB — POCT I-STAT 3, ART BLOOD GAS (G3+)
Acid-Base Excess: 1 mmol/L (ref 0.0–2.0)
Bicarbonate: 25.8 mEq/L — ABNORMAL HIGH (ref 20.0–24.0)
O2 Saturation: 99 %
Patient temperature: 97.5
TCO2: 27 mmol/L (ref 0–100)
pCO2 arterial: 42.3 mmHg (ref 35.0–45.0)
pH, Arterial: 7.39 (ref 7.350–7.450)
pO2, Arterial: 153 mmHg — ABNORMAL HIGH (ref 80.0–100.0)

## 2015-01-20 LAB — RENAL FUNCTION PANEL
ALBUMIN: 2 g/dL — AB (ref 3.5–5.2)
ANION GAP: 5 (ref 5–15)
Albumin: 2.1 g/dL — ABNORMAL LOW (ref 3.5–5.2)
Anion gap: 3 — ABNORMAL LOW (ref 5–15)
BUN: 51 mg/dL — ABNORMAL HIGH (ref 6–23)
BUN: 50 mg/dL — AB (ref 6–23)
CALCIUM: 6.8 mg/dL — AB (ref 8.4–10.5)
CHLORIDE: 100 mmol/L (ref 96–112)
CO2: 30 mmol/L (ref 19–32)
CO2: 27 mmol/L (ref 19–32)
Calcium: 6.9 mg/dL — ABNORMAL LOW (ref 8.4–10.5)
Chloride: 101 mmol/L (ref 96–112)
Creatinine, Ser: 1.08 mg/dL (ref 0.50–1.35)
Creatinine, Ser: 1.11 mg/dL (ref 0.50–1.35)
GFR calc Af Amer: >90 mL/min (ref >90)
GFR calc Af Amer: >90 mL/min (ref >90)
GFR calc non Af Amer: 83 mL/min — ABNORMAL LOW (ref >90)
GFR, EST NON AFRICAN AMERICAN: 86 mL/min — AB (ref >90)
GLUCOSE: 216 mg/dL — AB (ref 70–99)
Glucose, Bld: 230 mg/dL — ABNORMAL HIGH (ref 70–99)
POTASSIUM: 4 mmol/L (ref 3.5–5.1)
Phosphorus: 3 mg/dL (ref 2.3–4.6)
Phosphorus: 3.3 mg/dL (ref 2.3–4.6)
Potassium: 3.9 mmol/L (ref 3.5–5.1)
Sodium: 133 mmol/L — ABNORMAL LOW (ref 135–145)
Sodium: 133 mmol/L — ABNORMAL LOW (ref 135–145)

## 2015-01-20 LAB — VANCOMYCIN, TROUGH: Vancomycin Tr: 10 ug/mL (ref 10.0–20.0)

## 2015-01-20 LAB — PHOSPHORUS: Phosphorus: 2.9 mg/dL (ref 2.3–4.6)

## 2015-01-20 LAB — CULTURE, RESPIRATORY W GRAM STAIN

## 2015-01-20 LAB — MAGNESIUM: MAGNESIUM: 2.3 mg/dL (ref 1.5–2.5)

## 2015-01-20 MED ORDER — VECURONIUM BROMIDE 10 MG IV SOLR
INTRAVENOUS | Status: AC
  Filled 2015-01-20: qty 10

## 2015-01-20 MED ORDER — FUROSEMIDE 10 MG/ML IJ SOLN
160.0000 mg | Freq: Two times a day (BID) | INTRAVENOUS | Status: DC
  Administered 2015-01-20 – 2015-01-22 (×5): 160 mg via INTRAVENOUS
  Filled 2015-01-20 (×6): qty 16

## 2015-01-20 MED ORDER — ALBUTEROL SULFATE (2.5 MG/3ML) 0.083% IN NEBU
2.5000 mg | INHALATION_SOLUTION | RESPIRATORY_TRACT | Status: DC | PRN
  Administered 2015-01-20: 2.5 mg via RESPIRATORY_TRACT

## 2015-01-20 MED ORDER — HYDROCORTISONE NA SUCCINATE PF 100 MG IJ SOLR
50.0000 mg | Freq: Two times a day (BID) | INTRAMUSCULAR | Status: DC
  Administered 2015-01-21 – 2015-01-22 (×3): 50 mg via INTRAVENOUS
  Filled 2015-01-20 (×5): qty 1

## 2015-01-20 MED ORDER — IPRATROPIUM-ALBUTEROL 0.5-2.5 (3) MG/3ML IN SOLN
3.0000 mL | Freq: Four times a day (QID) | RESPIRATORY_TRACT | Status: DC
  Administered 2015-01-20 – 2015-01-22 (×8): 3 mL via RESPIRATORY_TRACT
  Filled 2015-01-20 (×8): qty 3

## 2015-01-20 MED ORDER — MIDAZOLAM HCL 5 MG/ML IJ SOLN: 0.0000 mg/h | INTRAMUSCULAR | Status: DC

## 2015-01-20 MED ORDER — DEXMEDETOMIDINE HCL IN NACL 400 MCG/100ML IV SOLN
0.4000 ug/kg/h | INTRAVENOUS | Status: DC
  Administered 2015-01-20 (×2): 0.6 ug/kg/h via INTRAVENOUS
  Filled 2015-01-20 (×2): qty 100

## 2015-01-20 MED ORDER — MIDAZOLAM BOLUS VIA INFUSION: 1.0000 mg | INTRAVENOUS | Status: DC | PRN

## 2015-01-20 MED ORDER — VANCOMYCIN HCL IN DEXTROSE 750-5 MG/150ML-% IV SOLN
750.0000 mg | Freq: Two times a day (BID) | INTRAVENOUS | Status: DC
  Administered 2015-01-20 – 2015-01-22 (×4): 750 mg via INTRAVENOUS
  Filled 2015-01-20 (×5): qty 150

## 2015-01-20 MED ORDER — M.V.I. ADULT IV INJ
INTRAVENOUS | Status: AC
  Administered 2015-01-20: 18:00:00 via INTRAVENOUS
  Filled 2015-01-20: qty 2880

## 2015-01-20 NOTE — Progress Notes (Signed)
PULMONARY / CRITICAL CARE MEDICINE   Name: David [name] MRN: [id number] DOB: 02-Dec-1976    ADMISSION DATE:  01/15/2015 CONSULTATION DATE:  01/20/2015  REFERRING MD :  [facility]  CHIEF COMPLAINT:  Dyspnea  INITIAL PRESENTATION:  39 y.o. M who presented to [facility] ED on 2/2 with 2 days of dyspnea, cough, fevers.  He was found to be hypoxic requiring NRB and CXR suggestive of HCAP/ARDS/ fungemia Maintained on TNA via RUE PICC. Of note, he has Crohn's of the small and large bowel and is awaiting small bowel transplantation at [facility].  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   STUDIES:  CXR 2/3 >>> progression of b/l opacities worrisome for ARDS.   SIGNIFICANT EVENTS: 2/02 - admitted to [facility] 20/3 - intubated, transferred to [facility] ICU.  Started on ARDS protocol 2/07 Ventilator dyssynchrony with appearance of proximal airway obstruction. Improved with suctioning. Off vasopressors. Agitation difficult to control.   SUBJECTIVE:  RASS -3. Dyssynchronous. Not F/C  VITAL SIGNS: Temp:  [97.1 F (36.2 C)-98.1 F (36.7 C)] 97.1 F (36.2 C) (02/07 1251) Pulse Rate:  [40-101] 72 (02/07 1300) Resp:  [11-29] 15 (02/07 1300) BP: (73-132)/(43-73) 73/43 mmHg (02/07 1300) SpO2:  [95 %-100 %] 99 % (02/07 1300) Arterial Line BP: (98-170)/(57-84) 98/57 mmHg (02/07 1300) FiO2 (%):  [40 %] 40 % (02/07 1300) Weight:  [80.3 kg (177 lb 0.5 oz)] 80.3 kg (177 lb 0.5 oz) (02/07 0500) HEMODYNAMICS: CVP:  [4 mmHg-17 mmHg] 4 mmHg VENTILATOR SETTINGS: Vent Mode:  [-] PCV FiO2 (%):  [40 %] 40 % Set Rate:  [15 bmp] 15 bmp PEEP:  [5 cmH20] 5 cmH20 Pressure Support:  [10 cmH20] 10 cmH20 Plateau Pressure:  [17 cmH20-21 cmH20] 17 cmH20 INTAKE / OUTPUT: Intake/Output      02/06 0701 - 02/07 0700 02/07 0701 - 02/08 0700   I.V. (mL/kg) 767.3 (9.6) 138.1 (1.7)   Other 90 30   NG/GT 90 30   IV  Piggyback 819.3 116   TPN 2980 720   Total Intake(mL/kg) 4746.6 (59.1) 1034.1 (12.9)   Urine (mL/kg/hr) 1560 (0.8) 355 (0.7)   Emesis/NG output 200 (0.1) 150 (0.3)   Drains 1855 (1) 460 (0.9)   Other 4235 (2.2) 1047 (2)   Stool     Total Output 7850 2012   Net -3103.4 -977.9          PHYSICAL EXAMINATION: General: intubated, sedated. Neuro: RASS -3, not F/C, MAEs HEENT: WNL Cardiovascular: tachy, reg, no M Lungs: rhonchi R>L Abdomen: soft, +BS, G tube site clean Ext: trace symmetric edema  LABS: I have reviewed all of today's lab results. Relevant abnormalities are discussed in the A/P section  CXR: Slightly improved ARDS pattern with relative sparing of RLL   ASSESSMENT / PLAN:  PULMONARY OETT 2/3 >>> A: Acute hypoxic respiratory failure ARDS HCAP Ventilator dyssynchrony P:   Cont full vent support - settings reviewed and/or adjusted Cont vent bundle Daily SBT if/when meets criteria Add BDs 2/07  CARDIOVASCULAR L IJ CVL 2/04 >>  R [place] HD cath 2/04 >>  A:  Septic Shock P:  MAP goal > 65 mmHg Cont vasopressors as needed for above goal  RENAL A:   AKI/ATN - now nonoliguric Hypervolemia  P:   Monitor BMET intermittently Monitor I/Os Correct electrolytes as indicated CRRT per renal Furosemide challenge 2/06- 2/07 per Renal  GASTROINTESTINAL A:   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 Hepatic encephalopathy Chronic PEG tube - eats occasionally  Protein calorie malnutrition P:   Being followed at [facility] for possible small bowel transplant. Routine PEG tube care / maintenance. SUP: Pantoprazole. Cont TPN per pharmacy. Hold outpatient prednisone, bentyl, probiotics, phernergan, sucralfate,  miralax.  HEMATOLOGIC A:   Anemia of chronic disease Thrombocytopenia - appears chronic VTE Prophylaxis P:  Transfuse per usual ICU guidelines. Monitor platelets. SCD's only. CBC in AM. Hold outpatient ferrous gluconate.  INFECTIOUS A:   Septic Shock - suspect due to HCAP; however, consider GI process given prior GI hx P:   BCx2 2/2 >>>yeast >> UCx 2/2 >>> Sputum Cx 2/2 >>> Abx: Vanc, start date 2/2 Abx: Zosyn, start date 2/2, micafungin 2/3 >> ICU procalcitonin algorithm to limit abx exposure. ? Dc abx - defer to ID  ENDOCRINE A:   Chronic steroids, presumed adrenal insuff hyperglycemia P:   Wean stress steroids. Cont SSI  NEUROLOGIC A:   Acute metabolic encephalopathy Chronic Pain P:   Cont fentanyl gtt Transition from midaz to precedex 2/07 RASS goal: -2. Daily WUA.   Family updated: wife 2/07   CC time x  40 mins   Billy Fischer, MD ; [facility] [phone].  After 5:30 PM or weekends, call [phone]   01/20/2015, 1:41 PM

## 2015-01-20 NOTE — Progress Notes (Signed)
Patient's sedation turned off to see if patient arouses, currently does not follow commands, withdraws from pain intermittently, pupils reactive and equal.  Will monitor patient

## 2015-01-20 NOTE — Progress Notes (Signed)
Wasted 10cc fentanyl and 1 vial of vec with Armida Sans RN

## 2015-01-20 NOTE — Progress Notes (Signed)
CVVHD management.  Now off pressors.  Metabolically stable.  Made some urine with lasix yest so will resume at BID.   Will try to pull more fluid with CVVHD as BP allows.  Cuff and art line do not correlate.

## 2015-01-20 NOTE — Progress Notes (Signed)
eLink Physician-Brief Progress Note Patient Name: David [name] DOB: 1976/12/08 MRN: [id number]   Date of Service  01/20/2015  HPI/Events of Note  Patient placed on precedex this afternoon and developed bradycardia.    eICU Interventions  Drip stopped and patient returned to NSR     Intervention Category Minor Interventions: Agitation / anxiety - evaluation and management  KEANON, BEVINS, P 01/20/2015, 9:17 PM

## 2015-01-20 NOTE — Progress Notes (Signed)
ANTIBIOTIC CONSULT NOTE   Pharmacy Consult for David [name], and Mycamine Indication: pneumonia and sepsis  Labs:  Recent Labs  01/18/15 0400  01/19/15 0408  01/19/15 1515 01/20/15 0400 01/20/15 0500 01/20/15 1600  WBC 10.5  --  7.4  --   --  6.0  --   --   HGB 8.4*  --  7.6*  --   --  7.6*  --   --   PLT 33*  --  34*  --   --  23*  --   --   CREATININE  --   < > 1.36*  < > 1.28  --  1.08 1.11  < > = values in this interval not displayed. Estimated Creatinine Clearance: 96.1 mL/min (by C-G formula based on Cr of 1.11).   Microbiology: Recent Results (from the past 720 hour(s))  Wound culture     Status: None   Collection Time: 12/24/14  2:30 PM  Result Value Ref Range Status   Specimen Description WOUND GASTROSTOMY TUBE SITE  Final   Special Requests NONE  Final   Gram Stain   Final    RARE WBC PRESENT, PREDOMINANTLY MONONUCLEAR NO SQUAMOUS EPITHELIAL CELLS SEEN NO ORGANISMS SEEN Performed at [facility]    Culture   Final    RARE PSEUDOMONAS AERUGINOSA Performed at [facility]    Report Status 12/28/2014 FINAL  Final   Organism ID, Bacteria PSEUDOMONAS AERUGINOSA  Final      Susceptibility   Pseudomonas aeruginosa - MIC*    CEFEPIME 8 SENSITIVE Sensitive     CEFTAZIDIME 4 SENSITIVE Sensitive     CIPROFLOXACIN <=0.25 SENSITIVE Sensitive     GENTAMICIN 8 INTERMEDIATE Intermediate     IMIPENEM 1 SENSITIVE Sensitive     PIP/TAZO 8 SENSITIVE Sensitive     TOBRAMYCIN <=1 SENSITIVE Sensitive     * RARE PSEUDOMONAS AERUGINOSA  Clostridium Difficile by PCR     Status: None   Collection Time: 12/26/14 10:16 PM  Result Value Ref Range Status   C difficile by pcr NEGATIVE NEGATIVE Final  Wound culture     Status: None   Collection Time: 12/31/14 12:54 AM  Result Value Ref Range Status   Specimen Description WOUND ABDOMEN  Final   Special Requests IMMUNE:COMPROMISED  Final   Gram Stain   Final    RARE WBC PRESENT, PREDOMINANTLY MONONUCLEAR NO  SQUAMOUS EPITHELIAL CELLS SEEN RARE GRAM POSITIVE COCCI IN PAIRS IN CLUSTERS FEW YEAST Performed at [facility]    Culture   Final    MULTIPLE ORGANISMS PRESENT, NONE PREDOMINANT Note: NO GROUP A STREP (S.PYOGENES) ISOLATED NO STAPHYLOCOCCUS AUREUS ISOLATED Performed at [facility]    Report Status 01/03/2015 FINAL  Final  Blood culture (routine x 2)     Status: None (Preliminary result)   Collection Time: 01/15/15  3:07 PM  Result Value Ref Range Status   Specimen Description BLOOD LEFT ANTECUBITAL  Final   Special Requests BOTTLES DRAWN AEROBIC AND ANAEROBIC [facility]  Final   Culture   Final    YEAST Note: Gram Stain Report Called to,Read Back By and Verified With: [facility] [place] 1803 01/16/15 BY Ginette Pitman Performed at [facility] Performed at [facility]    Report Status PENDING  Incomplete  Blood culture (routine x 2)     Status: None (Preliminary result)   Collection Time: 01/15/15  3:12 PM  Result Value Ref Range Status   Specimen Description BLOOD  LEFT ARM  Final   Special Requests BOTTLES DRAWN AEROBIC AND ANAEROBIC 8CC  Final   Culture   Final    YEAST Note: Gram Stain Report Called to,Read Back By and Verified With: Armida Sans 1803 01/16/15 BY Ginette Pitman Performed at [facility] Performed at [facility]    Report Status PENDING  Incomplete  MRSA PCR Screening     Status: None   Collection Time: 01/15/15  6:33 PM  Result Value Ref Range Status   MRSA by PCR NEGATIVE NEGATIVE Final    Comment:        The GeneXpert MRSA Assay (FDA approved for NASAL specimens only), is one component of a comprehensive MRSA colonization surveillance program. It is not intended to diagnose MRSA infection nor to guide or monitor treatment for MRSA infections.   Culture, respiratory (tracheal aspirate)     Status: None   Collection Time: 01/16/15  5:46 PM  Result Value Ref Range Status   Specimen Description TRACHEAL ASPIRATE  Final    Special Requests Normal  Final   Gram Stain   Final    FEW WBC PRESENT, PREDOMINANTLY PMN NO SQUAMOUS EPITHELIAL CELLS SEEN NO ORGANISMS SEEN Performed at [facility]    Culture   Final    NO GROWTH 2 DAYS Performed at [facility]    Report Status 01/19/2015 FINAL  Final  Culture, blood (routine x 2)     Status: None (Preliminary result)   Collection Time: 01/16/15  6:30 PM  Result Value Ref Range Status   Specimen Description BLOOD LEFT ARM  Final   Special Requests BOTTLES DRAWN AEROBIC AND ANAEROBIC 5CC  Final   Culture   Final    YEAST Note: Gram Stain Report Called to,Read Back By and Verified With: PHYLLIS MCKINNEY ON 2.5.2016 AT 7:30P BY WILEJ Performed at [facility]    Report Status PENDING  Incomplete  Culture, blood (routine x 2)     Status: None (Preliminary result)   Collection Time: 01/16/15  6:45 PM  Result Value Ref Range Status   Specimen Description BLOOD LEFT HAND  Final   Special Requests BOTTLES DRAWN AEROBIC AND ANAEROBIC 5CC  Final   Culture   Final    YEAST Note: Gram Stain Report Called to,Read Back By and Verified With: CHRISTINE MATHERLY 01/19/15 0455A [facility] Performed at [facility]    Report Status PENDING  Incomplete  Culture, respiratory (NON-Expectorated)     Status: None   Collection Time: 01/17/15  8:40 AM  Result Value Ref Range Status   Specimen Description TRACHEAL SITE  Final   Special Requests NONE  Final   Gram Stain   Final    FEW WBC PRESENT,BOTH PMN AND MONONUCLEAR RARE SQUAMOUS EPITHELIAL CELLS PRESENT NO ORGANISMS SEEN Performed at [facility]    Culture   Final    Non-Pathogenic Oropharyngeal-type Flora Isolated. Performed at [facility]    Report Status 01/20/2015 FINAL  Final  Culture, blood (routine x 2)     Status: None (Preliminary result)   Collection Time: 01/17/15  1:13 PM  Result Value Ref Range Status   Specimen Description BLOOD LEFT HAND  Final    Special Requests BOTTLES DRAWN AEROBIC ONLY 7.5CC  Final   Culture   Final           BLOOD CULTURE RECEIVED NO GROWTH TO DATE CULTURE WILL BE HELD FOR 5 DAYS BEFORE ISSUING A FINAL NEGATIVE REPORT  Performed at [facility]    Report Status PENDING  Incomplete  Culture, blood (routine x 2)     Status: None (Preliminary result)   Collection Time: 01/17/15  6:50 PM  Result Value Ref Range Status   Specimen Description BLOOD LEFT HAND  Final   Special Requests   Final    BOTTLES DRAWN AEROBIC AND ANAEROBIC 5CC AER,2CC ANA   Culture   Final    YEAST Note: Gram Stain Report Called to,Read Back By and Verified With: P PATEL RN 01/20/15 AT 1020 AM BY [facility] Performed at [facility]    Report Status PENDING  Incomplete  Urine culture     Status: None   Collection Time: 01/17/15  8:42 PM  Result Value Ref Range Status   Specimen Description URINE, CATHETERIZED  Final   Special Requests Normal  Final   Colony Count NO GROWTH Performed at [facility]   Final   Culture NO GROWTH Performed at [facility]   Final   Report Status 01/19/2015 FINAL  Final     Assessment: 39yo male tx'd from [facility] for further tx of sepsis/HCAP, started on IV ABX but now pt in renal failure and started on CRRT. Vanc trough 10 mcg/ml  Goal of Therapy:  Vancomycin trough level 15-20 mcg/ml  Plan:  Will change vancomycin to  IV Q12H and cont Zosyn at 3.375g IV Q6H over .  Mycamine remains appropriate at Q24H dosing.  Thanks for allowing pharmacy to be a part of this patient's care.  Talbert Cage, PharmD Clinical Pharmacist, [phone]   01/20/2015,5:49 PM

## 2015-01-20 NOTE — Progress Notes (Signed)
Patient's pain/sedation drips decreased to Fentanyl and 1mg  Versed to perform WUA

## 2015-01-20 NOTE — Progress Notes (Signed)
Since 1300, Cuff BP have been running much lower then femoral arterial line pressures.  Since admission, Fem A-line and cuff have not correlated well, Fern Aline pressures were used for monitoring and titration of drips since waveform is appropriate, pulsatile, and line has good blood return.    Palpable pulses on all extremities. Will monitor 

## 2015-01-20 NOTE — Progress Notes (Signed)
eLink Physician-Brief Progress Note Patient Name: David [name] DOB: 05/01/1976 MRN: [id number]   Date of Service  01/20/2015  HPI/Events of Note  Failed precedex, brady  eICU Interventions  Needs vresed order back     Intervention Category Minor Interventions: Agitation / anxiety - evaluation and management  Nelda Bucks. 01/20/2015, 11:34 PM

## 2015-01-20 NOTE — Progress Notes (Signed)
[place] NOTE  Pharmacy Consult for TPN Indication: End stage Chron's/Chronic TPN as outpatient  Allergies  Allergen Reactions  . Remicade [Infliximab]     Dr Laural Golden states previous respiratory arrest not related to remicade. Dr Laural Golden states remicade not a drug related allergy. 07-07-2013 at 1025 rapid response called to PACU- Patient difficulty breathing after infusion of Remicade infusing. Do NOT Give Remicade!  . Fentanyl And Related Itching    Swelling, Redness  . Alfentanil Itching    Swelling, Redness  . Wellbutrin [Bupropion] Nausea And Vomiting  . Morphine And Related Hives    Patient Measurements: Height: 5\' 11"  (180.3 cm) Weight: 177 lb 0.5 oz (80.3 kg) IBW/kg (Calculated) : 75.3 Dry weight: 80.9 kg  Vital Signs: Temp: 97.3 F (36.3 C) (02/07 0740) Temp Source: Oral (02/07 0740) BP: 106/63 mmHg (02/07 0700) Pulse Rate: 62 (02/07 0700) Intake/Output from previous day: 02/06 0701 - 02/07 0700 In: 4746.6 [I.V.:767.3; NG/GT:90; IV Piggyback:819.3; TPN:2980] Out: 7850 [Urine:1560; Emesis/NG output:200; Drains:1855] Intake/Output from this shift: Total I/O In: 9.3 [I.V.:9.3] Out: 197 [Other:197]  Labs:  Recent Labs  01/18/15 0400 01/19/15 0408 01/20/15 0400  WBC 10.5 7.4 6.0  HGB 8.4* 7.6* 7.6*  HCT 25.6* 24.0* 23.6*  PLT 33* 34* 23*     Recent Labs  01/18/15 1651  01/19/15 0408 01/19/15 0609 01/19/15 1515 01/20/15 0400 01/20/15 0500  NA 131*  < > 132* 131* 133*  --  133*  K 4.1  < > 4.2 4.1 3.6  --  3.9  CL 98  < > 100 98 100  --  100  CO2 28  < > 28 27 28   --  30  GLUCOSE 227*  < > 255* 249* 247*  --  230*  BUN 56*  < > 56* 54* 57*  --  51*  CREATININE 1.58*  < > 1.36* 1.33 1.28  --  1.08  CALCIUM 6.7*  < > 6.7* 6.9* 6.8*  --  6.8*  MG 2.2  --  2.2  --   --  2.3  --   PHOS 2.6  --   --  2.2*  2.1* 2.5 2.9 3.0  PROT  --   --  4.4*  --   --   --   --   ALBUMIN  --   --  2.1* 2.1* 2.1*  --  2.1*  AST  --   --  122*  --    --   --   --   ALT  --   --  98*  --   --   --   --   ALKPHOS  --   --  130*  --   --   --   --   BILITOT  --   --  4.8*  --   --   --   --   < > = values in this interval not displayed. Estimated Creatinine Clearance: 98.8 mL/min (by C-G formula based on Cr of 1.08).    Recent Labs  01/19/15 1146 01/19/15 1944 01/20/15 0344  GLUCAP 239* 200* 206*   Insulin Requirements in the past 24 hours:  26 units resistant SSI  Current Nutrition:  Clinimix E 5/15 at 12mL/hr. Only receiving IVFE at 15mL/hr on MWF- this provides an average of 144g protein and 2250kcal  TPN with [facility]-In 24 hours was receiving 2150 kcal, 115 g protein + lipids (TPN included electrolytes and trace elements)  Nutritional Goals:  2200-2300 kCal, 150 grams of protein per day, per RD recommendations 2/4  Assessment: 39 yo male with extensive PMH and multiple admissions. Has chronic, end-stage, Chron's disease and PUD and is being evaluated for small bowel transplant at [facility]. 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  Was admitted for pneumonia at [facility] in Jan 2016 and was admitted to [facility] in Nov 2015 for attempted ex-lap due to SBO.  Has been receiving TPN as outpatient.  GI: Not a surgical candidate due to abundance of scar tissue. Cirrhosis secondary to MTX vs NASH. Exlap was attempted during stay at [facility] in Nov 2015, but unable to gain access to abdomen for 2h and procedure aborted- had decompressive Gtube placed. Last pre-albumin Jan 16: 13.9, currently <3 indicating very poor nutritional status.  NG output 200 ml. On PPI  Endo: TSH wnl. cbgs 200-247- on SSI only right now, some improvement when increased to resistant SSI; stress steroids-- hydrocortisone 50/6h; diabetes educator recommended advancement to stage 2 ICU hypergylcemia which is insulin gtt, therefore no insulin was added to TPN, however this has not been done for a couple days and CBGs continue to be elevated   Lytes: this  morning- Na low at 133, K 3.9, Corrected Ca 8.2, Phos 3 (Ca x phos = 25), Mg 2.3 Note getting labs in afternoons as well- lytes have been pretty stable   Renal: on CVVHDF- BFR 152mL/min, short interruption early this morning, but has been running without noted issues. SCr down to 1.08,  Lasix challenged yesterday- UOP incr to 0.53mL/kg/hr- incr to $Remov'160mg'CpFPXU$  IV BID  Pulm: Vent- 40% FiO2  Cards: BP soft (cuff not correlating to art line), HR 60s-now off pressors with MAP of 80-90s  Hepatobil: AST/ALT decr to 122/98, alk phos 130, Tbili 4.8. Trigs 268 on 2/4. Alb remains low at 2.1  Neuro: sedated on fent + Versed gtts, PRN Versed; GCS 7, RASS -3 (goal -3), CPOT 0  ID: Current fungemia/recurrent pneumonia, sepsis. Admission in Jan 2016 for pneumonia, also grew pan-sensitive pseduomonas at that time from G-tube site. WBC 31>10.5>7.4, afebrile, PCT 30.8, LA 4.7. Per ID recommendations- will need 2 weeks of therapy after negative cultures once all lines have been removed  2/2 blood cx x2: yeast  2/3 blood cx x2: yeast 2/3 urine cx: sent 2/3 resp cx (TA): neg 2/3 mrsa: neg 2/4 resp cx: reincubated 2/4 urine: neg 2/4 blood cx x2: NGTD  2/3 micafungin >> 2/2 vanc >> 2/2 zosyn >> 2/2 cefepime x1 2/2 clinda x1   TPN Access: CVC triple lumen left IJ - placed 2/4 TPN day#: continued from home  Plan:  -continue Clinimix E 5/15 at 120 ml/hr + 20% lipids at 10 ml/hr on Mon/Wed/Fri, this will provide 144 g protein and a daily average of 2250 kcal -IV multivitamin in TPN; Trace elements Mon/Wed/Fri only (half of normal provision) due to Tbili >2 and patient being jaundiced  -continue CBGs and resistant SSI. Will add 15 units of regular insulin to TPN bag since CBGs continue to be elevated -full TPN panel in the morning as per protocol  Shatona Andujar D. Kellis Mcadam, PharmD, BCPS Clinical Pharmacist Pager: [phone] 01/20/2015 8:17 AM

## 2015-01-20 NOTE — Progress Notes (Deleted)
Since 1300, Cuff BP have been running much lower then femoral arterial line pressures.  Since admission, Fem A-line and cuff have not correlated well, Fern Aline pressures were used for monitoring and titration of drips since waveform is appropriate, pulsatile, and line has good blood return.    Palpable pulses on all extremities. Will monitor

## 2015-01-21 DIAGNOSIS — N179 Acute kidney failure, unspecified: Secondary | ICD-10-CM | POA: Diagnosis present

## 2015-01-21 DIAGNOSIS — R6520 Severe sepsis without septic shock: Secondary | ICD-10-CM

## 2015-01-21 DIAGNOSIS — A419 Sepsis, unspecified organism: Secondary | ICD-10-CM | POA: Diagnosis present

## 2015-01-21 DIAGNOSIS — J069 Acute upper respiratory infection, unspecified: Secondary | ICD-10-CM | POA: Diagnosis present

## 2015-01-21 DIAGNOSIS — B377 Candidal sepsis: Secondary | ICD-10-CM | POA: Diagnosis present

## 2015-01-21 DIAGNOSIS — J189 Pneumonia, unspecified organism: Secondary | ICD-10-CM | POA: Diagnosis present

## 2015-01-21 DIAGNOSIS — T80219A Unspecified infection due to central venous catheter, initial encounter: Secondary | ICD-10-CM | POA: Diagnosis present

## 2015-01-21 DIAGNOSIS — T827XXA Infection and inflammatory reaction due to other cardiac and vascular devices, implants and grafts, initial encounter: Secondary | ICD-10-CM | POA: Diagnosis present

## 2015-01-21 DIAGNOSIS — B379 Candidiasis, unspecified: Secondary | ICD-10-CM

## 2015-01-21 LAB — DIFFERENTIAL
Basophils Absolute: 0 10*3/uL (ref 0.0–0.1)
Basophils Relative: 0 % (ref 0–1)
Eosinophils Absolute: 0 10*3/uL (ref 0.0–0.7)
Eosinophils Relative: 0 % (ref 0–5)
Lymphocytes Relative: 12 % (ref 12–46)
Lymphs Abs: 1.1 10*3/uL (ref 0.7–4.0)
Monocytes Absolute: 0.8 10*3/uL (ref 0.1–1.0)
Monocytes Relative: 9 % (ref 3–12)
Neutro Abs: 7 10*3/uL (ref 1.7–7.7)
Neutrophils Relative %: 79 % — ABNORMAL HIGH (ref 43–77)

## 2015-01-21 LAB — CULTURE, BLOOD (ROUTINE X 2)

## 2015-01-21 LAB — GLUCOSE, CAPILLARY
GLUCOSE-CAPILLARY: 168 mg/dL — AB (ref 70–99)
Glucose-Capillary: 234 mg/dL — ABNORMAL HIGH (ref 70–99)

## 2015-01-21 LAB — POCT I-STAT 3, ART BLOOD GAS (G3+)
Bicarbonate: 25.1 mEq/L — ABNORMAL HIGH (ref 20.0–24.0)
O2 Saturation: 94 %
PO2 ART: 66 mmHg — AB (ref 80.0–100.0)
TCO2: 26 mmol/L (ref 0–100)
pCO2 arterial: 38.3 mmHg (ref 35.0–45.0)
pH, Arterial: 7.421 (ref 7.350–7.450)

## 2015-01-21 LAB — RENAL FUNCTION PANEL
Albumin: 2.3 g/dL — ABNORMAL LOW (ref 3.5–5.2)
Anion gap: 5 (ref 5–15)
BUN: 40 mg/dL — ABNORMAL HIGH (ref 6–23)
CO2: 28 mmol/L (ref 19–32)
CREATININE: 1 mg/dL (ref 0.50–1.35)
Calcium: 7.4 mg/dL — ABNORMAL LOW (ref 8.4–10.5)
Chloride: 101 mmol/L (ref 96–112)
GFR calc Af Amer: >90 mL/min (ref >90)
GFR calc non Af Amer: >90 mL/min (ref >90)
Glucose, Bld: 183 mg/dL — ABNORMAL HIGH (ref 70–99)
POTASSIUM: 3.9 mmol/L (ref 3.5–5.1)
Phosphorus: 2.9 mg/dL (ref 2.3–4.6)
SODIUM: 134 mmol/L — AB (ref 135–145)

## 2015-01-21 LAB — CBC
HCT: 26.3 % — ABNORMAL LOW (ref 39.0–52.0)
Hemoglobin: 8.4 g/dL — ABNORMAL LOW (ref 13.0–17.0)
MCH: 25.5 pg — ABNORMAL LOW (ref 26.0–34.0)
MCHC: 31.9 g/dL (ref 30.0–36.0)
MCV: 79.9 fL (ref 78.0–100.0)
Platelets: 26 10*3/uL — CL (ref 150–400)
RBC: 3.29 MIL/uL — ABNORMAL LOW (ref 4.22–5.81)
RDW: 19.5 % — ABNORMAL HIGH (ref 11.5–15.5)
WBC: 8.9 10*3/uL (ref 4.0–10.5)

## 2015-01-21 LAB — COMPREHENSIVE METABOLIC PANEL
ALBUMIN: 2.3 g/dL — AB (ref 3.5–5.2)
ALT: 75 U/L — AB (ref 0–53)
ANION GAP: 6 (ref 5–15)
AST: 60 U/L — AB (ref 0–37)
Alkaline Phosphatase: 178 U/L — ABNORMAL HIGH (ref 39–117)
BUN: 46 mg/dL — AB (ref 6–23)
CHLORIDE: 101 mmol/L (ref 96–112)
CO2: 28 mmol/L (ref 19–32)
Calcium: 7.3 mg/dL — ABNORMAL LOW (ref 8.4–10.5)
Creatinine, Ser: 1.06 mg/dL (ref 0.50–1.35)
GFR calc non Af Amer: 87 mL/min — ABNORMAL LOW (ref >90)
Glucose, Bld: 178 mg/dL — ABNORMAL HIGH (ref 70–99)
Potassium: 3.3 mmol/L — ABNORMAL LOW (ref 3.5–5.1)
SODIUM: 135 mmol/L (ref 135–145)
TOTAL PROTEIN: 5.3 g/dL — AB (ref 6.0–8.3)
Total Bilirubin: 5.8 mg/dL — ABNORMAL HIGH (ref 0.3–1.2)

## 2015-01-21 LAB — PREALBUMIN: PREALBUMIN: 11.5 mg/dL — AB (ref 17.0–34.0)

## 2015-01-21 LAB — MAGNESIUM: MAGNESIUM: 2.3 mg/dL (ref 1.5–2.5)

## 2015-01-21 LAB — TRIGLYCERIDES: Triglycerides: 85 mg/dL (ref <150)

## 2015-01-21 LAB — PHOSPHORUS: Phosphorus: 3.3 mg/dL (ref 2.3–4.6)

## 2015-01-21 MED ORDER — FENTANYL CITRATE 0.05 MG/ML IJ SOLN
12.5000 ug | INTRAMUSCULAR | Status: DC | PRN
  Administered 2015-01-22: 25 ug via INTRAVENOUS
  Filled 2015-01-21: qty 2

## 2015-01-21 MED ORDER — FAT EMULSION 20 % IV EMUL
240.0000 mL | INTRAVENOUS | Status: DC
Start: 2015-01-21 — End: 2015-01-22
  Administered 2015-01-21: 240 mL via INTRAVENOUS
  Filled 2015-01-21: qty 250

## 2015-01-21 MED ORDER — M.V.I. ADULT IV INJ
INJECTION | INTRAVENOUS | Status: DC
Start: 2015-01-21 — End: 2015-01-22
  Administered 2015-01-21: 18:00:00 via INTRAVENOUS
  Filled 2015-01-21: qty 2880

## 2015-01-21 MED ORDER — CLONAZEPAM 0.1 MG/ML ORAL SUSPENSION: 0.5000 mg | Freq: Once | ORAL | Status: DC

## 2015-01-21 MED ORDER — QUETIAPINE FUMARATE 50 MG PO TABS
50.0000 mg | ORAL_TABLET | Freq: Once | ORAL | Status: AC
  Administered 2015-01-21: 50 mg
  Filled 2015-01-21 (×2): qty 1

## 2015-01-21 MED ORDER — POTASSIUM CHLORIDE 10 MEQ/50ML IV SOLN
10.0000 meq | INTRAVENOUS | Status: AC
  Administered 2015-01-21 (×4): 10 meq via INTRAVENOUS
  Filled 2015-01-21 (×4): qty 50

## 2015-01-21 MED ORDER — POTASSIUM CHLORIDE 10 MEQ/50ML IV SOLN
10.0000 meq | INTRAVENOUS | Status: AC
  Administered 2015-01-21 (×2): 10 meq via INTRAVENOUS
  Filled 2015-01-21 (×2): qty 50

## 2015-01-21 MED ORDER — CLONAZEPAM 0.5 MG PO TABS
0.5000 mg | ORAL_TABLET | Freq: Once | ORAL | Status: AC
  Administered 2015-01-21: 0.5 mg
  Filled 2015-01-21: qty 1

## 2015-01-21 NOTE — Procedures (Signed)
Admit: 01/15/2015 LOS: 6  15M w/ IBD/CD, Cirrhosis, presents with ARDS, Fungemia, Septic Shock, and with AKI req CRRT.    Current CRRT Prescription: Start Date: 01/17/15 Catheter: R IJ BFR: 200 Pre Blood Pump: 600 DFR: 1500 Replacement Rate: 200 Goal UF: - hr Anticoagulation: none Clotting: none  S: Tolerating net negativ 124mL/hr Good UOP, responsive to lasix Still on low dose NE  O: 02/07 0701 - 02/08 0700 In: 4488.6 [I.V.:636.6; NG/GT:120; IV Piggyback:732; TPN:2880] Out: 7126 [Urine:1610; Emesis/NG output:400; Drains:1035]  Filed Weights   01/19/15 0500 01/20/15 0500 01/21/15 0500  Weight: 84.2 kg (185 lb 10 oz) 80.3 kg (177 lb 0.5 oz) 77.6 kg (171 lb 1.2 oz)     Recent Labs Lab 01/20/15 0500 01/20/15 1600 01/21/15 0349  NA 133* 133* 135  K 3.9 4.0 3.3*  CL 100 101 101  CO2 30 27 28   GLUCOSE 230* 216* 178*  BUN 51* 50* 46*  CREATININE 1.08 1.11 1.06  CALCIUM 6.8* 6.9* 7.3*  PHOS 3.0 3.3 3.3    Recent Labs Lab 01/15/15 1440  01/19/15 0408 01/20/15 0400 01/21/15 0349  WBC 10.4  < > 7.4 6.0 8.9  NEUTROABS 10.0*  --   --   --  7.0  HGB 9.0*  < > 7.6* 7.6* 8.4*  HCT 29.2*  < > 24.0* 23.6* 26.3*  MCV 82.5  < > 79.5 78.1 79.9  PLT 76*  < > 34* 23* 26*  < > = values in this interval not displayed.  Scheduled Meds: . antiseptic oral rinse  7 mL Mouth Rinse QID  . chlorhexidine  15 mL Mouth Rinse BID  . furosemide  160 mg Intravenous BID  . hydrocortisone sod succinate (SOLU-CORTEF) inj  50 mg Intravenous Q12H  . insulin aspart  0-20 Units Subcutaneous 6 times per day  . ipratropium-albuterol  3 mL Nebulization Q6H  . micafungin (MYCAMINE) IV  100 mg Intravenous QHS  . pantoprazole (PROTONIX) IV  40 mg Intravenous Q12H  . piperacillin-tazobactam  3.375 g Intravenous 4 times per day  . vancomycin  750 mg Intravenous Q12H   Continuous Infusions: . Marland KitchenTPN (CLINIMIX-E) Adult 120 mL/hr at 01/21/15 0400  . dexmedetomidine Stopped (01/20/15 2100)  . Marland KitchenTPN  (CLINIMIX-E) Adult     And  . fat emulsion    . fentaNYL infusion INTRAVENOUS 150 mcg/hr (01/21/15 0400)  . norepinephrine (LEVOPHED) Adult infusion 4 mcg/min (01/21/15 0400)  . dialysis replacement fluid (prismasate) 600 mL/hr at 01/21/15 0200  . dialysis replacement fluid (prismasate) 200 mL/hr at 01/20/15 1600  . dialysate (PRISMASATE) 1,500 mL/hr at 01/21/15 0751   PRN Meds:.sodium chloride, albuterol, fentaNYL, heparin, heparin, midazolam  ABG    Component Value Date/Time   PHART 7.390 01/20/2015 2026   PCO2ART 42.3 01/20/2015 2026   PO2ART 153.0* 01/20/2015 2026   HCO3 25.8* 01/20/2015 2026   TCO2 27 01/20/2015 2026   ACIDBASEDEF 1.0 01/17/2015 0723   O2SAT 99.0 01/20/2015 2026    A/P  1. Dialysis dependent AKI 1. Nl baseline GFR 2. Now that stable hemodynamics and metabolically will stop CRRT today when clotted or before shift change 3. Hopefully will not require iHD 4. Cont to follow labs, volume status 2. Hypervolemia 1. Cont BID lasix, goal at least 1L net negative/day 2. Allow UF until CRRT clots  3. Septic Shock 2/2 Fungemia 4. Fungemia on Micafungin, RCID/CCM following 5. IBD/Chrons 1. On Home TPN 2. Short Gut / Frozen 3. On list for SB tplt at [facility] 6. ESLD  7. ARDS per CCM  Sabra Heck, MD [facility] pgr [phone]

## 2015-01-21 NOTE — Progress Notes (Signed)
UR Completed.  [phone]  

## 2015-01-21 NOTE — Progress Notes (Addendum)
PARENTERAL NUTRITION CONSULT NOTE - FOLLOW UP  Pharmacy Consult:  TPN Indication:  End stage Crohn's/Chronic TPN as outpatient  Allergies  Allergen Reactions  . Remicade [Infliximab]     Dr Laural Golden states previous respiratory arrest not related to remicade. Dr Laural Golden states remicade not a drug related allergy. 07-07-2013 at 1025 rapid response called to PACU- Patient difficulty breathing after infusion of Remicade infusing. Do NOT Give Remicade!  . Fentanyl And Related Itching    Swelling, Redness  . Alfentanil Itching    Swelling, Redness  . Wellbutrin [Bupropion] Nausea And Vomiting  . Morphine And Related Hives    Patient Measurements: Height: 5\' 11"  (180.3 cm) Weight: 171 lb 1.2 oz (77.6 kg) IBW/kg (Calculated) : 75.3 Dry weight: 81 kg  Vital Signs: Temp: 97.4 F (36.3 C) (02/08 0400) Temp Source: Oral (02/08 0400) BP: 100/62 mmHg (02/08 0700) Pulse Rate: 53 (02/08 0700) Intake/Output from previous day: 02/07 0701 - 02/08 0700 In: 4488.6 [I.V.:636.6; NG/GT:120; IV Piggyback:732; TPN:2880] Out: 7126 [Urine:1610; Emesis/NG output:400; Drains:1035]  Labs:  Recent Labs  01/19/15 0408 01/20/15 0400 01/21/15 0349  WBC 7.4 6.0 8.9  HGB 7.6* 7.6* 8.4*  HCT 24.0* 23.6* 26.3*  PLT 34* 23* 26*     Recent Labs  01/19/15 0408  01/20/15 0400 01/20/15 0500 01/20/15 1600 01/21/15 0349  NA 132*  < >  --  133* 133* 135  K 4.2  < >  --  3.9 4.0 3.3*  CL 100  < >  --  100 101 101  CO2 28  < >  --  30 27 28   GLUCOSE 255*  < >  --  230* 216* 178*  BUN 56*  < >  --  51* 50* 46*  CREATININE 1.36*  < >  --  1.08 1.11 1.06  CALCIUM 6.7*  < >  --  6.8* 6.9* 7.3*  MG 2.2  --  2.3  --   --  2.3  PHOS  --   < > 2.9 3.0 3.3 3.3  PROT 4.4*  --   --   --   --  5.3*  ALBUMIN 2.1*  < >  --  2.1* 2.0* 2.3*  AST 122*  --   --   --   --  60*  ALT 98*  --   --   --   --  75*  ALKPHOS 130*  --   --   --   --  178*  BILITOT 4.8*  --   --   --   --  5.8*  TRIG  --   --   --   --   --   85  < > = values in this interval not displayed. Estimated Creatinine Clearance: 100.6 mL/min (by C-G formula based on Cr of 1.06).    Recent Labs  01/20/15 1943 01/21/15 0034 01/21/15 0326  GLUCAP 249* 234* 168*     Insulin Requirements in the past 24 hours:  26 units resistant SSI + 15 units regular insulin in TPN  Assessment: 70 YOM with an extensive PMH and multiple admissions.  He has chronic, end-stage, Crohn's disease and PUD and is being evaluated for small bowel transplant at [facility].  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  Patient was admitted to [facility] in November 2015 for attempted ex-lap due to SBO.  He has been receiving TPN as outpatient.  GI: Not a surgical candidate d/t abundance of scar tissue. Cirrhosis secondary to  MTX vs NASH. Ex-lap was attempted during stay at [facility] in Nov 2015, but unable to gain access to abdomen for 2hr and procedure aborted >> placed decompressive G-tube instead.  Prealbumin <3 indicating very poor nutritional status (was 13.9 in Jan).  NG O/P increased to 300 mL. On PPI IV Endo: TSH WNL.  DM - CBGs uncontrolled but appears to be improving with insulin added to TPN and steroid being tapered Lytes: getting TPN formula with lytes since 2/6 per discussion with Renal - K+ 3.3 (2 runs ordered), others WNL Renal: on CVVHDF - minimal interruptios. SCr down to 1.06 - continues on Lasix with good UOP 0.8 ml/kg/hr Pulm: intubated, FiO2 40% - Duonebs Cards: BP improved with resuming Levophed, some bradycardia, CVP 11 - Lasix (using A-line BP as more accurate) Hepatobil: AST/ALT decreased to 60/75, alk phos up to 178, tbili up to 5.8.  TG normalized (268 >> 85). Heme: hgb 8.4, plts down to 26 Neuro: Fentanyl + Versed gtts, PRN Versed (off Precedex d/t bradycardia) - GCS 11, CPOT 0, RASS -3 (goal -2) ID: Mycamine D#6 for fungemia (2/2 and 2/3 BCx grew yeast, 2/4 BCx NGTD) + Vanc/Zosyn D# 7 for recurrent PNA + sepsis. Admission in Jan 2016 for  PNA and grew pan-sensitive Pseduomonas from G-tube site. Need 2 wks of therapy after negative cultures once all lines have been removed.  Afebrile, WBC WNL TPN Access: CVC triple lumen left IJ - placed 2/4 TPN day#: continued from home  Current Nutrition:  Clinimix E 5/15 at 12mL/hr + 20% IVFE at 70mL/hr on MWF = 144g protein/d and weekly average of 2230 kCal/d  Home TPN:  [facility]:  2150 kCal, 115 g protein per day (TPN included electrolytes and trace elements)  Nutritional Goals:  2200-2300 kCal, 150 grams of protein per day   Plan:  - Continue Clinimix E 5/15 at 120 ml/hr + 20% lipids at 10 ml/hr on Mon/Wed/Fri, meeting 100% of minimal kCal and 96% of protein needs - Daily multivitamin - Trace elements MWF due to elevated tbili and documentation of jaundice - Continue resistant SSI + increase insulin in TPN to 30 units per bag - F/U AM labs, CBGs    Brittnye Josephs D. Mina Marble, PharmD, BCPS Pager:  [phone] 01/21/2015, 8:01 AM

## 2015-01-21 NOTE — Progress Notes (Signed)
Admit: 01/15/2015 LOS: 7  38M w/ IBD/CD, Cirrhosis, presents with ARDS, Fungemia, Septic Shock, and with AKI req CRRT.    Subjective:  Extubated yesterday Off CRRT Persistent Candida culture is glabrata and parapsilosis On micafungin R IJ HD Cath removed WOrkign with PT, beginning to ambulate     02/08 0701 - 02/09 0700 In: 4185.3 [I.V.:295.3; NG/GT:30; IV Piggyback:700; TPN:2880] Out: 7573 [Urine:3120; Emesis/NG output:100; Drains:2700]  Filed Weights   01/20/15 0500 01/21/15 0500 01/22/15 0500  Weight: 80.3 kg (177 lb 0.5 oz) 77.6 kg (171 lb 1.2 oz) 74.1 kg (163 lb 5.8 oz)    Scheduled Meds: . antiseptic oral rinse  7 mL Mouth Rinse QID  . chlorhexidine  15 mL Mouth Rinse BID  . furosemide  160 mg Intravenous BID  . hydrocortisone sod succinate (SOLU-CORTEF) inj  50 mg Intravenous Q12H  . insulin aspart  0-20 Units Subcutaneous 6 times per day  . ipratropium-albuterol  3 mL Nebulization Q6H  . micafungin (MYCAMINE) IV  100 mg Intravenous QHS  . pantoprazole (PROTONIX) IV  40 mg Intravenous Q12H  . piperacillin-tazobactam (ZOSYN)  IV  3.375 g Intravenous 3 times per day  . potassium chloride  10 mEq Intravenous Q1 Hr x 6  . vancomycin  750 mg Intravenous Q12H   Continuous Infusions: . Marland KitchenTPN (CLINIMIX-E) Adult 120 mL/hr at 01/22/15 0600   And  . fat emulsion 240 mL (01/22/15 0600)  . fentaNYL infusion INTRAVENOUS Stopped (01/21/15 1115)   PRN Meds:.sodium chloride, albuterol, fentaNYL, fentaNYL, heparin, heparin, midazolam  Current Labs: reviewed    Physical Exam:  Blood pressure 99/54, pulse 85, temperature 98.2 F (36.8 C), temperature source Oral, resp. rate 25, height 5\' 11"  (1.803 m), weight 74.1 kg (163 lb 5.8 oz), SpO2 98 %. Think frail appearing RRR CTAB, diminshed in bases No LEE Foley in place Nonfocal EOMI  A/P 1. Dialysis dependent AKI 1. Nl baseline GFR 2. CRRT stopped 01/21/15 3. Excellent UOP, follow labs -- might have recovered 4. No Vascular  access currently 2/2 candidemia 2. Hypervolemia, improving 1. Cont BID lasix, goal at least 1L net negative/day 3. Septic Shock 2/2 Fungemia 4. Fungemia (C glabrata) on Micafungin, RCID/CCM following 5. IBD/Chrons 1. On Home TPN 2. Short Gut / Frozen 3. On list for SB [facility]plt at [facility] 6. ESLD 7. ARDS per CCM 1. Extubated 01/21/15  Sabra Heck MD 01/22/2015, 8:28 AM   Recent Labs Lab 01/21/15 0349 01/21/15 1640 01/22/15 0420  NA 135 134* 136  K 3.3* 3.9 3.0*  CL 101 101 101  CO2 28 28 27   GLUCOSE 178* 183* 284*  BUN 46* 40* 63*  CREATININE 1.06 1.00 1.31  CALCIUM 7.3* 7.4* 7.7*  PHOS 3.3 2.9 4.8*    Recent Labs Lab 01/15/15 1440  01/20/15 0400 01/21/15 0349 01/22/15 0420  WBC 10.4  < > 6.0 8.9 6.4  NEUTROABS 10.0*  --   --  7.0  --   HGB 9.0*  < > 7.6* 8.4* 7.3*  HCT 29.2*  < > 23.6* 26.3* 22.4*  MCV 82.5  < > 78.1 79.9 78.0  PLT 76*  < > 23* 26* 33*  < > = values in this interval not displayed.

## 2015-01-21 NOTE — Progress Notes (Addendum)
PULMONARY / CRITICAL CARE MEDICINE   Name: David [name] MRN: [id number] DOB: 04-Aug-1976    ADMISSION DATE:  01/15/2015 CONSULTATION DATE:  01/21/2015  REFERRING MD :  [facility]  CHIEF COMPLAINT:  Dyspnea  INITIAL PRESENTATION:  39 y.o. M who presented to [facility] ED on 2/2 with 2 days of dyspnea, cough, fevers.  He was found to be hypoxic requiring NRB and CXR suggestive of HCAP/ARDS/ fungemia Maintained on TNA via RUE PICC. Of note, he has Crohn's of the small and large bowel and is awaiting small bowel transplantation at [facility].  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   MAJOR EVENTS/TEST RESULTS: 2/02 - admitted to [facility] 2/03 - intubated, transferred to [facility] ICU.  Started on ARDS protocol 2/03 Renal consult: CRRT initiated for mgmt of severe acidosis and hyperkalemia 2/03 Gen surg consult: CT abdomen ordered. Noted that pt was difficult surgery @ [facility] 2/04 CTAP: Diffuse abnormal appearance of the bowel with multifocal bowel wall thick in. There is probable pneumatosis involving small bowel in the lower abdomen. No definite perforation. There is increased mesenteric edema and free fluid from prior exam. Decreased hydronephrosis. Splenomegaly. Worsening airspace disease in the lower lungs, left greater than Right 2/04 ID consult for fungemia: Micafungin added 2/07 Ventilator dyssynchrony with appearance of proximal airway obstruction. Improved with suctioning. Off vasopressors. Agitation difficult to control.  2/08 Passed SBT. Remains agitated. Extubated and tolerating. Weaning vasopressors to off. Persistent fungemia on F/U BCs. ID service recommends echocardiogram and change of indwelling catheters/lines. Uo picking up with furosemide. CRRT continued.     INDWELLING DEVICES:: ETT 2/03 >> 2/08 R femoral A-line 2/03 >> 2/08 R IJ HD cath 2/04 >> 2/08 L IJ CVL 2/04 >>     MICRO DATA: MRSA PCR 2/02 >> NEG Urine 2/04 >> NEG Resp 2/03 >> NEG Blood 2/02 >> 2/2 yeast Blood 2/03 >> 2/2 candida glabrata Blood 2/04 >> 1/2 yeast Resp 2/04 >> NOF Blood 2/08 >>   ANTIMICROBIALS:  Clinda 2/02 >> 2/02 Cefepime 2/02 >> 2/02 Vanc 2/02 >>  Pip-tazo 2/02 >>  Micafungin 2/03 >>    SUBJECTIVE:  RASS -1.  + F/C. Passed SBT and tolerating.   VITAL SIGNS: Temp:  [97.1 F (36.2 C)-97.5 F (36.4 C)] 97.3 F (36.3 C) (02/08 1151) Pulse Rate:  [50-113] 113 (02/08 1400) Resp:  [11-28] 14 (02/08 1400) BP: (69-125)/(34-73) 116/70 mmHg (02/08 1400) SpO2:  [96 %-100 %] 100 % (02/08 1402) Arterial Line BP: (92-158)/(55-86) 127/66 mmHg (02/08 1400) FiO2 (%):  [40 %] 40 % (02/08 0800) Weight:  [77.6 kg (171 lb 1.2 oz)] 77.6 kg (171 lb 1.2 oz) (02/08 0500) HEMODYNAMICS: CVP:  [2 mmHg-13 mmHg] 13 mmHg VENTILATOR SETTINGS: Vent Mode:  [-] PCV FiO2 (%):  [40 %] 40 % Set Rate:  [15 bmp] 15 bmp PEEP:  [5 cmH20] 5 cmH20 Plateau Pressure:  [14 cmH20-19 cmH20] 14 cmH20 INTAKE / OUTPUT: Intake/Output      02/07 0701 - 02/08 0700 02/08 0701 - 02/09 0700   I.V. (mL/kg) 636.6 (8.2) 135.3 (1.7)   Other 120 100   NG/GT 120 30   IV Piggyback 732 150   TPN 2880 840   Total Intake(mL/kg) 4488.6 (57.8) 1255.3 (16.2)   Urine (mL/kg/hr) 1610 (0.9) 625 (1.1)   Emesis/NG output 400 (0.2) 100 (0.2)   Drains 1035 (0.6) 385 (0.7)  Other 4081 (2.2) 1327 (2.3)   Total Output 7126 2437   Net -2637.4 -1181.7          PHYSICAL EXAMINATION: General: RASS -1, CAM-ICU positive Neuro: no focal deficits HEENT: WNL Cardiovascular: Reg, no M Lungs: rhonchi improved Abdomen: Mildly distended, diminished BS, G tube site clean Ext: no edema  LABS: I have reviewed all of today's lab results. 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 Routine PEG tube care / maintenance. SUP: Pantoprazole. Cont TPN per pharmacy Monitor LFTs intermittently  HEMATOLOGIC A:   Anemia of chronic disease Thrombocytopenia  VTE Prophylaxis P:  DVT px: SCDs Monitor CBC intermittently Transfuse per usual ICU guidelines  INFECTIOUS A:   Severe sepsis Persistent fungemia Suspected HCAP P:   Micro and abx as above ID service following - discussed with Dr Daiva Eves Will DC HD cath after CRRT stopped today Will DC fem A line today Consider changing CVL out 2/09  ENDOCRINE A:   Chronic steroids, presumed adrenal insuff hyperglycemia P:   Wean stress steroids as permitted by BP. Cont SSI  NEUROLOGIC A:   Acute metabolic encephalopathy Chronic Pain P:   Wean fent gtt as able Did not tolerate dex due to hypotension/bradycardia RASS goal: 0.   Family updated:    CC time x  40 mins   Billy Fischer, MD ; [facility] [phone].  After 5:30 PM or weekends, call [phone]   01/21/2015, 2:21 PM

## 2015-01-21 NOTE — Progress Notes (Signed)
80 ml of precedex and 35 ml of versed wasted in sink with Edilia Bo, RN. Jacqulynn Cadet

## 2015-01-21 NOTE — Progress Notes (Addendum)
[facility]    Subjective: intubated   Antibiotics:  Anti-infectives    Start     Dose/Rate Route Frequency Ordered Stop   01/20/15 1800  vancomycin (VANCOCIN) IVPB 750 mg/150 ml premix     750 mg150 mL/hr over 60 Minutes Intravenous Every 12 hours 01/20/15 1753     01/17/15 1800  vancomycin (VANCOCIN) IVPB 1000 mg/200 mL premix  Status:  Discontinued     1,000 mg200 mL/hr over 60 Minutes Intravenous Every 24 hours 01/17/15 0140 01/20/15 1753   01/17/15 0600  piperacillin-tazobactam (ZOSYN) IVPB 3.375 g     3.375 g100 mL/hr over 30 Minutes Intravenous 4 times per day 01/17/15 0140     01/16/15 2300  micafungin (MYCAMINE) 100 mg in sodium chloride 0.9 % 100 mL IVPB     100 mg100 mL/hr over 1 Hours Intravenous Daily at bedtime 01/16/15 2140     01/16/15 2200  piperacillin-tazobactam (ZOSYN) IVPB 3.375 g  Status:  Discontinued     3.375 g12.5 mL/hr over 240 Minutes Intravenous 3 times per day 01/16/15 1733 01/17/15 0133   01/16/15 1800  vancomycin (VANCOCIN) IVPB 1000 mg/200 mL premix  Status:  Discontinued     1,000 mg200 mL/hr over 60 Minutes Intravenous Every 8 hours 01/16/15 1733 01/17/15 0134   01/16/15 0400  vancomycin (VANCOCIN) IVPB 1000 mg/200 mL premix  Status:  Discontinued     1,000 mg200 mL/hr over 60 Minutes Intravenous Every 12 hours 01/15/15 1836 01/16/15 1710   01/15/15 2200  piperacillin-tazobactam (ZOSYN) IVPB 3.375 g  Status:  Discontinued     3.375 g12.5 mL/hr over 240 Minutes Intravenous 3 times per day 01/15/15 1853 01/16/15 1710   01/15/15 1600  vancomycin (VANCOCIN) IVPB 1000 mg/200 mL premix     1,000 mg200 mL/hr over 60 Minutes Intravenous  Once 01/15/15 1557 01/15/15 1754   01/15/15 1500  ceFEPIme (MAXIPIME) 2 g in dextrose 5 % 50 mL IVPB     2 g100 mL/hr over 30 Minutes Intravenous  Once 01/15/15 1458 01/15/15 1540   01/15/15 1500  clindamycin (CLEOCIN) IVPB 600 mg     600 mg100 mL/hr over 30 Minutes Intravenous  Once 01/15/15  1458 01/15/15 1537      Medications: Scheduled Meds: . antiseptic oral rinse  7 mL Mouth Rinse QID  . chlorhexidine  15 mL Mouth Rinse BID  . furosemide  160 mg Intravenous BID  . hydrocortisone sod succinate (SOLU-CORTEF) inj  50 mg Intravenous Q12H  . insulin aspart  0-20 Units Subcutaneous 6 times per day  . ipratropium-albuterol  3 mL Nebulization Q6H  . micafungin (MYCAMINE) IV  100 mg Intravenous QHS  . pantoprazole (PROTONIX) IV  40 mg Intravenous Q12H  . piperacillin-tazobactam  3.375 g Intravenous 4 times per day  . potassium chloride  10 mEq Intravenous Q1 Hr x 4  . vancomycin  750 mg Intravenous Q12H   Continuous Infusions: . Marland KitchenTPN (CLINIMIX-E) Adult 120 mL/hr at 01/21/15 1300  . Marland KitchenTPN (CLINIMIX-E) Adult     And  . fat emulsion    . fentaNYL infusion INTRAVENOUS Stopped (01/21/15 1115)  . dialysis replacement fluid (prismasate) 600 mL/hr at 01/21/15 1047  . dialysis replacement fluid (prismasate) 200 mL/hr at 01/20/15 1600  . dialysate (PRISMASATE) 1,500 mL/hr at 01/21/15 1118   PRN Meds:.sodium chloride, albuterol, fentaNYL, fentaNYL, heparin, heparin, midazolam    Objective: Weight change: -5 lb 15.2 oz (-2.7 kg)  Intake/Output Summary (Last 24 hours) at 01/21/15 1334  Last data filed at 01/21/15 1300  Gross per 24 hour  Intake 4579.82 ml  Output   7213 ml  Net -2633.18 ml   Blood pressure 113/68, pulse 97, temperature 97.3 F (36.3 C), temperature source Oral, resp. rate 17, height 5\' 11"  (1.803 m), weight 171 lb 1.2 oz (77.6 kg), SpO2 100 %. Temp:  [97.1 F (36.2 C)-97.5 F (36.4 C)] 97.3 F (36.3 C) (02/08 1151) Pulse Rate:  [50-101] 97 (02/08 1300) Resp:  [11-28] 17 (02/08 1300) BP: (69-125)/(34-73) 113/68 mmHg (02/08 1300) SpO2:  [96 %-100 %] 100 % (02/08 1300) Arterial Line BP: (92-158)/(55-86) 132/60 mmHg (02/08 1300) FiO2 (%):  [40 %] 40 % (02/08 0800) Weight:  [171 lb 1.2 oz (77.6 kg)] 171 lb 1.2 oz (77.6 kg) (02/08 0500)  Physical  Exam: General: Sedated and intubated HEENT: Normocephalic,  IJ site clean , Right HD catheter in place and in use on right. CVS regular rate, normal r, no murmur rubs or gallops Chest:rhonchi Abdomen: soft, bandage in place.  Extremities: 2+ edema Skin: IJ left neck, HD catheter right neck, left femoral arterial line Neuro: nonfocal   CBC:   CBC Latest Ref Rng 01/21/2015 01/20/2015 01/19/2015  WBC 4.0 - 10.5 K/uL 8.9 6.0 7.4  Hemoglobin 13.0 - 17.0 g/dL 1.6(X) 7.6(L) 7.6(L)  Hematocrit 39.0 - 52.0 % 26.3(L) 23.6(L) 24.0(L)  Platelets 150 - 400 K/uL 26(LL) 23(LL) 34(L)       BMET  Recent Labs  01/20/15 1600 01/21/15 0349  NA 133* 135  K 4.0 3.3*  CL 101 101  CO2 27 28  GLUCOSE 216* 178*  BUN 50* 46*  CREATININE 1.11 1.06  CALCIUM 6.9* 7.3*     Liver Panel   Recent Labs  01/19/15 0408  01/20/15 1600 01/21/15 0349  PROT 4.4*  --   --  5.3*  ALBUMIN 2.1*  < > 2.0* 2.3*  AST 122*  --   --  60*  ALT 98*  --   --  75*  ALKPHOS 130*  --   --  178*  BILITOT 4.8*  --   --  5.8*  < > = values in this interval not displayed.     Sedimentation Rate No results for input(s): ESRSEDRATE in the last 72 hours. C-Reactive Protein No results for input(s): CRP in the last 72 hours.  Micro Results: Recent Results (from the past 720 hour(s))  Wound culture     Status: None   Collection Time: 12/24/14  2:30 PM  Result Value Ref Range Status   Specimen Description WOUND GASTROSTOMY TUBE SITE  Final   Special Requests NONE  Final   Gram Stain   Final    RARE WBC PRESENT, PREDOMINANTLY MONONUCLEAR NO SQUAMOUS EPITHELIAL CELLS SEEN NO ORGANISMS SEEN Performed at [facility]    Culture   Final    RARE PSEUDOMONAS AERUGINOSA Performed at [facility]    Report Status 12/28/2014 FINAL  Final   Organism ID, Bacteria PSEUDOMONAS AERUGINOSA  Final      Susceptibility   Pseudomonas aeruginosa - MIC*    CEFEPIME 8 SENSITIVE Sensitive     CEFTAZIDIME 4  SENSITIVE Sensitive     CIPROFLOXACIN <=0.25 SENSITIVE Sensitive     GENTAMICIN 8 INTERMEDIATE Intermediate     IMIPENEM 1 SENSITIVE Sensitive     PIP/TAZO 8 SENSITIVE Sensitive     TOBRAMYCIN <=1 SENSITIVE Sensitive     * RARE PSEUDOMONAS AERUGINOSA  Clostridium Difficile by PCR  Status: None   Collection Time: 12/26/14 10:16 PM  Result Value Ref Range Status   C difficile by pcr NEGATIVE NEGATIVE Final  Wound culture     Status: None   Collection Time: 12/31/14 12:54 AM  Result Value Ref Range Status   Specimen Description WOUND ABDOMEN  Final   Special Requests IMMUNE:COMPROMISED  Final   Gram Stain   Final    RARE WBC PRESENT, PREDOMINANTLY MONONUCLEAR NO SQUAMOUS EPITHELIAL CELLS SEEN RARE GRAM POSITIVE COCCI IN PAIRS IN CLUSTERS FEW YEAST Performed at [facility]    Culture   Final    MULTIPLE ORGANISMS PRESENT, NONE PREDOMINANT Note: NO GROUP A STREP (S.PYOGENES) ISOLATED NO STAPHYLOCOCCUS AUREUS ISOLATED Performed at [facility]    Report Status 01/03/2015 FINAL  Final  Blood culture (routine x 2)     Status: None (Preliminary result)   Collection Time: 01/15/15  3:07 PM  Result Value Ref Range Status   Specimen Description BLOOD LEFT ANTECUBITAL  Final   Special Requests BOTTLES DRAWN AEROBIC AND ANAEROBIC [facility]  Final   Culture   Final    YEAST Note: Gram Stain Report Called to,Read Back By and Verified With: Armida Sans 1803 01/16/15 BY Ginette Pitman Performed at [facility] Performed at [facility]    Report Status PENDING  Incomplete  Blood culture (routine x 2)     Status: None (Preliminary result)   Collection Time: 01/15/15  3:12 PM  Result Value Ref Range Status   Specimen Description BLOOD LEFT ARM  Final   Special Requests BOTTLES DRAWN AEROBIC AND ANAEROBIC 8CC  Final   Culture   Final    YEAST Note: Gram Stain Report Called to,Read Back By and Verified With: Armida Sans 1803 01/16/15 BY Ginette Pitman Performed at [facility] Performed at [facility]    Report Status PENDING  Incomplete  MRSA PCR Screening     Status: None   Collection Time: 01/15/15  6:33 PM  Result Value Ref Range Status   MRSA by PCR NEGATIVE NEGATIVE Final    Comment:        The GeneXpert MRSA Assay (FDA approved for NASAL specimens only), is one component of a comprehensive MRSA colonization surveillance program. It is not intended to diagnose MRSA infection nor to guide or monitor treatment for MRSA infections.   Culture, respiratory (tracheal aspirate)     Status: None   Collection Time: 01/16/15  5:46 PM  Result Value Ref Range Status   Specimen Description TRACHEAL ASPIRATE  Final   Special Requests Normal  Final   Gram Stain   Final    FEW WBC PRESENT, PREDOMINANTLY PMN NO SQUAMOUS EPITHELIAL CELLS SEEN NO ORGANISMS SEEN Performed at [facility]    Culture   Final    NO GROWTH 2 DAYS Performed at [facility]    Report Status 01/19/2015 FINAL  Final  Culture, blood (routine x 2)     Status: None   Collection Time: 01/16/15  6:30 PM  Result Value Ref Range Status   Specimen Description BLOOD LEFT ARM  Final   Special Requests BOTTLES DRAWN AEROBIC AND ANAEROBIC 5CC  Final   Culture   Final    CANDIDA PARAPSILOSIS Note: Gram Stain Report Called to,Read Back By and Verified With: PHYLLIS MCKINNEY ON 2.5.2016 AT 7:30P BY WILEJ Performed at [facility]    Report Status 01/21/2015 FINAL  Final  Culture, blood (routine x  2)     Status: None   Collection Time: 01/16/15  6:45 PM  Result Value Ref Range Status   Specimen Description BLOOD LEFT HAND  Final   Special Requests BOTTLES DRAWN AEROBIC AND ANAEROBIC 5CC  Final   Culture   Final    CANDIDA GLABRATA Note: Gram Stain Report Called to,Read Back By and Verified With: CHRISTINE MATHERLY 01/19/15 [id number] [facility] Performed at [facility]    Report Status 01/21/2015 FINAL  Final  Culture, respiratory  (NON-Expectorated)     Status: None   Collection Time: 01/17/15  8:40 AM  Result Value Ref Range Status   Specimen Description TRACHEAL SITE  Final   Special Requests NONE  Final   Gram Stain   Final    FEW WBC PRESENT,BOTH PMN AND MONONUCLEAR RARE SQUAMOUS EPITHELIAL CELLS PRESENT NO ORGANISMS SEEN Performed at [facility]    Culture   Final    Non-Pathogenic Oropharyngeal-type Flora Isolated. Performed at [facility]    Report Status 01/20/2015 FINAL  Final  Culture, blood (routine x 2)     Status: None (Preliminary result)   Collection Time: 01/17/15  1:13 PM  Result Value Ref Range Status   Specimen Description BLOOD LEFT HAND  Final   Special Requests BOTTLES DRAWN AEROBIC ONLY 7.5CC  Final   Culture   Final           BLOOD CULTURE RECEIVED NO GROWTH TO DATE CULTURE WILL BE HELD FOR 5 DAYS BEFORE ISSUING A FINAL NEGATIVE REPORT Performed at [facility]    Report Status PENDING  Incomplete  Culture, blood (routine x 2)     Status: None (Preliminary result)   Collection Time: 01/17/15  6:50 PM  Result Value Ref Range Status   Specimen Description BLOOD LEFT HAND  Final   Special Requests   Final    BOTTLES DRAWN AEROBIC AND ANAEROBIC 5CC AER,2CC ANA   Culture   Final    YEAST Note: Gram Stain Report Called to,Read Back By and Verified With: P PATEL RN 01/20/15 AT 1020 AM BY [facility] Performed at [facility]    Report Status PENDING  Incomplete  Urine culture     Status: None   Collection Time: 01/17/15  8:42 PM  Result Value Ref Range Status   Specimen Description URINE, CATHETERIZED  Final   Special Requests Normal  Final   Colony Count NO GROWTH Performed at [facility]   Final   Culture NO GROWTH Performed at [facility]   Final   Report Status 01/19/2015 FINAL  Final    Studies/Results: Dg Chest Port 1 View  01/20/2015   CLINICAL DATA:  Subsequent encounter for respiratory failure. Diabetes. Pneumonia.  Shortness of breath.  EXAM: PORTABLE CHEST - 1 VIEW  COMPARISON:  One day prior  FINDINGS: Endotracheal tube terminates 2.7 cm above carina. Left internal jugular line tip high SVC. Right internal jugular line tip mid SVC.  Nasogastric tube extends beyond the  inferior aspect of the film.  Patient rotated right. Borderline cardiomegaly. No pleural effusion or pneumothorax. Slightly improved aeration. Patchy multifocal airspace disease remains. Most confluent in the right upper and left lower lungs.  IMPRESSION: Improved aeration with multi focal infection and/or pulmonary edema remaining.   Electronically Signed   By: Jeronimo Greaves M.D.   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 male with multiple medical problems cirrhosis Crohn's disease on TPN via PICC at home admitted with apparent healthcare associated pneumonia sepsis +/- intrabdominal infection now found to be fungemic with C glabrata and C parapsilosis  #1 Persistently positive candidemia despite removal of his PICC line and initiation of micafungin. His hemodialysis catheter femoral arterial line and internal jugular vein line are all undoubtedly seeded with Candida and responsible for his persistent candidemia.  --he needs to have all of these lines removed as soon as it is medically feasible to facilitate clearance  --he DEFINITELY NEEDS TO STAY on MICAFUNGIN given C glabrata  --he will also need a TEE given  persistently positive cultures  --he will also need formal dilated fundoscopic exam by ophthalmology   #2 healthcare associated pneumonia: Currently on vancomycin and Zosyn and these are reasonable, would like to dc them in a few days as he continues to  improve  #3 ? Pneumatosis: not felt to have perforation. Followed closely by [facility] and CCM  Case discussed with Dr Sung Amabile with CCM.    LOS: 6 days   Acey Lav 01/21/2015, 1:34 PM

## 2015-01-21 NOTE — Progress Notes (Signed)
E-link called concerning placement of g-tube. Made aware that output has increased compared to last night. Nurse said it looks okay. Will redress and monitor. Jacqulynn Cadet

## 2015-01-21 NOTE — Procedures (Signed)
Extubation Procedure Note  Patient Details:   Name: David [name] DOB: 1976/05/15 MRN: [id number]   Airway Documentation: patient weaned over an hour on 5/5 40%, patient has audible cuff leak, spontaneous volumes 450-545, RR 23    Evaluation  O2 sats: stable throughout Complications: No apparent complications Patient did tolerate procedure well. Bilateral Breath Sounds: Diminished, Rhonchi Suctioning: Airway No patient is turning head, making sounds, at times able to follow commands.  Extubated to 3lpm [place], HR 105, sat 100%, BP 101/63. Good cough suctioned out large amounts of thick, tan secretions.  Elbert Ewings Meeker 01/21/2015, 11:00 AM

## 2015-01-21 NOTE — Progress Notes (Addendum)
100 ml of fentanyl wasted in sink with Laverna Peace, RN. Milly Jakob A  Witnessed wasting 100 ml of fentanyl with Milly Jakob, RN.  Tommi Emery

## 2015-01-22 ENCOUNTER — Inpatient Hospital Stay (HOSPITAL_COMMUNITY): Payer: Medicare Other

## 2015-01-22 DIAGNOSIS — R0602 Shortness of breath: Secondary | ICD-10-CM

## 2015-01-22 LAB — CULTURE, BLOOD (ROUTINE X 2)

## 2015-01-22 LAB — GLUCOSE, CAPILLARY
GLUCOSE-CAPILLARY: 201 mg/dL — AB (ref 70–99)
GLUCOSE-CAPILLARY: 141 mg/dL — AB (ref 70–99)
Glucose-Capillary: 116 mg/dL — ABNORMAL HIGH (ref 70–99)
Glucose-Capillary: 140 mg/dL — ABNORMAL HIGH (ref 70–99)
Glucose-Capillary: 141 mg/dL — ABNORMAL HIGH (ref 70–99)
Glucose-Capillary: 170 mg/dL — ABNORMAL HIGH (ref 70–99)
Glucose-Capillary: 187 mg/dL — ABNORMAL HIGH (ref 70–99)
Glucose-Capillary: 95 mg/dL (ref 70–99)

## 2015-01-22 LAB — CBC
HEMATOCRIT: 22.4 % — AB (ref 39.0–52.0)
Hemoglobin: 7.3 g/dL — ABNORMAL LOW (ref 13.0–17.0)
MCH: 25.4 pg — ABNORMAL LOW (ref 26.0–34.0)
MCHC: 32.6 g/dL (ref 30.0–36.0)
MCV: 78 fL (ref 78.0–100.0)
Platelets: 33 10*3/uL — ABNORMAL LOW (ref 150–400)
RBC: 2.87 MIL/uL — ABNORMAL LOW (ref 4.22–5.81)
RDW: 19.8 % — AB (ref 11.5–15.5)
WBC: 6.4 10*3/uL (ref 4.0–10.5)

## 2015-01-22 LAB — RENAL FUNCTION PANEL
Albumin: 2.1 g/dL — ABNORMAL LOW (ref 3.5–5.2)
Anion gap: 8 (ref 5–15)
BUN: 63 mg/dL — ABNORMAL HIGH (ref 6–23)
CHLORIDE: 101 mmol/L (ref 96–112)
CO2: 27 mmol/L (ref 19–32)
Calcium: 7.7 mg/dL — ABNORMAL LOW (ref 8.4–10.5)
Creatinine, Ser: 1.31 mg/dL (ref 0.50–1.35)
GFR, EST AFRICAN AMERICAN: 78 mL/min — AB (ref >90)
GFR, EST NON AFRICAN AMERICAN: 68 mL/min — AB (ref >90)
GLUCOSE: 284 mg/dL — AB (ref 70–99)
PHOSPHORUS: 4.8 mg/dL — AB (ref 2.3–4.6)
POTASSIUM: 3 mmol/L — AB (ref 3.5–5.1)
Sodium: 136 mmol/L (ref 135–145)

## 2015-01-22 LAB — MAGNESIUM: MAGNESIUM: 2 mg/dL (ref 1.5–2.5)

## 2015-01-22 MED ORDER — PERFLUTREN LIPID MICROSPHERE
1.0000 mL | INTRAVENOUS | Status: AC | PRN
  Administered 2015-01-22: 2 mL via INTRAVENOUS
  Filled 2015-01-22: qty 10

## 2015-01-22 MED ORDER — HYDROCORTISONE NA SUCCINATE PF 100 MG IJ SOLR
25.0000 mg | Freq: Two times a day (BID) | INTRAMUSCULAR | Status: DC
  Administered 2015-01-22 – 2015-01-28 (×12): 25 mg via INTRAVENOUS
  Filled 2015-01-22 (×14): qty 0.5

## 2015-01-22 MED ORDER — POTASSIUM CHLORIDE 10 MEQ/50ML IV SOLN
INTRAVENOUS | Status: AC
  Filled 2015-01-22: qty 50

## 2015-01-22 MED ORDER — KCL IN DEXTROSE-NACL 20-5-0.45 MEQ/L-%-% IV SOLN
INTRAVENOUS | Status: DC
  Administered 2015-01-22: 14:00:00 via INTRAVENOUS
  Filled 2015-01-22 (×3): qty 1000

## 2015-01-22 MED ORDER — POTASSIUM CHLORIDE 10 MEQ/50ML IV SOLN
10.0000 meq | INTRAVENOUS | Status: AC
  Administered 2015-01-22 (×6): 10 meq via INTRAVENOUS
  Filled 2015-01-22 (×5): qty 50

## 2015-01-22 MED ORDER — M.V.I. ADULT IV INJ
INTRAVENOUS | Status: DC
  Filled 2015-01-22: qty 2280

## 2015-01-22 MED ORDER — PIPERACILLIN-TAZOBACTAM 3.375 G IVPB
3.3750 g | Freq: Three times a day (TID) | INTRAVENOUS | Status: DC
  Administered 2015-01-22: 3.375 g via INTRAVENOUS
  Filled 2015-01-22 (×3): qty 50

## 2015-01-22 MED ORDER — FENTANYL CITRATE 0.05 MG/ML IJ SOLN
25.0000 ug | INTRAMUSCULAR | Status: DC | PRN
  Administered 2015-01-22: 100 ug via INTRAVENOUS
  Administered 2015-01-22: 75 ug via INTRAVENOUS
  Administered 2015-01-22 – 2015-01-27 (×41): 100 ug via INTRAVENOUS
  Filled 2015-01-22 (×42): qty 2

## 2015-01-22 MED ORDER — FAT EMULSION 20 % IV EMUL
240.0000 mL | INTRAVENOUS | Status: DC
  Filled 2015-01-22: qty 250

## 2015-01-22 NOTE — Progress Notes (Signed)
[facility] ADULT ICU REPLACEMENT PROTOCOL FOR AM LAB REPLACEMENT ONLY  The patient does not apply for the [facility] Adult ICU Electrolyte Replacment Protocol based on the criteria listed below:     Is BUN < 60 mg/dL? No.  Patient's BUN today is 63 Abnormal electrolyte(s): K3.0   If a panic level lab has been reported, has the CCM MD in charge been notified? yes Physician:  E Deterding,MD  Melrose Nakayama 01/22/2015 5:36 AM

## 2015-01-22 NOTE — Progress Notes (Signed)
[facility]       Subjective:  Feels much much better   Antibiotics:  Anti-infectives    Start     Dose/Rate Route Frequency Ordered Stop   01/22/15 0200  piperacillin-tazobactam (ZOSYN) IVPB 3.375 g  Status:  Discontinued     3.375 g12.5 mL/hr over 240 Minutes Intravenous 3 times per day 01/22/15 0013 01/22/15 1036   01/20/15 1800  vancomycin (VANCOCIN) IVPB 750 mg/150 ml premix  Status:  Discontinued     750 mg150 mL/hr over 60 Minutes Intravenous Every 12 hours 01/20/15 1753 01/22/15 1036   01/17/15 1800  vancomycin (VANCOCIN) IVPB 1000 mg/200 mL premix  Status:  Discontinued     1,000 mg200 mL/hr over 60 Minutes Intravenous Every 24 hours 01/17/15 0140 01/20/15 1753   01/17/15 0600  piperacillin-tazobactam (ZOSYN) IVPB 3.375 g  Status:  Discontinued     3.375 g100 mL/hr over 30 Minutes Intravenous 4 times per day 01/17/15 0140 01/22/15 0013   01/16/15 2300  micafungin (MYCAMINE) 100 mg in sodium chloride 0.9 % 100 mL IVPB     100 mg100 mL/hr over 1 Hours Intravenous Daily at bedtime 01/16/15 2140     01/16/15 2200  piperacillin-tazobactam (ZOSYN) IVPB 3.375 g  Status:  Discontinued     3.375 g12.5 mL/hr over 240 Minutes Intravenous 3 times per day 01/16/15 1733 01/17/15 0133   01/16/15 1800  vancomycin (VANCOCIN) IVPB 1000 mg/200 mL premix  Status:  Discontinued     1,000 mg200 mL/hr over 60 Minutes Intravenous Every 8 hours 01/16/15 1733 01/17/15 0134   01/16/15 0400  vancomycin (VANCOCIN) IVPB 1000 mg/200 mL premix  Status:  Discontinued     1,000 mg200 mL/hr over 60 Minutes Intravenous Every 12 hours 01/15/15 1836 01/16/15 1710   01/15/15 2200  piperacillin-tazobactam (ZOSYN) IVPB 3.375 g  Status:  Discontinued     3.375 g12.5 mL/hr over 240 Minutes Intravenous 3 times per day 01/15/15 1853 01/16/15 1710   01/15/15 1600  vancomycin (VANCOCIN) IVPB 1000 mg/200 mL premix     1,000 mg200 mL/hr over 60 Minutes Intravenous  Once 01/15/15 1557  01/15/15 1754   01/15/15 1500  ceFEPIme (MAXIPIME) 2 g in dextrose 5 % 50 mL IVPB     2 g100 mL/hr over 30 Minutes Intravenous  Once 01/15/15 1458 01/15/15 1540   01/15/15 1500  clindamycin (CLEOCIN) IVPB 600 mg     600 mg100 mL/hr over 30 Minutes Intravenous  Once 01/15/15 1458 01/15/15 1537      Medications: Scheduled Meds: . chlorhexidine  15 mL Mouth Rinse BID  . hydrocortisone sod succinate (SOLU-CORTEF) inj  25 mg Intravenous Q12H  . insulin aspart  0-20 Units Subcutaneous 6 times per day  . micafungin (MYCAMINE) IV  100 mg Intravenous QHS  . pantoprazole (PROTONIX) IV  40 mg Intravenous Q12H  . potassium chloride  10 mEq Intravenous Q1 Hr x 6  . potassium chloride       Continuous Infusions: . dextrose 5 % and 0.45 % NaCl with KCl 20 mEq/L 50 mL/hr at 01/22/15 1400   PRN Meds:.sodium chloride, albuterol, fentaNYL    Objective: Weight change: -7 lb 11.5 oz (-3.5 kg)  Intake/Output Summary (Last 24 hours) at 01/22/15 1405 Last data filed at 01/22/15 1200  Gross per 24 hour  Intake   4118 ml  Output   5831 ml  Net  -1713 ml   Blood pressure 109/65, pulse 91, temperature 98.4 F (36.9 C), temperature  source Oral, resp. rate 21, height 5\' 11"  (1.803 m), weight 163 lb 5.8 oz (74.1 kg), SpO2 100 %. Temp:  [97.7 F (36.5 C)-98.4 F (36.9 C)] 98.4 F (36.9 C) (02/09 1110) Pulse Rate:  [85-124] 91 (02/09 1200) Resp:  [16-30] 21 (02/09 1200) BP: (96-128)/(48-80) 109/65 mmHg (02/09 1200) SpO2:  [91 %-100 %] 100 % (02/09 1200) Weight:  [163 lb 5.8 oz (74.1 kg)] 163 lb 5.8 oz (74.1 kg) (02/09 0500)  Physical Exam: General: Alert oriented conversant HEENT: Normocephalic,  IJ site clean ,  CVS regular rate, normal r, no murmur rubs or gallops Chest:rhonchi Abdomen: soft, bandage in place.  Extremities: 2+ edema Neuro: nonfocal   CBC:   CBC Latest Ref Rng 01/22/2015 01/21/2015 01/20/2015  WBC 4.0 - 10.5 K/uL 6.4 8.9 6.0  Hemoglobin 13.0 - 17.0 g/dL 7.3(L) 8.4(L) 7.6(L)    Hematocrit 39.0 - 52.0 % 22.4(L) 26.3(L) 23.6(L)  Platelets 150 - 400 K/uL 33(L) 26(LL) 23(LL)       BMET  Recent Labs  01/21/15 1640 01/22/15 0420  NA 134* 136  K 3.9 3.0*  CL 101 101  CO2 28 27  GLUCOSE 183* 284*  BUN 40* 63*  CREATININE 1.00 1.31  CALCIUM 7.4* 7.7*     Liver Panel   Recent Labs  01/21/15 0349 01/21/15 1640 01/22/15 0420  PROT 5.3*  --   --   ALBUMIN 2.3* 2.3* 2.1*  AST 60*  --   --   ALT 75*  --   --   ALKPHOS 178*  --   --   BILITOT 5.8*  --   --        Sedimentation Rate No results for input(s): ESRSEDRATE in the last 72 hours. C-Reactive Protein No results for input(s): CRP in the last 72 hours.  Micro Results: Recent Results (from the past 720 hour(s))  Wound culture     Status: None   Collection Time: 12/24/14  2:30 PM  Result Value Ref Range Status   Specimen Description WOUND GASTROSTOMY TUBE SITE  Final   Special Requests NONE  Final   Gram Stain   Final    RARE WBC PRESENT, PREDOMINANTLY MONONUCLEAR NO SQUAMOUS EPITHELIAL CELLS SEEN NO ORGANISMS SEEN Performed at [facility]    Culture   Final    RARE PSEUDOMONAS AERUGINOSA Performed at [facility]    Report Status 12/28/2014 FINAL  Final   Organism ID, Bacteria PSEUDOMONAS AERUGINOSA  Final      Susceptibility   Pseudomonas aeruginosa - MIC*    CEFEPIME 8 SENSITIVE Sensitive     CEFTAZIDIME 4 SENSITIVE Sensitive     CIPROFLOXACIN <=0.25 SENSITIVE Sensitive     GENTAMICIN 8 INTERMEDIATE Intermediate     IMIPENEM 1 SENSITIVE Sensitive     PIP/TAZO 8 SENSITIVE Sensitive     TOBRAMYCIN <=1 SENSITIVE Sensitive     * RARE PSEUDOMONAS AERUGINOSA  Clostridium Difficile by PCR     Status: None   Collection Time: 12/26/14 10:16 PM  Result Value Ref Range Status   C difficile by pcr NEGATIVE NEGATIVE Final  Wound culture     Status: None   Collection Time: 12/31/14 12:54 AM  Result Value Ref Range Status   Specimen Description WOUND ABDOMEN   Final   Special Requests IMMUNE:COMPROMISED  Final   Gram Stain   Final    RARE WBC PRESENT, PREDOMINANTLY MONONUCLEAR NO SQUAMOUS EPITHELIAL CELLS SEEN RARE GRAM POSITIVE COCCI IN PAIRS IN CLUSTERS FEW YEAST  Performed at [facility]   Final    MULTIPLE ORGANISMS PRESENT, NONE PREDOMINANT Note: NO GROUP A STREP (S.PYOGENES) ISOLATED NO STAPHYLOCOCCUS AUREUS ISOLATED Performed at [facility]    Report Status 01/03/2015 FINAL  Final  Blood culture (routine x 2)     Status: None   Collection Time: 01/15/15  3:07 PM  Result Value Ref Range Status   Specimen Description BLOOD LEFT ANTECUBITAL  Final   Special Requests BOTTLES DRAWN AEROBIC AND ANAEROBIC [facility]  Final   Culture   Final    CANDIDA GLABRATA CANDIDA PARAPSILOSIS Note: Gram Stain Report Called to,Read Back By and Verified With: [facility] G[place] 1803 01/16/15 BY Ginette Pitman Performed at [facility] Performed at [facility]    Report Status 01/22/2015 FINAL  Final  Blood culture (routine x 2)     Status: None   Collection Time: 01/15/15  3:12 PM  Result Value Ref Range Status   Specimen Description BLOOD LEFT ARM  Final   Special Requests BOTTLES DRAWN AEROBIC AND ANAEROBIC 8CC  Final   Culture   Final    CANDIDA GLABRATA CANDIDA PARAPSILOSIS Note: Gram Stain Report Called to,Read Back By and Verified With: [facility] [place] 1803 01/16/15 BY Ginette Pitman Performed at [facility] Performed at [facility]    Report Status 01/22/2015 FINAL  Final  MRSA PCR Screening     Status: None   Collection Time: 01/15/15  6:33 PM  Result Value Ref Range Status   MRSA by PCR NEGATIVE NEGATIVE Final    Comment:        The GeneXpert MRSA Assay (FDA approved for NASAL specimens only), is one component of a comprehensive MRSA colonization surveillance program. It is not intended to diagnose MRSA infection nor to guide or monitor treatment for MRSA infections.   Culture, respiratory  (tracheal aspirate)     Status: None   Collection Time: 01/16/15  5:46 PM  Result Value Ref Range Status   Specimen Description TRACHEAL ASPIRATE  Final   Special Requests Normal  Final   Gram Stain   Final    FEW WBC PRESENT, PREDOMINANTLY PMN NO SQUAMOUS EPITHELIAL CELLS SEEN NO ORGANISMS SEEN Performed at [facility]    Culture   Final    NO GROWTH 2 DAYS Performed at [facility]    Report Status 01/19/2015 FINAL  Final  Culture, blood (routine x 2)     Status: None   Collection Time: 01/16/15  6:30 PM  Result Value Ref Range Status   Specimen Description BLOOD LEFT ARM  Final   Special Requests BOTTLES DRAWN AEROBIC AND ANAEROBIC 5CC  Final   Culture   Final    CANDIDA PARAPSILOSIS Note: Gram Stain Report Called to,Read Back By and Verified With: PHYLLIS MCKINNEY ON 2.5.2016 AT 7:30P BY WILEJ Performed at [facility]    Report Status 01/21/2015 FINAL  Final  Culture, blood (routine x 2)     Status: None   Collection Time: 01/16/15  6:45 PM  Result Value Ref Range Status   Specimen Description BLOOD LEFT HAND  Final   Special Requests BOTTLES DRAWN AEROBIC AND ANAEROBIC 5CC  Final   Culture   Final    CANDIDA GLABRATA Note: Gram Stain Report Called to,Read Back By and Verified With: CHRISTINE MATHERLY 01/19/15 0455A [facility] Performed at [facility]    Report Status 01/21/2015 FINAL  Final  Culture, respiratory (NON-Expectorated)  Status: None   Collection Time: 01/17/15  8:40 AM  Result Value Ref Range Status   Specimen Description TRACHEAL SITE  Final   Special Requests NONE  Final   Gram Stain   Final    FEW WBC PRESENT,BOTH PMN AND MONONUCLEAR RARE SQUAMOUS EPITHELIAL CELLS PRESENT NO ORGANISMS SEEN Performed at [facility]    Culture   Final    Non-Pathogenic Oropharyngeal-type Flora Isolated. Performed at [facility]    Report Status 01/20/2015 FINAL  Final  Culture, blood (routine x 2)     Status: None  (Preliminary result)   Collection Time: 01/17/15  1:13 PM  Result Value Ref Range Status   Specimen Description BLOOD LEFT HAND  Final   Special Requests BOTTLES DRAWN AEROBIC ONLY 7.5CC  Final   Culture   Final           BLOOD CULTURE RECEIVED NO GROWTH TO DATE CULTURE WILL BE HELD FOR 5 DAYS BEFORE ISSUING A FINAL NEGATIVE REPORT Performed at [facility]    Report Status PENDING  Incomplete  Culture, blood (routine x 2)     Status: None (Preliminary result)   Collection Time: 01/17/15  6:50 PM  Result Value Ref Range Status   Specimen Description BLOOD LEFT HAND  Final   Special Requests   Final    BOTTLES DRAWN AEROBIC AND ANAEROBIC 5CC AER,2CC ANA   Culture   Final    YEAST Note: Gram Stain Report Called to,Read Back By and Verified With: P PATEL RN 01/20/15 AT 1020 AM BY [facility] Performed at [facility]    Report Status PENDING  Incomplete  Urine culture     Status: None   Collection Time: 01/17/15  8:42 PM  Result Value Ref Range Status   Specimen Description URINE, CATHETERIZED  Final   Special Requests Normal  Final   Colony Count NO GROWTH Performed at [facility]   Final   Culture NO GROWTH Performed at [facility]   Final   Report Status 01/19/2015 FINAL  Final  Culture, blood (routine x 2)     Status: None (Preliminary result)   Collection Time: 01/21/15 12:57 PM  Result Value Ref Range Status   Specimen Description BLOOD RIGHT ANTECUBITAL  Final   Special Requests BOTTLES DRAWN AEROBIC ONLY 6CC  Final   Culture   Final           BLOOD CULTURE RECEIVED NO GROWTH TO DATE CULTURE WILL BE HELD FOR 5 DAYS BEFORE ISSUING A FINAL NEGATIVE REPORT Performed at [facility]    Report Status PENDING  Incomplete  Culture, blood (routine x 2)     Status: None (Preliminary result)   Collection Time: 01/21/15  2:07 PM  Result Value Ref Range Status   Specimen Description BLOOD RIGHT ARM  Final   Special Requests BOTTLES DRAWN  AEROBIC AND ANAEROBIC 5CC  Final   Culture   Final           BLOOD CULTURE RECEIVED NO GROWTH TO DATE CULTURE WILL BE HELD FOR 5 DAYS BEFORE ISSUING A FINAL NEGATIVE REPORT Performed at [facility]    Report Status PENDING  Incomplete    Studies/Results: Dg Chest Port 1 View  01/22/2015   CLINICAL DATA:  Respiratory failure.  EXAM: PORTABLE CHEST - 1 VIEW  COMPARISON:  01/20/2015.  FINDINGS: Interim removal of endotracheal tube, NG tube, and right IJ sheath. Left IJ line in stable position.  Mediastinum and hilar structures are normal. Heart size normal. Interim near complete clearing of bilateral pulmonary infiltrates with mild residual in right upper lobe. Stable elevation left hemidiaphragm. No pleural effusion or pneumothorax identified.  IMPRESSION: 1. Interim removal of tracheostomy tube, NG tube, right IJ sheath. Left IJ line in stable position. 2. Interim near-complete clearing of bilateral pulmonary infiltrates with mild residual right upper lobe. Stable elevation left hemidiaphragm.   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o. male with multiple medical problems cirrhosis Crohn's  disease on TPN via PICC at home admitted with apparent healthcare associated pneumonia sepsis +/- intrabdominal infection now found to be fungemic with C glabrata and C parapsilosis  #1 Persistently positive candidemia despite removal of his PICC line and initiation of micafungin. His hemodialysis catheter femoral arterial line and internal jugular vein line are all undoubtedly seeded with Candida and responsible for his persistent candidemia.  He needs to have all of these lines removed as soon as it is medically feasible to facilitate clearance. When I saw him this am everything but IJ was out  --WOULD DC IJ AND ALSO GIVE HIM A "HOLIDAY FROM TPN AND CENTRAL LINE"  --REPEAT BLOOD CULTURES ONCE LINES ALL OUT  --he DEFINITELY NEEDS TO STAY on MICAFUNGIN given C glabrata  --2D echo was negative but I would also get a TEE during his stay  given persistently positive cultures  --he will also need formal dilated fundoscopic exam by ophthalmology   #2 healthcare associated pneumonia: Currently on vancomycin and Zosyn and these are reasonable,. Fine to DC them today  #3 ? Pneumatosis: not felt to have perforation. Followed closely by [facility] and CCM  Case discussed with Dr Sung Amabile with CCM.    LOS: 7 days   Acey Lav 01/22/2015, 2:05 PM

## 2015-01-22 NOTE — Progress Notes (Signed)
PULMONARY / CRITICAL CARE MEDICINE   Name: David [name] MRN: [id number] DOB: Feb 21, 1976    ADMISSION DATE:  01/15/2015 CONSULTATION DATE:  01/22/2015  REFERRING MD :  [facility]  CHIEF COMPLAINT:  Dyspnea  INITIAL PRESENTATION:  39 y.o. M who presented to [facility] ED on 2/2 with 2 days of dyspnea, cough, fevers.  He was found to be hypoxic requiring NRB and CXR suggestive of HCAP/ARDS/ fungemia Maintained on TNA via RUE PICC. Of note, he has Crohn's of the small and large bowel and is awaiting small bowel transplantation at [facility].  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   MAJOR EVENTS/TEST RESULTS: 2/02 - admitted to [facility] 2/03 - intubated, transferred to [facility] ICU.  Started on ARDS protocol 2/03 Renal consult: CRRT initiated for mgmt of severe acidosis and hyperkalemia 2/03 Gen surg consult: CT abdomen ordered. Noted that pt was difficult surgery @ [facility] 2/04 CTAP: Diffuse abnormal appearance of the bowel with multifocal bowel wall thick in. There is probable pneumatosis involving small bowel in the lower abdomen. No definite perforation. There is increased mesenteric edema and free fluid from prior exam. Decreased hydronephrosis. Splenomegaly. Worsening airspace disease in the lower lungs, left greater than Right 2/04 ID consult for fungemia: Micafungin added 2/07 Ventilator dyssynchrony with appearance of proximal airway obstruction. Improved with suctioning. Off vasopressors. Agitation difficult to control.  2/08 Passed SBT. Remains agitated. Extubated and tolerating. Weaning vasopressors to off. Persistent fungemia on F/U BCs. ID service recommends echocardiogram and change of indwelling catheters/lines. Uo picking up with furosemide. CRRT continued.  2/09 TTE: The estimated ejection fraction was 65%. Wall motion was normal; there were no regional wall motion  abnormalities. Slight dilitation of aortic root. No obvious vegetations seen 2/09 Transfer to SDU ordered    INDWELLING DEVICES:: ETT 2/03 >> 2/08 R femoral A-line 2/03 >> 2/08 R IJ HD cath 2/04 >> 2/08 L IJ CVL 2/04 >> 2/09   MICRO DATA: MRSA PCR 2/02 >> NEG Urine 2/04 >> NEG Resp 2/03 >> NEG Blood 2/02 >> 2/2 yeast Blood 2/03 >> 2/2 candida glabrata Blood 2/04 >> 1/2 yeast Resp 2/04 >> NOF Blood 2/08 >>   ANTIMICROBIALS:  Clinda 2/02 >> 2/02 Cefepime 2/02 >> 2/02 Vanc 2/02 >> 2/09 Pip-tazo 2/02 >> 2/09 Micafungin 2/03 >>    SUBJECTIVE:  RASS 0. Mildly confused. No distress. Has tolerated extubation well  VITAL SIGNS: Temp:  [97.7 F (36.5 C)-98.4 F (36.9 C)] 98.2 F (36.8 C) (02/09 1518) Pulse Rate:  [85-124] 85 (02/09 1600) Resp:  [16-28] 20 (02/09 1600) BP: (96-128)/(48-80) 104/66 mmHg (02/09 1600) SpO2:  [91 %-100 %] 100 % (02/09 1600) Weight:  [74.1 kg (163 lb 5.8 oz)] 74.1 kg (163 lb 5.8 oz) (02/09 0500) HEMODYNAMICS: CVP:  [0 mmHg-11 mmHg] 2 mmHg VENTILATOR SETTINGS:   INTAKE / OUTPUT: Intake/Output      02/08 0701 - 02/09 0700 02/09 0701 - 02/10 0700   I.V. (mL/kg) 305.3 (4.1) 123.3 (1.7)   Other 280    NG/GT 30    IV Piggyback 700 448   TPN 3010 1080   Total Intake(mL/kg) 4325.3 (58.4) 1651.3 (22.3)   Urine (mL/kg/hr) 3120 (1.8) 1650 (2.1)   Emesis/NG output 100 (0.1)    Drains 2700 (1.5) 1600 (2.1)   Other 1653 (0.9)    Stool  0 (0)   Total Output 7573  3250   Net -3247.7 -1598.7        Stool Occurrence  1 x     PHYSICAL EXAMINATION: General: RASS 0 Neuro: no focal deficits HEENT: WNL Cardiovascular: Reg, no M Lungs: clear anteriorly Abdomen: Mildly distended, diminished BS, G tube site clean Ext: no edema  LABS: I have reviewed all of today's lab results. Relevant abnormalities are discussed in the A/P section  CXR: Nearly complete resolution of pulm infiltrates   ASSESSMENT / PLAN:  PULMONARY OETT 2/3 >>> A: Acute  hypoxic respiratory failure, resolved ARDS, resolved P:   Supp O2 as needed to maintain SpO2 > 92%  CARDIOVASCULAR A:  Septic Shock, resolved P:  MAP goal > 65 mmHg Cont monitoring  RENAL A:   AKI/ATN - now nonoliguric Hypervolemia, resolved Hypokalemia P:   Monitor BMET intermittently Monitor I/Os Correct electrolytes as indicated DC further furosemide and monitor Uo  GASTROINTESTINAL A:   End stage Crohn's of small and large bowel with prior fistula / chronic abd pain 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 Routine PEG tube care / maintenance. SUP: Pantoprazole Cont TPN per pharmacy Monitor LFTs intermittently  HEMATOLOGIC A:   Anemia of chronic disease Thrombocytopenia  P:  DVT px: SCDs Monitor CBC intermittently Transfuse per usual ICU guidelines  INFECTIOUS A:   Severe sepsis Persistent fungemia Suspected HCAP P:   Micro and abx as above ID service following - discussed with Dr Daiva Eves DC CVL 2/09 Try to manage without central venous access for a couple of days Will eventually need another PICC for long term TPN  ENDOCRINE A:   Chronic steroids, presumed adrenal insuff hyperglycemia P:   Cont to taper steroids as permitted by BP Cont SSI  NEUROLOGIC A:   Acute metabolic encephalopathy, resolved Chronic Pain, controlled P:   RASS goal: 0. Cont PRN fentanyl   Transfer to SDU. Remains on PCCM for now   Billy Fischer, MD ; [facility] [phone].  After 5:30 PM or weekends, call [phone]   01/22/2015, 5:23 PM

## 2015-01-22 NOTE — Evaluation (Signed)
Physical Therapy Evaluation Patient Details Name: David [name] MRN: [id number] DOB: 09/28/1976 Today's Date: 01/22/2015   History of Present Illness  39 y.o. M who presented to [facility] ED on 2/2 with 2 days of dyspnea, cough, fevers.  He was found to be hypoxic requiring NRB and CXR suggestive of HCAP/ARDS/ fungemia. Pt with Chron's awaiting small bowel transplant at [facility]  Clinical Impression  Pt pleasant but slow to respond with difficulty problem solving and mobilizing. Pt with assist for all transfers, balance and gait. Anticipate quick recovery with family assist. Pt will benefit from acute therapy to maximize mobility, function, gait and safety. If 24hr care then home is possible if wife cannot assist may need to consider CIR    Follow Up Recommendations Home health PT;Supervision for mobility/OOB    Equipment Recommendations  Rolling walker with 5" wheels;3in1 (PT)    Recommendations for Other Services       Precautions / Restrictions Precautions Precautions: Fall Precaution Comments: peg Restrictions Weight Bearing Restrictions: No      Mobility  Bed Mobility Overal bed mobility: Needs Assistance Bed Mobility: Rolling;Sidelying to Sit Rolling: Min assist Sidelying to sit: Min assist       General bed mobility comments: cues for sequence, assist and elevating trunk from surface  Transfers Overall transfer level: Needs assistance   Transfers: Sit to/from Stand;Stand Pivot Transfers Sit to Stand: Min assist Stand pivot transfers: Min assist       General transfer comment: cues for hand placement, sequence and safety. pt trying to sit before fully to surface  Ambulation/Gait Ambulation/Gait assistance: Min assist Ambulation Distance (Feet): 95 Feet Assistive device: Rolling walker (2 wheeled) Gait Pattern/deviations: Step-through pattern;Decreased stride length;Trunk flexed   Gait velocity interpretation: Below normal speed for age/gender General Gait Details:  cues for posture, looking up and stepping into RW  Stairs            Wheelchair Mobility    Modified Rankin (Stroke Patients Only)       Balance Overall balance assessment: Needs assistance   Sitting balance-Leahy Scale: Fair       Standing balance-Leahy Scale: Poor                               Pertinent Vitals/Pain Pain Assessment: 0-10 Pain Score: 4  Pain Location: abdomen Pain Descriptors / Indicators: Aching Pain Intervention(s): Premedicated before session;Repositioned    Home Living Family/patient expects to be discharged to:: Private residence Living Arrangements: Spouse/significant other Available Help at Discharge: Family;Available PRN/intermittently Type of Home: House Home Access: Stairs to enter Entrance Stairs-Rails: Right;Left;Can reach both Entrance Stairs-Number of Steps: 4 Home Layout: One level Home Equipment: Grab bars - toilet;Cane - single point      Prior Function Level of Independence: Independent         Comments: uses a cane outside at times, independent with ADLs     Hand Dominance        Extremity/Trunk Assessment   Upper Extremity Assessment: Generalized weakness           Lower Extremity Assessment: Generalized weakness      Cervical / Trunk Assessment: Normal  Communication   Communication: No difficulties  Cognition Arousal/Alertness: Awake/alert Behavior During Therapy: Flat affect Overall Cognitive Status: Impaired/Different from baseline Area of Impairment: Problem solving;Orientation Orientation Level: Time;Situation   Memory: Decreased short-term memory       Problem Solving: Slow processing;Decreased initiation;Requires verbal cues;Requires tactile  cues      General Comments      Exercises        Assessment/Plan    PT Assessment Patient needs continued PT services  PT Diagnosis Difficulty walking;Generalized weakness   PT Problem List Decreased strength;Decreased  activity tolerance;Decreased balance;Decreased mobility;Decreased knowledge of use of DME;Decreased cognition  PT Treatment Interventions Gait training;Stair training;Functional mobility training;Therapeutic activities;Therapeutic exercise;Balance training;Patient/family education;DME instruction   PT Goals (Current goals can be found in the Care Plan section) Acute Rehab PT Goals Patient Stated Goal: go home PT Goal Formulation: With patient Time For Goal Achievement: 02/05/15 Potential to Achieve Goals: Good    Frequency Min 3X/week   Barriers to discharge Decreased caregiver support pt reports wife can assist but she was not present to confirm    Co-evaluation               End of Session Equipment Utilized During Treatment: Gait belt Activity Tolerance: Patient tolerated treatment well Patient left: in chair;with call bell/phone within reach Nurse Communication: Mobility status         Time: 0812-0848 PT Time Calculation (min) (ACUTE ONLY): 36 min   Charges:   PT Evaluation $Initial PT Evaluation Tier I: 1 Procedure PT Treatments $Gait Training: 8-22 mins   PT G CodesDelorse [name] 01/22/2015, 10:22 AM David [name], PT [phone]

## 2015-01-22 NOTE — Progress Notes (Signed)
PARENTERAL NUTRITION CONSULT NOTE - FOLLOW UP  Pharmacy Consult:  TPN Indication:  End stage Crohn's / Chronic TPN as outpatient  Allergies  Allergen Reactions  . Remicade [Infliximab]     Dr Rehman states previous respiratory arrest not related to remicade. Dr Rehman states remicade not a drug related allergy. 07-07-2013 at 1025 rapid response called to PACU- Patient difficulty breathing after infusion of Remicade infusing. Do NOT Give Remicade!  . Fentanyl And Related Itching    Swelling, Redness Tolerated fentanyl 01/2015  . Alfentanil Itching    Swelling, Redness  . Wellbutrin [Bupropion] Nausea And Vomiting  . Morphine And Related Hives    Patient Measurements: Height: 5' 11" (180.3 cm) Weight: 163 lb 5.8 oz (74.1 kg) IBW/kg (Calculated) : 75.3 Dry weight: 81 kg  Vital Signs: Temp: 98.2 F (36.8 C) (02/09 0720) Temp Source: Oral (02/09 0720) BP: 99/54 mmHg (02/09 0700) Pulse Rate: 85 (02/09 0700) Intake/Output from previous day: 02/08 0701 - 02/09 0700 In: 4185.3 [I.V.:295.3; NG/GT:30; IV Piggyback:700; TPN:2880] Out: 7573 [Urine:3120; Emesis/NG output:100; Drains:2700]  Labs:  Recent Labs  01/20/15 0400 01/21/15 0349 01/22/15 0420  WBC 6.0 8.9 6.4  HGB 7.6* 8.4* 7.3*  HCT 23.6* 26.3* 22.4*  PLT 23* 26* 33*     Recent Labs  01/20/15 0400  01/21/15 0349 01/21/15 1640 01/22/15 0420  NA  --   < > 135 134* 136  K  --   < > 3.3* 3.9 3.0*  CL  --   < > 101 101 101  CO2  --   < > 28 28 27  GLUCOSE  --   < > 178* 183* 284*  BUN  --   < > 46* 40* 63*  CREATININE  --   < > 1.06 1.00 1.31  CALCIUM  --   < > 7.3* 7.4* 7.7*  MG 2.3  --  2.3  --  2.0  PHOS 2.9  < > 3.3 2.9 4.8*  PROT  --   --  5.3*  --   --   ALBUMIN  --   < > 2.3* 2.3* 2.1*  AST  --   --  60*  --   --   ALT  --   --  75*  --   --   ALKPHOS  --   --  178*  --   --   BILITOT  --   --  5.8*  --   --   PREALBUMIN  --   --  11.5*  --   --   TRIG  --   --  85  --   --   < > = values in this  interval not displayed. Estimated Creatinine Clearance: 80.1 mL/min (by C-G formula based on Cr of 1.31).    Recent Labs  01/20/15 1943 01/21/15 0034 01/21/15 0326  GLUCAP 249* 234* 168*     Insulin Requirements in the past 24 hours:  34 units resistant SSI + 30 units regular insulin in TPN  Assessment: 38 YOM with an extensive PMH and multiple admissions.  He has chronic, end-stage, Crohn's disease and PUD and is being evaluated for small bowel transplant at [facility].  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  Patient was admitted to [facility] in November 2015 for attempted ex-lap due to SBO.  He has been receiving TPN as outpatient.  GI: Not a surgical candidate d/t abundance of scar tissue. Cirrhosis secondary to MTX vs NASH. Ex-lap was   attempted during stay at [facility] in Nov 2015, but unable to gain access to abdomen for 2hr and procedure aborted >> placed decompressive G-tube instead.  Prealbumin < 3 indicating very poor nutritional status, now improved to 11.5 (was 13.9 in Jan).  NG O/P decreased to 200 mL, drains 2700 mL. On PPI IV Endo: TSH WNL.  DM - CBGs uncontrolled but appears to be improving with insulin added to TPN and steroid being tapered Lytes: getting TPN formula with lytes since 2/6 per discussion with Renal - K+ 3 (6 runs ordered), Phos mildly elevated at 4.8, Ca x Phos = 44, goal < 55) Renal: CVVHDF d/c'ed 2/8 - SCr up 1.31 - continues on Lasix with good UOP 1.8 ml/kg/hr Pulm: extubated to [place], now on RA - Duonebs Cards: BP soft without pressor, now tachycardic, CVP 0 (?) - Lasix (using A-line BP as more accurate) Hepatobil: AST/ALT decreased to 60/75, alk phos up to 178, tbili up to 5.8.  TG normalized (268 >> 85). Heme: hgb decreased to 7.3, plts improved to 33 Neuro: off sedation - GCS 14, CPOT 1-3 ID: Mycamine D#7 for fungemia (2/2 BCx grew yeast, 2/3 BCx grew C.glabrata and C.parapsilosis, 2/4 BCx still growing yeast, 2/8 BCx pending) + Vanc/Zosyn D#8 for  recurrent PNA + sepsis.  Need TEE given +BCx, need all lines removed.  Admission in Jan 2016 for PNA and grew pan-sensitive Pseduomonas from G-tube site. Need 2 wks of therapy after negative cultures once all lines have been removed.  Afebrile, WBC WNL TPN Access: CVC triple lumen left IJ - placed 2/4 TPN day#: continued from home  Current Nutrition:  Clinimix E 5/15 at 135m/hr + 20% IVFE at 144mhr on MWF = 144g protein/d and weekly average of 2230 kCal/d  Home TPN:  [facility]:  2150 kCal, 115 g protein per day (TPN included electrolytes and trace elements)  Nutritional Goals:  2100-2200 kCal, 110-120 grams of protein per day   Plan:  - Decrease Clinimix E 5/15 to 90 ml/hr + 20% lipids at 10 ml/hr, providing 2099 kCal and 114gm protein daily.  Reducing TPN rate to reduce protein provision as patient is now off CRRT. - Daily multivitamin - Trace elements MWF due to elevated tbili and documentation of jaundice - Continue resistant SSI + increase insulin in TPN to 50 units per bag - F/U CBGs and AM labs (may need to remove electrolytes from TPN)    Chapin Arduini D. DaMina MarblePharmD, BCPS Pager:  [phone]/08/2015, 11:03 AM

## 2015-01-22 NOTE — Progress Notes (Signed)
eLink Physician-Brief Progress Note Patient Name: David [name] DOB: 10/03/1976 MRN: [id number]   Date of Service  01/22/2015  HPI/Events of Note  Hypokalemia  eICU Interventions  Potassium replaced     Intervention Category Intermediate Interventions: Electrolyte abnormality - evaluation and management  Anjela Cassara 01/22/2015, 5:49 AM

## 2015-01-22 NOTE — Progress Notes (Signed)
  Echocardiogram 2D Echocardiogram with Definity has been performed.  Cathie Beams 01/22/2015, 12:31 PM

## 2015-01-23 DIAGNOSIS — J069 Acute upper respiratory infection, unspecified: Secondary | ICD-10-CM

## 2015-01-23 DIAGNOSIS — J690 Pneumonitis due to inhalation of food and vomit: Secondary | ICD-10-CM

## 2015-01-23 DIAGNOSIS — J9600 Acute respiratory failure, unspecified whether with hypoxia or hypercapnia: Secondary | ICD-10-CM

## 2015-01-23 LAB — GLUCOSE, CAPILLARY
GLUCOSE-CAPILLARY: 109 mg/dL — AB (ref 70–99)
GLUCOSE-CAPILLARY: 188 mg/dL — AB (ref 70–99)
Glucose-Capillary: 168 mg/dL — ABNORMAL HIGH (ref 70–99)
Glucose-Capillary: 198 mg/dL — ABNORMAL HIGH (ref 70–99)
Glucose-Capillary: 199 mg/dL — ABNORMAL HIGH (ref 70–99)
Glucose-Capillary: 155 mg/dL — ABNORMAL HIGH (ref 70–99)
Glucose-Capillary: 108 mg/dL — ABNORMAL HIGH (ref 70–99)
Glucose-Capillary: 123 mg/dL — ABNORMAL HIGH (ref 70–99)
Glucose-Capillary: 146 mg/dL — ABNORMAL HIGH (ref 70–99)

## 2015-01-23 LAB — CBC
HEMATOCRIT: 25.6 % — AB (ref 39.0–52.0)
Hemoglobin: 8.5 g/dL — ABNORMAL LOW (ref 13.0–17.0)
MCH: 25.1 pg — AB (ref 26.0–34.0)
MCHC: 33.2 g/dL (ref 30.0–36.0)
MCV: 75.7 fL — ABNORMAL LOW (ref 78.0–100.0)
PLATELETS: 53 10*3/uL — AB (ref 150–400)
RBC: 3.38 MIL/uL — AB (ref 4.22–5.81)
RDW: 20.5 % — ABNORMAL HIGH (ref 11.5–15.5)
WBC: 8.2 10*3/uL (ref 4.0–10.5)

## 2015-01-23 LAB — RENAL FUNCTION PANEL
ANION GAP: 14 (ref 5–15)
Albumin: 2.4 g/dL — ABNORMAL LOW (ref 3.5–5.2)
BUN: 87 mg/dL — ABNORMAL HIGH (ref 6–23)
CO2: 23 mmol/L (ref 19–32)
Calcium: 7.4 mg/dL — ABNORMAL LOW (ref 8.4–10.5)
Chloride: 103 mmol/L (ref 96–112)
Creatinine, Ser: 1.32 mg/dL (ref 0.50–1.35)
GFR calc non Af Amer: 67 mL/min — ABNORMAL LOW (ref >90)
GFR, EST AFRICAN AMERICAN: 78 mL/min — AB (ref >90)
GLUCOSE: 145 mg/dL — AB (ref 70–99)
POTASSIUM: 3.4 mmol/L — AB (ref 3.5–5.1)
Phosphorus: 6.5 mg/dL — ABNORMAL HIGH (ref 2.3–4.6)
SODIUM: 140 mmol/L (ref 135–145)

## 2015-01-23 LAB — MAGNESIUM: Magnesium: 2.1 mg/dL (ref 1.5–2.5)

## 2015-01-23 LAB — CULTURE, BLOOD (ROUTINE X 2): Culture: NO GROWTH

## 2015-01-23 MED ORDER — POTASSIUM CHLORIDE 10 MEQ/100ML IV SOLN
10.0000 meq | INTRAVENOUS | Status: AC
  Administered 2015-01-23: 10 meq via INTRAVENOUS
  Filled 2015-01-23: qty 100

## 2015-01-23 MED ORDER — POTASSIUM CHLORIDE 10 MEQ/50ML IV SOLN: 10.0000 meq | INTRAVENOUS | Status: DC

## 2015-01-23 MED ORDER — FENTANYL CITRATE 0.05 MG/ML IJ SOLN
INTRAMUSCULAR | Status: AC
  Filled 2015-01-23: qty 2

## 2015-01-23 NOTE — Progress Notes (Signed)
NUTRITION FOLLOW UP  Intervention:    Resume TPN as able.  Nutrition Dx:   Inadequate oral intake related to altered GI function and inability to eat as evidenced by NPO status. Ongoing.  Goal:   Intake to meet >90% of estimated nutrition needs. Unmet.  Monitor:   Ability to resume TPN, weight trend, labs.  Assessment:   Patient presented to [facility] on 2/2 with 2 days of dyspnea, cough, and fever. Transferred to [facility] on 2/3 with progressive respiratory failure requiring intubation. 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 Being evaluated at [facility] for small bowel transplant.  Patient was extubated on 2/8. CRRT off. PICC and IJ was removed on 2/8 due to suspected source of infection. TPN currently off.   Per discussion with patient, he was eating very little at home, received TPN for provision of nutrition needs.  Nutrition Focused Physical Exam:  Subcutaneous Fat:  Orbital Region: mild depletion Upper Arm Region: mild depletion Thoracic and Lumbar Region: NA  Muscle:  Temple Region: moderate depletion Clavicle Bone Region: severe depletion Clavicle and Acromion Bone Region: NA Scapular Bone Region: NA Dorsal Hand: mild depletion Patellar Region: moderate depletion Anterior Thigh Region: moderate depletion Posterior Calf Region: moderate depletion  Edema: none   Height: Ht Readings from Last 1 Encounters:  01/22/15  (1.803 m)    Weight Status:   Wt Readings from Last 1 Encounters:  01/23/15 161 lb 13.1 oz (73.4 kg)   01/17/15 196 lb 3.4 oz (89 kg)        Body mass index is 22.58 kg/(m^2).   Re-estimated needs:  Kcal: 2400-2600 Protein: 130-140 gm Fluid: >/= 2.4 L  Skin: stage 2 sacral pressure ulcer  Diet Order: Diet NPO time specified   Intake/Output Summary (Last 24 hours) at 01/23/15 1101 Last data filed at 01/23/15 0731  Gross per 24 hour  Intake 1578.33 ml  Output   5851 ml  Net  -4272.67 ml    Last BM: 2/9   Labs:   Recent Labs Lab 01/21/15 0349 01/21/15 1640 01/22/15 0420 01/23/15 0237  NA 135 134* 136 140  K 3.3* 3.9 3.0* 3.4*  CL 101 101 101 103  CO2 BUN 46* 40* 63* 87*  CREATININE 1.06 1.00 1.31 1.32  CALCIUM 7.3* 7.4* 7.7* 7.4*  MG 2.3  --  2.0 2.1  PHOS 3.3 2.9 4.8* 6.5*  GLUCOSE 178* 183* 284* 145*    CBG (last 3)   Recent Labs  01/22/15 2346 01/23/15 0409 01/23/15 0733  GLUCAP 95 155* 109*    Scheduled Meds: . chlorhexidine  15 mL Mouth Rinse BID  . hydrocortisone sod succinate (SOLU-CORTEF) inj  25 mg Intravenous Q12H  . insulin aspart  0-20 Units Subcutaneous 6 times per day  . micafungin (MYCAMINE) IV  100 mg Intravenous QHS  . pantoprazole (PROTONIX) IV  40 mg Intravenous Q12H    Continuous Infusions: . dextrose 5 % and 0.45 % NaCl with KCl 20 mEq/L 50 mL/hr at 01/23/15 0902     Joaquin Courts, RD, LDN, CNSC Pager [phone] After Hours Pager [phone]

## 2015-01-23 NOTE — Progress Notes (Signed)
Pt.'s foley cath D/C'd at 3:00pm, no complications, tolerated well.  Due to void, urinal within reach.

## 2015-01-23 NOTE — Progress Notes (Signed)
PULMONARY / CRITICAL CARE MEDICINE   Name: David [name] MRN: [id number] DOB: October 09, 1976    ADMISSION DATE:  01/15/2015 CONSULTATION DATE:  01/23/2015  REFERRING MD :  [facility]  CHIEF COMPLAINT:  Dyspnea  INITIAL PRESENTATION:  39 y.o. M who presented to [facility] ED on 2/2 with 2 days of dyspnea, cough, fevers.  He was found to be hypoxic requiring NRB and CXR suggestive of HCAP/ARDS/ fungemia Maintained on TNA via RUE PICC. Of note, he has Crohn's of the small and large bowel and is awaiting small bowel transplantation at [facility].  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   MAJOR EVENTS/TEST RESULTS: 2/02 - admitted to [facility] 2/03 - intubated, transferred to [facility] ICU.  Started on ARDS protocol 2/03 Renal consult: CRRT initiated for mgmt of severe acidosis and hyperkalemia 2/03 Gen surg consult: CT abdomen ordered. Noted that pt was difficult surgery @ [facility] 2/04 CTAP: Diffuse abnormal appearance of the bowel with multifocal bowel wall thick in. There is probable pneumatosis involving small bowel in the lower abdomen. No definite perforation. There is increased mesenteric edema and free fluid from prior exam. Decreased hydronephrosis. Splenomegaly. Worsening airspace disease in the lower lungs, left greater than Right 2/04 ID consult for fungemia: Micafungin added 2/07 Ventilator dyssynchrony with appearance of proximal airway obstruction. Improved with suctioning. Off vasopressors. Agitation difficult to control.  2/08 Passed SBT. Remains agitated. Extubated and tolerating. Weaning vasopressors to off. Persistent fungemia on F/U BCs. ID service recommends echocardiogram and change of indwelling catheters/lines. Uo picking up with furosemide. CRRT continued.  2/09 TTE: The estimated ejection fraction was 65%. Wall motion was normal; there were no regional wall motion  abnormalities. Slight dilitation of aortic root. No obvious vegetations seen 2/09 Transfer to SDU ordered   INDWELLING DEVICES:: ETT 2/03 >> 2/08 R femoral A-line 2/03 >> 2/08 R IJ HD cath 2/04 >> 2/08 L IJ CVL 2/04 >> 2/09   MICRO DATA: MRSA PCR 2/02 >> NEG Urine 2/04 >> NEG Resp 2/03 >> NEG Blood 2/02 >> 2/2 yeast Blood 2/03 >> 2/2 candida glabrata Blood 2/04 >> 1/2 yeast Resp 2/04 >> NOF Blood 2/08 >>   ANTIMICROBIALS:  Clinda 2/02 >> 2/02 Cefepime 2/02 >> 2/02 Vanc 2/02 >> 2/09 Pip-tazo 2/02 >> 2/09 Micafungin 2/03 >>    SUBJECTIVE:  Neg balance 3.4 liters  VITAL SIGNS: Temp:  [98.1 F (36.7 C)-98.9 F (37.2 C)] 98.9 F (37.2 C) (02/10 1103) Pulse Rate:  [80-102] 96 (02/10 1116) Resp:  [14-22] 20 (02/10 1116) BP: (101-118)/(62-68) 108/62 mmHg (02/10 1100) SpO2:  [94 %-100 %] 94 % (02/10 1116) Weight:  [161 lb 13.1 oz (73.4 kg)-165 lb 5.5 oz (75 kg)] 161 lb 13.1 oz (73.4 kg) (02/10 0340) HEMODYNAMICS:   VENTILATOR SETTINGS:   INTAKE / OUTPUT: Intake/Output      02/09 0701 - 02/10 0700 02/10 0701 - 02/11 0700   I.V. (mL/kg) 773.3 (10.5) 300 (4.1)   Other     NG/GT     IV Piggyback 548    TPN 1115    Total Intake(mL/kg) 2436.3 (33.2) 300 (4.1)   Urine (mL/kg/hr) 2875 (1.6) 825 (1.4)   Emesis/NG output     Drains 3150 (1.8) 600 (1)   Other     Stool 1 (0)    Total Output 6026 1425   Net -3589.7 -1125  Stool Occurrence 1 x      PHYSICAL EXAMINATION: General: RASS 1 , int agitation Neuro: no focal deficits HEENT: WNL Cardiovascular: Reg, no M Lungs: improved daily Abdomen: Mildly distended, diminished BS, G tube site clean, no changes Ext: no edema  LABS: I have reviewed all of today's lab results. Relevant abnormalities are discussed in the A/P section  CXR: Nearly complete resolution of pulm infiltrates, improved daily   ASSESSMENT / PLAN:  PULMONARY OETT 2/3 >>> A: Acute hypoxic respiratory failure, resolved ARDS,  resolved P:   Supp O2 as needed to maintain SpO2 > 92% getting better daily with neg balance likely have maximized lasix  CARDIOVASCULAR A:  Septic Shock, resolved P:  MAP goal > 60 mmHg Cont monitoring off lasix  RENAL A:   AKI/ATN - now nonoliguric Hypokalemia over diuresis likely? 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 Routine PEG tube care / maintenance. SUP: Pantoprazole Cont TPN per pharmacy Monitor LFTs intermittently  HEMATOLOGIC A:   Anemia of chronic disease Thrombocytopenia (dilutional, sirs) hemoconcentration 2/10 P:  DVT px: SCDs With neg balance seen today , likely will continue to increase plat  INFECTIOUS A:   Severe sepsis Persistent fungemia Suspected HCAP P:   Micro and abx as above ID following  Line holiday Will eventually need another PICC for long term TPN  ENDOCRINE A:   Chronic steroids, presumed adrenal insuff hyperglycemia P:   Cont to taper steroids as permitted by BP Cont SSI  NEUROLOGIC A:   Acute metabolic encephalopathy, resolved Chronic Pain, controlled P:   RASS goal: 0. Cont PRN fentanyl   To triad  Mcarthur Rossetti. Tyson Alias, MD, FACP Pgr: [phone] [place] [facility] 01/23/2015 3:29 PM

## 2015-01-23 NOTE — Progress Notes (Signed)
[facility]       Subjective:  tired   Antibiotics:  Anti-infectives    Start     Dose/Rate Route Frequency Ordered Stop   01/22/15 0200  piperacillin-tazobactam (ZOSYN) IVPB 3.375 g  Status:  Discontinued     3.375 g 12.5 mL/hr over 240 Minutes Intravenous 3 times per day 01/22/15 0013 01/22/15 1036   01/20/15 1800  vancomycin (VANCOCIN) IVPB 750 mg/150 ml premix  Status:  Discontinued     750 mg 150 mL/hr over 60 Minutes Intravenous Every 12 hours 01/20/15 1753 01/22/15 1036   01/17/15 1800  vancomycin (VANCOCIN) IVPB 1000 mg/200 mL premix  Status:  Discontinued     1,000 mg 200 mL/hr over 60 Minutes Intravenous Every 24 hours 01/17/15 0140 01/20/15 1753   01/17/15 0600  piperacillin-tazobactam (ZOSYN) IVPB 3.375 g  Status:  Discontinued     3.375 g 100 mL/hr over 30 Minutes Intravenous 4 times per day 01/17/15 0140 01/22/15 0013   01/16/15 2300  micafungin (MYCAMINE) 100 mg in sodium chloride 0.9 % 100 mL IVPB     100 mg 100 mL/hr over 1 Hours Intravenous Daily at bedtime 01/16/15 2140     01/16/15 2200  piperacillin-tazobactam (ZOSYN) IVPB 3.375 g  Status:  Discontinued     3.375 g 12.5 mL/hr over 240 Minutes Intravenous 3 times per day 01/16/15 1733 01/17/15 0133   01/16/15 1800  vancomycin (VANCOCIN) IVPB 1000 mg/200 mL premix  Status:  Discontinued     1,000 mg 200 mL/hr over 60 Minutes Intravenous Every 8 hours 01/16/15 1733 01/17/15 0134   01/16/15 0400  vancomycin (VANCOCIN) IVPB 1000 mg/200 mL premix  Status:  Discontinued     1,000 mg 200 mL/hr over 60 Minutes Intravenous Every 12 hours 01/15/15 1836 01/16/15 1710   01/15/15 2200  piperacillin-tazobactam (ZOSYN) IVPB 3.375 g  Status:  Discontinued     3.375 g 12.5 mL/hr over 240 Minutes Intravenous 3 times per day 01/15/15 1853 01/16/15 1710   01/15/15 1600  vancomycin (VANCOCIN) IVPB 1000 mg/200 mL premix     1,000 mg 200 mL/hr over 60 Minutes Intravenous  Once 01/15/15 1557  01/15/15 1754   01/15/15 1500  ceFEPIme (MAXIPIME) 2 g in dextrose 5 % 50 mL IVPB     2 g 100 mL/hr over 30 Minutes Intravenous  Once 01/15/15 1458 01/15/15 1540   01/15/15 1500  clindamycin (CLEOCIN) IVPB 600 mg     600 mg 100 mL/hr over 30 Minutes Intravenous  Once 01/15/15 1458 01/15/15 1537      Medications: Scheduled Meds: . chlorhexidine  15 mL Mouth Rinse BID  . hydrocortisone sod succinate (SOLU-CORTEF) inj  25 mg Intravenous Q12H  . insulin aspart  0-20 Units Subcutaneous 6 times per day  . micafungin (MYCAMINE) IV  100 mg Intravenous QHS  . pantoprazole (PROTONIX) IV  40 mg Intravenous Q12H  . potassium chloride  10 mEq Intravenous Q1 Hr x 2   Continuous Infusions: . dextrose 5 % and 0.45 % NaCl with KCl 20 mEq/L 50 mL/hr at 01/23/15 1200   PRN Meds:.sodium chloride, albuterol, fentaNYL    Objective: Weight change: 1 lb 15.8 oz (0.9 kg)  Intake/Output Summary (Last 24 hours) at 01/23/15 1754 Last data filed at 01/23/15 1425  Gross per 24 hour  Intake   1050 ml  Output   4201 ml  Net  -3151 ml   Blood pressure 108/62, pulse 96, temperature 99.1 F (37.3 C),  temperature source Oral, resp. rate 20, height  (1.803 m), weight 161 lb 13.1 oz (73.4 kg), SpO2 94 %. Temp:  [98.1 F (36.7 C)-99.1 F (37.3 C)] 99.1 F (37.3 C) (02/10 1600) Pulse Rate:  [80-102] 96 (02/10 1116) Resp:  [14-22] 20 (02/10 1116) BP: (101-118)/(62-68) 108/62 mmHg (02/10 1100) SpO2:  [94 %-100 %] 94 % (02/10 1116) Weight:  [161 lb 13.1 oz (73.4 kg)-165 lb 5.5 oz (75 kg)] 161 lb 13.1 oz (73.4 kg) (02/10 0340)  Physical Exam: General: Alert oriented conversant oriented x 3 HEENT: Normocephalic,  CVS regular rate, normal r, no murmur rubs or gallops Chest:rhonchi Abdomen: soft, bandage in place.  Extremities: 2+ edema Neuro: nonfocal   CBC:   CBC Latest Ref Rng 01/23/2015 01/22/2015 01/21/2015  WBC 4.0 - 10.5 K/uL 8.2 6.4 8.9  Hemoglobin 13.0 - 17.0 g/dL 1.6(X) 7.3(L) 8.4(L)    Hematocrit 39.0 - 52.0 % 25.6(L) 22.4(L) 26.3(L)  Platelets 150 - 400 K/uL 53(L) 33(L) 26(LL)       BMET  Recent Labs  01/22/15 0420 01/23/15 0237  NA 136 140  K 3.0* 3.4*  CL 101 103  CO2 27 23  GLUCOSE 284* 145*  BUN 63* 87*  CREATININE 1.31 1.32  CALCIUM 7.7* 7.4*     Liver Panel   Recent Labs  01/21/15 0349  01/22/15 0420 01/23/15 0237  PROT 5.3*  --   --   --   ALBUMIN 2.3*  < > 2.1* 2.4*  AST 60*  --   --   --   ALT 75*  --   --   --   ALKPHOS 178*  --   --   --   BILITOT 5.8*  --   --   --   < > = values in this interval not displayed.     Sedimentation Rate No results for input(s): ESRSEDRATE in the last 72 hours. C-Reactive Protein No results for input(s): CRP in the last 72 hours.  Micro Results: Recent Results (from the past 720 hour(s))  Clostridium Difficile by PCR     Status: None   Collection Time: 12/26/14 10:16 PM  Result Value Ref Range Status   C difficile by pcr NEGATIVE NEGATIVE Final  Wound culture     Status: None   Collection Time: 12/31/14 12:54 AM  Result Value Ref Range Status   Specimen Description WOUND ABDOMEN  Final   Special Requests IMMUNE:COMPROMISED  Final   Gram Stain   Final    RARE WBC PRESENT, PREDOMINANTLY MONONUCLEAR NO SQUAMOUS EPITHELIAL CELLS SEEN RARE GRAM POSITIVE COCCI IN PAIRS IN CLUSTERS FEW YEAST Performed at [facility]    Culture   Final    MULTIPLE ORGANISMS PRESENT, NONE PREDOMINANT Note: NO GROUP A STREP (S.PYOGENES) ISOLATED NO STAPHYLOCOCCUS AUREUS ISOLATED Performed at [facility]    Report Status 01/03/2015 FINAL  Final  Blood culture (routine x 2)     Status: None   Collection Time: 01/15/15  3:07 PM  Result Value Ref Range Status   Specimen Description BLOOD LEFT ANTECUBITAL  Final   Special Requests BOTTLES DRAWN AEROBIC AND ANAEROBIC [facility]  Final   Culture   Final    CANDIDA GLABRATA CANDIDA PARAPSILOSIS Note: Gram Stain Report Called to,Read Back By and  Verified With: Armida Sans 1803 01/16/15 BY Ginette Pitman Performed at [facility] Performed at [facility]    Report Status 01/22/2015 FINAL  Final  Blood culture (routine x 2)  Status: None   Collection Time: 01/15/15  3:12 PM  Result Value Ref Range Status   Specimen Description BLOOD LEFT ARM  Final   Special Requests BOTTLES DRAWN AEROBIC AND ANAEROBIC 8CC  Final   Culture   Final    CANDIDA GLABRATA CANDIDA PARAPSILOSIS Note: Gram Stain Report Called to,Read Back By and Verified With: MEREDITH Casad 1803 01/16/15 BY Ginette Pitman Performed at [facility] Performed at [facility]    Report Status 01/22/2015 FINAL  Final  MRSA PCR Screening     Status: None   Collection Time: 01/15/15  6:33 PM  Result Value Ref Range Status   MRSA by PCR NEGATIVE NEGATIVE Final    Comment:        The GeneXpert MRSA Assay (FDA approved for NASAL specimens only), is one component of a comprehensive MRSA colonization surveillance program. It is not intended to diagnose MRSA infection nor to guide or monitor treatment for MRSA infections.   Culture, respiratory (tracheal aspirate)     Status: None   Collection Time: 01/16/15  5:46 PM  Result Value Ref Range Status   Specimen Description TRACHEAL ASPIRATE  Final   Special Requests Normal  Final   Gram Stain   Final    FEW WBC PRESENT, PREDOMINANTLY PMN NO SQUAMOUS EPITHELIAL CELLS SEEN NO ORGANISMS SEEN Performed at [facility]    Culture   Final    NO GROWTH 2 DAYS Performed at [facility]    Report Status 01/19/2015 FINAL  Final  Culture, blood (routine x 2)     Status: None   Collection Time: 01/16/15  6:30 PM  Result Value Ref Range Status   Specimen Description BLOOD LEFT ARM  Final   Special Requests BOTTLES DRAWN AEROBIC AND ANAEROBIC 5CC  Final   Culture   Final    CANDIDA PARAPSILOSIS Note: Gram Stain Report Called to,Read Back By and Verified With: PHYLLIS MCKINNEY ON  2.5.2016 AT 7:30P BY WILEJ Performed at [facility]    Report Status 01/21/2015 FINAL  Final  Culture, blood (routine x 2)     Status: None   Collection Time: 01/16/15  6:45 PM  Result Value Ref Range Status   Specimen Description BLOOD LEFT HAND  Final   Special Requests BOTTLES DRAWN AEROBIC AND ANAEROBIC 5CC  Final   Culture   Final    CANDIDA GLABRATA Note: Gram Stain Report Called to,Read Back By and Verified With: CHRISTINE MATHERLY 01/19/15 0455A [facility] Performed at [facility]    Report Status 01/21/2015 FINAL  Final  Culture, respiratory (NON-Expectorated)     Status: None   Collection Time: 01/17/15  8:40 AM  Result Value Ref Range Status   Specimen Description TRACHEAL SITE  Final   Special Requests NONE  Final   Gram Stain   Final    FEW WBC PRESENT,BOTH PMN AND MONONUCLEAR RARE SQUAMOUS EPITHELIAL CELLS PRESENT NO ORGANISMS SEEN Performed at [facility]    Culture   Final    Non-Pathogenic Oropharyngeal-type Flora Isolated. Performed at [facility]    Report Status 01/20/2015 FINAL  Final  Culture, blood (routine x 2)     Status: None   Collection Time: 01/17/15  1:13 PM  Result Value Ref Range Status   Specimen Description BLOOD LEFT HAND  Final   Special Requests BOTTLES DRAWN AEROBIC ONLY 7.5CC  Final   Culture   Final    NO GROWTH 5 DAYS Performed  at [facility]    Report Status 01/23/2015 FINAL  Final  Culture, blood (routine x 2)     Status: None   Collection Time: 01/17/15  6:50 PM  Result Value Ref Range Status   Specimen Description BLOOD LEFT HAND  Final   Special Requests   Final    BOTTLES DRAWN AEROBIC AND ANAEROBIC 5CC AER,2CC ANA   Culture   Final    CANDIDA PARAPSILOSIS Note: Gram Stain Report Called to,Read Back By and Verified With: P PATEL RN 01/20/15 AT 1020 AM BY [facility] Performed at [facility]    Report Status 01/23/2015 FINAL  Final  Urine culture     Status: None   Collection  Time: 01/17/15  8:42 PM  Result Value Ref Range Status   Specimen Description URINE, CATHETERIZED  Final   Special Requests Normal  Final   Colony Count NO GROWTH Performed at [facility]   Final   Culture NO GROWTH Performed at [facility]   Final   Report Status 01/19/2015 FINAL  Final  Culture, blood (routine x 2)     Status: None (Preliminary result)   Collection Time: 01/21/15 12:57 PM  Result Value Ref Range Status   Specimen Description BLOOD RIGHT ANTECUBITAL  Final   Special Requests BOTTLES DRAWN AEROBIC ONLY 6CC  Final   Culture   Final           BLOOD CULTURE RECEIVED NO GROWTH TO DATE CULTURE WILL BE HELD FOR 5 DAYS BEFORE ISSUING A FINAL NEGATIVE REPORT Performed at [facility]    Report Status PENDING  Incomplete  Culture, blood (routine x 2)     Status: None (Preliminary result)   Collection Time: 01/21/15  2:07 PM  Result Value Ref Range Status   Specimen Description BLOOD RIGHT ARM  Final   Special Requests BOTTLES DRAWN AEROBIC AND ANAEROBIC 5CC  Final   Culture   Final           BLOOD CULTURE RECEIVED NO GROWTH TO DATE CULTURE WILL BE HELD FOR 5 DAYS BEFORE ISSUING A FINAL NEGATIVE REPORT Performed at [facility]    Report Status PENDING  Incomplete    Studies/Results: Dg Chest Port 1 View  01/22/2015   CLINICAL DATA:  Respiratory failure.  EXAM: PORTABLE CHEST - 1 VIEW  COMPARISON:  01/20/2015.  FINDINGS: Interim removal of endotracheal tube, NG tube, and right IJ sheath. Left IJ line in stable position. Mediastinum and hilar structures are normal. Heart size normal. Interim near complete clearing of bilateral pulmonary infiltrates with mild residual in right upper lobe. Stable elevation left hemidiaphragm. No pleural effusion or pneumothorax identified.  IMPRESSION: 1. Interim removal of tracheostomy tube, NG tube, right IJ sheath. Left IJ line in stable position. 2. Interim near-complete clearing of bilateral pulmonary  infiltrates with mild residual right upper lobe. Stable elevation left hemidiaphragm.   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   Hypertension   Liver cirrhosis   Protein-calorie malnutrition, severe   Aspiration pneumonia   Anemia of chronic disease   Acute kidney injury   Acute respiratory failure with hypoxia   Thrombocytopenia   Pneumonia   Endotracheally intubated   Septic shock   Acute respiratory failure   ARDS (adult respiratory distress  syndrome)   Fungemia   Candida glabrata infection   Candida parapsilosis infection   Candidemia   Hemodialysis catheter infection   Central line infection   Acute respiratory disease   Bilateral pneumonia   Severe sepsis   AKI (acute kidney injury)    David [name] is a 39 y.o. male with multiple medical problems cirrhosis Crohn's disease on TPN via PICC at home admitted with apparent healthcare associated pneumonia sepsis +/- intrabdominal infection now found to be fungemic with C glabrata and C parapsilosis  #1 Persistently positive candidemia despite removal of his PICC line and initiation of micafungin. His hemodialysis catheter femoral arterial line and internal jugular vein line are all undoubtedly seeded with Candida and responsible for his persistent candidemia.  ALL LINES ARE OUT! --REPEAT BLOOD CULTURES ONCE LINES ALL OUT (today)  --would give  "HOLIDAY FROM TPN AND CENTRAL LINE" if possible thru 01/25/15 WITH NEGATIVE repeat blood cultures  --Ophthalmology should be consult for formal funduscopic exam   --he DEFINITELY NEEDS TO STAY on MICAFUNGIN given C glabrata  --2D echo was negative but I would also get a TEE during his stay  given persistently positive cultures     #2 healthcare associated pneumonia: Finished vancomycin  and Zosyn and these are reasonable,. Fine to DC them today  #3 ? Pneumatosis: not felt to have perforation. Followed closely by CCS and CCM      LOS: 8 days   Acey Lav 01/23/2015, 5:54 PM

## 2015-01-23 NOTE — Progress Notes (Signed)
Admit: 01/15/2015 LOS: 8  25M w/ IBD/CD, Cirrhosis, presents with ARDS, Fungemia, Septic Shock, and with AKI req CRRT.    Subjective:  Moved to SDU [place] with PT Good UOP BUN and Phos up, SCr stable Weight Stable   02/09 0701 - 02/10 0700 In: 2436.3 [I.V.:773.3; IV Piggyback:548; TPN:1115] Out: 6026 [Urine:2875; Drains:3150; Stool:1]  Filed Weights   01/22/15 0500 01/22/15 2018 01/23/15 0340  Weight: 74.1 kg (163 lb 5.8 oz) 75 kg (165 lb 5.5 oz) 73.4 kg (161 lb 13.1 oz)    Scheduled Meds: . chlorhexidine  15 mL Mouth Rinse BID  . hydrocortisone sod succinate (SOLU-CORTEF) inj  25 mg Intravenous Q12H  . insulin aspart  0-20 Units Subcutaneous 6 times per day  . micafungin (MYCAMINE) IV  100 mg Intravenous QHS  . pantoprazole (PROTONIX) IV  40 mg Intravenous Q12H   Continuous Infusions: . dextrose 5 % and 0.45 % NaCl with KCl 20 mEq/L 50 mL/hr at 01/23/15 1200   PRN Meds:.sodium chloride, albuterol, fentaNYL  Current Labs: reviewed    Physical Exam:  Blood pressure 108/62, pulse 96, temperature 98.9 F (37.2 C), temperature source Oral, resp. rate 20, height 5\' 11"  (1.803 m), weight 73.4 kg (161 lb 13.1 oz), SpO2 94 %. Thin frail appearing RRR CTAB, diminshed in bases No LEE Foley in place Nonfocal EOMI  A/P  1. Dialysis dependent AKI 1. Nl baseline GFR 2. CRRT stopped 01/21/15 3. Excellent UOP, follow labs  4. Appears to have recovered, cont to watch with climbinb BUN 5. No Vascular access currently 2/2 candidemia 2. Hypervolemia, improving 1. Good UOP w/o any diuretic, likely some solute diuresis 3. Septic Shock 2/2 Fungemia RCID and CCM managing 4. Fungemia (C glabrata) on Micafungin, RCID/CCM following 5. IBD/Chrons 1. On Home TPN 2. Short Gut / Frozen 3. On list for SB tplt at [facility] 6. ESLD 7. ARDS per CCM 1. Extubated 01/21/15  Sabra Heck MD 01/23/2015, 1:43 PM   Recent Labs Lab 01/21/15 1640 01/22/15 0420 01/23/15 0237  NA 134* 136 140  K  3.9 3.0* 3.4*  CL 101 101 103  CO2 28 27 23   GLUCOSE 183* 284* 145*  BUN 40* 63* 87*  CREATININE 1.00 1.31 1.32  CALCIUM 7.4* 7.7* 7.4*  PHOS 2.9 4.8* 6.5*    Recent Labs Lab 01/21/15 0349 01/22/15 0420 01/23/15 0237  WBC 8.9 6.4 8.2  NEUTROABS 7.0  --   --   HGB 8.4* 7.3* 8.5*  HCT 26.3* 22.4* 25.6*  MCV 79.9 78.0 75.7*  PLT 26* 33* 53*

## 2015-01-24 DIAGNOSIS — K50918 Crohn's disease, unspecified, with other complication: Secondary | ICD-10-CM

## 2015-01-24 DIAGNOSIS — T827XXA Infection and inflammatory reaction due to other cardiac and vascular devices, implants and grafts, initial encounter: Secondary | ICD-10-CM

## 2015-01-24 DIAGNOSIS — K3184 Gastroparesis: Secondary | ICD-10-CM

## 2015-01-24 DIAGNOSIS — N179 Acute kidney failure, unspecified: Secondary | ICD-10-CM | POA: Diagnosis present

## 2015-01-24 LAB — GLUCOSE, CAPILLARY
GLUCOSE-CAPILLARY: 180 mg/dL — AB (ref 70–99)
Glucose-Capillary: 108 mg/dL — ABNORMAL HIGH (ref 70–99)
Glucose-Capillary: 114 mg/dL — ABNORMAL HIGH (ref 70–99)
Glucose-Capillary: 145 mg/dL — ABNORMAL HIGH (ref 70–99)
Glucose-Capillary: 157 mg/dL — ABNORMAL HIGH (ref 70–99)
Glucose-Capillary: 167 mg/dL — ABNORMAL HIGH (ref 70–99)
Glucose-Capillary: 99 mg/dL (ref 70–99)

## 2015-01-24 LAB — CBC
HEMATOCRIT: 27.6 % — AB (ref 39.0–52.0)
Hemoglobin: 9.2 g/dL — ABNORMAL LOW (ref 13.0–17.0)
MCH: 25.3 pg — AB (ref 26.0–34.0)
MCHC: 33.3 g/dL (ref 30.0–36.0)
MCV: 76 fL — AB (ref 78.0–100.0)
PLATELETS: 99 10*3/uL — AB (ref 150–400)
RBC: 3.63 MIL/uL — AB (ref 4.22–5.81)
RDW: 20.8 % — ABNORMAL HIGH (ref 11.5–15.5)
WBC: 9.7 10*3/uL (ref 4.0–10.5)

## 2015-01-24 LAB — BASIC METABOLIC PANEL
ANION GAP: 9 (ref 5–15)
BUN: 84 mg/dL — ABNORMAL HIGH (ref 6–23)
CHLORIDE: 102 mmol/L (ref 96–112)
CO2: 27 mmol/L (ref 19–32)
Calcium: 7.2 mg/dL — ABNORMAL LOW (ref 8.4–10.5)
Creatinine, Ser: 1.36 mg/dL — ABNORMAL HIGH (ref 0.50–1.35)
GFR calc Af Amer: 75 mL/min — ABNORMAL LOW (ref >90)
GFR calc non Af Amer: 65 mL/min — ABNORMAL LOW (ref >90)
Glucose, Bld: 146 mg/dL — ABNORMAL HIGH (ref 70–99)
Potassium: 3.5 mmol/L (ref 3.5–5.1)
SODIUM: 138 mmol/L (ref 135–145)

## 2015-01-24 LAB — MAGNESIUM: Magnesium: 2.4 mg/dL (ref 1.5–2.5)

## 2015-01-24 MED ORDER — SODIUM CHLORIDE 0.9 % IV SOLN
INTRAVENOUS | Status: DC
  Administered 2015-01-24 – 2015-01-27 (×3): via INTRAVENOUS

## 2015-01-24 MED ORDER — LACTATED RINGERS IV SOLN: INTRAVENOUS | Status: DC

## 2015-01-24 NOTE — Progress Notes (Signed)
Utilization review completed.  

## 2015-01-24 NOTE — Progress Notes (Signed)
Notified Dr. Joseph Art of pts 5 beat run Vtach and 7 beat run Vtach separated by 1 NS beat at 1408. Awaiting call back.

## 2015-01-24 NOTE — Progress Notes (Signed)
Admit: 01/15/2015 LOS: 9  17M w/ IBD/CD, Cirrhosis, presents with ARDS, Fungemia, Septic Shock, and with AKI req CRRT.    Subjective:  Stable SCr and BUN Good UOP, nearly 5L net negative yesterda including GI drains   02/10 0701 - 02/11 0700 In: 950 [I.V.:850; IV Piggyback:100] Out: 5775 [Urine:2100; Drains:3675]  Filed Weights   01/22/15 2018 01/23/15 0340 01/24/15 0327  Weight: 75 kg (165 lb 5.5 oz) 73.4 kg (161 lb 13.1 oz) 70.3 kg (154 lb 15.7 oz)    Scheduled Meds: . chlorhexidine  15 mL Mouth Rinse BID  . hydrocortisone sod succinate (SOLU-CORTEF) inj  25 mg Intravenous Q12H  . insulin aspart  0-20 Units Subcutaneous 6 times per day  . micafungin (MYCAMINE) IV  100 mg Intravenous QHS  . pantoprazole (PROTONIX) IV  40 mg Intravenous Q12H   Continuous Infusions: . dextrose 5 % and 0.45 % NaCl with KCl 20 mEq/L 50 mL/hr at 01/23/15 2100   PRN Meds:.sodium chloride, albuterol, fentaNYL  Current Labs: reviewed    Physical Exam:  Blood pressure 110/74, pulse 78, temperature 98.3 F (36.8 C), temperature source Oral, resp. rate 19, height  (1.803 m), weight 70.3 kg (154 lb 15.7 oz), SpO2 99 %. Thin frail appearing RRR CTAB, diminshed in bases No LEE Foley in place Nonfocal EOMI  A/P  1. Dialysis dependent AKI 1. Nl baseline GFR 2. CRRT stopped 01/21/15 3. Excellent UOP, stable SCr 2d after stopping 4. Appears to have recovered 5. Having O >>> I from GI and urine source and not rec TPN 6. Start NS @ 75/hr today; stop hypotonic IVFs 7. No central vascular access currently 2/2 candidemia 2. Hypervolemia, improving 1. Good UOP w/o any diuretic, likely some solute diuresis 2. As per #1 3. Septic Shock 2/2 Fungemia RCID and CCM managing 4. Fungemia (C glabrata) on Micafungin, RCID/CCM following 5. IBD/Chrons 1. On Home TPN 2. Short Gut / Frozen 3. On list for SB tplt at [facility] 6. ESLD 7. ARDS per CCM 1. Extubated 01/21/15  Will sign off for now.  Please call  with any questions or concerns.    Sabra Heck MD 01/24/2015, 9:24 AM   Recent Labs Lab 01/21/15 1640 01/22/15 0420 01/23/15 0237 01/24/15 0318  NA 134* 136 140 138  K 3.9 3.0* 3.4* 3.5  CL 101 101 103 102  CO2 GLUCOSE 183* 284* 145* 146*  BUN 40* 63* 87* 84*  CREATININE 1.00 1.31 1.32 1.36*  CALCIUM 7.4* 7.7* 7.4* 7.2*  PHOS 2.9 4.8* 6.5*  --     Recent Labs Lab 01/21/15 0349 01/22/15 0420 01/23/15 0237 01/24/15 0318  WBC 8.9 6.4 8.2 9.7  NEUTROABS 7.0  --   --   --   HGB 8.4* 7.3* 8.5* 9.2*  HCT 26.3* 22.4* 25.6* 27.6*  MCV 79.9 78.0 75.7* 76.0*  PLT 26* 33* 53* 99*

## 2015-01-24 NOTE — Progress Notes (Signed)
PT Cancellation Note  Patient Details Name: TEODOR ANZURES MRN: [id number] DOB: 08-03-1976   Cancelled Treatment:    Reason Eval/Treat Not Completed: Patient declined, no reason specified.  Pt states he's already been up this morning and declined mobility at this time.  Will f/u another time.     Juanna Pudlo, Alison Murray 01/24/2015, 9:36 AM

## 2015-01-24 NOTE — Progress Notes (Signed)
[facility] TEAM 1 - Stepdown/ICU TEAM Progress Note  David [name] ZOX:[id number] DOB: 09-25-1976 DOA: 01/15/2015 PCP: [name], MD  Admit HPI / Brief Narrative: 39 yo WM PMHx Crohn's disease and also has history of previous peptic ulcer disease. He is awaiting small bowel transplantation at [facility]. He also has had gastrojejunostomy in 2004 gated by abdominal wound and resulting Bovine Mesh (per patient), chronic gastroparesis. He also has cirrhosis of the liver most likely secondary to [place]. He recently was admitted for aspiration pneumonia. He now gives a 2 day history of dyspnea with coughing today. He is also had fevers. His oxygen saturation is very low and is now requiring a nonrebreather mask to maintain saturations above 90%. Chest x-ray suggestive of pneumonia, probably aspiration. He is now going to be admitted for further management.  HPI/Subjective: 2/11 A/O 4, patient admits still having some confusion as to timeline of events, NAD,  Assessment/Plan: Severe sepsis/Persistent fungemia/Suspected HCAP -Continue below antibiotics -Repeat blood cultures on 2/13  -Consult cardiology in a.m. TEE?  Hypoxic respiratory failure -See severe sepsis  Acute renal failure (Cr baseline 0.70 in 11/15) - Improving, continue normal saline at 70ml/hr  Crohn's disease of small and large bowel.  -Per patient awaiting small bowel and liver transplant at [facility]  Chronic gastroparesis -Patient has a PEG tube a minimum of 2 months old will consult GI in the a.m. secondary to his significant infection should be removed and management with NG tube for couple days?. -PEG tube fluid for culture and stain  Protein calorie malnutrition, severe. -TPN stopped secondary to sepsis -Patient on clear liquid diet, unsure we should proceed will consult GI in a.m.    Code Status: FULL Family Communication: no family present at time of exam Disposition Plan: Resolution sepsis; transferred to [facility] for  transplant?    Consultants:  Dr.Cornelius N Daiva Eves (infectious disease) Dr.Ryan B Sanford (nephrology) Dr.Daniel Daneil Dan [facility])   Procedure/Significant Events: 2/03 - intubated, transferred to [facility] ICU. Started on ARDS protocol 2/03 Renal consult: CRRT initiated for mgmt of severe acidosis and hyperkalemia 2/03 Gen surg consult: CT abdomen ordered. Noted that pt was difficult surgery @ [facility] 2/04 CTAP: Diffuse abnormal appearance of the bowel with multifocal bowel wall thick in. There is probable pneumatosis involving small bowel in the lower abdomen. No definite perforation. There is increased mesenteric edema and free fluid from prior exam. Decreased hydronephrosis. Splenomegaly. Worsening airspace disease in the lower lungs, left greater than Right 2/04 ID consult for fungemia: Micafungin added 2/07 Ventilator dyssynchrony with appearance of proximal airway obstruction. Improved with suctioning. Off vasopressors. Agitation difficult to control.  2/08 Passed SBT. Remains agitated. Extubated and tolerating. Weaning vasopressors to off. Persistent fungemia on F/U BCs. ID service recommends echocardiogram and change of indwelling catheters/lines. Uo picking up with furosemide. CRRT continued.  2/09 TTE: The estimated ejection fraction was 65%. Wall motion was normal; there were no regional wall motion abnormalities. Slight dilitation of aortic root. No obvious vegetations seen 2/09 Transfer to SDU ordered   Culture MRSA PCR 2/02 >> NEG Urine 2/04 >> NEG Resp 2/03 >> NEG Blood 2/02 >> 2/2 yeast Blood 2/03 >> 2/2 candida glabrata Blood 2/04 >> 1/2 yeast Resp 2/04 >> NOF Blood 2/08 >>   Antibiotics: Clinda 2/02 >> 2/02 Cefepime 2/02 >> 2/02 Vanc 2/02 >> 2/09 Pip-tazo 2/02 >> 2/09 Micafungin 2/03 >>   DVT prophylaxis:    Devices PEG tube placed~December 2015   LINES / TUBES:  ETT  2/03 >> stopped 2/08 R femoral A-line 2/03 >> stopped 2/08 R IJ HD cath 2/04 >> stopped  2/08 L IJ CVL 2/04 >> stopped 2/09    Continuous Infusions: . sodium chloride 75 mL/hr at 01/24/15 1036    Objective: VITAL SIGNS: Temp: 98.9 F (37.2 C) (02/11 1925) Temp Source: Oral (02/11 1925) BP: 108/68 mmHg (02/11 1922) Pulse Rate: 78 (02/11 1922) SPO2; FIO2:   Intake/Output Summary (Last 24 hours) at 01/24/15 2122 Last data filed at 01/24/15 1933  Gross per 24 hour  Intake   1225 ml  Output   5475 ml  Net  -4250 ml     Exam: General: No acute respiratory distress Lungs: Clear to auscultation bilaterally without wheezes or crackles Cardiovascular: Regular rate and rhythm without murmur gallop or rub normal S1 and S2 Abdomen: Nontender, nondistended, soft, bowel sounds positive, no rebound, no ascites, no appreciable mass Extremities: No significant cyanosis, clubbing, or edema bilateral lower extremities  Data Reviewed: Basic Metabolic Panel:  Recent Labs Lab 01/20/15 0400  01/20/15 1600 01/21/15 0349 01/21/15 1640 01/22/15 0420 01/23/15 0237 01/24/15 0318  NA  --   < > 133* 135 134* 136 140 138  K  --   < > 4.0 3.3* 3.9 3.0* 3.4* 3.5  CL  --   < > 101 101 101 101 103 102  CO2  --   < > GLUCOSE  --   < > 216* 178* 183* 284* 145* 146*  BUN  --   < > 50* 46* 40* 63* 87* 84*  CREATININE  --   < > 1.11 1.06 1.00 1.31 1.32 1.36*  CALCIUM  --   < > 6.9* 7.3* 7.4* 7.7* 7.4* 7.2*  MG 2.3  --   --  2.3  --  2.0 2.1 2.4  PHOS 2.9  < > 3.3 3.3 2.9 4.8* 6.5*  --   < > = values in this interval not displayed. Liver Function Tests:  Recent Labs Lab 01/19/15 0408  01/20/15 1600 01/21/15 0349 01/21/15 1640 01/22/15 0420 01/23/15 0237  AST 122*  --   --  60*  --   --   --   ALT 98*  --   --  75*  --   --   --   ALKPHOS 130*  --   --  178*  --   --   --   BILITOT 4.8*  --   --  5.8*  --   --   --   PROT 4.4*  --   --  5.3*  --   --   --   ALBUMIN 2.1*  < > 2.0* 2.3* 2.3* 2.1* 2.4*  < > = values in this interval not displayed. No  results for input(s): LIPASE, AMYLASE in the last 168 hours. No results for input(s): AMMONIA in the last 168 hours. CBC:  Recent Labs Lab 01/20/15 0400 01/21/15 0349 01/22/15 0420 01/23/15 0237 01/24/15 0318  WBC 6.0 8.9 6.4 8.2 9.7  NEUTROABS  --  7.0  --   --   --   HGB 7.6* 8.4* 7.3* 8.5* 9.2*  HCT 23.6* 26.3* 22.4* 25.6* 27.6*  MCV 78.1 79.9 78.0 75.7* 76.0*  PLT 23* 26* 33* 53* 99*   Cardiac Enzymes:  Recent Labs Lab 01/19/15 0015 01/19/15 0408  TROPONINI 0.03 <0.03   BNP (last 3 results) No results for input(s): BNP in the last 8760 hours.  ProBNP (last  3 results) No results for input(s): PROBNP in the last 8760 hours.  CBG:  Recent Labs Lab 01/23/15 2339 01/24/15 0328 01/24/15 1209 01/24/15 1536 01/24/15 1922  GLUCAP 114* 157* 99 167* 145*    Recent Results (from the past 240 hour(s))  Blood culture (routine x 2)     Status: None   Collection Time: 01/15/15  3:07 PM  Result Value Ref Range Status   Specimen Description BLOOD LEFT ANTECUBITAL  Final   Special Requests BOTTLES DRAWN AEROBIC AND ANAEROBIC [facility]  Final   Culture   Final    CANDIDA GLABRATA CANDIDA PARAPSILOSIS Note: Gram Stain Report Called to,Read Back By and Verified With: Armida Sans 1803 01/16/15 BY Ginette Pitman Performed at [facility] Performed at [facility]    Report Status 01/22/2015 FINAL  Final  Blood culture (routine x 2)     Status: None   Collection Time: 01/15/15  3:12 PM  Result Value Ref Range Status   Specimen Description BLOOD LEFT ARM  Final   Special Requests BOTTLES DRAWN AEROBIC AND ANAEROBIC 8CC  Final   Culture   Final    CANDIDA GLABRATA CANDIDA PARAPSILOSIS Note: Gram Stain Report Called to,Read Back By and Verified With: Armida Sans 1803 01/16/15 BY Ginette Pitman Performed at [facility] Performed at [facility]    Report Status 01/22/2015 FINAL  Final  MRSA PCR Screening     Status: None   Collection Time: 01/15/15  6:33  PM  Result Value Ref Range Status   MRSA by PCR NEGATIVE NEGATIVE Final    Comment:        The GeneXpert MRSA Assay (FDA approved for NASAL specimens only), is one component of a comprehensive MRSA colonization surveillance program. It is not intended to diagnose MRSA infection nor to guide or monitor treatment for MRSA infections.   Culture, respiratory (tracheal aspirate)     Status: None   Collection Time: 01/16/15  5:46 PM  Result Value Ref Range Status   Specimen Description TRACHEAL ASPIRATE  Final   Special Requests Normal  Final   Gram Stain   Final    FEW WBC PRESENT, PREDOMINANTLY PMN NO SQUAMOUS EPITHELIAL CELLS SEEN NO ORGANISMS SEEN Performed at [facility]    Culture   Final    NO GROWTH 2 DAYS Performed at [facility]    Report Status 01/19/2015 FINAL  Final  Culture, blood (routine x 2)     Status: None   Collection Time: 01/16/15  6:30 PM  Result Value Ref Range Status   Specimen Description BLOOD LEFT ARM  Final   Special Requests BOTTLES DRAWN AEROBIC AND ANAEROBIC 5CC  Final   Culture   Final    CANDIDA PARAPSILOSIS Note: Gram Stain Report Called to,Read Back By and Verified With: PHYLLIS MCKINNEY ON 2.5.2016 AT 7:30P BY WILEJ Performed at [facility]    Report Status 01/21/2015 FINAL  Final  Culture, blood (routine x 2)     Status: None   Collection Time: 01/16/15  6:45 PM  Result Value Ref Range Status   Specimen Description BLOOD LEFT HAND  Final   Special Requests BOTTLES DRAWN AEROBIC AND ANAEROBIC 5CC  Final   Culture   Final    CANDIDA GLABRATA Note: Gram Stain Report Called to,Read Back By and Verified With: CHRISTINE MATHERLY 01/19/15 0455A [facility] Performed at [facility]    Report Status 01/21/2015 FINAL  Final  Culture, respiratory (NON-Expectorated)  Status: None   Collection Time: 01/17/15  8:40 AM  Result Value Ref Range Status   Specimen Description TRACHEAL SITE  Final   Special Requests  NONE  Final   Gram Stain   Final    FEW WBC PRESENT,BOTH PMN AND MONONUCLEAR RARE SQUAMOUS EPITHELIAL CELLS PRESENT NO ORGANISMS SEEN Performed at [facility]    Culture   Final    Non-Pathogenic Oropharyngeal-type Flora Isolated. Performed at [facility]    Report Status 01/20/2015 FINAL  Final  Culture, blood (routine x 2)     Status: None   Collection Time: 01/17/15  1:13 PM  Result Value Ref Range Status   Specimen Description BLOOD LEFT HAND  Final   Special Requests BOTTLES DRAWN AEROBIC ONLY 7.5CC  Final   Culture   Final    NO GROWTH 5 DAYS Performed at [facility]    Report Status 01/23/2015 FINAL  Final  Culture, blood (routine x 2)     Status: None   Collection Time: 01/17/15  6:50 PM  Result Value Ref Range Status   Specimen Description BLOOD LEFT HAND  Final   Special Requests   Final    BOTTLES DRAWN AEROBIC AND ANAEROBIC 5CC AER,2CC ANA   Culture   Final    CANDIDA PARAPSILOSIS Note: Gram Stain Report Called to,Read Back By and Verified With: P PATEL RN 01/20/15 AT 1020 AM BY [facility] Performed at [facility]    Report Status 01/23/2015 FINAL  Final  Urine culture     Status: None   Collection Time: 01/17/15  8:42 PM  Result Value Ref Range Status   Specimen Description URINE, CATHETERIZED  Final   Special Requests Normal  Final   Colony Count NO GROWTH Performed at [facility]   Final   Culture NO GROWTH Performed at [facility]   Final   Report Status 01/19/2015 FINAL  Final  Culture, blood (routine x 2)     Status: None (Preliminary result)   Collection Time: 01/21/15 12:57 PM  Result Value Ref Range Status   Specimen Description BLOOD RIGHT ANTECUBITAL  Final   Special Requests BOTTLES DRAWN AEROBIC ONLY 6CC  Final   Culture   Final           BLOOD CULTURE RECEIVED NO GROWTH TO DATE CULTURE WILL BE HELD FOR 5 DAYS BEFORE ISSUING A FINAL NEGATIVE REPORT Performed at [facility]     Report Status PENDING  Incomplete  Culture, blood (routine x 2)     Status: None (Preliminary result)   Collection Time: 01/21/15  2:07 PM  Result Value Ref Range Status   Specimen Description BLOOD RIGHT ARM  Final   Special Requests BOTTLES DRAWN AEROBIC AND ANAEROBIC 5CC  Final   Culture   Final           BLOOD CULTURE RECEIVED NO GROWTH TO DATE CULTURE WILL BE HELD FOR 5 DAYS BEFORE ISSUING A FINAL NEGATIVE REPORT Performed at [facility]    Report Status PENDING  Incomplete  Culture, blood (single)     Status: None (Preliminary result)   Collection Time: 01/22/15  9:44 PM  Result Value Ref Range Status   Specimen Description BLOOD RIGHT ARM  Final   Special Requests   Final    BOTTLES DRAWN AEROBIC AND ANAEROBIC 10CC BLUE 8CC PURPLE   Culture   Final           BLOOD CULTURE  RECEIVED NO GROWTH TO DATE CULTURE WILL BE HELD FOR 5 DAYS BEFORE ISSUING A FINAL NEGATIVE REPORT Note: Culture results may be compromised due to an excessive volume of blood received in culture bottles. Performed at [facility]    Report Status PENDING  Incomplete  Culture, blood (routine x 2)     Status: None (Preliminary result)   Collection Time: 01/23/15  9:50 AM  Result Value Ref Range Status   Specimen Description BLOOD LEFT ARM  Final   Special Requests BOTTLES DRAWN AEROBIC ONLY 6CC  Final   Culture   Final           BLOOD CULTURE RECEIVED NO GROWTH TO DATE CULTURE WILL BE HELD FOR 5 DAYS BEFORE ISSUING A FINAL NEGATIVE REPORT Performed at [facility]    Report Status PENDING  Incomplete  Culture, blood (routine x 2)     Status: None (Preliminary result)   Collection Time: 01/23/15 10:00 AM  Result Value Ref Range Status   Specimen Description BLOOD LEFT HAND  Final   Special Requests BOTTLES DRAWN AEROBIC ONLY 5CC  Final   Culture   Final           BLOOD CULTURE RECEIVED NO GROWTH TO DATE CULTURE WILL BE HELD FOR 5 DAYS BEFORE ISSUING A FINAL NEGATIVE  REPORT Performed at [facility]    Report Status PENDING  Incomplete     Studies:  Recent x-ray studies have been reviewed in detail by the Attending Physician  Scheduled Meds:  Scheduled Meds: . chlorhexidine  15 mL Mouth Rinse BID  . hydrocortisone sod succinate (SOLU-CORTEF) inj  25 mg Intravenous Q12H  . insulin aspart  0-20 Units Subcutaneous 6 times per day  . micafungin (MYCAMINE) IV  100 mg Intravenous QHS  . pantoprazole (PROTONIX) IV  40 mg Intravenous Q12H    Time spent on care of this patient: 40 mins   Drema [facility]  [facility] Office  [phone] Pager - [phone]  On-Call/Text Page:      [email or web address]      password [email or web address]  If 7PM-7AM, please contact night-coverage [email or web address] Password [email or web address] 01/24/2015, 9:22 PM   LOS: 9 days   Care during the described time interval was provided by me .  I have reviewed this patient's available data, including medical history, events of note, physical examination, radiology studies and test results as part of my evaluation  Carolyne Littles, MD [phone] Pager

## 2015-01-24 NOTE — Consult Note (Signed)
Reason for consult:  Rule out fungal endophthalmitis  HPI: David [name] is an 39 y.o. male who has Chron's disease requiring chronic TPN.  He has secondary Candida infection of the blood.  No visual changes.  No floaters or flashes.  Past ocular history:  LASIK  Family ocular history: negative  Past Medical History  Diagnosis Date  . 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 Hepatomegaly 05/04/2011  . Fatty liver 05/04/2011    "stage III fatty liver fibrosis" (11/15/2012)  . GERD (gastroesophageal reflux disease)   . Chronic liver disease     /notes 11/15/2012  . Pericarditis     [name] 11/15/2012  . Hypertension   . Pneumonia 1977  . Shortness of breath     "all the time right now" (11/15/2012)  . History of blood transfusion 2004  . History of stomach ulcers   . Duodenal ulcer   . Depression   . Kidney stones     bilaterally/notes 11/15/2012  . Hepatic fibrosis     [name] 11/15/2012  . Anemia   . Pericardial effusion 10/29/2012    moderate to large/notes 11/15/2012  . Anxiety   . Hepatitis   . Crohn's disease   . ED (erectile dysfunction)   . 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 Anal examination under anesthesia  02/11/2011    [name]  . Treatment fistula anal  07/03/11    This was a second surgery to repair Anal fistula  . Gastrojejunostomy  2004    "had hole cut in small intestines during endoscopy; had to have OR & leave me open 3 months" (11/15/2012)  . Cholecystectomy  ~ 2003  . Appendectomy  ~ 2003  . Video assisted thoracoscopy  11/17/2012    Procedure: VIDEO ASSISTED THORACOSCOPY;  Surgeon: Loreli Slot, MD;  Location: [facility] OR;  Service: Thoracic;  Laterality: Left;  drainage of left pleural effusion  . Pericardial window  11/17/2012    Procedure: PERICARDIAL WINDOW;  Surgeon: Loreli Slot,  MD;  Location: [facility] OR;  Service: Thoracic;  Laterality: N/A;  . Esophagogastroduodenoscopy  01/06/2013    Procedure: ESOPHAGOGASTRODUODENOSCOPY (EGD);  Surgeon: Malissa Hippo, MD;  Location: [facility];  Service: Endoscopy;  Laterality: N/A;  11:15  . Esophagogastroduodenoscopy N/A 03/09/2014    Dr. Karilyn Cota: erosive/ulcerative reflux esophagiits, portal gastropathy, moderate amount of bile and food debris in stomach precluding visualization of GJ anastomosis  . Gastrostomy tube placement  11/19/14    [facility]: 55 F APDL catheter. Checked again 12/23 via fluoroscopy and in suitable position  . Colonoscopy  Aug 2015    Dr. Edyth Gunnels at [facility]: congested mucosa in entire colon, solitary ulcer distal ileum, stricture in ileum 5 cm from IC valve s/p dilation. Surveillance in 1 year  . Exploratory laparotomy  Nov 05, 2014    [facility], Dr. Byrd Hesselbach. Several hour operation with unsuccessful lysis of adhesions due to frozen abdomen  . Picc line     Family History  Problem Relation Age of Onset  . Diabetes Mother   . Diabetes Brother   . Healthy Daughter   . Healthy Son   . Colon cancer Neg Hx    Current Facility-Administered Medications  Medication Dose Route Frequency Provider Last Rate Last Dose  . 0.9 %  sodium chloride infusion  250  mL Intravenous PRN Collene Gobble, MD   Stopped at 01/22/15 1700  . 0.9 %  sodium chloride infusion   Intravenous Continuous Rexene Agent, MD 75 mL/hr at 01/24/15 1036    . albuterol (PROVENTIL) (2.5 MG/3ML) 0.083% nebulizer solution 2.5 mg  2.5 mg Nebulization Q3H PRN Wilhelmina Mcardle, MD   2.5 mg at 01/20/15 1104  . chlorhexidine (PERIDEX) 0.12 % solution 15 mL  15 mL Mouth Rinse BID Rande Lawman Rumbarger, RPH   15 mL at 01/24/15 1036  . fentaNYL (SUBLIMAZE) injection 25-100 mcg  25-100 mcg Intravenous Q2H PRN Wilhelmina Mcardle, MD   100 mcg at 01/24/15 1933  . hydrocortisone sodium succinate (SOLU-CORTEF) 100 MG injection 25 mg  25 mg Intravenous Q12H Wilhelmina Mcardle, MD   25 mg at 01/24/15 1404  . insulin aspart (novoLOG) injection 0-20 Units  0-20 Units Subcutaneous 6 times per day Lauren Bajbus, RPH   4 Units at 01/24/15 1613  . micafungin (MYCAMINE) 100 mg in sodium chloride 0.9 % 100 mL IVPB  100 mg Intravenous QHS Truman Hayward, MD   100 mg at 01/23/15 2240  . pantoprazole (PROTONIX) injection 40 mg  40 mg Intravenous Q12H Rexene Alberts, MD   40 mg at 01/24/15 1036   Allergies  Allergen Reactions  . Remicade [Infliximab]     Dr Laural Golden states previous respiratory arrest not related to remicade. Dr Laural Golden states remicade not a drug related allergy. 07-07-2013 at 1025 rapid response called to PACU- Patient difficulty breathing after infusion of Remicade infusing. Do NOT Give Remicade!  . Fentanyl And Related Itching    Swelling, Redness Tolerated fentanyl 01/2015  . Alfentanil Itching    Swelling, Redness  . Wellbutrin [Bupropion] Nausea And Vomiting  . Morphine And Related Hives   History   Social History  . Marital Status: Married    Spouse Name: N/A  . Number of Children: 2  . Years of Education: N/A   Occupational History  . disabled/retired    Social History Main Topics  . Smoking status: Former Smoker -- 0.50 packs/day for 24 years    Types: Cigarettes    Start date: 10/28/2012    Quit date: 11/22/2012  . Smokeless tobacco: Current User    Types: Snuff     Comment: 1/2 pack a month  . Alcohol Use: No  . Drug Use: No  . Sexual Activity: Not Currently   Other Topics Concern  . Not on file   Social History Narrative   Crohns h/o      This is a 39 y.o. year old male with Crohn's disease. He had symptoms of abdominal pain since he was 14 but was diagnosed in 2002-2004. He had an EGD at some point that showed esophageal, gastric and antral ulcers. It is not clear if this was related to Crohn's or peptic. He was treated with acid suppressive therapy without much luck so he had another EGD which was complicated by  duodenal perforation that required surgical repair/gastrojejunostomy. His post-op course was complicated by wound infection. At some point he was diagnosed with ileocolonic Crohn's disease. Historically he was treated with oral 5 ASA, prednisone, Imuran, MTX and anti-TNF therapy. He started with Remicade and was on it for 2 months without improvement so it was stopped and patient was switched to 6 MP. While on the 6 MP he had to be on steroids most of the time so 6 MP was discontinued. He had no  side effects with the 6 MP. Around that time he was treated with MTX weekly injection, he said on and off for few years. He tells me that after that he tried Humira 6-12 months and Cimzia 6-12 month without prolonged response. It always seemed that he has initial response to the therapy that quickly wears off. He was re-challenged with Remicade around 2008 and stayed on it since then. His dose and dosing interval had to be changed multiple times due to clinical symptoms. 2-3 months ago he had an anaphylactic reactions to Remicade x 2 despite that he was pre-medicated with 200 mg of hydrocortisone. His Remicade was stopped. No Remicade level or antibody was checked. It is not clear why he was not on IMM while on Remicade.       About 1-2 years he was diagnosed with cirrhosis, he tells me it's due to MTX although he was on weekly injections and only for few years. There is mention of possible NASH as an etiology seen on liver bx in the reviewed notes. He is followed at [facility] for his cirrhosis, his complications are mainly ascites and edema. He also has CRI. Multiple imaging studies including MRE and CTE showing active disease in TI. TPMT normal October 2014. EGD in 1/14 showed esophagitis, gastritis, PHG and gastrojejunostomy. 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 rate 16, height $RemoveBe'5\' 11"'GGytfeEfL$  (1.803 m), weight 70.3 kg (154 lb 15.7 oz), SpO2 99 %.   VA  near:  OD 20/25 OS  20/20  Pupils:   OD round, reactive to light, no APD            OS round, reactive to light, no APD  IOP (T pen)  OD  22 OS  223 (squeezing)  CVF: OD full to CF   OS full to CF  Motility:  OD full ductions  OS full ductions  Balance/alignment:  Ortho by Luiz Ochoa   Slit lamp examination:                                 OD                                       External/adnexa: Normal                                      Lids/lashes:        A few petechiae                                     Conjunctiva  Temporal subconjunctival hemorrhage       Cornea:              Clear                  AC:                     Deep, quiet                                Iris:                     Normal        Lens:                  Clear                                       OS                                       External/adnexa: Normal                                      Lids/lashes:        Petechiae                                       Conjunctiva        White, quiet        Cornea:              Clear                  AC:                     Deep, quiet                                Iris:                     Normal        Lens:                  Clear       Dilated fundus exam: (Neo 2.5; Myd 1%)      OD Vitreous            Clear, quiet                                Optic Disc:       Normal, perfused                      Macula:             Flat  Vessels:           Normal caliber,distribution         Periphery:         Flat, attached, + a few scattered dot-blot hemorrhages                                      OS Vitreous            Clear, quiet                                Optic Disc:       Normal, perfused                      Macula:             Flat                                            Vessels:           Normal caliber,distribution         Periphery:         Flat, attached, + a few scattered dot-blot hemorrhages        Labs/studies: Results for orders placed or performed during the hospital encounter of 01/15/15 (from the past 48 hour(s))  Glucose, capillary     Status: Abnormal   Collection Time: 01/22/15  8:57 PM  Result Value Ref Range   Glucose-Capillary 140 (H) 70 - 99 mg/dL  Culture, blood (single)     Status: None (Preliminary result)   Collection Time: 01/22/15  9:44 PM  Result Value Ref Range   Specimen Description BLOOD RIGHT ARM    Special Requests      BOTTLES DRAWN AEROBIC AND ANAEROBIC 10CC BLUE 8CC PURPLE   Culture             BLOOD CULTURE RECEIVED NO GROWTH TO DATE CULTURE WILL BE HELD FOR 5 DAYS BEFORE ISSUING A FINAL NEGATIVE REPORT Note: Culture results may be compromised due to an excessive volume of blood received in culture bottles. Performed at [facility]    Report Status PENDING   Glucose, capillary     Status: None   Collection Time: 01/22/15 11:46 PM  Result Value Ref Range   Glucose-Capillary 95 70 - 99 mg/dL  Renal function panel (daily at 0500)     Status: Abnormal   Collection Time: 01/23/15  2:37 AM  Result Value Ref Range   Sodium 140 135 - 145 mmol/L   Potassium 3.4 (L) 3.5 - 5.1 mmol/L   Chloride 103 96 - 112 mmol/L   CO2 23 19 - 32 mmol/L   Glucose, Bld 145 (H) 70 - 99 mg/dL   BUN 87 (H) 6 - 23 mg/dL   Creatinine, Ser 1.32 0.50 - 1.35 mg/dL   Calcium 7.4 (L) 8.4 - 10.5 mg/dL   Phosphorus 6.5 (H) 2.3 - 4.6 mg/dL   Albumin 2.4  (L) 3.5 - 5.2 g/dL   GFR calc non Af Amer 67 (L) >90 mL/min   GFR calc Af Amer 78 (L) >90 mL/min    Comment: (NOTE) The eGFR has been calculated using the CKD EPI equation. This calculation  has not been validated in all clinical situations. eGFR's persistently <90 mL/min signify possible Chronic Kidney Disease.    Anion gap 14 5 - 15  CBC     Status: Abnormal   Collection Time: 01/23/15  2:37 AM  Result Value Ref Range   WBC 8.2 4.0 - 10.5 K/uL   RBC 3.38 (L) 4.22 - 5.81 MIL/uL   Hemoglobin 8.5 (L) 13.0 - 17.0 g/dL   HCT 25.6 (L) 39.0 - 52.0 %   MCV 75.7 (L) 78.0 - 100.0 fL   MCH 25.1 (L) 26.0 - 34.0 pg   MCHC 33.2 30.0 - 36.0 g/dL   RDW 20.5 (H) 11.5 - 15.5 %   Platelets 53 (L) 150 - 400 K/uL    Comment: REPEATED TO VERIFY PLATELET COUNT CONFIRMED BY SMEAR DELTA CHECK NOTED   Magnesium     Status: None   Collection Time: 01/23/15  2:37 AM  Result Value Ref Range   Magnesium 2.1 1.5 - 2.5 mg/dL  Glucose, capillary     Status: Abnormal   Collection Time: 01/23/15  4:09 AM  Result Value Ref Range   Glucose-Capillary 155 (H) 70 - 99 mg/dL  Glucose, capillary     Status: Abnormal   Collection Time: 01/23/15  7:33 AM  Result Value Ref Range   Glucose-Capillary 109 (H) 70 - 99 mg/dL   Comment 1 Notify RN    Comment 2 Documented in Char   Culture, blood (routine x 2)     Status: None (Preliminary result)   Collection Time: 01/23/15  9:50 AM  Result Value Ref Range   Specimen Description BLOOD LEFT ARM    Special Requests BOTTLES DRAWN AEROBIC ONLY 6CC    Culture             BLOOD CULTURE RECEIVED NO GROWTH TO DATE CULTURE WILL BE HELD FOR 5 DAYS BEFORE ISSUING A FINAL NEGATIVE REPORT Performed at [facility]    Report Status PENDING   Culture, blood (routine x 2)     Status: None (Preliminary result)   Collection Time: 01/23/15 10:00 AM  Result Value Ref Range   Specimen Description BLOOD LEFT HAND    Special Requests BOTTLES DRAWN AEROBIC ONLY 5CC    Culture              BLOOD CULTURE RECEIVED NO GROWTH TO DATE CULTURE WILL BE HELD FOR 5 DAYS BEFORE ISSUING A FINAL NEGATIVE REPORT Performed at [facility]    Report Status PENDING   Glucose, capillary     Status: Abnormal   Collection Time: 01/23/15 12:28 PM  Result Value Ref Range   Glucose-Capillary 108 (H) 70 - 99 mg/dL  Glucose, capillary     Status: Abnormal   Collection Time: 01/23/15  3:18 PM  Result Value Ref Range   Glucose-Capillary 123 (H) 70 - 99 mg/dL  Glucose, capillary     Status: Abnormal   Collection Time: 01/23/15  7:36 PM  Result Value Ref Range   Glucose-Capillary 146 (H) 70 - 99 mg/dL  Glucose, capillary     Status: Abnormal   Collection Time: 01/23/15 11:39 PM  Result Value Ref Range   Glucose-Capillary 114 (H) 70 - 99 mg/dL  Magnesium     Status: None   Collection Time: 01/24/15  3:18 AM  Result Value Ref Range   Magnesium 2.4 1.5 - 2.5 mg/dL  CBC     Status: Abnormal   Collection Time: 01/24/15  3:18 AM  Result Value Ref Range   WBC 9.7 4.0 - 10.5 K/uL   RBC 3.63 (L) 4.22 - 5.81 MIL/uL   Hemoglobin 9.2 (L) 13.0 - 17.0 g/dL   HCT 27.6 (L) 39.0 - 52.0 %   MCV 76.0 (L) 78.0 - 100.0 fL   MCH 25.3 (L) 26.0 - 34.0 pg   MCHC 33.3 30.0 - 36.0 g/dL   RDW 20.8 (H) 11.5 - 15.5 %   Platelets 99 (L) 150 - 400 K/uL    Comment: PLATELET COUNT CONFIRMED BY SMEAR REPEATED TO VERIFY SPECIMEN CHECKED FOR CLOTS DELTA CHECK NOTED   Basic metabolic panel     Status: Abnormal   Collection Time: 01/24/15  3:18 AM  Result Value Ref Range   Sodium 138 135 - 145 mmol/L   Potassium 3.5 3.5 - 5.1 mmol/L   Chloride 102 96 - 112 mmol/L   CO2 27 19 - 32 mmol/L   Glucose, Bld 146 (H) 70 - 99 mg/dL   BUN 84 (H) 6 - 23 mg/dL   Creatinine, Ser 1.36 (H) 0.50 - 1.35 mg/dL   Calcium 7.2 (L) 8.4 - 10.5 mg/dL   GFR calc non Af Amer 65 (L) >90 mL/min   GFR calc Af Amer 75 (L) >90 mL/min    Comment: (NOTE) The eGFR has been calculated using the CKD EPI equation. This  calculation has not been validated in all clinical situations. eGFR's persistently <90 mL/min signify possible Chronic Kidney Disease.    Anion gap 9 5 - 15  Glucose, capillary     Status: Abnormal   Collection Time: 01/24/15  3:28 AM  Result Value Ref Range   Glucose-Capillary 157 (H) 70 - 99 mg/dL  Glucose, capillary     Status: None   Collection Time: 01/24/15 12:09 PM  Result Value Ref Range   Glucose-Capillary 99 70 - 99 mg/dL   Comment 1 Notify RN    Comment 2 Documented in Char   Glucose, capillary     Status: Abnormal   Collection Time: 01/24/15  3:36 PM  Result Value Ref Range   Glucose-Capillary 167 (H) 70 - 99 mg/dL   Comment 1 Notify RN    Comment 2 Documented in Char   Glucose, capillary     Status: Abnormal   Collection Time: 01/24/15  7:22 PM  Result Value Ref Range   Glucose-Capillary 145 (H) 70 - 99 mg/dL   No results found.                           Assessment and Plan: 1.  Candida septicemia without ocular involvement.   Plan:  Call if vision changes or eye pain 2.  Mild retinal hemorrhages OU, subconjunctival hem OD, lid petechiae OU --- likely secondary to chronic liver disease and anemia.  Plan  Monitor.  I gave patient my card.  He will call to set up an appointment with me in 6 months.   All of the above information was relayed to the patient and/or patient family.  Ophthalmic warning signs and symptoms were reviewed, and clear instructions for immediate phone contact and/or immediate return to the ED or clinic were provided should any of these signs or symptoms occur.  Follow up contact information was provided.  All questions were answered.   Harvey L 01/24/2015, 7:49 PM  [facility] [phone]

## 2015-01-24 NOTE — Progress Notes (Signed)
[facility]       Subjective:  Feels better today   Antibiotics:  Anti-infectives    Start     Dose/Rate Route Frequency Ordered Stop   01/22/15 0200  piperacillin-tazobactam (ZOSYN) IVPB 3.375 g  Status:  Discontinued     3.375 g 12.5 mL/hr over 240 Minutes Intravenous 3 times per day 01/22/15 0013 01/22/15 1036   01/20/15 1800  vancomycin (VANCOCIN) IVPB 750 mg/150 ml premix  Status:  Discontinued     750 mg 150 mL/hr over 60 Minutes Intravenous Every 12 hours 01/20/15 1753 01/22/15 1036   01/17/15 1800  vancomycin (VANCOCIN) IVPB 1000 mg/200 mL premix  Status:  Discontinued     1,000 mg 200 mL/hr over 60 Minutes Intravenous Every 24 hours 01/17/15 0140 01/20/15 1753   01/17/15 0600  piperacillin-tazobactam (ZOSYN) IVPB 3.375 g  Status:  Discontinued     3.375 g 100 mL/hr over 30 Minutes Intravenous 4 times per day 01/17/15 0140 01/22/15 0013   01/16/15 2300  micafungin (MYCAMINE) 100 mg in sodium chloride 0.9 % 100 mL IVPB     100 mg 100 mL/hr over 1 Hours Intravenous Daily at bedtime 01/16/15 2140     01/16/15 2200  piperacillin-tazobactam (ZOSYN) IVPB 3.375 g  Status:  Discontinued     3.375 g 12.5 mL/hr over 240 Minutes Intravenous 3 times per day 01/16/15 1733 01/17/15 0133   01/16/15 1800  vancomycin (VANCOCIN) IVPB 1000 mg/200 mL premix  Status:  Discontinued     1,000 mg 200 mL/hr over 60 Minutes Intravenous Every 8 hours 01/16/15 1733 01/17/15 0134   01/16/15 0400  vancomycin (VANCOCIN) IVPB 1000 mg/200 mL premix  Status:  Discontinued     1,000 mg 200 mL/hr over 60 Minutes Intravenous Every 12 hours 01/15/15 1836 01/16/15 1710   01/15/15 2200  piperacillin-tazobactam (ZOSYN) IVPB 3.375 g  Status:  Discontinued     3.375 g 12.5 mL/hr over 240 Minutes Intravenous 3 times per day 01/15/15 1853 01/16/15 1710   01/15/15 1600  vancomycin (VANCOCIN) IVPB 1000 mg/200 mL premix     1,000 mg 200 mL/hr over 60 Minutes Intravenous  Once  01/15/15 1557 01/15/15 1754   01/15/15 1500  ceFEPIme (MAXIPIME) 2 g in dextrose 5 % 50 mL IVPB     2 g 100 mL/hr over 30 Minutes Intravenous  Once 01/15/15 1458 01/15/15 1540   01/15/15 1500  clindamycin (CLEOCIN) IVPB 600 mg     600 mg 100 mL/hr over 30 Minutes Intravenous  Once 01/15/15 1458 01/15/15 1537      Medications: Scheduled Meds: . chlorhexidine  15 mL Mouth Rinse BID  . hydrocortisone sod succinate (SOLU-CORTEF) inj  25 mg Intravenous Q12H  . insulin aspart  0-20 Units Subcutaneous 6 times per day  . micafungin (MYCAMINE) IV  100 mg Intravenous QHS  . pantoprazole (PROTONIX) IV  40 mg Intravenous Q12H   Continuous Infusions: . sodium chloride 75 mL/hr at 01/24/15 1036   PRN Meds:.sodium chloride, albuterol, fentaNYL    Objective: Weight change: -10 lb 5.8 oz (-4.7 kg)  Intake/Output Summary (Last 24 hours) at 01/24/15 1425 Last data filed at 01/24/15 1100  Gross per 24 hour  Intake    730 ml  Output   4950 ml  Net  -4220 ml   Blood pressure 110/67, pulse 86, temperature 98.8 F (37.1 C), temperature source Oral, resp. rate 22, height  (1.803 m), weight 154 lb 15.7 oz (70.3 kg),  SpO2 99 %. Temp:  [98.1 F (36.7 C)-99.1 F (37.3 C)] 98.8 F (37.1 C) (02/11 1100) Pulse Rate:  [78-97] 86 (02/11 1319) Resp:  [16-22] 22 (02/11 1319) BP: (105-115)/(67-74) 110/67 mmHg (02/11 1050) SpO2:  [95 %-100 %] 99 % (02/11 1319) Weight:  [154 lb 15.7 oz (70.3 kg)] 154 lb 15.7 oz (70.3 kg) (02/11 0327)  Physical Exam: General: Alert oriented conversant oriented x 3 HEENT: Normocephalic,  CVS regular rate, normal r, no murmur rubs or gallops Chest:rhonchi Abdomen: soft, bandage in place.  Extremities: 2+ edema Neuro: nonfocal   CBC:   CBC Latest Ref Rng 01/24/2015 01/23/2015 01/22/2015  WBC 4.0 - 10.5 K/uL 9.7 8.2 6.4  Hemoglobin 13.0 - 17.0 g/dL 1.6(X) 0.9(U) 7.3(L)  Hematocrit 39.0 - 52.0 % 27.6(L) 25.6(L) 22.4(L)  Platelets 150 - 400 K/uL 99(L) 53(L)  33(L)       BMET  Recent Labs  01/23/15 0237 01/24/15 0318  NA 140 138  K 3.4* 3.5  CL 103 102  CO2 23 27  GLUCOSE 145* 146*  BUN 87* 84*  CREATININE 1.32 1.36*  CALCIUM 7.4* 7.2*     Liver Panel   Recent Labs  01/22/15 0420 01/23/15 0237  ALBUMIN 2.1* 2.4*       Sedimentation Rate No results for input(s): ESRSEDRATE in the last 72 hours. C-Reactive Protein No results for input(s): CRP in the last 72 hours.  Micro Results: Recent Results (from the past 720 hour(s))  Clostridium Difficile by PCR     Status: None   Collection Time: 12/26/14 10:16 PM  Result Value Ref Range Status   C difficile by pcr NEGATIVE NEGATIVE Final  Wound culture     Status: None   Collection Time: 12/31/14 12:54 AM  Result Value Ref Range Status   Specimen Description WOUND ABDOMEN  Final   Special Requests IMMUNE:COMPROMISED  Final   Gram Stain   Final    RARE WBC PRESENT, PREDOMINANTLY MONONUCLEAR NO SQUAMOUS EPITHELIAL CELLS SEEN RARE GRAM POSITIVE COCCI IN PAIRS IN CLUSTERS FEW YEAST Performed at [facility]    Culture   Final    MULTIPLE ORGANISMS PRESENT, NONE PREDOMINANT Note: NO GROUP A STREP (S.PYOGENES) ISOLATED NO STAPHYLOCOCCUS AUREUS ISOLATED Performed at [facility]    Report Status 01/03/2015 FINAL  Final  Blood culture (routine x 2)     Status: None   Collection Time: 01/15/15  3:07 PM  Result Value Ref Range Status   Specimen Description BLOOD LEFT ANTECUBITAL  Final   Special Requests BOTTLES DRAWN AEROBIC AND ANAEROBIC [facility]  Final   Culture   Final    CANDIDA GLABRATA CANDIDA PARAPSILOSIS Note: Gram Stain Report Called to,Read Back By and Verified With: [facility] [place]oder 1803 01/16/15 BY Ginette Pitman Performed at [facility] Performed at [facility]    Report Status 01/22/2015 FINAL  Final  Blood culture (routine x 2)     Status: None   Collection Time: 01/15/15  3:12 PM  Result Value Ref Range Status   Specimen  Description BLOOD LEFT ARM  Final   Special Requests BOTTLES DRAWN AEROBIC AND ANAEROBIC 8CC  Final   Culture   Final    CANDIDA GLABRATA CANDIDA PARAPSILOSIS Note: Gram Stain Report Called to,Read Back By and Verified With: [facility] Gintz 1803 01/16/15 BY Ginette Pitman Performed at [facility] Performed at [facility]    Report Status 01/22/2015 FINAL  Final  MRSA PCR Screening  Status: None   Collection Time: 01/15/15  6:33 PM  Result Value Ref Range Status   MRSA by PCR NEGATIVE NEGATIVE Final    Comment:        The GeneXpert MRSA Assay (FDA approved for NASAL specimens only), is one component of a comprehensive MRSA colonization surveillance program. It is not intended to diagnose MRSA infection nor to guide or monitor treatment for MRSA infections.   Culture, respiratory (tracheal aspirate)     Status: None   Collection Time: 01/16/15  5:46 PM  Result Value Ref Range Status   Specimen Description TRACHEAL ASPIRATE  Final   Special Requests Normal  Final   Gram Stain   Final    FEW WBC PRESENT, PREDOMINANTLY PMN NO SQUAMOUS EPITHELIAL CELLS SEEN NO ORGANISMS SEEN Performed at [facility]    Culture   Final    NO GROWTH 2 DAYS Performed at [facility]    Report Status 01/19/2015 FINAL  Final  Culture, blood (routine x 2)     Status: None   Collection Time: 01/16/15  6:30 PM  Result Value Ref Range Status   Specimen Description BLOOD LEFT ARM  Final   Special Requests BOTTLES DRAWN AEROBIC AND ANAEROBIC 5CC  Final   Culture   Final    CANDIDA PARAPSILOSIS Note: Gram Stain Report Called to,Read Back By and Verified With: PHYLLIS MCKINNEY ON 2.5.2016 AT 7:30P BY WILEJ Performed at [facility]    Report Status 01/21/2015 FINAL  Final  Culture, blood (routine x 2)     Status: None   Collection Time: 01/16/15  6:45 PM  Result Value Ref Range Status   Specimen Description BLOOD LEFT HAND  Final   Special Requests BOTTLES  DRAWN AEROBIC AND ANAEROBIC 5CC  Final   Culture   Final    CANDIDA GLABRATA Note: Gram Stain Report Called to,Read Back By and Verified With: CHRISTINE MATHERLY 01/19/15 0455A [facility] Performed at [facility]    Report Status 01/21/2015 FINAL  Final  Culture, respiratory (NON-Expectorated)     Status: None   Collection Time: 01/17/15  8:40 AM  Result Value Ref Range Status   Specimen Description TRACHEAL SITE  Final   Special Requests NONE  Final   Gram Stain   Final    FEW WBC PRESENT,BOTH PMN AND MONONUCLEAR RARE SQUAMOUS EPITHELIAL CELLS PRESENT NO ORGANISMS SEEN Performed at [facility]    Culture   Final    Non-Pathogenic Oropharyngeal-type Flora Isolated. Performed at [facility]    Report Status 01/20/2015 FINAL  Final  Culture, blood (routine x 2)     Status: None   Collection Time: 01/17/15  1:13 PM  Result Value Ref Range Status   Specimen Description BLOOD LEFT HAND  Final   Special Requests BOTTLES DRAWN AEROBIC ONLY 7.5CC  Final   Culture   Final    NO GROWTH 5 DAYS Performed at [facility]    Report Status 01/23/2015 FINAL  Final  Culture, blood (routine x 2)     Status: None   Collection Time: 01/17/15  6:50 PM  Result Value Ref Range Status   Specimen Description BLOOD LEFT HAND  Final   Special Requests   Final    BOTTLES DRAWN AEROBIC AND ANAEROBIC 5CC AER,2CC ANA   Culture   Final    CANDIDA PARAPSILOSIS Note: Gram Stain Report Called to,Read Back By and Verified With: P PATEL RN 01/20/15 AT 1020 AM  BY [facility] Performed at [facility]    Report Status 01/23/2015 FINAL  Final  Urine culture     Status: None   Collection Time: 01/17/15  8:42 PM  Result Value Ref Range Status   Specimen Description URINE, CATHETERIZED  Final   Special Requests Normal  Final   Colony Count NO GROWTH Performed at [facility]   Final   Culture NO GROWTH Performed at [facility]   Final   Report Status  01/19/2015 FINAL  Final  Culture, blood (routine x 2)     Status: None (Preliminary result)   Collection Time: 01/21/15 12:57 PM  Result Value Ref Range Status   Specimen Description BLOOD RIGHT ANTECUBITAL  Final   Special Requests BOTTLES DRAWN AEROBIC ONLY 6CC  Final   Culture   Final           BLOOD CULTURE RECEIVED NO GROWTH TO DATE CULTURE WILL BE HELD FOR 5 DAYS BEFORE ISSUING A FINAL NEGATIVE REPORT Performed at [facility]    Report Status PENDING  Incomplete  Culture, blood (routine x 2)     Status: None (Preliminary result)   Collection Time: 01/21/15  2:07 PM  Result Value Ref Range Status   Specimen Description BLOOD RIGHT ARM  Final   Special Requests BOTTLES DRAWN AEROBIC AND ANAEROBIC 5CC  Final   Culture   Final           BLOOD CULTURE RECEIVED NO GROWTH TO DATE CULTURE WILL BE HELD FOR 5 DAYS BEFORE ISSUING A FINAL NEGATIVE REPORT Performed at [facility]    Report Status PENDING  Incomplete  Culture, blood (single)     Status: None (Preliminary result)   Collection Time: 01/22/15  9:44 PM  Result Value Ref Range Status   Specimen Description BLOOD RIGHT ARM  Final   Special Requests   Final    BOTTLES DRAWN AEROBIC AND ANAEROBIC 10CC BLUE 8CC PURPLE   Culture   Final           BLOOD CULTURE RECEIVED NO GROWTH TO DATE CULTURE WILL BE HELD FOR 5 DAYS BEFORE ISSUING A FINAL NEGATIVE REPORT Note: Culture results may be compromised due to an excessive volume of blood received in culture bottles. Performed at [facility]    Report Status PENDING  Incomplete  Culture, blood (routine x 2)     Status: None (Preliminary result)   Collection Time: 01/23/15  9:50 AM  Result Value Ref Range Status   Specimen Description BLOOD LEFT ARM  Final   Special Requests BOTTLES DRAWN AEROBIC ONLY 6CC  Final   Culture   Final           BLOOD CULTURE RECEIVED NO GROWTH TO DATE CULTURE WILL BE HELD FOR 5 DAYS BEFORE ISSUING A FINAL NEGATIVE REPORT Performed  at [facility]    Report Status PENDING  Incomplete  Culture, blood (routine x 2)     Status: None (Preliminary result)   Collection Time: 01/23/15 10:00 AM  Result Value Ref Range Status   Specimen Description BLOOD LEFT HAND  Final   Special Requests BOTTLES DRAWN AEROBIC ONLY 5CC  Final   Culture   Final           BLOOD CULTURE RECEIVED NO GROWTH TO DATE CULTURE WILL BE HELD FOR 5 DAYS BEFORE ISSUING A FINAL NEGATIVE REPORT Performed at [facility]    Report Status PENDING  Incomplete  Studies/Results: No results found.    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o. male with multiple medical problems cirrhosis Crohn's disease on TPN via PICC at home admitted with apparent healthcare associated pneumonia sepsis +/- intrabdominal infection now found to be fungemic with C glabrata and C parapsilosis  #1 Persistently positive candidemia despite removal of his PICC line and initiation of micafungin. His hemodialysis catheter femoral arterial line and internal jugular vein line are all undoubtedly seeded with Candida and responsible for his persistent candidemia.  ALL LINES ARE OUT!  --REPEAT BLOOD CULTURES ONCE LINES ALL OUT  (today)  --would give  "HOLIDAY FROM TPN AND CENTRAL LINE" if possible thru 01/25/15 WITH NEGATIVE repeat blood cultures  -- I called Dr Ovidio Kin office to request formal Ophthalmologyconsult and  funduscopic exam   --he DEFINITELY NEEDS TO STAY on MICAFUNGIN given C glabrata  --2D echo was negative but I would also get a TEE during his stay  given persistently positive cultures  I spent greater than 40 minutes with the patient including greater than 50% of time in face to face counsel of the patient and in coordination of their care.      LOS: 9 days   Acey Lav 01/24/2015, 2:25 PM

## 2015-01-25 DIAGNOSIS — B370 Candidal stomatitis: Secondary | ICD-10-CM

## 2015-01-25 DIAGNOSIS — K7469 Other cirrhosis of liver: Secondary | ICD-10-CM

## 2015-01-25 DIAGNOSIS — T80219A Unspecified infection due to central venous catheter, initial encounter: Secondary | ICD-10-CM

## 2015-01-25 LAB — CBC
HCT: 25.2 % — ABNORMAL LOW (ref 39.0–52.0)
HEMOGLOBIN: 8.1 g/dL — AB (ref 13.0–17.0)
MCH: 24.7 pg — AB (ref 26.0–34.0)
MCHC: 32.1 g/dL (ref 30.0–36.0)
MCV: 76.8 fL — AB (ref 78.0–100.0)
Platelets: 59 10*3/uL — ABNORMAL LOW (ref 150–400)
RBC: 3.28 MIL/uL — ABNORMAL LOW (ref 4.22–5.81)
RDW: 20.5 % — ABNORMAL HIGH (ref 11.5–15.5)
WBC: 4.4 10*3/uL (ref 4.0–10.5)

## 2015-01-25 LAB — GLUCOSE, CAPILLARY
GLUCOSE-CAPILLARY: 121 mg/dL — AB (ref 70–99)
GLUCOSE-CAPILLARY: 162 mg/dL — AB (ref 70–99)
Glucose-Capillary: 87 mg/dL (ref 70–99)
Glucose-Capillary: 124 mg/dL — ABNORMAL HIGH (ref 70–99)
Glucose-Capillary: 143 mg/dL — ABNORMAL HIGH (ref 70–99)

## 2015-01-25 LAB — MAGNESIUM: MAGNESIUM: 2.3 mg/dL (ref 1.5–2.5)

## 2015-01-25 NOTE — Progress Notes (Signed)
[name] TEAM 1 - Stepdown/ICU TEAM Progress Note  David [name] XBM:[id number] DOB: 05-Feb-1976 DOA: 01/15/2015 PCP: David Asa, MD  Admit HPI / Brief Narrative: 39 yo WM PMHx Crohn's disease and also has history of previous peptic ulcer disease. He is awaiting small bowel transplantation at [facility]. He also has had gastrojejunostomy in 2004 gated by abdominal wound and resulting Bovine Mesh (per patient), chronic gastroparesis. He also has cirrhosis of the liver most likely secondary to [place]. He recently was admitted for aspiration pneumonia. He now gives a 2 day history of dyspnea with coughing today. He is also had fevers. His oxygen saturation is very low and is now requiring a nonrebreather mask to maintain saturations above 90%. Chest x-ray suggestive of pneumonia, probably aspiration. He is now going to be admitted for further management.  HPI/Subjective: 2/12 A/O 4, patient's confusion appears to have resolved, NAD   Assessment/Plan: Severe sepsis/Persistent fungemia/Suspected HCAP -Continue below antibiotics -Repeat blood cultures on 2/13  -Spoke with GI and infectious disease both felt that although PEG may be a nidus for infection unlikely, therefore leave in place. -Cardiology to perform TEE Monday  Hypoxic respiratory failure -See severe sepsis  Acute renal failure (Cr baseline 0.70 in 11/15) - Improving, continue normal saline at 64ml/hr  Crohn's disease of small and large bowel.  -Per patient awaiting small bowel and liver transplant at [facility]  Chronic gastroparesis -Patient has a PEG tube a minimum of 2 months; GI believes that could be a nidus for infection should leave in. ID agrees -PEG tube fluid for culture and stain pending  Protein calorie malnutrition, severe. -TPN stopped secondary to sepsis -Patient on clear liquid diet, and tolerating well     Code Status: FULL Family Communication: no family present at time of exam Disposition Plan: Resolution  sepsis; transferred to [facility] for transplant?    Consultants:  Dr.Cornelius N Daiva Eves (infectious disease) Dr.Ryan B Sanford (nephrology) Dr.Daniel Daneil Dan [facility] M)   Procedure/Significant Events: 2/03 - intubated, transferred to [facility] ICU. Started on ARDS protocol 2/03 Renal consult: CRRT initiated for mgmt of severe acidosis and hyperkalemia 2/03 Gen surg consult: CT abdomen ordered. Noted that pt was difficult surgery @ [facility] 2/04 CT abdomen and pelvis: Diffuse abnormal appearance of the bowel with multifocal bowel wall thick in. There is probable pneumatosis involving small bowel in the lower abdomen. No definite perforation. There is increased mesenteric edema and free fluid from prior exam. Decreased hydronephrosis. Splenomegaly. Worsening airspace disease in the lower lungs, left greater than Right 2/04 ID consult for fungemia: Micafungin added 2/07 Ventilator dyssynchrony with appearance of proximal airway obstruction. Improved with suctioning. Off vasopressors. Agitation difficult to control.  2/08 Passed SBT. Remains agitated. Extubated and tolerating. Weaning vasopressors to off. Persistent fungemia on F/U BCs. ID service recommends echocardiogram and change of indwelling catheters/lines. Uo picking up with furosemide. CRRT continued.  2/09 TTE: The estimated ejection fraction was 65%. Wall motion was normal; there were no regional wall motion abnormalities. Slight dilitation of aortic root. No obvious vegetations seen 2/09 Transfer to SDU ordered   Culture MRSA PCR 2/02 >> NEG Urine 2/04 >> NEG Resp 2/03 >> NEG Blood 2/02 >> 2/2 yeast Blood 2/03 >> 2/2 candida glabrata Blood 2/04 >> 1/2 yeast Resp 2/04 >> NOF Blood 2/08 >>   Antibiotics: Clinda 2/02 >> stopped 2/02 Cefepime 2/02 >> stopped 2/02 Vanc 2/02 >> stopped 2/09 Pip-tazo 2/02 >> stopped 2/09 Micafungin 2/03 >>   DVT prophylaxis:  Devices PEG tube placed~December 2015   LINES / TUBES:  ETT 2/03 >>  stopped 2/08 R femoral A-line 2/03 >> stopped 2/08 R IJ HD cath 2/04 >> stopped 2/08 L IJ CVL 2/04 >> stopped 2/09    Continuous Infusions: . sodium chloride 75 mL/hr at 01/24/15 1036    Objective: VITAL SIGNS: Temp: 97.7 F (36.5 C) (02/12 1938) Temp Source: Oral (02/12 1938) BP: 115/66 mmHg (02/12 1938) Pulse Rate: 81 (02/12 1938) SPO2; FIO2:   Intake/Output Summary (Last 24 hours) at 01/25/15 1943 Last data filed at 01/25/15 1938  Gross per 24 hour  Intake   1875 ml  Output   4200 ml  Net  -2325 ml     Exam: General: A/O 4, NAD, No acute respiratory distress Lungs: Clear to auscultation bilaterally without wheezes or crackles Cardiovascular: Regular rate and rhythm without murmur gallop or rub normal S1 and S2 Abdomen: tender RUQ, distended, soft, bowel sounds positive, no rebound, no ascites, no appreciable mass Extremities: No significant cyanosis, clubbing, or edema bilateral lower extremities  Data Reviewed: Basic Metabolic Panel:  Recent Labs Lab 01/20/15 1600 01/21/15 0349 01/21/15 1640 01/22/15 0420 01/23/15 0237 01/24/15 0318 01/25/15 0326  NA 133* 135 134* 136 140 138  --   K 4.0 3.3* 3.9 3.0* 3.4* 3.5  --   CL 101 101 101 101 103 102  --   CO2 27 28 28 27 23 27   --   GLUCOSE 216* 178* 183* 284* 145* 146*  --   BUN 50* 46* 40* 63* 87* 84*  --   CREATININE 1.11 1.06 1.00 1.31 1.32 1.36*  --   CALCIUM 6.9* 7.3* 7.4* 7.7* 7.4* 7.2*  --   MG  --  2.3  --  2.0 2.1 2.4 2.3  PHOS 3.3 3.3 2.9 4.8* 6.5*  --   --    Liver Function Tests:  Recent Labs Lab 01/19/15 0408  01/20/15 1600 01/21/15 0349 01/21/15 1640 01/22/15 0420 01/23/15 0237  AST 122*  --   --  60*  --   --   --   ALT 98*  --   --  75*  --   --   --   ALKPHOS 130*  --   --  178*  --   --   --   BILITOT 4.8*  --   --  5.8*  --   --   --   PROT 4.4*  --   --  5.3*  --   --   --   ALBUMIN 2.1*  < > 2.0* 2.3* 2.3* 2.1* 2.4*  < > = values in this interval not displayed. No  results for input(s): LIPASE, AMYLASE in the last 168 hours. No results for input(s): AMMONIA in the last 168 hours. CBC:  Recent Labs Lab 01/21/15 0349 01/22/15 0420 01/23/15 0237 01/24/15 0318 01/25/15 0326  WBC 8.9 6.4 8.2 9.7 4.4  NEUTROABS 7.0  --   --   --   --   HGB 8.4* 7.3* 8.5* 9.2* 8.1*  HCT 26.3* 22.4* 25.6* 27.6* 25.2*  MCV 79.9 78.0 75.7* 76.0* 76.8*  PLT 26* 33* 53* 99* 59*   Cardiac Enzymes:  Recent Labs Lab 01/19/15 0015 01/19/15 0408  TROPONINI 0.03 <0.03   BNP (last 3 results) No results for input(s): BNP in the last 8760 hours.  ProBNP (last 3 results) No results for input(s): PROBNP in the last 8760 hours.  CBG:  Recent Labs Lab 01/24/15 2315 01/25/15  1610 01/25/15 0732 01/25/15 1205 01/25/15 1544  GLUCAP 108* 121* 162* 87 124*    Recent Results (from the past 240 hour(s))  Culture, respiratory (tracheal aspirate)     Status: None   Collection Time: 01/16/15  5:46 PM  Result Value Ref Range Status   Specimen Description TRACHEAL ASPIRATE  Final   Special Requests Normal  Final   Gram Stain   Final    FEW WBC PRESENT, PREDOMINANTLY PMN NO SQUAMOUS EPITHELIAL CELLS SEEN NO ORGANISMS SEEN Performed at [facility]    Culture   Final    NO GROWTH 2 DAYS Performed at [facility]    Report Status 01/19/2015 FINAL  Final  Culture, blood (routine x 2)     Status: None   Collection Time: 01/16/15  6:30 PM  Result Value Ref Range Status   Specimen Description BLOOD LEFT ARM  Final   Special Requests BOTTLES DRAWN AEROBIC AND ANAEROBIC 5CC  Final   Culture   Final    CANDIDA PARAPSILOSIS Note: Gram Stain Report Called to,Read Back By and Verified With: PHYLLIS MCKINNEY ON 2.5.2016 AT 7:30P BY WILEJ Performed at [facility]    Report Status 01/21/2015 FINAL  Final  Culture, blood (routine x 2)     Status: None   Collection Time: 01/16/15  6:45 PM  Result Value Ref Range Status   Specimen Description BLOOD  LEFT HAND  Final   Special Requests BOTTLES DRAWN AEROBIC AND ANAEROBIC 5CC  Final   Culture   Final    CANDIDA GLABRATA Note: Gram Stain Report Called to,Read Back By and Verified With: CHRISTINE MATHERLY 01/19/15 0455A [facility] Performed at [facility]    Report Status 01/21/2015 FINAL  Final  Culture, respiratory (NON-Expectorated)     Status: None   Collection Time: 01/17/15  8:40 AM  Result Value Ref Range Status   Specimen Description TRACHEAL SITE  Final   Special Requests NONE  Final   Gram Stain   Final    FEW WBC PRESENT,BOTH PMN AND MONONUCLEAR RARE SQUAMOUS EPITHELIAL CELLS PRESENT NO ORGANISMS SEEN Performed at [facility]    Culture   Final    Non-Pathogenic Oropharyngeal-type Flora Isolated. Performed at [facility]    Report Status 01/20/2015 FINAL  Final  Culture, blood (routine x 2)     Status: None   Collection Time: 01/17/15  1:13 PM  Result Value Ref Range Status   Specimen Description BLOOD LEFT HAND  Final   Special Requests BOTTLES DRAWN AEROBIC ONLY 7.5CC  Final   Culture   Final    NO GROWTH 5 DAYS Performed at [facility]    Report Status 01/23/2015 FINAL  Final  Culture, blood (routine x 2)     Status: None   Collection Time: 01/17/15  6:50 PM  Result Value Ref Range Status   Specimen Description BLOOD LEFT HAND  Final   Special Requests   Final    BOTTLES DRAWN AEROBIC AND ANAEROBIC 5CC AER,2CC ANA   Culture   Final    CANDIDA PARAPSILOSIS Note: Gram Stain Report Called to,Read Back By and Verified With: P PATEL RN 01/20/15 AT 1020 AM BY [facility] Performed at [facility]    Report Status 01/23/2015 FINAL  Final  Urine culture     Status: None   Collection Time: 01/17/15  8:42 PM  Result Value Ref Range Status   Specimen Description URINE, CATHETERIZED  Final  Special Requests Normal  Final   Colony Count NO GROWTH Performed at [facility]   Final   Culture NO GROWTH Performed at [facility]   Final   Report Status 01/19/2015 FINAL  Final  Culture, blood (routine x 2)     Status: None (Preliminary result)   Collection Time: 01/21/15 12:57 PM  Result Value Ref Range Status   Specimen Description BLOOD RIGHT ANTECUBITAL  Final   Special Requests BOTTLES DRAWN AEROBIC ONLY 6CC  Final   Culture   Final           BLOOD CULTURE RECEIVED NO GROWTH TO DATE CULTURE WILL BE HELD FOR 5 DAYS BEFORE ISSUING A FINAL NEGATIVE REPORT Performed at [facility]    Report Status PENDING  Incomplete  Culture, blood (routine x 2)     Status: None (Preliminary result)   Collection Time: 01/21/15  2:07 PM  Result Value Ref Range Status   Specimen Description BLOOD RIGHT ARM  Final   Special Requests BOTTLES DRAWN AEROBIC AND ANAEROBIC 5CC  Final   Culture   Final           BLOOD CULTURE RECEIVED NO GROWTH TO DATE CULTURE WILL BE HELD FOR 5 DAYS BEFORE ISSUING A FINAL NEGATIVE REPORT Performed at [facility]    Report Status PENDING  Incomplete  Culture, blood (single)     Status: None (Preliminary result)   Collection Time: 01/22/15  9:44 PM  Result Value Ref Range Status   Specimen Description BLOOD RIGHT ARM  Final   Special Requests   Final    BOTTLES DRAWN AEROBIC AND ANAEROBIC 10CC BLUE 8CC PURPLE   Culture   Final           BLOOD CULTURE RECEIVED NO GROWTH TO DATE CULTURE WILL BE HELD FOR 5 DAYS BEFORE ISSUING A FINAL NEGATIVE REPORT Note: Culture results may be compromised due to an excessive volume of blood received in culture bottles. Performed at [facility]    Report Status PENDING  Incomplete  Culture, blood (routine x 2)     Status: None (Preliminary result)   Collection Time: 01/23/15  9:50 AM  Result Value Ref Range Status   Specimen Description BLOOD LEFT ARM  Final   Special Requests BOTTLES DRAWN AEROBIC ONLY 6CC  Final   Culture   Final           BLOOD CULTURE RECEIVED NO GROWTH TO DATE CULTURE WILL BE HELD FOR 5 DAYS BEFORE  ISSUING A FINAL NEGATIVE REPORT Performed at [facility]    Report Status PENDING  Incomplete  Culture, blood (routine x 2)     Status: None (Preliminary result)   Collection Time: 01/23/15 10:00 AM  Result Value Ref Range Status   Specimen Description BLOOD LEFT HAND  Final   Special Requests BOTTLES DRAWN AEROBIC ONLY 5CC  Final   Culture   Final           BLOOD CULTURE RECEIVED NO GROWTH TO DATE CULTURE WILL BE HELD FOR 5 DAYS BEFORE ISSUING A FINAL NEGATIVE REPORT Performed at [facility]    Report Status PENDING  Incomplete  Body fluid culture     Status: None (Preliminary result)   Collection Time: 01/25/15  6:15 AM  Result Value Ref Range Status   Specimen Description GASTRIC  Final   Special Requests Normal  Final   Gram Stain   Final    NO  WBC SEEN NO ORGANISMS SEEN Performed at [facility]    Culture PENDING  Incomplete   Report Status PENDING  Incomplete     Studies:  Recent x-ray studies have been reviewed in detail by the Attending Physician  Scheduled Meds:  Scheduled Meds: . chlorhexidine  15 mL Mouth Rinse BID  . hydrocortisone sod succinate (SOLU-CORTEF) inj  25 mg Intravenous Q12H  . insulin aspart  0-20 Units Subcutaneous 6 times per day  . micafungin (MYCAMINE) IV  100 mg Intravenous QHS  . pantoprazole (PROTONIX) IV  40 mg Intravenous Q12H    Time spent on care of this patient: 40 mins   Drema Dallas [facility]  [facility] Office  [phone] Pager - [phone]  On-Call/Text Page:      [email or web address]      password [email or web address]  If 7PM-7AM, please contact night-coverage [email or web address] Password [facility] 01/25/2015, 7:43 PM   LOS: 10 days   Care during the described time interval was provided by me .  I have reviewed this patient's available data, including medical history, events of note, physical examination, radiology studies and test results as part of my evaluation  Carolyne Littles, MD [phone] Pager

## 2015-01-25 NOTE — Progress Notes (Signed)
[facility]       Subjective:  Feels better today   Antibiotics:  Anti-infectives    Start     Dose/Rate Route Frequency Ordered Stop   01/22/15 0200  piperacillin-tazobactam (ZOSYN) IVPB 3.375 g  Status:  Discontinued     3.375 g 12.5 mL/hr over 240 Minutes Intravenous 3 times per day 01/22/15 0013 01/22/15 1036   01/20/15 1800  vancomycin (VANCOCIN) IVPB 750 mg/150 ml premix  Status:  Discontinued     750 mg 150 mL/hr over 60 Minutes Intravenous Every 12 hours 01/20/15 1753 01/22/15 1036   01/17/15 1800  vancomycin (VANCOCIN) IVPB 1000 mg/200 mL premix  Status:  Discontinued     1,000 mg 200 mL/hr over 60 Minutes Intravenous Every 24 hours 01/17/15 0140 01/20/15 1753   01/17/15 0600  piperacillin-tazobactam (ZOSYN) IVPB 3.375 g  Status:  Discontinued     3.375 g 100 mL/hr over 30 Minutes Intravenous 4 times per day 01/17/15 0140 01/22/15 0013   01/16/15 2300  micafungin (MYCAMINE) 100 mg in sodium chloride 0.9 % 100 mL IVPB     100 mg 100 mL/hr over 1 Hours Intravenous Daily at bedtime 01/16/15 2140     01/16/15 2200  piperacillin-tazobactam (ZOSYN) IVPB 3.375 g  Status:  Discontinued     3.375 g 12.5 mL/hr over 240 Minutes Intravenous 3 times per day 01/16/15 1733 01/17/15 0133   01/16/15 1800  vancomycin (VANCOCIN) IVPB 1000 mg/200 mL premix  Status:  Discontinued     1,000 mg 200 mL/hr over 60 Minutes Intravenous Every 8 hours 01/16/15 1733 01/17/15 0134   01/16/15 0400  vancomycin (VANCOCIN) IVPB 1000 mg/200 mL premix  Status:  Discontinued     1,000 mg 200 mL/hr over 60 Minutes Intravenous Every 12 hours 01/15/15 1836 01/16/15 1710   01/15/15 2200  piperacillin-tazobactam (ZOSYN) IVPB 3.375 g  Status:  Discontinued     3.375 g 12.5 mL/hr over 240 Minutes Intravenous 3 times per day 01/15/15 1853 01/16/15 1710   01/15/15 1600  vancomycin (VANCOCIN) IVPB 1000 mg/200 mL premix     1,000 mg 200 mL/hr over 60 Minutes Intravenous  Once  01/15/15 1557 01/15/15 1754   01/15/15 1500  ceFEPIme (MAXIPIME) 2 g in dextrose 5 % 50 mL IVPB     2 g 100 mL/hr over 30 Minutes Intravenous  Once 01/15/15 1458 01/15/15 1540   01/15/15 1500  clindamycin (CLEOCIN) IVPB 600 mg     600 mg 100 mL/hr over 30 Minutes Intravenous  Once 01/15/15 1458 01/15/15 1537      Medications: Scheduled Meds: . chlorhexidine  15 mL Mouth Rinse BID  . hydrocortisone sod succinate (SOLU-CORTEF) inj  25 mg Intravenous Q12H  . insulin aspart  0-20 Units Subcutaneous 6 times per day  . micafungin (MYCAMINE) IV  100 mg Intravenous QHS  . pantoprazole (PROTONIX) IV  40 mg Intravenous Q12H   Continuous Infusions: . sodium chloride 75 mL/hr at 01/24/15 1036   PRN Meds:.sodium chloride, albuterol, fentaNYL    Objective: Weight change: 3 lb 1.4 oz (1.4 kg)  Intake/Output Summary (Last 24 hours) at 01/25/15 2005 Last data filed at 01/25/15 1938  Gross per 24 hour  Intake   1800 ml  Output   4200 ml  Net  -2400 ml   Blood pressure 115/66, pulse 81, temperature 97.7 F (36.5 C), temperature source Oral, resp. rate 15, height  (1.803 m), weight 158 lb 1.1 oz (71.7 kg), SpO2  100 %. Temp:  [97.7 F (36.5 C)-98.6 F (37 C)] 97.7 F (36.5 C) (02/12 1938) Pulse Rate:  [70-83] 81 (02/12 1938) Resp:  [13-21] 15 (02/12 1938) BP: (103-115)/(56-68) 115/66 mmHg (02/12 1938) SpO2:  [96 %-100 %] 100 % (02/12 1938) Weight:  [158 lb 1.1 oz (71.7 kg)] 158 lb 1.1 oz (71.7 kg) (02/12 0456)  Physical Exam: General: Alert oriented conversant oriented x 3 HEENT: Normocephalic,  CVS regular rate, normal r, no murmur rubs or gallops Chest:rhonchi Abdomen: soft, bandage in place.  Extremities: 2+ edema Neuro: nonfocal   CBC:   CBC Latest Ref Rng 01/25/2015 01/24/2015 01/23/2015  WBC 4.0 - 10.5 K/uL 4.4 9.7 8.2  Hemoglobin 13.0 - 17.0 g/dL 8.1(L) 9.2(L) 8.5(L)  Hematocrit 39.0 - 52.0 % 25.2(L) 27.6(L) 25.6(L)  Platelets 150 - 400 K/uL 59(L) 99(L) 53(L)        BMET  Recent Labs  01/23/15 0237 01/24/15 0318  NA 140 138  K 3.4* 3.5  CL 103 102  CO2 23 27  GLUCOSE 145* 146*  BUN 87* 84*  CREATININE 1.32 1.36*  CALCIUM 7.4* 7.2*     Liver Panel   Recent Labs  01/23/15 0237  ALBUMIN 2.4*       Sedimentation Rate No results for input(s): ESRSEDRATE in the last 72 hours. C-Reactive Protein No results for input(s): CRP in the last 72 hours.  Micro Results: Recent Results (from the past 720 hour(s))  Clostridium Difficile by PCR     Status: None   Collection Time: 12/26/14 10:16 PM  Result Value Ref Range Status   C difficile by pcr NEGATIVE NEGATIVE Final  Wound culture     Status: None   Collection Time: 12/31/14 12:54 AM  Result Value Ref Range Status   Specimen Description WOUND ABDOMEN  Final   Special Requests IMMUNE:COMPROMISED  Final   Gram Stain   Final    RARE WBC PRESENT, PREDOMINANTLY MONONUCLEAR NO SQUAMOUS EPITHELIAL CELLS SEEN RARE GRAM POSITIVE COCCI IN PAIRS IN CLUSTERS FEW YEAST Performed at [facility]    Culture   Final    MULTIPLE ORGANISMS PRESENT, NONE PREDOMINANT Note: NO GROUP A STREP (S.PYOGENES) ISOLATED NO STAPHYLOCOCCUS AUREUS ISOLATED Performed at [facility]    Report Status 01/03/2015 FINAL  Final  Blood culture (routine x 2)     Status: None   Collection Time: 01/15/15  3:07 PM  Result Value Ref Range Status   Specimen Description BLOOD LEFT ANTECUBITAL  Final   Special Requests BOTTLES DRAWN AEROBIC AND ANAEROBIC [facility]  Final   Culture   Final    CANDIDA GLABRATA CANDIDA PARAPSILOSIS Note: Gram Stain Report Called to,Read Back By and Verified With: [facility] [place] 1803 01/16/15 BY Ginette Pitman Performed at [facility] Performed at [facility]    Report Status 01/22/2015 FINAL  Final  Blood culture (routine x 2)     Status: None   Collection Time: 01/15/15  3:12 PM  Result Value Ref Range Status   Specimen Description BLOOD LEFT ARM   Final   Special Requests BOTTLES DRAWN AEROBIC AND ANAEROBIC 8CC  Final   Culture   Final    CANDIDA GLABRATA CANDIDA PARAPSILOSIS Note: Gram Stain Report Called to,Read Back By and Verified With: [facility] [place] 1803 01/16/15 BY Ginette Pitman Performed at [facility] Performed at [facility]    Report Status 01/22/2015 FINAL  Final  MRSA PCR Screening     Status: None  Collection Time: 01/15/15  6:33 PM  Result Value Ref Range Status   MRSA by PCR NEGATIVE NEGATIVE Final    Comment:        The GeneXpert MRSA Assay (FDA approved for NASAL specimens only), is one component of a comprehensive MRSA colonization surveillance program. It is not intended to diagnose MRSA infection nor to guide or monitor treatment for MRSA infections.   Culture, respiratory (tracheal aspirate)     Status: None   Collection Time: 01/16/15  5:46 PM  Result Value Ref Range Status   Specimen Description TRACHEAL ASPIRATE  Final   Special Requests Normal  Final   Gram Stain   Final    FEW WBC PRESENT, PREDOMINANTLY PMN NO SQUAMOUS EPITHELIAL CELLS SEEN NO ORGANISMS SEEN Performed at [facility]    Culture   Final    NO GROWTH 2 DAYS Performed at [facility]    Report Status 01/19/2015 FINAL  Final  Culture, blood (routine x 2)     Status: None   Collection Time: 01/16/15  6:30 PM  Result Value Ref Range Status   Specimen Description BLOOD LEFT ARM  Final   Special Requests BOTTLES DRAWN AEROBIC AND ANAEROBIC 5CC  Final   Culture   Final    CANDIDA PARAPSILOSIS Note: Gram Stain Report Called to,Read Back By and Verified With: PHYLLIS MCKINNEY ON 2.5.2016 AT 7:30P BY WILEJ Performed at [facility]    Report Status 01/21/2015 FINAL  Final  Culture, blood (routine x 2)     Status: None   Collection Time: 01/16/15  6:45 PM  Result Value Ref Range Status   Specimen Description BLOOD LEFT HAND  Final   Special Requests BOTTLES DRAWN AEROBIC AND ANAEROBIC 5CC   Final   Culture   Final    CANDIDA GLABRATA Note: Gram Stain Report Called to,Read Back By and Verified With: CHRISTINE MATHERLY 01/19/15 0455A [facility] Performed at [facility]    Report Status 01/21/2015 FINAL  Final  Culture, respiratory (NON-Expectorated)     Status: None   Collection Time: 01/17/15  8:40 AM  Result Value Ref Range Status   Specimen Description TRACHEAL SITE  Final   Special Requests NONE  Final   Gram Stain   Final    FEW WBC PRESENT,BOTH PMN AND MONONUCLEAR RARE SQUAMOUS EPITHELIAL CELLS PRESENT NO ORGANISMS SEEN Performed at [facility]    Culture   Final    Non-Pathogenic Oropharyngeal-type Flora Isolated. Performed at [facility]    Report Status 01/20/2015 FINAL  Final  Culture, blood (routine x 2)     Status: None   Collection Time: 01/17/15  1:13 PM  Result Value Ref Range Status   Specimen Description BLOOD LEFT HAND  Final   Special Requests BOTTLES DRAWN AEROBIC ONLY 7.5CC  Final   Culture   Final    NO GROWTH 5 DAYS Performed at [facility]    Report Status 01/23/2015 FINAL  Final  Culture, blood (routine x 2)     Status: None   Collection Time: 01/17/15  6:50 PM  Result Value Ref Range Status   Specimen Description BLOOD LEFT HAND  Final   Special Requests   Final    BOTTLES DRAWN AEROBIC AND ANAEROBIC 5CC AER,2CC ANA   Culture   Final    CANDIDA PARAPSILOSIS Note: Gram Stain Report Called to,Read Back By and Verified With: P PATEL RN 01/20/15 AT 1020 AM BY [facility] Performed at  [facility]    Report Status 01/23/2015 FINAL  Final  Urine culture     Status: None   Collection Time: 01/17/15  8:42 PM  Result Value Ref Range Status   Specimen Description URINE, CATHETERIZED  Final   Special Requests Normal  Final   Colony Count NO GROWTH Performed at [facility]   Final   Culture NO GROWTH Performed at [facility]   Final   Report Status 01/19/2015 FINAL  Final  Culture,  blood (routine x 2)     Status: None (Preliminary result)   Collection Time: 01/21/15 12:57 PM  Result Value Ref Range Status   Specimen Description BLOOD RIGHT ANTECUBITAL  Final   Special Requests BOTTLES DRAWN AEROBIC ONLY 6CC  Final   Culture   Final           BLOOD CULTURE RECEIVED NO GROWTH TO DATE CULTURE WILL BE HELD FOR 5 DAYS BEFORE ISSUING A FINAL NEGATIVE REPORT Performed at [facility]    Report Status PENDING  Incomplete  Culture, blood (routine x 2)     Status: None (Preliminary result)   Collection Time: 01/21/15  2:07 PM  Result Value Ref Range Status   Specimen Description BLOOD RIGHT ARM  Final   Special Requests BOTTLES DRAWN AEROBIC AND ANAEROBIC 5CC  Final   Culture   Final           BLOOD CULTURE RECEIVED NO GROWTH TO DATE CULTURE WILL BE HELD FOR 5 DAYS BEFORE ISSUING A FINAL NEGATIVE REPORT Performed at [facility]    Report Status PENDING  Incomplete  Culture, blood (single)     Status: None (Preliminary result)   Collection Time: 01/22/15  9:44 PM  Result Value Ref Range Status   Specimen Description BLOOD RIGHT ARM  Final   Special Requests   Final    BOTTLES DRAWN AEROBIC AND ANAEROBIC 10CC BLUE 8CC PURPLE   Culture   Final           BLOOD CULTURE RECEIVED NO GROWTH TO DATE CULTURE WILL BE HELD FOR 5 DAYS BEFORE ISSUING A FINAL NEGATIVE REPORT Note: Culture results may be compromised due to an excessive volume of blood received in culture bottles. Performed at [facility]    Report Status PENDING  Incomplete  Culture, blood (routine x 2)     Status: None (Preliminary result)   Collection Time: 01/23/15  9:50 AM  Result Value Ref Range Status   Specimen Description BLOOD LEFT ARM  Final   Special Requests BOTTLES DRAWN AEROBIC ONLY 6CC  Final   Culture   Final           BLOOD CULTURE RECEIVED NO GROWTH TO DATE CULTURE WILL BE HELD FOR 5 DAYS BEFORE ISSUING A FINAL NEGATIVE REPORT Performed at [facility]     Report Status PENDING  Incomplete  Culture, blood (routine x 2)     Status: None (Preliminary result)   Collection Time: 01/23/15 10:00 AM  Result Value Ref Range Status   Specimen Description BLOOD LEFT HAND  Final   Special Requests BOTTLES DRAWN AEROBIC ONLY 5CC  Final   Culture   Final           BLOOD CULTURE RECEIVED NO GROWTH TO DATE CULTURE WILL BE HELD FOR 5 DAYS BEFORE ISSUING A FINAL NEGATIVE REPORT Performed at [facility]    Report Status PENDING  Incomplete  Body fluid culture  Status: None (Preliminary result)   Collection Time: 01/25/15  6:15 AM  Result Value Ref Range Status   Specimen Description GASTRIC  Final   Special Requests Normal  Final   Gram Stain   Final    NO WBC SEEN NO ORGANISMS SEEN Performed at [facility]    Culture PENDING  Incomplete   Report Status PENDING  Incomplete    Studies/Results: No results found.    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   Hypertension   Liver cirrhosis   Protein-calorie malnutrition, severe   Aspiration pneumonia   Anemia of chronic disease   Acute kidney injury   Acute respiratory failure with hypoxia   Thrombocytopenia   Pneumonia   Endotracheally intubated   Septic shock   Acute respiratory failure   ARDS (adult respiratory distress syndrome)   Fungemia   Candida glabrata infection   Candida parapsilosis infection   Candidemia   Hemodialysis catheter infection   Central line infection   Acute respiratory disease   Bilateral pneumonia   Severe sepsis   AKI (acute kidney injury)   Acute renal failure syndrome    David [name] is a 39 y.o. male with multiple medical problems cirrhosis Crohn's disease on TPN via PICC at home admitted with apparent healthcare associated pneumonia sepsis +/- intrabdominal infection now found to be fungemic with C glabrata and  C parapsilosis  #1 Persistently positive candidemia despite removal of his PICC line and initiation of micafungin. His hemodialysis catheter femoral arterial line and internal jugular vein line are all undoubtedly seeded with Candida and responsible for his persistent candidemia.  ALL LINES ARE OUT  --REPEAT BLOOD CULTURES ONCE LINES ALL OUT NGTD  --would give  "HOLIDAY FROM TPN AND CENTRAL LINE"  WITH NEGATIVE repeat blood cultures  --Greatly appreciate Dr. Charlotte Sanes seeing pt and excluding fungal endophlalmitis  --greatly appreciate Cardiology performing TEE on Monday  --if TEE negative for vegetations would continue micafungin through 02/05/15 and dcing them on 02/06/15  I will followup his TEE result on Monday and if it is normal sign off   If abnormal I will reengage and we would also need CT surgery involvment  Please call with further questions.    LOS: 10 days   Acey Lav 01/25/2015, 8:05 PM

## 2015-01-25 NOTE — Progress Notes (Signed)
   Patient is scheduled for TEE Monday 01/28/15 with Dr. Royann Shivers to rule out endocarditis in the setting of bacteremia. I explained purpose, procedural details and potential risk. All questions answered. The patient did voice concerns regarding history of esophageal varices due to cirrhosis but denies history of bleeds. I informed patient that I would make Dr. Royann Shivers aware.  Patient is in agreement to proceed with procedure. RN will have patient sign consent over the weekend. Orders are in place. He will be made NPO at midnight on 01/28/15.    David [name], David [name] 01/25/2015

## 2015-01-25 NOTE — Progress Notes (Signed)
Physical Therapy Treatment Patient Details Name: ABHIMANYU STRZELCZYK MRN: [id number] DOB: [birth year] Today's Date: 01/25/2015    History of Present Illness 39 y.o. M who presented to [facility] ED on 2/2 with 2 days of dyspnea, cough, fevers.  He was found to be hypoxic requiring NRB and CXR suggestive of HCAP/ARDS/ fungemia. Pt with Chron's awaiting small bowel transplant at [facility]    PT Comments    Pt seems down, but agreeable to ambulation today.  Pt was able to increase ambulation distance and able to perform with only UE on IV pole.  Encouraged pt to ambulate more during the day with the nursing staff.  Will continue to follow along.    Follow Up Recommendations  Home health PT;Supervision for mobility/OOB     Equipment Recommendations  Rolling walker with 5" wheels;3in1 (PT)    Recommendations for Other Services       Precautions / Restrictions Precautions Precautions: Fall Precaution Comments: peg Restrictions Weight Bearing Restrictions: No    Mobility  Bed Mobility Overal bed mobility: Modified Independent             General bed mobility comments: pt needs increased time and moves slowly, but was able to complete without A.    Transfers Overall transfer level: Needs assistance Equipment used: None Transfers: Sit to/from Stand Sit to Stand: Min guard         General transfer comment: pt demos good use of UEs and awareness of safety.    Ambulation/Gait Ambulation/Gait assistance: Min guard Ambulation Distance (Feet): 400 Feet Assistive device:  (IV pole) Gait Pattern/deviations: Step-through pattern;Decreased stride length     General Gait Details: pt moves slowly and with cautious gait.  pt utilized IV pole for UE support.  pt able to increase ambulation distance today.     Stairs            Wheelchair Mobility    Modified Rankin (Stroke Patients Only)       Balance Overall balance assessment: Needs assistance Sitting-balance support: No upper  extremity supported;Feet supported Sitting balance-Leahy Scale: Good     Standing balance support: No upper extremity supported Standing balance-Leahy Scale: Fair Standing balance comment: pt has difficulty with balance challenges and requires a single UE support.                      Cognition Arousal/Alertness: Awake/alert Behavior During Therapy: Flat affect Overall Cognitive Status: Within Functional Limits for tasks assessed                      Exercises      General Comments        Pertinent Vitals/Pain Pain Assessment: 0-10 Pain Score: 6  Pain Location: Abdomen Pain Descriptors / Indicators: Aching Pain Intervention(s): Monitored during session;Premedicated before session;Repositioned    Home Living                      Prior Function            PT Goals (current goals can now be found in the care plan section) Acute Rehab PT Goals Patient Stated Goal: go home PT Goal Formulation: With patient Time For Goal Achievement: 02/05/15 Potential to Achieve Goals: Good Progress towards PT goals: Progressing toward goals    Frequency  Min 3X/week    PT Plan Current plan remains appropriate    Co-evaluation  End of Session Equipment Utilized During Treatment: Gait belt Activity Tolerance: Patient tolerated treatment well Patient left: in bed;with call bell/phone within reach     Time: 1324-4010 PT Time Calculation (min) (ACUTE ONLY): 25 min  Charges:  $Gait Training: 23-37 mins                    G CodesSunny Schlein, [place] [phone] 01/25/2015, 2:06 PM

## 2015-01-26 DIAGNOSIS — E876 Hypokalemia: Secondary | ICD-10-CM | POA: Diagnosis present

## 2015-01-26 DIAGNOSIS — B377 Candidal sepsis: Principal | ICD-10-CM

## 2015-01-26 LAB — COMPREHENSIVE METABOLIC PANEL
ALBUMIN: 2.4 g/dL — AB (ref 3.5–5.2)
ALT: 56 U/L — ABNORMAL HIGH (ref 0–53)
AST: 47 U/L — ABNORMAL HIGH (ref 0–37)
Alkaline Phosphatase: 148 U/L — ABNORMAL HIGH (ref 39–117)
Anion gap: 11 (ref 5–15)
BILIRUBIN TOTAL: 5.8 mg/dL — AB (ref 0.3–1.2)
BUN: 54 mg/dL — AB (ref 6–23)
CALCIUM: 7.4 mg/dL — AB (ref 8.4–10.5)
CHLORIDE: 99 mmol/L (ref 96–112)
CO2: 24 mmol/L (ref 19–32)
Creatinine, Ser: 1 mg/dL (ref 0.50–1.35)
GFR calc Af Amer: >90 mL/min (ref >90)
GFR calc non Af Amer: >90 mL/min (ref >90)
Glucose, Bld: 141 mg/dL — ABNORMAL HIGH (ref 70–99)
Potassium: 2.9 mmol/L — ABNORMAL LOW (ref 3.5–5.1)
Sodium: 134 mmol/L — ABNORMAL LOW (ref 135–145)
Total Protein: 6.3 g/dL (ref 6.0–8.3)

## 2015-01-26 LAB — GLUCOSE, CAPILLARY
GLUCOSE-CAPILLARY: 130 mg/dL — AB (ref 70–99)
Glucose-Capillary: 139 mg/dL — ABNORMAL HIGH (ref 70–99)
Glucose-Capillary: 96 mg/dL (ref 70–99)
Glucose-Capillary: 151 mg/dL — ABNORMAL HIGH (ref 70–99)
Glucose-Capillary: 122 mg/dL — ABNORMAL HIGH (ref 70–99)
Glucose-Capillary: 84 mg/dL (ref 70–99)
Glucose-Capillary: 94 mg/dL (ref 70–99)

## 2015-01-26 LAB — CBC WITH DIFFERENTIAL/PLATELET
Basophils Absolute: 0 10*3/uL (ref 0.0–0.1)
Basophils Relative: 0 % (ref 0–1)
Eosinophils Absolute: 0 10*3/uL (ref 0.0–0.7)
Eosinophils Relative: 1 % (ref 0–5)
HEMATOCRIT: 25.5 % — AB (ref 39.0–52.0)
HEMOGLOBIN: 8.4 g/dL — AB (ref 13.0–17.0)
Lymphocytes Relative: 8 % — ABNORMAL LOW (ref 12–46)
Lymphs Abs: 0.3 10*3/uL — ABNORMAL LOW (ref 0.7–4.0)
MCH: 25.3 pg — AB (ref 26.0–34.0)
MCHC: 32.9 g/dL (ref 30.0–36.0)
MCV: 76.8 fL — AB (ref 78.0–100.0)
MONO ABS: 0.6 10*3/uL (ref 0.1–1.0)
Monocytes Relative: 14 % — ABNORMAL HIGH (ref 3–12)
Neutro Abs: 3.1 10*3/uL (ref 1.7–7.7)
Neutrophils Relative %: 77 % (ref 43–77)
Platelets: 70 10*3/uL — ABNORMAL LOW (ref 150–400)
RBC: 3.32 MIL/uL — AB (ref 4.22–5.81)
RDW: 20.2 % — ABNORMAL HIGH (ref 11.5–15.5)
WBC: 4 10*3/uL (ref 4.0–10.5)

## 2015-01-26 LAB — MAGNESIUM
Magnesium: 2.1 mg/dL (ref 1.5–2.5)
Magnesium: 2.2 mg/dL (ref 1.5–2.5)

## 2015-01-26 LAB — POTASSIUM: POTASSIUM: 3.6 mmol/L (ref 3.5–5.1)

## 2015-01-26 LAB — PHOSPHORUS: Phosphorus: 4.2 mg/dL (ref 2.3–4.6)

## 2015-01-26 MED ORDER — POTASSIUM CHLORIDE 10 MEQ/100ML IV SOLN
10.0000 meq | INTRAVENOUS | Status: AC
  Administered 2015-01-26 (×5): 10 meq via INTRAVENOUS
  Filled 2015-01-26 (×5): qty 100

## 2015-01-26 NOTE — Progress Notes (Signed)
[place] TEAM 1 - Stepdown/ICU TEAM Progress Note  David [name] WUJ:[id number] DOB: 1976-03-21 DOA: 01/15/2015 PCP: [name], MD  Admit HPI / Brief Narrative: 39 yo WM PMHx Crohn's disease and also has history of previous peptic ulcer disease. He is awaiting small bowel transplantation at [facility]. He also has had gastrojejunostomy in 2004 gated by abdominal wound and resulting Bovine Mesh (per patient), chronic gastroparesis. He also has cirrhosis of the liver most likely secondary to [place]. He recently was admitted for aspiration pneumonia. He now gives a 2 day history of dyspnea with coughing today. He is also had fevers. His oxygen saturation is very low and is now requiring a nonrebreather mask to maintain saturations above 90%. Chest x-ray suggestive of pneumonia, probably aspiration. He is now going to be admitted for further management.  HPI/Subjective: 2/13 A/O 4, patient's confusion appears to have resolved, NAD. Patient states abdominal pain controlled.   Assessment/Plan: Severe sepsis/Persistent fungemia/Suspected HCAP -Continue below antibiotics -Repeat blood cultures on 2/13  -Spoke with GI and infectious disease both felt that although PEG may be a nidus for infection unlikely, therefore leave in place. -Cardiology to perform TEE Monday  Hypoxic respiratory failure -See severe sepsis  Acute renal failure (Cr baseline 0.70 in 11/15) - Improving, continue normal saline at 91ml/hr  Crohn's disease of small and large bowel.  -Per patient awaiting small bowel and liver transplant at [facility]  Chronic gastroparesis -Patient has had a PEG tube a minimum of 2 months; GI believes that it could be a nidus for infection, however low probability should leave in. ID agrees -PEG tube fluid for culture and stain pending  Protein calorie malnutrition, severe. -TPN stopped secondary to sepsis -Patient on clear liquid diet, and tolerating well   Hypokalemia -Potassium IV 10 mEq 5  runs -Recheck potassium and magnesium at 1730    Code Status: FULL Family Communication: Parents present at time of exam Disposition Plan: Resolution sepsis; transferred to [facility] for transplant?    Consultants:  Dr.Cornelius N Daiva Eves (infectious disease) Dr.Ryan B Sanford (nephrology) Dr.Daniel Daneil [facility] M)   Procedure/Significant Events: 2/03 - intubated, transferred to [facility] ICU. Started on ARDS protocol 2/03 Renal consult: CRRT initiated for mgmt of severe acidosis and hyperkalemia 2/03 Gen surg consult: CT abdomen ordered. Noted that pt was difficult surgery @ [facility] 2/04 CT abdomen and pelvis: Diffuse abnormal appearance of the bowel with multifocal bowel wall thick in. There is probable pneumatosis involving small bowel in the lower abdomen. No definite perforation. There is increased mesenteric edema and free fluid from prior exam. Decreased hydronephrosis. Splenomegaly. Worsening airspace disease in the lower lungs, left greater than Right 2/04 ID consult for fungemia: Micafungin added 2/07 Ventilator dyssynchrony with appearance of proximal airway obstruction. Improved with suctioning. Off vasopressors. Agitation difficult to control.  2/08 Passed SBT. Remains agitated. Extubated and tolerating. Weaning vasopressors to off. Persistent fungemia on F/U BCs. ID service recommends echocardiogram and change of indwelling catheters/lines. Uo picking up with furosemide. CRRT continued.  2/09 TTE: The estimated ejection fraction was 65%. Wall motion was normal; there were no regional wall motion abnormalities. Slight dilitation of aortic root. No obvious vegetations seen 2/09 Transfer to SDU ordered   Culture MRSA PCR 2/02 >> NEG Urine 2/04 >> NEG Resp 2/03 >> NEG Blood 2/02 >> 2/2 yeast Blood 2/03 >> 2/2 candida glabrata Blood 2/04 >> 1/2 yeast Resp 2/04 >> NOF Blood 2/08 >>   Antibiotics: Clinda 2/02 >> stopped 2/02 Cefepime 2/02 >>  stopped 2/02 Vanc 2/02 >> stopped  2/09 Pip-tazo 2/02 >> stopped 2/09 Micafungin 2/03 >>   DVT prophylaxis:    Devices PEG tube placed~December 2015   LINES / TUBES:  ETT 2/03 >> stopped 2/08 R femoral A-line 2/03 >> stopped 2/08 R IJ HD cath 2/04 >> stopped 2/08 L IJ CVL 2/04 >> stopped 2/09    Continuous Infusions: . sodium chloride 75 mL/hr at 01/26/15 0557    Objective: VITAL SIGNS: Temp: 98.2 F (36.8 C) (02/13 1055) Temp Source: Oral (02/13 1055) BP: 114/69 mmHg (02/13 1055) SPO2; FIO2:   Intake/Output Summary (Last 24 hours) at 01/26/15 1747 Last data filed at 01/26/15 1700  Gross per 24 hour  Intake   1350 ml  Output   5200 ml  Net  -3850 ml     Exam: General: A/O 4, NAD, No acute respiratory distress Lungs: Clear to auscultation bilaterally without wheezes or crackles Cardiovascular: Regular rate and rhythm without murmur gallop or rub normal S1 and S2 Abdomen: tender RUQ, distended, soft, bowel sounds positive, no rebound, no ascites, no appreciable mass Extremities: No significant cyanosis, clubbing, or edema bilateral lower extremities  Data Reviewed: Basic Metabolic Panel:  Recent Labs Lab 01/21/15 0349 01/21/15 1640 01/22/15 0420 01/23/15 0237 01/24/15 0318 01/25/15 0326 01/26/15 0310  NA 135 134* 136 140 138  --  134*  K 3.3* 3.9 3.0* 3.4* 3.5  --  2.9*  CL 101 101 101 103 102  --  99  CO2 28 28 27 23 27   --  24  GLUCOSE 178* 183* 284* 145* 146*  --  141*  BUN 46* 40* 63* 87* 84*  --  54*  CREATININE 1.06 1.00 1.31 1.32 1.36*  --  1.00  CALCIUM 7.3* 7.4* 7.7* 7.4* 7.2*  --  7.4*  MG 2.3  --  2.0 2.1 2.4 2.3 2.1  PHOS 3.3 2.9 4.8* 6.5*  --   --  4.2   Liver Function Tests:  Recent Labs Lab 01/21/15 0349 01/21/15 1640 01/22/15 0420 01/23/15 0237 01/26/15 0310  AST 60*  --   --   --  47*  ALT 75*  --   --   --  56*  ALKPHOS 178*  --   --   --  148*  BILITOT 5.8*  --   --   --  5.8*  PROT 5.3*  --   --   --  6.3  ALBUMIN 2.3* 2.3* 2.1* 2.4* 2.4*   No  results for input(s): LIPASE, AMYLASE in the last 168 hours. No results for input(s): AMMONIA in the last 168 hours. CBC:  Recent Labs Lab 01/21/15 0349 01/22/15 0420 01/23/15 0237 01/24/15 0318 01/25/15 0326 01/26/15 0310  WBC 8.9 6.4 8.2 9.7 4.4 4.0  NEUTROABS 7.0  --   --   --   --  3.1  HGB 8.4* 7.3* 8.5* 9.2* 8.1* 8.4*  HCT 26.3* 22.4* 25.6* 27.6* 25.2* 25.5*  MCV 79.9 78.0 75.7* 76.0* 76.8* 76.8*  PLT 26* 33* 53* 99* 59* 70*   Cardiac Enzymes: No results for input(s): CKTOTAL, CKMB, CKMBINDEX, TROPONINI in the last 168 hours. BNP (last 3 results) No results for input(s): BNP in the last 8760 hours.  ProBNP (last 3 results) No results for input(s): PROBNP in the last 8760 hours.  CBG:  Recent Labs Lab 01/25/15 2317 01/26/15 0329 01/26/15 0531 01/26/15 0754 01/26/15 1211  GLUCAP 96 130* 151* 122* 84    Recent Results (from the past 240  hour(s))  Culture, blood (routine x 2)     Status: None   Collection Time: 01/16/15  6:30 PM  Result Value Ref Range Status   Specimen Description BLOOD LEFT ARM  Final   Special Requests BOTTLES DRAWN AEROBIC AND ANAEROBIC 5CC  Final   Culture   Final    CANDIDA PARAPSILOSIS Note: Gram Stain Report Called to,Read Back By and Verified With: PHYLLIS MCKINNEY ON 2.5.2016 AT 7:30P BY WILEJ Performed at [facility]    Report Status 01/21/2015 FINAL  Final  Culture, blood (routine x 2)     Status: None   Collection Time: 01/16/15  6:45 PM  Result Value Ref Range Status   Specimen Description BLOOD LEFT HAND  Final   Special Requests BOTTLES DRAWN AEROBIC AND ANAEROBIC 5CC  Final   Culture   Final    CANDIDA GLABRATA Note: Gram Stain Report Called to,Read Back By and Verified With: CHRISTINE MATHERLY 01/19/15 0455A [facility] Performed at [facility]    Report Status 01/21/2015 FINAL  Final  Culture, respiratory (NON-Expectorated)     Status: None   Collection Time: 01/17/15  8:40 AM  Result Value Ref Range  Status   Specimen Description TRACHEAL SITE  Final   Special Requests NONE  Final   Gram Stain   Final    FEW WBC PRESENT,BOTH PMN AND MONONUCLEAR RARE SQUAMOUS EPITHELIAL CELLS PRESENT NO ORGANISMS SEEN Performed at [facility]    Culture   Final    Non-Pathogenic Oropharyngeal-type Flora Isolated. Performed at [facility]    Report Status 01/20/2015 FINAL  Final  Culture, blood (routine x 2)     Status: None   Collection Time: 01/17/15  1:13 PM  Result Value Ref Range Status   Specimen Description BLOOD LEFT HAND  Final   Special Requests BOTTLES DRAWN AEROBIC ONLY 7.5CC  Final   Culture   Final    NO GROWTH 5 DAYS Performed at [facility]    Report Status 01/23/2015 FINAL  Final  Culture, blood (routine x 2)     Status: None   Collection Time: 01/17/15  6:50 PM  Result Value Ref Range Status   Specimen Description BLOOD LEFT HAND  Final   Special Requests   Final    BOTTLES DRAWN AEROBIC AND ANAEROBIC 5CC AER,2CC ANA   Culture   Final    CANDIDA PARAPSILOSIS Note: Gram Stain Report Called to,Read Back By and Verified With: P PATEL RN 01/20/15 AT 1020 AM BY [facility] Performed at [facility]    Report Status 01/23/2015 FINAL  Final  Urine culture     Status: None   Collection Time: 01/17/15  8:42 PM  Result Value Ref Range Status   Specimen Description URINE, CATHETERIZED  Final   Special Requests Normal  Final   Colony Count NO GROWTH Performed at [facility]   Final   Culture NO GROWTH Performed at [facility]   Final   Report Status 01/19/2015 FINAL  Final  Culture, blood (routine x 2)     Status: None (Preliminary result)   Collection Time: 01/21/15 12:57 PM  Result Value Ref Range Status   Specimen Description BLOOD RIGHT ANTECUBITAL  Final   Special Requests BOTTLES DRAWN AEROBIC ONLY 6CC  Final   Culture   Final           BLOOD CULTURE RECEIVED NO GROWTH TO DATE CULTURE WILL BE HELD FOR 5 DAYS BEFORE  ISSUING A FINAL NEGATIVE REPORT Performed at [facility]    Report Status PENDING  Incomplete  Culture, blood (routine x 2)     Status: None (Preliminary result)   Collection Time: 01/21/15  2:07 PM  Result Value Ref Range Status   Specimen Description BLOOD RIGHT ARM  Final   Special Requests BOTTLES DRAWN AEROBIC AND ANAEROBIC 5CC  Final   Culture   Final           BLOOD CULTURE RECEIVED NO GROWTH TO DATE CULTURE WILL BE HELD FOR 5 DAYS BEFORE ISSUING A FINAL NEGATIVE REPORT Performed at [facility]    Report Status PENDING  Incomplete  Culture, blood (single)     Status: None (Preliminary result)   Collection Time: 01/22/15  9:44 PM  Result Value Ref Range Status   Specimen Description BLOOD RIGHT ARM  Final   Special Requests   Final    BOTTLES DRAWN AEROBIC AND ANAEROBIC 10CC BLUE 8CC PURPLE   Culture   Final           BLOOD CULTURE RECEIVED NO GROWTH TO DATE CULTURE WILL BE HELD FOR 5 DAYS BEFORE ISSUING A FINAL NEGATIVE REPORT Note: Culture results may be compromised due to an excessive volume of blood received in culture bottles. Performed at [facility]    Report Status PENDING  Incomplete  Culture, blood (routine x 2)     Status: None (Preliminary result)   Collection Time: 01/23/15  9:50 AM  Result Value Ref Range Status   Specimen Description BLOOD LEFT ARM  Final   Special Requests BOTTLES DRAWN AEROBIC ONLY 6CC  Final   Culture   Final           BLOOD CULTURE RECEIVED NO GROWTH TO DATE CULTURE WILL BE HELD FOR 5 DAYS BEFORE ISSUING A FINAL NEGATIVE REPORT Performed at [facility]    Report Status PENDING  Incomplete  Culture, blood (routine x 2)     Status: None (Preliminary result)   Collection Time: 01/23/15 10:00 AM  Result Value Ref Range Status   Specimen Description BLOOD LEFT HAND  Final   Special Requests BOTTLES DRAWN AEROBIC ONLY 5CC  Final   Culture   Final           BLOOD CULTURE RECEIVED NO GROWTH TO DATE CULTURE  WILL BE HELD FOR 5 DAYS BEFORE ISSUING A FINAL NEGATIVE REPORT Performed at [facility]    Report Status PENDING  Incomplete  Body fluid culture     Status: None (Preliminary result)   Collection Time: 01/25/15  6:15 AM  Result Value Ref Range Status   Specimen Description GASTRIC  Final   Special Requests Normal  Final   Gram Stain   Final    NO WBC SEEN NO ORGANISMS SEEN Performed at [facility]    Culture PENDING  Incomplete   Report Status PENDING  Incomplete     Studies:  Recent x-ray studies have been reviewed in detail by the Attending Physician  Scheduled Meds:  Scheduled Meds: . chlorhexidine  15 mL Mouth Rinse BID  . hydrocortisone sod succinate (SOLU-CORTEF) inj  25 mg Intravenous Q12H  . insulin aspart  0-20 Units Subcutaneous 6 times per day  . micafungin (MYCAMINE) IV  100 mg Intravenous QHS  . pantoprazole (PROTONIX) IV  40 mg Intravenous Q12H    Time spent on care of this patient: 40 mins   Inas Avena, J , PAC  [facility] Office  [phone] Pager - [phone]  On-Call/Text Page:      [email or web address]      password [email or web address]  If 7PM-7AM, please contact night-coverage [email or web address] Password [facility] 01/26/2015, 5:47 PM   LOS: 11 days   Care during the described time interval was provided by me .  I have reviewed this patient's available data, including medical history, events of note, physical examination, radiology studies and test results as part of my evaluation  Carolyne Littles, MD [phone] Pager

## 2015-01-27 LAB — COMPREHENSIVE METABOLIC PANEL
ALK PHOS: 154 U/L — AB (ref 39–117)
ALT: 61 U/L — AB (ref 0–53)
AST: 54 U/L — ABNORMAL HIGH (ref 0–37)
Albumin: 2.7 g/dL — ABNORMAL LOW (ref 3.5–5.2)
Anion gap: 5 (ref 5–15)
BUN: 43 mg/dL — ABNORMAL HIGH (ref 6–23)
CALCIUM: 8 mg/dL — AB (ref 8.4–10.5)
CO2: 26 mmol/L (ref 19–32)
Chloride: 105 mmol/L (ref 96–112)
Creatinine, Ser: 1.15 mg/dL (ref 0.50–1.35)
GFR calc Af Amer: >90 mL/min (ref >90)
GFR calc non Af Amer: 79 mL/min — ABNORMAL LOW (ref >90)
Glucose, Bld: 103 mg/dL — ABNORMAL HIGH (ref 70–99)
POTASSIUM: 3.1 mmol/L — AB (ref 3.5–5.1)
SODIUM: 136 mmol/L (ref 135–145)
TOTAL PROTEIN: 5.6 g/dL — AB (ref 6.0–8.3)
Total Bilirubin: 6 mg/dL — ABNORMAL HIGH (ref 0.3–1.2)

## 2015-01-27 LAB — GLUCOSE, CAPILLARY
GLUCOSE-CAPILLARY: 110 mg/dL — AB (ref 70–99)
GLUCOSE-CAPILLARY: 163 mg/dL — AB (ref 70–99)
Glucose-Capillary: 108 mg/dL — ABNORMAL HIGH (ref 70–99)
Glucose-Capillary: 108 mg/dL — ABNORMAL HIGH (ref 70–99)
Glucose-Capillary: 170 mg/dL — ABNORMAL HIGH (ref 70–99)
Glucose-Capillary: 89 mg/dL (ref 70–99)
Glucose-Capillary: 98 mg/dL (ref 70–99)

## 2015-01-27 LAB — CULTURE, BLOOD (ROUTINE X 2)
Culture: NO GROWTH
Culture: NO GROWTH

## 2015-01-27 LAB — CBC WITH DIFFERENTIAL/PLATELET
BASOS ABS: 0 10*3/uL (ref 0.0–0.1)
BASOS PCT: 0 % (ref 0–1)
Eosinophils Absolute: 0.1 10*3/uL (ref 0.0–0.7)
Eosinophils Relative: 1 % (ref 0–5)
HCT: 26.6 % — ABNORMAL LOW (ref 39.0–52.0)
Hemoglobin: 8.6 g/dL — ABNORMAL LOW (ref 13.0–17.0)
LYMPHS ABS: 0.8 10*3/uL (ref 0.7–4.0)
LYMPHS PCT: 18 % (ref 12–46)
MCH: 25.1 pg — ABNORMAL LOW (ref 26.0–34.0)
MCHC: 32.3 g/dL (ref 30.0–36.0)
MCV: 77.8 fL — ABNORMAL LOW (ref 78.0–100.0)
MONOS PCT: 12 % (ref 3–12)
Monocytes Absolute: 0.5 10*3/uL (ref 0.1–1.0)
NEUTROS ABS: 2.9 10*3/uL (ref 1.7–7.7)
NEUTROS PCT: 69 % (ref 43–77)
Platelets: 78 10*3/uL — ABNORMAL LOW (ref 150–400)
RBC: 3.42 MIL/uL — AB (ref 4.22–5.81)
RDW: 20.3 % — ABNORMAL HIGH (ref 11.5–15.5)
WBC: 4.2 10*3/uL (ref 4.0–10.5)

## 2015-01-27 LAB — PHOSPHORUS: PHOSPHORUS: 3.6 mg/dL (ref 2.3–4.6)

## 2015-01-27 LAB — MAGNESIUM: MAGNESIUM: 1.9 mg/dL (ref 1.5–2.5)

## 2015-01-27 MED ORDER — POTASSIUM CHLORIDE 10 MEQ/100ML IV SOLN
10.0000 meq | INTRAVENOUS | Status: AC
  Administered 2015-01-27 (×4): 10 meq via INTRAVENOUS
  Filled 2015-01-27 (×4): qty 100

## 2015-01-27 MED ORDER — FENTANYL CITRATE 0.05 MG/ML IJ SOLN
50.0000 ug | Freq: Once | INTRAMUSCULAR | Status: AC
  Administered 2015-01-27: 50 ug via INTRAVENOUS

## 2015-01-27 MED ORDER — MAGNESIUM SULFATE 2 GM/50ML IV SOLN
2.0000 g | Freq: Once | INTRAVENOUS | Status: AC
  Administered 2015-01-27: 2 g via INTRAVENOUS
  Filled 2015-01-27: qty 50

## 2015-01-27 MED ORDER — FENTANYL 25 MCG/HR TD PT72
50.0000 ug | MEDICATED_PATCH | TRANSDERMAL | Status: DC
  Filled 2015-01-27: qty 2

## 2015-01-27 MED ORDER — SODIUM CHLORIDE 0.9 % IV SOLN
INTRAVENOUS | Status: DC
  Administered 2015-01-28: 500 mL via INTRAVENOUS

## 2015-01-27 MED ORDER — FENTANYL CITRATE 0.05 MG/ML IJ SOLN
INTRAMUSCULAR | Status: AC
  Administered 2015-01-27: 50 ug via INTRAVENOUS
  Filled 2015-01-27: qty 2

## 2015-01-27 MED ORDER — FENTANYL CITRATE 0.05 MG/ML IJ SOLN
100.0000 ug | INTRAMUSCULAR | Status: DC | PRN
  Administered 2015-01-27 (×2): 150 ug via INTRAVENOUS
  Administered 2015-01-28 (×3): 100 ug via INTRAVENOUS
  Administered 2015-01-28: 150 ug via INTRAVENOUS
  Administered 2015-01-28: 100 ug via INTRAVENOUS
  Administered 2015-01-29 (×2): 150 ug via INTRAVENOUS
  Administered 2015-01-29: 100 ug via INTRAVENOUS
  Administered 2015-01-29 – 2015-01-30 (×6): 150 ug via INTRAVENOUS
  Administered 2015-01-30 (×2): 100 ug via INTRAVENOUS
  Administered 2015-01-30 (×4): 150 ug via INTRAVENOUS
  Administered 2015-01-30 – 2015-01-31 (×3): 100 ug via INTRAVENOUS
  Administered 2015-01-31: 150 ug via INTRAVENOUS
  Administered 2015-01-31: 100 ug via INTRAVENOUS
  Filled 2015-01-27 (×2): qty 4
  Filled 2015-01-27: qty 2
  Filled 2015-01-27 (×5): qty 4
  Filled 2015-01-27: qty 2
  Filled 2015-01-27: qty 4
  Filled 2015-01-27: qty 2
  Filled 2015-01-27: qty 4
  Filled 2015-01-27: qty 2
  Filled 2015-01-27: qty 4
  Filled 2015-01-27 (×4): qty 2
  Filled 2015-01-27: qty 4
  Filled 2015-01-27 (×2): qty 2
  Filled 2015-01-27: qty 4
  Filled 2015-01-27 (×2): qty 2
  Filled 2015-01-27 (×2): qty 4
  Filled 2015-01-27: qty 2
  Filled 2015-01-27 (×2): qty 4
  Filled 2015-01-27: qty 2

## 2015-01-27 NOTE — Progress Notes (Signed)
David [name] TEAM 1 - Stepdown/ICU TEAM Progress Note  David [name] WKG:[id number] DOB: [birth year] DOA: 01/15/2015 PCP: [name], MD  Admit HPI / Brief Narrative: 39 yo WM PMHx Crohn's disease and also has history of previous peptic ulcer disease. He is awaiting small bowel transplantation at [facility]. He also has had gastrojejunostomy in 2004 gated by abdominal wound and resulting Bovine Mesh (per patient), chronic gastroparesis. He also has cirrhosis of the liver most likely secondary to David [name]. He recently was admitted for aspiration pneumonia. He now gives a 2 day history of dyspnea with coughing today. He is also had fevers. His oxygen saturation is very low and is now requiring a nonrebreather mask to maintain saturations above 90%. Chest x-ray suggestive of pneumonia, probably aspiration. He is now going to be admitted for further management.  HPI/Subjective: 2/14 A/O 4, some increasing abdominal pain around PEG site not controlled with current pain regimen.   Assessment/Plan: Severe sepsis/Persistent fungemia/Suspected HCAP -Continue below antibiotics -blood cultures on 2/13 pending -PEG culture 2/14 pending -Spoke with GI and infectious disease both felt that although PEG may be a nidus for infection unlikely, therefore leave in place. -Cardiology to perform TEE Monday  Hypoxic respiratory failure -See severe sepsis  Acute renal failure (Cr baseline 0.70 in 11/15) - Improving, continue normal saline at 98ml/hr  Crohn's disease of small and large bowel.  -Per patient awaiting small bowel and liver transplant at [facility]  Chronic gastroparesis -Patient has had a PEG tube a minimum of 2 months; GI believes that it could be a nidus for infection, however low probability should leave in. ID agrees -PEG tube fluid for culture and stain pending  Protein calorie malnutrition, severe. -TPN stopped secondary to sepsis -Patient on clear liquid diet, and tolerating well    Hypokalemia -Potassium IV 10 mEq 4 runs    Code Status: FULL Family Communication: Wife and Parents present at time of exam Disposition Plan: Resolution sepsis; transferred to [facility] for transplant?    Consultants:  Dr.Cornelius N Daiva Eves (infectious disease) Dr.Ryan B Sanford (nephrology) Dr.Daniel Daneil Dan [facility] M)   Procedure/Significant Events: 2/03 - intubated, transferred to [facility] ICU. Started on ARDS protocol 2/03 Renal consult: CRRT initiated for mgmt of severe acidosis and hyperkalemia 2/03 Gen surg consult: CT abdomen ordered. Noted that pt was difficult surgery @ [facility] 2/04 CT abdomen and pelvis: Diffuse abnormal appearance of the bowel with multifocal bowel wall thick in. There is probable pneumatosis involving small bowel in the lower abdomen. No definite perforation. There is increased mesenteric edema and free fluid from prior exam. Decreased hydronephrosis. Splenomegaly. Worsening airspace disease in the lower lungs, left greater than Right 2/04 ID consult for fungemia: Micafungin added 2/07 Ventilator dyssynchrony with appearance of proximal airway obstruction. Improved with suctioning. Off vasopressors. Agitation difficult to control.  2/08 Passed SBT. Remains agitated. Extubated and tolerating. Weaning vasopressors to off. Persistent fungemia on F/U BCs. ID service recommends echocardiogram and change of indwelling catheters/lines. Uo picking up with furosemide. CRRT continued.  2/09 TTE: The estimated ejection fraction was 65%. Wall motion was normal; there were no regional wall motion abnormalities. Slight dilitation of aortic root. No obvious vegetations seen 2/09 Transfer to SDU ordered   Culture MRSA PCR 2/02 >> NEG Urine 2/04 >> NEG Resp 2/03 >> NEG Blood 2/02 >> 2/2 yeast Blood 2/03 >> 2/2 candida glabrata Blood 2/04 >> 1/2 yeast Resp 2/04 >> NOF Blood 2/08 >>  PEG 2/14 pending   Antibiotics: Clinda 2/02 >>  stopped 2/02 Cefepime 2/02 >>  stopped 2/02 Vanc 2/02 >> stopped 2/09 Pip-tazo 2/02 >> stopped 2/09 Micafungin 2/03 >>   DVT prophylaxis:    Devices PEG tube placed~December 2015   LINES / TUBES:  ETT 2/03 >> stopped 2/08 R femoral A-line 2/03 >> stopped 2/08 R IJ HD cath 2/04 >> stopped 2/08 L IJ CVL 2/04 >> stopped 2/09    Continuous Infusions: . sodium chloride 75 mL/hr at 01/27/15 1045  . sodium chloride      Objective: VITAL SIGNS: Temp: 98.1 F (36.7 C) (02/14 1215) Temp Source: Oral (02/14 1215) BP: 110/64 mmHg (02/14 1625) Pulse Rate: 75 (02/14 1625) SPO2; FIO2:   Intake/Output Summary (Last 24 hours) at 01/27/15 1854 Last data filed at 01/27/15 1800  Gross per 24 hour  Intake   2100 ml  Output   3050 ml  Net   -950 ml     Exam: General: A/O 4, NAD, No acute respiratory distress Lungs: Clear to auscultation bilaterally without wheezes or crackles Cardiovascular: Regular rate and rhythm without murmur gallop or rub normal S1 and S2 Abdomen: Increased tender RUQ, distended, soft, bowel sounds positive, no rebound, no ascites, no appreciable mass Extremities: No significant cyanosis, clubbing, or edema bilateral lower extremities  Data Reviewed: Basic Metabolic Panel:  Recent Labs Lab 01/21/15 1640 01/22/15 0420 01/23/15 0237 01/24/15 0318 01/25/15 0326 01/26/15 0310 01/26/15 1911 01/27/15 0250  NA 134* 136 140 138  --  134*  --  136  K 3.9 3.0* 3.4* 3.5  --  2.9* 3.6 3.1*  CL 101 101 103 102  --  99  --  105  CO2 28 27 23 27   --  24  --  26  GLUCOSE 183* 284* 145* 146*  --  141*  --  103*  BUN 40* 63* 87* 84*  --  54*  --  43*  CREATININE 1.00 1.31 1.32 1.36*  --  1.00  --  1.15  CALCIUM 7.4* 7.7* 7.4* 7.2*  --  7.4*  --  8.0*  MG  --  2.0 2.1 2.4 2.3 2.1 2.2 1.9  PHOS 2.9 4.8* 6.5*  --   --  4.2  --  3.6   Liver Function Tests:  Recent Labs Lab 01/21/15 0349 01/21/15 1640 01/22/15 0420 01/23/15 0237 01/26/15 0310 01/27/15 0250  AST 60*  --   --   --  47*  54*  ALT 75*  --   --   --  56* 61*  ALKPHOS 178*  --   --   --  148* 154*  BILITOT 5.8*  --   --   --  5.8* 6.0*  PROT 5.3*  --   --   --  6.3 5.6*  ALBUMIN 2.3* 2.3* 2.1* 2.4* 2.4* 2.7*   No results for input(s): LIPASE, AMYLASE in the last 168 hours. No results for input(s): AMMONIA in the last 168 hours. CBC:  Recent Labs Lab 01/21/15 0349  01/23/15 0237 01/24/15 0318 01/25/15 0326 01/26/15 0310 01/27/15 0250  WBC 8.9  < > 8.2 9.7 4.4 4.0 4.2  NEUTROABS 7.0  --   --   --   --  3.1 2.9  HGB 8.4*  < > 8.5* 9.2* 8.1* 8.4* 8.6*  HCT 26.3*  < > 25.6* 27.6* 25.2* 25.5* 26.6*  MCV 79.9  < > 75.7* 76.0* 76.8* 76.8* 77.8*  PLT 26*  < > 53* 99* 59* 70* 78*  < > = values in this  interval not displayed. Cardiac Enzymes: No results for input(s): CKTOTAL, CKMB, CKMBINDEX, TROPONINI in the last 168 hours. BNP (last 3 results) No results for input(s): BNP in the last 8760 hours.  ProBNP (last 3 results) No results for input(s): PROBNP in the last 8760 hours.  CBG:  Recent Labs Lab 01/27/15 0030 01/27/15 0349 01/27/15 0729 01/27/15 1216 01/27/15 1624  GLUCAP 89 110* 108* 98 108*    Recent Results (from the past 240 hour(s))  Urine culture     Status: None   Collection Time: 01/17/15  8:42 PM  Result Value Ref Range Status   Specimen Description URINE, CATHETERIZED  Final   Special Requests Normal  Final   Colony Count NO GROWTH Performed at [facility]   Final   Culture NO GROWTH Performed at [facility]   Final   Report Status 01/19/2015 FINAL  Final  Culture, blood (routine x 2)     Status: None (Preliminary result)   Collection Time: 01/21/15 12:57 PM  Result Value Ref Range Status   Specimen Description BLOOD RIGHT ANTECUBITAL  Final   Special Requests BOTTLES DRAWN AEROBIC ONLY 6CC  Final   Culture   Final           BLOOD CULTURE RECEIVED NO GROWTH TO DATE CULTURE WILL BE HELD FOR 5 DAYS BEFORE ISSUING A FINAL NEGATIVE REPORT Performed at  [facility]    Report Status PENDING  Incomplete  Culture, blood (routine x 2)     Status: None (Preliminary result)   Collection Time: 01/21/15  2:07 PM  Result Value Ref Range Status   Specimen Description BLOOD RIGHT ARM  Final   Special Requests BOTTLES DRAWN AEROBIC AND ANAEROBIC 5CC  Final   Culture   Final           BLOOD CULTURE RECEIVED NO GROWTH TO DATE CULTURE WILL BE HELD FOR 5 DAYS BEFORE ISSUING A FINAL NEGATIVE REPORT Performed at [facility]    Report Status PENDING  Incomplete  Culture, blood (single)     Status: None (Preliminary result)   Collection Time: 01/22/15  9:44 PM  Result Value Ref Range Status   Specimen Description BLOOD RIGHT ARM  Final   Special Requests   Final    BOTTLES DRAWN AEROBIC AND ANAEROBIC 10CC BLUE 8CC PURPLE   Culture   Final           BLOOD CULTURE RECEIVED NO GROWTH TO DATE CULTURE WILL BE HELD FOR 5 DAYS BEFORE ISSUING A FINAL NEGATIVE REPORT Note: Culture results may be compromised due to an excessive volume of blood received in culture bottles. Performed at [facility]    Report Status PENDING  Incomplete  Culture, blood (routine x 2)     Status: None (Preliminary result)   Collection Time: 01/23/15  9:50 AM  Result Value Ref Range Status   Specimen Description BLOOD LEFT ARM  Final   Special Requests BOTTLES DRAWN AEROBIC ONLY 6CC  Final   Culture   Final           BLOOD CULTURE RECEIVED NO GROWTH TO DATE CULTURE WILL BE HELD FOR 5 DAYS BEFORE ISSUING A FINAL NEGATIVE REPORT Performed at [facility]    Report Status PENDING  Incomplete  Culture, blood (routine x 2)     Status: None (Preliminary result)   Collection Time: 01/23/15 10:00 AM  Result Value Ref Range Status   Specimen Description BLOOD LEFT HAND  Final   Special Requests BOTTLES DRAWN AEROBIC ONLY 5CC  Final   Culture   Final           BLOOD CULTURE RECEIVED NO GROWTH TO DATE CULTURE WILL BE HELD FOR 5 DAYS BEFORE ISSUING A  FINAL NEGATIVE REPORT Performed at [facility]    Report Status PENDING  Incomplete  Body fluid culture     Status: None (Preliminary result)   Collection Time: 01/25/15  6:15 AM  Result Value Ref Range Status   Specimen Description GASTRIC  Final   Special Requests Normal  Final   Gram Stain   Final    NO WBC SEEN NO ORGANISMS SEEN Performed at [facility]    Culture PENDING  Incomplete   Report Status PENDING  Incomplete     Studies:  Recent x-ray studies have been reviewed in detail by the Attending Physician  Scheduled Meds:  Scheduled Meds: . chlorhexidine  15 mL Mouth Rinse BID  . hydrocortisone sod succinate (SOLU-CORTEF) inj  25 mg Intravenous Q12H  . insulin aspart  0-20 Units Subcutaneous 6 times per day  . micafungin (MYCAMINE) IV  100 mg Intravenous QHS  . pantoprazole (PROTONIX) IV  40 mg Intravenous Q12H    Time spent on care of this patient: 40 mins   Drema Dallas [facility]  [facility] Office  [phone] Pager - [phone]  On-Call/Text Page:      [email or web address]      password [email or web address]  If 7PM-7AM, please contact night-coverage [email or web address] Password [facility] 01/27/2015, 6:54 PM   LOS: 12 days   Care during the described time interval was provided by me .  I have reviewed this patient's available data, including medical history, events of note, physical examination, radiology studies and test results as part of my evaluation  Carolyne Littles, MD [phone] Pager

## 2015-01-28 ENCOUNTER — Encounter (HOSPITAL_COMMUNITY)
Admission: EM | Disposition: A | Discharge status: Home Health | Payer: Self-pay | Source: Home / Self Care | Attending: Emergency Medicine

## 2015-01-28 DIAGNOSIS — I330 Acute and subacute infective endocarditis: Secondary | ICD-10-CM

## 2015-01-28 DIAGNOSIS — I288 Other diseases of pulmonary vessels: Secondary | ICD-10-CM

## 2015-01-28 HISTORY — PX: TEE WITHOUT CARDIOVERSION: SHX5443

## 2015-01-28 LAB — PREALBUMIN: PREALBUMIN: 18.5 mg/dL (ref 17.0–34.0)

## 2015-01-28 LAB — COMPREHENSIVE METABOLIC PANEL
ALT: 62 U/L — ABNORMAL HIGH (ref 0–53)
ANION GAP: 5 (ref 5–15)
AST: 46 U/L — AB (ref 0–37)
Albumin: 2.4 g/dL — ABNORMAL LOW (ref 3.5–5.2)
Alkaline Phosphatase: 144 U/L — ABNORMAL HIGH (ref 39–117)
BILIRUBIN TOTAL: 4.3 mg/dL — AB (ref 0.3–1.2)
BUN: 41 mg/dL — ABNORMAL HIGH (ref 6–23)
CO2: 22 mmol/L (ref 19–32)
CREATININE: 1.06 mg/dL (ref 0.50–1.35)
Calcium: 7.6 mg/dL — ABNORMAL LOW (ref 8.4–10.5)
Chloride: 108 mmol/L (ref 96–112)
GFR calc Af Amer: >90 mL/min (ref >90)
GFR, EST NON AFRICAN AMERICAN: 87 mL/min — AB (ref >90)
GLUCOSE: 85 mg/dL (ref 70–99)
POTASSIUM: 3.3 mmol/L — AB (ref 3.5–5.1)
Sodium: 135 mmol/L (ref 135–145)
Total Protein: 5.4 g/dL — ABNORMAL LOW (ref 6.0–8.3)

## 2015-01-28 LAB — GLUCOSE, CAPILLARY
GLUCOSE-CAPILLARY: 96 mg/dL (ref 70–99)
Glucose-Capillary: 98 mg/dL (ref 70–99)

## 2015-01-28 LAB — CBC WITH DIFFERENTIAL/PLATELET
BASOS PCT: 0 % (ref 0–1)
Basophils Absolute: 0 10*3/uL (ref 0.0–0.1)
EOS PCT: 1 % (ref 0–5)
Eosinophils Absolute: 0.1 10*3/uL (ref 0.0–0.7)
HEMATOCRIT: 25.8 % — AB (ref 39.0–52.0)
HEMOGLOBIN: 8.5 g/dL — AB (ref 13.0–17.0)
Lymphocytes Relative: 12 % (ref 12–46)
Lymphs Abs: 1 10*3/uL (ref 0.7–4.0)
MCH: 25.8 pg — AB (ref 26.0–34.0)
MCHC: 32.9 g/dL (ref 30.0–36.0)
MCV: 78.4 fL (ref 78.0–100.0)
MONO ABS: 0.8 10*3/uL (ref 0.1–1.0)
Monocytes Relative: 10 % (ref 3–12)
Neutro Abs: 6.3 10*3/uL (ref 1.7–7.7)
Neutrophils Relative %: 77 % (ref 43–77)
PLATELETS: 129 10*3/uL — AB (ref 150–400)
RBC: 3.29 MIL/uL — AB (ref 4.22–5.81)
RDW: 19.9 % — ABNORMAL HIGH (ref 11.5–15.5)
WBC: 8.2 10*3/uL (ref 4.0–10.5)

## 2015-01-28 LAB — BODY FLUID CULTURE
Gram Stain: NONE SEEN
Special Requests: NORMAL

## 2015-01-28 LAB — MAGNESIUM: Magnesium: 2.1 mg/dL (ref 1.5–2.5)

## 2015-01-28 LAB — PHOSPHORUS: PHOSPHORUS: 3.5 mg/dL (ref 2.3–4.6)

## 2015-01-28 SURGERY — ECHOCARDIOGRAM, TRANSESOPHAGEAL
Anesthesia: Moderate Sedation

## 2015-01-28 MED ORDER — MIDAZOLAM HCL 5 MG/ML IJ SOLN
INTRAMUSCULAR | Status: AC
  Filled 2015-01-28: qty 2

## 2015-01-28 MED ORDER — POTASSIUM CHLORIDE 10 MEQ/100ML IV SOLN
10.0000 meq | INTRAVENOUS | Status: AC
  Administered 2015-01-28 (×3): 10 meq via INTRAVENOUS
  Filled 2015-01-28 (×2): qty 100

## 2015-01-28 MED ORDER — PANTOPRAZOLE SODIUM 40 MG IV SOLR
40.0000 mg | Freq: Every day | INTRAVENOUS | Status: DC
  Administered 2015-01-29 – 2015-01-30 (×2): 40 mg via INTRAVENOUS
  Filled 2015-01-28 (×2): qty 40

## 2015-01-28 MED ORDER — HYDROCORTISONE NA SUCCINATE PF 100 MG IJ SOLR
25.0000 mg | Freq: Every day | INTRAMUSCULAR | Status: DC
  Administered 2015-01-29: 25 mg via INTRAVENOUS
  Filled 2015-01-28: qty 2

## 2015-01-28 MED ORDER — KCL IN DEXTROSE-NACL 20-5-0.9 MEQ/L-%-% IV SOLN
INTRAVENOUS | Status: AC
  Administered 2015-01-28 – 2015-01-30 (×2): via INTRAVENOUS
  Filled 2015-01-28 (×6): qty 1000

## 2015-01-28 MED ORDER — ENOXAPARIN SODIUM 40 MG/0.4ML ~~LOC~~ SOLN
40.0000 mg | SUBCUTANEOUS | Status: DC
  Administered 2015-01-28 – 2015-01-31 (×4): 40 mg via SUBCUTANEOUS
  Filled 2015-01-28 (×4): qty 0.4

## 2015-01-28 MED ORDER — FENTANYL CITRATE 0.05 MG/ML IJ SOLN
INTRAMUSCULAR | Status: DC | PRN
Start: 2015-01-28 — End: 2015-01-28
  Administered 2015-01-28 (×4): 25 ug via INTRAVENOUS

## 2015-01-28 MED ORDER — MIDAZOLAM HCL 10 MG/2ML IJ SOLN
INTRAMUSCULAR | Status: DC | PRN
  Administered 2015-01-28 (×5): 2 mg via INTRAVENOUS

## 2015-01-28 MED ORDER — FENTANYL CITRATE 0.05 MG/ML IJ SOLN
INTRAMUSCULAR | Status: AC
  Filled 2015-01-28: qty 2

## 2015-01-28 MED ORDER — BUTAMBEN-TETRACAINE-BENZOCAINE 2-2-14 % EX AERO
INHALATION_SPRAY | CUTANEOUS | Status: DC | PRN
  Administered 2015-01-28: 2 via TOPICAL

## 2015-01-28 NOTE — Op Note (Signed)
INDICATIONS: infective endocarditis  PROCEDURE:   Informed consent was obtained prior to the procedure. The risks, benefits and alternatives for the procedure were discussed and the patient comprehended these risks.  Risks include, but are not limited to, cough, sore throat, vomiting, nausea, somnolence, esophageal and stomach trauma or perforation, bleeding, low blood pressure, aspiration, pneumonia, infection, trauma to the teeth and death.    After a procedural time-out, the oropharynx was anesthetized with 20% benzocaine spray. The patient was given 10 mg versed and 100 mcg fentanyl for moderate sedation.   The transesophageal probe was inserted in the esophagus and stomach without difficulty and multiple views were obtained.  The patient was kept under observation until the patient left the procedure room.  The patient left the procedure room in stable condition.   Agitated microbubble saline contrast was not administered.  COMPLICATIONS:    There were no immediate complications.  FINDINGS:  Normal TEE No vegetations seen   Time Spent Directly with the Patient:  60 minutes   David [name] 01/28/2015, 8:51 AM

## 2015-01-28 NOTE — Progress Notes (Signed)
PT Cancellation Note  Patient Details Name: MIHAILO ALBRECHT MRN: [id number] DOB: June 01, 1976   Cancelled Treatment:    Reason Eval/Treat Not Completed: Fatigue/lethargy limiting ability to participate.  Pt indicates still feeling "groggy and out of it" since TEE this am.  Will hold PT at this time and f/u tomorrow.     Cherly Erno, Alison Murray 01/28/2015, 2:11 PM

## 2015-01-28 NOTE — Progress Notes (Signed)
    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 I WAS ONLY WORRIEDA BOUT THE INTRA-VASCULAR ONES  WOULD NOT TREAT A GASTRIC TUBE ASPIRATE THIS IS A COLONIZER  I WILL SIGN OFF PLEASE CALL WITH FURTHER QUESTIONS.

## 2015-01-28 NOTE — Progress Notes (Signed)
  Echocardiogram Echocardiogram Transesophageal has been performed.  David [name] 01/28/2015, 9:36 AM

## 2015-01-28 NOTE — Progress Notes (Signed)
Pt taken to endo area on monitor for TEE.

## 2015-01-28 NOTE — Progress Notes (Signed)
Report called to [place] on [address].

## 2015-01-28 NOTE — Interval H&P Note (Signed)
History and Physical Interval Note:  01/28/2015 8:48 AM  David [name]  has presented today for surgery, with the diagnosis of POSITIVE BLOOD CULTURES  The various methods of treatment have been discussed with the patient and family. After consideration of risks, benefits and other options for treatment, the patient has consented to  Procedure(s): TRANSESOPHAGEAL ECHOCARDIOGRAM (TEE) (N/A) as a surgical intervention .  The patient's history has been reviewed, patient examined, no change in status, stable for surgery.  I have reviewed the patient's chart and labs.  Questions were answered to the patient's satisfaction.    Increased risk of serious complications due to cirrhosis with esophageal varices and numerous comorbid conditions. Will obtain midesophageal views and avoid lower esophagus and stomach views as long as pertinent information can be obtained.  [name]

## 2015-01-28 NOTE — H&P (View-Only) (Signed)
  [facility]       Subjective:  Feels better today   Antibiotics:  Anti-infectives    Start     Dose/Rate Route Frequency Ordered Stop   01/22/15 0200  piperacillin-tazobactam (ZOSYN) IVPB 3.375 g  Status:  Discontinued     3.375 g 12.5 mL/hr over 240 Minutes Intravenous 3 times per day 01/22/15 0013 01/22/15 1036   01/20/15 1800  vancomycin (VANCOCIN) IVPB 750 mg/150 ml premix  Status:  Discontinued     750 mg 150 mL/hr over 60 Minutes Intravenous Every 12 hours 01/20/15 1753 01/22/15 1036   01/17/15 1800  vancomycin (VANCOCIN) IVPB 1000 mg/200 mL premix  Status:  Discontinued     1,000 mg 200 mL/hr over 60 Minutes Intravenous Every 24 hours 01/17/15 0140 01/20/15 1753   01/17/15 0600  piperacillin-tazobactam (ZOSYN) IVPB 3.375 g  Status:  Discontinued     3.375 g 100 mL/hr over 30 Minutes Intravenous 4 times per day 01/17/15 0140 01/22/15 0013   01/16/15 2300  micafungin (MYCAMINE) 100 mg in sodium chloride 0.9 % 100 mL IVPB     100 mg 100 mL/hr over 1 Hours Intravenous Daily at bedtime 01/16/15 2140     01/16/15 2200  piperacillin-tazobactam (ZOSYN) IVPB 3.375 g  Status:  Discontinued     3.375 g 12.5 mL/hr over 240 Minutes Intravenous 3 times per day 01/16/15 1733 01/17/15 0133   01/16/15 1800  vancomycin (VANCOCIN) IVPB 1000 mg/200 mL premix  Status:  Discontinued     1,000 mg 200 mL/hr over 60 Minutes Intravenous Every 8 hours 01/16/15 1733 01/17/15 0134   01/16/15 0400  vancomycin (VANCOCIN) IVPB 1000 mg/200 mL premix  Status:  Discontinued     1,000 mg 200 mL/hr over 60 Minutes Intravenous Every 12 hours 01/15/15 1836 01/16/15 1710   01/15/15 2200  piperacillin-tazobactam (ZOSYN) IVPB 3.375 g  Status:  Discontinued     3.375 g 12.5 mL/hr over 240 Minutes Intravenous 3 times per day 01/15/15 1853 01/16/15 1710   01/15/15 1600  vancomycin (VANCOCIN) IVPB 1000 mg/200 mL premix     1,000 mg 200 mL/hr over 60 Minutes Intravenous  Once  01/15/15 1557 01/15/15 1754   01/15/15 1500  ceFEPIme (MAXIPIME) 2 g in dextrose 5 % 50 mL IVPB     2 g 100 mL/hr over 30 Minutes Intravenous  Once 01/15/15 1458 01/15/15 1540   01/15/15 1500  clindamycin (CLEOCIN) IVPB 600 mg     600 mg 100 mL/hr over 30 Minutes Intravenous  Once 01/15/15 1458 01/15/15 1537      Medications: Scheduled Meds: . chlorhexidine  15 mL Mouth Rinse BID  . hydrocortisone sod succinate (SOLU-CORTEF) inj  25 mg Intravenous Q12H  . insulin aspart  0-20 Units Subcutaneous 6 times per day  . micafungin (MYCAMINE) IV  100 mg Intravenous QHS  . pantoprazole (PROTONIX) IV  40 mg Intravenous Q12H   Continuous Infusions: . sodium chloride 75 mL/hr at 01/24/15 1036   PRN Meds:.sodium chloride, albuterol, fentaNYL    Objective: Weight change: 3 lb 1.4 oz (1.4 kg)  Intake/Output Summary (Last 24 hours) at 01/25/15 2005 Last data filed at 01/25/15 1938  Gross per 24 hour  Intake   1800 ml  Output   4200 ml  Net  -2400 ml   Blood pressure 115/66, pulse 81, temperature 97.7 F (36.5 C), temperature source Oral, resp. rate 15, height 5' 11" (1.803 m), weight 158 lb 1.1 oz (71.7 kg), SpO2   100 %. Temp:  [97.7 F (36.5 C)-98.6 F (37 C)] 97.7 F (36.5 C) (02/12 1938) Pulse Rate:  [70-83] 81 (02/12 1938) Resp:  [13-21] 15 (02/12 1938) BP: (103-115)/(56-68) 115/66 mmHg (02/12 1938) SpO2:  [96 %-100 %] 100 % (02/12 1938) Weight:  [158 lb 1.1 oz (71.7 kg)] 158 lb 1.1 oz (71.7 kg) (02/12 0456)  Physical Exam: General: Alert oriented conversant oriented x 3 HEENT: Normocephalic,  CVS regular rate, normal r, no murmur rubs or gallops Chest:rhonchi Abdomen: soft, bandage in place.  Extremities: 2+ edema Neuro: nonfocal   CBC:   CBC Latest Ref Rng 01/25/2015 01/24/2015 01/23/2015  WBC 4.0 - 10.5 K/uL 4.4 9.7 8.2  Hemoglobin 13.0 - 17.0 g/dL 8.1(L) 9.2(L) 8.5(L)  Hematocrit 39.0 - 52.0 % 25.2(L) 27.6(L) 25.6(L)  Platelets 150 - 400 K/uL 59(L) 99(L) 53(L)        BMET  Recent Labs  01/23/15 0237 01/24/15 0318  NA 140 138  K 3.4* 3.5  CL 103 102  CO2 23 27  GLUCOSE 145* 146*  BUN 87* 84*  CREATININE 1.32 1.36*  CALCIUM 7.4* 7.2*     Liver Panel   Recent Labs  01/23/15 0237  ALBUMIN 2.4*       Sedimentation Rate No results for input(s): ESRSEDRATE in the last 72 hours. C-Reactive Protein No results for input(s): CRP in the last 72 hours.  Micro Results: Recent Results (from the past 720 hour(s))  Clostridium Difficile by PCR     Status: None   Collection Time: 12/26/14 10:16 PM  Result Value Ref Range Status   C difficile by pcr NEGATIVE NEGATIVE Final  Wound culture     Status: None   Collection Time: 12/31/14 12:54 AM  Result Value Ref Range Status   Specimen Description WOUND ABDOMEN  Final   Special Requests IMMUNE:COMPROMISED  Final   Gram Stain   Final    RARE WBC PRESENT, PREDOMINANTLY MONONUCLEAR NO SQUAMOUS EPITHELIAL CELLS SEEN RARE GRAM POSITIVE COCCI IN PAIRS IN CLUSTERS FEW YEAST Performed at [facility]    Culture   Final    MULTIPLE ORGANISMS PRESENT, NONE PREDOMINANT Note: NO GROUP A STREP (S.PYOGENES) ISOLATED NO STAPHYLOCOCCUS AUREUS ISOLATED Performed at [facility]    Report Status 01/03/2015 FINAL  Final  Blood culture (routine x 2)     Status: None   Collection Time: 01/15/15  3:07 PM  Result Value Ref Range Status   Specimen Description BLOOD LEFT ANTECUBITAL  Final   Special Requests BOTTLES DRAWN AEROBIC AND ANAEROBIC 6CC  Final   Culture   Final    CANDIDA GLABRATA CANDIDA PARAPSILOSIS Note: Gram Stain Report Called to,Read Back By and Verified With: MEREDITH Dominik 1803 01/16/15 BY BAUGHAM M Performed at [facility] Performed at [facility]    Report Status 01/22/2015 FINAL  Final  Blood culture (routine x 2)     Status: None   Collection Time: 01/15/15  3:12 PM  Result Value Ref Range Status   Specimen Description BLOOD LEFT ARM   Final   Special Requests BOTTLES DRAWN AEROBIC AND ANAEROBIC 8CC  Final   Culture   Final    CANDIDA GLABRATA CANDIDA PARAPSILOSIS Note: Gram Stain Report Called to,Read Back By and Verified With: MEREDITH Defibaugh 1803 01/16/15 BY BAUGHAM M Performed at [facility] Performed at [facility]    Report Status 01/22/2015 FINAL  Final  MRSA PCR Screening     Status: None     Collection Time: 01/15/15  6:33 PM  Result Value Ref Range Status   MRSA by PCR NEGATIVE NEGATIVE Final    Comment:        The GeneXpert MRSA Assay (FDA approved for NASAL specimens only), is one component of a comprehensive MRSA colonization surveillance program. It is not intended to diagnose MRSA infection nor to guide or monitor treatment for MRSA infections.   Culture, respiratory (tracheal aspirate)     Status: None   Collection Time: 01/16/15  5:46 PM  Result Value Ref Range Status   Specimen Description TRACHEAL ASPIRATE  Final   Special Requests Normal  Final   Gram Stain   Final    FEW WBC PRESENT, PREDOMINANTLY PMN NO SQUAMOUS EPITHELIAL CELLS SEEN NO ORGANISMS SEEN Performed at [facility]    Culture   Final    NO GROWTH 2 DAYS Performed at [facility]    Report Status 01/19/2015 FINAL  Final  Culture, blood (routine x 2)     Status: None   Collection Time: 01/16/15  6:30 PM  Result Value Ref Range Status   Specimen Description BLOOD LEFT ARM  Final   Special Requests BOTTLES DRAWN AEROBIC AND ANAEROBIC 5CC  Final   Culture   Final    CANDIDA PARAPSILOSIS Note: Gram Stain Report Called to,Read Back By and Verified With: PHYLLIS MCKINNEY ON 2.5.2016 AT 7:30P BY WILEJ Performed at [facility]    Report Status 01/21/2015 FINAL  Final  Culture, blood (routine x 2)     Status: None   Collection Time: 01/16/15  6:45 PM  Result Value Ref Range Status   Specimen Description BLOOD LEFT HAND  Final   Special Requests BOTTLES DRAWN AEROBIC AND ANAEROBIC 5CC   Final   Culture   Final    CANDIDA GLABRATA Note: Gram Stain Report Called to,Read Back By and Verified With: CHRISTINE MATHERLY 01/19/15 0455A FULKC Performed at [facility]    Report Status 01/21/2015 FINAL  Final  Culture, respiratory (NON-Expectorated)     Status: None   Collection Time: 01/17/15  8:40 AM  Result Value Ref Range Status   Specimen Description TRACHEAL SITE  Final   Special Requests NONE  Final   Gram Stain   Final    FEW WBC PRESENT,BOTH PMN AND MONONUCLEAR RARE SQUAMOUS EPITHELIAL CELLS PRESENT NO ORGANISMS SEEN Performed at [facility]    Culture   Final    Non-Pathogenic Oropharyngeal-type Flora Isolated. Performed at [facility]    Report Status 01/20/2015 FINAL  Final  Culture, blood (routine x 2)     Status: None   Collection Time: 01/17/15  1:13 PM  Result Value Ref Range Status   Specimen Description BLOOD LEFT HAND  Final   Special Requests BOTTLES DRAWN AEROBIC ONLY 7.5CC  Final   Culture   Final    NO GROWTH 5 DAYS Performed at [facility]    Report Status 01/23/2015 FINAL  Final  Culture, blood (routine x 2)     Status: None   Collection Time: 01/17/15  6:50 PM  Result Value Ref Range Status   Specimen Description BLOOD LEFT HAND  Final   Special Requests   Final    BOTTLES DRAWN AEROBIC AND ANAEROBIC 5CC AER,2CC ANA   Culture   Final    CANDIDA PARAPSILOSIS Note: Gram Stain Report Called to,Read Back By and Verified With: P PATEL RN 01/20/15 AT 1020 AM BY MORAC Performed at   [facility]    Report Status 01/23/2015 FINAL  Final  Urine culture     Status: None   Collection Time: 01/17/15  8:42 PM  Result Value Ref Range Status   Specimen Description URINE, CATHETERIZED  Final   Special Requests Normal  Final   Colony Count NO GROWTH Performed at [facility]   Final   Culture NO GROWTH Performed at [facility]   Final   Report Status 01/19/2015 FINAL  Final  Culture,  blood (routine x 2)     Status: None (Preliminary result)   Collection Time: 01/21/15 12:57 PM  Result Value Ref Range Status   Specimen Description BLOOD RIGHT ANTECUBITAL  Final   Special Requests BOTTLES DRAWN AEROBIC ONLY 6CC  Final   Culture   Final           BLOOD CULTURE RECEIVED NO GROWTH TO DATE CULTURE WILL BE HELD FOR 5 DAYS BEFORE ISSUING A FINAL NEGATIVE REPORT Performed at [facility]    Report Status PENDING  Incomplete  Culture, blood (routine x 2)     Status: None (Preliminary result)   Collection Time: 01/21/15  2:07 PM  Result Value Ref Range Status   Specimen Description BLOOD RIGHT ARM  Final   Special Requests BOTTLES DRAWN AEROBIC AND ANAEROBIC 5CC  Final   Culture   Final           BLOOD CULTURE RECEIVED NO GROWTH TO DATE CULTURE WILL BE HELD FOR 5 DAYS BEFORE ISSUING A FINAL NEGATIVE REPORT Performed at [facility]    Report Status PENDING  Incomplete  Culture, blood (single)     Status: None (Preliminary result)   Collection Time: 01/22/15  9:44 PM  Result Value Ref Range Status   Specimen Description BLOOD RIGHT ARM  Final   Special Requests   Final    BOTTLES DRAWN AEROBIC AND ANAEROBIC 10CC BLUE 8CC PURPLE   Culture   Final           BLOOD CULTURE RECEIVED NO GROWTH TO DATE CULTURE WILL BE HELD FOR 5 DAYS BEFORE ISSUING A FINAL NEGATIVE REPORT Note: Culture results may be compromised due to an excessive volume of blood received in culture bottles. Performed at [facility]    Report Status PENDING  Incomplete  Culture, blood (routine x 2)     Status: None (Preliminary result)   Collection Time: 01/23/15  9:50 AM  Result Value Ref Range Status   Specimen Description BLOOD LEFT ARM  Final   Special Requests BOTTLES DRAWN AEROBIC ONLY 6CC  Final   Culture   Final           BLOOD CULTURE RECEIVED NO GROWTH TO DATE CULTURE WILL BE HELD FOR 5 DAYS BEFORE ISSUING A FINAL NEGATIVE REPORT Performed at [facility]     Report Status PENDING  Incomplete  Culture, blood (routine x 2)     Status: None (Preliminary result)   Collection Time: 01/23/15 10:00 AM  Result Value Ref Range Status   Specimen Description BLOOD LEFT HAND  Final   Special Requests BOTTLES DRAWN AEROBIC ONLY 5CC  Final   Culture   Final           BLOOD CULTURE RECEIVED NO GROWTH TO DATE CULTURE WILL BE HELD FOR 5 DAYS BEFORE ISSUING A FINAL NEGATIVE REPORT Performed at [facility]    Report Status PENDING  Incomplete  Body fluid culture       Status: None (Preliminary result)   Collection Time: 01/25/15  6:15 AM  Result Value Ref Range Status   Specimen Description GASTRIC  Final   Special Requests Normal  Final   Gram Stain   Final    NO WBC SEEN NO ORGANISMS SEEN Performed at [facility]    Culture PENDING  Incomplete   Report Status PENDING  Incomplete    Studies/Results: No results found.    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   Hypertension   Liver cirrhosis   Protein-calorie malnutrition, severe   Aspiration pneumonia   Anemia of chronic disease   Acute kidney injury   Acute respiratory failure with hypoxia   Thrombocytopenia   Pneumonia   Endotracheally intubated   Septic shock   Acute respiratory failure   ARDS (adult respiratory distress syndrome)   Fungemia   Candida glabrata infection   Candida parapsilosis infection   Candidemia   Hemodialysis catheter infection   Central line infection   Acute respiratory disease   Bilateral pneumonia   Severe sepsis   AKI (acute kidney injury)   Acute renal failure syndrome    David [name] is a 38 y.o. male with multiple medical problems cirrhosis Crohn's disease on TPN via PICC at home admitted with apparent healthcare associated pneumonia sepsis +/- intrabdominal infection now found to be fungemic with C glabrata and  C parapsilosis  #1 Persistently positive candidemia despite removal of his PICC line and initiation of micafungin. His hemodialysis catheter femoral arterial line and internal jugular vein line are all undoubtedly seeded with Candida and responsible for his persistent candidemia.  ALL LINES ARE OUT  --REPEAT BLOOD CULTURES ONCE LINES ALL OUT NGTD  --would give  "HOLIDAY FROM TPN AND CENTRAL LINE"  WITH NEGATIVE repeat blood cultures  --Greatly appreciate Dr. McCuen seeing pt and excluding fungal endophlalmitis  --greatly appreciate Cardiology performing TEE on Monday  --if TEE negative for vegetations would continue micafungin through 02/05/15 and dcing them on 02/06/15  I will followup his TEE result on Monday and if it is normal sign off   If abnormal I will reengage and we would also need CT surgery involvment  Please call with further questions.    LOS: 10 days   Cornelius Van Dam 01/25/2015, 8:05 PM   

## 2015-01-28 NOTE — Progress Notes (Signed)
[place] TEAM 1 - Stepdown/ICU TEAM Progress Note  David [name] ZOX:[id number] DOB: [birth year] DOA: 01/15/2015 PCP: [name], MD  Admit HPI / Brief Narrative: 39 year old with a very complicated past medical history including previous episodes of aspiration, Crohn's disease, cirrhosis of the liver from fatty infiltration of the liver, and peptic ulcer disease and who came to the hospital with shortness of breath. He went to see his primary care physician and wn as advised to come to the emergency department. He was found to have bilateral pneumonias in the ER.   Significant Events: 2/02 admitted to [facility] 2/03 intubated, transferred to [facility] ICU. Started on ARDS protocol 2/03 Renal consult: CRRT initiated for mgmt of severe acidosis and hyperkalemia 2/03 Gen surg consult: CT abdomen ordered. Noted that pt had difficult surgery @ [facility] 2/04 CTAP: Diffuse abnormal appearance of the bowel with multifocal bowel wall thick in. There is probable pneumatosis involving small bowel in the lower abdomen. No definite perforation. There is increased mesenteric edema and free fluid from prior exam. Decreased hydronephrosis. Splenomegaly. Worsening airspace disease in the lower lungs, left greater thanRight 2/04 ID consult for fungemia: Micafungin added 2/07 Ventilator dyssynchrony with appearance of proximal airway obstruction. Improved with suctioning. Off vasopressors. Agitation difficult to control.  2/08 Passed SBT. Remains agitated. Extubated and tolerating. Weaning vasopressors to off. Persistent fungemia on F/U BCs. ID service recommends echocardiogram and change of indwelling catheters/lines. Uo picking up with furosemide. CRRT continued.  2/09 TTE: The estimated ejection fraction was 65%. Wall motion was normal; there were no regional wall motion abnormalities. Slight dilitation of aortic root. No obvious vegetations seen 2/09 Transfer to SDU ordered 2/11 TRH assumed care  2/15 TEE - no vegetations  seen   HPI/Subjective: Pt remains sedate following his TEE.  I spoke w/ his wife at the bedside.  She is very concerned about resuming his TNA asap.  She notes he is profoundly weak.  She has not appreciated any other new complaints.    Assessment/Plan:  Severe sepsis / Persistent fungemia  -ID following - all indwelling lines have been removed  -Opthal has evaluated and ruled out fungal endophlamitis -blood cultures from 2/13 remain 2/2 NGTD thus far -PEG culture 2/14 + for Pseudomonas - significance unclear - await ID suggestions  -TEE today w/o evidence of vegetations - therefore per ID cont micafungin through 02/05/15   Suspected HCAP / Hypoxic respiratory failure -extubated 2/8 - has completed a full course of abx tx   Acute renal failure (Cr baseline 0.70 in 11/15) -crt has normalized and is approaching his baseline - GFR >90  Crohn's disease of small and large bowel.  -Per patient awaiting small bowel and liver transplant at [facility] - pt has been TNA dependent for quite some time now - place PICC and resume TNA as soon as cleared to do so per ID   Chronic gastroparesis -Patient has had a PEG tube a minimum of 2 months; per Dr. Joseph Art "GI believes that it could be a nidus for infection, however low probability therefore should leave in - ID agrees" -PEG tube fluid + for Pseudomonas - unclear of significance - will await ID discussion   Protein calorie malnutrition, severe -TPN stopped secondary to need for CVL holiday  -Patient on clear liquid diet - to resume TNA as soon as cleared for PICC by ID   Hypokalemia -due to poor intake - replete prn and follow - Mg is 2.1   Code Status: FULL Family Communication:  Spoke w/ wife at bedside at length  Disposition Plan: stable for transfer to medical bed - cont IV antifungal - place PICC and resume TNA as soon as cleared by ID - will need extensive PT/OT efforts   Consultants:  ID Nephrology PCCM  Antibiotics: Clinda 2/02    Cefepime 2/02  Vanc 2/02 > 2/09 Pip-tazo 2/02 > 2/09 Micafungin 2/03 >>   DVT prophylaxis: Add lovenox today   Devices PEG tube placed ~ December 2015   LINES / TUBES:  ETT 2/03 > 2/08 R femoral A-line 2/03 > 2/08 R IJ HD cath 2/04 > 2/08 L IJ CVL 2/04 > 2/09  Objective: Blood pressure 102/71, pulse 84, temperature 98.4 F (36.9 C), temperature source Oral, resp. rate 20, height 5\' 11"  (1.803 m), weight 65.7 kg (144 lb 13.5 oz), SpO2 97 %.  Intake/Output Summary (Last 24 hours) at 01/28/15 1106 Last data filed at 01/28/15 0936  Gross per 24 hour  Intake   2405 ml  Output    400 ml  Net   2005 ml   Exam: General: No acute respiratory distress - sedate post TEE  Lungs: Clear to auscultation bilaterally without wheezes or crackles Cardiovascular: Regular rate and rhythm without murmur gallop or rub  Abdomen: PEG insertion site dressed/dry/clean - soft, bowel sounds positive, no rebound, no ascites, no appreciable mass Extremities: No significant cyanosis, clubbing, or edema bilateral lower extremities  Data Reviewed: Basic Metabolic Panel:  Recent Labs Lab 01/22/15 0420 01/23/15 0237 01/24/15 0318 01/25/15 0326 01/26/15 0310 01/26/15 1911 01/27/15 0250 01/28/15 0251  NA 136 140 138  --  134*  --  136 135  K 3.0* 3.4* 3.5  --  2.9* 3.6 3.1* 3.3*  CL 101 103 102  --  99  --  105 108  CO2 27 23 27   --  24  --  26 22  GLUCOSE 284* 145* 146*  --  141*  --  103* 85  BUN 63* 87* 84*  --  54*  --  43* 41*  CREATININE 1.31 1.32 1.36*  --  1.00  --  1.15 1.06  CALCIUM 7.7* 7.4* 7.2*  --  7.4*  --  8.0* 7.6*  MG 2.0 2.1 2.4 2.3 2.1 2.2 1.9 2.1  PHOS 4.8* 6.5*  --   --  4.2  --  3.6 3.5   Liver Function Tests:  Recent Labs Lab 01/22/15 0420 01/23/15 0237 01/26/15 0310 01/27/15 0250 01/28/15 0251  AST  --   --  47* 54* 46*  ALT  --   --  56* 61* 62*  ALKPHOS  --   --  148* 154* 144*  BILITOT  --   --  5.8* 6.0* 4.3*  PROT  --   --  6.3 5.6* 5.4*  ALBUMIN  2.1* 2.4* 2.4* 2.7* 2.4*   CBC:  Recent Labs Lab 01/24/15 0318 01/25/15 0326 01/26/15 0310 01/27/15 0250 01/28/15 0251  WBC 9.7 4.4 4.0 4.2 8.2  NEUTROABS  --   --  3.1 2.9 6.3  HGB 9.2* 8.1* 8.4* 8.6* 8.5*  HCT 27.6* 25.2* 25.5* 26.6* 25.8*  MCV 76.0* 76.8* 76.8* 77.8* 78.4  PLT 99* 59* 70* 78* 129*   CBG:  Recent Labs Lab 01/27/15 1216 01/27/15 1624 01/27/15 1917 01/27/15 2322 01/28/15 0326  GLUCAP 98 108* 170* 163* 96    Recent Results (from the past 240 hour(s))  Culture, blood (routine x 2)     Status: None   Collection Time:  01/21/15 12:57 PM  Result Value Ref Range Status   Specimen Description BLOOD RIGHT ANTECUBITAL  Final   Special Requests BOTTLES DRAWN AEROBIC ONLY 6CC  Final   Culture   Final    NO GROWTH 5 DAYS Performed at [facility]    Report Status 01/27/2015 FINAL  Final  Culture, blood (routine x 2)     Status: None   Collection Time: 01/21/15  2:07 PM  Result Value Ref Range Status   Specimen Description BLOOD RIGHT ARM  Final   Special Requests BOTTLES DRAWN AEROBIC AND ANAEROBIC 5CC  Final   Culture   Final    NO GROWTH 5 DAYS Performed at [facility]    Report Status 01/27/2015 FINAL  Final  Culture, blood (single)     Status: None (Preliminary result)   Collection Time: 01/22/15  9:44 PM  Result Value Ref Range Status   Specimen Description BLOOD RIGHT ARM  Final   Special Requests   Final    BOTTLES DRAWN AEROBIC AND ANAEROBIC 10CC BLUE 8CC PURPLE   Culture   Final           BLOOD CULTURE RECEIVED NO GROWTH TO DATE CULTURE WILL BE HELD FOR 5 DAYS BEFORE ISSUING A FINAL NEGATIVE REPORT Note: Culture results may be compromised due to an excessive volume of blood received in culture bottles. Performed at [facility]    Report Status PENDING  Incomplete  Culture, blood (routine x 2)     Status: None (Preliminary result)   Collection Time: 01/23/15  9:50 AM  Result Value Ref Range Status   Specimen  Description BLOOD LEFT ARM  Final   Special Requests BOTTLES DRAWN AEROBIC ONLY 6CC  Final   Culture   Final           BLOOD CULTURE RECEIVED NO GROWTH TO DATE CULTURE WILL BE HELD FOR 5 DAYS BEFORE ISSUING A FINAL NEGATIVE REPORT Performed at [facility]    Report Status PENDING  Incomplete  Culture, blood (routine x 2)     Status: None (Preliminary result)   Collection Time: 01/23/15 10:00 AM  Result Value Ref Range Status   Specimen Description BLOOD LEFT HAND  Final   Special Requests BOTTLES DRAWN AEROBIC ONLY 5CC  Final   Culture   Final           BLOOD CULTURE RECEIVED NO GROWTH TO DATE CULTURE WILL BE HELD FOR 5 DAYS BEFORE ISSUING A FINAL NEGATIVE REPORT Performed at [facility]    Report Status PENDING  Incomplete  Body fluid culture     Status: None   Collection Time: 01/25/15  6:15 AM  Result Value Ref Range Status   Specimen Description GASTRIC  Final   Special Requests Normal  Final   Gram Stain   Final    NO WBC SEEN NO ORGANISMS SEEN Performed at [facility]    Culture   Final    ABUNDANT PSEUDOMONAS AERUGINOSA Performed at [facility]    Report Status 01/28/2015 FINAL  Final   Organism ID, Bacteria PSEUDOMONAS AERUGINOSA  Final      Susceptibility   Pseudomonas aeruginosa - MIC*    CEFEPIME >=64 RESISTANT Resistant     CEFTAZIDIME >=64 RESISTANT Resistant     CIPROFLOXACIN 1 SENSITIVE Sensitive     GENTAMICIN 4 SENSITIVE Sensitive     IMIPENEM >=16 RESISTANT Resistant     TOBRAMYCIN <=1 SENSITIVE Sensitive     *  ABUNDANT PSEUDOMONAS AERUGINOSA  Culture, blood (routine x 2)     Status: None (Preliminary result)   Collection Time: 01/26/15  7:07 PM  Result Value Ref Range Status   Specimen Description BLOOD LEFT ARM  Final   Special Requests BOTTLES DRAWN AEROBIC AND ANAEROBIC 5CC  Final   Culture   Final           BLOOD CULTURE RECEIVED NO GROWTH TO DATE CULTURE WILL BE HELD FOR 5 DAYS BEFORE ISSUING A FINAL NEGATIVE  REPORT Performed at [facility]    Report Status PENDING  Incomplete  Culture, blood (routine x 2)     Status: None (Preliminary result)   Collection Time: 01/26/15  7:11 PM  Result Value Ref Range Status   Specimen Description BLOOD LEFT HAND  Final   Special Requests BOTTLES DRAWN AEROBIC AND ANAEROBIC 5CC  Final   Culture   Final           BLOOD CULTURE RECEIVED NO GROWTH TO DATE CULTURE WILL BE HELD FOR 5 DAYS BEFORE ISSUING A FINAL NEGATIVE REPORT Performed at [facility]    Report Status PENDING  Incomplete  Wound culture     Status: None (Preliminary result)   Collection Time: 01/27/15  4:53 PM  Result Value Ref Range Status   Specimen Description WOUND ABDOMEN  Final   Special Requests NONE  Final   Gram Stain PENDING  Incomplete   Culture   Final    Culture reincubated for better growth Performed at [facility]    Report Status PENDING  Incomplete     Studies:  Recent x-ray studies have been reviewed in detail by the Attending Physician  Scheduled Meds:  Scheduled Meds: . chlorhexidine  15 mL Mouth Rinse BID  . hydrocortisone sod succinate (SOLU-CORTEF) inj  25 mg Intravenous Q12H  . insulin aspart  0-20 Units Subcutaneous 6 times per day  . micafungin (MYCAMINE) IV  100 mg Intravenous QHS  . pantoprazole (PROTONIX) IV  40 mg Intravenous Q12H    Time spent on care of this patient: 35 mins  Lonia Blood, MD [facility] For Consults/Admissions - Flow Manager - [phone] Office  [phone]  Contact MD directly via text page:      [email or web address]      password [facility]  01/28/2015, 11:06 AM   LOS: 13 days

## 2015-01-28 NOTE — Progress Notes (Signed)
Pt returned from TEE. Sleepy but rousable. VSS.

## 2015-01-28 NOTE — Progress Notes (Signed)
Pt transferred to 6N bed 1. Family notified of new location.

## 2015-01-29 LAB — COMPREHENSIVE METABOLIC PANEL
ALBUMIN: 2.5 g/dL — AB (ref 3.5–5.2)
ALT: 58 U/L — ABNORMAL HIGH (ref 0–53)
ANION GAP: 10 (ref 5–15)
AST: 47 U/L — ABNORMAL HIGH (ref 0–37)
Alkaline Phosphatase: 168 U/L — ABNORMAL HIGH (ref 39–117)
BUN: 28 mg/dL — ABNORMAL HIGH (ref 6–23)
CALCIUM: 7.7 mg/dL — AB (ref 8.4–10.5)
CO2: 18 mmol/L — ABNORMAL LOW (ref 19–32)
CREATININE: 1.04 mg/dL (ref 0.50–1.35)
Chloride: 108 mmol/L (ref 96–112)
GFR calc Af Amer: >90 mL/min (ref >90)
GFR calc non Af Amer: 90 mL/min — ABNORMAL LOW (ref >90)
Glucose, Bld: 125 mg/dL — ABNORMAL HIGH (ref 70–99)
Potassium: 3 mmol/L — ABNORMAL LOW (ref 3.5–5.1)
Sodium: 136 mmol/L (ref 135–145)
Total Bilirubin: 3.5 mg/dL — ABNORMAL HIGH (ref 0.3–1.2)
Total Protein: 5.3 g/dL — ABNORMAL LOW (ref 6.0–8.3)

## 2015-01-29 LAB — CULTURE, BLOOD (ROUTINE X 2)
CULTURE: NO GROWTH
Culture: NO GROWTH

## 2015-01-29 LAB — CULTURE, BLOOD (SINGLE): Culture: NO GROWTH

## 2015-01-29 LAB — GLUCOSE, CAPILLARY
Glucose-Capillary: 141 mg/dL — ABNORMAL HIGH (ref 70–99)
Glucose-Capillary: 169 mg/dL — ABNORMAL HIGH (ref 70–99)
Glucose-Capillary: 127 mg/dL — ABNORMAL HIGH (ref 70–99)

## 2015-01-29 MED ORDER — PREDNISONE 20 MG PO TABS
20.0000 mg | ORAL_TABLET | Freq: Every day | ORAL | Status: DC
  Administered 2015-01-30 – 2015-01-31 (×2): 20 mg via ORAL
  Filled 2015-01-29 (×2): qty 1

## 2015-01-29 MED ORDER — BOOST / RESOURCE BREEZE PO LIQD
1.0000 | Freq: Three times a day (TID) | ORAL | Status: DC
  Administered 2015-01-29 – 2015-01-31 (×8): 1 via ORAL

## 2015-01-29 MED ORDER — POTASSIUM CHLORIDE 10 MEQ/100ML IV SOLN
10.0000 meq | INTRAVENOUS | Status: AC
  Administered 2015-01-29 (×4): 10 meq via INTRAVENOUS
  Filled 2015-01-29 (×3): qty 100

## 2015-01-29 MED ORDER — INSULIN ASPART 100 UNIT/ML ~~LOC~~ SOLN
0.0000 [IU] | Freq: Three times a day (TID) | SUBCUTANEOUS | Status: DC
  Administered 2015-01-30: 4 [IU] via SUBCUTANEOUS
  Administered 2015-01-30: 3 [IU] via SUBCUTANEOUS
  Administered 2015-01-31: 4 [IU] via SUBCUTANEOUS

## 2015-01-29 NOTE — Progress Notes (Signed)
Physical Therapy Treatment Patient Details Name: David [name] MRN: [id number] DOB: [birth year] Today's Date: 01/29/2015    History of Present Illness 39 y.o. M who presented to [facility] ED on 2/2 with 2 days of dyspnea, cough, fevers.  He was found to be hypoxic requiring NRB and CXR suggestive of HCAP/ARDS/ fungemia. Pt with Chron's awaiting small bowel transplant at [facility]    PT Comments    Patient with limited participation due to fatigue (recently back to bed after up in chair) and abd pain. Very receptive to education on bed and chair exercises he can do on his own and hand-out provided. Agrees to attempt ambulation (possibly with cane) next visit. Agrees to request assistance from nursing to ambulate in halls bid.    Follow Up Recommendations  Home health PT;Supervision for mobility/OOB     Equipment Recommendations  Rolling walker with 5" wheels;3in1 (PT) (RW  TBA next visit (may no longer need))    Recommendations for Other Services       Precautions / Restrictions Precautions Precautions: Fall Precaution Comments: peg    Mobility  Bed Mobility                  Transfers                    Ambulation/Gait             General Gait Details: Pt deferred gait; discussed he has been holding onto IV pole to walk and ? will need cane on d/c. He reports he owns a cane.   Stairs            Wheelchair Mobility    Modified Rankin (Stroke Patients Only)       Balance                                    Cognition Arousal/Alertness: Awake/alert Behavior During Therapy: Flat affect Overall Cognitive Status: Within Functional Limits for tasks assessed                      Exercises General Exercises - Upper Extremity Chair Push Up:  (Provided handout and verbal instruction) General Exercises - Lower Extremity Ankle Circles/Pumps:  (Provided handout and verbal instruction) Long Arc Quad:  (Provided handout and verbal  instruction) Heel Slides:  (Provided handout and verbal instruction) Straight Leg Raises:  (Provided handout and verbal instruction) Hip Flexion/Marching:  (Provided handout and verbal instruction) Low Level/ICU Exercises Stabilized Bridging:  (Provided handout and verbal instruction)    General Comments General comments (skin integrity, edema, etc.): Pt reports he just returned to bed after being up to chair this morning. Reports little sleep due to abd pain. Agreed to education on general strengthening exercise program which can be completed in supine and/or sitting (educated not to attempt standing exercises alone). Pt deferred attempting exercises, however very attentive during education and asking appropriate questions. Educated pt to ambulate 2x/day minimum in halls with nursing assistance.      Pertinent Vitals/Pain Pain Assessment: 0-10 Pain Score: 4  Pain Location: abd Pain Intervention(s): Limited activity within patient's tolerance    Home Living                      Prior Function            PT Goals (current goals can now be found in the  care plan section) Acute Rehab PT Goals Patient Stated Goal: go home, build reserves before 13-14 hr surgery at [facility] Progress towards PT goals: Progressing toward goals    Frequency  Min 3X/week    PT Plan Current plan remains appropriate    Co-evaluation             End of Session   Activity Tolerance: Patient limited by pain;Patient limited by fatigue Patient left: in bed;with call bell/phone within reach;with bed alarm set     Time: 1914-7829 PT Time Calculation (min) (ACUTE ONLY): 10 min  Charges:  $Therapeutic Exercise: 8-22 mins                    G Codes:      David [name] February 18, 2015, 11:58 AM Pager [phone]

## 2015-01-29 NOTE — Evaluation (Signed)
Occupational Therapy Evaluation Patient Details Name: David [name] MRN: [id number] DOB: Apr 07, 1976 Today's Date: 01/29/2015    History of Present Illness 39 y.o. M who presented to [facility] ED on 2/2 with 2 days of dyspnea, cough, fevers.  He was found to be hypoxic requiring NRB and CXR suggestive of HCAP/ARDS/ fungemia. Pt with Chron's awaiting small bowel transplant at [facility]   Clinical Impression   Pt declined any EOB sitting or OOB activity this session for eval. OT educated pt on benefits/importance of mobility and being OOB. Pt continued to decline, but agreeable to bed level activity. Pt demonstrates decline in function and safety with ADLs and ADL mobility. Pt states that he is ambulating to and from the bathroom without assist. Pt encouraged to call for assist when getting OOB. Pt would benefit from acute OT services to address impairments to increase level of function to return home safely    Follow Up Recommendations  Home health OT;Supervision/Assistance - 24 hour    Equipment Recommendations  Tub/shower seat    Recommendations for Other Services       Precautions / Restrictions Precautions Precautions: Fall Precaution Comments: peg Restrictions Weight Bearing Restrictions: No      Mobility Bed Mobility Overal bed mobility: Needs Assistance Bed Mobility: Rolling Rolling: Min guard         General bed mobility comments: pt refused EOB sitting, agreeable to rolling  Transfers                 General transfer comment: pt refused transfers, OOB activity. Per PT, pt is min guard A with transfers    Balance       Sitting balance - Comments: pt refused EOB/OOB activity       Standing balance comment: pt refused EOB/OOB activity                            ADL Overall ADL's : Needs assistance/impaired     Grooming: Wash/dry hands;Wash/dry face;Bed level;Set up   Upper Body Bathing: Minimal assitance;Bed level   Lower Body Bathing: Bed  level;Moderate assistance   Upper Body Dressing : Minimal assistance;Bed level   Lower Body Dressing: Bed level;Maximal assistance     Toilet Transfer Details (indicate cue type and reason): pt refused transfers, OOB activity. Per PT, pt is min guard A with transfers and that he is ambulating to bathroom wihtout assist         Functional mobility during ADLs:  (pt refused transfers, OOB activity. Per PT, pt is min guard A with transfers) General ADL Comments: pt declined any EON sitting activity or OOB activity, only agreeable to bed level activity     Vision  no change from baseline              Pertinent Vitals/Pain Pain Assessment: 0-10 Pain Score: 5  Pain Location: abd Pain Descriptors / Indicators: Throbbing Pain Intervention(s): Limited activity within patient's tolerance     Hand Dominance Right   Extremity/Trunk Assessment Upper Extremity Assessment Upper Extremity Assessment: Overall WFL for tasks assessed;Generalized weakness   Lower Extremity Assessment Lower Extremity Assessment: Defer to PT evaluation   Cervical / Trunk Assessment Cervical / Trunk Assessment: Normal   Communication Communication Communication: No difficulties   Cognition Arousal/Alertness: Awake/alert Behavior During Therapy: Flat affect Overall Cognitive Status: Within Functional Limits for tasks assessed  General Comments   Pt not very motivated            Home Living Family/patient expects to be discharged to:: Private residence Living Arrangements: Spouse/significant other Available Help at Discharge: Family;Available PRN/intermittently Type of Home: House Home Access: Stairs to enter [employer] of Steps: 4 Entrance Stairs-Rails: Right;Left;Can reach both Home Layout: One level     Bathroom Shower/Tub: Walk-in shower;Tub/shower unit   Bathroom Toilet: Handicapped height     Home Equipment: Grab bars - toilet;Cane - single  point          Prior Functioning/Environment Level of Independence: Independent        Comments: uses a cane outside at times, independent with ADLs    OT Diagnosis: Generalized weakness;Acute pain   OT Problem List: Decreased strength;Decreased activity tolerance;Pain;Impaired balance (sitting and/or standing)   OT Treatment/Interventions: Self-care/ADL training;Therapeutic exercise;DME and/or AE instruction;Neuromuscular education;Therapeutic activities;Patient/family education    OT Goals(Current goals can be found in the care plan section) Acute Rehab OT Goals Patient Stated Goal: go home OT Goal Formulation: With patient Time For Goal Achievement: 02/05/15 Potential to Achieve Goals: Good ADL Goals Pt Will Perform Grooming: with set-up;with supervision;with min guard assist;with min assist;sitting Pt Will Perform Upper Body Bathing: with min guard assist;with supervision;with set-up;sitting Pt Will Perform Lower Body Bathing: with min assist;sit to/from stand;sitting/lateral leans Pt Will Perform Upper Body Dressing: with supervision;with set-up;with min guard assist;sitting Pt Will Perform Lower Body Dressing: with mod assist;sitting/lateral leans;sit to/from stand Pt Will Transfer to Toilet: with min assist;with min guard assist;regular height toilet;grab bars Pt Will Perform Toileting - Clothing Manipulation and hygiene: with mod assist;with min assist;sit to/from stand Pt Will Perform Tub/Shower Transfer: with min assist;with min guard assist;shower seat;grab bars  OT Frequency: Min 2X/week   Barriers to D/C:    none                     End of Session    Activity Tolerance: Patient limited by fatigue Patient left: in bed;with call bell/phone within reach   Time: 1422-1438 OT Time Calculation (min): 16 min Charges:  OT General Charges $OT Visit: 1 Procedure OT Evaluation $Initial OT Evaluation Tier I: 1 Procedure G-Codes:    Galen Manila 01/29/2015, 2:55 PM

## 2015-01-29 NOTE — Progress Notes (Signed)
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 He went to see his primary care physician and wn as advised to come to the emergency department. He was found to have bilateral pneumonias in the ER.   Significant Events: 2/02 admitted to [facility] 2/03 intubated, transferred to [facility] ICU. Started on ARDS protocol 2/03 Renal consult: CRRT initiated for mgmt of severe acidosis and hyperkalemia 2/03 Gen surg consult: CT abdomen ordered. Noted that pt had difficult surgery @ [facility] 2/04 CTAP: Diffuse abnormal appearance of the bowel with multifocal bowel wall thick in. There is probable pneumatosis involving small bowel in the lower abdomen. No definite perforation. There is increased mesenteric edema and free fluid from prior exam. Decreased hydronephrosis. Splenomegaly. Worsening airspace disease in the lower lungs, left greater thanRight 2/04 ID consult for fungemia: Micafungin added 2/07 Ventilator dyssynchrony with appearance of proximal airway obstruction. Improved with suctioning. Off vasopressors. Agitation difficult to control.  2/08 Passed SBT. Remains agitated. Extubated and tolerating. Weaning vasopressors to off. Persistent fungemia on F/U BCs. ID service recommends echocardiogram and change of indwelling catheters/lines. Uo picking up with furosemide. CRRT continued.  2/09 TTE: The estimated ejection fraction was 65%. Wall motion was normal; there were no regional wall motion abnormalities. Slight dilitation of aortic root. No obvious vegetations seen 2/09 Transfer to SDU ordered 2/11 TRH assumed care  2/15 TEE - no vegetations seen   HPI/Subjective: Pt remains  sedate following his TEE.  I spoke w/ his wife at the bedside.  She is very concerned about resuming his TNA asap.  She notes he is profoundly weak.  She has not appreciated any other new complaints.    Assessment/Plan:  Severe sepsis / Persistent fungemia  Per ID, may place PICC tomorrow and resume TPN -Opthal has evaluated and ruled out fungal endophlamitis -blood cultures from 2/13 remain 2/2 NGTD thus far -TEE today w/o evidence of vegetations - therefore per ID cont micafungin through 02/05/15. Will arrange with home health  Suspected HCAP / Hypoxic respiratory failure -extubated 2/8 - has completed a full course of abx tx   Acute renal failure (Cr baseline 0.70 in 11/15) S/p CRRT. resolved  Crohn's disease of small and large bowel.  -Per patient awaiting small bowel and liver transplant at [facility] - pt has been TNA dependent. Replace picc tomorrow and resume TPN  Chronic gastroparesis -Patient has had a PEG tube a minimum of 2 months Tolerating clears. Eats some at home. Will advance diet  Protein calorie malnutrition, severe -TPN stopped secondary to need for CVL holiday   Hypokalemia replete  Code Status: FULL Family Communication:  Family at bedside  Disposition Plan: home with home health after picc  Consultants:  ID Nephrology PCCM  Antibiotics: Clinda 2/02  Cefepime 2/02  Vanc 2/02 > 2/09 Pip-tazo 2/02 > 2/09 Micafungin 2/03 >>   DVT prophylaxis: Add lovenox today   Devices PEG tube placed ~ December 2015   LINES / TUBES:  ETT 2/03 > 2/08 R femoral A-line 2/03 > 2/08 R IJ HD cath 2/04 > 2/08 L IJ CVL 2/04 > 2/09  Objective: Blood pressure 106/73, pulse 74, temperature 98.1 F (36.7 C), temperature source Oral, resp. rate 20, height  5\' 11"  (1.803 m), weight 72.8 kg (160 lb 7.9 oz), SpO2 100 %.  Intake/Output Summary (Last 24 hours) at 01/29/15 1613 Last data filed at 01/29/15 0957  Gross per 24 hour  Intake    480 ml  Output   3100 ml  Net   -2620 ml   Exam: General: chronically ill appearing. A and o Lungs: Clear to auscultation bilaterally without wheezes or crackles Cardiovascular: Regular rate and rhythm without murmur gallop or rub  Abdomen: PEG insertion site dressed/dry/clean - soft, bowel sounds positive, no rebound, no ascites, no appreciable mass Extremities: No significant cyanosis, clubbing, or edema bilateral lower extremities  Data Reviewed: Basic Metabolic Panel:  Recent Labs Lab 01/23/15 0237 01/24/15 0318 01/25/15 0326 01/26/15 0310 01/26/15 1911 01/27/15 0250 01/28/15 0251 01/29/15 0335  NA 140 138  --  134*  --  136 135 136  K 3.4* 3.5  --  2.9* 3.6 3.1* 3.3* 3.0*  CL 103 102  --  99  --  105 108 108  CO2 23 27  --  24  --  26 22 18*  GLUCOSE 145* 146*  --  141*  --  103* 85 125*  BUN 87* 84*  --  54*  --  43* 41* 28*  CREATININE 1.32 1.36*  --  1.00  --  1.15 1.06 1.04  CALCIUM 7.4* 7.2*  --  7.4*  --  8.0* 7.6* 7.7*  MG 2.1 2.4 2.3 2.1 2.2 1.9 2.1  --   PHOS 6.5*  --   --  4.2  --  3.6 3.5  --    Liver Function Tests:  Recent Labs Lab 01/23/15 0237 01/26/15 0310 01/27/15 0250 01/28/15 0251 01/29/15 0335  AST  --  47* 54* 46* 47*  ALT  --  56* 61* 62* 58*  ALKPHOS  --  148* 154* 144* 168*  BILITOT  --  5.8* 6.0* 4.3* 3.5*  PROT  --  6.3 5.6* 5.4* 5.3*  ALBUMIN 2.4* 2.4* 2.7* 2.4* 2.5*   CBC:  Recent Labs Lab 01/24/15 0318 01/25/15 0326 01/26/15 0310 01/27/15 0250 01/28/15 0251  WBC 9.7 4.4 4.0 4.2 8.2  NEUTROABS  --   --  3.1 2.9 6.3  HGB 9.2* 8.1* 8.4* 8.6* 8.5*  HCT 27.6* 25.2* 25.5* 26.6* 25.8*  MCV 76.0* 76.8* 76.8* 77.8* 78.4  PLT 99* 59* 70* 78* 129*   CBG:  Recent Labs Lab 01/27/15 1917 01/27/15 2322 01/28/15 0326 01/28/15 1228 01/29/15 1208  GLUCAP 170* 163* 96 98 141*    Recent Results (from the past 240 hour(s))  Culture, blood (routine x 2)     Status: None   Collection Time: 01/21/15 12:57 PM  Result Value Ref Range Status   Specimen  Description BLOOD RIGHT ANTECUBITAL  Final   Special Requests BOTTLES DRAWN AEROBIC ONLY 6CC  Final   Culture   Final    NO GROWTH 5 DAYS Performed at [facility]    Report Status 01/27/2015 FINAL  Final  Culture, blood (routine x 2)     Status: None   Collection Time: 01/21/15  2:07 PM  Result Value Ref Range Status   Specimen Description BLOOD RIGHT ARM  Final   Special Requests BOTTLES DRAWN AEROBIC AND ANAEROBIC 5CC  Final   Culture   Final    NO GROWTH 5 DAYS Performed at [facility]    Report Status 01/27/2015 FINAL  Final  Culture, blood (single)     Status:  None   Collection Time: 01/22/15  9:44 PM  Result Value Ref Range Status   Specimen Description BLOOD RIGHT ARM  Final   Special Requests   Final    BOTTLES DRAWN AEROBIC AND ANAEROBIC 10CC BLUE 8CC PURPLE   Culture   Final    NO GROWTH 5 DAYS Note: Culture results may be compromised due to an excessive volume of blood received in culture bottles. Performed at [facility]    Report Status 01/29/2015 FINAL  Final  Culture, blood (routine x 2)     Status: None   Collection Time: 01/23/15  9:50 AM  Result Value Ref Range Status   Specimen Description BLOOD LEFT ARM  Final   Special Requests BOTTLES DRAWN AEROBIC ONLY 6CC  Final   Culture   Final    NO GROWTH 5 DAYS Performed at [facility]    Report Status 01/29/2015 FINAL  Final  Culture, blood (routine x 2)     Status: None   Collection Time: 01/23/15 10:00 AM  Result Value Ref Range Status   Specimen Description BLOOD LEFT HAND  Final   Special Requests BOTTLES DRAWN AEROBIC ONLY 5CC  Final   Culture   Final    NO GROWTH 5 DAYS Performed at [facility]    Report Status 01/29/2015 FINAL  Final  Body fluid culture     Status: None   Collection Time: 01/25/15  6:15 AM  Result Value Ref Range Status   Specimen Description GASTRIC  Final   Special Requests Normal  Final   Gram Stain   Final    NO WBC SEEN NO  ORGANISMS SEEN Performed at [facility]    Culture   Final    ABUNDANT PSEUDOMONAS AERUGINOSA Performed at [facility]    Report Status 01/28/2015 FINAL  Final   Organism ID, Bacteria PSEUDOMONAS AERUGINOSA  Final      Susceptibility   Pseudomonas aeruginosa - MIC*    CEFEPIME >=64 RESISTANT Resistant     CEFTAZIDIME >=64 RESISTANT Resistant     CIPROFLOXACIN 1 SENSITIVE Sensitive     GENTAMICIN 4 SENSITIVE Sensitive     IMIPENEM >=16 RESISTANT Resistant     TOBRAMYCIN <=1 SENSITIVE Sensitive     * ABUNDANT PSEUDOMONAS AERUGINOSA  Culture, blood (routine x 2)     Status: None (Preliminary result)   Collection Time: 01/26/15  7:07 PM  Result Value Ref Range Status   Specimen Description BLOOD LEFT ARM  Final   Special Requests BOTTLES DRAWN AEROBIC AND ANAEROBIC 5CC  Final   Culture   Final           BLOOD CULTURE RECEIVED NO GROWTH TO DATE CULTURE WILL BE HELD FOR 5 DAYS BEFORE ISSUING A FINAL NEGATIVE REPORT Performed at [facility]    Report Status PENDING  Incomplete  Culture, blood (routine x 2)     Status: None (Preliminary result)   Collection Time: 01/26/15  7:11 PM  Result Value Ref Range Status   Specimen Description BLOOD LEFT HAND  Final   Special Requests BOTTLES DRAWN AEROBIC AND ANAEROBIC 5CC  Final   Culture   Final           BLOOD CULTURE RECEIVED NO GROWTH TO DATE CULTURE WILL BE HELD FOR 5 DAYS BEFORE ISSUING A FINAL NEGATIVE REPORT Performed at [facility]    Report Status PENDING  Incomplete  Wound culture     Status: None (  Preliminary result)   Collection Time: 01/27/15  4:53 PM  Result Value Ref Range Status   Specimen Description WOUND ABDOMEN  Final   Special Requests NONE  Final   Gram Stain   Final    MODERATE WBC PRESENT, PREDOMINANTLY PMN NO SQUAMOUS EPITHELIAL CELLS SEEN FEW GRAM NEGATIVE RODS Performed at [facility]    Culture   Final    ABUNDANT GRAM NEGATIVE RODS ABUNDANT PSEUDOMONAS  AERUGINOSA Performed at [facility]    Report Status PENDING  Incomplete     Studies:  Recent x-ray studies have been reviewed in detail by the Attending Physician  Scheduled Meds:  Scheduled Meds: . chlorhexidine  15 mL Mouth Rinse BID  . enoxaparin (LOVENOX) injection  40 mg Subcutaneous Q24H  . feeding supplement (RESOURCE BREEZE)  1 Container Oral TID BM  . hydrocortisone sod succinate (SOLU-CORTEF) inj  25 mg Intravenous Daily  . insulin aspart  0-20 Units Subcutaneous 6 times per day  . micafungin (MYCAMINE) IV  100 mg Intravenous QHS  . pantoprazole (PROTONIX) IV  40 mg Intravenous QHS  . potassium chloride  10 mEq Intravenous Q1 Hr x 4    Time spent on care of this patient: 25 mins  Crista Curb, MD [facility]       [email or web address]      password [facility]  01/29/2015, 4:13 PM   LOS: 14 days

## 2015-01-29 NOTE — Progress Notes (Signed)
NUTRITION FOLLOW UP  Intervention:   -Resume TPN as able -Resource Breeze po TID, each supplement provides 250 kcal and 9 grams of protein  Nutrition Dx:   Inadequate oral intake related to altered GI function as evidenced by clear liquids/NPO since admission, TPN off. Ongoing.  Goal:   Intake to meet >90% of estimated nutrition needs. Unmet.  Monitor:   PO/supplement intake, TPN tolerance/adequacy, weight trend, labs,   Assessment:   Patient presented to [facility] on 2/2 with 2 days of dyspnea, cough, and fever. Transferred to [facility] on 2/3 with progressive respiratory failure requiring intubation. 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 Being evaluated at [facility] for small bowel transplant.  S/p Procedure(s) on 01/28/15: TRANSESOPHAGEAL ECHOCARDIOGRAM (TEE)  No vegetations present on TEE. TPN remains off secondary to sepsis. PEG culture positive for pseudomonas. Pt remains on "line holiday" secondary to infection.  Pt is on a clear liquid diet and tolerating well. Noted 75-100% meal completion. RD will add clear liquid supplement to maximize nutritional status, as pt typically receives the majority of his nutrition via home TPN and increased nutrition needs for wound healing.  Wt remains stable.  Labs reviewed. K: 3.0, CO2: 18, BUN: 28, Calcium: 7.7, Glucose: 125, CBGS: 98-163.   Height: Ht Readings from Last 1 Encounters:  01/24/15  (1.803 m)    Weight Status:   Wt Readings from Last 1 Encounters:  01/29/15 160 lb 7.9 oz (72.8 kg)   01/23/15 161 lb 13.1 oz (73.4 kg)   01/17/15 196 lb 3.4 oz (89 kg)           Re-estimated needs:  Kcal: 2400-2600 Protein: 130-140 grams Fluid: >2.4 L  Skin: stage II pressure ulcer on sacrum  Diet Order: Diet clear liquid   Intake/Output Summary (Last 24 hours) at 01/29/15 1002 Last data filed at 01/29/15 0957  Gross per 24 hour  Intake  837.5 ml  Output   3550 ml  Net  -2712.5 ml    Last BM: 01/27/15   Labs:   Recent Labs Lab 01/26/15 0310 01/26/15 1911 01/27/15 0250 01/28/15 0251 01/29/15 0335  NA 134*  --  136 135 136  K 2.9* 3.6 3.1* 3.3* 3.0*  CL 99  --  105 108 108  CO2 24  --  26 22 18*  BUN 54*  --  43* 41* 28*  CREATININE 1.00  --  1.15 1.06 1.04  CALCIUM 7.4*  --  8.0* 7.6* 7.7*  MG 2.1 2.2 1.9 2.1  --   PHOS 4.2  --  3.6 3.5  --   GLUCOSE 141*  --  103* 85 125*    CBG (last 3)   Recent Labs  01/27/15 2322 01/28/15 0326 01/28/15 1228  GLUCAP 163* 96 98    Scheduled Meds: . chlorhexidine  15 mL Mouth Rinse BID  . enoxaparin (LOVENOX) injection  40 mg Subcutaneous Q24H  . hydrocortisone sod succinate (SOLU-CORTEF) inj  25 mg Intravenous Daily  . insulin aspart  0-20 Units Subcutaneous 6 times per day  . micafungin (MYCAMINE) IV  100 mg Intravenous QHS  . pantoprazole (PROTONIX) IV  40 mg Intravenous QHS    Continuous Infusions: . dextrose 5 % and 0.9 % NaCl with KCl 20 mEq/L 75 mL/hr at 01/28/15 1400    Nadeem Romanoski A. Mayford Knife, RD, LDN, CDE Pager: [phone] After hours Pager: [phone]

## 2015-01-30 LAB — GLUCOSE, CAPILLARY
GLUCOSE-CAPILLARY: 103 mg/dL — AB (ref 70–99)
GLUCOSE-CAPILLARY: 150 mg/dL — AB (ref 70–99)
Glucose-Capillary: 187 mg/dL — ABNORMAL HIGH (ref 70–99)
Glucose-Capillary: 147 mg/dL — ABNORMAL HIGH (ref 70–99)

## 2015-01-30 MED ORDER — FAT EMULSION 20 % IV EMUL
240.0000 mL | INTRAVENOUS | Status: DC
  Administered 2015-01-30: 240 mL via INTRAVENOUS
  Filled 2015-01-30: qty 250

## 2015-01-30 MED ORDER — DEXTROSE-NACL 5-0.9 % IV SOLN
INTRAVENOUS | Status: DC
  Administered 2015-01-30: 14:00:00 via INTRAVENOUS

## 2015-01-30 MED ORDER — SODIUM CHLORIDE 0.9 % IJ SOLN: 10.0000 mL | INTRAMUSCULAR | Status: DC | PRN

## 2015-01-30 MED ORDER — KCL IN DEXTROSE-NACL 20-5-0.9 MEQ/L-%-% IV SOLN
INTRAVENOUS | Status: DC
  Filled 2015-01-30: qty 1000

## 2015-01-30 MED ORDER — TRACE MINERALS CR-CU-F-FE-I-MN-MO-SE-ZN IV SOLN
INTRAVENOUS | Status: DC
  Administered 2015-01-30: 18:00:00 via INTRAVENOUS
  Filled 2015-01-30: qty 1200

## 2015-01-30 MED ORDER — POTASSIUM CHLORIDE 10 MEQ/50ML IV SOLN
10.0000 meq | INTRAVENOUS | Status: AC
  Administered 2015-01-30 (×3): 10 meq via INTRAVENOUS
  Filled 2015-01-30 (×3): qty 50

## 2015-01-30 NOTE — Progress Notes (Signed)
PT Cancellation Note  Patient Details Name: David [name] MRN: [id number] DOB: Mar 26, 1976   Cancelled Treatment:    Reason Eval/Treat Not Completed: Patient at procedure or test/unavailable. Getting PICC line   [name] 01/30/2015, 10:58 AM Pager [phone]

## 2015-01-30 NOTE — Progress Notes (Signed)
Peripherally Inserted Central Catheter/Midline Placement  The IV Nurse has discussed with the patient and/or persons authorized to consent for the patient, the purpose of this procedure and the potential benefits and risks involved with this procedure.  The benefits include less needle sticks, lab draws from the catheter and patient may be discharged home with the catheter.  Risks include, but not limited to, infection, bleeding, blood clot (thrombus formation), and puncture of an artery; nerve damage and irregular heat beat.  Alternatives to this procedure were also discussed.  PICC/Midline Placement Documentation        David [name] 01/30/2015, 11:08 AM

## 2015-01-30 NOTE — Progress Notes (Signed)
PARENTERAL NUTRITION CONSULT NOTE - INITIAL  Pharmacy Consult for TPN Indication: End-stage Crohn's, on chronic TPN  Allergies  Allergen Reactions  . Remicade [Infliximab]     Dr Laural Golden states previous respiratory arrest not related to remicade. Dr Laural Golden states remicade not a drug related allergy. 07-07-2013 at 1025 rapid response called to PACU- Patient difficulty breathing after infusion of Remicade infusing. Do NOT Give Remicade!  . Fentanyl And Related Itching    Swelling, Redness Tolerated fentanyl 01/2015  . Alfentanil Itching    Swelling, Redness  . Wellbutrin [Bupropion] Nausea And Vomiting  . Morphine And Related Hives    Patient Measurements: Height: _0  (180.3 cm) Weight: 164 lb 0.4 oz (74.4 kg) IBW/kg (Calculated) : 75.3 Adjusted Body Weight:  Usual Weight:   Vital Signs: Temp: 98.6 F (37 C) (02/17 0527) Temp Source: Oral (02/17 0527) BP: 114/70 mmHg (02/17 0527) Pulse Rate: 79 (02/17 0527) Intake/Output from previous day: 02/16 0701 - 02/17 0700 In: 480 [P.O.:480] Out: 3525 [Urine:300; Drains:3225] Intake/Output from this shift: Total I/O In: 2967.2 [P.O.:480; I.V.:2487.2] Out: -   Labs:  Recent Labs  01/28/15 0251  WBC 8.2  HGB 8.5*  HCT 25.8*  PLT 129*     Recent Labs  01/28/15 0251 01/29/15 0335  NA 135 136  K 3.3* 3.0*  CL 108 108  CO2 22 18*  GLUCOSE 85 125*  BUN 41* 28*  CREATININE 1.06 1.04  CALCIUM 7.6* 7.7*  MG 2.1  --   PHOS 3.5  --   PROT 5.4* 5.3*  ALBUMIN 2.4* 2.5*  AST 46* 47*  ALT 62* 58*  ALKPHOS 144* 168*  BILITOT 4.3* 3.5*  PREALBUMIN 18.5  --    Estimated Creatinine Clearance: 101.3 mL/min (by C-G formula based on Cr of 1.04).    Recent Labs  01/29/15 2002 01/30/15 0740 01/30/15 1146  GLUCAP 127* 103* 187*    Medical History: Past Medical History  Diagnosis Date  . 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  Hepatomegaly 05/04/2011  . Fatty liver 05/04/2011    "stage III fatty liver fibrosis" (11/15/2012)  . GERD (gastroesophageal reflux disease)   . Chronic liver disease     /notes 11/15/2012  . Pericarditis     Archie Endo 11/15/2012  . Hypertension   . Pneumonia 1977  . Shortness of breath     "all the time right now" (11/15/2012)  . History of blood transfusion 2004  . History of stomach ulcers   . Duodenal ulcer   . Depression   . Kidney stones     bilaterally/notes 11/15/2012  . Hepatic fibrosis     Archie Endo 11/15/2012  . Anemia   . Pericardial effusion 10/29/2012    moderate to large/notes 11/15/2012  . Anxiety   . Hepatitis   . Crohn's disease   . ED (erectile dysfunction)   . 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 chlorhexidine  15 mL Mouth Rinse BID  . enoxaparin (LOVENOX) injection  40 mg Subcutaneous Q24H  . feeding supplement (RESOURCE BREEZE)  1 Container Oral TID BM  . insulin aspart  0-20 Units Subcutaneous TID WC  . micafungin (MYCAMINE) IV  100 mg Intravenous QHS  . pantoprazole (PROTONIX) IV  40 mg Intravenous QHS  . predniSONE  20 mg Oral Q breakfast    Insulin Requirements in the  past 24 hours:  Resistant SSI tid with meals, required 8 units in the last 24hr  Current Nutrition:  Soft diet, poor absorption & requires TPN as well 2/9 Plan- Clinimix E 5/15 to 90 ml/hr + 20% lipids at 10 ml/hr, providing 2099 kCal and 114gm protein daily.  Home TPN:  [facility]: 2150 kCal, 115 g protein per day (TPN included electrolytes and trace elements)  Nutritional Goals:  2100-2200 kCal, 110-120 grams of protein per day  Assessment: 27 YOM with an extensive PMH and multiple admissions. He has chronic, end-stage, Crohn's disease and PUD and is being evaluated for small bowel transplant at [facility]. 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 Patient was admitted to [facility] in November 2015 for attempted  ex-lap due to SBO. He has been receiving TPN as outpatient.  2/17- Pt to resume TPN s/p line holiday as a result of fungemia.  PICC placed this AM.  GI:  Not a surgical candidate d/t abundance of scar tissue. Cirrhosis 2nd to MTX vs NASH. Ex-lap was attempted during stay at [facility] in Nov 2015, but unable to gain access to abdomen for 2hr and procedure aborted >> placed decompressive G-tube instead. Prealbumin < 3 indicating very poor nutritional status, now improved to 11.5 (was 13.9 in Jan). NG O/P decreased to 200 mL, drains 2700 mL. On PPI IV  Endo: TSH WNL. DM.  On resistant SSI & requiring minimal insulin while off TPN.  Lytes:   Off TPN since 2/9.  Currently [facility] D5NS + 20K at 39m[facility]hr[facility] k[facility]).  K 3, Ca 7.7 (Corr 8.9).  Mg & Phos wnl on 2/15.  He received K-runs x 4 on 2/16  Renal:  ARF resolved.  Cr 1.04  Pulm: extubated 2/8, now on RA - Duonebs  Cards: BP soft without pressor, now tachycardic, CVP 0 (?) - Lasix (using A-line BP as more accurate)  Hepatobil: AST/ALT decreased to 60/75, alk phos up to 178, tbili up to 5.8. TG normalized (268 >> 85).  Heme:  Hg 8.5 & pltc 129  Neuro: no issues  ID:  Mycamine thru 2/23 for fungemia.  S/P Vanc & Zosyn (2/2 >> 2/9) for HCAP.  2/15 TEE (-)vegetations.  Afebrile, 2/15 WBC WNL  2/2 BCx grew yeast, 2/3 BCx grew C.glabrata and C.parapsilosis,  2/4 BCx still growing yeast,  2/8 BCx NF 2/9 BCx NF 2/10 BCx NF 2/13 BCx ntd  TPN Access: PICC 2/17 >> TPN day#: continued from home.  TPN off 2/9 >> 2.17 for line holiday  Plan:  - Clinimix 5/15-E at 553mhr and IVFE 1072mr -  Daily multivitamin - Trace elements MWF due to elevated tbili and documentation of jaundice - Insulin 15 units in TPN - Decr IVF to 41m57m when TPN hung - TPN labs Mon, ThurDeschutesarmD Clinical Pharmacist [facility]

## 2015-01-30 NOTE — Progress Notes (Signed)
Progress Note  David [name] MLY:[id number] DOB: 04-25-1976 DOA: 01/15/2015 PCP: [name], MD  Admit HPI / Brief Narrative: 39 year old with a very complicated past medical history including previous episodes of aspiration, Crohn's disease, cirrhosis of the liver from fatty infiltration of the liver, and peptic ulcer disease and who came to the hospital with shortness of breath. He went to see his primary care physician and wn as advised to come to the emergency department. He was found to have bilateral pneumonias in the ER.   Significant Events: 2/02 admitted to [facility] 2/03 intubated, transferred to [facility] ICU. Started on ARDS protocol 2/03 Renal consult: CRRT initiated for mgmt of severe acidosis and hyperkalemia 2/03 Gen surg consult: CT abdomen ordered. Noted that pt had difficult surgery @ [facility] 2/04 CTAP: Diffuse abnormal appearance of the bowel with multifocal bowel wall thick in. There is probable pneumatosis involving small bowel in the lower abdomen. No definite perforation. There is increased mesenteric edema and free fluid from prior exam. Decreased hydronephrosis. Splenomegaly. Worsening airspace disease in the lower lungs, left greater thanRight 2/04 ID consult for fungemia: Micafungin added 2/07 Ventilator dyssynchrony with appearance of proximal airway obstruction. Improved with suctioning. Off vasopressors. Agitation difficult to control.  2/08 Passed SBT. Remains agitated. Extubated and tolerating. Weaning vasopressors to off. Persistent fungemia on F/U BCs. ID service recommends echocardiogram and change of indwelling catheters/lines. Uo picking up with furosemide. CRRT continued.  2/09 TTE: The estimated ejection fraction was 65%. Wall motion was normal; there were no regional wall motion abnormalities. Slight dilitation of aortic root. No obvious vegetations seen 2/09 Transfer to SDU ordered 2/11 TRH assumed care  2/15 TEE - no vegetations seen    Assessment/Plan:  Severe  sepsis / Persistent fungemia  Line holiday over. PICC today for resumption of chronic TPN. -Opthal has evaluated and ruled out fungal endophlamitis -blood cultures from 2/13 remain 2/2 NGTD thus far -TEE today w/o evidence of vegetations - therefore per ID cont micafungin through 02/05/15. Home health being arranged. Home tomorrow if can be arranged and otherwise stable  Hypokalemia: replete and monitore   HCAP / Hypoxic respiratory failure/ARDS -extubated 2/8 - has completed a full course of abx tx   Acute renal failure (Cr baseline 0.70 in 11/15) S/p CRRT. resolved  Crohn's disease of small and large bowel.  -Per patient awaiting small bowel and liver transplant at [facility] - pt has been TNA dependent. Resume TPN after PICC in.  Has G tube for decompression, not tube feeds. Tolerating solids  Chronic gastroparesis  Protein calorie malnutrition, severe  Chronic pain: on high dose opiates at home. Requesting continue IV fentanyl for now.   Code Status: FULL Family Communication:  Family at bedside  Disposition Plan: home with home health tomorrow if micafungin can be arranged  Consultants:  ID Nephrology PCCM  Antibiotics: Clinda 2/02  Cefepime 2/02  Vanc 2/02 > 2/09 Pip-tazo 2/02 > 2/09 Micafungin 2/03 >>    LINES / TUBES:  ETT 2/03 > 2/08 R femoral A-line 2/03 > 2/08 R IJ HD cath 2/04 > 2/08 L IJ CVL 2/04 > 2/09  Objective: Blood pressure 114/70, pulse 79, temperature 98.6 F (37 C), temperature source Oral, resp. rate 18, height 5\' 11"  (1.803 m), weight 74.4 kg (164 lb 0.4 oz), SpO2 100 %.  Intake/Output Summary (Last 24 hours) at 01/30/15 1323 Last data filed at 01/30/15 1221  Gross per 24 hour  Intake 2967.17 ml  Output   1725 ml  Net 1242.17  ml   Exam: General: chronically ill appearing. A and o Lungs: Clear to auscultation bilaterally without wheezes or crackles Cardiovascular: Regular rate and rhythm without murmur gallop or rub  Abdomen: PEG insertion  site dressed/dry/clean - soft, bowel sounds positive, no rebound, no ascites, no appreciable mass Extremities: No significant cyanosis, clubbing, or edema bilateral lower extremities  Data Reviewed: Basic Metabolic Panel:  Recent Labs Lab 01/24/15 0318 01/25/15 0326 01/26/15 0310 01/26/15 1911 01/27/15 0250 01/28/15 0251 01/29/15 0335  NA 138  --  134*  --  136 135 136  K 3.5  --  2.9* 3.6 3.1* 3.3* 3.0*  CL 102  --  99  --  105 108 108  CO2 27  --  24  --  26 22 18*  GLUCOSE 146*  --  141*  --  103* 85 125*  BUN 84*  --  54*  --  43* 41* 28*  CREATININE 1.36*  --  1.00  --  1.15 1.06 1.04  CALCIUM 7.2*  --  7.4*  --  8.0* 7.6* 7.7*  MG 2.4 2.3 2.1 2.2 1.9 2.1  --   PHOS  --   --  4.2  --  3.6 3.5  --    Liver Function Tests:  Recent Labs Lab 01/26/15 0310 01/27/15 0250 01/28/15 0251 01/29/15 0335  AST 47* 54* 46* 47*  ALT 56* 61* 62* 58*  ALKPHOS 148* 154* 144* 168*  BILITOT 5.8* 6.0* 4.3* 3.5*  PROT 6.3 5.6* 5.4* 5.3*  ALBUMIN 2.4* 2.7* 2.4* 2.5*   CBC:  Recent Labs Lab 01/24/15 0318 01/25/15 0326 01/26/15 0310 01/27/15 0250 01/28/15 0251  WBC 9.7 4.4 4.0 4.2 8.2  NEUTROABS  --   --  3.1 2.9 6.3  HGB 9.2* 8.1* 8.4* 8.6* 8.5*  HCT 27.6* 25.2* 25.5* 26.6* 25.8*  MCV 76.0* 76.8* 76.8* 77.8* 78.4  PLT 99* 59* 70* 78* 129*   CBG:  Recent Labs Lab 01/29/15 1208 01/29/15 1653 01/29/15 2002 01/30/15 0740 01/30/15 1146  GLUCAP 141* 169* 127* 103* 187*    Recent Results (from the past 240 hour(s))  Culture, blood (routine x 2)     Status: None   Collection Time: 01/21/15 12:57 PM  Result Value Ref Range Status   Specimen Description BLOOD RIGHT ANTECUBITAL  Final   Special Requests BOTTLES DRAWN AEROBIC ONLY 6CC  Final   Culture   Final    NO GROWTH 5 DAYS Performed at [facility]    Report Status 01/27/2015 FINAL  Final  Culture, blood (routine x 2)     Status: None   Collection Time: 01/21/15  2:07 PM  Result Value Ref Range Status     Specimen Description BLOOD RIGHT ARM  Final   Special Requests BOTTLES DRAWN AEROBIC AND ANAEROBIC 5CC  Final   Culture   Final    NO GROWTH 5 DAYS Performed at [facility]    Report Status 01/27/2015 FINAL  Final  Culture, blood (single)     Status: None   Collection Time: 01/22/15  9:44 PM  Result Value Ref Range Status   Specimen Description BLOOD RIGHT ARM  Final   Special Requests   Final    BOTTLES DRAWN AEROBIC AND ANAEROBIC 10CC BLUE 8CC PURPLE   Culture   Final    NO GROWTH 5 DAYS Note: Culture results may be compromised due to an excessive volume of blood received in culture bottles. Performed at [facility]  Report Status 01/29/2015 FINAL  Final  Culture, blood (routine x 2)     Status: None   Collection Time: 01/23/15  9:50 AM  Result Value Ref Range Status   Specimen Description BLOOD LEFT ARM  Final   Special Requests BOTTLES DRAWN AEROBIC ONLY 6CC  Final   Culture   Final    NO GROWTH 5 DAYS Performed at [facility]    Report Status 01/29/2015 FINAL  Final  Culture, blood (routine x 2)     Status: None   Collection Time: 01/23/15 10:00 AM  Result Value Ref Range Status   Specimen Description BLOOD LEFT HAND  Final   Special Requests BOTTLES DRAWN AEROBIC ONLY 5CC  Final   Culture   Final    NO GROWTH 5 DAYS Performed at [facility]    Report Status 01/29/2015 FINAL  Final  Body fluid culture     Status: None   Collection Time: 01/25/15  6:15 AM  Result Value Ref Range Status   Specimen Description GASTRIC  Final   Special Requests Normal  Final   Gram Stain   Final    NO WBC SEEN NO ORGANISMS SEEN Performed at [facility]    Culture   Final    ABUNDANT PSEUDOMONAS AERUGINOSA Performed at [facility]    Report Status 01/28/2015 FINAL  Final   Organism ID, Bacteria PSEUDOMONAS AERUGINOSA  Final      Susceptibility   Pseudomonas aeruginosa - MIC*    CEFEPIME >=64 RESISTANT Resistant      CEFTAZIDIME >=64 RESISTANT Resistant     CIPROFLOXACIN 1 SENSITIVE Sensitive     GENTAMICIN 4 SENSITIVE Sensitive     IMIPENEM >=16 RESISTANT Resistant     TOBRAMYCIN <=1 SENSITIVE Sensitive     * ABUNDANT PSEUDOMONAS AERUGINOSA  Culture, blood (routine x 2)     Status: None (Preliminary result)   Collection Time: 01/26/15  7:07 PM  Result Value Ref Range Status   Specimen Description BLOOD LEFT ARM  Final   Special Requests BOTTLES DRAWN AEROBIC AND ANAEROBIC 5CC  Final   Culture   Final           BLOOD CULTURE RECEIVED NO GROWTH TO DATE CULTURE WILL BE HELD FOR 5 DAYS BEFORE ISSUING A FINAL NEGATIVE REPORT Performed at [facility]    Report Status PENDING  Incomplete  Culture, blood (routine x 2)     Status: None (Preliminary result)   Collection Time: 01/26/15  7:11 PM  Result Value Ref Range Status   Specimen Description BLOOD LEFT HAND  Final   Special Requests BOTTLES DRAWN AEROBIC AND ANAEROBIC 5CC  Final   Culture   Final           BLOOD CULTURE RECEIVED NO GROWTH TO DATE CULTURE WILL BE HELD FOR 5 DAYS BEFORE ISSUING A FINAL NEGATIVE REPORT Performed at [facility]    Report Status PENDING  Incomplete  Wound culture     Status: None (Preliminary result)   Collection Time: 01/27/15  4:53 PM  Result Value Ref Range Status   Specimen Description WOUND ABDOMEN  Final   Special Requests NONE  Final   Gram Stain   Final    MODERATE WBC PRESENT, PREDOMINANTLY PMN NO SQUAMOUS EPITHELIAL CELLS SEEN FEW GRAM NEGATIVE RODS Performed at [facility]    Culture   Final    ABUNDANT ENTEROBACTER CLOACAE ABUNDANT PSEUDOMONAS AERUGINOSA Performed at [facility]  Report Status PENDING  Incomplete   Organism ID, Bacteria ENTEROBACTER CLOACAE  Final      Susceptibility   Enterobacter cloacae - MIC*    CEFAZOLIN >=64 RESISTANT Resistant     CEFEPIME 16 INTERMEDIATE Intermediate     CEFTAZIDIME >=64 RESISTANT Resistant     CEFTRIAXONE >=64  RESISTANT Resistant     CIPROFLOXACIN <=0.25 SENSITIVE Sensitive     GENTAMICIN <=1 SENSITIVE Sensitive     IMIPENEM <=0.25 SENSITIVE Sensitive     PIP/TAZO >=128 RESISTANT Resistant     TOBRAMYCIN <=1 SENSITIVE Sensitive     TRIMETH/SULFA <=20 SENSITIVE Sensitive     * ABUNDANT ENTEROBACTER CLOACAE     Studies:  Recent x-ray studies have been reviewed in detail by the Attending Physician  Scheduled Meds:  Scheduled Meds: . chlorhexidine  15 mL Mouth Rinse BID  . enoxaparin (LOVENOX) injection  40 mg Subcutaneous Q24H  . feeding supplement (RESOURCE BREEZE)  1 Container Oral TID BM  . insulin aspart  0-20 Units Subcutaneous TID WC  . micafungin (MYCAMINE) IV  100 mg Intravenous QHS  . pantoprazole (PROTONIX) IV  40 mg Intravenous QHS  . potassium chloride  10 mEq Intravenous Q1 Hr x 3  . predniSONE  20 mg Oral Q breakfast    Time spent on care of this patient: 15 mins  Crista Curb, MD [facility]       [email or web address]      password [facility]  01/30/2015, 1:23 PM   LOS: 15 days

## 2015-01-30 NOTE — Care Management (Signed)
Medicare IM given. Brocha Gilliam RN BSN  

## 2015-01-31 ENCOUNTER — Encounter (INDEPENDENT_AMBULATORY_CARE_PROVIDER_SITE_OTHER): Payer: Self-pay

## 2015-01-31 DIAGNOSIS — K5090 Crohn's disease, unspecified, without complications: Secondary | ICD-10-CM | POA: Diagnosis not present

## 2015-01-31 LAB — BASIC METABOLIC PANEL
Anion gap: 3 — ABNORMAL LOW (ref 5–15)
BUN: 20 mg/dL (ref 6–23)
CO2: 21 mmol/L (ref 19–32)
CREATININE: 0.75 mg/dL (ref 0.50–1.35)
Calcium: 7.5 mg/dL — ABNORMAL LOW (ref 8.4–10.5)
Chloride: 113 mmol/L — ABNORMAL HIGH (ref 96–112)
Glucose, Bld: 109 mg/dL — ABNORMAL HIGH (ref 70–99)
Potassium: 3.4 mmol/L — ABNORMAL LOW (ref 3.5–5.1)
Sodium: 137 mmol/L (ref 135–145)

## 2015-01-31 LAB — CBC
HEMATOCRIT: 23.5 % — AB (ref 39.0–52.0)
HEMOGLOBIN: 7.5 g/dL — AB (ref 13.0–17.0)
MCH: 25.3 pg — ABNORMAL LOW (ref 26.0–34.0)
MCHC: 31.9 g/dL (ref 30.0–36.0)
MCV: 79.4 fL (ref 78.0–100.0)
Platelets: 81 10*3/uL — ABNORMAL LOW (ref 150–400)
RBC: 2.96 MIL/uL — ABNORMAL LOW (ref 4.22–5.81)
RDW: 20.4 % — ABNORMAL HIGH (ref 11.5–15.5)
WBC: 4.3 10*3/uL (ref 4.0–10.5)

## 2015-01-31 LAB — WOUND CULTURE

## 2015-01-31 LAB — GLUCOSE, CAPILLARY
Glucose-Capillary: 108 mg/dL — ABNORMAL HIGH (ref 70–99)
Glucose-Capillary: 169 mg/dL — ABNORMAL HIGH (ref 70–99)

## 2015-01-31 MED ORDER — HEPARIN SOD (PORK) LOCK FLUSH 100 UNIT/ML IV SOLN
250.0000 [IU] | INTRAVENOUS | Status: AC | PRN
  Administered 2015-01-31: 250 [IU]

## 2015-01-31 MED ORDER — SODIUM CHLORIDE 0.9 % IV SOLN: 50.0000 mg | INTRAVENOUS | Status: DC

## 2015-01-31 MED ORDER — BOOST / RESOURCE BREEZE PO LIQD: 1.0000 | Freq: Three times a day (TID) | ORAL | Status: DC

## 2015-01-31 NOTE — Progress Notes (Signed)
In to see pt for treatment session and he reports that he is to go home today once they decide about adding to his PICC line. He reports that he has been getting up to the bathroom by himself pushing the IV pole and that he is not concerned about BADLs when he returns home--that he has family that can A if needed. Acute OT will sign off. Ignacia Palma, OTR/L [phone] 01/31/2015 \

## 2015-01-31 NOTE — Progress Notes (Signed)
Physical Therapy Treatment Patient Details Name: David [name] MRN: [id number] DOB: [birth year] Today's Date: 01/31/2015    History of Present Illness 39 y.o. M who presented to [facility] ED on 2/2 with 2 days of dyspnea, cough, fevers.  He was found to be hypoxic requiring NRB and CXR suggestive of HCAP/ARDS/ fungemia. Pt with Chron's awaiting small bowel transplant at [facility]    PT Comments    Pt reports he's been walking in hall pushing IV pole. Used cane while PT managed IV pole and did well with no unsteadiness, loss of balance, or drifting Lt/Rt. Agrees he does not need the RW for home and hopes to soon not need the cane. All questions answered with pt reporting no further concerns re: mobility at home.   Follow Up Recommendations  Home health PT;Supervision for mobility/OOB     Equipment Recommendations  None recommended by PT (pt owns cane)    Recommendations for Other Services       Precautions / Restrictions Precautions Precautions: Fall Precaution Comments: peg    Mobility  Bed Mobility Overal bed mobility: Modified Independent             General bed mobility comments: bed flat, no use of rail; incr time  Transfers Overall transfer level: Modified independent Equipment used: Straight cane Transfers: Sit to/from Stand Sit to Stand: Modified independent (Device/Increase time)         General transfer comment: pt has cane at home and has used previously; correct technique  Ambulation/Gait Ambulation/Gait assistance: Min guard;Supervision Ambulation Distance (Feet): 170 Feet Assistive device: Straight cane Gait Pattern/deviations: Step-through pattern;Decreased stride length   Gait velocity interpretation: Below normal speed for age/gender General Gait Details: no unsteadiness noted; pt describes feeling weak and looks forward to being at home and able to get up and move around more    Stairs Stairs:  (deferred due to multiple lines and pt w/o concern)           Wheelchair Mobility    Modified Rankin (Stroke Patients Only)       Balance   Sitting-balance support: No upper extremity supported;Feet supported Sitting balance-Leahy Scale: Good     Standing balance support: No upper extremity supported;During functional activity Standing balance-Leahy Scale: Good Standing balance comment: stood 74mn without UE support while talking on phone                    Cognition Arousal/Alertness: Awake/alert Behavior During Therapy: Flat affect Overall Cognitive Status: Within Functional Limits for tasks assessed                      Exercises      General Comments General comments (skin integrity, edema, etc.): pt reports he has done the bridging exercise "some" since instructed in exercises      Pertinent Vitals/Pain Pain Assessment: No/denies pain    Home Living                      Prior Function            PT Goals (current goals can now be found in the care plan section) Acute Rehab PT Goals Patient Stated Goal: go home Progress towards PT goals: Goals met/education completed, patient discharged from PT    Frequency       PT Plan Equipment recommendations need to be updated    Co-evaluation  End of Session Equipment Utilized During Treatment: Gait belt Activity Tolerance: Patient tolerated treatment well Patient left: in chair;with call bell/phone within reach   Patient is being discharged from PT services secondary to:  Goals met and no further therapy needs identified.  Please see latest Therapy Progress Note for current level of functioning and progress toward goals.  Progress and discharge plan and discussed with patient/caregiver and they  Agree     Time: 1201-1217 PT Time Calculation (min) (ACUTE ONLY): 16 min  Charges:  $Gait Training: 8-22 mins                    G Codes:      Yan Okray 01-Feb-2015, 12:27 PM Pager [phone]

## 2015-01-31 NOTE — Progress Notes (Signed)
AVS discharge instructions were reviewed with patient. Patient was given prescription for resource breeze and caspofungin to take to his pharmacy. Patient stated that he did not have any questions. Staff will assist patient to his transportation.

## 2015-01-31 NOTE — Discharge Summary (Signed)
Physician Discharge Summary  David [name]:[id number] DOB: [birth year] DOA: 01/15/2015  PCP: [name], MD  Admit date: 01/15/2015 Discharge date: 01/31/2015  Time spent: 35 minutes  Recommendations for Outpatient Follow-up:  1. Home health 2. Caspofungin until 2/23  Discharge Diagnoses:  Principal Problem:   Healthcare-associated pneumonia Active Problems:   Crohn's disease of both small and large intestine with fistula   Hypertension   Liver cirrhosis   Protein-calorie malnutrition, severe   Aspiration pneumonia   Anemia of chronic disease   Acute kidney injury   Acute respiratory failure with hypoxia   Thrombocytopenia   Pneumonia   Endotracheally intubated   Septic shock   Acute respiratory failure   ARDS (adult respiratory distress syndrome)   Fungemia   Candida glabrata infection   Candida parapsilosis infection   Candidemia   Hemodialysis catheter infection   Central line infection   Acute respiratory disease   Bilateral pneumonia   Severe sepsis   AKI (acute kidney injury)   Acute renal failure syndrome   Hypokalemia   Discharge Condition: improved  Diet recommendation:   Filed Weights   01/29/15 0500 01/30/15 0527 01/31/15 0525  Weight: 72.8 kg (160 lb 7.9 oz) 74.4 kg (164 lb 0.4 oz) 77.3 kg (170 lb 6.7 oz)    History of present illness:  Pt is encephalopathic; therefore, this HPI is obtained from chart review. David [name] is a 39 y.o. M with PMH as outlined below. He presented to [facility] 2/2 with 2 day hx of dyspnea, cough, fevers. He was found to be hypoxic and required NRB to maintain sats > 90%. CXR suggested HCAP. He was admitted to the SDU and treated with IV abx and supplemental O2. On 2/3, he tried BiPAP but could not tolerate it. He was placed back on HFNC however repeat ABG later worse (7.2 / 38 / 68). In addition, he endorsed worsening SOB and was more tachycardic. Decision was made to intubate and provide mechanical ventilation.  CXR post intubation revealed progression of b/l opacities worrisome for ARDS. PCCM was called for transfer to [facility] for further management due to his multiple co-morbidities.  ED notes state the pt has been battling PNA for the past year due to aspiration.  Of note, he has Crohn's of the small and large bowel and is awaiting small bowel transplantation at [facility]. 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   Hospital Course:  Severe sepsis / Persistent fungemia  Line holiday over. PICC today for resumption of chronic TPN. -Opthal has evaluated and ruled out fungal endophlamitis -blood cultures from 2/13 remain 2/2 NGTD thus far -TEE today w/o evidence of vegetations - therefore per ID cont micafungin through 02/05/15. Home health   Hypokalemia: repleted  HCAP / Hypoxic respiratory failure/ARDS -extubated 2/8 - has completed a full course of abx tx   Acute renal failure (Cr baseline 0.70 in 11/15) S/p CRRT. resolved  Crohn's disease of small and large bowel.  -Per patient awaiting small bowel and liver transplant at [facility] - pt has been TNA dependent. Resume TPN after PICC in. Has G tube for decompression, not tube feeds. Tolerating solids  Chronic gastroparesis  Protein calorie malnutrition, severe  Chronic pain: on high dose opiates at home.   Procedures:  TEE  PICC  Consultations:  ID  Discharge Exam: Filed Vitals:   01/31/15 0459  BP: 102/67  Pulse: 77  Temp: 98.1 F (36.7 C)  Resp: 16    General: A+Ox3, NAD   Discharge Instructions   Discharge Instructions    Discharge instructions    Complete by:  As directed   Home health Continue TPN Caspofungin until 2/23     Increase activity slowly    Complete by:  As directed           Current Discharge Medication List    START taking these medications   Details  caspofungin  50 mg in sodium chloride 0.9 % 250 mL Inject 50 mg into the vein daily. Qty: 5 Dose, Refills: 0    feeding supplement, RESOURCE BREEZE, (RESOURCE BREEZE) LIQD Take 1 Container by mouth 3 (three) times daily between meals. Qty: 30 Container, Refills: 0      CONTINUE these medications which have NOT CHANGED   Details  calcium-vitamin D (OSCAL 500/200 D-3) 500-200 MG-UNIT per tablet Take 1 tablet by mouth 2 (two) times daily.   Associated Diagnoses: Osteoporosis    dicyclomine (BENTYL) 10 MG capsule Take 1 capsule (10 mg total) by mouth 3 (three) times daily as needed (stomach cramps). Qty: 90 capsule, Refills: 5   Associated Diagnoses: Right lower quadrant abdominal pain    ferrous gluconate (FERGON) 324 MG tablet Take 324 mg by mouth 3 (three) times daily.    fish oil-omega-3 fatty acids 1000 MG capsule Take 1 g by mouth 3 (three) times daily.    metolazone (ZAROXOLYN) 5 MG tablet Take 1 tablet (5 mg total) by mouth 2 (two) times daily. Qty: 30 tablet, Refills: 0    oxyCODONE (ROXICODONE INTENSOL) 20 MG/ML concentrated solution Take 2.5 mLs (50 mg total) by mouth 4 (four) times daily. Qty: 300 mL, Refills: 0    pantoprazole (PROTONIX) 40 MG tablet Take 1 tablet (40 mg total) by mouth 2 (two) times daily. Qty: 60 tablet, Refills: 5   Associated Diagnoses: Gastroesophageal reflux disease without esophagitis    potassium chloride 20 MEQ/15ML (10%) SOLN Take 30 mLs (40 mEq total) by mouth 3 (three) times daily. Qty: 900 mL, Refills: 0    predniSONE (DELTASONE) 20 MG tablet Take 1 tablet (20 mg total) by mouth daily with breakfast. Qty: 30 tablet, Refills: 0    Probiotic Product (ALIGN) 4 MG CAPS Take 4 mg by mouth daily.     promethazine (PHENERGAN) 25 MG tablet Take 1 tablet (25 mg total) by mouth 2 (two) times daily as needed. nausea Qty: 30 tablet, Refills: 1   Associated Diagnoses: Nausea and vomiting, vomiting of unspecified type    spironolactone (ALDACTONE) 25 MG tablet  Take 1 tablet (25 mg total) by mouth 2 (two) times daily. Qty: 60 tablet, Refills: 1    sucralfate (CARAFATE) 1 G tablet Take 1 g by mouth 3 (three) times daily before meals.    polyethylene glycol (MIRALAX / GLYCOLAX) packet Take 17 g by mouth daily as needed for mild constipation or moderate constipation.        Allergies  Allergen Reactions  . Remicade [Infliximab]     Dr Karilyn Cota states previous respiratory arrest not related to remicade. Dr Karilyn Cota states remicade not a drug related allergy. 07-07-2013 at 1025 rapid response called to PACU- Patient difficulty breathing after infusion of Remicade infusing. Do NOT Give Remicade!  . Fentanyl And Related Itching    Swelling, Redness Tolerated fentanyl 01/2015  . Alfentanil Itching    Swelling, Redness  . Wellbutrin [Bupropion] Nausea And Vomiting  .  Morphine And Related Hives   Follow-up Information    Follow up with [name], MD In 1 week.   Specialty:  Family Medicine   Contact information:   [address] [place] [address] [phone]       Follow up with Acey Lav, MD In 2 weeks.   Specialty:  Infectious Diseases   Contact information:   [address] [place] [address] [phone]        The results of significant diagnostics from this hospitalization (including imaging, microbiology, ancillary and laboratory) are listed below for reference.    Significant Diagnostic Studies: Ct Abdomen Pelvis Wo Contrast  01/17/2015   CLINICAL DATA:  Septic shock. History of Crohn's disease. Worsening acidosis and leukocytosis. Renal failure.  EXAM: CT ABDOMEN AND PELVIS WITHOUT CONTRAST  TECHNIQUE: Multidetector CT imaging of the abdomen and pelvis was performed following the standard protocol without IV contrast.  COMPARISON:  Most recent CT 12/30/2014  FINDINGS: Bilateral airspace disease in the lower lobes, most significant in the left lower lobe with combination of  ground-glass and confluent opacities.  Gastrostomy tube in the stomach. Tip of the enteric tube in the stomach. There is abnormal appearance of small bowel in the lower abdomen. There is diffuse small bowel thickening, mesenteric edema and mesenteric free fluid. These decreased bowel distention compared to prior. There is questionable pneumatosis involving small bowel loops in the central abdomen, axial image number 55/114. There is diffuse colonic wall thickening involving the splenic flexure through the sigmoid colon that has likely progressed from prior exam. There is diffuse colonic wall thickening of the ascending colon that is also progressed. No definite perforation or free air.  The spleen is enlarged measuring 20 cm in greatest dimension with a volume of 2025 mL. There is enlargement of the left hepatic lobe. Periportal edema is again seen. Clips in the gallbladder fossa from cholecystectomy. Pancreas is grossly unremarkable. Adrenal glands are normal. Cystic lesion adjacent of the superior right kidney abutting the right lower liver, unchanged. There is decreased hydronephrosis from prior.  Mesenteric edema and free fluid, with ascites in the pelvis. The bladder is decompressed by Foley catheter. There is diffuse whole body wall edema consistent with third spacing.  No acute osseous abnormality.  IMPRESSION: 1. Diffuse abnormal appearance of the bowel with multifocal bowel wall thick in. There is probable pneumatosis involving small bowel in the lower abdomen. No definite perforation. There is increased mesenteric edema and free fluid from prior exam. 2. Decreased hydronephrosis. 3. Splenomegaly. 4. Worsening airspace disease in the lower lungs, left greater than right.  These results of pneumatosis were called by telephone at the time of interpretation on 01/17/2015 at 3:38 am to Dr. Craige Cotta, who verbally acknowledged these results.   Electronically Signed   By: Rubye Oaks M.D.   On: 01/17/2015 03:39    Dg Chest 2 View  01/15/2015   CLINICAL DATA:  Pneumonia  EXAM: CHEST  2 VIEW  COMPARISON:  12/26/2014  FINDINGS: Right upper extremity PICC, tip at the distal SVC.  Chronically low lung volumes with reticular nodular densities at the right base. There is new patchy airspace opacity in the bilateral anterior lungs. No edema, effusion, or pneumothorax. Normal heart size and mediastinal contours.  IMPRESSION: Bilateral pneumonia.   Electronically Signed   By: Tiburcio Pea M.D.   On: 01/15/2015 14:39   Dg Chest Port 1 View  01/22/2015   CLINICAL DATA:  Respiratory failure.  EXAM: PORTABLE CHEST - 1 VIEW  COMPARISON:  01/20/2015.  FINDINGS: Interim removal of endotracheal tube, NG tube, and right IJ sheath. Left IJ line in stable position. Mediastinum and hilar structures are normal. Heart size normal. Interim near complete clearing of bilateral pulmonary infiltrates with mild residual in right upper lobe. Stable elevation left hemidiaphragm. No pleural effusion or pneumothorax identified.  IMPRESSION: 1. Interim removal of tracheostomy tube, NG tube, right IJ sheath. Left IJ line in stable position. 2. Interim near-complete clearing of bilateral pulmonary infiltrates with mild residual right upper lobe. Stable elevation left hemidiaphragm.   Electronically Signed   By: Maisie Fus  Register   On: 01/22/2015 07:40   Dg Chest Port 1 View  01/20/2015   CLINICAL DATA:  Subsequent encounter for respiratory failure. Diabetes. Pneumonia. Shortness of breath.  EXAM: PORTABLE CHEST - 1 VIEW  COMPARISON:  One day prior  FINDINGS: Endotracheal tube terminates 2.7 cm above carina. Left internal jugular line tip high SVC. Right internal jugular line tip mid SVC.  Nasogastric tube extends beyond the  inferior aspect of the film.  Patient rotated right. Borderline cardiomegaly. No pleural effusion or pneumothorax. Slightly improved aeration. Patchy multifocal airspace disease remains. Most confluent in the right upper and left  lower lungs.  IMPRESSION: Improved aeration with multi focal infection and/or pulmonary edema remaining.   Electronically Signed   By: Jeronimo Greaves M.D.   On: 01/20/2015 09:54   Dg Chest Port 1 View  01/19/2015   CLINICAL DATA:  Subsequent evaluation for acute respiratory failure  EXAM: PORTABLE CHEST - 1 VIEW  COMPARISON:  01/18/2015  FINDINGS: Stable cardiac enlargement. Stable right upper lobe and diffuse left lung opacity. Right and left central lines, orogastric tube, and endotracheal tube all stable.  IMPRESSION: Bilateral opacities stable.   Electronically Signed   By: Esperanza Heir M.D.   On: 01/19/2015 07:14   Dg Chest Port 1 View  01/18/2015   CLINICAL DATA:  Acute respiratory disease, shortness of breath  EXAM: PORTABLE CHEST - 1 VIEW  COMPARISON:  01/18/2015  FINDINGS: Patchy bilateral upper lobe predominant/perihilar opacities, unchanged, suspicious for pneumonia or interstitial edema, unchanged. No pleural effusion or pneumothorax.  Endotracheal tube terminates 3 cm above the carina.  Cardiomegaly.  Right IJ venous catheter terminates in the lower SVC. Left IJ venous catheter terminates in the lower SVC.  Enteric tube courses into the stomach.  IMPRESSION: Stable patchy bilateral pulmonary opacities, suspicious for multifocal pneumonia or interstitial edema.  Endotracheal tube terminates 3 cm above the carina.  Additional support apparatus as above.   Electronically Signed   By: Charline Bills M.D.   On: 01/18/2015 17:58   Dg Chest Port 1 View  01/18/2015   CLINICAL DATA:  39 year old male with sepsis and ARDS. Initial encounter.  EXAM: PORTABLE CHEST - 1 VIEW  COMPARISON:  01/17/2015 and earlier.  FINDINGS: Portable AP semi upright view at 0543 hrs. Endotracheal tube tip is slightly lower, within normal limits. Stable left IJ central line. Stable visible enteric tube. Stable right IJ dual lumen catheter.  Upper lobe predominant hazy and confluent pulmonary opacity. Stable lung volumes.  Stable cardiac size and mediastinal contours. No pneumothorax. No pleural effusion identified. Overall ventilation has not significantly changed since 01/16/2015.  IMPRESSION: 1.  Stable lines and tubes. 2. Stable ventilation with upper lobe predominant hazy and confluent opacity.   Electronically Signed   By: Augusto Gamble M.D.   On: 01/18/2015 07:10   Dg  Chest Port 1 View  01/17/2015   CLINICAL DATA:  Increased density at diffuse airspace disease, likely from  EXAM: PORTABLE CHEST - 1 VIEW  COMPARISON:  Chest x-ray from earlier today  FINDINGS: Stable positioning of endotracheal and orogastric tubes. There is a new left IJ central line, tip at the upper SVC. No complicating pneumothorax. The right upper extremity PICC is in stable position, tip at the upper cavoatrial junction. Dialysis or pheresis catheter via right IJ approach is in stable position.  Lower lung volumes, which likely accounts for the diffuse increased density of bilateral airspace disease, asymmetric to the left. No increasing pleural fluid.  Stable, normal heart size.  IMPRESSION: 1. New left IJ catheter is in good position.  No pneumothorax. 2. Diffuse pneumonia and/or noncardiogenic edema. Interval increase in density is likely from lower lung volumes.   Electronically Signed   By: Tiburcio Pea M.D.   On: 01/17/2015 11:40   Dg Chest Port 1 View  01/17/2015   CLINICAL DATA:  History of aspiration pneumonia, septic shock. , hepatic cirrhosis, respiratory failure.  EXAM: PORTABLE CHEST - 1 VIEW  COMPARISON:  Portable chest x-ray of January 17, 2015 at 1:24 a.m.  FINDINGS: There is persistent confluent bilateral airspace disease not greatly changed from the earlier study. There is relative sparing of the lung bases. The cardiopericardial silhouette is top-normal in size. The pulmonary vascularity is prominent centrally but stable. The endotracheal tube tip lies 4.3 cm above the crotch of the carina. The esophagogastric tube tip projects below  the inferior margin of the image. The large caliber right internal jugular catheter tip projects over the proximal portion of the SVC. The right-sided PICC line tip projects over the midportion of the SVC.  IMPRESSION: Stable appearance of the chest with confluent bilateral alveolar opacities consistent with pneumonia, edema, or other alveolar filling process. The support tubes and lines are in appropriate position radiographically.   Electronically Signed   By: David  [place]   On: 01/17/2015 07:58   Dg Chest Port 1 View  01/17/2015   CLINICAL DATA:  Hemodialysis catheter placement.  EXAM: PORTABLE CHEST - 1 VIEW  COMPARISON:  One day prior at 1854 hr  FINDINGS: AP view at 0124 hr: Right dialysis catheter in the mid -distal SVC. There is no pneumothorax. Right upper extremity PICC tip remains in the distal SVC. Endotracheal tube of the thoracic inlet. Enteric tube in place, tip and side port below the diaphragm. The heart size is normal. Bilateral airspace disease, left greater than right, slightly worsened from prior. Question blunting of left costophrenic angle.  IMPRESSION: 1. Tip of the dialysis catheter in the mid SVC.  No pneumothorax. 2. Worsening bilateral airspace disease, pneumonia versus edema.   Electronically Signed   By: Rubye Oaks M.D.   On: 01/17/2015 02:48   Dg Chest Port 1 View  01/16/2015   CLINICAL DATA:  Encounter for intubation  EXAM: PORTABLE CHEST - 1 VIEW  COMPARISON:  01/16/2015  FINDINGS: Endotracheal tube tip is at the clavicular heads. A gastric suction tube enters the stomach. Stable right upper extremity PICC, tip at the upper cavoatrial junction.  Heart size and aortic contours remain normal. Bilateral airspace disease, asymmetric to the left, is unchanged. No increasing pleural fluid or air leak.  IMPRESSION: 1. Stable positioning of tubes and central line. 2. Unchanged bilateral pneumonia and/or noncardiogenic edema.   Electronically Signed   By: Tiburcio Pea M.D.   On:  01/16/2015 19:14  Dg Chest Port 1 View  01/16/2015   CLINICAL DATA:  Respiratory failure  EXAM: PORTABLE CHEST - 1 VIEW  COMPARISON:  January 16, 2015 study obtained earlier in the day  FINDINGS: Endotracheal tube tip is 12.3 cm above the carina. Nasogastric tube tip and side port are in the stomach. Central catheter tip is in the superior vena cava just superior to the cavoatrial junction. No pneumothorax. There is widespread interstitial and patchy alveolar edema bilaterally, essentially stable. Heart is mildly enlarged with pulmonary venous hypertension. No adenopathy.  IMPRESSION: Tube and catheter positions as described without pneumothorax. Note that the endotracheal tube tip is 12.3 cm above the carina. The appearance is consistent with congestive heart failure, likely with superimposed ARDS. The appearance of the lungs and cardiac silhouette is essentially stable compared to earlier in the day.  Critical Value/emergent results were called by telephone at the time of interpretation on 01/16/2015 at 5:30 pm to Armida Sans, RN, who verbally acknowledged these results.   Electronically Signed   By: Bretta Bang M.D.   On: 01/16/2015 17:30   Portable Chest Xray  01/16/2015   CLINICAL DATA:  Endotracheal tube placement.  EXAM: PORTABLE CHEST - 1 VIEW  COMPARISON:  Earlier the same date and 01/15/2015.  FINDINGS: 1222 hr. Endotracheal tube tip is just below the thoracic inlet, approximately 8 cm above the carina. Nasogastric tube projects below the diaphragm, tip not visualized. Right arm PICC appears unchanged near the SVC right atrial junction. There has been further worsening of the left greater than right bilateral airspace opacities with associated air bronchograms. There is no significant pleural effusion. The heart size and mediastinal contours are stable.  IMPRESSION: Endotracheal tube tip is just below the thoracic inlet. This could be advanced approximately 4 cm for more optimal positioning.  Progression of bilateral airspace opacities worrisome for ARDS.   Electronically Signed   By: Roxy Horseman M.D.   On: 01/16/2015 12:35   Dg Chest Port 1 View  01/16/2015   CLINICAL DATA:  Pneumonia.  EXAM: PORTABLE CHEST - 1 VIEW  COMPARISON:  01/15/2015 and 12/26/2014  FINDINGS: There has been marked progression of bilateral pulmonary infiltrates particularly in the left upper lobe. PICC appears in good position. Heart size and vascularity are normal. I suspect there are small bilateral pleural effusions.  IMPRESSION: Marked progression of extensive bilateral pulmonary infiltrates. Probable new effusions.   Electronically Signed   By: Geanie Cooley M.D.   On: 01/16/2015 11:16   Dg Chest Port 1v Same Day  01/16/2015   CLINICAL DATA:  Endotracheal tube repositioning.  Initial encounter.  EXAM: PORTABLE CHEST - 1 VIEW SAME DAY  COMPARISON:  Earlier the same day.  FINDINGS: 1258 hr. The endotracheal tube partially overlaps the nasogastric tube and is not optimally visualized, although appears to extend to approximately 3.1 cm above the carina. The right arm PICC and nasogastric tube are unchanged. Left greater than right airspace opacities have not significantly progressed. There is no evidence of pneumothorax.  IMPRESSION: Apparent repositioning of the endotracheal tube in the mid trachea (not well seen). No significant change in bilateral airspace opacities.   Electronically Signed   By: Roxy Horseman M.D.   On: 01/16/2015 13:38   Dg Abd Portable 1v  01/16/2015   CLINICAL DATA:  39 year old male with 1 day history of abdominal pain and distension  EXAM: PORTABLE ABDOMEN - 1 VIEW  COMPARISON:  Prior CT abdomen/pelvis 12/30/2014 ; prior acute abdominal series 113 2016  FINDINGS: Percutaneous gastrostomy tube projects over the right upper quadrant in similar position compared to prior imaging. Additionally, a nasogastric tube appears present in the more proximal aspect of the stomach. The bowel gas pattern is not  obstructed on this single view. No large free air although this is a supine study. A femoral vascular catheter projects over the right hip.  IMPRESSION: 1. Nonobstructed bowel gas pattern. 2. Nasogastric tube and percutaneous gastrostomy tubes project over the stomach.   Electronically Signed   By: Malachy Moan M.D.   On: 01/16/2015 20:52    Microbiology: Recent Results (from the past 240 hour(s))  Culture, blood (routine x 2)     Status: None   Collection Time: 01/21/15 12:57 PM  Result Value Ref Range Status   Specimen Description BLOOD RIGHT ANTECUBITAL  Final   Special Requests BOTTLES DRAWN AEROBIC ONLY 6CC  Final   Culture   Final    NO GROWTH 5 DAYS Performed at [facility]    Report Status 01/27/2015 FINAL  Final  Culture, blood (routine x 2)     Status: None   Collection Time: 01/21/15  2:07 PM  Result Value Ref Range Status   Specimen Description BLOOD RIGHT ARM  Final   Special Requests BOTTLES DRAWN AEROBIC AND ANAEROBIC 5CC  Final   Culture   Final    NO GROWTH 5 DAYS Performed at [facility]    Report Status 01/27/2015 FINAL  Final  Culture, blood (single)     Status: None   Collection Time: 01/22/15  9:44 PM  Result Value Ref Range Status   Specimen Description BLOOD RIGHT ARM  Final   Special Requests   Final    BOTTLES DRAWN AEROBIC AND ANAEROBIC 10CC BLUE 8CC PURPLE   Culture   Final    NO GROWTH 5 DAYS Note: Culture results may be compromised due to an excessive volume of blood received in culture bottles. Performed at [facility]    Report Status 01/29/2015 FINAL  Final  Culture, blood (routine x 2)     Status: None   Collection Time: 01/23/15  9:50 AM  Result Value Ref Range Status   Specimen Description BLOOD LEFT ARM  Final   Special Requests BOTTLES DRAWN AEROBIC ONLY 6CC  Final   Culture   Final    NO GROWTH 5 DAYS Performed at [facility]    Report Status 01/29/2015 FINAL  Final  Culture, blood (routine  x 2)     Status: None   Collection Time: 01/23/15 10:00 AM  Result Value Ref Range Status   Specimen Description BLOOD LEFT HAND  Final   Special Requests BOTTLES DRAWN AEROBIC ONLY 5CC  Final   Culture   Final    NO GROWTH 5 DAYS Performed at [facility]    Report Status 01/29/2015 FINAL  Final  Body fluid culture     Status: None   Collection Time: 01/25/15  6:15 AM  Result Value Ref Range Status   Specimen Description GASTRIC  Final   Special Requests Normal  Final   Gram Stain   Final    NO WBC SEEN NO ORGANISMS SEEN Performed at [facility]    Culture   Final    ABUNDANT PSEUDOMONAS AERUGINOSA Performed at [facility]    Report Status 01/28/2015 FINAL  Final   Organism ID, Bacteria PSEUDOMONAS AERUGINOSA  Final      Susceptibility   Pseudomonas aeruginosa -  MIC*    CEFEPIME >=64 RESISTANT Resistant     CEFTAZIDIME >=64 RESISTANT Resistant     CIPROFLOXACIN 1 SENSITIVE Sensitive     GENTAMICIN 4 SENSITIVE Sensitive     IMIPENEM >=16 RESISTANT Resistant     TOBRAMYCIN <=1 SENSITIVE Sensitive     * ABUNDANT PSEUDOMONAS AERUGINOSA  Culture, blood (routine x 2)     Status: None (Preliminary result)   Collection Time: 01/26/15  7:07 PM  Result Value Ref Range Status   Specimen Description BLOOD LEFT ARM  Final   Special Requests BOTTLES DRAWN AEROBIC AND ANAEROBIC 5CC  Final   Culture   Final           BLOOD CULTURE RECEIVED NO GROWTH TO DATE CULTURE WILL BE HELD FOR 5 DAYS BEFORE ISSUING A FINAL NEGATIVE REPORT Performed at [facility]    Report Status PENDING  Incomplete  Culture, blood (routine x 2)     Status: None (Preliminary result)   Collection Time: 01/26/15  7:11 PM  Result Value Ref Range Status   Specimen Description BLOOD LEFT HAND  Final   Special Requests BOTTLES DRAWN AEROBIC AND ANAEROBIC 5CC  Final   Culture   Final           BLOOD CULTURE RECEIVED NO GROWTH TO DATE CULTURE WILL BE HELD FOR 5 DAYS BEFORE ISSUING A  FINAL NEGATIVE REPORT Performed at [facility]    Report Status PENDING  Incomplete  Wound culture     Status: None   Collection Time: 01/27/15  4:53 PM  Result Value Ref Range Status   Specimen Description WOUND ABDOMEN  Final   Special Requests NONE  Final   Gram Stain   Final    MODERATE WBC PRESENT, PREDOMINANTLY PMN NO SQUAMOUS EPITHELIAL CELLS SEEN FEW GRAM NEGATIVE RODS Performed at [facility]    Culture   Final    ABUNDANT ENTEROBACTER CLOACAE ABUNDANT PSEUDOMONAS AERUGINOSA Performed at [facility]    Report Status 01/31/2015 FINAL  Final   Organism ID, Bacteria ENTEROBACTER CLOACAE  Final   Organism ID, Bacteria PSEUDOMONAS AERUGINOSA  Final      Susceptibility   Enterobacter cloacae - MIC*    CEFAZOLIN >=64 RESISTANT Resistant     CEFEPIME 16 INTERMEDIATE Intermediate     CEFTAZIDIME >=64 RESISTANT Resistant     CEFTRIAXONE >=64 RESISTANT Resistant     CIPROFLOXACIN <=0.25 SENSITIVE Sensitive     GENTAMICIN <=1 SENSITIVE Sensitive     IMIPENEM <=0.25 SENSITIVE Sensitive     PIP/TAZO >=128 RESISTANT Resistant     TOBRAMYCIN <=1 SENSITIVE Sensitive     TRIMETH/SULFA <=20 SENSITIVE Sensitive     * ABUNDANT ENTEROBACTER CLOACAE   Pseudomonas aeruginosa - MIC*    CEFEPIME >=64 RESISTANT Resistant     CEFTAZIDIME >=64 RESISTANT Resistant     CIPROFLOXACIN 1 SENSITIVE Sensitive     GENTAMICIN 4 SENSITIVE Sensitive     IMIPENEM 8 INTERMEDIATE Intermediate     TOBRAMYCIN <=1 SENSITIVE Sensitive     * ABUNDANT PSEUDOMONAS AERUGINOSA     Labs: Basic Metabolic Panel:  Recent Labs Lab 01/25/15 0326  01/26/15 0310 01/26/15 1911 01/27/15 0250 01/28/15 0251 01/29/15 0335 01/31/15 0540  NA  --   --  134*  --  136 135 136 137  K  --   < > 2.9* 3.6 3.1* 3.3* 3.0* 3.4*  CL  --   --  99  --  105 108 108  113*  CO2  --   --  24  --  26 22 18* 21  GLUCOSE  --   --  141*  --  103* 85 125* 109*  BUN  --   --  54*  --  43* 41* 28* 20   CREATININE  --   --  1.00  --  1.15 1.06 1.04 0.75  CALCIUM  --   --  7.4*  --  8.0* 7.6* 7.7* 7.5*  MG 2.3  --  2.1 2.2 1.9 2.1  --   --   PHOS  --   --  4.2  --  3.6 3.5  --   --   < > = values in this interval not displayed. Liver Function Tests:  Recent Labs Lab 01/26/15 0310 01/27/15 0250 01/28/15 0251 01/29/15 0335  AST 47* 54* 46* 47*  ALT 56* 61* 62* 58*  ALKPHOS 148* 154* 144* 168*  BILITOT 5.8* 6.0* 4.3* 3.5*  PROT 6.3 5.6* 5.4* 5.3*  ALBUMIN 2.4* 2.7* 2.4* 2.5*   No results for input(s): LIPASE, AMYLASE in the last 168 hours. No results for input(s): AMMONIA in the last 168 hours. CBC:  Recent Labs Lab 01/25/15 0326 01/26/15 0310 01/27/15 0250 01/28/15 0251 01/31/15 0540  WBC 4.4 4.0 4.2 8.2 4.3  NEUTROABS  --  3.1 2.9 6.3  --   HGB 8.1* 8.4* 8.6* 8.5* 7.5*  HCT 25.2* 25.5* 26.6* 25.8* 23.5*  MCV 76.8* 76.8* 77.8* 78.4 79.4  PLT 59* 70* 78* 129* 81*   Cardiac Enzymes: No results for input(s): CKTOTAL, CKMB, CKMBINDEX, TROPONINI in the last 168 hours. BNP: BNP (last 3 results) No results for input(s): BNP in the last 8760 hours.  ProBNP (last 3 results) No results for input(s): PROBNP in the last 8760 hours.  CBG:  Recent Labs Lab 01/30/15 1146 01/30/15 1702 01/30/15 2208 01/31/15 0723 01/31/15 1147  GLUCAP 187* 150* 147* 108* 169*       Signed:  Shunda Rabadi  [facility] 01/31/2015, 12:47 PM

## 2015-02-01 ENCOUNTER — Encounter (HOSPITAL_COMMUNITY): Payer: Self-pay | Admitting: Cardiovascular Disease

## 2015-02-01 DIAGNOSIS — K50813 Crohn's disease of both small and large intestine with fistula: Secondary | ICD-10-CM | POA: Diagnosis not present

## 2015-02-01 DIAGNOSIS — J9600 Acute respiratory failure, unspecified whether with hypoxia or hypercapnia: Secondary | ICD-10-CM | POA: Diagnosis not present

## 2015-02-01 DIAGNOSIS — I10 Essential (primary) hypertension: Secondary | ICD-10-CM | POA: Diagnosis not present

## 2015-02-01 DIAGNOSIS — K269 Duodenal ulcer, unspecified as acute or chronic, without hemorrhage or perforation: Secondary | ICD-10-CM | POA: Diagnosis not present

## 2015-02-01 DIAGNOSIS — K769 Liver disease, unspecified: Secondary | ICD-10-CM | POA: Diagnosis not present

## 2015-02-02 LAB — CULTURE, BLOOD (ROUTINE X 2): CULTURE: NO GROWTH

## 2015-02-03 ENCOUNTER — Encounter (HOSPITAL_COMMUNITY)
Admission: RE | Admit: 2015-02-03 | Discharge: 2015-02-03 | Disposition: A | Discharge status: Home / Self Care | Payer: Medicare Other | Source: Ambulatory Visit | Attending: Family Medicine | Admitting: Family Medicine

## 2015-02-03 DIAGNOSIS — Z01818 Encounter for other preprocedural examination: Secondary | ICD-10-CM | POA: Insufficient documentation

## 2015-02-03 DIAGNOSIS — K5090 Crohn's disease, unspecified, without complications: Secondary | ICD-10-CM | POA: Diagnosis not present

## 2015-02-03 DIAGNOSIS — Z5181 Encounter for therapeutic drug level monitoring: Secondary | ICD-10-CM | POA: Diagnosis not present

## 2015-02-03 LAB — CULTURE, BLOOD (ROUTINE X 2): Culture: NO GROWTH

## 2015-02-05 ENCOUNTER — Telehealth (INDEPENDENT_AMBULATORY_CARE_PROVIDER_SITE_OTHER): Payer: Self-pay | Admitting: *Deleted

## 2015-02-05 DIAGNOSIS — K7469 Other cirrhosis of liver: Secondary | ICD-10-CM | POA: Diagnosis not present

## 2015-02-05 NOTE — Telephone Encounter (Signed)
He will not need follow-up chest film

## 2015-02-05 NOTE — Telephone Encounter (Signed)
noted 

## 2015-02-05 NOTE — Telephone Encounter (Signed)
Patient is on my recall for a 6 month chest xray -- it appears he has had some while being inpatient, does he still need -- please advise, thans

## 2015-02-07 ENCOUNTER — Telehealth (INDEPENDENT_AMBULATORY_CARE_PROVIDER_SITE_OTHER): Payer: Self-pay | Admitting: *Deleted

## 2015-02-07 ENCOUNTER — Encounter (INDEPENDENT_AMBULATORY_CARE_PROVIDER_SITE_OTHER): Payer: Self-pay

## 2015-02-07 DIAGNOSIS — K269 Duodenal ulcer, unspecified as acute or chronic, without hemorrhage or perforation: Secondary | ICD-10-CM | POA: Diagnosis not present

## 2015-02-07 DIAGNOSIS — K769 Liver disease, unspecified: Secondary | ICD-10-CM | POA: Diagnosis not present

## 2015-02-07 DIAGNOSIS — I10 Essential (primary) hypertension: Secondary | ICD-10-CM | POA: Diagnosis not present

## 2015-02-07 DIAGNOSIS — K50813 Crohn's disease of both small and large intestine with fistula: Secondary | ICD-10-CM | POA: Diagnosis not present

## 2015-02-07 DIAGNOSIS — J9600 Acute respiratory failure, unspecified whether with hypoxia or hypercapnia: Secondary | ICD-10-CM | POA: Diagnosis not present

## 2015-02-07 NOTE — Telephone Encounter (Signed)
David [name] has been confused, slow at doing things, can look at you but straight through you at the same time. He knows you but seems a little disorientated at times. [name] wasn't sure if his ammonia levels or sugar could be off. [name] was advised to make sure she tell the [facility] Nurse so the PCP could be made aware also. Her return phone number is [phone].

## 2015-02-07 NOTE — Telephone Encounter (Signed)
Talk with patient's wife David [name] he is feeling better Device that RN from [facility] page me tomorrow

## 2015-02-08 DIAGNOSIS — K740 Hepatic fibrosis: Secondary | ICD-10-CM | POA: Diagnosis not present

## 2015-02-08 DIAGNOSIS — K912 Postsurgical malabsorption, not elsewhere classified: Secondary | ICD-10-CM | POA: Diagnosis not present

## 2015-02-08 DIAGNOSIS — K269 Duodenal ulcer, unspecified as acute or chronic, without hemorrhage or perforation: Secondary | ICD-10-CM | POA: Diagnosis not present

## 2015-02-08 DIAGNOSIS — K766 Portal hypertension: Secondary | ICD-10-CM | POA: Diagnosis not present

## 2015-02-08 DIAGNOSIS — K50012 Crohn's disease of small intestine with intestinal obstruction: Secondary | ICD-10-CM | POA: Diagnosis not present

## 2015-02-08 DIAGNOSIS — K7460 Unspecified cirrhosis of liver: Secondary | ICD-10-CM | POA: Diagnosis not present

## 2015-02-08 DIAGNOSIS — J9600 Acute respiratory failure, unspecified whether with hypoxia or hypercapnia: Secondary | ICD-10-CM | POA: Diagnosis not present

## 2015-02-08 DIAGNOSIS — R7989 Other specified abnormal findings of blood chemistry: Secondary | ICD-10-CM | POA: Diagnosis not present

## 2015-02-08 DIAGNOSIS — K7581 Nonalcoholic steatohepatitis (NASH): Secondary | ICD-10-CM | POA: Diagnosis not present

## 2015-02-10 ENCOUNTER — Encounter (HOSPITAL_COMMUNITY)
Admission: RE | Admit: 2015-02-10 | Discharge: 2015-02-10 | Disposition: A | Discharge status: Home / Self Care | Payer: Medicare Other | Source: Skilled Nursing Facility | Attending: Internal Medicine | Admitting: Internal Medicine

## 2015-02-10 DIAGNOSIS — J9600 Acute respiratory failure, unspecified whether with hypoxia or hypercapnia: Secondary | ICD-10-CM | POA: Diagnosis not present

## 2015-02-10 DIAGNOSIS — K769 Liver disease, unspecified: Secondary | ICD-10-CM | POA: Diagnosis not present

## 2015-02-10 DIAGNOSIS — I10 Essential (primary) hypertension: Secondary | ICD-10-CM | POA: Diagnosis not present

## 2015-02-10 DIAGNOSIS — Z01818 Encounter for other preprocedural examination: Secondary | ICD-10-CM | POA: Diagnosis not present

## 2015-02-10 DIAGNOSIS — K269 Duodenal ulcer, unspecified as acute or chronic, without hemorrhage or perforation: Secondary | ICD-10-CM | POA: Diagnosis not present

## 2015-02-10 DIAGNOSIS — K5090 Crohn's disease, unspecified, without complications: Secondary | ICD-10-CM | POA: Diagnosis not present

## 2015-02-10 DIAGNOSIS — K50813 Crohn's disease of both small and large intestine with fistula: Secondary | ICD-10-CM | POA: Diagnosis not present

## 2015-02-10 LAB — CBC WITH DIFFERENTIAL/PLATELET
Basophils Absolute: 0 10*3/uL (ref 0.0–0.1)
Basophils Relative: 0 % (ref 0–1)
Eosinophils Absolute: 0.4 10*3/uL (ref 0.0–0.7)
Eosinophils Relative: 6 % — ABNORMAL HIGH (ref 0–5)
HEMATOCRIT: 21.1 % — AB (ref 39.0–52.0)
Hemoglobin: 6.8 g/dL — CL (ref 13.0–17.0)
Lymphocytes Relative: 13 % (ref 12–46)
Lymphs Abs: 0.8 10*3/uL (ref 0.7–4.0)
MCH: 26.4 pg (ref 26.0–34.0)
MCHC: 32.2 g/dL (ref 30.0–36.0)
MCV: 81.8 fL (ref 78.0–100.0)
Monocytes Absolute: 0.5 10*3/uL (ref 0.1–1.0)
Monocytes Relative: 8 % (ref 3–12)
NEUTROS PCT: 73 % (ref 43–77)
Neutro Abs: 4.7 10*3/uL (ref 1.7–7.7)
Platelets: 58 10*3/uL — ABNORMAL LOW (ref 150–400)
RBC: 2.58 MIL/uL — ABNORMAL LOW (ref 4.22–5.81)
RDW: 22 % — AB (ref 11.5–15.5)
WBC: 6.4 10*3/uL (ref 4.0–10.5)

## 2015-02-10 LAB — COMPREHENSIVE METABOLIC PANEL
ALK PHOS: 320 U/L — AB (ref 39–117)
ALT: 29 U/L (ref 0–53)
ANION GAP: 4 — AB (ref 5–15)
AST: 44 U/L — AB (ref 0–37)
Albumin: 2.3 g/dL — ABNORMAL LOW (ref 3.5–5.2)
BUN: 26 mg/dL — AB (ref 6–23)
CALCIUM: 7.9 mg/dL — AB (ref 8.4–10.5)
CHLORIDE: 102 mmol/L (ref 96–112)
CO2: 26 mmol/L (ref 19–32)
Creatinine, Ser: 0.8 mg/dL (ref 0.50–1.35)
GFR calc Af Amer: >90 mL/min (ref >90)
GFR calc non Af Amer: >90 mL/min (ref >90)
Glucose, Bld: 119 mg/dL — ABNORMAL HIGH (ref 70–99)
Potassium: 3.7 mmol/L (ref 3.5–5.1)
SODIUM: 132 mmol/L — AB (ref 135–145)
TOTAL PROTEIN: 5.4 g/dL — AB (ref 6.0–8.3)
Total Bilirubin: 2.1 mg/dL — ABNORMAL HIGH (ref 0.3–1.2)

## 2015-02-10 LAB — MAGNESIUM: Magnesium: 1.7 mg/dL (ref 1.5–2.5)

## 2015-02-10 LAB — PHOSPHORUS: PHOSPHORUS: 4.3 mg/dL (ref 2.3–4.6)

## 2015-02-11 ENCOUNTER — Telehealth (INDEPENDENT_AMBULATORY_CARE_PROVIDER_SITE_OTHER): Payer: Self-pay | Admitting: *Deleted

## 2015-02-11 ENCOUNTER — Encounter (HOSPITAL_COMMUNITY)
Admission: RE | Admit: 2015-02-11 | Discharge: 2015-02-11 | Disposition: A | Discharge status: Home / Self Care | Payer: Medicare Other | Source: Ambulatory Visit | Attending: Internal Medicine | Admitting: Internal Medicine

## 2015-02-11 DIAGNOSIS — Z01818 Encounter for other preprocedural examination: Secondary | ICD-10-CM | POA: Diagnosis not present

## 2015-02-11 LAB — PREPARE RBC (CROSSMATCH)

## 2015-02-11 NOTE — Telephone Encounter (Signed)
Rec'd lab work from 02/10/15. Hgb - 6.8 Per Dr.Rehman / Dorene Ar NP-C , Type and Cross x 2 units of Pack Red Blood Cells                                                              650 mg of Tylenol PO prior to infusion                                                              50 mg of Benadryl PO prior to infusion                                                              Post Transfusion H&H  Orders have been faxed to the [facility] and patient 's wife has been made aware of result and that call will be rec'd from [facility] for time to be there for treatment.

## 2015-02-12 ENCOUNTER — Encounter (HOSPITAL_COMMUNITY): Payer: Self-pay

## 2015-02-12 ENCOUNTER — Ambulatory Visit (INDEPENDENT_AMBULATORY_CARE_PROVIDER_SITE_OTHER): Payer: Medicare Other | Admitting: Family Medicine

## 2015-02-12 ENCOUNTER — Encounter (HOSPITAL_COMMUNITY)
Admission: RE | Admit: 2015-02-12 | Discharge: 2015-02-12 | Disposition: A | Discharge status: Home / Self Care | Payer: Medicare Other | Source: Ambulatory Visit | Attending: Internal Medicine | Admitting: Internal Medicine

## 2015-02-12 ENCOUNTER — Encounter: Payer: Self-pay | Admitting: Family Medicine

## 2015-02-12 VITALS — BP 102/72 | Ht 71.0 in | Wt 173.3 lb

## 2015-02-12 DIAGNOSIS — K7460 Unspecified cirrhosis of liver: Secondary | ICD-10-CM | POA: Diagnosis not present

## 2015-02-12 DIAGNOSIS — D649 Anemia, unspecified: Secondary | ICD-10-CM | POA: Insufficient documentation

## 2015-02-12 LAB — HEMOGLOBIN AND HEMATOCRIT, BLOOD
HCT: 25.4 % — ABNORMAL LOW (ref 39.0–52.0)
Hemoglobin: 8.2 g/dL — ABNORMAL LOW (ref 13.0–17.0)

## 2015-02-12 MED ORDER — ACETAMINOPHEN 325 MG PO TABS
650.0000 mg | ORAL_TABLET | Freq: Once | ORAL | Status: AC
  Administered 2015-02-12: 650 mg via ORAL

## 2015-02-12 MED ORDER — DIPHENHYDRAMINE HCL 25 MG PO TABS
50.0000 mg | ORAL_TABLET | Freq: Once | ORAL | Status: AC
  Administered 2015-02-12: 50 mg via ORAL
  Filled 2015-02-12: qty 2

## 2015-02-12 MED ORDER — SODIUM CHLORIDE 0.9 % IV SOLN
Freq: Once | INTRAVENOUS | Status: AC
  Administered 2015-02-12: 11:00:00 via INTRAVENOUS

## 2015-02-12 MED ORDER — ACETAMINOPHEN 325 MG PO TABS
ORAL_TABLET | ORAL | Status: AC
Start: 2015-02-12 — End: 2015-02-12
  Filled 2015-02-12: qty 2

## 2015-02-12 MED ORDER — DIPHENHYDRAMINE HCL 25 MG PO CAPS
ORAL_CAPSULE | ORAL | Status: AC
Start: 2015-02-12 — End: 2015-02-12
  Filled 2015-02-12: qty 2

## 2015-02-12 NOTE — Progress Notes (Signed)
Results for SEBASTIN, DABDOUB (MRN [id number]) as of 02/12/2015 15:43  Ref. Range 02/12/2015 15:10  Hemoglobin Latest Range: 13.0-17.0 g/dL 8.2 (L)  HCT Latest Range: 39.0-52.0 % 25.4 (L)  Post transfusion. 2 units pRBCs received.

## 2015-02-12 NOTE — Progress Notes (Signed)
   Subjective:    Patient ID: WARDEN BUFFA, male    DOB: 1976/06/09, 39 y.o.   MRN: [id number]  HPI Patient arrives for follow for recent hospitalization for pneumonia. Patient will be seeing infectious disease specialist in up coming weeks. Patient had an extremely serious pneumonia. Was hospitalized in intensive care unit.  Now facing potential transplant of the liver and/or small bowel.  Patient is now on hyperalimentation. Receiving this through home health care. Dr. Dionicia Abler is see a local gastroenterologist overseeing this.  History of cirrhosis. Has noted mild recent swelling at the ankles. Claims compliance with both diet and medication.  Ongoing for chronic fatigue from all of his medical difficulties   Review of Systems No headache no chest pain some back pain. Ongoing abdominal pain no change in bowel habits    Objective:   Physical Exam Alert chronically ill appearing HEENT normal. Lungs no crackles. Heart regular in rhythm. Abdomen swollen. Ankles trace to 1+ edema bilateral       Assessment & Plan:  Impression #1 cirrhosis of liver #2 hyperalimentation #3 anemia see lab results. Due for transfusion shortly. Today. #4 recent pneumonia pulmonary more stable at this time #5 potential liver/bowel transplant plan I've advised the patient that he is enormously complicated at this time and requires ongoing close attention both from local GI specialist and [facility]. Easily 25 minutes spent most in discussion WSL

## 2015-02-13 LAB — TYPE AND SCREEN
ABO/RH(D): A POS
ANTIBODY SCREEN: NEGATIVE
UNIT DIVISION: 0
Unit division: 0

## 2015-02-17 DIAGNOSIS — I10 Essential (primary) hypertension: Secondary | ICD-10-CM | POA: Diagnosis not present

## 2015-02-17 DIAGNOSIS — J9600 Acute respiratory failure, unspecified whether with hypoxia or hypercapnia: Secondary | ICD-10-CM | POA: Diagnosis not present

## 2015-02-17 DIAGNOSIS — K50813 Crohn's disease of both small and large intestine with fistula: Secondary | ICD-10-CM | POA: Diagnosis not present

## 2015-02-17 DIAGNOSIS — K769 Liver disease, unspecified: Secondary | ICD-10-CM | POA: Diagnosis not present

## 2015-02-17 DIAGNOSIS — K269 Duodenal ulcer, unspecified as acute or chronic, without hemorrhage or perforation: Secondary | ICD-10-CM | POA: Diagnosis not present

## 2015-02-18 ENCOUNTER — Telehealth (INDEPENDENT_AMBULATORY_CARE_PROVIDER_SITE_OTHER): Payer: Self-pay | Admitting: *Deleted

## 2015-02-18 DIAGNOSIS — K5090 Crohn's disease, unspecified, without complications: Secondary | ICD-10-CM | POA: Diagnosis not present

## 2015-02-18 DIAGNOSIS — Z5181 Encounter for therapeutic drug level monitoring: Secondary | ICD-10-CM | POA: Diagnosis not present

## 2015-02-18 DIAGNOSIS — I10 Essential (primary) hypertension: Secondary | ICD-10-CM | POA: Diagnosis not present

## 2015-02-18 DIAGNOSIS — K769 Liver disease, unspecified: Secondary | ICD-10-CM | POA: Diagnosis not present

## 2015-02-18 DIAGNOSIS — K50813 Crohn's disease of both small and large intestine with fistula: Secondary | ICD-10-CM | POA: Diagnosis not present

## 2015-02-18 DIAGNOSIS — J9600 Acute respiratory failure, unspecified whether with hypoxia or hypercapnia: Secondary | ICD-10-CM | POA: Diagnosis not present

## 2015-02-18 DIAGNOSIS — K269 Duodenal ulcer, unspecified as acute or chronic, without hemorrhage or perforation: Secondary | ICD-10-CM | POA: Diagnosis not present

## 2015-02-18 DIAGNOSIS — K3184 Gastroparesis: Secondary | ICD-10-CM | POA: Diagnosis not present

## 2015-02-18 NOTE — Telephone Encounter (Signed)
I spoke with David [name] , she states that she saw David [name] today. He weighed 170 lbs - little fluid noted. Ankles measured 27-28 cm. They normally measure 25 cm ish.  The nurse call back number is [phone].  Per Dr.Rehman he goes by the weight. He talked with the patient on Friday.

## 2015-02-18 NOTE — Telephone Encounter (Signed)
[name] from [facility] would like for the nurse to return her call at [phone]. David [name] has some weight gain.

## 2015-02-19 ENCOUNTER — Other Ambulatory Visit (INDEPENDENT_AMBULATORY_CARE_PROVIDER_SITE_OTHER): Payer: Self-pay | Admitting: *Deleted

## 2015-02-19 DIAGNOSIS — K769 Liver disease, unspecified: Secondary | ICD-10-CM | POA: Diagnosis not present

## 2015-02-19 DIAGNOSIS — K50813 Crohn's disease of both small and large intestine with fistula: Secondary | ICD-10-CM | POA: Diagnosis not present

## 2015-02-19 DIAGNOSIS — I10 Essential (primary) hypertension: Secondary | ICD-10-CM | POA: Diagnosis not present

## 2015-02-19 DIAGNOSIS — J9600 Acute respiratory failure, unspecified whether with hypoxia or hypercapnia: Secondary | ICD-10-CM | POA: Diagnosis not present

## 2015-02-19 DIAGNOSIS — K269 Duodenal ulcer, unspecified as acute or chronic, without hemorrhage or perforation: Secondary | ICD-10-CM | POA: Diagnosis not present

## 2015-02-19 MED ORDER — OXYCODONE HCL 20 MG/ML PO CONC: 50.0000 mg | Freq: Four times a day (QID) | ORAL | Status: DC

## 2015-02-19 NOTE — Telephone Encounter (Signed)
Patient's mother called and ask that a new prescription be written for David [name] for pain medication. This was last written on 01/14/15.

## 2015-02-20 DIAGNOSIS — I10 Essential (primary) hypertension: Secondary | ICD-10-CM | POA: Diagnosis not present

## 2015-02-20 DIAGNOSIS — K50813 Crohn's disease of both small and large intestine with fistula: Secondary | ICD-10-CM | POA: Diagnosis not present

## 2015-02-20 DIAGNOSIS — K769 Liver disease, unspecified: Secondary | ICD-10-CM | POA: Diagnosis not present

## 2015-02-20 DIAGNOSIS — K269 Duodenal ulcer, unspecified as acute or chronic, without hemorrhage or perforation: Secondary | ICD-10-CM | POA: Diagnosis not present

## 2015-02-20 DIAGNOSIS — J9600 Acute respiratory failure, unspecified whether with hypoxia or hypercapnia: Secondary | ICD-10-CM | POA: Diagnosis not present

## 2015-02-21 DIAGNOSIS — J9600 Acute respiratory failure, unspecified whether with hypoxia or hypercapnia: Secondary | ICD-10-CM | POA: Diagnosis not present

## 2015-02-21 DIAGNOSIS — K50813 Crohn's disease of both small and large intestine with fistula: Secondary | ICD-10-CM | POA: Diagnosis not present

## 2015-02-21 DIAGNOSIS — K269 Duodenal ulcer, unspecified as acute or chronic, without hemorrhage or perforation: Secondary | ICD-10-CM | POA: Diagnosis not present

## 2015-02-21 DIAGNOSIS — I10 Essential (primary) hypertension: Secondary | ICD-10-CM | POA: Diagnosis not present

## 2015-02-21 DIAGNOSIS — K769 Liver disease, unspecified: Secondary | ICD-10-CM | POA: Diagnosis not present

## 2015-02-22 DIAGNOSIS — I10 Essential (primary) hypertension: Secondary | ICD-10-CM | POA: Diagnosis not present

## 2015-02-22 DIAGNOSIS — K269 Duodenal ulcer, unspecified as acute or chronic, without hemorrhage or perforation: Secondary | ICD-10-CM | POA: Diagnosis not present

## 2015-02-22 DIAGNOSIS — K50813 Crohn's disease of both small and large intestine with fistula: Secondary | ICD-10-CM | POA: Diagnosis not present

## 2015-02-22 DIAGNOSIS — J9600 Acute respiratory failure, unspecified whether with hypoxia or hypercapnia: Secondary | ICD-10-CM | POA: Diagnosis not present

## 2015-02-22 DIAGNOSIS — K769 Liver disease, unspecified: Secondary | ICD-10-CM | POA: Diagnosis not present

## 2015-02-23 DIAGNOSIS — K50813 Crohn's disease of both small and large intestine with fistula: Secondary | ICD-10-CM | POA: Diagnosis not present

## 2015-02-23 DIAGNOSIS — I10 Essential (primary) hypertension: Secondary | ICD-10-CM | POA: Diagnosis not present

## 2015-02-23 DIAGNOSIS — K269 Duodenal ulcer, unspecified as acute or chronic, without hemorrhage or perforation: Secondary | ICD-10-CM | POA: Diagnosis not present

## 2015-02-23 DIAGNOSIS — J9600 Acute respiratory failure, unspecified whether with hypoxia or hypercapnia: Secondary | ICD-10-CM | POA: Diagnosis not present

## 2015-02-23 DIAGNOSIS — K769 Liver disease, unspecified: Secondary | ICD-10-CM | POA: Diagnosis not present

## 2015-02-24 DIAGNOSIS — I10 Essential (primary) hypertension: Secondary | ICD-10-CM | POA: Diagnosis not present

## 2015-02-24 DIAGNOSIS — K50813 Crohn's disease of both small and large intestine with fistula: Secondary | ICD-10-CM | POA: Diagnosis not present

## 2015-02-24 DIAGNOSIS — K269 Duodenal ulcer, unspecified as acute or chronic, without hemorrhage or perforation: Secondary | ICD-10-CM | POA: Diagnosis not present

## 2015-02-24 DIAGNOSIS — K769 Liver disease, unspecified: Secondary | ICD-10-CM | POA: Diagnosis not present

## 2015-02-24 DIAGNOSIS — J9600 Acute respiratory failure, unspecified whether with hypoxia or hypercapnia: Secondary | ICD-10-CM | POA: Diagnosis not present

## 2015-02-25 DIAGNOSIS — K5090 Crohn's disease, unspecified, without complications: Secondary | ICD-10-CM | POA: Diagnosis not present

## 2015-02-25 DIAGNOSIS — Z5181 Encounter for therapeutic drug level monitoring: Secondary | ICD-10-CM | POA: Diagnosis not present

## 2015-02-25 DIAGNOSIS — I10 Essential (primary) hypertension: Secondary | ICD-10-CM | POA: Diagnosis not present

## 2015-02-25 DIAGNOSIS — I2510 Atherosclerotic heart disease of native coronary artery without angina pectoris: Secondary | ICD-10-CM | POA: Diagnosis not present

## 2015-02-25 DIAGNOSIS — E785 Hyperlipidemia, unspecified: Secondary | ICD-10-CM | POA: Diagnosis not present

## 2015-02-25 DIAGNOSIS — K769 Liver disease, unspecified: Secondary | ICD-10-CM | POA: Diagnosis not present

## 2015-02-25 DIAGNOSIS — J9600 Acute respiratory failure, unspecified whether with hypoxia or hypercapnia: Secondary | ICD-10-CM | POA: Diagnosis not present

## 2015-02-25 DIAGNOSIS — K50813 Crohn's disease of both small and large intestine with fistula: Secondary | ICD-10-CM | POA: Diagnosis not present

## 2015-02-25 DIAGNOSIS — K269 Duodenal ulcer, unspecified as acute or chronic, without hemorrhage or perforation: Secondary | ICD-10-CM | POA: Diagnosis not present

## 2015-02-26 DIAGNOSIS — K269 Duodenal ulcer, unspecified as acute or chronic, without hemorrhage or perforation: Secondary | ICD-10-CM | POA: Diagnosis not present

## 2015-02-26 DIAGNOSIS — J9600 Acute respiratory failure, unspecified whether with hypoxia or hypercapnia: Secondary | ICD-10-CM | POA: Diagnosis not present

## 2015-02-26 DIAGNOSIS — I10 Essential (primary) hypertension: Secondary | ICD-10-CM | POA: Diagnosis not present

## 2015-02-26 DIAGNOSIS — K5090 Crohn's disease, unspecified, without complications: Secondary | ICD-10-CM | POA: Diagnosis not present

## 2015-02-26 DIAGNOSIS — K50813 Crohn's disease of both small and large intestine with fistula: Secondary | ICD-10-CM | POA: Diagnosis not present

## 2015-02-26 DIAGNOSIS — K769 Liver disease, unspecified: Secondary | ICD-10-CM | POA: Diagnosis not present

## 2015-02-27 DIAGNOSIS — Z452 Encounter for adjustment and management of vascular access device: Secondary | ICD-10-CM | POA: Diagnosis not present

## 2015-02-27 DIAGNOSIS — K50813 Crohn's disease of both small and large intestine with fistula: Secondary | ICD-10-CM | POA: Diagnosis not present

## 2015-02-27 DIAGNOSIS — K269 Duodenal ulcer, unspecified as acute or chronic, without hemorrhage or perforation: Secondary | ICD-10-CM | POA: Diagnosis not present

## 2015-02-27 DIAGNOSIS — I10 Essential (primary) hypertension: Secondary | ICD-10-CM | POA: Diagnosis not present

## 2015-02-27 DIAGNOSIS — K769 Liver disease, unspecified: Secondary | ICD-10-CM | POA: Diagnosis not present

## 2015-02-27 DIAGNOSIS — J9600 Acute respiratory failure, unspecified whether with hypoxia or hypercapnia: Secondary | ICD-10-CM | POA: Diagnosis not present

## 2015-02-27 DIAGNOSIS — K5090 Crohn's disease, unspecified, without complications: Secondary | ICD-10-CM | POA: Diagnosis not present

## 2015-02-28 DIAGNOSIS — K269 Duodenal ulcer, unspecified as acute or chronic, without hemorrhage or perforation: Secondary | ICD-10-CM | POA: Diagnosis not present

## 2015-02-28 DIAGNOSIS — K769 Liver disease, unspecified: Secondary | ICD-10-CM | POA: Diagnosis not present

## 2015-02-28 DIAGNOSIS — J9600 Acute respiratory failure, unspecified whether with hypoxia or hypercapnia: Secondary | ICD-10-CM | POA: Diagnosis not present

## 2015-02-28 DIAGNOSIS — I10 Essential (primary) hypertension: Secondary | ICD-10-CM | POA: Diagnosis not present

## 2015-02-28 DIAGNOSIS — K50813 Crohn's disease of both small and large intestine with fistula: Secondary | ICD-10-CM | POA: Diagnosis not present

## 2015-03-01 DIAGNOSIS — K769 Liver disease, unspecified: Secondary | ICD-10-CM | POA: Diagnosis not present

## 2015-03-01 DIAGNOSIS — I10 Essential (primary) hypertension: Secondary | ICD-10-CM | POA: Diagnosis not present

## 2015-03-01 DIAGNOSIS — J9600 Acute respiratory failure, unspecified whether with hypoxia or hypercapnia: Secondary | ICD-10-CM | POA: Diagnosis not present

## 2015-03-01 DIAGNOSIS — K269 Duodenal ulcer, unspecified as acute or chronic, without hemorrhage or perforation: Secondary | ICD-10-CM | POA: Diagnosis not present

## 2015-03-01 DIAGNOSIS — K50813 Crohn's disease of both small and large intestine with fistula: Secondary | ICD-10-CM | POA: Diagnosis not present

## 2015-03-02 DIAGNOSIS — K269 Duodenal ulcer, unspecified as acute or chronic, without hemorrhage or perforation: Secondary | ICD-10-CM | POA: Diagnosis not present

## 2015-03-02 DIAGNOSIS — J9600 Acute respiratory failure, unspecified whether with hypoxia or hypercapnia: Secondary | ICD-10-CM | POA: Diagnosis not present

## 2015-03-02 DIAGNOSIS — I10 Essential (primary) hypertension: Secondary | ICD-10-CM | POA: Diagnosis not present

## 2015-03-02 DIAGNOSIS — K769 Liver disease, unspecified: Secondary | ICD-10-CM | POA: Diagnosis not present

## 2015-03-02 DIAGNOSIS — K50813 Crohn's disease of both small and large intestine with fistula: Secondary | ICD-10-CM | POA: Diagnosis not present

## 2015-03-03 DIAGNOSIS — I10 Essential (primary) hypertension: Secondary | ICD-10-CM | POA: Diagnosis not present

## 2015-03-03 DIAGNOSIS — K769 Liver disease, unspecified: Secondary | ICD-10-CM | POA: Diagnosis not present

## 2015-03-03 DIAGNOSIS — K269 Duodenal ulcer, unspecified as acute or chronic, without hemorrhage or perforation: Secondary | ICD-10-CM | POA: Diagnosis not present

## 2015-03-03 DIAGNOSIS — J9600 Acute respiratory failure, unspecified whether with hypoxia or hypercapnia: Secondary | ICD-10-CM | POA: Diagnosis not present

## 2015-03-03 DIAGNOSIS — K50813 Crohn's disease of both small and large intestine with fistula: Secondary | ICD-10-CM | POA: Diagnosis not present

## 2015-03-04 ENCOUNTER — Encounter (INDEPENDENT_AMBULATORY_CARE_PROVIDER_SITE_OTHER): Payer: Self-pay

## 2015-03-04 ENCOUNTER — Encounter (HOSPITAL_COMMUNITY)
Admission: RE | Admit: 2015-03-04 | Discharge: 2015-03-04 | Disposition: A | Discharge status: Home / Self Care | Payer: Medicare Other | Source: Ambulatory Visit | Attending: Internal Medicine | Admitting: Internal Medicine

## 2015-03-04 DIAGNOSIS — J189 Pneumonia, unspecified organism: Secondary | ICD-10-CM | POA: Diagnosis not present

## 2015-03-04 DIAGNOSIS — K269 Duodenal ulcer, unspecified as acute or chronic, without hemorrhage or perforation: Secondary | ICD-10-CM | POA: Diagnosis not present

## 2015-03-04 DIAGNOSIS — K5090 Crohn's disease, unspecified, without complications: Secondary | ICD-10-CM | POA: Diagnosis not present

## 2015-03-04 DIAGNOSIS — D649 Anemia, unspecified: Secondary | ICD-10-CM | POA: Diagnosis not present

## 2015-03-04 DIAGNOSIS — Z452 Encounter for adjustment and management of vascular access device: Secondary | ICD-10-CM | POA: Diagnosis not present

## 2015-03-04 DIAGNOSIS — I10 Essential (primary) hypertension: Secondary | ICD-10-CM | POA: Diagnosis not present

## 2015-03-04 DIAGNOSIS — K769 Liver disease, unspecified: Secondary | ICD-10-CM | POA: Diagnosis not present

## 2015-03-04 DIAGNOSIS — Z5189 Encounter for other specified aftercare: Secondary | ICD-10-CM | POA: Diagnosis not present

## 2015-03-04 DIAGNOSIS — K50813 Crohn's disease of both small and large intestine with fistula: Secondary | ICD-10-CM | POA: Diagnosis not present

## 2015-03-04 DIAGNOSIS — J9600 Acute respiratory failure, unspecified whether with hypoxia or hypercapnia: Secondary | ICD-10-CM | POA: Diagnosis not present

## 2015-03-04 LAB — HEMOGLOBIN AND HEMATOCRIT, BLOOD
HEMATOCRIT: 24.8 % — AB (ref 39.0–52.0)
Hemoglobin: 7.7 g/dL — ABNORMAL LOW (ref 13.0–17.0)

## 2015-03-04 LAB — PREPARE RBC (CROSSMATCH)

## 2015-03-05 ENCOUNTER — Encounter (INDEPENDENT_AMBULATORY_CARE_PROVIDER_SITE_OTHER): Payer: Self-pay

## 2015-03-05 DIAGNOSIS — K269 Duodenal ulcer, unspecified as acute or chronic, without hemorrhage or perforation: Secondary | ICD-10-CM | POA: Diagnosis not present

## 2015-03-05 DIAGNOSIS — K769 Liver disease, unspecified: Secondary | ICD-10-CM | POA: Diagnosis not present

## 2015-03-05 DIAGNOSIS — K50813 Crohn's disease of both small and large intestine with fistula: Secondary | ICD-10-CM | POA: Diagnosis not present

## 2015-03-05 DIAGNOSIS — J9600 Acute respiratory failure, unspecified whether with hypoxia or hypercapnia: Secondary | ICD-10-CM | POA: Diagnosis not present

## 2015-03-05 DIAGNOSIS — I10 Essential (primary) hypertension: Secondary | ICD-10-CM | POA: Diagnosis not present

## 2015-03-06 ENCOUNTER — Encounter (HOSPITAL_COMMUNITY): Payer: Self-pay

## 2015-03-06 ENCOUNTER — Encounter (HOSPITAL_COMMUNITY)
Admission: RE | Admit: 2015-03-06 | Discharge: 2015-03-06 | Disposition: A | Discharge status: Home / Self Care | Payer: Medicare Other | Source: Ambulatory Visit | Attending: Internal Medicine | Admitting: Internal Medicine

## 2015-03-06 DIAGNOSIS — J9600 Acute respiratory failure, unspecified whether with hypoxia or hypercapnia: Secondary | ICD-10-CM | POA: Diagnosis not present

## 2015-03-06 DIAGNOSIS — K769 Liver disease, unspecified: Secondary | ICD-10-CM | POA: Diagnosis not present

## 2015-03-06 DIAGNOSIS — K269 Duodenal ulcer, unspecified as acute or chronic, without hemorrhage or perforation: Secondary | ICD-10-CM | POA: Diagnosis not present

## 2015-03-06 DIAGNOSIS — I10 Essential (primary) hypertension: Secondary | ICD-10-CM | POA: Diagnosis not present

## 2015-03-06 DIAGNOSIS — K50813 Crohn's disease of both small and large intestine with fistula: Secondary | ICD-10-CM | POA: Diagnosis not present

## 2015-03-06 DIAGNOSIS — D649 Anemia, unspecified: Secondary | ICD-10-CM | POA: Diagnosis not present

## 2015-03-06 LAB — HEMOGLOBIN AND HEMATOCRIT, BLOOD
HCT: 25.7 % — ABNORMAL LOW (ref 39.0–52.0)
HEMOGLOBIN: 8.3 g/dL — AB (ref 13.0–17.0)

## 2015-03-06 MED ORDER — ACETAMINOPHEN 325 MG PO TABS
ORAL_TABLET | ORAL | Status: AC
  Filled 2015-03-06: qty 2

## 2015-03-06 MED ORDER — DIPHENHYDRAMINE HCL 25 MG PO CAPS
ORAL_CAPSULE | ORAL | Status: AC
  Filled 2015-03-06: qty 2

## 2015-03-06 MED ORDER — SODIUM CHLORIDE 0.9 % IV SOLN
Freq: Once | INTRAVENOUS | Status: AC
  Administered 2015-03-06: 250 mL via INTRAVENOUS

## 2015-03-06 MED ORDER — DIPHENHYDRAMINE HCL 25 MG PO CAPS
50.0000 mg | ORAL_CAPSULE | Freq: Once | ORAL | Status: AC
  Administered 2015-03-06: 50 mg via ORAL

## 2015-03-06 MED ORDER — ACETAMINOPHEN 325 MG PO TABS
650.0000 mg | ORAL_TABLET | Freq: Once | ORAL | Status: AC
  Administered 2015-03-06: 650 mg via ORAL

## 2015-03-06 NOTE — Progress Notes (Signed)
Results for David [name], David [name] (MRN [id number]) as of 03/06/2015 13:55 Status post transfusion, 2 units  Ref. Range 03/06/2015 12:53  Hemoglobin Latest Range: 13.0-17.0 g/dL 8.3 (L)  HCT Latest Range: 39.0-52.0 % 25.7 (L)

## 2015-03-07 DIAGNOSIS — J9600 Acute respiratory failure, unspecified whether with hypoxia or hypercapnia: Secondary | ICD-10-CM | POA: Diagnosis not present

## 2015-03-07 DIAGNOSIS — I10 Essential (primary) hypertension: Secondary | ICD-10-CM | POA: Diagnosis not present

## 2015-03-07 DIAGNOSIS — K769 Liver disease, unspecified: Secondary | ICD-10-CM | POA: Diagnosis not present

## 2015-03-07 DIAGNOSIS — K50813 Crohn's disease of both small and large intestine with fistula: Secondary | ICD-10-CM | POA: Diagnosis not present

## 2015-03-07 DIAGNOSIS — K269 Duodenal ulcer, unspecified as acute or chronic, without hemorrhage or perforation: Secondary | ICD-10-CM | POA: Diagnosis not present

## 2015-03-07 LAB — TYPE AND SCREEN
ABO/RH(D): A POS
Antibody Screen: NEGATIVE
Unit division: 0
Unit division: 0

## 2015-03-08 DIAGNOSIS — K269 Duodenal ulcer, unspecified as acute or chronic, without hemorrhage or perforation: Secondary | ICD-10-CM | POA: Diagnosis not present

## 2015-03-08 DIAGNOSIS — J9600 Acute respiratory failure, unspecified whether with hypoxia or hypercapnia: Secondary | ICD-10-CM | POA: Diagnosis not present

## 2015-03-08 DIAGNOSIS — K769 Liver disease, unspecified: Secondary | ICD-10-CM | POA: Diagnosis not present

## 2015-03-08 DIAGNOSIS — K50813 Crohn's disease of both small and large intestine with fistula: Secondary | ICD-10-CM | POA: Diagnosis not present

## 2015-03-08 DIAGNOSIS — I10 Essential (primary) hypertension: Secondary | ICD-10-CM | POA: Diagnosis not present

## 2015-03-09 DIAGNOSIS — J9600 Acute respiratory failure, unspecified whether with hypoxia or hypercapnia: Secondary | ICD-10-CM | POA: Diagnosis not present

## 2015-03-09 DIAGNOSIS — K769 Liver disease, unspecified: Secondary | ICD-10-CM | POA: Diagnosis not present

## 2015-03-09 DIAGNOSIS — I10 Essential (primary) hypertension: Secondary | ICD-10-CM | POA: Diagnosis not present

## 2015-03-09 DIAGNOSIS — K269 Duodenal ulcer, unspecified as acute or chronic, without hemorrhage or perforation: Secondary | ICD-10-CM | POA: Diagnosis not present

## 2015-03-09 DIAGNOSIS — K50813 Crohn's disease of both small and large intestine with fistula: Secondary | ICD-10-CM | POA: Diagnosis not present

## 2015-03-10 DIAGNOSIS — K269 Duodenal ulcer, unspecified as acute or chronic, without hemorrhage or perforation: Secondary | ICD-10-CM | POA: Diagnosis not present

## 2015-03-10 DIAGNOSIS — K50813 Crohn's disease of both small and large intestine with fistula: Secondary | ICD-10-CM | POA: Diagnosis not present

## 2015-03-10 DIAGNOSIS — J9600 Acute respiratory failure, unspecified whether with hypoxia or hypercapnia: Secondary | ICD-10-CM | POA: Diagnosis not present

## 2015-03-10 DIAGNOSIS — I10 Essential (primary) hypertension: Secondary | ICD-10-CM | POA: Diagnosis not present

## 2015-03-10 DIAGNOSIS — K769 Liver disease, unspecified: Secondary | ICD-10-CM | POA: Diagnosis not present

## 2015-03-11 ENCOUNTER — Telehealth (INDEPENDENT_AMBULATORY_CARE_PROVIDER_SITE_OTHER): Payer: Self-pay | Admitting: *Deleted

## 2015-03-11 DIAGNOSIS — J189 Pneumonia, unspecified organism: Secondary | ICD-10-CM | POA: Diagnosis not present

## 2015-03-11 DIAGNOSIS — R109 Unspecified abdominal pain: Secondary | ICD-10-CM | POA: Diagnosis not present

## 2015-03-11 DIAGNOSIS — R609 Edema, unspecified: Secondary | ICD-10-CM | POA: Diagnosis not present

## 2015-03-11 DIAGNOSIS — J690 Pneumonitis due to inhalation of food and vomit: Secondary | ICD-10-CM | POA: Diagnosis not present

## 2015-03-11 DIAGNOSIS — I10 Essential (primary) hypertension: Secondary | ICD-10-CM | POA: Diagnosis not present

## 2015-03-11 DIAGNOSIS — Z119 Encounter for screening for infectious and parasitic diseases, unspecified: Secondary | ICD-10-CM | POA: Diagnosis not present

## 2015-03-11 DIAGNOSIS — Z9049 Acquired absence of other specified parts of digestive tract: Secondary | ICD-10-CM | POA: Diagnosis not present

## 2015-03-11 DIAGNOSIS — J9600 Acute respiratory failure, unspecified whether with hypoxia or hypercapnia: Secondary | ICD-10-CM | POA: Diagnosis not present

## 2015-03-11 DIAGNOSIS — R1110 Vomiting, unspecified: Secondary | ICD-10-CM | POA: Diagnosis not present

## 2015-03-11 DIAGNOSIS — Z789 Other specified health status: Secondary | ICD-10-CM | POA: Diagnosis not present

## 2015-03-11 DIAGNOSIS — K50012 Crohn's disease of small intestine with intestinal obstruction: Secondary | ICD-10-CM | POA: Diagnosis not present

## 2015-03-11 DIAGNOSIS — K769 Liver disease, unspecified: Secondary | ICD-10-CM | POA: Diagnosis not present

## 2015-03-11 DIAGNOSIS — Z4682 Encounter for fitting and adjustment of non-vascular catheter: Secondary | ICD-10-CM | POA: Diagnosis not present

## 2015-03-11 DIAGNOSIS — K269 Duodenal ulcer, unspecified as acute or chronic, without hemorrhage or perforation: Secondary | ICD-10-CM | POA: Diagnosis not present

## 2015-03-11 DIAGNOSIS — J9601 Acute respiratory failure with hypoxia: Secondary | ICD-10-CM | POA: Diagnosis not present

## 2015-03-11 DIAGNOSIS — K7469 Other cirrhosis of liver: Secondary | ICD-10-CM | POA: Diagnosis not present

## 2015-03-11 DIAGNOSIS — K50812 Crohn's disease of both small and large intestine with intestinal obstruction: Secondary | ICD-10-CM | POA: Diagnosis not present

## 2015-03-11 DIAGNOSIS — Z87891 Personal history of nicotine dependence: Secondary | ICD-10-CM | POA: Diagnosis not present

## 2015-03-11 DIAGNOSIS — Z452 Encounter for adjustment and management of vascular access device: Secondary | ICD-10-CM | POA: Diagnosis not present

## 2015-03-11 DIAGNOSIS — G8929 Other chronic pain: Secondary | ICD-10-CM | POA: Diagnosis not present

## 2015-03-11 DIAGNOSIS — T451X5S Adverse effect of antineoplastic and immunosuppressive drugs, sequela: Secondary | ICD-10-CM | POA: Diagnosis not present

## 2015-03-11 DIAGNOSIS — K717 Toxic liver disease with fibrosis and cirrhosis of liver: Secondary | ICD-10-CM | POA: Diagnosis not present

## 2015-03-11 DIAGNOSIS — Z5181 Encounter for therapeutic drug level monitoring: Secondary | ICD-10-CM | POA: Diagnosis not present

## 2015-03-11 DIAGNOSIS — J90 Pleural effusion, not elsewhere classified: Secondary | ICD-10-CM | POA: Diagnosis not present

## 2015-03-11 DIAGNOSIS — R161 Splenomegaly, not elsewhere classified: Secondary | ICD-10-CM | POA: Diagnosis not present

## 2015-03-11 DIAGNOSIS — J9602 Acute respiratory failure with hypercapnia: Secondary | ICD-10-CM | POA: Diagnosis not present

## 2015-03-11 DIAGNOSIS — Z7682 Awaiting organ transplant status: Secondary | ICD-10-CM | POA: Diagnosis not present

## 2015-03-11 DIAGNOSIS — K912 Postsurgical malabsorption, not elsewhere classified: Secondary | ICD-10-CM | POA: Diagnosis not present

## 2015-03-11 DIAGNOSIS — J101 Influenza due to other identified influenza virus with other respiratory manifestations: Secondary | ICD-10-CM | POA: Diagnosis not present

## 2015-03-11 DIAGNOSIS — F1190 Opioid use, unspecified, uncomplicated: Secondary | ICD-10-CM | POA: Diagnosis not present

## 2015-03-11 DIAGNOSIS — R1084 Generalized abdominal pain: Secondary | ICD-10-CM | POA: Diagnosis not present

## 2015-03-11 DIAGNOSIS — E063 Autoimmune thyroiditis: Secondary | ICD-10-CM | POA: Diagnosis not present

## 2015-03-11 DIAGNOSIS — Z931 Gastrostomy status: Secondary | ICD-10-CM | POA: Diagnosis not present

## 2015-03-11 DIAGNOSIS — K639 Disease of intestine, unspecified: Secondary | ICD-10-CM | POA: Diagnosis not present

## 2015-03-11 DIAGNOSIS — D649 Anemia, unspecified: Secondary | ICD-10-CM | POA: Diagnosis not present

## 2015-03-11 DIAGNOSIS — Z01818 Encounter for other preprocedural examination: Secondary | ICD-10-CM | POA: Diagnosis not present

## 2015-03-11 DIAGNOSIS — R601 Generalized edema: Secondary | ICD-10-CM | POA: Diagnosis not present

## 2015-03-11 DIAGNOSIS — K7460 Unspecified cirrhosis of liver: Secondary | ICD-10-CM | POA: Diagnosis not present

## 2015-03-11 DIAGNOSIS — R918 Other nonspecific abnormal finding of lung field: Secondary | ICD-10-CM | POA: Diagnosis not present

## 2015-03-11 DIAGNOSIS — D509 Iron deficiency anemia, unspecified: Secondary | ICD-10-CM | POA: Diagnosis not present

## 2015-03-11 DIAGNOSIS — K50813 Crohn's disease of both small and large intestine with fistula: Secondary | ICD-10-CM | POA: Diagnosis not present

## 2015-03-11 DIAGNOSIS — Z23 Encounter for immunization: Secondary | ICD-10-CM | POA: Diagnosis not present

## 2015-03-11 DIAGNOSIS — R188 Other ascites: Secondary | ICD-10-CM | POA: Diagnosis not present

## 2015-03-11 DIAGNOSIS — K219 Gastro-esophageal reflux disease without esophagitis: Secondary | ICD-10-CM | POA: Diagnosis not present

## 2015-03-11 DIAGNOSIS — Z7952 Long term (current) use of systemic steroids: Secondary | ICD-10-CM | POA: Diagnosis not present

## 2015-03-11 DIAGNOSIS — K5660 Unspecified intestinal obstruction: Secondary | ICD-10-CM | POA: Diagnosis not present

## 2015-03-11 NOTE — Telephone Encounter (Signed)
David [name] states that she saw [name] today. He weighed 190 lbs. Last week he weighed 183 lbs. Lower extremities are edematous. Productive cough - yellow sputum Patient told Home Health Nurse that he feels that he aspirated on Friday night as he woke himself up choking. Left lobe - fine bronchi sounds noted. Afebrile Patient states that he is no feeling really good. Medications have not changed. Lab work was drawn today. David [name] is asking if an antibiotic can be called inor does he need office visit?  If patient feels he has aspirated and he has a yellow productive sputum. Per Dr.Rehman he needs to go to the ED as he will need X-Ray ect. David [name] was called and made aware. She will call the patient.

## 2015-03-12 DIAGNOSIS — K50813 Crohn's disease of both small and large intestine with fistula: Secondary | ICD-10-CM | POA: Diagnosis not present

## 2015-03-12 DIAGNOSIS — J9600 Acute respiratory failure, unspecified whether with hypoxia or hypercapnia: Secondary | ICD-10-CM | POA: Diagnosis not present

## 2015-03-12 DIAGNOSIS — K769 Liver disease, unspecified: Secondary | ICD-10-CM | POA: Diagnosis not present

## 2015-03-12 DIAGNOSIS — K269 Duodenal ulcer, unspecified as acute or chronic, without hemorrhage or perforation: Secondary | ICD-10-CM | POA: Diagnosis not present

## 2015-03-12 DIAGNOSIS — I10 Essential (primary) hypertension: Secondary | ICD-10-CM | POA: Diagnosis not present

## 2015-03-13 DIAGNOSIS — I10 Essential (primary) hypertension: Secondary | ICD-10-CM | POA: Diagnosis not present

## 2015-03-13 DIAGNOSIS — J9600 Acute respiratory failure, unspecified whether with hypoxia or hypercapnia: Secondary | ICD-10-CM | POA: Diagnosis not present

## 2015-03-13 DIAGNOSIS — K50813 Crohn's disease of both small and large intestine with fistula: Secondary | ICD-10-CM | POA: Diagnosis not present

## 2015-03-13 DIAGNOSIS — K769 Liver disease, unspecified: Secondary | ICD-10-CM | POA: Diagnosis not present

## 2015-03-13 DIAGNOSIS — K269 Duodenal ulcer, unspecified as acute or chronic, without hemorrhage or perforation: Secondary | ICD-10-CM | POA: Diagnosis not present

## 2015-03-14 DIAGNOSIS — I10 Essential (primary) hypertension: Secondary | ICD-10-CM | POA: Diagnosis not present

## 2015-03-14 DIAGNOSIS — J9600 Acute respiratory failure, unspecified whether with hypoxia or hypercapnia: Secondary | ICD-10-CM | POA: Diagnosis not present

## 2015-03-14 DIAGNOSIS — K769 Liver disease, unspecified: Secondary | ICD-10-CM | POA: Diagnosis not present

## 2015-03-14 DIAGNOSIS — K50813 Crohn's disease of both small and large intestine with fistula: Secondary | ICD-10-CM | POA: Diagnosis not present

## 2015-03-14 DIAGNOSIS — K269 Duodenal ulcer, unspecified as acute or chronic, without hemorrhage or perforation: Secondary | ICD-10-CM | POA: Diagnosis not present

## 2015-03-15 DIAGNOSIS — K50813 Crohn's disease of both small and large intestine with fistula: Secondary | ICD-10-CM | POA: Diagnosis not present

## 2015-03-15 DIAGNOSIS — K769 Liver disease, unspecified: Secondary | ICD-10-CM | POA: Diagnosis not present

## 2015-03-15 DIAGNOSIS — I10 Essential (primary) hypertension: Secondary | ICD-10-CM | POA: Diagnosis not present

## 2015-03-15 DIAGNOSIS — K269 Duodenal ulcer, unspecified as acute or chronic, without hemorrhage or perforation: Secondary | ICD-10-CM | POA: Diagnosis not present

## 2015-03-15 DIAGNOSIS — J9600 Acute respiratory failure, unspecified whether with hypoxia or hypercapnia: Secondary | ICD-10-CM | POA: Diagnosis not present

## 2015-03-16 DIAGNOSIS — K50813 Crohn's disease of both small and large intestine with fistula: Secondary | ICD-10-CM | POA: Diagnosis not present

## 2015-03-16 DIAGNOSIS — K269 Duodenal ulcer, unspecified as acute or chronic, without hemorrhage or perforation: Secondary | ICD-10-CM | POA: Diagnosis not present

## 2015-03-16 DIAGNOSIS — I10 Essential (primary) hypertension: Secondary | ICD-10-CM | POA: Diagnosis not present

## 2015-03-16 DIAGNOSIS — J9600 Acute respiratory failure, unspecified whether with hypoxia or hypercapnia: Secondary | ICD-10-CM | POA: Diagnosis not present

## 2015-03-16 DIAGNOSIS — K769 Liver disease, unspecified: Secondary | ICD-10-CM | POA: Diagnosis not present

## 2015-03-26 DIAGNOSIS — Z23 Encounter for immunization: Secondary | ICD-10-CM | POA: Diagnosis not present

## 2015-03-27 DIAGNOSIS — J9600 Acute respiratory failure, unspecified whether with hypoxia or hypercapnia: Secondary | ICD-10-CM | POA: Diagnosis not present

## 2015-03-27 DIAGNOSIS — K769 Liver disease, unspecified: Secondary | ICD-10-CM | POA: Diagnosis not present

## 2015-03-27 DIAGNOSIS — I10 Essential (primary) hypertension: Secondary | ICD-10-CM | POA: Diagnosis not present

## 2015-03-27 DIAGNOSIS — K269 Duodenal ulcer, unspecified as acute or chronic, without hemorrhage or perforation: Secondary | ICD-10-CM | POA: Diagnosis not present

## 2015-03-27 DIAGNOSIS — J189 Pneumonia, unspecified organism: Secondary | ICD-10-CM | POA: Diagnosis not present

## 2015-03-27 DIAGNOSIS — Z452 Encounter for adjustment and management of vascular access device: Secondary | ICD-10-CM | POA: Diagnosis not present

## 2015-03-27 DIAGNOSIS — K50813 Crohn's disease of both small and large intestine with fistula: Secondary | ICD-10-CM | POA: Diagnosis not present

## 2015-03-28 ENCOUNTER — Encounter (INDEPENDENT_AMBULATORY_CARE_PROVIDER_SITE_OTHER): Payer: Self-pay

## 2015-03-28 DIAGNOSIS — K769 Liver disease, unspecified: Secondary | ICD-10-CM | POA: Diagnosis not present

## 2015-03-28 DIAGNOSIS — K50813 Crohn's disease of both small and large intestine with fistula: Secondary | ICD-10-CM | POA: Diagnosis not present

## 2015-03-28 DIAGNOSIS — I10 Essential (primary) hypertension: Secondary | ICD-10-CM | POA: Diagnosis not present

## 2015-03-28 DIAGNOSIS — K269 Duodenal ulcer, unspecified as acute or chronic, without hemorrhage or perforation: Secondary | ICD-10-CM | POA: Diagnosis not present

## 2015-03-28 DIAGNOSIS — J9600 Acute respiratory failure, unspecified whether with hypoxia or hypercapnia: Secondary | ICD-10-CM | POA: Diagnosis not present

## 2015-03-29 DIAGNOSIS — K769 Liver disease, unspecified: Secondary | ICD-10-CM | POA: Diagnosis not present

## 2015-03-29 DIAGNOSIS — K269 Duodenal ulcer, unspecified as acute or chronic, without hemorrhage or perforation: Secondary | ICD-10-CM | POA: Diagnosis not present

## 2015-03-29 DIAGNOSIS — I10 Essential (primary) hypertension: Secondary | ICD-10-CM | POA: Diagnosis not present

## 2015-03-29 DIAGNOSIS — K50813 Crohn's disease of both small and large intestine with fistula: Secondary | ICD-10-CM | POA: Diagnosis not present

## 2015-03-29 DIAGNOSIS — J9600 Acute respiratory failure, unspecified whether with hypoxia or hypercapnia: Secondary | ICD-10-CM | POA: Diagnosis not present

## 2015-03-30 DIAGNOSIS — K769 Liver disease, unspecified: Secondary | ICD-10-CM | POA: Diagnosis not present

## 2015-03-30 DIAGNOSIS — K269 Duodenal ulcer, unspecified as acute or chronic, without hemorrhage or perforation: Secondary | ICD-10-CM | POA: Diagnosis not present

## 2015-03-30 DIAGNOSIS — J9600 Acute respiratory failure, unspecified whether with hypoxia or hypercapnia: Secondary | ICD-10-CM | POA: Diagnosis not present

## 2015-03-30 DIAGNOSIS — K50813 Crohn's disease of both small and large intestine with fistula: Secondary | ICD-10-CM | POA: Diagnosis not present

## 2015-03-30 DIAGNOSIS — I10 Essential (primary) hypertension: Secondary | ICD-10-CM | POA: Diagnosis not present

## 2015-03-31 DIAGNOSIS — I10 Essential (primary) hypertension: Secondary | ICD-10-CM | POA: Diagnosis not present

## 2015-03-31 DIAGNOSIS — K769 Liver disease, unspecified: Secondary | ICD-10-CM | POA: Diagnosis not present

## 2015-03-31 DIAGNOSIS — K269 Duodenal ulcer, unspecified as acute or chronic, without hemorrhage or perforation: Secondary | ICD-10-CM | POA: Diagnosis not present

## 2015-03-31 DIAGNOSIS — K50813 Crohn's disease of both small and large intestine with fistula: Secondary | ICD-10-CM | POA: Diagnosis not present

## 2015-03-31 DIAGNOSIS — J9600 Acute respiratory failure, unspecified whether with hypoxia or hypercapnia: Secondary | ICD-10-CM | POA: Diagnosis not present

## 2015-04-01 ENCOUNTER — Telehealth (INDEPENDENT_AMBULATORY_CARE_PROVIDER_SITE_OTHER): Payer: Self-pay | Admitting: *Deleted

## 2015-04-01 ENCOUNTER — Other Ambulatory Visit (INDEPENDENT_AMBULATORY_CARE_PROVIDER_SITE_OTHER): Payer: Self-pay | Admitting: Internal Medicine

## 2015-04-01 DIAGNOSIS — Z5181 Encounter for therapeutic drug level monitoring: Secondary | ICD-10-CM | POA: Diagnosis not present

## 2015-04-01 DIAGNOSIS — K269 Duodenal ulcer, unspecified as acute or chronic, without hemorrhage or perforation: Secondary | ICD-10-CM | POA: Diagnosis not present

## 2015-04-01 DIAGNOSIS — K769 Liver disease, unspecified: Secondary | ICD-10-CM | POA: Diagnosis not present

## 2015-04-01 DIAGNOSIS — Z452 Encounter for adjustment and management of vascular access device: Secondary | ICD-10-CM | POA: Diagnosis not present

## 2015-04-01 DIAGNOSIS — K5090 Crohn's disease, unspecified, without complications: Secondary | ICD-10-CM | POA: Diagnosis not present

## 2015-04-01 DIAGNOSIS — J189 Pneumonia, unspecified organism: Secondary | ICD-10-CM | POA: Diagnosis not present

## 2015-04-01 DIAGNOSIS — K50813 Crohn's disease of both small and large intestine with fistula: Secondary | ICD-10-CM | POA: Diagnosis not present

## 2015-04-01 DIAGNOSIS — I10 Essential (primary) hypertension: Secondary | ICD-10-CM | POA: Diagnosis not present

## 2015-04-01 DIAGNOSIS — J9600 Acute respiratory failure, unspecified whether with hypoxia or hypercapnia: Secondary | ICD-10-CM | POA: Diagnosis not present

## 2015-04-01 MED ORDER — NYSTATIN-TRIAMCINOLONE 100000-0.1 UNIT/GM-% EX CREA: 1.0000 "application " | TOPICAL_CREAM | Freq: Two times a day (BID) | CUTANEOUS | Status: DC

## 2015-04-01 NOTE — Telephone Encounter (Signed)
Dr.Rehman wanted to talk with the Home health Nurse.

## 2015-04-01 NOTE — Telephone Encounter (Signed)
David [name] from [facility] said the site around the drain tub looks infected. David [name] is also out of the Nystatin cream. Her return phone number is [phone].

## 2015-04-02 DIAGNOSIS — K769 Liver disease, unspecified: Secondary | ICD-10-CM | POA: Diagnosis not present

## 2015-04-02 DIAGNOSIS — I10 Essential (primary) hypertension: Secondary | ICD-10-CM | POA: Diagnosis not present

## 2015-04-02 DIAGNOSIS — K269 Duodenal ulcer, unspecified as acute or chronic, without hemorrhage or perforation: Secondary | ICD-10-CM | POA: Diagnosis not present

## 2015-04-02 DIAGNOSIS — J9600 Acute respiratory failure, unspecified whether with hypoxia or hypercapnia: Secondary | ICD-10-CM | POA: Diagnosis not present

## 2015-04-02 DIAGNOSIS — K50813 Crohn's disease of both small and large intestine with fistula: Secondary | ICD-10-CM | POA: Diagnosis not present

## 2015-04-02 NOTE — Telephone Encounter (Signed)
Prescription for Mycolog-II cream sent to his pharmacy yesterday

## 2015-04-03 DIAGNOSIS — Z789 Other specified health status: Secondary | ICD-10-CM | POA: Diagnosis not present

## 2015-04-03 DIAGNOSIS — R918 Other nonspecific abnormal finding of lung field: Secondary | ICD-10-CM | POA: Diagnosis not present

## 2015-04-03 DIAGNOSIS — Z79899 Other long term (current) drug therapy: Secondary | ICD-10-CM | POA: Diagnosis not present

## 2015-04-03 DIAGNOSIS — I10 Essential (primary) hypertension: Secondary | ICD-10-CM | POA: Diagnosis not present

## 2015-04-03 DIAGNOSIS — Z7682 Awaiting organ transplant status: Secondary | ICD-10-CM | POA: Diagnosis not present

## 2015-04-03 DIAGNOSIS — K769 Liver disease, unspecified: Secondary | ICD-10-CM | POA: Diagnosis not present

## 2015-04-03 DIAGNOSIS — K269 Duodenal ulcer, unspecified as acute or chronic, without hemorrhage or perforation: Secondary | ICD-10-CM | POA: Diagnosis not present

## 2015-04-03 DIAGNOSIS — R634 Abnormal weight loss: Secondary | ICD-10-CM | POA: Diagnosis not present

## 2015-04-03 DIAGNOSIS — Z931 Gastrostomy status: Secondary | ICD-10-CM | POA: Diagnosis not present

## 2015-04-03 DIAGNOSIS — K7460 Unspecified cirrhosis of liver: Secondary | ICD-10-CM | POA: Diagnosis not present

## 2015-04-03 DIAGNOSIS — R1084 Generalized abdominal pain: Secondary | ICD-10-CM | POA: Diagnosis not present

## 2015-04-03 DIAGNOSIS — J9600 Acute respiratory failure, unspecified whether with hypoxia or hypercapnia: Secondary | ICD-10-CM | POA: Diagnosis not present

## 2015-04-03 DIAGNOSIS — K5669 Other intestinal obstruction: Secondary | ICD-10-CM | POA: Diagnosis not present

## 2015-04-03 DIAGNOSIS — K50018 Crohn's disease of small intestine with other complication: Secondary | ICD-10-CM | POA: Diagnosis not present

## 2015-04-03 DIAGNOSIS — F17220 Nicotine dependence, chewing tobacco, uncomplicated: Secondary | ICD-10-CM | POA: Diagnosis not present

## 2015-04-03 DIAGNOSIS — K50813 Crohn's disease of both small and large intestine with fistula: Secondary | ICD-10-CM | POA: Diagnosis not present

## 2015-04-03 DIAGNOSIS — Z452 Encounter for adjustment and management of vascular access device: Secondary | ICD-10-CM | POA: Diagnosis not present

## 2015-04-03 DIAGNOSIS — Z008 Encounter for other general examination: Secondary | ICD-10-CM | POA: Diagnosis not present

## 2015-04-03 DIAGNOSIS — F1190 Opioid use, unspecified, uncomplicated: Secondary | ICD-10-CM | POA: Diagnosis not present

## 2015-04-03 DIAGNOSIS — F331 Major depressive disorder, recurrent, moderate: Secondary | ICD-10-CM | POA: Diagnosis not present

## 2015-04-03 DIAGNOSIS — Z4682 Encounter for fitting and adjustment of non-vascular catheter: Secondary | ICD-10-CM | POA: Diagnosis not present

## 2015-04-04 DIAGNOSIS — K50813 Crohn's disease of both small and large intestine with fistula: Secondary | ICD-10-CM | POA: Diagnosis not present

## 2015-04-04 DIAGNOSIS — J9600 Acute respiratory failure, unspecified whether with hypoxia or hypercapnia: Secondary | ICD-10-CM | POA: Diagnosis not present

## 2015-04-04 DIAGNOSIS — K269 Duodenal ulcer, unspecified as acute or chronic, without hemorrhage or perforation: Secondary | ICD-10-CM | POA: Diagnosis not present

## 2015-04-04 DIAGNOSIS — I10 Essential (primary) hypertension: Secondary | ICD-10-CM | POA: Diagnosis not present

## 2015-04-04 DIAGNOSIS — K769 Liver disease, unspecified: Secondary | ICD-10-CM | POA: Diagnosis not present

## 2015-04-05 DIAGNOSIS — K769 Liver disease, unspecified: Secondary | ICD-10-CM | POA: Diagnosis not present

## 2015-04-05 DIAGNOSIS — K50813 Crohn's disease of both small and large intestine with fistula: Secondary | ICD-10-CM | POA: Diagnosis not present

## 2015-04-05 DIAGNOSIS — J9600 Acute respiratory failure, unspecified whether with hypoxia or hypercapnia: Secondary | ICD-10-CM | POA: Diagnosis not present

## 2015-04-05 DIAGNOSIS — K269 Duodenal ulcer, unspecified as acute or chronic, without hemorrhage or perforation: Secondary | ICD-10-CM | POA: Diagnosis not present

## 2015-04-05 DIAGNOSIS — I10 Essential (primary) hypertension: Secondary | ICD-10-CM | POA: Diagnosis not present

## 2015-04-06 DIAGNOSIS — I10 Essential (primary) hypertension: Secondary | ICD-10-CM | POA: Diagnosis not present

## 2015-04-06 DIAGNOSIS — K269 Duodenal ulcer, unspecified as acute or chronic, without hemorrhage or perforation: Secondary | ICD-10-CM | POA: Diagnosis not present

## 2015-04-06 DIAGNOSIS — K769 Liver disease, unspecified: Secondary | ICD-10-CM | POA: Diagnosis not present

## 2015-04-06 DIAGNOSIS — J9600 Acute respiratory failure, unspecified whether with hypoxia or hypercapnia: Secondary | ICD-10-CM | POA: Diagnosis not present

## 2015-04-06 DIAGNOSIS — K50813 Crohn's disease of both small and large intestine with fistula: Secondary | ICD-10-CM | POA: Diagnosis not present

## 2015-04-07 DIAGNOSIS — K269 Duodenal ulcer, unspecified as acute or chronic, without hemorrhage or perforation: Secondary | ICD-10-CM | POA: Diagnosis not present

## 2015-04-07 DIAGNOSIS — J9600 Acute respiratory failure, unspecified whether with hypoxia or hypercapnia: Secondary | ICD-10-CM | POA: Diagnosis not present

## 2015-04-07 DIAGNOSIS — I10 Essential (primary) hypertension: Secondary | ICD-10-CM | POA: Diagnosis not present

## 2015-04-07 DIAGNOSIS — K50813 Crohn's disease of both small and large intestine with fistula: Secondary | ICD-10-CM | POA: Diagnosis not present

## 2015-04-07 DIAGNOSIS — K769 Liver disease, unspecified: Secondary | ICD-10-CM | POA: Diagnosis not present

## 2015-04-08 ENCOUNTER — Telehealth: Payer: Self-pay | Admitting: *Deleted

## 2015-04-08 ENCOUNTER — Other Ambulatory Visit: Payer: Self-pay | Admitting: *Deleted

## 2015-04-08 ENCOUNTER — Telehealth (INDEPENDENT_AMBULATORY_CARE_PROVIDER_SITE_OTHER): Payer: Self-pay | Admitting: *Deleted

## 2015-04-08 ENCOUNTER — Telehealth: Payer: Self-pay | Admitting: Family Medicine

## 2015-04-08 DIAGNOSIS — J9600 Acute respiratory failure, unspecified whether with hypoxia or hypercapnia: Secondary | ICD-10-CM | POA: Diagnosis not present

## 2015-04-08 DIAGNOSIS — Z452 Encounter for adjustment and management of vascular access device: Secondary | ICD-10-CM | POA: Diagnosis not present

## 2015-04-08 DIAGNOSIS — K50813 Crohn's disease of both small and large intestine with fistula: Secondary | ICD-10-CM | POA: Diagnosis not present

## 2015-04-08 DIAGNOSIS — K269 Duodenal ulcer, unspecified as acute or chronic, without hemorrhage or perforation: Secondary | ICD-10-CM | POA: Diagnosis not present

## 2015-04-08 DIAGNOSIS — Z5181 Encounter for therapeutic drug level monitoring: Secondary | ICD-10-CM | POA: Diagnosis not present

## 2015-04-08 DIAGNOSIS — K50912 Crohn's disease, unspecified, with intestinal obstruction: Secondary | ICD-10-CM | POA: Diagnosis not present

## 2015-04-08 DIAGNOSIS — I10 Essential (primary) hypertension: Secondary | ICD-10-CM | POA: Diagnosis not present

## 2015-04-08 DIAGNOSIS — K5090 Crohn's disease, unspecified, without complications: Secondary | ICD-10-CM | POA: Diagnosis not present

## 2015-04-08 DIAGNOSIS — K769 Liver disease, unspecified: Secondary | ICD-10-CM | POA: Diagnosis not present

## 2015-04-08 NOTE — Telephone Encounter (Signed)
Noted and this will be addressed with Dr.Rehman 04/10/15.

## 2015-04-08 NOTE — Telephone Encounter (Signed)
Talked with David [name] and she is going to call the patient's PCP and see if he will go ahead and arrange the O2. Will need TPN continuation from Dr.Rehman.

## 2015-04-08 NOTE — Telephone Encounter (Signed)
Patient was recently seen in the hospital for pneumonia and he has been SOB since then.  Nurse Wyatt Mage from [facility] is requesting oxygen to be ordered. Please advise.

## 2015-04-08 NOTE — Telephone Encounter (Signed)
Discussed with tabitha home health nurse to call Dr. Karilyn Cota to get ok on home health. Script for O2 [facility] to [facility] fax #[phone]

## 2015-04-08 NOTE — Telephone Encounter (Signed)
[name] home health nurse calling to get verbal order for oxygen and to continue home heatlh. Pt has pic line. His o2 today 93. Sob when moving around. Was at [facility] 2 weeks ago dx with pneumonia. Finished antibiotic. No fever. Spitting up clear mucus.

## 2015-04-08 NOTE — Telephone Encounter (Signed)
David [name] from [facility] called and said David [name]'s orders to see him has ran out. He is also having trouble getting his breath at times and is wanting oxygen. Her return phone number is [phone].

## 2015-04-08 NOTE — Telephone Encounter (Signed)
This phone message went into another chart??? Unable to hit review, plz change if poosible

## 2015-04-08 NOTE — Telephone Encounter (Signed)
At [facility] a couple of weeks ago. Dx pneumonia. Finished antibiotic. No fever. Spitting up clear mucus.  Sob when moving around. Requesting order for oxygen prn. o2 today 93. Also needs verbal to continue home health he has a pic line

## 2015-04-08 NOTE — Telephone Encounter (Signed)
Message added to correct chart

## 2015-04-08 NOTE — Telephone Encounter (Signed)
O2 ok and sure as far as ongoing h h, if any GI or hyperal concerns, need to take it up with Dr Karilyn Cota who originally ordered H H

## 2015-04-09 DIAGNOSIS — K50813 Crohn's disease of both small and large intestine with fistula: Secondary | ICD-10-CM | POA: Diagnosis not present

## 2015-04-09 DIAGNOSIS — K769 Liver disease, unspecified: Secondary | ICD-10-CM | POA: Diagnosis not present

## 2015-04-09 DIAGNOSIS — K269 Duodenal ulcer, unspecified as acute or chronic, without hemorrhage or perforation: Secondary | ICD-10-CM | POA: Diagnosis not present

## 2015-04-09 DIAGNOSIS — J9600 Acute respiratory failure, unspecified whether with hypoxia or hypercapnia: Secondary | ICD-10-CM | POA: Diagnosis not present

## 2015-04-09 DIAGNOSIS — I10 Essential (primary) hypertension: Secondary | ICD-10-CM | POA: Diagnosis not present

## 2015-04-09 NOTE — Telephone Encounter (Signed)
Dr.Rehman was made aware. 

## 2015-04-10 ENCOUNTER — Telehealth: Payer: Self-pay | Admitting: Family Medicine

## 2015-04-10 DIAGNOSIS — K269 Duodenal ulcer, unspecified as acute or chronic, without hemorrhage or perforation: Secondary | ICD-10-CM | POA: Diagnosis not present

## 2015-04-10 DIAGNOSIS — K50813 Crohn's disease of both small and large intestine with fistula: Secondary | ICD-10-CM | POA: Diagnosis not present

## 2015-04-10 DIAGNOSIS — Z452 Encounter for adjustment and management of vascular access device: Secondary | ICD-10-CM | POA: Diagnosis not present

## 2015-04-10 DIAGNOSIS — I10 Essential (primary) hypertension: Secondary | ICD-10-CM | POA: Diagnosis not present

## 2015-04-10 DIAGNOSIS — K769 Liver disease, unspecified: Secondary | ICD-10-CM | POA: Diagnosis not present

## 2015-04-10 DIAGNOSIS — K50912 Crohn's disease, unspecified, with intestinal obstruction: Secondary | ICD-10-CM | POA: Diagnosis not present

## 2015-04-10 DIAGNOSIS — J9600 Acute respiratory failure, unspecified whether with hypoxia or hypercapnia: Secondary | ICD-10-CM | POA: Diagnosis not present

## 2015-04-10 NOTE — Telephone Encounter (Signed)
Thereasa Distance with [facility] called needing pt's O2 sat results at rest on room air and last office note.  Last office note here is 02/12/15 and is too old to use and we do not have O2 sat at rest on room air   Oxygen was ordered for this patient per the request of his home health nurse Wyatt Mage with [facility] - [phone]) due to SOB and recent inpatient at [facility] with pneumonia  In order for patient to issued this oxygen order, he must have a O2 sat result at rest on room air below 88% in order to qualify due to his insurance guidelines (the O2 sat results that the home health nurse reported to [place] of 93 not only is too high but not usable since it was done by the same company)  Please advise, ? NTBS ?  Rodeny @ [facility] Ph# [phone] ext [phone], fax# [phone]

## 2015-04-10 NOTE — Telephone Encounter (Signed)
Discussed with Thereasa Distance from [facility].

## 2015-04-10 NOTE — Telephone Encounter (Signed)
Have nurse ck O2 sat daily next two to three d, ia numbers above threshold will not be able to precribe

## 2015-04-11 ENCOUNTER — Encounter (INDEPENDENT_AMBULATORY_CARE_PROVIDER_SITE_OTHER): Payer: Self-pay

## 2015-04-11 DIAGNOSIS — K50813 Crohn's disease of both small and large intestine with fistula: Secondary | ICD-10-CM | POA: Diagnosis not present

## 2015-04-11 DIAGNOSIS — J9600 Acute respiratory failure, unspecified whether with hypoxia or hypercapnia: Secondary | ICD-10-CM | POA: Diagnosis not present

## 2015-04-11 DIAGNOSIS — I10 Essential (primary) hypertension: Secondary | ICD-10-CM | POA: Diagnosis not present

## 2015-04-11 DIAGNOSIS — K269 Duodenal ulcer, unspecified as acute or chronic, without hemorrhage or perforation: Secondary | ICD-10-CM | POA: Diagnosis not present

## 2015-04-11 DIAGNOSIS — K769 Liver disease, unspecified: Secondary | ICD-10-CM | POA: Diagnosis not present

## 2015-04-12 DIAGNOSIS — K269 Duodenal ulcer, unspecified as acute or chronic, without hemorrhage or perforation: Secondary | ICD-10-CM | POA: Diagnosis not present

## 2015-04-12 DIAGNOSIS — K769 Liver disease, unspecified: Secondary | ICD-10-CM | POA: Diagnosis not present

## 2015-04-12 DIAGNOSIS — J9600 Acute respiratory failure, unspecified whether with hypoxia or hypercapnia: Secondary | ICD-10-CM | POA: Diagnosis not present

## 2015-04-12 DIAGNOSIS — K50813 Crohn's disease of both small and large intestine with fistula: Secondary | ICD-10-CM | POA: Diagnosis not present

## 2015-04-12 DIAGNOSIS — I10 Essential (primary) hypertension: Secondary | ICD-10-CM | POA: Diagnosis not present

## 2015-04-13 DIAGNOSIS — K50813 Crohn's disease of both small and large intestine with fistula: Secondary | ICD-10-CM | POA: Diagnosis not present

## 2015-04-13 DIAGNOSIS — J9600 Acute respiratory failure, unspecified whether with hypoxia or hypercapnia: Secondary | ICD-10-CM | POA: Diagnosis not present

## 2015-04-13 DIAGNOSIS — I10 Essential (primary) hypertension: Secondary | ICD-10-CM | POA: Diagnosis not present

## 2015-04-13 DIAGNOSIS — K269 Duodenal ulcer, unspecified as acute or chronic, without hemorrhage or perforation: Secondary | ICD-10-CM | POA: Diagnosis not present

## 2015-04-13 DIAGNOSIS — K769 Liver disease, unspecified: Secondary | ICD-10-CM | POA: Diagnosis not present

## 2015-04-14 DIAGNOSIS — J9600 Acute respiratory failure, unspecified whether with hypoxia or hypercapnia: Secondary | ICD-10-CM | POA: Diagnosis not present

## 2015-04-14 DIAGNOSIS — K50813 Crohn's disease of both small and large intestine with fistula: Secondary | ICD-10-CM | POA: Diagnosis not present

## 2015-04-14 DIAGNOSIS — I10 Essential (primary) hypertension: Secondary | ICD-10-CM | POA: Diagnosis not present

## 2015-04-14 DIAGNOSIS — K769 Liver disease, unspecified: Secondary | ICD-10-CM | POA: Diagnosis not present

## 2015-04-14 DIAGNOSIS — K269 Duodenal ulcer, unspecified as acute or chronic, without hemorrhage or perforation: Secondary | ICD-10-CM | POA: Diagnosis not present

## 2015-04-15 DIAGNOSIS — I10 Essential (primary) hypertension: Secondary | ICD-10-CM | POA: Diagnosis not present

## 2015-04-15 DIAGNOSIS — K3184 Gastroparesis: Secondary | ICD-10-CM | POA: Diagnosis not present

## 2015-04-15 DIAGNOSIS — K769 Liver disease, unspecified: Secondary | ICD-10-CM | POA: Diagnosis not present

## 2015-04-15 DIAGNOSIS — Z5181 Encounter for therapeutic drug level monitoring: Secondary | ICD-10-CM | POA: Diagnosis not present

## 2015-04-15 DIAGNOSIS — Z452 Encounter for adjustment and management of vascular access device: Secondary | ICD-10-CM | POA: Diagnosis not present

## 2015-04-15 DIAGNOSIS — K5090 Crohn's disease, unspecified, without complications: Secondary | ICD-10-CM | POA: Diagnosis not present

## 2015-04-15 DIAGNOSIS — Z4801 Encounter for change or removal of surgical wound dressing: Secondary | ICD-10-CM | POA: Diagnosis not present

## 2015-04-15 DIAGNOSIS — K50912 Crohn's disease, unspecified, with intestinal obstruction: Secondary | ICD-10-CM | POA: Diagnosis not present

## 2015-04-15 DIAGNOSIS — K269 Duodenal ulcer, unspecified as acute or chronic, without hemorrhage or perforation: Secondary | ICD-10-CM | POA: Diagnosis not present

## 2015-04-15 DIAGNOSIS — J9600 Acute respiratory failure, unspecified whether with hypoxia or hypercapnia: Secondary | ICD-10-CM | POA: Diagnosis not present

## 2015-04-15 DIAGNOSIS — K50813 Crohn's disease of both small and large intestine with fistula: Secondary | ICD-10-CM | POA: Diagnosis not present

## 2015-04-16 DIAGNOSIS — J9600 Acute respiratory failure, unspecified whether with hypoxia or hypercapnia: Secondary | ICD-10-CM | POA: Diagnosis not present

## 2015-04-16 DIAGNOSIS — I10 Essential (primary) hypertension: Secondary | ICD-10-CM | POA: Diagnosis not present

## 2015-04-16 DIAGNOSIS — K769 Liver disease, unspecified: Secondary | ICD-10-CM | POA: Diagnosis not present

## 2015-04-16 DIAGNOSIS — K269 Duodenal ulcer, unspecified as acute or chronic, without hemorrhage or perforation: Secondary | ICD-10-CM | POA: Diagnosis not present

## 2015-04-16 DIAGNOSIS — K50813 Crohn's disease of both small and large intestine with fistula: Secondary | ICD-10-CM | POA: Diagnosis not present

## 2015-04-17 DIAGNOSIS — J9600 Acute respiratory failure, unspecified whether with hypoxia or hypercapnia: Secondary | ICD-10-CM | POA: Diagnosis not present

## 2015-04-17 DIAGNOSIS — R918 Other nonspecific abnormal finding of lung field: Secondary | ICD-10-CM | POA: Diagnosis not present

## 2015-04-17 DIAGNOSIS — K50813 Crohn's disease of both small and large intestine with fistula: Secondary | ICD-10-CM | POA: Diagnosis not present

## 2015-04-17 DIAGNOSIS — J189 Pneumonia, unspecified organism: Secondary | ICD-10-CM | POA: Diagnosis not present

## 2015-04-17 DIAGNOSIS — Z789 Other specified health status: Secondary | ICD-10-CM | POA: Diagnosis not present

## 2015-04-17 DIAGNOSIS — K639 Disease of intestine, unspecified: Secondary | ICD-10-CM | POA: Diagnosis not present

## 2015-04-17 DIAGNOSIS — I10 Essential (primary) hypertension: Secondary | ICD-10-CM | POA: Diagnosis not present

## 2015-04-17 DIAGNOSIS — Z7682 Awaiting organ transplant status: Secondary | ICD-10-CM | POA: Diagnosis not present

## 2015-04-17 DIAGNOSIS — J690 Pneumonitis due to inhalation of food and vomit: Secondary | ICD-10-CM | POA: Diagnosis not present

## 2015-04-17 DIAGNOSIS — K7460 Unspecified cirrhosis of liver: Secondary | ICD-10-CM | POA: Diagnosis not present

## 2015-04-17 DIAGNOSIS — K3184 Gastroparesis: Secondary | ICD-10-CM | POA: Diagnosis not present

## 2015-04-17 DIAGNOSIS — K50818 Crohn's disease of both small and large intestine with other complication: Secondary | ICD-10-CM | POA: Diagnosis not present

## 2015-04-17 DIAGNOSIS — K269 Duodenal ulcer, unspecified as acute or chronic, without hemorrhage or perforation: Secondary | ICD-10-CM | POA: Diagnosis not present

## 2015-04-17 DIAGNOSIS — Z7952 Long term (current) use of systemic steroids: Secondary | ICD-10-CM | POA: Diagnosis not present

## 2015-04-17 DIAGNOSIS — K7469 Other cirrhosis of liver: Secondary | ICD-10-CM | POA: Diagnosis not present

## 2015-04-17 DIAGNOSIS — K50819 Crohn's disease of both small and large intestine with unspecified complications: Secondary | ICD-10-CM | POA: Diagnosis not present

## 2015-04-17 DIAGNOSIS — R188 Other ascites: Secondary | ICD-10-CM | POA: Diagnosis not present

## 2015-04-17 DIAGNOSIS — K769 Liver disease, unspecified: Secondary | ICD-10-CM | POA: Diagnosis not present

## 2015-04-17 DIAGNOSIS — A310 Pulmonary mycobacterial infection: Secondary | ICD-10-CM | POA: Diagnosis not present

## 2015-04-17 DIAGNOSIS — A319 Mycobacterial infection, unspecified: Secondary | ICD-10-CM | POA: Diagnosis not present

## 2015-04-18 DIAGNOSIS — J9600 Acute respiratory failure, unspecified whether with hypoxia or hypercapnia: Secondary | ICD-10-CM | POA: Diagnosis not present

## 2015-04-18 DIAGNOSIS — I10 Essential (primary) hypertension: Secondary | ICD-10-CM | POA: Diagnosis not present

## 2015-04-18 DIAGNOSIS — K50813 Crohn's disease of both small and large intestine with fistula: Secondary | ICD-10-CM | POA: Diagnosis not present

## 2015-04-18 DIAGNOSIS — K769 Liver disease, unspecified: Secondary | ICD-10-CM | POA: Diagnosis not present

## 2015-04-18 DIAGNOSIS — K269 Duodenal ulcer, unspecified as acute or chronic, without hemorrhage or perforation: Secondary | ICD-10-CM | POA: Diagnosis not present

## 2015-04-19 DIAGNOSIS — K50813 Crohn's disease of both small and large intestine with fistula: Secondary | ICD-10-CM | POA: Diagnosis not present

## 2015-04-19 DIAGNOSIS — K769 Liver disease, unspecified: Secondary | ICD-10-CM | POA: Diagnosis not present

## 2015-04-19 DIAGNOSIS — I10 Essential (primary) hypertension: Secondary | ICD-10-CM | POA: Diagnosis not present

## 2015-04-19 DIAGNOSIS — K269 Duodenal ulcer, unspecified as acute or chronic, without hemorrhage or perforation: Secondary | ICD-10-CM | POA: Diagnosis not present

## 2015-04-19 DIAGNOSIS — J9600 Acute respiratory failure, unspecified whether with hypoxia or hypercapnia: Secondary | ICD-10-CM | POA: Diagnosis not present

## 2015-04-20 DIAGNOSIS — I10 Essential (primary) hypertension: Secondary | ICD-10-CM | POA: Diagnosis not present

## 2015-04-20 DIAGNOSIS — J9600 Acute respiratory failure, unspecified whether with hypoxia or hypercapnia: Secondary | ICD-10-CM | POA: Diagnosis not present

## 2015-04-20 DIAGNOSIS — K769 Liver disease, unspecified: Secondary | ICD-10-CM | POA: Diagnosis not present

## 2015-04-20 DIAGNOSIS — K50813 Crohn's disease of both small and large intestine with fistula: Secondary | ICD-10-CM | POA: Diagnosis not present

## 2015-04-20 DIAGNOSIS — K269 Duodenal ulcer, unspecified as acute or chronic, without hemorrhage or perforation: Secondary | ICD-10-CM | POA: Diagnosis not present

## 2015-04-21 DIAGNOSIS — K769 Liver disease, unspecified: Secondary | ICD-10-CM | POA: Diagnosis not present

## 2015-04-21 DIAGNOSIS — K269 Duodenal ulcer, unspecified as acute or chronic, without hemorrhage or perforation: Secondary | ICD-10-CM | POA: Diagnosis not present

## 2015-04-21 DIAGNOSIS — I10 Essential (primary) hypertension: Secondary | ICD-10-CM | POA: Diagnosis not present

## 2015-04-21 DIAGNOSIS — J9600 Acute respiratory failure, unspecified whether with hypoxia or hypercapnia: Secondary | ICD-10-CM | POA: Diagnosis not present

## 2015-04-21 DIAGNOSIS — K50813 Crohn's disease of both small and large intestine with fistula: Secondary | ICD-10-CM | POA: Diagnosis not present

## 2015-04-22 ENCOUNTER — Encounter (INDEPENDENT_AMBULATORY_CARE_PROVIDER_SITE_OTHER): Payer: Self-pay

## 2015-04-22 DIAGNOSIS — K269 Duodenal ulcer, unspecified as acute or chronic, without hemorrhage or perforation: Secondary | ICD-10-CM | POA: Diagnosis not present

## 2015-04-22 DIAGNOSIS — K50912 Crohn's disease, unspecified, with intestinal obstruction: Secondary | ICD-10-CM | POA: Diagnosis not present

## 2015-04-22 DIAGNOSIS — K769 Liver disease, unspecified: Secondary | ICD-10-CM | POA: Diagnosis not present

## 2015-04-22 DIAGNOSIS — J9600 Acute respiratory failure, unspecified whether with hypoxia or hypercapnia: Secondary | ICD-10-CM | POA: Diagnosis not present

## 2015-04-22 DIAGNOSIS — K50813 Crohn's disease of both small and large intestine with fistula: Secondary | ICD-10-CM | POA: Diagnosis not present

## 2015-04-22 DIAGNOSIS — K5090 Crohn's disease, unspecified, without complications: Secondary | ICD-10-CM | POA: Diagnosis not present

## 2015-04-22 DIAGNOSIS — I10 Essential (primary) hypertension: Secondary | ICD-10-CM | POA: Diagnosis not present

## 2015-04-22 DIAGNOSIS — Z452 Encounter for adjustment and management of vascular access device: Secondary | ICD-10-CM | POA: Diagnosis not present

## 2015-04-22 DIAGNOSIS — Z5181 Encounter for therapeutic drug level monitoring: Secondary | ICD-10-CM | POA: Diagnosis not present

## 2015-04-23 DIAGNOSIS — K769 Liver disease, unspecified: Secondary | ICD-10-CM | POA: Diagnosis not present

## 2015-04-23 DIAGNOSIS — I10 Essential (primary) hypertension: Secondary | ICD-10-CM | POA: Diagnosis not present

## 2015-04-23 DIAGNOSIS — J9600 Acute respiratory failure, unspecified whether with hypoxia or hypercapnia: Secondary | ICD-10-CM | POA: Diagnosis not present

## 2015-04-23 DIAGNOSIS — K50813 Crohn's disease of both small and large intestine with fistula: Secondary | ICD-10-CM | POA: Diagnosis not present

## 2015-04-23 DIAGNOSIS — K269 Duodenal ulcer, unspecified as acute or chronic, without hemorrhage or perforation: Secondary | ICD-10-CM | POA: Diagnosis not present

## 2015-04-24 DIAGNOSIS — K269 Duodenal ulcer, unspecified as acute or chronic, without hemorrhage or perforation: Secondary | ICD-10-CM | POA: Diagnosis not present

## 2015-04-24 DIAGNOSIS — K769 Liver disease, unspecified: Secondary | ICD-10-CM | POA: Diagnosis not present

## 2015-04-24 DIAGNOSIS — I10 Essential (primary) hypertension: Secondary | ICD-10-CM | POA: Diagnosis not present

## 2015-04-24 DIAGNOSIS — K50813 Crohn's disease of both small and large intestine with fistula: Secondary | ICD-10-CM | POA: Diagnosis not present

## 2015-04-24 DIAGNOSIS — J9600 Acute respiratory failure, unspecified whether with hypoxia or hypercapnia: Secondary | ICD-10-CM | POA: Diagnosis not present

## 2015-04-25 DIAGNOSIS — I10 Essential (primary) hypertension: Secondary | ICD-10-CM | POA: Diagnosis not present

## 2015-04-25 DIAGNOSIS — K269 Duodenal ulcer, unspecified as acute or chronic, without hemorrhage or perforation: Secondary | ICD-10-CM | POA: Diagnosis not present

## 2015-04-25 DIAGNOSIS — K769 Liver disease, unspecified: Secondary | ICD-10-CM | POA: Diagnosis not present

## 2015-04-25 DIAGNOSIS — K50813 Crohn's disease of both small and large intestine with fistula: Secondary | ICD-10-CM | POA: Diagnosis not present

## 2015-04-25 DIAGNOSIS — J9600 Acute respiratory failure, unspecified whether with hypoxia or hypercapnia: Secondary | ICD-10-CM | POA: Diagnosis not present

## 2015-04-26 DIAGNOSIS — K769 Liver disease, unspecified: Secondary | ICD-10-CM | POA: Diagnosis not present

## 2015-04-26 DIAGNOSIS — J9600 Acute respiratory failure, unspecified whether with hypoxia or hypercapnia: Secondary | ICD-10-CM | POA: Diagnosis not present

## 2015-04-26 DIAGNOSIS — K269 Duodenal ulcer, unspecified as acute or chronic, without hemorrhage or perforation: Secondary | ICD-10-CM | POA: Diagnosis not present

## 2015-04-26 DIAGNOSIS — I10 Essential (primary) hypertension: Secondary | ICD-10-CM | POA: Diagnosis not present

## 2015-04-26 DIAGNOSIS — K50813 Crohn's disease of both small and large intestine with fistula: Secondary | ICD-10-CM | POA: Diagnosis not present

## 2015-04-27 DIAGNOSIS — I10 Essential (primary) hypertension: Secondary | ICD-10-CM | POA: Diagnosis not present

## 2015-04-27 DIAGNOSIS — K769 Liver disease, unspecified: Secondary | ICD-10-CM | POA: Diagnosis not present

## 2015-04-27 DIAGNOSIS — K50813 Crohn's disease of both small and large intestine with fistula: Secondary | ICD-10-CM | POA: Diagnosis not present

## 2015-04-27 DIAGNOSIS — J9600 Acute respiratory failure, unspecified whether with hypoxia or hypercapnia: Secondary | ICD-10-CM | POA: Diagnosis not present

## 2015-04-27 DIAGNOSIS — K269 Duodenal ulcer, unspecified as acute or chronic, without hemorrhage or perforation: Secondary | ICD-10-CM | POA: Diagnosis not present

## 2015-04-28 DIAGNOSIS — K769 Liver disease, unspecified: Secondary | ICD-10-CM | POA: Diagnosis not present

## 2015-04-28 DIAGNOSIS — K269 Duodenal ulcer, unspecified as acute or chronic, without hemorrhage or perforation: Secondary | ICD-10-CM | POA: Diagnosis not present

## 2015-04-28 DIAGNOSIS — J9600 Acute respiratory failure, unspecified whether with hypoxia or hypercapnia: Secondary | ICD-10-CM | POA: Diagnosis not present

## 2015-04-28 DIAGNOSIS — I10 Essential (primary) hypertension: Secondary | ICD-10-CM | POA: Diagnosis not present

## 2015-04-28 DIAGNOSIS — K50813 Crohn's disease of both small and large intestine with fistula: Secondary | ICD-10-CM | POA: Diagnosis not present

## 2015-04-29 DIAGNOSIS — K50813 Crohn's disease of both small and large intestine with fistula: Secondary | ICD-10-CM | POA: Diagnosis not present

## 2015-04-29 DIAGNOSIS — K5090 Crohn's disease, unspecified, without complications: Secondary | ICD-10-CM | POA: Diagnosis not present

## 2015-04-29 DIAGNOSIS — K50912 Crohn's disease, unspecified, with intestinal obstruction: Secondary | ICD-10-CM | POA: Diagnosis not present

## 2015-04-29 DIAGNOSIS — J9600 Acute respiratory failure, unspecified whether with hypoxia or hypercapnia: Secondary | ICD-10-CM | POA: Diagnosis not present

## 2015-04-29 DIAGNOSIS — Z5181 Encounter for therapeutic drug level monitoring: Secondary | ICD-10-CM | POA: Diagnosis not present

## 2015-04-29 DIAGNOSIS — Z452 Encounter for adjustment and management of vascular access device: Secondary | ICD-10-CM | POA: Diagnosis not present

## 2015-04-29 DIAGNOSIS — K269 Duodenal ulcer, unspecified as acute or chronic, without hemorrhage or perforation: Secondary | ICD-10-CM | POA: Diagnosis not present

## 2015-04-29 DIAGNOSIS — I10 Essential (primary) hypertension: Secondary | ICD-10-CM | POA: Diagnosis not present

## 2015-04-29 DIAGNOSIS — K769 Liver disease, unspecified: Secondary | ICD-10-CM | POA: Diagnosis not present

## 2015-04-30 ENCOUNTER — Encounter (INDEPENDENT_AMBULATORY_CARE_PROVIDER_SITE_OTHER): Payer: Self-pay

## 2015-04-30 ENCOUNTER — Other Ambulatory Visit (INDEPENDENT_AMBULATORY_CARE_PROVIDER_SITE_OTHER): Payer: Self-pay | Admitting: Internal Medicine

## 2015-04-30 DIAGNOSIS — I10 Essential (primary) hypertension: Secondary | ICD-10-CM | POA: Diagnosis not present

## 2015-04-30 DIAGNOSIS — K269 Duodenal ulcer, unspecified as acute or chronic, without hemorrhage or perforation: Secondary | ICD-10-CM | POA: Diagnosis not present

## 2015-04-30 DIAGNOSIS — K50813 Crohn's disease of both small and large intestine with fistula: Secondary | ICD-10-CM | POA: Diagnosis not present

## 2015-04-30 DIAGNOSIS — K769 Liver disease, unspecified: Secondary | ICD-10-CM | POA: Diagnosis not present

## 2015-04-30 DIAGNOSIS — J9600 Acute respiratory failure, unspecified whether with hypoxia or hypercapnia: Secondary | ICD-10-CM | POA: Diagnosis not present

## 2015-04-30 MED ORDER — NYSTATIN 100000 UNIT/GM EX CREA: 1.0000 "application " | TOPICAL_CREAM | Freq: Three times a day (TID) | CUTANEOUS | Status: AC | PRN

## 2015-04-30 MED ORDER — NYSTATIN 100000 UNIT/GM EX CREA: 1.0000 "application " | TOPICAL_CREAM | Freq: Three times a day (TID) | CUTANEOUS | Status: DC | PRN

## 2015-04-30 MED ORDER — PREDNISONE 20 MG PO TABS: 20.0000 mg | ORAL_TABLET | Freq: Every day | ORAL | Status: DC

## 2015-05-01 ENCOUNTER — Telehealth (INDEPENDENT_AMBULATORY_CARE_PROVIDER_SITE_OTHER): Payer: Self-pay | Admitting: *Deleted

## 2015-05-01 DIAGNOSIS — K769 Liver disease, unspecified: Secondary | ICD-10-CM | POA: Diagnosis not present

## 2015-05-01 DIAGNOSIS — K50819 Crohn's disease of both small and large intestine with unspecified complications: Secondary | ICD-10-CM | POA: Diagnosis not present

## 2015-05-01 DIAGNOSIS — Z7682 Awaiting organ transplant status: Secondary | ICD-10-CM | POA: Diagnosis not present

## 2015-05-01 DIAGNOSIS — K5090 Crohn's disease, unspecified, without complications: Secondary | ICD-10-CM | POA: Diagnosis not present

## 2015-05-01 DIAGNOSIS — J9600 Acute respiratory failure, unspecified whether with hypoxia or hypercapnia: Secondary | ICD-10-CM | POA: Diagnosis not present

## 2015-05-01 DIAGNOSIS — J690 Pneumonitis due to inhalation of food and vomit: Secondary | ICD-10-CM | POA: Diagnosis not present

## 2015-05-01 DIAGNOSIS — A310 Pulmonary mycobacterial infection: Secondary | ICD-10-CM | POA: Diagnosis not present

## 2015-05-01 DIAGNOSIS — Z7952 Long term (current) use of systemic steroids: Secondary | ICD-10-CM | POA: Diagnosis not present

## 2015-05-01 DIAGNOSIS — I10 Essential (primary) hypertension: Secondary | ICD-10-CM | POA: Diagnosis not present

## 2015-05-01 DIAGNOSIS — Z5181 Encounter for therapeutic drug level monitoring: Secondary | ICD-10-CM | POA: Diagnosis not present

## 2015-05-01 DIAGNOSIS — Z23 Encounter for immunization: Secondary | ICD-10-CM | POA: Diagnosis not present

## 2015-05-01 DIAGNOSIS — Z792 Long term (current) use of antibiotics: Secondary | ICD-10-CM | POA: Diagnosis not present

## 2015-05-01 DIAGNOSIS — K269 Duodenal ulcer, unspecified as acute or chronic, without hemorrhage or perforation: Secondary | ICD-10-CM | POA: Diagnosis not present

## 2015-05-01 DIAGNOSIS — K50813 Crohn's disease of both small and large intestine with fistula: Secondary | ICD-10-CM | POA: Diagnosis not present

## 2015-05-01 NOTE — Telephone Encounter (Signed)
David [name] was called upon Dr.Rehman's request. He ask that she be made aware that he is okay with the glucose of 153. If they want to add a little more insulin to the TPN that would be okay with him.  David [name] states that she is going to wait until next(Lab) week to see if there is an increase in the glucose. She will then make a decision and let us know.

## 2015-05-02 DIAGNOSIS — I10 Essential (primary) hypertension: Secondary | ICD-10-CM | POA: Diagnosis not present

## 2015-05-02 DIAGNOSIS — J9600 Acute respiratory failure, unspecified whether with hypoxia or hypercapnia: Secondary | ICD-10-CM | POA: Diagnosis not present

## 2015-05-02 DIAGNOSIS — K50813 Crohn's disease of both small and large intestine with fistula: Secondary | ICD-10-CM | POA: Diagnosis not present

## 2015-05-02 DIAGNOSIS — K769 Liver disease, unspecified: Secondary | ICD-10-CM | POA: Diagnosis not present

## 2015-05-02 DIAGNOSIS — K269 Duodenal ulcer, unspecified as acute or chronic, without hemorrhage or perforation: Secondary | ICD-10-CM | POA: Diagnosis not present

## 2015-05-03 DIAGNOSIS — K769 Liver disease, unspecified: Secondary | ICD-10-CM | POA: Diagnosis not present

## 2015-05-03 DIAGNOSIS — J9600 Acute respiratory failure, unspecified whether with hypoxia or hypercapnia: Secondary | ICD-10-CM | POA: Diagnosis not present

## 2015-05-03 DIAGNOSIS — K269 Duodenal ulcer, unspecified as acute or chronic, without hemorrhage or perforation: Secondary | ICD-10-CM | POA: Diagnosis not present

## 2015-05-03 DIAGNOSIS — K50813 Crohn's disease of both small and large intestine with fistula: Secondary | ICD-10-CM | POA: Diagnosis not present

## 2015-05-03 DIAGNOSIS — I10 Essential (primary) hypertension: Secondary | ICD-10-CM | POA: Diagnosis not present

## 2015-05-04 DIAGNOSIS — J9600 Acute respiratory failure, unspecified whether with hypoxia or hypercapnia: Secondary | ICD-10-CM | POA: Diagnosis not present

## 2015-05-04 DIAGNOSIS — K769 Liver disease, unspecified: Secondary | ICD-10-CM | POA: Diagnosis not present

## 2015-05-04 DIAGNOSIS — K269 Duodenal ulcer, unspecified as acute or chronic, without hemorrhage or perforation: Secondary | ICD-10-CM | POA: Diagnosis not present

## 2015-05-04 DIAGNOSIS — I10 Essential (primary) hypertension: Secondary | ICD-10-CM | POA: Diagnosis not present

## 2015-05-04 DIAGNOSIS — K50813 Crohn's disease of both small and large intestine with fistula: Secondary | ICD-10-CM | POA: Diagnosis not present

## 2015-05-05 DIAGNOSIS — K50912 Crohn's disease, unspecified, with intestinal obstruction: Secondary | ICD-10-CM | POA: Diagnosis not present

## 2015-05-05 DIAGNOSIS — Z5181 Encounter for therapeutic drug level monitoring: Secondary | ICD-10-CM | POA: Diagnosis not present

## 2015-05-05 DIAGNOSIS — K5090 Crohn's disease, unspecified, without complications: Secondary | ICD-10-CM | POA: Diagnosis not present

## 2015-05-05 DIAGNOSIS — Z452 Encounter for adjustment and management of vascular access device: Secondary | ICD-10-CM | POA: Diagnosis not present

## 2015-05-06 ENCOUNTER — Encounter (INDEPENDENT_AMBULATORY_CARE_PROVIDER_SITE_OTHER): Payer: Self-pay

## 2015-05-06 DIAGNOSIS — Z931 Gastrostomy status: Secondary | ICD-10-CM | POA: Diagnosis not present

## 2015-05-06 DIAGNOSIS — R609 Edema, unspecified: Secondary | ICD-10-CM | POA: Diagnosis not present

## 2015-05-06 DIAGNOSIS — Z8619 Personal history of other infectious and parasitic diseases: Secondary | ICD-10-CM | POA: Diagnosis not present

## 2015-05-06 DIAGNOSIS — K7460 Unspecified cirrhosis of liver: Secondary | ICD-10-CM | POA: Diagnosis not present

## 2015-05-06 DIAGNOSIS — R9431 Abnormal electrocardiogram [ECG] [EKG]: Secondary | ICD-10-CM | POA: Diagnosis not present

## 2015-05-06 DIAGNOSIS — Z885 Allergy status to narcotic agent status: Secondary | ICD-10-CM | POA: Diagnosis not present

## 2015-05-06 DIAGNOSIS — K5080 Crohn's disease of both small and large intestine without complications: Secondary | ICD-10-CM | POA: Diagnosis not present

## 2015-05-06 DIAGNOSIS — Z888 Allergy status to other drugs, medicaments and biological substances status: Secondary | ICD-10-CM | POA: Diagnosis not present

## 2015-05-06 DIAGNOSIS — Z23 Encounter for immunization: Secondary | ICD-10-CM | POA: Diagnosis not present

## 2015-05-06 DIAGNOSIS — R918 Other nonspecific abnormal finding of lung field: Secondary | ICD-10-CM | POA: Diagnosis not present

## 2015-05-06 DIAGNOSIS — Z79899 Other long term (current) drug therapy: Secondary | ICD-10-CM | POA: Diagnosis not present

## 2015-05-06 DIAGNOSIS — Z789 Other specified health status: Secondary | ICD-10-CM | POA: Diagnosis not present

## 2015-05-06 DIAGNOSIS — Z79891 Long term (current) use of opiate analgesic: Secondary | ICD-10-CM | POA: Diagnosis not present

## 2015-05-06 DIAGNOSIS — A310 Pulmonary mycobacterial infection: Secondary | ICD-10-CM | POA: Diagnosis not present

## 2015-05-06 DIAGNOSIS — Z7682 Awaiting organ transplant status: Secondary | ICD-10-CM | POA: Diagnosis not present

## 2015-05-06 DIAGNOSIS — Z8701 Personal history of pneumonia (recurrent): Secondary | ICD-10-CM | POA: Diagnosis not present

## 2015-05-07 ENCOUNTER — Encounter (INDEPENDENT_AMBULATORY_CARE_PROVIDER_SITE_OTHER): Payer: Self-pay

## 2015-05-07 DIAGNOSIS — Z8701 Personal history of pneumonia (recurrent): Secondary | ICD-10-CM | POA: Diagnosis not present

## 2015-05-07 DIAGNOSIS — R918 Other nonspecific abnormal finding of lung field: Secondary | ICD-10-CM | POA: Diagnosis not present

## 2015-05-07 DIAGNOSIS — F1190 Opioid use, unspecified, uncomplicated: Secondary | ICD-10-CM | POA: Diagnosis not present

## 2015-05-07 DIAGNOSIS — Z789 Other specified health status: Secondary | ICD-10-CM | POA: Diagnosis not present

## 2015-05-07 DIAGNOSIS — K7460 Unspecified cirrhosis of liver: Secondary | ICD-10-CM | POA: Diagnosis not present

## 2015-05-07 DIAGNOSIS — R9431 Abnormal electrocardiogram [ECG] [EKG]: Secondary | ICD-10-CM | POA: Diagnosis not present

## 2015-05-07 DIAGNOSIS — Z79899 Other long term (current) drug therapy: Secondary | ICD-10-CM | POA: Diagnosis not present

## 2015-05-07 DIAGNOSIS — Z931 Gastrostomy status: Secondary | ICD-10-CM | POA: Diagnosis not present

## 2015-05-07 DIAGNOSIS — Z23 Encounter for immunization: Secondary | ICD-10-CM | POA: Diagnosis not present

## 2015-05-07 DIAGNOSIS — Z79891 Long term (current) use of opiate analgesic: Secondary | ICD-10-CM | POA: Diagnosis not present

## 2015-05-07 DIAGNOSIS — Z888 Allergy status to other drugs, medicaments and biological substances status: Secondary | ICD-10-CM | POA: Diagnosis not present

## 2015-05-07 DIAGNOSIS — A310 Pulmonary mycobacterial infection: Secondary | ICD-10-CM | POA: Diagnosis not present

## 2015-05-07 DIAGNOSIS — K5080 Crohn's disease of both small and large intestine without complications: Secondary | ICD-10-CM | POA: Diagnosis not present

## 2015-05-07 DIAGNOSIS — K50918 Crohn's disease, unspecified, with other complication: Secondary | ICD-10-CM | POA: Diagnosis not present

## 2015-05-07 DIAGNOSIS — K50012 Crohn's disease of small intestine with intestinal obstruction: Secondary | ICD-10-CM | POA: Diagnosis not present

## 2015-05-07 DIAGNOSIS — R609 Edema, unspecified: Secondary | ICD-10-CM | POA: Diagnosis not present

## 2015-05-07 DIAGNOSIS — K7469 Other cirrhosis of liver: Secondary | ICD-10-CM | POA: Diagnosis not present

## 2015-05-07 DIAGNOSIS — Z885 Allergy status to narcotic agent status: Secondary | ICD-10-CM | POA: Diagnosis not present

## 2015-05-07 DIAGNOSIS — Z7682 Awaiting organ transplant status: Secondary | ICD-10-CM | POA: Diagnosis not present

## 2015-05-08 DIAGNOSIS — Z452 Encounter for adjustment and management of vascular access device: Secondary | ICD-10-CM | POA: Diagnosis not present

## 2015-05-08 DIAGNOSIS — K769 Liver disease, unspecified: Secondary | ICD-10-CM | POA: Diagnosis not present

## 2015-05-08 DIAGNOSIS — J9600 Acute respiratory failure, unspecified whether with hypoxia or hypercapnia: Secondary | ICD-10-CM | POA: Diagnosis not present

## 2015-05-08 DIAGNOSIS — K50912 Crohn's disease, unspecified, with intestinal obstruction: Secondary | ICD-10-CM | POA: Diagnosis not present

## 2015-05-08 DIAGNOSIS — K50813 Crohn's disease of both small and large intestine with fistula: Secondary | ICD-10-CM | POA: Diagnosis not present

## 2015-05-08 DIAGNOSIS — K269 Duodenal ulcer, unspecified as acute or chronic, without hemorrhage or perforation: Secondary | ICD-10-CM | POA: Diagnosis not present

## 2015-05-08 DIAGNOSIS — I10 Essential (primary) hypertension: Secondary | ICD-10-CM | POA: Diagnosis not present

## 2015-05-09 DIAGNOSIS — K269 Duodenal ulcer, unspecified as acute or chronic, without hemorrhage or perforation: Secondary | ICD-10-CM | POA: Diagnosis not present

## 2015-05-09 DIAGNOSIS — J9600 Acute respiratory failure, unspecified whether with hypoxia or hypercapnia: Secondary | ICD-10-CM | POA: Diagnosis not present

## 2015-05-10 DIAGNOSIS — Z5181 Encounter for therapeutic drug level monitoring: Secondary | ICD-10-CM | POA: Diagnosis not present

## 2015-05-10 DIAGNOSIS — Z452 Encounter for adjustment and management of vascular access device: Secondary | ICD-10-CM | POA: Diagnosis not present

## 2015-05-10 DIAGNOSIS — K50912 Crohn's disease, unspecified, with intestinal obstruction: Secondary | ICD-10-CM | POA: Diagnosis not present

## 2015-05-11 DIAGNOSIS — I10 Essential (primary) hypertension: Secondary | ICD-10-CM | POA: Diagnosis not present

## 2015-05-11 DIAGNOSIS — K50813 Crohn's disease of both small and large intestine with fistula: Secondary | ICD-10-CM | POA: Diagnosis not present

## 2015-05-11 DIAGNOSIS — K769 Liver disease, unspecified: Secondary | ICD-10-CM | POA: Diagnosis not present

## 2015-05-11 DIAGNOSIS — K269 Duodenal ulcer, unspecified as acute or chronic, without hemorrhage or perforation: Secondary | ICD-10-CM | POA: Diagnosis not present

## 2015-05-11 DIAGNOSIS — J9600 Acute respiratory failure, unspecified whether with hypoxia or hypercapnia: Secondary | ICD-10-CM | POA: Diagnosis not present

## 2015-05-14 ENCOUNTER — Other Ambulatory Visit (INDEPENDENT_AMBULATORY_CARE_PROVIDER_SITE_OTHER): Payer: Self-pay | Admitting: Internal Medicine

## 2015-05-14 MED ORDER — OXYCODONE HCL 20 MG/ML PO CONC: 50.0000 mg | Freq: Four times a day (QID) | ORAL | Status: DC

## 2015-05-15 DIAGNOSIS — Z452 Encounter for adjustment and management of vascular access device: Secondary | ICD-10-CM | POA: Diagnosis not present

## 2015-05-15 DIAGNOSIS — Z5181 Encounter for therapeutic drug level monitoring: Secondary | ICD-10-CM | POA: Diagnosis not present

## 2015-05-15 DIAGNOSIS — K5090 Crohn's disease, unspecified, without complications: Secondary | ICD-10-CM | POA: Diagnosis not present

## 2015-05-15 DIAGNOSIS — K50912 Crohn's disease, unspecified, with intestinal obstruction: Secondary | ICD-10-CM | POA: Diagnosis not present

## 2015-05-17 ENCOUNTER — Encounter (INDEPENDENT_AMBULATORY_CARE_PROVIDER_SITE_OTHER): Payer: Self-pay

## 2015-05-17 DIAGNOSIS — Z452 Encounter for adjustment and management of vascular access device: Secondary | ICD-10-CM | POA: Diagnosis not present

## 2015-05-17 DIAGNOSIS — K50912 Crohn's disease, unspecified, with intestinal obstruction: Secondary | ICD-10-CM | POA: Diagnosis not present

## 2015-05-17 DIAGNOSIS — Z5181 Encounter for therapeutic drug level monitoring: Secondary | ICD-10-CM | POA: Diagnosis not present

## 2015-05-17 DIAGNOSIS — Z459 Encounter for adjustment and management of unspecified implanted device: Secondary | ICD-10-CM | POA: Diagnosis not present

## 2015-05-17 DIAGNOSIS — K5090 Crohn's disease, unspecified, without complications: Secondary | ICD-10-CM | POA: Diagnosis not present

## 2015-05-18 DIAGNOSIS — K769 Liver disease, unspecified: Secondary | ICD-10-CM | POA: Diagnosis not present

## 2015-05-18 DIAGNOSIS — K269 Duodenal ulcer, unspecified as acute or chronic, without hemorrhage or perforation: Secondary | ICD-10-CM | POA: Diagnosis not present

## 2015-05-18 DIAGNOSIS — J9600 Acute respiratory failure, unspecified whether with hypoxia or hypercapnia: Secondary | ICD-10-CM | POA: Diagnosis not present

## 2015-05-18 DIAGNOSIS — K50813 Crohn's disease of both small and large intestine with fistula: Secondary | ICD-10-CM | POA: Diagnosis not present

## 2015-05-18 DIAGNOSIS — I10 Essential (primary) hypertension: Secondary | ICD-10-CM | POA: Diagnosis not present

## 2015-05-20 DIAGNOSIS — Z5181 Encounter for therapeutic drug level monitoring: Secondary | ICD-10-CM | POA: Diagnosis not present

## 2015-05-20 DIAGNOSIS — K50912 Crohn's disease, unspecified, with intestinal obstruction: Secondary | ICD-10-CM | POA: Diagnosis not present

## 2015-05-20 DIAGNOSIS — K5090 Crohn's disease, unspecified, without complications: Secondary | ICD-10-CM | POA: Diagnosis not present

## 2015-05-20 DIAGNOSIS — Z452 Encounter for adjustment and management of vascular access device: Secondary | ICD-10-CM | POA: Diagnosis not present

## 2015-05-22 ENCOUNTER — Encounter (INDEPENDENT_AMBULATORY_CARE_PROVIDER_SITE_OTHER): Payer: Self-pay

## 2015-05-22 DIAGNOSIS — I10 Essential (primary) hypertension: Secondary | ICD-10-CM | POA: Diagnosis not present

## 2015-05-22 DIAGNOSIS — K50813 Crohn's disease of both small and large intestine with fistula: Secondary | ICD-10-CM | POA: Diagnosis not present

## 2015-05-22 DIAGNOSIS — K269 Duodenal ulcer, unspecified as acute or chronic, without hemorrhage or perforation: Secondary | ICD-10-CM | POA: Diagnosis not present

## 2015-05-22 DIAGNOSIS — J9600 Acute respiratory failure, unspecified whether with hypoxia or hypercapnia: Secondary | ICD-10-CM | POA: Diagnosis not present

## 2015-05-22 DIAGNOSIS — K769 Liver disease, unspecified: Secondary | ICD-10-CM | POA: Diagnosis not present

## 2015-05-23 ENCOUNTER — Other Ambulatory Visit (INDEPENDENT_AMBULATORY_CARE_PROVIDER_SITE_OTHER): Payer: Self-pay | Admitting: Internal Medicine

## 2015-05-23 ENCOUNTER — Encounter (HOSPITAL_COMMUNITY)
Admission: RE | Admit: 2015-05-23 | Discharge: 2015-05-23 | Disposition: A | Discharge status: Home / Self Care | Payer: Medicare Other | Source: Ambulatory Visit | Attending: Internal Medicine | Admitting: Internal Medicine

## 2015-05-23 DIAGNOSIS — D649 Anemia, unspecified: Secondary | ICD-10-CM | POA: Diagnosis not present

## 2015-05-23 DIAGNOSIS — K7460 Unspecified cirrhosis of liver: Secondary | ICD-10-CM | POA: Diagnosis not present

## 2015-05-23 DIAGNOSIS — K7469 Other cirrhosis of liver: Secondary | ICD-10-CM | POA: Diagnosis not present

## 2015-05-23 DIAGNOSIS — Z79899 Other long term (current) drug therapy: Secondary | ICD-10-CM | POA: Diagnosis not present

## 2015-05-23 DIAGNOSIS — Z4823 Encounter for aftercare following liver transplant: Secondary | ICD-10-CM | POA: Diagnosis not present

## 2015-05-23 DIAGNOSIS — Z01812 Encounter for preprocedural laboratory examination: Secondary | ICD-10-CM | POA: Diagnosis not present

## 2015-05-23 DIAGNOSIS — Z5181 Encounter for therapeutic drug level monitoring: Secondary | ICD-10-CM | POA: Diagnosis not present

## 2015-05-23 LAB — HEMOGLOBIN AND HEMATOCRIT, BLOOD
HCT: 27 % — ABNORMAL LOW (ref 39.0–52.0)
Hemoglobin: 8.5 g/dL — ABNORMAL LOW (ref 13.0–17.0)

## 2015-05-23 LAB — PREPARE RBC (CROSSMATCH)

## 2015-05-23 MED ORDER — OXYCODONE HCL 20 MG/ML PO CONC: 50.0000 mg | Freq: Four times a day (QID) | ORAL | Status: DC

## 2015-05-23 NOTE — Telephone Encounter (Signed)
Patient has apparently been taking every 4 hrs instead of four times a day.  I am going to cover him for 6 days. I will not refill this prescription early again.

## 2015-05-24 ENCOUNTER — Encounter (HOSPITAL_COMMUNITY)
Admission: RE | Admit: 2015-05-24 | Discharge: 2015-05-24 | Disposition: A | Discharge status: Home / Self Care | Payer: Medicare Other | Source: Ambulatory Visit | Attending: Internal Medicine | Admitting: Internal Medicine

## 2015-05-24 ENCOUNTER — Other Ambulatory Visit (HOSPITAL_COMMUNITY)
Admission: AD | Admit: 2015-05-24 | Discharge: 2015-05-24 | Disposition: A | Discharge status: Home / Self Care | Payer: Medicare Other | Source: Other Acute Inpatient Hospital | Attending: Internal Medicine | Admitting: Internal Medicine

## 2015-05-24 DIAGNOSIS — D649 Anemia, unspecified: Secondary | ICD-10-CM | POA: Diagnosis not present

## 2015-05-24 DIAGNOSIS — Z5181 Encounter for therapeutic drug level monitoring: Secondary | ICD-10-CM | POA: Insufficient documentation

## 2015-05-24 DIAGNOSIS — Z452 Encounter for adjustment and management of vascular access device: Secondary | ICD-10-CM | POA: Diagnosis not present

## 2015-05-24 DIAGNOSIS — K50912 Crohn's disease, unspecified, with intestinal obstruction: Secondary | ICD-10-CM | POA: Diagnosis not present

## 2015-05-24 LAB — HEMOGLOBIN AND HEMATOCRIT, BLOOD
HCT: 27.1 % — ABNORMAL LOW (ref 39.0–52.0)
HEMOGLOBIN: 8.7 g/dL — AB (ref 13.0–17.0)

## 2015-05-24 LAB — COMPREHENSIVE METABOLIC PANEL
ALT: 19 U/L (ref 17–63)
ANION GAP: 10 (ref 5–15)
AST: 32 U/L (ref 15–41)
Albumin: 3 g/dL — ABNORMAL LOW (ref 3.5–5.0)
Alkaline Phosphatase: 169 U/L — ABNORMAL HIGH (ref 38–126)
BILIRUBIN TOTAL: 1.5 mg/dL — AB (ref 0.3–1.2)
BUN: 21 mg/dL — ABNORMAL HIGH (ref 6–20)
CHLORIDE: 95 mmol/L — AB (ref 101–111)
CO2: 30 mmol/L (ref 22–32)
CREATININE: 0.91 mg/dL (ref 0.61–1.24)
Calcium: 7.9 mg/dL — ABNORMAL LOW (ref 8.9–10.3)
GFR calc Af Amer: >60 mL/min (ref >60)
GLUCOSE: 89 mg/dL (ref 65–99)
POTASSIUM: 3.2 mmol/L — AB (ref 3.5–5.1)
Sodium: 135 mmol/L (ref 135–145)
Total Protein: 6.3 g/dL — ABNORMAL LOW (ref 6.5–8.1)

## 2015-05-24 MED ORDER — ACETAMINOPHEN 325 MG PO TABS
ORAL_TABLET | ORAL | Status: AC
  Filled 2015-05-24: qty 1

## 2015-05-24 MED ORDER — SODIUM CHLORIDE 0.9 % IV SOLN
Freq: Once | INTRAVENOUS | Status: AC
  Administered 2015-05-24: 500 mL via INTRAVENOUS

## 2015-05-24 MED ORDER — DIPHENHYDRAMINE HCL 25 MG PO CAPS
ORAL_CAPSULE | ORAL | Status: AC
  Filled 2015-05-24: qty 1

## 2015-05-24 MED ORDER — DIPHENHYDRAMINE HCL 25 MG PO TABS
50.0000 mg | ORAL_TABLET | Freq: Once | ORAL | Status: DC
  Filled 2015-05-24: qty 2

## 2015-05-24 MED ORDER — ACETAMINOPHEN 325 MG PO TABS: 650.0000 mg | ORAL_TABLET | Freq: Once | ORAL | Status: DC

## 2015-05-24 NOTE — Progress Notes (Signed)
Results for CHESTER, COUPLAND (MRN [id number]) as of 05/24/2015 14:26  Ref. Range 05/24/2015 11:30  Hemoglobin Latest Ref Range: 13.0-17.0 g/dL 8.7 (L)  HCT Latest Ref Range: 39.0-52.0 % 27.1 (L)  S/p 1 unit pRBC transfusion

## 2015-05-25 DIAGNOSIS — I10 Essential (primary) hypertension: Secondary | ICD-10-CM | POA: Diagnosis not present

## 2015-05-25 DIAGNOSIS — K769 Liver disease, unspecified: Secondary | ICD-10-CM | POA: Diagnosis not present

## 2015-05-25 DIAGNOSIS — K269 Duodenal ulcer, unspecified as acute or chronic, without hemorrhage or perforation: Secondary | ICD-10-CM | POA: Diagnosis not present

## 2015-05-25 DIAGNOSIS — K50813 Crohn's disease of both small and large intestine with fistula: Secondary | ICD-10-CM | POA: Diagnosis not present

## 2015-05-25 DIAGNOSIS — J9600 Acute respiratory failure, unspecified whether with hypoxia or hypercapnia: Secondary | ICD-10-CM | POA: Diagnosis not present

## 2015-05-27 DIAGNOSIS — Z452 Encounter for adjustment and management of vascular access device: Secondary | ICD-10-CM | POA: Diagnosis not present

## 2015-05-27 DIAGNOSIS — Z5181 Encounter for therapeutic drug level monitoring: Secondary | ICD-10-CM | POA: Diagnosis not present

## 2015-05-27 DIAGNOSIS — K50912 Crohn's disease, unspecified, with intestinal obstruction: Secondary | ICD-10-CM | POA: Diagnosis not present

## 2015-05-27 DIAGNOSIS — K5090 Crohn's disease, unspecified, without complications: Secondary | ICD-10-CM | POA: Diagnosis not present

## 2015-05-27 LAB — TYPE AND SCREEN
ABO/RH(D): A POS
Antibody Screen: NEGATIVE
UNIT DIVISION: 0
Unit division: 0

## 2015-05-29 ENCOUNTER — Other Ambulatory Visit (INDEPENDENT_AMBULATORY_CARE_PROVIDER_SITE_OTHER): Payer: Self-pay | Admitting: *Deleted

## 2015-05-29 DIAGNOSIS — A310 Pulmonary mycobacterial infection: Secondary | ICD-10-CM | POA: Diagnosis not present

## 2015-05-29 DIAGNOSIS — Z87891 Personal history of nicotine dependence: Secondary | ICD-10-CM | POA: Diagnosis not present

## 2015-05-29 DIAGNOSIS — K50918 Crohn's disease, unspecified, with other complication: Secondary | ICD-10-CM | POA: Diagnosis not present

## 2015-05-29 DIAGNOSIS — Z7952 Long term (current) use of systemic steroids: Secondary | ICD-10-CM | POA: Diagnosis not present

## 2015-05-29 DIAGNOSIS — R918 Other nonspecific abnormal finding of lung field: Secondary | ICD-10-CM | POA: Diagnosis not present

## 2015-05-29 DIAGNOSIS — Z79899 Other long term (current) drug therapy: Secondary | ICD-10-CM | POA: Diagnosis not present

## 2015-05-29 DIAGNOSIS — Z7682 Awaiting organ transplant status: Secondary | ICD-10-CM | POA: Diagnosis not present

## 2015-05-29 DIAGNOSIS — N186 End stage renal disease: Secondary | ICD-10-CM | POA: Diagnosis not present

## 2015-05-29 DIAGNOSIS — Z5181 Encounter for therapeutic drug level monitoring: Secondary | ICD-10-CM | POA: Diagnosis not present

## 2015-05-29 DIAGNOSIS — Z792 Long term (current) use of antibiotics: Secondary | ICD-10-CM | POA: Diagnosis not present

## 2015-05-29 DIAGNOSIS — J690 Pneumonitis due to inhalation of food and vomit: Secondary | ICD-10-CM | POA: Diagnosis not present

## 2015-05-29 DIAGNOSIS — Z09 Encounter for follow-up examination after completed treatment for conditions other than malignant neoplasm: Secondary | ICD-10-CM | POA: Diagnosis not present

## 2015-05-29 MED ORDER — OXYCODONE HCL 20 MG/ML PO CONC: 50.0000 mg | Freq: Four times a day (QID) | ORAL | Status: DC

## 2015-05-29 NOTE — Telephone Encounter (Signed)
Patient will need a new prescription for the pain medication 05-29-15.

## 2015-05-31 DIAGNOSIS — K50912 Crohn's disease, unspecified, with intestinal obstruction: Secondary | ICD-10-CM | POA: Diagnosis not present

## 2015-05-31 DIAGNOSIS — Z452 Encounter for adjustment and management of vascular access device: Secondary | ICD-10-CM | POA: Diagnosis not present

## 2015-05-31 DIAGNOSIS — Z5181 Encounter for therapeutic drug level monitoring: Secondary | ICD-10-CM | POA: Diagnosis not present

## 2015-06-01 DIAGNOSIS — K269 Duodenal ulcer, unspecified as acute or chronic, without hemorrhage or perforation: Secondary | ICD-10-CM | POA: Diagnosis not present

## 2015-06-01 DIAGNOSIS — K769 Liver disease, unspecified: Secondary | ICD-10-CM | POA: Diagnosis not present

## 2015-06-01 DIAGNOSIS — K50813 Crohn's disease of both small and large intestine with fistula: Secondary | ICD-10-CM | POA: Diagnosis not present

## 2015-06-01 DIAGNOSIS — J9600 Acute respiratory failure, unspecified whether with hypoxia or hypercapnia: Secondary | ICD-10-CM | POA: Diagnosis not present

## 2015-06-01 DIAGNOSIS — I10 Essential (primary) hypertension: Secondary | ICD-10-CM | POA: Diagnosis not present

## 2015-06-03 DIAGNOSIS — Z5189 Encounter for other specified aftercare: Secondary | ICD-10-CM | POA: Diagnosis not present

## 2015-06-03 DIAGNOSIS — K5090 Crohn's disease, unspecified, without complications: Secondary | ICD-10-CM | POA: Diagnosis not present

## 2015-06-03 DIAGNOSIS — K50912 Crohn's disease, unspecified, with intestinal obstruction: Secondary | ICD-10-CM | POA: Diagnosis not present

## 2015-06-03 DIAGNOSIS — Z452 Encounter for adjustment and management of vascular access device: Secondary | ICD-10-CM | POA: Diagnosis not present

## 2015-06-04 DIAGNOSIS — I10 Essential (primary) hypertension: Secondary | ICD-10-CM | POA: Diagnosis not present

## 2015-06-04 DIAGNOSIS — K50813 Crohn's disease of both small and large intestine with fistula: Secondary | ICD-10-CM | POA: Diagnosis not present

## 2015-06-04 DIAGNOSIS — J9600 Acute respiratory failure, unspecified whether with hypoxia or hypercapnia: Secondary | ICD-10-CM | POA: Diagnosis not present

## 2015-06-04 DIAGNOSIS — K769 Liver disease, unspecified: Secondary | ICD-10-CM | POA: Diagnosis not present

## 2015-06-04 DIAGNOSIS — K269 Duodenal ulcer, unspecified as acute or chronic, without hemorrhage or perforation: Secondary | ICD-10-CM | POA: Diagnosis not present

## 2015-06-07 ENCOUNTER — Encounter (INDEPENDENT_AMBULATORY_CARE_PROVIDER_SITE_OTHER): Payer: Self-pay

## 2015-06-07 ENCOUNTER — Other Ambulatory Visit (INDEPENDENT_AMBULATORY_CARE_PROVIDER_SITE_OTHER): Payer: Self-pay | Admitting: *Deleted

## 2015-06-07 DIAGNOSIS — Z5181 Encounter for therapeutic drug level monitoring: Secondary | ICD-10-CM | POA: Diagnosis not present

## 2015-06-07 DIAGNOSIS — K50912 Crohn's disease, unspecified, with intestinal obstruction: Secondary | ICD-10-CM | POA: Diagnosis not present

## 2015-06-07 DIAGNOSIS — Z452 Encounter for adjustment and management of vascular access device: Secondary | ICD-10-CM | POA: Diagnosis not present

## 2015-06-07 DIAGNOSIS — R1031 Right lower quadrant pain: Secondary | ICD-10-CM

## 2015-06-07 MED ORDER — DICYCLOMINE HCL 10 MG PO CAPS: 10.0000 mg | ORAL_CAPSULE | Freq: Three times a day (TID) | ORAL | Status: AC | PRN

## 2015-06-07 NOTE — Telephone Encounter (Signed)
Patient called for a refill on the Dicyclomine 10 mg . Per [name] may refill,request e-scribed to [facility].

## 2015-06-08 DIAGNOSIS — J9600 Acute respiratory failure, unspecified whether with hypoxia or hypercapnia: Secondary | ICD-10-CM | POA: Diagnosis not present

## 2015-06-08 DIAGNOSIS — K269 Duodenal ulcer, unspecified as acute or chronic, without hemorrhage or perforation: Secondary | ICD-10-CM | POA: Diagnosis not present

## 2015-06-08 DIAGNOSIS — K769 Liver disease, unspecified: Secondary | ICD-10-CM | POA: Diagnosis not present

## 2015-06-08 DIAGNOSIS — I10 Essential (primary) hypertension: Secondary | ICD-10-CM | POA: Diagnosis not present

## 2015-06-08 DIAGNOSIS — K50813 Crohn's disease of both small and large intestine with fistula: Secondary | ICD-10-CM | POA: Diagnosis not present

## 2015-06-09 DIAGNOSIS — K269 Duodenal ulcer, unspecified as acute or chronic, without hemorrhage or perforation: Secondary | ICD-10-CM | POA: Diagnosis not present

## 2015-06-09 DIAGNOSIS — J9600 Acute respiratory failure, unspecified whether with hypoxia or hypercapnia: Secondary | ICD-10-CM | POA: Diagnosis not present

## 2015-06-10 ENCOUNTER — Other Ambulatory Visit (INDEPENDENT_AMBULATORY_CARE_PROVIDER_SITE_OTHER): Payer: Self-pay | Admitting: *Deleted

## 2015-06-10 DIAGNOSIS — K50912 Crohn's disease, unspecified, with intestinal obstruction: Secondary | ICD-10-CM | POA: Diagnosis not present

## 2015-06-10 DIAGNOSIS — K7460 Unspecified cirrhosis of liver: Secondary | ICD-10-CM | POA: Diagnosis not present

## 2015-06-10 DIAGNOSIS — E43 Unspecified severe protein-calorie malnutrition: Secondary | ICD-10-CM | POA: Diagnosis not present

## 2015-06-10 DIAGNOSIS — K739 Chronic hepatitis, unspecified: Secondary | ICD-10-CM | POA: Diagnosis not present

## 2015-06-10 DIAGNOSIS — J9601 Acute respiratory failure with hypoxia: Secondary | ICD-10-CM | POA: Diagnosis not present

## 2015-06-10 DIAGNOSIS — K3184 Gastroparesis: Secondary | ICD-10-CM | POA: Diagnosis not present

## 2015-06-10 DIAGNOSIS — Z4801 Encounter for change or removal of surgical wound dressing: Secondary | ICD-10-CM | POA: Diagnosis not present

## 2015-06-10 DIAGNOSIS — Z452 Encounter for adjustment and management of vascular access device: Secondary | ICD-10-CM | POA: Diagnosis not present

## 2015-06-10 DIAGNOSIS — K5090 Crohn's disease, unspecified, without complications: Secondary | ICD-10-CM | POA: Diagnosis not present

## 2015-06-10 DIAGNOSIS — Z5181 Encounter for therapeutic drug level monitoring: Secondary | ICD-10-CM | POA: Diagnosis not present

## 2015-06-10 DIAGNOSIS — K219 Gastro-esophageal reflux disease without esophagitis: Secondary | ICD-10-CM | POA: Diagnosis not present

## 2015-06-10 DIAGNOSIS — L89151 Pressure ulcer of sacral region, stage 1: Secondary | ICD-10-CM | POA: Diagnosis not present

## 2015-06-10 DIAGNOSIS — K760 Fatty (change of) liver, not elsewhere classified: Secondary | ICD-10-CM | POA: Diagnosis not present

## 2015-06-10 DIAGNOSIS — J189 Pneumonia, unspecified organism: Secondary | ICD-10-CM | POA: Diagnosis not present

## 2015-06-10 DIAGNOSIS — K7689 Other specified diseases of liver: Secondary | ICD-10-CM | POA: Diagnosis not present

## 2015-06-10 DIAGNOSIS — I10 Essential (primary) hypertension: Secondary | ICD-10-CM | POA: Diagnosis not present

## 2015-06-10 NOTE — Telephone Encounter (Signed)
Patient called and request his written prescription for  Oxycodone Solution. He knows that it is not due till 06-12-15. He is planning to go to [facility] with his family.

## 2015-06-11 MED ORDER — OXYCODONE HCL 20 MG/ML PO CONC: 50.0000 mg | Freq: Four times a day (QID) | ORAL | Status: DC

## 2015-06-14 DIAGNOSIS — K50912 Crohn's disease, unspecified, with intestinal obstruction: Secondary | ICD-10-CM | POA: Diagnosis not present

## 2015-06-14 DIAGNOSIS — J189 Pneumonia, unspecified organism: Secondary | ICD-10-CM | POA: Diagnosis not present

## 2015-06-14 DIAGNOSIS — Z452 Encounter for adjustment and management of vascular access device: Secondary | ICD-10-CM | POA: Diagnosis not present

## 2015-06-15 DIAGNOSIS — K769 Liver disease, unspecified: Secondary | ICD-10-CM | POA: Diagnosis not present

## 2015-06-15 DIAGNOSIS — K269 Duodenal ulcer, unspecified as acute or chronic, without hemorrhage or perforation: Secondary | ICD-10-CM | POA: Diagnosis not present

## 2015-06-15 DIAGNOSIS — J9600 Acute respiratory failure, unspecified whether with hypoxia or hypercapnia: Secondary | ICD-10-CM | POA: Diagnosis not present

## 2015-06-15 DIAGNOSIS — I10 Essential (primary) hypertension: Secondary | ICD-10-CM | POA: Diagnosis not present

## 2015-06-15 DIAGNOSIS — K50813 Crohn's disease of both small and large intestine with fistula: Secondary | ICD-10-CM | POA: Diagnosis not present

## 2015-06-19 DIAGNOSIS — Z5181 Encounter for therapeutic drug level monitoring: Secondary | ICD-10-CM | POA: Diagnosis not present

## 2015-06-19 DIAGNOSIS — K5090 Crohn's disease, unspecified, without complications: Secondary | ICD-10-CM | POA: Diagnosis not present

## 2015-06-19 DIAGNOSIS — K50912 Crohn's disease, unspecified, with intestinal obstruction: Secondary | ICD-10-CM | POA: Diagnosis not present

## 2015-06-19 DIAGNOSIS — Z452 Encounter for adjustment and management of vascular access device: Secondary | ICD-10-CM | POA: Diagnosis not present

## 2015-06-21 ENCOUNTER — Other Ambulatory Visit (INDEPENDENT_AMBULATORY_CARE_PROVIDER_SITE_OTHER): Payer: Self-pay | Admitting: *Deleted

## 2015-06-21 NOTE — Telephone Encounter (Signed)
This will need to be done 06/26/15. It was last written on 06/11/15 , 1 day prior to the day it was to filled as the patient was going out of town. It ws not filled until 06-12-15.

## 2015-06-22 DIAGNOSIS — K269 Duodenal ulcer, unspecified as acute or chronic, without hemorrhage or perforation: Secondary | ICD-10-CM | POA: Diagnosis not present

## 2015-06-22 DIAGNOSIS — J9600 Acute respiratory failure, unspecified whether with hypoxia or hypercapnia: Secondary | ICD-10-CM | POA: Diagnosis not present

## 2015-06-22 DIAGNOSIS — K50813 Crohn's disease of both small and large intestine with fistula: Secondary | ICD-10-CM | POA: Diagnosis not present

## 2015-06-22 DIAGNOSIS — I10 Essential (primary) hypertension: Secondary | ICD-10-CM | POA: Diagnosis not present

## 2015-06-22 DIAGNOSIS — K769 Liver disease, unspecified: Secondary | ICD-10-CM | POA: Diagnosis not present

## 2015-06-24 DIAGNOSIS — Z5181 Encounter for therapeutic drug level monitoring: Secondary | ICD-10-CM | POA: Diagnosis not present

## 2015-06-24 DIAGNOSIS — K5090 Crohn's disease, unspecified, without complications: Secondary | ICD-10-CM | POA: Diagnosis not present

## 2015-06-24 DIAGNOSIS — K50912 Crohn's disease, unspecified, with intestinal obstruction: Secondary | ICD-10-CM | POA: Diagnosis not present

## 2015-06-24 DIAGNOSIS — Z452 Encounter for adjustment and management of vascular access device: Secondary | ICD-10-CM | POA: Diagnosis not present

## 2015-06-25 ENCOUNTER — Encounter (INDEPENDENT_AMBULATORY_CARE_PROVIDER_SITE_OTHER): Payer: Self-pay

## 2015-06-25 MED ORDER — OXYCODONE HCL 20 MG/ML PO CONC: 50.0000 mg | Freq: Four times a day (QID) | ORAL | Status: DC

## 2015-06-26 ENCOUNTER — Encounter (INDEPENDENT_AMBULATORY_CARE_PROVIDER_SITE_OTHER): Payer: Self-pay

## 2015-06-26 DIAGNOSIS — K50918 Crohn's disease, unspecified, with other complication: Secondary | ICD-10-CM | POA: Diagnosis not present

## 2015-06-26 DIAGNOSIS — Z931 Gastrostomy status: Secondary | ICD-10-CM | POA: Diagnosis not present

## 2015-06-26 DIAGNOSIS — Z79899 Other long term (current) drug therapy: Secondary | ICD-10-CM | POA: Diagnosis not present

## 2015-06-26 DIAGNOSIS — R627 Adult failure to thrive: Secondary | ICD-10-CM | POA: Diagnosis not present

## 2015-06-26 DIAGNOSIS — E559 Vitamin D deficiency, unspecified: Secondary | ICD-10-CM | POA: Diagnosis not present

## 2015-06-26 DIAGNOSIS — K5660 Unspecified intestinal obstruction: Secondary | ICD-10-CM | POA: Diagnosis not present

## 2015-06-26 DIAGNOSIS — E876 Hypokalemia: Secondary | ICD-10-CM | POA: Diagnosis not present

## 2015-06-26 DIAGNOSIS — Z789 Other specified health status: Secondary | ICD-10-CM | POA: Diagnosis not present

## 2015-06-26 DIAGNOSIS — J690 Pneumonitis due to inhalation of food and vomit: Secondary | ICD-10-CM | POA: Diagnosis not present

## 2015-06-26 DIAGNOSIS — K7460 Unspecified cirrhosis of liver: Secondary | ICD-10-CM | POA: Diagnosis not present

## 2015-06-26 DIAGNOSIS — E611 Iron deficiency: Secondary | ICD-10-CM | POA: Diagnosis not present

## 2015-06-26 DIAGNOSIS — K50019 Crohn's disease of small intestine with unspecified complications: Secondary | ICD-10-CM | POA: Diagnosis not present

## 2015-06-26 DIAGNOSIS — A310 Pulmonary mycobacterial infection: Secondary | ICD-10-CM | POA: Diagnosis not present

## 2015-06-27 ENCOUNTER — Other Ambulatory Visit (HOSPITAL_COMMUNITY)
Admission: RE | Admit: 2015-06-27 | Discharge: 2015-06-27 | Disposition: A | Discharge status: Home / Self Care | Payer: Medicare Other | Source: Other Acute Inpatient Hospital | Attending: Internal Medicine | Admitting: Internal Medicine

## 2015-06-27 DIAGNOSIS — Z5181 Encounter for therapeutic drug level monitoring: Secondary | ICD-10-CM | POA: Diagnosis not present

## 2015-06-27 DIAGNOSIS — Z452 Encounter for adjustment and management of vascular access device: Secondary | ICD-10-CM | POA: Diagnosis not present

## 2015-06-27 DIAGNOSIS — K50912 Crohn's disease, unspecified, with intestinal obstruction: Secondary | ICD-10-CM | POA: Diagnosis not present

## 2015-06-27 LAB — BASIC METABOLIC PANEL
Anion gap: 8 (ref 5–15)
BUN: 49 mg/dL — ABNORMAL HIGH (ref 6–20)
CO2: 34 mmol/L — AB (ref 22–32)
CREATININE: 1.28 mg/dL — AB (ref 0.61–1.24)
Calcium: 8.1 mg/dL — ABNORMAL LOW (ref 8.9–10.3)
Chloride: 87 mmol/L — ABNORMAL LOW (ref 101–111)
GFR calc Af Amer: >60 mL/min (ref >60)
GFR calc non Af Amer: >60 mL/min (ref >60)
Glucose, Bld: 104 mg/dL — ABNORMAL HIGH (ref 65–99)
Potassium: 3.9 mmol/L (ref 3.5–5.1)
Sodium: 129 mmol/L — ABNORMAL LOW (ref 135–145)

## 2015-06-29 DIAGNOSIS — J9600 Acute respiratory failure, unspecified whether with hypoxia or hypercapnia: Secondary | ICD-10-CM | POA: Diagnosis not present

## 2015-06-29 DIAGNOSIS — K50813 Crohn's disease of both small and large intestine with fistula: Secondary | ICD-10-CM | POA: Diagnosis not present

## 2015-06-29 DIAGNOSIS — K269 Duodenal ulcer, unspecified as acute or chronic, without hemorrhage or perforation: Secondary | ICD-10-CM | POA: Diagnosis not present

## 2015-06-29 DIAGNOSIS — K769 Liver disease, unspecified: Secondary | ICD-10-CM | POA: Diagnosis not present

## 2015-06-29 DIAGNOSIS — I10 Essential (primary) hypertension: Secondary | ICD-10-CM | POA: Diagnosis not present

## 2015-07-01 DIAGNOSIS — K50912 Crohn's disease, unspecified, with intestinal obstruction: Secondary | ICD-10-CM | POA: Diagnosis not present

## 2015-07-01 DIAGNOSIS — Z5181 Encounter for therapeutic drug level monitoring: Secondary | ICD-10-CM | POA: Diagnosis not present

## 2015-07-01 DIAGNOSIS — Z452 Encounter for adjustment and management of vascular access device: Secondary | ICD-10-CM | POA: Diagnosis not present

## 2015-07-06 DIAGNOSIS — K50813 Crohn's disease of both small and large intestine with fistula: Secondary | ICD-10-CM | POA: Diagnosis not present

## 2015-07-06 DIAGNOSIS — J9600 Acute respiratory failure, unspecified whether with hypoxia or hypercapnia: Secondary | ICD-10-CM | POA: Diagnosis not present

## 2015-07-06 DIAGNOSIS — I10 Essential (primary) hypertension: Secondary | ICD-10-CM | POA: Diagnosis not present

## 2015-07-06 DIAGNOSIS — K769 Liver disease, unspecified: Secondary | ICD-10-CM | POA: Diagnosis not present

## 2015-07-06 DIAGNOSIS — K269 Duodenal ulcer, unspecified as acute or chronic, without hemorrhage or perforation: Secondary | ICD-10-CM | POA: Diagnosis not present

## 2015-07-08 ENCOUNTER — Other Ambulatory Visit (INDEPENDENT_AMBULATORY_CARE_PROVIDER_SITE_OTHER): Payer: Self-pay | Admitting: *Deleted

## 2015-07-08 DIAGNOSIS — Z5181 Encounter for therapeutic drug level monitoring: Secondary | ICD-10-CM | POA: Diagnosis not present

## 2015-07-08 DIAGNOSIS — K50912 Crohn's disease, unspecified, with intestinal obstruction: Secondary | ICD-10-CM | POA: Diagnosis not present

## 2015-07-08 DIAGNOSIS — Z452 Encounter for adjustment and management of vascular access device: Secondary | ICD-10-CM | POA: Diagnosis not present

## 2015-07-08 DIAGNOSIS — K5090 Crohn's disease, unspecified, without complications: Secondary | ICD-10-CM | POA: Diagnosis not present

## 2015-07-08 NOTE — Telephone Encounter (Signed)
This was last written on 06/25/15. Patient states that he has an appointment for pain management on Wednesday and it would be easier for him to pick it up on Tuesday. Patient will be ask not to get it filled until the 07/10/15.

## 2015-07-09 DIAGNOSIS — K269 Duodenal ulcer, unspecified as acute or chronic, without hemorrhage or perforation: Secondary | ICD-10-CM | POA: Diagnosis not present

## 2015-07-09 DIAGNOSIS — J9600 Acute respiratory failure, unspecified whether with hypoxia or hypercapnia: Secondary | ICD-10-CM | POA: Diagnosis not present

## 2015-07-09 MED ORDER — OXYCODONE HCL 20 MG/ML PO CONC: 50.0000 mg | Freq: Four times a day (QID) | ORAL | Status: DC

## 2015-07-10 DIAGNOSIS — R1031 Right lower quadrant pain: Secondary | ICD-10-CM | POA: Diagnosis not present

## 2015-07-10 DIAGNOSIS — F1190 Opioid use, unspecified, uncomplicated: Secondary | ICD-10-CM | POA: Diagnosis not present

## 2015-07-13 DIAGNOSIS — I10 Essential (primary) hypertension: Secondary | ICD-10-CM | POA: Diagnosis not present

## 2015-07-13 DIAGNOSIS — K50813 Crohn's disease of both small and large intestine with fistula: Secondary | ICD-10-CM | POA: Diagnosis not present

## 2015-07-13 DIAGNOSIS — K769 Liver disease, unspecified: Secondary | ICD-10-CM | POA: Diagnosis not present

## 2015-07-13 DIAGNOSIS — J9600 Acute respiratory failure, unspecified whether with hypoxia or hypercapnia: Secondary | ICD-10-CM | POA: Diagnosis not present

## 2015-07-13 DIAGNOSIS — K269 Duodenal ulcer, unspecified as acute or chronic, without hemorrhage or perforation: Secondary | ICD-10-CM | POA: Diagnosis not present

## 2015-07-15 ENCOUNTER — Telehealth (INDEPENDENT_AMBULATORY_CARE_PROVIDER_SITE_OTHER): Payer: Self-pay | Admitting: *Deleted

## 2015-07-15 DIAGNOSIS — K50912 Crohn's disease, unspecified, with intestinal obstruction: Secondary | ICD-10-CM | POA: Diagnosis not present

## 2015-07-15 DIAGNOSIS — Z452 Encounter for adjustment and management of vascular access device: Secondary | ICD-10-CM | POA: Diagnosis not present

## 2015-07-15 DIAGNOSIS — Z5181 Encounter for therapeutic drug level monitoring: Secondary | ICD-10-CM | POA: Diagnosis not present

## 2015-07-15 DIAGNOSIS — K5090 Crohn's disease, unspecified, without complications: Secondary | ICD-10-CM | POA: Diagnosis not present

## 2015-07-15 NOTE — Telephone Encounter (Signed)
[name] from [facility] called to make [place] aware that David [name] took a fall at home on Friday night,07/12/15. She states that he hit his head, small laceration on nose, and bruising around eye. Mr. Eiland did not go to the ED and states that he is okay. Any further questions call [name] or Mr.Damas.  Will share this with Dr.Rehman.  Per Dr.Rehman we have no further questions.

## 2015-07-16 DIAGNOSIS — Z5181 Encounter for therapeutic drug level monitoring: Secondary | ICD-10-CM | POA: Diagnosis not present

## 2015-07-16 DIAGNOSIS — R52 Pain, unspecified: Secondary | ICD-10-CM | POA: Diagnosis not present

## 2015-07-16 DIAGNOSIS — J90 Pleural effusion, not elsewhere classified: Secondary | ICD-10-CM | POA: Diagnosis not present

## 2015-07-16 DIAGNOSIS — K269 Duodenal ulcer, unspecified as acute or chronic, without hemorrhage or perforation: Secondary | ICD-10-CM | POA: Diagnosis not present

## 2015-07-16 DIAGNOSIS — K912 Postsurgical malabsorption, not elsewhere classified: Secondary | ICD-10-CM | POA: Diagnosis not present

## 2015-07-16 DIAGNOSIS — N179 Acute kidney failure, unspecified: Secondary | ICD-10-CM | POA: Diagnosis not present

## 2015-07-16 DIAGNOSIS — J9600 Acute respiratory failure, unspecified whether with hypoxia or hypercapnia: Secondary | ICD-10-CM | POA: Diagnosis not present

## 2015-07-16 DIAGNOSIS — D62 Acute posthemorrhagic anemia: Secondary | ICD-10-CM | POA: Diagnosis not present

## 2015-07-16 DIAGNOSIS — E46 Unspecified protein-calorie malnutrition: Secondary | ICD-10-CM | POA: Diagnosis not present

## 2015-07-16 DIAGNOSIS — K50019 Crohn's disease of small intestine with unspecified complications: Secondary | ICD-10-CM | POA: Diagnosis not present

## 2015-07-16 DIAGNOSIS — R112 Nausea with vomiting, unspecified: Secondary | ICD-10-CM | POA: Diagnosis not present

## 2015-07-16 DIAGNOSIS — K769 Liver disease, unspecified: Secondary | ICD-10-CM | POA: Diagnosis not present

## 2015-07-16 DIAGNOSIS — E860 Dehydration: Secondary | ICD-10-CM | POA: Diagnosis not present

## 2015-07-16 DIAGNOSIS — A419 Sepsis, unspecified organism: Secondary | ICD-10-CM | POA: Diagnosis not present

## 2015-07-16 DIAGNOSIS — R17 Unspecified jaundice: Secondary | ICD-10-CM | POA: Diagnosis not present

## 2015-07-16 DIAGNOSIS — K9423 Gastrostomy malfunction: Secondary | ICD-10-CM | POA: Diagnosis not present

## 2015-07-16 DIAGNOSIS — K7469 Other cirrhosis of liver: Secondary | ICD-10-CM | POA: Diagnosis not present

## 2015-07-16 DIAGNOSIS — Z789 Other specified health status: Secondary | ICD-10-CM | POA: Diagnosis not present

## 2015-07-16 DIAGNOSIS — R404 Transient alteration of awareness: Secondary | ICD-10-CM | POA: Diagnosis not present

## 2015-07-16 DIAGNOSIS — Z7682 Awaiting organ transplant status: Secondary | ICD-10-CM | POA: Diagnosis not present

## 2015-07-16 DIAGNOSIS — R64 Cachexia: Secondary | ICD-10-CM | POA: Diagnosis not present

## 2015-07-16 DIAGNOSIS — Z79899 Other long term (current) drug therapy: Secondary | ICD-10-CM | POA: Diagnosis not present

## 2015-07-16 DIAGNOSIS — B49 Unspecified mycosis: Secondary | ICD-10-CM | POA: Diagnosis not present

## 2015-07-16 DIAGNOSIS — R1114 Bilious vomiting: Secondary | ICD-10-CM | POA: Diagnosis not present

## 2015-07-16 DIAGNOSIS — J690 Pneumonitis due to inhalation of food and vomit: Secondary | ICD-10-CM | POA: Diagnosis not present

## 2015-07-16 DIAGNOSIS — R571 Hypovolemic shock: Secondary | ICD-10-CM | POA: Diagnosis not present

## 2015-07-16 DIAGNOSIS — R531 Weakness: Secondary | ICD-10-CM | POA: Diagnosis not present

## 2015-07-16 DIAGNOSIS — K50918 Crohn's disease, unspecified, with other complication: Secondary | ICD-10-CM | POA: Diagnosis not present

## 2015-07-16 DIAGNOSIS — I10 Essential (primary) hypertension: Secondary | ICD-10-CM | POA: Diagnosis not present

## 2015-07-16 DIAGNOSIS — E871 Hypo-osmolality and hyponatremia: Secondary | ICD-10-CM | POA: Diagnosis not present

## 2015-07-16 DIAGNOSIS — R509 Fever, unspecified: Secondary | ICD-10-CM | POA: Diagnosis not present

## 2015-07-16 DIAGNOSIS — T85528A Displacement of other gastrointestinal prosthetic devices, implants and grafts, initial encounter: Secondary | ICD-10-CM | POA: Diagnosis not present

## 2015-07-16 DIAGNOSIS — B377 Candidal sepsis: Secondary | ICD-10-CM | POA: Diagnosis not present

## 2015-07-16 DIAGNOSIS — F329 Major depressive disorder, single episode, unspecified: Secondary | ICD-10-CM | POA: Diagnosis not present

## 2015-07-16 DIAGNOSIS — K5090 Crohn's disease, unspecified, without complications: Secondary | ICD-10-CM | POA: Diagnosis not present

## 2015-07-16 DIAGNOSIS — R6521 Severe sepsis with septic shock: Secondary | ICD-10-CM | POA: Diagnosis not present

## 2015-07-16 DIAGNOSIS — K766 Portal hypertension: Secondary | ICD-10-CM | POA: Diagnosis not present

## 2015-07-16 DIAGNOSIS — Z431 Encounter for attention to gastrostomy: Secondary | ICD-10-CM | POA: Diagnosis not present

## 2015-07-16 DIAGNOSIS — K922 Gastrointestinal hemorrhage, unspecified: Secondary | ICD-10-CM | POA: Diagnosis not present

## 2015-07-16 DIAGNOSIS — N1330 Unspecified hydronephrosis: Secondary | ICD-10-CM | POA: Diagnosis not present

## 2015-07-16 DIAGNOSIS — R918 Other nonspecific abnormal finding of lung field: Secondary | ICD-10-CM | POA: Diagnosis not present

## 2015-07-16 DIAGNOSIS — Z452 Encounter for adjustment and management of vascular access device: Secondary | ICD-10-CM | POA: Diagnosis not present

## 2015-07-16 DIAGNOSIS — G8929 Other chronic pain: Secondary | ICD-10-CM | POA: Diagnosis not present

## 2015-07-16 DIAGNOSIS — D696 Thrombocytopenia, unspecified: Secondary | ICD-10-CM | POA: Diagnosis not present

## 2015-07-16 DIAGNOSIS — A499 Bacterial infection, unspecified: Secondary | ICD-10-CM | POA: Diagnosis not present

## 2015-07-16 DIAGNOSIS — R188 Other ascites: Secondary | ICD-10-CM | POA: Diagnosis not present

## 2015-07-16 DIAGNOSIS — E870 Hyperosmolality and hypernatremia: Secondary | ICD-10-CM | POA: Diagnosis not present

## 2015-07-16 DIAGNOSIS — K50813 Crohn's disease of both small and large intestine with fistula: Secondary | ICD-10-CM | POA: Diagnosis not present

## 2015-07-16 DIAGNOSIS — E43 Unspecified severe protein-calorie malnutrition: Secondary | ICD-10-CM | POA: Diagnosis not present

## 2015-07-16 DIAGNOSIS — R109 Unspecified abdominal pain: Secondary | ICD-10-CM | POA: Diagnosis not present

## 2015-07-16 DIAGNOSIS — K7460 Unspecified cirrhosis of liver: Secondary | ICD-10-CM | POA: Diagnosis not present

## 2015-07-16 DIAGNOSIS — E873 Alkalosis: Secondary | ICD-10-CM | POA: Diagnosis not present

## 2015-07-16 DIAGNOSIS — R911 Solitary pulmonary nodule: Secondary | ICD-10-CM | POA: Diagnosis not present

## 2015-07-16 DIAGNOSIS — G253 Myoclonus: Secondary | ICD-10-CM | POA: Diagnosis not present

## 2015-07-16 DIAGNOSIS — K219 Gastro-esophageal reflux disease without esophagitis: Secondary | ICD-10-CM | POA: Diagnosis not present

## 2015-07-16 DIAGNOSIS — D61818 Other pancytopenia: Secondary | ICD-10-CM | POA: Diagnosis not present

## 2015-07-16 DIAGNOSIS — Z931 Gastrostomy status: Secondary | ICD-10-CM | POA: Diagnosis not present

## 2015-07-16 DIAGNOSIS — Z4682 Encounter for fitting and adjustment of non-vascular catheter: Secondary | ICD-10-CM | POA: Diagnosis not present

## 2015-07-16 DIAGNOSIS — Z980 Intestinal bypass and anastomosis status: Secondary | ICD-10-CM | POA: Diagnosis not present

## 2015-07-16 DIAGNOSIS — F1190 Opioid use, unspecified, uncomplicated: Secondary | ICD-10-CM | POA: Diagnosis not present

## 2015-07-16 DIAGNOSIS — Z23 Encounter for immunization: Secondary | ICD-10-CM | POA: Diagnosis not present

## 2015-07-16 DIAGNOSIS — J9601 Acute respiratory failure with hypoxia: Secondary | ICD-10-CM | POA: Diagnosis not present

## 2015-07-16 DIAGNOSIS — I8501 Esophageal varices with bleeding: Secondary | ICD-10-CM | POA: Diagnosis not present

## 2015-07-16 DIAGNOSIS — A310 Pulmonary mycobacterial infection: Secondary | ICD-10-CM | POA: Diagnosis not present

## 2015-07-16 DIAGNOSIS — N132 Hydronephrosis with renal and ureteral calculous obstruction: Secondary | ICD-10-CM | POA: Diagnosis not present

## 2015-07-16 DIAGNOSIS — R4182 Altered mental status, unspecified: Secondary | ICD-10-CM | POA: Diagnosis not present

## 2015-07-16 DIAGNOSIS — K6389 Other specified diseases of intestine: Secondary | ICD-10-CM | POA: Diagnosis not present

## 2015-07-16 DIAGNOSIS — R7881 Bacteremia: Secondary | ICD-10-CM | POA: Diagnosis not present

## 2015-07-16 DIAGNOSIS — K5660 Unspecified intestinal obstruction: Secondary | ICD-10-CM | POA: Diagnosis not present

## 2015-07-16 DIAGNOSIS — R161 Splenomegaly, not elsewhere classified: Secondary | ICD-10-CM | POA: Diagnosis not present

## 2015-07-17 ENCOUNTER — Encounter (INDEPENDENT_AMBULATORY_CARE_PROVIDER_SITE_OTHER): Payer: Self-pay

## 2015-07-18 ENCOUNTER — Encounter (INDEPENDENT_AMBULATORY_CARE_PROVIDER_SITE_OTHER): Payer: Self-pay

## 2015-08-05 ENCOUNTER — Other Ambulatory Visit (INDEPENDENT_AMBULATORY_CARE_PROVIDER_SITE_OTHER): Payer: Self-pay | Admitting: *Deleted

## 2015-08-05 NOTE — Telephone Encounter (Signed)
Patient's wife called this morning and states that the patient is being discharged home today. He was advised there , that they would prefer that Dr.Rehman continue filling the pain medication,otherwise the patient would need to come down there every 2 weeks to get the prescription.  Forwarded to [name] to refill , as Dr.Rehman is out of office.

## 2015-08-06 MED ORDER — OXYCODONE HCL 20 MG/ML PO CONC: 50.0000 mg | Freq: Four times a day (QID) | ORAL | Status: DC

## 2015-08-07 DIAGNOSIS — I10 Essential (primary) hypertension: Secondary | ICD-10-CM | POA: Diagnosis not present

## 2015-08-07 DIAGNOSIS — K50813 Crohn's disease of both small and large intestine with fistula: Secondary | ICD-10-CM | POA: Diagnosis not present

## 2015-08-07 DIAGNOSIS — K769 Liver disease, unspecified: Secondary | ICD-10-CM | POA: Diagnosis not present

## 2015-08-07 DIAGNOSIS — J9600 Acute respiratory failure, unspecified whether with hypoxia or hypercapnia: Secondary | ICD-10-CM | POA: Diagnosis not present

## 2015-08-07 DIAGNOSIS — K269 Duodenal ulcer, unspecified as acute or chronic, without hemorrhage or perforation: Secondary | ICD-10-CM | POA: Diagnosis not present

## 2015-08-08 DIAGNOSIS — Z452 Encounter for adjustment and management of vascular access device: Secondary | ICD-10-CM | POA: Diagnosis not present

## 2015-08-08 DIAGNOSIS — K50912 Crohn's disease, unspecified, with intestinal obstruction: Secondary | ICD-10-CM | POA: Diagnosis not present

## 2015-08-09 DIAGNOSIS — K269 Duodenal ulcer, unspecified as acute or chronic, without hemorrhage or perforation: Secondary | ICD-10-CM | POA: Diagnosis not present

## 2015-08-09 DIAGNOSIS — J9600 Acute respiratory failure, unspecified whether with hypoxia or hypercapnia: Secondary | ICD-10-CM | POA: Diagnosis not present

## 2015-08-10 DIAGNOSIS — J9600 Acute respiratory failure, unspecified whether with hypoxia or hypercapnia: Secondary | ICD-10-CM | POA: Diagnosis not present

## 2015-08-10 DIAGNOSIS — I10 Essential (primary) hypertension: Secondary | ICD-10-CM | POA: Diagnosis not present

## 2015-08-10 DIAGNOSIS — K269 Duodenal ulcer, unspecified as acute or chronic, without hemorrhage or perforation: Secondary | ICD-10-CM | POA: Diagnosis not present

## 2015-08-10 DIAGNOSIS — K50813 Crohn's disease of both small and large intestine with fistula: Secondary | ICD-10-CM | POA: Diagnosis not present

## 2015-08-10 DIAGNOSIS — K769 Liver disease, unspecified: Secondary | ICD-10-CM | POA: Diagnosis not present

## 2015-08-11 DIAGNOSIS — K269 Duodenal ulcer, unspecified as acute or chronic, without hemorrhage or perforation: Secondary | ICD-10-CM | POA: Diagnosis not present

## 2015-08-11 DIAGNOSIS — I10 Essential (primary) hypertension: Secondary | ICD-10-CM | POA: Diagnosis not present

## 2015-08-11 DIAGNOSIS — K769 Liver disease, unspecified: Secondary | ICD-10-CM | POA: Diagnosis not present

## 2015-08-11 DIAGNOSIS — J9600 Acute respiratory failure, unspecified whether with hypoxia or hypercapnia: Secondary | ICD-10-CM | POA: Diagnosis not present

## 2015-08-11 DIAGNOSIS — K50813 Crohn's disease of both small and large intestine with fistula: Secondary | ICD-10-CM | POA: Diagnosis not present

## 2015-08-12 ENCOUNTER — Encounter (INDEPENDENT_AMBULATORY_CARE_PROVIDER_SITE_OTHER): Payer: Self-pay | Admitting: Internal Medicine

## 2015-08-12 ENCOUNTER — Ambulatory Visit (INDEPENDENT_AMBULATORY_CARE_PROVIDER_SITE_OTHER): Payer: Medicare Other | Admitting: Internal Medicine

## 2015-08-12 VITALS — BP 98/66 | HR 62 | Temp 98.4°F | Resp 16 | Ht 71.0 in | Wt 167.8 lb

## 2015-08-12 DIAGNOSIS — K50912 Crohn's disease, unspecified, with intestinal obstruction: Secondary | ICD-10-CM | POA: Diagnosis not present

## 2015-08-12 DIAGNOSIS — G8929 Other chronic pain: Secondary | ICD-10-CM

## 2015-08-12 DIAGNOSIS — K50813 Crohn's disease of both small and large intestine with fistula: Secondary | ICD-10-CM | POA: Diagnosis not present

## 2015-08-12 DIAGNOSIS — R600 Localized edema: Secondary | ICD-10-CM | POA: Diagnosis not present

## 2015-08-12 DIAGNOSIS — K769 Liver disease, unspecified: Secondary | ICD-10-CM | POA: Diagnosis not present

## 2015-08-12 DIAGNOSIS — K7460 Unspecified cirrhosis of liver: Secondary | ICD-10-CM | POA: Diagnosis not present

## 2015-08-12 DIAGNOSIS — K269 Duodenal ulcer, unspecified as acute or chronic, without hemorrhage or perforation: Secondary | ICD-10-CM | POA: Diagnosis not present

## 2015-08-12 DIAGNOSIS — I10 Essential (primary) hypertension: Secondary | ICD-10-CM | POA: Diagnosis not present

## 2015-08-12 DIAGNOSIS — Z5181 Encounter for therapeutic drug level monitoring: Secondary | ICD-10-CM | POA: Diagnosis not present

## 2015-08-12 DIAGNOSIS — K5090 Crohn's disease, unspecified, without complications: Secondary | ICD-10-CM | POA: Diagnosis not present

## 2015-08-12 DIAGNOSIS — R1084 Generalized abdominal pain: Secondary | ICD-10-CM

## 2015-08-12 DIAGNOSIS — J9600 Acute respiratory failure, unspecified whether with hypoxia or hypercapnia: Secondary | ICD-10-CM | POA: Diagnosis not present

## 2015-08-12 DIAGNOSIS — Z452 Encounter for adjustment and management of vascular access device: Secondary | ICD-10-CM | POA: Diagnosis not present

## 2015-08-12 MED ORDER — FUROSEMIDE 40 MG PO TABS: 40.0000 mg | ORAL_TABLET | Freq: Every day | ORAL | Status: DC | PRN

## 2015-08-12 NOTE — Progress Notes (Addendum)
Presenting complaint;  Follow-up for multiple problems.  Subjective:  David [name] is 39 year old Caucasian male who is here for scheduled visit. He was last seen on 11/27/2014. He's been going to [facility] on frequent basis. 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 He was hospitalized at [facility] on 07/17/2015 for febrile illness. He was documented to have fun anemia. Hickman catheter was removed and then placed again few days later and he is back on intermittent parenteral nutrition. During that admission PEG was converted to PEJ. He states he has more drainage from gastric port rather than jejunal port. He continues to complain of diffuse abdominal pain which is more or less constant. He had his pain medication refilled last week. He is getting prescription every 2 weeks because liquid preparation is not stable for more than 2 weeks. He is having 1 small stool daily. Stool consistency is mushy. He denies melena or rectal bleeding. Last week he developed scrotal and lower extremity edema. He states his weight was up 290 pounds. He took furosemide and metolazone daily for 3 days and he believes he has lost more than 20 pounds. He has cough with scant amount of sputum. He feels weak and fatigued. Since recent hospitalization he's not been able to do much physical activity. He will get his scheduled blood work later today.    Current Medications: Outpatient Encounter Prescriptions as of 08/12/2015  Medication Sig  . amoxicillin-clavulanate (AUGMENTIN) 875-125 MG per tablet Take 1 tablet by mouth 2 (two) times daily.   . calcium-vitamin D (OSCAL 500/200 D-3) 500-200 MG-UNIT per tablet Take 1 tablet by mouth 2 (two) times daily.  . cloNIDine (CATAPRES) 0.1 MG tablet Take 0.1 mg by mouth daily.   Marland Kitchen dextrose 5 % solution as needed.   . dicyclomine (BENTYL) 10 MG capsule Take 1 capsule (10  mg total) by mouth 3 (three) times daily as needed (stomach cramps).  . feeding supplement, RESOURCE BREEZE, (RESOURCE BREEZE) LIQD Take 1 Container by mouth 3 (three) times daily between meals.  . ferrous gluconate (FERGON) 324 MG tablet Take 324 mg by mouth 3 (three) times daily.  . fish oil-omega-3 fatty acids 1000 MG capsule Take 1 g by mouth 3 (three) times daily.  Marland Kitchen gabapentin (NEURONTIN) 300 MG capsule Take 300 mg by mouth at bedtime.   . metolazone (ZAROXOLYN) 5 MG tablet Take 1 tablet (5 mg total) by mouth 2 (two) times daily.  Marland Kitchen nystatin cream (MYCOSTATIN) Apply 1 application topically 3 (three) times daily as needed for dry skin. Use as directed  . oxyCODONE (ROXICODONE INTENSOL) 20 MG/ML concentrated solution Take 2.5 mLs (50 mg total) by mouth 4 (four) times daily.  . pantoprazole (PROTONIX) 40 MG tablet Take 1 tablet (40 mg total) by mouth 2 (two) times daily.  Marland Kitchen PAXIL 10 MG/5ML suspension Take 10 mg by mouth daily.   . polyethylene glycol (MIRALAX / GLYCOLAX) packet Take 17 g by mouth daily as needed for mild constipation or moderate constipation.   . potassium chloride 20 MEQ/15ML (10%) SOLN Take 30 mLs (40 mEq total) by mouth 3 (three) times daily.  . predniSONE (DELTASONE) 20 MG tablet Take 1 tablet (20 mg total) by mouth daily with breakfast.  . Probiotic Product (ALIGN) 4 MG CAPS Take 4 mg by mouth daily.   . promethazine (PHENERGAN) 25 MG tablet Take 1 tablet (25 mg total) by mouth 2 (two)  times daily as needed. nausea  . propranolol (INDERAL) 10 MG tablet Take 10 mg by mouth 2 (two) times daily.   . sodium chloride 0.9 % infusion as needed.   Marland Kitchen spironolactone (ALDACTONE) 25 MG tablet Take 1 tablet (25 mg total) by mouth 2 (two) times daily.  . sucralfate (CARAFATE) 1 G tablet Take 1 g by mouth 3 (three) times daily before meals.  . [DISCONTINUED] caspofungin 50 mg in sodium chloride 0.9 % 250 mL Inject 50 mg into the vein daily. (Patient not taking: Reported on 08/12/2015)  .  [DISCONTINUED] nystatin-triamcinolone (MYCOLOG II) cream Apply 1 application topically 2 (two) times daily. (Patient not taking: Reported on 08/12/2015)   No facility-administered encounter medications on file as of 08/12/2015.     Objective: Blood pressure 98/66, pulse 62, temperature 98.4 F (36.9 C), temperature source Oral, resp. rate 16, height 5\' 11"  (1.803 m), weight 167 lb 12.8 oz (76.114 kg). Patient is alert and appears to be chronically ill. He appears older than stated age. He is using walker for embolization. Conjunctiva is pale. Sclera is nonicteric Oropharyngeal mucosa is normal. No neck masses or thyromegaly noted. He has right Hickman catheter. Cardiac exam with regular rhythm normal S1 and S2. No murmur or gallop noted. Breath sounds are diminished at left base and few rales noted at right base. Abdomen is full with PEG/PEJ tube in place. He has diffuse abdominal tenderness and abdomen is somewhat firm. He has 1-2+ pitting edema around ankles. Skin peeling noted to both palms.  Labs/studies Results: Recent lab data from [facility] admission reviewed. From 08/07/2015 WBC 3.2, H&H 7.8 and 25.8 and platelet count 52K BUN 21 and creatinine 0.6 Total bilirubin 1.6, AP 145, albumin 2.1 INR 1.2 Chest film from 08/03/2015 revealed small pleural effusions and stable cardiac and mediastinal contours.  Assessment:  #1. 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 He is waiting for liver transplant. #2. 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 Patient has been on parenteral nutrition since December last year and is waiting for transplant. #3. Recent hospitalization for sepsis. Patient remains on Augmentin. #4. Fluid overload. Fluid overload primarily secondary to malnutrition and very low albumen. He is responding to diarrhetic therapy and renal function is well-preserved.   Plan:  New prescription for furosemide 40 mg  by mouth daily when necessary given. Patient will stop by and office in 8 days for oxycodone prescription which he is getting every 2 weeks. Office visit in 3 months.

## 2015-08-12 NOTE — Patient Instructions (Signed)
Use furosemide and metolazone as directed.

## 2015-08-13 DIAGNOSIS — K269 Duodenal ulcer, unspecified as acute or chronic, without hemorrhage or perforation: Secondary | ICD-10-CM | POA: Diagnosis not present

## 2015-08-13 DIAGNOSIS — K50813 Crohn's disease of both small and large intestine with fistula: Secondary | ICD-10-CM | POA: Diagnosis not present

## 2015-08-13 DIAGNOSIS — J9600 Acute respiratory failure, unspecified whether with hypoxia or hypercapnia: Secondary | ICD-10-CM | POA: Diagnosis not present

## 2015-08-13 DIAGNOSIS — K769 Liver disease, unspecified: Secondary | ICD-10-CM | POA: Diagnosis not present

## 2015-08-13 DIAGNOSIS — I10 Essential (primary) hypertension: Secondary | ICD-10-CM | POA: Diagnosis not present

## 2015-08-14 DIAGNOSIS — D649 Anemia, unspecified: Secondary | ICD-10-CM | POA: Diagnosis not present

## 2015-08-14 DIAGNOSIS — Z8701 Personal history of pneumonia (recurrent): Secondary | ICD-10-CM | POA: Diagnosis not present

## 2015-08-14 DIAGNOSIS — Z01818 Encounter for other preprocedural examination: Secondary | ICD-10-CM | POA: Diagnosis not present

## 2015-08-14 DIAGNOSIS — K7460 Unspecified cirrhosis of liver: Secondary | ICD-10-CM | POA: Diagnosis not present

## 2015-08-14 DIAGNOSIS — K50813 Crohn's disease of both small and large intestine with fistula: Secondary | ICD-10-CM | POA: Diagnosis not present

## 2015-08-14 DIAGNOSIS — R627 Adult failure to thrive: Secondary | ICD-10-CM | POA: Diagnosis not present

## 2015-08-14 DIAGNOSIS — K269 Duodenal ulcer, unspecified as acute or chronic, without hemorrhage or perforation: Secondary | ICD-10-CM | POA: Diagnosis not present

## 2015-08-14 DIAGNOSIS — Z8619 Personal history of other infectious and parasitic diseases: Secondary | ICD-10-CM | POA: Diagnosis not present

## 2015-08-14 DIAGNOSIS — E559 Vitamin D deficiency, unspecified: Secondary | ICD-10-CM | POA: Diagnosis not present

## 2015-08-14 DIAGNOSIS — R188 Other ascites: Secondary | ICD-10-CM | POA: Diagnosis not present

## 2015-08-14 DIAGNOSIS — J9600 Acute respiratory failure, unspecified whether with hypoxia or hypercapnia: Secondary | ICD-10-CM | POA: Diagnosis not present

## 2015-08-14 DIAGNOSIS — R600 Localized edema: Secondary | ICD-10-CM | POA: Diagnosis not present

## 2015-08-14 DIAGNOSIS — I10 Essential (primary) hypertension: Secondary | ICD-10-CM | POA: Diagnosis not present

## 2015-08-14 DIAGNOSIS — K50819 Crohn's disease of both small and large intestine with unspecified complications: Secondary | ICD-10-CM | POA: Diagnosis not present

## 2015-08-14 DIAGNOSIS — Z87891 Personal history of nicotine dependence: Secondary | ICD-10-CM | POA: Diagnosis not present

## 2015-08-14 DIAGNOSIS — Z789 Other specified health status: Secondary | ICD-10-CM | POA: Diagnosis not present

## 2015-08-14 DIAGNOSIS — Z713 Dietary counseling and surveillance: Secondary | ICD-10-CM | POA: Diagnosis not present

## 2015-08-14 DIAGNOSIS — K769 Liver disease, unspecified: Secondary | ICD-10-CM | POA: Diagnosis not present

## 2015-08-14 DIAGNOSIS — K50812 Crohn's disease of both small and large intestine with intestinal obstruction: Secondary | ICD-10-CM | POA: Diagnosis not present

## 2015-08-14 DIAGNOSIS — Z931 Gastrostomy status: Secondary | ICD-10-CM | POA: Diagnosis not present

## 2015-08-14 DIAGNOSIS — K432 Incisional hernia without obstruction or gangrene: Secondary | ICD-10-CM | POA: Diagnosis not present

## 2015-08-15 ENCOUNTER — Ambulatory Visit: Payer: Medicare Other | Admitting: Family Medicine

## 2015-08-15 DIAGNOSIS — J9600 Acute respiratory failure, unspecified whether with hypoxia or hypercapnia: Secondary | ICD-10-CM | POA: Diagnosis not present

## 2015-08-15 DIAGNOSIS — K269 Duodenal ulcer, unspecified as acute or chronic, without hemorrhage or perforation: Secondary | ICD-10-CM | POA: Diagnosis not present

## 2015-08-15 DIAGNOSIS — K50813 Crohn's disease of both small and large intestine with fistula: Secondary | ICD-10-CM | POA: Diagnosis not present

## 2015-08-15 DIAGNOSIS — I10 Essential (primary) hypertension: Secondary | ICD-10-CM | POA: Diagnosis not present

## 2015-08-15 DIAGNOSIS — K769 Liver disease, unspecified: Secondary | ICD-10-CM | POA: Diagnosis not present

## 2015-08-16 ENCOUNTER — Encounter (INDEPENDENT_AMBULATORY_CARE_PROVIDER_SITE_OTHER): Payer: Self-pay

## 2015-08-16 DIAGNOSIS — J9600 Acute respiratory failure, unspecified whether with hypoxia or hypercapnia: Secondary | ICD-10-CM | POA: Diagnosis not present

## 2015-08-16 DIAGNOSIS — Z79899 Other long term (current) drug therapy: Secondary | ICD-10-CM | POA: Diagnosis not present

## 2015-08-16 DIAGNOSIS — K769 Liver disease, unspecified: Secondary | ICD-10-CM | POA: Diagnosis not present

## 2015-08-16 DIAGNOSIS — Z431 Encounter for attention to gastrostomy: Secondary | ICD-10-CM | POA: Diagnosis not present

## 2015-08-16 DIAGNOSIS — I10 Essential (primary) hypertension: Secondary | ICD-10-CM | POA: Diagnosis not present

## 2015-08-16 DIAGNOSIS — T8589XA Other specified complication of internal prosthetic devices, implants and grafts, not elsewhere classified, initial encounter: Secondary | ICD-10-CM | POA: Diagnosis not present

## 2015-08-16 DIAGNOSIS — K9429 Other complications of gastrostomy: Secondary | ICD-10-CM | POA: Diagnosis not present

## 2015-08-16 DIAGNOSIS — K269 Duodenal ulcer, unspecified as acute or chronic, without hemorrhage or perforation: Secondary | ICD-10-CM | POA: Diagnosis not present

## 2015-08-16 DIAGNOSIS — K50813 Crohn's disease of both small and large intestine with fistula: Secondary | ICD-10-CM | POA: Diagnosis not present

## 2015-08-17 DIAGNOSIS — K50813 Crohn's disease of both small and large intestine with fistula: Secondary | ICD-10-CM | POA: Diagnosis not present

## 2015-08-17 DIAGNOSIS — K769 Liver disease, unspecified: Secondary | ICD-10-CM | POA: Diagnosis not present

## 2015-08-17 DIAGNOSIS — K269 Duodenal ulcer, unspecified as acute or chronic, without hemorrhage or perforation: Secondary | ICD-10-CM | POA: Diagnosis not present

## 2015-08-17 DIAGNOSIS — J9600 Acute respiratory failure, unspecified whether with hypoxia or hypercapnia: Secondary | ICD-10-CM | POA: Diagnosis not present

## 2015-08-17 DIAGNOSIS — I10 Essential (primary) hypertension: Secondary | ICD-10-CM | POA: Diagnosis not present

## 2015-08-18 DIAGNOSIS — I10 Essential (primary) hypertension: Secondary | ICD-10-CM | POA: Diagnosis not present

## 2015-08-18 DIAGNOSIS — K769 Liver disease, unspecified: Secondary | ICD-10-CM | POA: Diagnosis not present

## 2015-08-18 DIAGNOSIS — K269 Duodenal ulcer, unspecified as acute or chronic, without hemorrhage or perforation: Secondary | ICD-10-CM | POA: Diagnosis not present

## 2015-08-18 DIAGNOSIS — J9600 Acute respiratory failure, unspecified whether with hypoxia or hypercapnia: Secondary | ICD-10-CM | POA: Diagnosis not present

## 2015-08-18 DIAGNOSIS — K50813 Crohn's disease of both small and large intestine with fistula: Secondary | ICD-10-CM | POA: Diagnosis not present

## 2015-08-19 ENCOUNTER — Other Ambulatory Visit (HOSPITAL_COMMUNITY)
Admission: RE | Admit: 2015-08-19 | Discharge: 2015-08-19 | Disposition: A | Discharge status: Home / Self Care | Payer: Medicare Other | Source: Other Acute Inpatient Hospital | Attending: Internal Medicine | Admitting: Internal Medicine

## 2015-08-19 DIAGNOSIS — Z5181 Encounter for therapeutic drug level monitoring: Secondary | ICD-10-CM | POA: Diagnosis not present

## 2015-08-19 DIAGNOSIS — K769 Liver disease, unspecified: Secondary | ICD-10-CM | POA: Diagnosis not present

## 2015-08-19 DIAGNOSIS — J9600 Acute respiratory failure, unspecified whether with hypoxia or hypercapnia: Secondary | ICD-10-CM | POA: Diagnosis not present

## 2015-08-19 DIAGNOSIS — K269 Duodenal ulcer, unspecified as acute or chronic, without hemorrhage or perforation: Secondary | ICD-10-CM | POA: Diagnosis not present

## 2015-08-19 DIAGNOSIS — K50813 Crohn's disease of both small and large intestine with fistula: Secondary | ICD-10-CM | POA: Diagnosis not present

## 2015-08-19 DIAGNOSIS — Z452 Encounter for adjustment and management of vascular access device: Secondary | ICD-10-CM | POA: Insufficient documentation

## 2015-08-19 DIAGNOSIS — K50912 Crohn's disease, unspecified, with intestinal obstruction: Secondary | ICD-10-CM | POA: Insufficient documentation

## 2015-08-19 DIAGNOSIS — I10 Essential (primary) hypertension: Secondary | ICD-10-CM | POA: Diagnosis not present

## 2015-08-19 LAB — COMPREHENSIVE METABOLIC PANEL
ALBUMIN: 2.6 g/dL — AB (ref 3.5–5.0)
ALT: 23 U/L (ref 17–63)
AST: 42 U/L — AB (ref 15–41)
Alkaline Phosphatase: 195 U/L — ABNORMAL HIGH (ref 38–126)
Anion gap: 7 (ref 5–15)
BUN: 22 mg/dL — AB (ref 6–20)
CHLORIDE: 89 mmol/L — AB (ref 101–111)
CO2: 36 mmol/L — AB (ref 22–32)
Calcium: 7.8 mg/dL — ABNORMAL LOW (ref 8.9–10.3)
Creatinine, Ser: 0.62 mg/dL (ref 0.61–1.24)
GFR calc Af Amer: >60 mL/min (ref >60)
GLUCOSE: 116 mg/dL — AB (ref 65–99)
Potassium: 3.4 mmol/L — ABNORMAL LOW (ref 3.5–5.1)
Sodium: 132 mmol/L — ABNORMAL LOW (ref 135–145)
Total Bilirubin: 2.4 mg/dL — ABNORMAL HIGH (ref 0.3–1.2)
Total Protein: 5.9 g/dL — ABNORMAL LOW (ref 6.5–8.1)

## 2015-08-19 LAB — CBC WITH DIFFERENTIAL/PLATELET
Basophils Absolute: 0 10*3/uL (ref 0.0–0.1)
Basophils Relative: 0 % (ref 0–1)
Eosinophils Absolute: 0.1 10*3/uL (ref 0.0–0.7)
Eosinophils Relative: 3 % (ref 0–5)
HEMATOCRIT: 27.3 % — AB (ref 39.0–52.0)
HEMOGLOBIN: 9.1 g/dL — AB (ref 13.0–17.0)
LYMPHS PCT: 11 % — AB (ref 12–46)
Lymphs Abs: 0.5 10*3/uL — ABNORMAL LOW (ref 0.7–4.0)
MCH: 29.5 pg (ref 26.0–34.0)
MCHC: 33.3 g/dL (ref 30.0–36.0)
MCV: 88.6 fL (ref 78.0–100.0)
MONOS PCT: 14 % — AB (ref 3–12)
Monocytes Absolute: 0.6 10*3/uL (ref 0.1–1.0)
NEUTROS ABS: 3.3 10*3/uL (ref 1.7–7.7)
Neutrophils Relative %: 73 % (ref 43–77)
Platelets: 69 10*3/uL — ABNORMAL LOW (ref 150–400)
RBC: 3.08 MIL/uL — ABNORMAL LOW (ref 4.22–5.81)
RDW: 19.4 % — AB (ref 11.5–15.5)
WBC: 4.6 10*3/uL (ref 4.0–10.5)

## 2015-08-19 LAB — PROTIME-INR
INR: 1.3 (ref 0.00–1.49)
Prothrombin Time: 16.3 seconds — ABNORMAL HIGH (ref 11.6–15.2)

## 2015-08-19 LAB — PHOSPHORUS: Phosphorus: 3.9 mg/dL (ref 2.5–4.6)

## 2015-08-19 LAB — MAGNESIUM: MAGNESIUM: 2.2 mg/dL (ref 1.7–2.4)

## 2015-08-19 LAB — TRIGLYCERIDES: Triglycerides: 75 mg/dL (ref <150)

## 2015-08-20 ENCOUNTER — Other Ambulatory Visit (INDEPENDENT_AMBULATORY_CARE_PROVIDER_SITE_OTHER): Payer: Self-pay | Admitting: *Deleted

## 2015-08-20 DIAGNOSIS — J9600 Acute respiratory failure, unspecified whether with hypoxia or hypercapnia: Secondary | ICD-10-CM | POA: Diagnosis not present

## 2015-08-20 DIAGNOSIS — K769 Liver disease, unspecified: Secondary | ICD-10-CM | POA: Diagnosis not present

## 2015-08-20 DIAGNOSIS — I10 Essential (primary) hypertension: Secondary | ICD-10-CM | POA: Diagnosis not present

## 2015-08-20 DIAGNOSIS — K50813 Crohn's disease of both small and large intestine with fistula: Secondary | ICD-10-CM | POA: Diagnosis not present

## 2015-08-20 DIAGNOSIS — K269 Duodenal ulcer, unspecified as acute or chronic, without hemorrhage or perforation: Secondary | ICD-10-CM | POA: Diagnosis not present

## 2015-08-20 MED ORDER — OXYCODONE HCL 20 MG/ML PO CONC: 50.0000 mg | Freq: Four times a day (QID) | ORAL | Status: DC

## 2015-08-20 NOTE — Telephone Encounter (Signed)
Patient' s last prescription was written on 08/06/15. It has been 2 weeks. His Mother, David [name] , will be picking up.

## 2015-08-21 DIAGNOSIS — I10 Essential (primary) hypertension: Secondary | ICD-10-CM | POA: Diagnosis not present

## 2015-08-21 DIAGNOSIS — K769 Liver disease, unspecified: Secondary | ICD-10-CM | POA: Diagnosis not present

## 2015-08-21 DIAGNOSIS — K50813 Crohn's disease of both small and large intestine with fistula: Secondary | ICD-10-CM | POA: Diagnosis not present

## 2015-08-21 DIAGNOSIS — K269 Duodenal ulcer, unspecified as acute or chronic, without hemorrhage or perforation: Secondary | ICD-10-CM | POA: Diagnosis not present

## 2015-08-21 DIAGNOSIS — J9600 Acute respiratory failure, unspecified whether with hypoxia or hypercapnia: Secondary | ICD-10-CM | POA: Diagnosis not present

## 2015-08-22 DIAGNOSIS — K769 Liver disease, unspecified: Secondary | ICD-10-CM | POA: Diagnosis not present

## 2015-08-22 DIAGNOSIS — K50813 Crohn's disease of both small and large intestine with fistula: Secondary | ICD-10-CM | POA: Diagnosis not present

## 2015-08-22 DIAGNOSIS — J9600 Acute respiratory failure, unspecified whether with hypoxia or hypercapnia: Secondary | ICD-10-CM | POA: Diagnosis not present

## 2015-08-22 DIAGNOSIS — K269 Duodenal ulcer, unspecified as acute or chronic, without hemorrhage or perforation: Secondary | ICD-10-CM | POA: Diagnosis not present

## 2015-08-22 DIAGNOSIS — I10 Essential (primary) hypertension: Secondary | ICD-10-CM | POA: Diagnosis not present

## 2015-08-23 DIAGNOSIS — K50813 Crohn's disease of both small and large intestine with fistula: Secondary | ICD-10-CM | POA: Diagnosis not present

## 2015-08-23 DIAGNOSIS — K269 Duodenal ulcer, unspecified as acute or chronic, without hemorrhage or perforation: Secondary | ICD-10-CM | POA: Diagnosis not present

## 2015-08-23 DIAGNOSIS — J9600 Acute respiratory failure, unspecified whether with hypoxia or hypercapnia: Secondary | ICD-10-CM | POA: Diagnosis not present

## 2015-08-23 DIAGNOSIS — K769 Liver disease, unspecified: Secondary | ICD-10-CM | POA: Diagnosis not present

## 2015-08-23 DIAGNOSIS — I10 Essential (primary) hypertension: Secondary | ICD-10-CM | POA: Diagnosis not present

## 2015-08-24 DIAGNOSIS — K269 Duodenal ulcer, unspecified as acute or chronic, without hemorrhage or perforation: Secondary | ICD-10-CM | POA: Diagnosis not present

## 2015-08-24 DIAGNOSIS — K50813 Crohn's disease of both small and large intestine with fistula: Secondary | ICD-10-CM | POA: Diagnosis not present

## 2015-08-24 DIAGNOSIS — K769 Liver disease, unspecified: Secondary | ICD-10-CM | POA: Diagnosis not present

## 2015-08-24 DIAGNOSIS — I10 Essential (primary) hypertension: Secondary | ICD-10-CM | POA: Diagnosis not present

## 2015-08-24 DIAGNOSIS — J9600 Acute respiratory failure, unspecified whether with hypoxia or hypercapnia: Secondary | ICD-10-CM | POA: Diagnosis not present

## 2015-08-25 DIAGNOSIS — J9600 Acute respiratory failure, unspecified whether with hypoxia or hypercapnia: Secondary | ICD-10-CM | POA: Diagnosis not present

## 2015-08-25 DIAGNOSIS — K269 Duodenal ulcer, unspecified as acute or chronic, without hemorrhage or perforation: Secondary | ICD-10-CM | POA: Diagnosis not present

## 2015-08-25 DIAGNOSIS — I10 Essential (primary) hypertension: Secondary | ICD-10-CM | POA: Diagnosis not present

## 2015-08-25 DIAGNOSIS — K769 Liver disease, unspecified: Secondary | ICD-10-CM | POA: Diagnosis not present

## 2015-08-25 DIAGNOSIS — K50813 Crohn's disease of both small and large intestine with fistula: Secondary | ICD-10-CM | POA: Diagnosis not present

## 2015-08-26 DIAGNOSIS — Z452 Encounter for adjustment and management of vascular access device: Secondary | ICD-10-CM | POA: Diagnosis not present

## 2015-08-26 DIAGNOSIS — I10 Essential (primary) hypertension: Secondary | ICD-10-CM | POA: Diagnosis not present

## 2015-08-26 DIAGNOSIS — K769 Liver disease, unspecified: Secondary | ICD-10-CM | POA: Diagnosis not present

## 2015-08-26 DIAGNOSIS — Z5181 Encounter for therapeutic drug level monitoring: Secondary | ICD-10-CM | POA: Diagnosis not present

## 2015-08-26 DIAGNOSIS — K269 Duodenal ulcer, unspecified as acute or chronic, without hemorrhage or perforation: Secondary | ICD-10-CM | POA: Diagnosis not present

## 2015-08-26 DIAGNOSIS — K50912 Crohn's disease, unspecified, with intestinal obstruction: Secondary | ICD-10-CM | POA: Diagnosis not present

## 2015-08-26 DIAGNOSIS — K50813 Crohn's disease of both small and large intestine with fistula: Secondary | ICD-10-CM | POA: Diagnosis not present

## 2015-08-26 DIAGNOSIS — J9600 Acute respiratory failure, unspecified whether with hypoxia or hypercapnia: Secondary | ICD-10-CM | POA: Diagnosis not present

## 2015-08-27 DIAGNOSIS — J9600 Acute respiratory failure, unspecified whether with hypoxia or hypercapnia: Secondary | ICD-10-CM | POA: Diagnosis not present

## 2015-08-27 DIAGNOSIS — K769 Liver disease, unspecified: Secondary | ICD-10-CM | POA: Diagnosis not present

## 2015-08-27 DIAGNOSIS — I10 Essential (primary) hypertension: Secondary | ICD-10-CM | POA: Diagnosis not present

## 2015-08-27 DIAGNOSIS — K269 Duodenal ulcer, unspecified as acute or chronic, without hemorrhage or perforation: Secondary | ICD-10-CM | POA: Diagnosis not present

## 2015-08-27 DIAGNOSIS — K50813 Crohn's disease of both small and large intestine with fistula: Secondary | ICD-10-CM | POA: Diagnosis not present

## 2015-08-28 ENCOUNTER — Telehealth (INDEPENDENT_AMBULATORY_CARE_PROVIDER_SITE_OTHER): Payer: Self-pay | Admitting: *Deleted

## 2015-08-28 DIAGNOSIS — M7989 Other specified soft tissue disorders: Secondary | ICD-10-CM | POA: Diagnosis not present

## 2015-08-28 DIAGNOSIS — M00831 Arthritis due to other bacteria, right wrist: Secondary | ICD-10-CM | POA: Diagnosis not present

## 2015-08-28 DIAGNOSIS — N179 Acute kidney failure, unspecified: Secondary | ICD-10-CM | POA: Diagnosis not present

## 2015-08-28 DIAGNOSIS — M19031 Primary osteoarthritis, right wrist: Secondary | ICD-10-CM | POA: Diagnosis not present

## 2015-08-28 DIAGNOSIS — F1190 Opioid use, unspecified, uncomplicated: Secondary | ICD-10-CM | POA: Diagnosis not present

## 2015-08-28 DIAGNOSIS — R109 Unspecified abdominal pain: Secondary | ICD-10-CM | POA: Diagnosis not present

## 2015-08-28 DIAGNOSIS — R1011 Right upper quadrant pain: Secondary | ICD-10-CM | POA: Diagnosis not present

## 2015-08-28 DIAGNOSIS — D509 Iron deficiency anemia, unspecified: Secondary | ICD-10-CM | POA: Diagnosis not present

## 2015-08-28 DIAGNOSIS — J9621 Acute and chronic respiratory failure with hypoxia: Secondary | ICD-10-CM | POA: Diagnosis not present

## 2015-08-28 DIAGNOSIS — Z515 Encounter for palliative care: Secondary | ICD-10-CM | POA: Diagnosis not present

## 2015-08-28 DIAGNOSIS — Z931 Gastrostomy status: Secondary | ICD-10-CM | POA: Diagnosis not present

## 2015-08-28 DIAGNOSIS — K766 Portal hypertension: Secondary | ICD-10-CM | POA: Diagnosis not present

## 2015-08-28 DIAGNOSIS — R05 Cough: Secondary | ICD-10-CM | POA: Diagnosis not present

## 2015-08-28 DIAGNOSIS — R0602 Shortness of breath: Secondary | ICD-10-CM | POA: Diagnosis not present

## 2015-08-28 DIAGNOSIS — M79601 Pain in right arm: Secondary | ICD-10-CM | POA: Diagnosis not present

## 2015-08-28 DIAGNOSIS — R161 Splenomegaly, not elsewhere classified: Secondary | ICD-10-CM | POA: Diagnosis not present

## 2015-08-28 DIAGNOSIS — R0600 Dyspnea, unspecified: Secondary | ICD-10-CM | POA: Diagnosis not present

## 2015-08-28 DIAGNOSIS — T451X5A Adverse effect of antineoplastic and immunosuppressive drugs, initial encounter: Secondary | ICD-10-CM | POA: Diagnosis not present

## 2015-08-28 DIAGNOSIS — M810 Age-related osteoporosis without current pathological fracture: Secondary | ICD-10-CM | POA: Diagnosis not present

## 2015-08-28 DIAGNOSIS — M009 Pyogenic arthritis, unspecified: Secondary | ICD-10-CM | POA: Diagnosis not present

## 2015-08-28 DIAGNOSIS — K2210 Ulcer of esophagus without bleeding: Secondary | ICD-10-CM | POA: Diagnosis not present

## 2015-08-28 DIAGNOSIS — K219 Gastro-esophageal reflux disease without esophagitis: Secondary | ICD-10-CM | POA: Diagnosis not present

## 2015-08-28 DIAGNOSIS — I10 Essential (primary) hypertension: Secondary | ICD-10-CM | POA: Diagnosis not present

## 2015-08-28 DIAGNOSIS — R609 Edema, unspecified: Secondary | ICD-10-CM | POA: Diagnosis not present

## 2015-08-28 DIAGNOSIS — J9691 Respiratory failure, unspecified with hypoxia: Secondary | ICD-10-CM | POA: Diagnosis not present

## 2015-08-28 DIAGNOSIS — D649 Anemia, unspecified: Secondary | ICD-10-CM | POA: Diagnosis not present

## 2015-08-28 DIAGNOSIS — E871 Hypo-osmolality and hyponatremia: Secondary | ICD-10-CM | POA: Diagnosis not present

## 2015-08-28 DIAGNOSIS — A498 Other bacterial infections of unspecified site: Secondary | ICD-10-CM | POA: Diagnosis not present

## 2015-08-28 DIAGNOSIS — T80219A Unspecified infection due to central venous catheter, initial encounter: Secondary | ICD-10-CM | POA: Diagnosis not present

## 2015-08-28 DIAGNOSIS — M25511 Pain in right shoulder: Secondary | ICD-10-CM | POA: Diagnosis not present

## 2015-08-28 DIAGNOSIS — Z789 Other specified health status: Secondary | ICD-10-CM | POA: Diagnosis not present

## 2015-08-28 DIAGNOSIS — J690 Pneumonitis due to inhalation of food and vomit: Secondary | ICD-10-CM | POA: Diagnosis not present

## 2015-08-28 DIAGNOSIS — K3189 Other diseases of stomach and duodenum: Secondary | ICD-10-CM | POA: Diagnosis not present

## 2015-08-28 DIAGNOSIS — R918 Other nonspecific abnormal finding of lung field: Secondary | ICD-10-CM | POA: Diagnosis not present

## 2015-08-28 DIAGNOSIS — J9600 Acute respiratory failure, unspecified whether with hypoxia or hypercapnia: Secondary | ICD-10-CM | POA: Diagnosis not present

## 2015-08-28 DIAGNOSIS — R64 Cachexia: Secondary | ICD-10-CM | POA: Diagnosis not present

## 2015-08-28 DIAGNOSIS — D899 Disorder involving the immune mechanism, unspecified: Secondary | ICD-10-CM | POA: Diagnosis not present

## 2015-08-28 DIAGNOSIS — K717 Toxic liver disease with fibrosis and cirrhosis of liver: Secondary | ICD-10-CM | POA: Diagnosis not present

## 2015-08-28 DIAGNOSIS — M6289 Other specified disorders of muscle: Secondary | ICD-10-CM | POA: Diagnosis not present

## 2015-08-28 DIAGNOSIS — Z87442 Personal history of urinary calculi: Secondary | ICD-10-CM | POA: Diagnosis not present

## 2015-08-28 DIAGNOSIS — R5381 Other malaise: Secondary | ICD-10-CM | POA: Diagnosis not present

## 2015-08-28 DIAGNOSIS — K7460 Unspecified cirrhosis of liver: Secondary | ICD-10-CM | POA: Diagnosis not present

## 2015-08-28 DIAGNOSIS — R0689 Other abnormalities of breathing: Secondary | ICD-10-CM | POA: Diagnosis not present

## 2015-08-28 DIAGNOSIS — K769 Liver disease, unspecified: Secondary | ICD-10-CM | POA: Diagnosis not present

## 2015-08-28 DIAGNOSIS — K269 Duodenal ulcer, unspecified as acute or chronic, without hemorrhage or perforation: Secondary | ICD-10-CM | POA: Diagnosis not present

## 2015-08-28 DIAGNOSIS — R7881 Bacteremia: Secondary | ICD-10-CM | POA: Diagnosis not present

## 2015-08-28 DIAGNOSIS — D61818 Other pancytopenia: Secondary | ICD-10-CM | POA: Diagnosis not present

## 2015-08-28 DIAGNOSIS — M25411 Effusion, right shoulder: Secondary | ICD-10-CM | POA: Diagnosis not present

## 2015-08-28 DIAGNOSIS — K50019 Crohn's disease of small intestine with unspecified complications: Secondary | ICD-10-CM | POA: Diagnosis not present

## 2015-08-28 DIAGNOSIS — K5080 Crohn's disease of both small and large intestine without complications: Secondary | ICD-10-CM | POA: Diagnosis not present

## 2015-08-28 DIAGNOSIS — R52 Pain, unspecified: Secondary | ICD-10-CM | POA: Diagnosis not present

## 2015-08-28 DIAGNOSIS — K50813 Crohn's disease of both small and large intestine with fistula: Secondary | ICD-10-CM | POA: Diagnosis not present

## 2015-08-28 DIAGNOSIS — K3184 Gastroparesis: Secondary | ICD-10-CM | POA: Diagnosis not present

## 2015-08-28 DIAGNOSIS — K9423 Gastrostomy malfunction: Secondary | ICD-10-CM | POA: Diagnosis not present

## 2015-08-28 DIAGNOSIS — I8500 Esophageal varices without bleeding: Secondary | ICD-10-CM | POA: Diagnosis not present

## 2015-08-28 DIAGNOSIS — T80211A Bloodstream infection due to central venous catheter, initial encounter: Secondary | ICD-10-CM | POA: Diagnosis not present

## 2015-08-28 DIAGNOSIS — M25531 Pain in right wrist: Secondary | ICD-10-CM | POA: Diagnosis not present

## 2015-08-28 DIAGNOSIS — F329 Major depressive disorder, single episode, unspecified: Secondary | ICD-10-CM | POA: Diagnosis not present

## 2015-08-28 DIAGNOSIS — R509 Fever, unspecified: Secondary | ICD-10-CM | POA: Diagnosis not present

## 2015-08-28 DIAGNOSIS — A310 Pulmonary mycobacterial infection: Secondary | ICD-10-CM | POA: Diagnosis not present

## 2015-08-28 DIAGNOSIS — K912 Postsurgical malabsorption, not elsewhere classified: Secondary | ICD-10-CM | POA: Diagnosis not present

## 2015-08-28 DIAGNOSIS — R188 Other ascites: Secondary | ICD-10-CM | POA: Diagnosis not present

## 2015-08-28 DIAGNOSIS — G894 Chronic pain syndrome: Secondary | ICD-10-CM | POA: Diagnosis not present

## 2015-08-28 NOTE — Telephone Encounter (Signed)
Patient was called and made aware that labs were rec'd and that we were going to order T&C for 2 units of blood. Patient states that he is heading to [facility] ED. He says that he is weak and short of breath. He ask that we hold off sending the order over and that he would have someone call us with an update.

## 2015-08-29 DIAGNOSIS — K769 Liver disease, unspecified: Secondary | ICD-10-CM | POA: Diagnosis not present

## 2015-08-29 DIAGNOSIS — I10 Essential (primary) hypertension: Secondary | ICD-10-CM | POA: Diagnosis not present

## 2015-08-29 DIAGNOSIS — K50813 Crohn's disease of both small and large intestine with fistula: Secondary | ICD-10-CM | POA: Diagnosis not present

## 2015-08-29 DIAGNOSIS — K269 Duodenal ulcer, unspecified as acute or chronic, without hemorrhage or perforation: Secondary | ICD-10-CM | POA: Diagnosis not present

## 2015-08-29 DIAGNOSIS — J9600 Acute respiratory failure, unspecified whether with hypoxia or hypercapnia: Secondary | ICD-10-CM | POA: Diagnosis not present

## 2015-08-29 NOTE — Telephone Encounter (Signed)
Patient has been admitted to [facility] per home health nurse, Wyatt Mage.

## 2015-08-30 DIAGNOSIS — K50813 Crohn's disease of both small and large intestine with fistula: Secondary | ICD-10-CM | POA: Diagnosis not present

## 2015-08-30 DIAGNOSIS — I10 Essential (primary) hypertension: Secondary | ICD-10-CM | POA: Diagnosis not present

## 2015-08-30 DIAGNOSIS — K769 Liver disease, unspecified: Secondary | ICD-10-CM | POA: Diagnosis not present

## 2015-08-30 DIAGNOSIS — J9600 Acute respiratory failure, unspecified whether with hypoxia or hypercapnia: Secondary | ICD-10-CM | POA: Diagnosis not present

## 2015-08-30 DIAGNOSIS — K269 Duodenal ulcer, unspecified as acute or chronic, without hemorrhage or perforation: Secondary | ICD-10-CM | POA: Diagnosis not present

## 2015-09-07 DIAGNOSIS — K769 Liver disease, unspecified: Secondary | ICD-10-CM | POA: Diagnosis not present

## 2015-09-07 DIAGNOSIS — I10 Essential (primary) hypertension: Secondary | ICD-10-CM | POA: Diagnosis not present

## 2015-09-07 DIAGNOSIS — J9600 Acute respiratory failure, unspecified whether with hypoxia or hypercapnia: Secondary | ICD-10-CM | POA: Diagnosis not present

## 2015-09-07 DIAGNOSIS — K269 Duodenal ulcer, unspecified as acute or chronic, without hemorrhage or perforation: Secondary | ICD-10-CM | POA: Diagnosis not present

## 2015-09-07 DIAGNOSIS — K50813 Crohn's disease of both small and large intestine with fistula: Secondary | ICD-10-CM | POA: Diagnosis not present

## 2015-09-17 DIAGNOSIS — J9600 Acute respiratory failure, unspecified whether with hypoxia or hypercapnia: Secondary | ICD-10-CM | POA: Diagnosis not present

## 2015-09-17 DIAGNOSIS — K50813 Crohn's disease of both small and large intestine with fistula: Secondary | ICD-10-CM | POA: Diagnosis not present

## 2015-09-17 DIAGNOSIS — K769 Liver disease, unspecified: Secondary | ICD-10-CM | POA: Diagnosis not present

## 2015-09-17 DIAGNOSIS — I10 Essential (primary) hypertension: Secondary | ICD-10-CM | POA: Diagnosis not present

## 2015-09-17 DIAGNOSIS — K269 Duodenal ulcer, unspecified as acute or chronic, without hemorrhage or perforation: Secondary | ICD-10-CM | POA: Diagnosis not present

## 2015-09-18 ENCOUNTER — Telehealth (INDEPENDENT_AMBULATORY_CARE_PROVIDER_SITE_OTHER): Payer: Self-pay | Admitting: *Deleted

## 2015-09-18 DIAGNOSIS — K3184 Gastroparesis: Secondary | ICD-10-CM | POA: Diagnosis not present

## 2015-09-18 DIAGNOSIS — R7881 Bacteremia: Secondary | ICD-10-CM | POA: Diagnosis not present

## 2015-09-18 NOTE — Telephone Encounter (Signed)
Rec'd a call/voicemail this morning from Aleda Grana RN , [facility]. She states that the patient was not d/c until last night. [facility] offered to come out to the home but the wife declined. She felt that the patient would be to tired for this. Okey Regal states that everything is in place and that [facility] is to resume care this morning.

## 2015-09-20 DIAGNOSIS — K769 Liver disease, unspecified: Secondary | ICD-10-CM | POA: Diagnosis not present

## 2015-09-20 DIAGNOSIS — J9600 Acute respiratory failure, unspecified whether with hypoxia or hypercapnia: Secondary | ICD-10-CM | POA: Diagnosis not present

## 2015-09-20 DIAGNOSIS — I10 Essential (primary) hypertension: Secondary | ICD-10-CM | POA: Diagnosis not present

## 2015-09-20 DIAGNOSIS — K269 Duodenal ulcer, unspecified as acute or chronic, without hemorrhage or perforation: Secondary | ICD-10-CM | POA: Diagnosis not present

## 2015-09-20 DIAGNOSIS — K50813 Crohn's disease of both small and large intestine with fistula: Secondary | ICD-10-CM | POA: Diagnosis not present

## 2015-09-21 DIAGNOSIS — K769 Liver disease, unspecified: Secondary | ICD-10-CM | POA: Diagnosis not present

## 2015-09-21 DIAGNOSIS — K269 Duodenal ulcer, unspecified as acute or chronic, without hemorrhage or perforation: Secondary | ICD-10-CM | POA: Diagnosis not present

## 2015-09-21 DIAGNOSIS — J9600 Acute respiratory failure, unspecified whether with hypoxia or hypercapnia: Secondary | ICD-10-CM | POA: Diagnosis not present

## 2015-09-21 DIAGNOSIS — K50813 Crohn's disease of both small and large intestine with fistula: Secondary | ICD-10-CM | POA: Diagnosis not present

## 2015-09-21 DIAGNOSIS — I10 Essential (primary) hypertension: Secondary | ICD-10-CM | POA: Diagnosis not present

## 2015-09-22 DIAGNOSIS — K50813 Crohn's disease of both small and large intestine with fistula: Secondary | ICD-10-CM | POA: Diagnosis not present

## 2015-09-22 DIAGNOSIS — J9600 Acute respiratory failure, unspecified whether with hypoxia or hypercapnia: Secondary | ICD-10-CM | POA: Diagnosis not present

## 2015-09-22 DIAGNOSIS — K769 Liver disease, unspecified: Secondary | ICD-10-CM | POA: Diagnosis not present

## 2015-09-22 DIAGNOSIS — K269 Duodenal ulcer, unspecified as acute or chronic, without hemorrhage or perforation: Secondary | ICD-10-CM | POA: Diagnosis not present

## 2015-09-22 DIAGNOSIS — I10 Essential (primary) hypertension: Secondary | ICD-10-CM | POA: Diagnosis not present

## 2015-09-23 DIAGNOSIS — J9600 Acute respiratory failure, unspecified whether with hypoxia or hypercapnia: Secondary | ICD-10-CM | POA: Diagnosis not present

## 2015-09-23 DIAGNOSIS — E43 Unspecified severe protein-calorie malnutrition: Secondary | ICD-10-CM | POA: Diagnosis not present

## 2015-09-23 DIAGNOSIS — K3184 Gastroparesis: Secondary | ICD-10-CM | POA: Diagnosis not present

## 2015-09-23 DIAGNOSIS — K269 Duodenal ulcer, unspecified as acute or chronic, without hemorrhage or perforation: Secondary | ICD-10-CM | POA: Diagnosis not present

## 2015-09-23 DIAGNOSIS — R7881 Bacteremia: Secondary | ICD-10-CM | POA: Diagnosis not present

## 2015-09-23 DIAGNOSIS — K50813 Crohn's disease of both small and large intestine with fistula: Secondary | ICD-10-CM | POA: Diagnosis not present

## 2015-09-23 DIAGNOSIS — Z452 Encounter for adjustment and management of vascular access device: Secondary | ICD-10-CM | POA: Diagnosis not present

## 2015-09-23 DIAGNOSIS — Z5181 Encounter for therapeutic drug level monitoring: Secondary | ICD-10-CM | POA: Diagnosis not present

## 2015-09-23 DIAGNOSIS — K769 Liver disease, unspecified: Secondary | ICD-10-CM | POA: Diagnosis not present

## 2015-09-23 DIAGNOSIS — I10 Essential (primary) hypertension: Secondary | ICD-10-CM | POA: Diagnosis not present

## 2015-09-24 DIAGNOSIS — K769 Liver disease, unspecified: Secondary | ICD-10-CM | POA: Diagnosis not present

## 2015-09-24 DIAGNOSIS — R7881 Bacteremia: Secondary | ICD-10-CM | POA: Diagnosis not present

## 2015-09-24 DIAGNOSIS — K50813 Crohn's disease of both small and large intestine with fistula: Secondary | ICD-10-CM | POA: Diagnosis not present

## 2015-09-24 DIAGNOSIS — I10 Essential (primary) hypertension: Secondary | ICD-10-CM | POA: Diagnosis not present

## 2015-09-24 DIAGNOSIS — K3184 Gastroparesis: Secondary | ICD-10-CM | POA: Diagnosis not present

## 2015-09-24 DIAGNOSIS — J9600 Acute respiratory failure, unspecified whether with hypoxia or hypercapnia: Secondary | ICD-10-CM | POA: Diagnosis not present

## 2015-09-24 DIAGNOSIS — K269 Duodenal ulcer, unspecified as acute or chronic, without hemorrhage or perforation: Secondary | ICD-10-CM | POA: Diagnosis not present

## 2015-09-25 DIAGNOSIS — K269 Duodenal ulcer, unspecified as acute or chronic, without hemorrhage or perforation: Secondary | ICD-10-CM | POA: Diagnosis not present

## 2015-09-25 DIAGNOSIS — K769 Liver disease, unspecified: Secondary | ICD-10-CM | POA: Diagnosis not present

## 2015-09-25 DIAGNOSIS — K50813 Crohn's disease of both small and large intestine with fistula: Secondary | ICD-10-CM | POA: Diagnosis not present

## 2015-09-25 DIAGNOSIS — J9600 Acute respiratory failure, unspecified whether with hypoxia or hypercapnia: Secondary | ICD-10-CM | POA: Diagnosis not present

## 2015-09-25 DIAGNOSIS — I10 Essential (primary) hypertension: Secondary | ICD-10-CM | POA: Diagnosis not present

## 2015-09-26 ENCOUNTER — Encounter (HOSPITAL_COMMUNITY)
Admission: RE | Admit: 2015-09-26 | Discharge: 2015-09-26 | Disposition: A | Discharge status: Home / Self Care | Payer: Medicare Other | Source: Ambulatory Visit | Attending: Internal Medicine | Admitting: Internal Medicine

## 2015-09-26 DIAGNOSIS — K50813 Crohn's disease of both small and large intestine with fistula: Secondary | ICD-10-CM | POA: Diagnosis not present

## 2015-09-26 DIAGNOSIS — D509 Iron deficiency anemia, unspecified: Secondary | ICD-10-CM | POA: Diagnosis not present

## 2015-09-26 DIAGNOSIS — K269 Duodenal ulcer, unspecified as acute or chronic, without hemorrhage or perforation: Secondary | ICD-10-CM | POA: Diagnosis not present

## 2015-09-26 DIAGNOSIS — I10 Essential (primary) hypertension: Secondary | ICD-10-CM | POA: Diagnosis not present

## 2015-09-26 DIAGNOSIS — K5090 Crohn's disease, unspecified, without complications: Secondary | ICD-10-CM | POA: Insufficient documentation

## 2015-09-26 DIAGNOSIS — J9600 Acute respiratory failure, unspecified whether with hypoxia or hypercapnia: Secondary | ICD-10-CM | POA: Diagnosis not present

## 2015-09-26 DIAGNOSIS — K769 Liver disease, unspecified: Secondary | ICD-10-CM | POA: Diagnosis not present

## 2015-09-26 LAB — HEMOGLOBIN AND HEMATOCRIT, BLOOD
HCT: 24.9 % — ABNORMAL LOW (ref 39.0–52.0)
Hemoglobin: 8.6 g/dL — ABNORMAL LOW (ref 13.0–17.0)

## 2015-09-26 LAB — PREPARE RBC (CROSSMATCH)

## 2015-09-27 ENCOUNTER — Encounter (HOSPITAL_COMMUNITY)
Admission: RE | Admit: 2015-09-27 | Discharge: 2015-09-27 | Disposition: A | Discharge status: Home / Self Care | Payer: Medicare Other | Source: Ambulatory Visit | Attending: Internal Medicine | Admitting: Internal Medicine

## 2015-09-27 DIAGNOSIS — K5090 Crohn's disease, unspecified, without complications: Secondary | ICD-10-CM | POA: Diagnosis not present

## 2015-09-27 DIAGNOSIS — K50813 Crohn's disease of both small and large intestine with fistula: Secondary | ICD-10-CM | POA: Diagnosis not present

## 2015-09-27 DIAGNOSIS — D509 Iron deficiency anemia, unspecified: Secondary | ICD-10-CM | POA: Diagnosis not present

## 2015-09-27 DIAGNOSIS — I10 Essential (primary) hypertension: Secondary | ICD-10-CM | POA: Diagnosis not present

## 2015-09-27 DIAGNOSIS — K269 Duodenal ulcer, unspecified as acute or chronic, without hemorrhage or perforation: Secondary | ICD-10-CM | POA: Diagnosis not present

## 2015-09-27 DIAGNOSIS — K769 Liver disease, unspecified: Secondary | ICD-10-CM | POA: Diagnosis not present

## 2015-09-27 DIAGNOSIS — J9600 Acute respiratory failure, unspecified whether with hypoxia or hypercapnia: Secondary | ICD-10-CM | POA: Diagnosis not present

## 2015-09-27 LAB — HEMOGLOBIN AND HEMATOCRIT, BLOOD
HCT: 26.7 % — ABNORMAL LOW (ref 39.0–52.0)
HEMOGLOBIN: 9.3 g/dL — AB (ref 13.0–17.0)

## 2015-09-27 MED ORDER — SODIUM CHLORIDE 0.9 % IV SOLN
Freq: Once | INTRAVENOUS | Status: AC
  Administered 2015-09-27: 09:00:00 via INTRAVENOUS

## 2015-09-27 MED ORDER — DIPHENHYDRAMINE HCL 25 MG PO CAPS
50.0000 mg | ORAL_CAPSULE | Freq: Once | ORAL | Status: AC
  Administered 2015-09-27: 50 mg via ORAL
  Filled 2015-09-27: qty 2

## 2015-09-27 MED ORDER — ACETAMINOPHEN 325 MG PO TABS
650.0000 mg | ORAL_TABLET | Freq: Four times a day (QID) | ORAL | Status: AC | PRN
  Administered 2015-09-27: 650 mg via ORAL
  Filled 2015-09-27: qty 2

## 2015-09-27 NOTE — Progress Notes (Signed)
Results for KUNAAL, MALAVE (MRN [id number]) as of 09/27/2015 13:35  Ref. Range 09/27/2015 13:04  Hemoglobin Latest Ref Range: 13.0-17.0 g/dL 9.3 (L)  HCT Latest Ref Range: 39.0-52.0 % 26.7 (L)  Post 2 units pRBCs

## 2015-09-28 LAB — TYPE AND SCREEN
ABO/RH(D): A POS
Antibody Screen: NEGATIVE
UNIT DIVISION: 0
Unit division: 0

## 2015-09-29 ENCOUNTER — Other Ambulatory Visit (HOSPITAL_COMMUNITY)
Admission: RE | Admit: 2015-09-29 | Discharge: 2015-09-29 | Disposition: A | Discharge status: Home / Self Care | Payer: Medicare Other | Source: Other Acute Inpatient Hospital | Attending: Internal Medicine | Admitting: Internal Medicine

## 2015-09-29 DIAGNOSIS — Z5181 Encounter for therapeutic drug level monitoring: Secondary | ICD-10-CM | POA: Insufficient documentation

## 2015-09-29 DIAGNOSIS — R7881 Bacteremia: Secondary | ICD-10-CM | POA: Diagnosis not present

## 2015-09-29 DIAGNOSIS — K3184 Gastroparesis: Secondary | ICD-10-CM | POA: Diagnosis not present

## 2015-09-29 LAB — CBC WITH DIFFERENTIAL/PLATELET
BASOS ABS: 0 10*3/uL (ref 0.0–0.1)
Basophils Relative: 0 %
EOS ABS: 0.2 10*3/uL (ref 0.0–0.7)
EOS PCT: 4 %
HCT: 28.2 % — ABNORMAL LOW (ref 39.0–52.0)
HEMOGLOBIN: 9.6 g/dL — AB (ref 13.0–17.0)
LYMPHS PCT: 11 %
Lymphs Abs: 0.6 10*3/uL — ABNORMAL LOW (ref 0.7–4.0)
MCH: 31 pg (ref 26.0–34.0)
MCHC: 34 g/dL (ref 30.0–36.0)
MCV: 91 fL (ref 78.0–100.0)
Monocytes Absolute: 0.6 10*3/uL (ref 0.1–1.0)
Monocytes Relative: 10 %
NEUTROS PCT: 75 %
Neutro Abs: 4.2 10*3/uL (ref 1.7–7.7)
PLATELETS: 51 10*3/uL — AB (ref 150–400)
RBC: 3.1 MIL/uL — AB (ref 4.22–5.81)
RDW: 17.9 % — ABNORMAL HIGH (ref 11.5–15.5)
WBC: 5.6 10*3/uL (ref 4.0–10.5)

## 2015-09-29 LAB — COMPREHENSIVE METABOLIC PANEL
ALBUMIN: 2.4 g/dL — AB (ref 3.5–5.0)
ALT: 23 U/L (ref 17–63)
ANION GAP: 6 (ref 5–15)
AST: 32 U/L (ref 15–41)
Alkaline Phosphatase: 175 U/L — ABNORMAL HIGH (ref 38–126)
BILIRUBIN TOTAL: 0.9 mg/dL (ref 0.3–1.2)
BUN: 59 mg/dL — AB (ref 6–20)
CO2: 22 mmol/L (ref 22–32)
Calcium: 7.7 mg/dL — ABNORMAL LOW (ref 8.9–10.3)
Chloride: 103 mmol/L (ref 101–111)
Creatinine, Ser: 1.19 mg/dL (ref 0.61–1.24)
Glucose, Bld: 104 mg/dL — ABNORMAL HIGH (ref 65–99)
POTASSIUM: 4.1 mmol/L (ref 3.5–5.1)
SODIUM: 131 mmol/L — AB (ref 135–145)
TOTAL PROTEIN: 5.3 g/dL — AB (ref 6.5–8.1)

## 2015-09-29 LAB — MAGNESIUM: MAGNESIUM: 2.7 mg/dL — AB (ref 1.7–2.4)

## 2015-09-29 LAB — PROTIME-INR
INR: 1.27 (ref 0.00–1.49)
Prothrombin Time: 16.1 seconds — ABNORMAL HIGH (ref 11.6–15.2)

## 2015-09-29 LAB — PHOSPHORUS: Phosphorus: 4.8 mg/dL — ABNORMAL HIGH (ref 2.5–4.6)

## 2015-09-30 ENCOUNTER — Ambulatory Visit (INDEPENDENT_AMBULATORY_CARE_PROVIDER_SITE_OTHER): Payer: Self-pay | Admitting: Internal Medicine

## 2015-09-30 ENCOUNTER — Encounter (INDEPENDENT_AMBULATORY_CARE_PROVIDER_SITE_OTHER): Payer: Self-pay

## 2015-10-01 ENCOUNTER — Telehealth (INDEPENDENT_AMBULATORY_CARE_PROVIDER_SITE_OTHER): Payer: Self-pay | Admitting: *Deleted

## 2015-10-01 DIAGNOSIS — K7689 Other specified diseases of liver: Secondary | ICD-10-CM | POA: Diagnosis not present

## 2015-10-01 DIAGNOSIS — K769 Liver disease, unspecified: Secondary | ICD-10-CM | POA: Diagnosis not present

## 2015-10-01 DIAGNOSIS — R7881 Bacteremia: Secondary | ICD-10-CM | POA: Diagnosis not present

## 2015-10-01 DIAGNOSIS — Z452 Encounter for adjustment and management of vascular access device: Secondary | ICD-10-CM | POA: Diagnosis not present

## 2015-10-01 DIAGNOSIS — Z5181 Encounter for therapeutic drug level monitoring: Secondary | ICD-10-CM | POA: Diagnosis not present

## 2015-10-01 DIAGNOSIS — J9600 Acute respiratory failure, unspecified whether with hypoxia or hypercapnia: Secondary | ICD-10-CM | POA: Diagnosis not present

## 2015-10-01 DIAGNOSIS — E43 Unspecified severe protein-calorie malnutrition: Secondary | ICD-10-CM | POA: Diagnosis not present

## 2015-10-01 DIAGNOSIS — K760 Fatty (change of) liver, not elsewhere classified: Secondary | ICD-10-CM | POA: Diagnosis not present

## 2015-10-01 DIAGNOSIS — A310 Pulmonary mycobacterial infection: Secondary | ICD-10-CM | POA: Diagnosis not present

## 2015-10-01 DIAGNOSIS — K3184 Gastroparesis: Secondary | ICD-10-CM | POA: Diagnosis not present

## 2015-10-01 DIAGNOSIS — K219 Gastro-esophageal reflux disease without esophagitis: Secondary | ICD-10-CM | POA: Diagnosis not present

## 2015-10-01 DIAGNOSIS — Z4801 Encounter for change or removal of surgical wound dressing: Secondary | ICD-10-CM | POA: Diagnosis not present

## 2015-10-01 DIAGNOSIS — B379 Candidiasis, unspecified: Secondary | ICD-10-CM | POA: Diagnosis not present

## 2015-10-01 DIAGNOSIS — K50813 Crohn's disease of both small and large intestine with fistula: Secondary | ICD-10-CM | POA: Diagnosis not present

## 2015-10-01 DIAGNOSIS — K50912 Crohn's disease, unspecified, with intestinal obstruction: Secondary | ICD-10-CM | POA: Diagnosis not present

## 2015-10-01 DIAGNOSIS — K269 Duodenal ulcer, unspecified as acute or chronic, without hemorrhage or perforation: Secondary | ICD-10-CM | POA: Diagnosis not present

## 2015-10-01 DIAGNOSIS — K739 Chronic hepatitis, unspecified: Secondary | ICD-10-CM | POA: Diagnosis not present

## 2015-10-01 DIAGNOSIS — F1120 Opioid dependence, uncomplicated: Secondary | ICD-10-CM | POA: Diagnosis not present

## 2015-10-01 DIAGNOSIS — I10 Essential (primary) hypertension: Secondary | ICD-10-CM | POA: Diagnosis not present

## 2015-10-01 DIAGNOSIS — K7460 Unspecified cirrhosis of liver: Secondary | ICD-10-CM | POA: Diagnosis not present

## 2015-10-01 NOTE — Telephone Encounter (Signed)
[name] called. She states that [facility] pulled tube and the hole has closed up. Patient is saying that he is now have N&V everyday. He told the nurse that he was told that if the tube needed replacement , that he would need a Mickey Tube. He also stated that this may need to be done at [facility]. Nurse reports that the patient was in much better spirits.

## 2015-10-01 NOTE — Telephone Encounter (Signed)
Talked with patient. Red rubber catheter or Foley's catheter needs to be inserted so that stoma would not close. Will arrange for PEG placement through existing fistula.

## 2015-10-02 ENCOUNTER — Other Ambulatory Visit (INDEPENDENT_AMBULATORY_CARE_PROVIDER_SITE_OTHER): Payer: Self-pay | Admitting: *Deleted

## 2015-10-02 DIAGNOSIS — R627 Adult failure to thrive: Secondary | ICD-10-CM

## 2015-10-03 ENCOUNTER — Encounter (HOSPITAL_COMMUNITY)
Admission: RE | Admit: 2015-10-03 | Discharge: 2015-10-03 | Disposition: A | Discharge status: Home / Self Care | Payer: Medicare Other | Source: Ambulatory Visit | Attending: Internal Medicine | Admitting: Internal Medicine

## 2015-10-03 ENCOUNTER — Encounter (HOSPITAL_COMMUNITY): Payer: Self-pay

## 2015-10-04 ENCOUNTER — Encounter (HOSPITAL_COMMUNITY)
Admission: RE | Disposition: A | Discharge status: Home / Self Care | Payer: Self-pay | Source: Ambulatory Visit | Attending: Internal Medicine

## 2015-10-04 ENCOUNTER — Ambulatory Visit (HOSPITAL_COMMUNITY)
Admission: RE | Admit: 2015-10-04 | Discharge: 2015-10-04 | Disposition: A | Discharge status: Home / Self Care | Payer: Medicare Other | Source: Ambulatory Visit | Attending: Internal Medicine | Admitting: Internal Medicine

## 2015-10-04 ENCOUNTER — Ambulatory Visit (HOSPITAL_COMMUNITY): Payer: Medicare Other | Admitting: Anesthesiology

## 2015-10-04 ENCOUNTER — Ambulatory Visit (HOSPITAL_COMMUNITY): Payer: Medicare Other

## 2015-10-04 ENCOUNTER — Encounter (HOSPITAL_COMMUNITY): Payer: Self-pay | Admitting: *Deleted

## 2015-10-04 DIAGNOSIS — J9600 Acute respiratory failure, unspecified whether with hypoxia or hypercapnia: Secondary | ICD-10-CM | POA: Diagnosis not present

## 2015-10-04 DIAGNOSIS — F329 Major depressive disorder, single episode, unspecified: Secondary | ICD-10-CM | POA: Diagnosis not present

## 2015-10-04 DIAGNOSIS — K7460 Unspecified cirrhosis of liver: Secondary | ICD-10-CM | POA: Insufficient documentation

## 2015-10-04 DIAGNOSIS — T17908A Unspecified foreign body in respiratory tract, part unspecified causing other injury, initial encounter: Secondary | ICD-10-CM

## 2015-10-04 DIAGNOSIS — K269 Duodenal ulcer, unspecified as acute or chronic, without hemorrhage or perforation: Secondary | ICD-10-CM | POA: Diagnosis not present

## 2015-10-04 DIAGNOSIS — Z9049 Acquired absence of other specified parts of digestive tract: Secondary | ICD-10-CM | POA: Insufficient documentation

## 2015-10-04 DIAGNOSIS — I10 Essential (primary) hypertension: Secondary | ICD-10-CM | POA: Insufficient documentation

## 2015-10-04 DIAGNOSIS — R627 Adult failure to thrive: Secondary | ICD-10-CM

## 2015-10-04 DIAGNOSIS — K219 Gastro-esophageal reflux disease without esophagitis: Secondary | ICD-10-CM | POA: Diagnosis not present

## 2015-10-04 DIAGNOSIS — Z431 Encounter for attention to gastrostomy: Secondary | ICD-10-CM | POA: Diagnosis not present

## 2015-10-04 DIAGNOSIS — R188 Other ascites: Secondary | ICD-10-CM | POA: Diagnosis not present

## 2015-10-04 DIAGNOSIS — K9423 Gastrostomy malfunction: Secondary | ICD-10-CM | POA: Diagnosis not present

## 2015-10-04 DIAGNOSIS — Z885 Allergy status to narcotic agent status: Secondary | ICD-10-CM | POA: Insufficient documentation

## 2015-10-04 DIAGNOSIS — Z884 Allergy status to anesthetic agent status: Secondary | ICD-10-CM | POA: Insufficient documentation

## 2015-10-04 DIAGNOSIS — I8500 Esophageal varices without bleeding: Secondary | ICD-10-CM | POA: Diagnosis not present

## 2015-10-04 DIAGNOSIS — K769 Liver disease, unspecified: Secondary | ICD-10-CM | POA: Diagnosis not present

## 2015-10-04 DIAGNOSIS — Z888 Allergy status to other drugs, medicaments and biological substances status: Secondary | ICD-10-CM | POA: Diagnosis not present

## 2015-10-04 DIAGNOSIS — K50819 Crohn's disease of both small and large intestine with unspecified complications: Secondary | ICD-10-CM

## 2015-10-04 DIAGNOSIS — K2210 Ulcer of esophagus without bleeding: Secondary | ICD-10-CM | POA: Diagnosis not present

## 2015-10-04 DIAGNOSIS — Z79899 Other long term (current) drug therapy: Secondary | ICD-10-CM | POA: Insufficient documentation

## 2015-10-04 DIAGNOSIS — K50813 Crohn's disease of both small and large intestine with fistula: Secondary | ICD-10-CM | POA: Diagnosis not present

## 2015-10-04 DIAGNOSIS — R918 Other nonspecific abnormal finding of lung field: Secondary | ICD-10-CM | POA: Diagnosis not present

## 2015-10-04 DIAGNOSIS — T17998A Other foreign object in respiratory tract, part unspecified causing other injury, initial encounter: Secondary | ICD-10-CM | POA: Diagnosis not present

## 2015-10-04 DIAGNOSIS — Z87891 Personal history of nicotine dependence: Secondary | ICD-10-CM | POA: Diagnosis not present

## 2015-10-04 DIAGNOSIS — K5090 Crohn's disease, unspecified, without complications: Secondary | ICD-10-CM | POA: Insufficient documentation

## 2015-10-04 DIAGNOSIS — Z87442 Personal history of urinary calculi: Secondary | ICD-10-CM | POA: Insufficient documentation

## 2015-10-04 DIAGNOSIS — F419 Anxiety disorder, unspecified: Secondary | ICD-10-CM | POA: Diagnosis not present

## 2015-10-04 HISTORY — PX: PEG PLACEMENT: SHX5437

## 2015-10-04 HISTORY — PX: ESOPHAGOGASTRODUODENOSCOPY (EGD) WITH PROPOFOL: SHX5813

## 2015-10-04 SURGERY — ESOPHAGOGASTRODUODENOSCOPY (EGD) WITH PROPOFOL
Anesthesia: Monitor Anesthesia Care

## 2015-10-04 MED ORDER — PIPERACILLIN-TAZOBACTAM 3.375 G IVPB
3.3750 g | Freq: Once | INTRAVENOUS | Status: AC
  Administered 2015-10-04: 3.375 g via INTRAVENOUS
  Filled 2015-10-04: qty 50

## 2015-10-04 MED ORDER — MIDAZOLAM HCL 2 MG/2ML IJ SOLN
1.0000 mg | INTRAMUSCULAR | Status: DC | PRN
  Administered 2015-10-04 (×2): 2 mg via INTRAVENOUS

## 2015-10-04 MED ORDER — PROPOFOL 10 MG/ML IV BOLUS
INTRAVENOUS | Status: DC | PRN
  Administered 2015-10-04 (×3): 15 mg via INTRAVENOUS

## 2015-10-04 MED ORDER — BUTAMBEN-TETRACAINE-BENZOCAINE 2-2-14 % EX AERO
1.0000 | INHALATION_SPRAY | Freq: Once | CUTANEOUS | Status: AC
  Administered 2015-10-04: 1 via TOPICAL
  Filled 2015-10-04: qty 20

## 2015-10-04 MED ORDER — AMPICILLIN-SULBACTAM SODIUM 3 (2-1) G IJ SOLR
3.0000 g | Freq: Once | INTRAMUSCULAR | Status: DC
  Filled 2015-10-04: qty 3

## 2015-10-04 MED ORDER — PROPOFOL 500 MG/50ML IV EMUL
INTRAVENOUS | Status: DC | PRN
  Administered 2015-10-04: 50 ug/kg/min via INTRAVENOUS
  Administered 2015-10-04: 125 ug/kg/min via INTRAVENOUS

## 2015-10-04 MED ORDER — MIDAZOLAM HCL 2 MG/2ML IJ SOLN
INTRAMUSCULAR | Status: AC
  Filled 2015-10-04: qty 2

## 2015-10-04 MED ORDER — LACTATED RINGERS IV SOLN
INTRAVENOUS | Status: DC
  Administered 2015-10-04: 08:00:00 via INTRAVENOUS

## 2015-10-04 MED ORDER — PROPOFOL 10 MG/ML IV BOLUS
INTRAVENOUS | Status: AC
  Filled 2015-10-04: qty 20

## 2015-10-04 MED ORDER — STERILE WATER FOR IRRIGATION IR SOLN
Status: DC | PRN
  Administered 2015-10-04: 1000 mL

## 2015-10-04 MED ORDER — PIPERACILLIN-TAZOBACTAM 3.375 G IVPB: 3.3750 g | Freq: Three times a day (TID) | INTRAVENOUS | Status: DC

## 2015-10-04 MED ORDER — LIDOCAINE HCL (PF) 1 % IJ SOLN
INTRAMUSCULAR | Status: AC
  Filled 2015-10-04: qty 4

## 2015-10-04 MED ORDER — PIPERACILLIN-TAZOBACTAM 3.375 G IVPB
3.3750 g | Freq: Three times a day (TID) | INTRAVENOUS | Status: DC
  Filled 2015-10-04: qty 50

## 2015-10-04 MED ORDER — AMPICILLIN-SULBACTAM SODIUM 3 (2-1) G IJ SOLR
3.0000 g | INTRAMUSCULAR | Status: DC | PRN
  Administered 2015-10-04: 3 g via INTRAVENOUS

## 2015-10-04 MED ORDER — ONDANSETRON HCL 4 MG/2ML IJ SOLN: 4.0000 mg | Freq: Once | INTRAMUSCULAR | Status: DC | PRN

## 2015-10-04 MED ORDER — LIDOCAINE HCL (CARDIAC) 10 MG/ML IV SOLN
INTRAVENOUS | Status: DC | PRN
  Administered 2015-10-04: 50 mg via INTRAVENOUS

## 2015-10-04 SURGICAL SUPPLY — 25 items
BAG URINE DRAINAGE (UROLOGICAL SUPPLIES) ×3 IMPLANT
BLADE SURG SZ10 CARB STEEL (BLADE) ×3 IMPLANT
BLOCK BITE 60FR ADLT L/F BLUE (MISCELLANEOUS) ×3 IMPLANT
BUTTON ONE STEP K24X2.4PEG KIT (KITS) ×3 IMPLANT
BUTTON ONE STEP K24X3.4PEG KIT (KITS) IMPLANT
ELECT REM PT RETURN 9FT ADLT (ELECTROSURGICAL)
ELECTRODE REM PT RTRN 9FT ADLT (ELECTROSURGICAL) IMPLANT
FLOOR PAD 36X40 (MISCELLANEOUS) ×3
FORCEP COLD BIOPSY (CUTTING FORCEPS) IMPLANT
FORCEPS BIOP RAD 4 LRG CAP 4 (CUTTING FORCEPS) IMPLANT
FORMALIN 10 PREFIL 20ML (MISCELLANEOUS) IMPLANT
KIT ENDO PROCEDURE PEN (KITS) ×3 IMPLANT
MANIFOLD NEPTUNE II (INSTRUMENTS) ×3 IMPLANT
NEEDLE SCLEROTHERAPY 25GX240 (NEEDLE) IMPLANT
PAD FLOOR 36X40 (MISCELLANEOUS) ×1 IMPLANT
PROBE APC STR FIRE (PROBE) IMPLANT
PROBE INJECTION GOLD (MISCELLANEOUS)
PROBE INJECTION GOLD 7FR (MISCELLANEOUS) IMPLANT
SNARE ROTATE MED OVAL 20MM (MISCELLANEOUS) IMPLANT
SNARE SHORT THROW 13M SML OVAL (MISCELLANEOUS) ×6 IMPLANT
SPONGE DRAIN TRACH 4X4 STRL 2S (GAUZE/BANDAGES/DRESSINGS) ×3 IMPLANT
SYR INFLATION 60ML (SYRINGE) IMPLANT
TAPE CLOTH SURG 4X10 WHT LF (GAUZE/BANDAGES/DRESSINGS) ×3 IMPLANT
TUBING IRRIGATION ENDOGATOR (MISCELLANEOUS) ×3 IMPLANT
WATER STERILE IRR 1000ML POUR (IV SOLUTION) ×3 IMPLANT

## 2015-10-04 NOTE — Anesthesia Procedure Notes (Signed)
Procedure Name: MAC Date/Time: 10/04/2015 9:31 AM Performed by: Franco Nones Pre-anesthesia Checklist: Patient identified, Emergency Drugs available, Suction available, Timeout performed and Patient being monitored Patient Re-evaluated:Patient Re-evaluated prior to inductionOxygen Delivery Method: Non-rebreather mask

## 2015-10-04 NOTE — Anesthesia Postprocedure Evaluation (Signed)
  Anesthesia Post-op Note  Patient: David [name]  Procedure(s) Performed: Procedure(s) with comments: ESOPHAGOGASTRODUODENOSCOPY (EGD) WITH PROPOFOL (N/A) - 855 PERCUTANEOUS ENDOSCOPIC GASTROSTOMY (PEG) PLACEMENT (N/A)  Patient Location: PACU  Anesthesia Type:MAC  Level of Consciousness: awake, alert  and patient cooperative  Airway and Oxygen Therapy: Patient Spontanous Breathing  Post-op Pain: 5 /10  Post-op Assessment: Post-op Vital signs reviewed, Patient's Cardiovascular Status Stable and Respiratory Function Stable              Post-op Vital Signs: Reviewed and stable  Last Vitals:  Filed Vitals:   10/04/15 1115  BP: 103/65  Pulse: 95  Temp:   Resp: 23    Complications: No apparent anesthesia complications. Chest xray with no acute findings.

## 2015-10-04 NOTE — OR Nursing (Signed)
Foley cath intact at stoma site. To keep stoma open,  Draining around site and drressing.

## 2015-10-04 NOTE — Anesthesia Preprocedure Evaluation (Signed)
Anesthesia Evaluation  Patient identified by MRN, date of birth, ID band Patient awake    Reviewed: Allergy & Precautions, NPO status , Patient's Chart, lab work & pertinent test results, reviewed documented beta blocker date and time , Unable to perform ROS - Chart review only  Airway Mallampati: II  TM Distance: >3 FB     Dental  (+) Teeth Intact   Pulmonary shortness of breath and with exertion, pneumonia, resolved, former smoker,    Pulmonary exam normal        Cardiovascular hypertension, Pt. on medications Normal cardiovascular exam     Neuro/Psych PSYCHIATRIC DISORDERS Anxiety Depression    GI/Hepatic PUD, GERD  Medicated and Controlled,(+) Cirrhosis   Esophageal Varices and ascites    ,   Endo/Other    Renal/GU Renal Insufficiency and ARFRenal disease     Musculoskeletal   Abdominal Normal abdominal exam  (+)   Peds  Hematology  (+) anemia ,   Anesthesia Other Findings   Reproductive/Obstetrics                             Anesthesia Physical Anesthesia Plan  ASA: IV  Anesthesia Plan: MAC   Post-op Pain Management:    Induction:   Airway Management Planned: Nasal Cannula  Additional Equipment:   Intra-op Plan:   Post-operative Plan:   Informed Consent: I have reviewed the patients History and Physical, chart, labs and discussed the procedure including the risks, benefits and alternatives for the proposed anesthesia with the patient or authorized representative who has indicated his/her understanding and acceptance.   Dental advisory given  Plan Discussed with: CRNA  Anesthesia Plan Comments:         Anesthesia Quick Evaluation

## 2015-10-04 NOTE — Op Note (Signed)
EGD PROCEDURE REPORT  PATIENT:  David [name]  MR#:  [id number] Birthdate:  1976/04/01, 39 y.o., male Endoscopist:  Dr. Malissa Hippo, MD  Procedure Date: 10/04/2015  Procedure:   EGD with PEG placement via mature gastric fistula.  Indications: Patient is 39 year old Caucasian male with multiple medical problems including Crohn's disease advanced cirrhosis was at gastrostomy tube primarily for decompression. His stoma became large and too was removed while he was at [facility]. Stoma has partially healed and patient has requested that but in all gastrostomy tube be placed as was the recommendation from [facility].            Informed Consent:  The risks, benefits, alternatives & imponderables which include, but are not limited to, bleeding, infection, perforation, drug reaction and potential missed lesion have been reviewed.  The potential for biopsy, lesion removal, esophageal dilation, etc. have also been discussed.  Questions have been answered.  All parties agreeable.  Please see history & physical in medical record for more information.  Medications:  Monitored anesthesia care. Cetacaine spray topically for oropharyngeal anesthesia  Description of procedure:  The endoscope was introduced through the mouth and advanced to the second portion of the duodenum without difficulty or limitations. The mucosal surfaces were surveyed very carefully during advancement of the scope and upon withdrawal.  Findings:  Esophagus: Mucosa of proximal half was normal. Extensive ulcers noted to distal half of the mucosa. Stomach:  Large stomach full of food debris.  Gastrojejunal anastomosis could not be seen because of food debris. Gastrostomy tube was located. Examination of the stomach was incomplete because of gastric contents. Pyloric channel appeared to be unremarkable. Duodenum:  Not examined.  Therapeutic/Diagnostic Maneuvers Performed:  Gastrostomy tube was removed after deflating balloon. Cannula was  introduced through the fistula without the needle and length of gastrostomy length was using percutaneous stoma measuring device. Gastrostomy tube with 3.4 mm length was selected. Guidewire was advanced through this cannula and called with snare already in place. Guidewire was removed along with the scope. Gastrostomy tube was advanced over the guidewire. Difficulty encountered in pulling the string in order to release the gastrostomy tube. Our sheet was then cut with scalpel blade and gastrostomy tube was released. During this process patient vomited large amount of gastric contents. He had good cough reflex. Suction was used to vigorously to prevent aspiration. Once patient settle endoscope was passed again and G2 was examined and appeared to be in satisfactory position.   Complications:  Patient aspirated during the procedure and was immediately suctioned to prevent aspiration.  EBL: None  Impression: 52 French gastrostomy tube was removed. 20 [place] button hole gastrostomy tube with 34 mm length placed across gastrostomy. Large stomach containing gastric contents. Extensive ulceration to distal esophagus.  Recommendations:  Unasyn 3 g given IV. Will get chest x-ray in PACU and determine disposition.  Aliesha Dolata U  10/04/2015  10:42 AM  CC: Dr. Lubertha South, MD & Dr. Bonnetta Barry ref. provider found

## 2015-10-04 NOTE — Discharge Instructions (Signed)
Patient to get Zosyn 3.375 g IV every 8 hours 5 days. Gastrostomy tube to be use for gravity drainage. Therefore liquids by mouth but no solid food. Call if you have any questions regarding gastrostomy tube( pager [phone])   Dr. Elvera Lennox

## 2015-10-04 NOTE — H&P (Signed)
David [name] is an 39 y.o. male.   Chief Complaint: Patient is here for PEG placement wire existing fistula. 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 He was hospitalized at [facility] for sepsis and septic arthritis. He also had problems with his gastrostomy tube. Since fistula became large the 2 was removed and colostomy bag was placed. Patient called last week stating that the fistula was trying to close. He requested placement of buttonhole gastrostomy tube as recommended by his physicians at [facility]. He remains in chronic pain. He is on TPN and he's also on PCA pump. According to his mother he is able to ambulate in the house and use the bathroom.  Past Medical History  Diagnosis Date  . 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 Crohn's disease with fistula (HCC) 05/04/2011    both large and small intestinges/notes 11/15/2012  . Hepatomegaly 05/04/2011  . Fatty liver 05/04/2011    "stage III fatty liver fibrosis" (11/15/2012)  . GERD (gastroesophageal reflux disease)   . Chronic liver disease     /notes 11/15/2012  . Pericarditis     [name] 11/15/2012  . Hypertension   . Pneumonia 1977  . Shortness of breath     "all the time right now" (11/15/2012)  . History of blood transfusion 2004  . History of stomach ulcers   . Duodenal ulcer   . Depression   . Kidney stones     bilaterally/notes 11/15/2012  . Hepatic fibrosis (HCC)     [name] 11/15/2012  . Anemia   . Pericardial effusion 10/29/2012    moderate to large/notes 11/15/2012  . Anxiety   . Hepatitis   . Crohn's disease (HCC)   . ED (erectile dysfunction)   . Cirrhosis (HCC)     secondary to drug effect, +/- fatty liver    Past Surgical History  Procedure Laterality Date  . Anal examination under anesthesia  02/11/2011    [name]  . Treatment  fistula anal  07/03/11    This was a second surgery to repair Anal fistula  . Gastrojejunostomy  2004    "had hole cut in small intestines during endoscopy; had to have OR & leave me open 3 months" (11/15/2012)  . Cholecystectomy  ~ 2003  . Appendectomy  ~ 2003  . Video assisted thoracoscopy  11/17/2012    Procedure: VIDEO ASSISTED THORACOSCOPY;  Surgeon: Loreli Slot, MD;  Location[facility] OR;  Service: Thoracic;  Laterality: Left;  drainage of left pleural effusion  . Pericardial window  11/17/2012    Procedure: PERICARDIAL WINDOW;  Surgeon: Loreli Slot, MD;  Location: [facility] OR;  Service: Thoracic;  Laterality: N/A;  . Esophagogastroduodenoscopy  01/06/2013    Procedure: ESOPHAGOGASTRODUODENOSCOPY (EGD);  Surgeon: Malissa Hippo, MD;  Location: [facility] ENDO SUITE;  Service: Endoscopy;  Laterality: N/A;  11:15  . Esophagogastroduodenoscopy N/A 03/09/2014    Dr. Karilyn Cota: erosive/ulcerative reflux esophagiits, portal gastropathy, moderate amount of bile and food debris in stomach precluding visualization of GJ anastomosis  . Gastrostomy tube placement  11/19/14    [facility]: 76 F APDL catheter. Checked again 12/23 via fluoroscopy and in suitable position  . Colonoscopy  Aug 2015    Dr. Edyth Gunnels at [facility]: congested mucosa in entire colon, solitary ulcer distal ileum, stricture in ileum 5 cm from IC valve s/p dilation.  Surveillance in 1 year  . Exploratory laparotomy  Nov 05, 2014    [facility], Dr. Byrd Hesselbach. Several hour operation with unsuccessful lysis of adhesions due to frozen abdomen  . Picc line    . Tee without cardioversion N/A 01/28/2015    Procedure: TRANSESOPHAGEAL ECHOCARDIOGRAM (TEE);  Surgeon: Thurmon Fair, MD;  Location: [facility] ENDOSCOPY;  Service: Cardiovascular;  Laterality: N/A;    Family History  Problem Relation Age of Onset  . Diabetes Mother   . Diabetes Brother   . Healthy Daughter   . Healthy Son   . Colon cancer Neg Hx    Social History:  reports that he quit smoking  about 2 years ago. His smoking use included Cigarettes. He started smoking about 2 years ago. He has a 12 pack-year smoking history. His smokeless tobacco use includes Snuff. He reports that he does not drink alcohol or use illicit drugs.  Allergies:  Allergies  Allergen Reactions  . Remicade [Infliximab]     Dr Karilyn Cota states previous respiratory arrest not related to remicade. Dr Karilyn Cota states remicade not a drug related allergy. 07-07-2013 at 1025 rapid response called to PACU- Patient difficulty breathing after infusion of Remicade infusing. Do NOT Give Remicade!  . Fentanyl And Related Itching    Swelling, Redness Tolerated fentanyl 01/2015  . Alfentanil Itching    Swelling, Redness  . Wellbutrin [Bupropion] Nausea And Vomiting  . Morphine And Related Hives    Medications Prior to Admission  Medication Sig Dispense Refill  . calcium-vitamin D (OSCAL 500/200 D-3) 500-200 MG-UNIT per tablet Take 1 tablet by mouth 2 (two) times daily.    . ciprofloxacin (CIPRO) 400 MG/200ML SOLN Inject 400 mg into the vein every 8 (eight) hours.    . cloNIDine (CATAPRES) 0.1 MG tablet Take 0.1 mg by mouth daily.     . ferrous gluconate (FERGON) 324 MG tablet Take 324 mg by mouth 3 (three) times daily.    . fish oil-omega-3 fatty acids 1000 MG capsule Take 1 g by mouth 3 (three) times daily.    . Gabapentin 300 MG/6ML SOLN Take 12 mLs by mouth 3 (three) times daily.    . pantoprazole (PROTONIX) 40 MG tablet Take 1 tablet (40 mg total) by mouth 2 (two) times daily. 60 tablet 5  . PAXIL 10 MG/5ML suspension Take 10 mg by mouth daily.     . pentamidine (PENTAM) 300 MG inhalation solution Inhale 300 mg into the lungs every 28 (twenty-eight) days.    . potassium chloride 20 MEQ/15ML (10%) SOLN Take 30 mLs (40 mEq total) by mouth 3 (three) times daily. 900 mL 0  . prednisoLONE (PRELONE) 15 MG/5ML SOLN Take 3.3 mLs by mouth daily.    . Probiotic Product (ALIGN) 4 MG CAPS Take 4 mg by mouth daily.     .  propranolol (INDERAL) 10 MG tablet Take 10 mg by mouth 2 (two) times daily.     Marland Kitchen spironolactone (ALDACTONE) 25 MG tablet Take 1 tablet (25 mg total) by mouth 2 (two) times daily. 60 tablet 1  . dextrose 5 % solution as needed.     . dicyclomine (BENTYL) 10 MG capsule Take 1 capsule (10 mg total) by mouth 3 (three) times daily as needed (stomach cramps). 90 capsule 5  . furosemide (LASIX) 40 MG tablet Take 1 tablet (40 mg total) by mouth daily as needed. (Patient taking differently: Take 40 mg by mouth daily as needed for fluid. ) 30 tablet 2  . metolazone (ZAROXOLYN)  5 MG tablet Take 1 tablet (5 mg total) by mouth 2 (two) times daily. (Patient taking differently: Take 5 mg by mouth 2 (two) times daily as needed (fluid). ) 30 tablet 0  . nystatin cream (MYCOSTATIN) Apply 1 application topically 3 (three) times daily as needed for dry skin. Use as directed 30 g 3  . oxyCODONE (ROXICODONE INTENSOL) 20 MG/ML concentrated solution Take 2.5 mLs (50 mg total) by mouth 4 (four) times daily. (Patient not taking: Reported on 10/03/2015) 140 mL 0  . polyethylene glycol (MIRALAX / GLYCOLAX) packet Take 17 g by mouth daily as needed for mild constipation or moderate constipation.     . predniSONE (DELTASONE) 20 MG tablet Take 1 tablet (20 mg total) by mouth daily with breakfast. (Patient not taking: Reported on 10/03/2015) 60 tablet 2  . promethazine (PHENERGAN) 25 MG tablet Take 1 tablet (25 mg total) by mouth 2 (two) times daily as needed. nausea 30 tablet 1  . sodium chloride 0.9 % infusion as needed.     . sucralfate (CARAFATE) 1 G tablet Take 1 g by mouth 3 (three) times daily before meals.      No results found for this or any previous visit (from the past 48 hour(s)). No results found.  ROS  Blood pressure 105/66, pulse 94, temperature 98.7 F (37.1 C), temperature source Oral, resp. rate 17, height  (1.803 m), weight 160 lb (72.576 kg), SpO2 96 %. Physical Exam  Constitutional:   Well-developed cachectic Caucasian male who appears chronically ill.  HENT:  Oral mucosa is dry.  Eyes: Conjunctivae are normal. No scleral icterus.  Neck: No thyromegaly present.  Cardiovascular: Normal rate, regular rhythm and normal heart sounds.   No murmur heard. Respiratory: Effort normal and breath sounds normal.  GI:  Abdomen is full with prominent subcutaneous veins. G-tube is in place. It appears to be 59 [place] and there is reflux of gastric contents around the tube as well as through the tube. On palpation abdomen is tense and tender with enlarged tender liver.  Musculoskeletal: He exhibits edema (1+ pitting edema involving both legs.).  Lymphadenopathy:    He has no cervical adenopathy.  Neurological:  Patient is drowsy but easily arousable and able to questions.  Skin: Skin is warm and dry.     Assessment/Plan Malfunctioning gastrostomy. Esophagogastroduodenoscopy with PEG insertion wire existing fistula if feasible. I have reviewed the procedure with patient and his mother and they're agreeable. Patient will need monitored anesthesia care. He would not need antimicrobial prophylaxis as I do not intend to make another incision.  Saivion Goettel U 10/04/2015, 9:16 AM

## 2015-10-04 NOTE — Progress Notes (Signed)
Reported to karen gibson rn 

## 2015-10-04 NOTE — Transfer of Care (Signed)
Immediate Anesthesia Transfer of Care Note  Patient: David [name]  Procedure(s) Performed: Procedure(s) with comments: ESOPHAGOGASTRODUODENOSCOPY (EGD) WITH PROPOFOL (N/A) - 855 PERCUTANEOUS ENDOSCOPIC GASTROSTOMY (PEG) PLACEMENT (N/A)  Patient Location: PACU  Anesthesia Type:MAC  Level of Consciousness: awake and patient cooperative  Airway & Oxygen Therapy: Patient Spontanous Breathing and non-rebreather face mask  Post-op Assessment: Report given to RN, Post -op Vital signs reviewed and stable and Patient moving all extremities  Post vital signs: Reviewed and stable    Complications: respiratory complications Chest xray pending to rule out apsiration. Patient vomited large amount during procedure.

## 2015-10-04 NOTE — OR Nursing (Signed)
Double lumen port  To right chest,  LR infusing via  Port,  Second port  has dilaudid  Infusing continously and gives  Self bolus every  10 minutes.

## 2015-10-04 NOTE — Progress Notes (Signed)
Out to post op to receive antibiotic. And then be dicharged to home.

## 2015-10-05 DIAGNOSIS — R7881 Bacteremia: Secondary | ICD-10-CM | POA: Diagnosis not present

## 2015-10-05 DIAGNOSIS — K3184 Gastroparesis: Secondary | ICD-10-CM | POA: Diagnosis not present

## 2015-10-07 ENCOUNTER — Encounter (HOSPITAL_COMMUNITY): Payer: Self-pay | Admitting: Internal Medicine

## 2015-10-07 DIAGNOSIS — Z452 Encounter for adjustment and management of vascular access device: Secondary | ICD-10-CM | POA: Diagnosis not present

## 2015-10-07 DIAGNOSIS — K3184 Gastroparesis: Secondary | ICD-10-CM | POA: Diagnosis not present

## 2015-10-07 DIAGNOSIS — K50813 Crohn's disease of both small and large intestine with fistula: Secondary | ICD-10-CM | POA: Diagnosis not present

## 2015-10-07 DIAGNOSIS — E43 Unspecified severe protein-calorie malnutrition: Secondary | ICD-10-CM | POA: Diagnosis not present

## 2015-10-07 DIAGNOSIS — K769 Liver disease, unspecified: Secondary | ICD-10-CM | POA: Diagnosis not present

## 2015-10-07 DIAGNOSIS — I10 Essential (primary) hypertension: Secondary | ICD-10-CM | POA: Diagnosis not present

## 2015-10-07 DIAGNOSIS — R7881 Bacteremia: Secondary | ICD-10-CM | POA: Diagnosis not present

## 2015-10-09 DIAGNOSIS — K269 Duodenal ulcer, unspecified as acute or chronic, without hemorrhage or perforation: Secondary | ICD-10-CM | POA: Diagnosis not present

## 2015-10-09 DIAGNOSIS — J9600 Acute respiratory failure, unspecified whether with hypoxia or hypercapnia: Secondary | ICD-10-CM | POA: Diagnosis not present

## 2015-10-10 ENCOUNTER — Encounter (INDEPENDENT_AMBULATORY_CARE_PROVIDER_SITE_OTHER): Payer: Self-pay

## 2015-10-11 ENCOUNTER — Other Ambulatory Visit (HOSPITAL_COMMUNITY)
Admission: RE | Admit: 2015-10-11 | Discharge: 2015-10-11 | Disposition: A | Discharge status: Home / Self Care | Payer: Medicare Other | Source: Ambulatory Visit | Attending: Internal Medicine | Admitting: Internal Medicine

## 2015-10-11 DIAGNOSIS — A310 Pulmonary mycobacterial infection: Secondary | ICD-10-CM | POA: Diagnosis not present

## 2015-10-11 DIAGNOSIS — R7881 Bacteremia: Secondary | ICD-10-CM | POA: Diagnosis not present

## 2015-10-11 DIAGNOSIS — K3184 Gastroparesis: Secondary | ICD-10-CM | POA: Diagnosis not present

## 2015-10-11 LAB — BASIC METABOLIC PANEL
ANION GAP: 10 (ref 5–15)
BUN: 84 mg/dL — ABNORMAL HIGH (ref 6–20)
CHLORIDE: 97 mmol/L — AB (ref 101–111)
CO2: 28 mmol/L (ref 22–32)
CREATININE: 2.63 mg/dL — AB (ref 0.61–1.24)
Calcium: 7.2 mg/dL — ABNORMAL LOW (ref 8.9–10.3)
GFR calc non Af Amer: 29 mL/min — ABNORMAL LOW (ref >60)
GFR, EST AFRICAN AMERICAN: 34 mL/min — AB (ref >60)
Glucose, Bld: 100 mg/dL — ABNORMAL HIGH (ref 65–99)
Potassium: 3.4 mmol/L — ABNORMAL LOW (ref 3.5–5.1)
Sodium: 135 mmol/L (ref 135–145)

## 2015-10-12 DIAGNOSIS — I10 Essential (primary) hypertension: Secondary | ICD-10-CM | POA: Diagnosis not present

## 2015-10-12 DIAGNOSIS — K269 Duodenal ulcer, unspecified as acute or chronic, without hemorrhage or perforation: Secondary | ICD-10-CM | POA: Diagnosis not present

## 2015-10-12 DIAGNOSIS — K50813 Crohn's disease of both small and large intestine with fistula: Secondary | ICD-10-CM | POA: Diagnosis not present

## 2015-10-12 DIAGNOSIS — J9600 Acute respiratory failure, unspecified whether with hypoxia or hypercapnia: Secondary | ICD-10-CM | POA: Diagnosis not present

## 2015-10-12 DIAGNOSIS — K769 Liver disease, unspecified: Secondary | ICD-10-CM | POA: Diagnosis not present

## 2015-10-13 ENCOUNTER — Other Ambulatory Visit (HOSPITAL_COMMUNITY)
Admission: RE | Admit: 2015-10-13 | Discharge: 2015-10-13 | Disposition: A | Discharge status: Home / Self Care | Payer: Medicare Other | Source: Other Acute Inpatient Hospital | Attending: Internal Medicine | Admitting: Internal Medicine

## 2015-10-13 DIAGNOSIS — Z5181 Encounter for therapeutic drug level monitoring: Secondary | ICD-10-CM | POA: Insufficient documentation

## 2015-10-13 DIAGNOSIS — K769 Liver disease, unspecified: Secondary | ICD-10-CM | POA: Diagnosis not present

## 2015-10-13 DIAGNOSIS — K3184 Gastroparesis: Secondary | ICD-10-CM | POA: Diagnosis not present

## 2015-10-13 DIAGNOSIS — K269 Duodenal ulcer, unspecified as acute or chronic, without hemorrhage or perforation: Secondary | ICD-10-CM | POA: Diagnosis not present

## 2015-10-13 DIAGNOSIS — R7881 Bacteremia: Secondary | ICD-10-CM | POA: Diagnosis not present

## 2015-10-13 DIAGNOSIS — J9600 Acute respiratory failure, unspecified whether with hypoxia or hypercapnia: Secondary | ICD-10-CM | POA: Diagnosis not present

## 2015-10-13 DIAGNOSIS — I10 Essential (primary) hypertension: Secondary | ICD-10-CM | POA: Diagnosis not present

## 2015-10-13 DIAGNOSIS — K50813 Crohn's disease of both small and large intestine with fistula: Secondary | ICD-10-CM | POA: Diagnosis not present

## 2015-10-13 LAB — CBC WITH DIFFERENTIAL/PLATELET
BASOS PCT: 0 %
Basophils Absolute: 0 10*3/uL (ref 0.0–0.1)
Eosinophils Absolute: 0.3 10*3/uL (ref 0.0–0.7)
Eosinophils Relative: 3 %
HEMATOCRIT: 28.1 % — AB (ref 39.0–52.0)
HEMOGLOBIN: 9.1 g/dL — AB (ref 13.0–17.0)
LYMPHS ABS: 1.1 10*3/uL (ref 0.7–4.0)
LYMPHS PCT: 11 %
MCH: 29.8 pg (ref 26.0–34.0)
MCHC: 32.4 g/dL (ref 30.0–36.0)
MCV: 92.1 fL (ref 78.0–100.0)
MONOS PCT: 7 %
Monocytes Absolute: 0.7 10*3/uL (ref 0.1–1.0)
NEUTROS PCT: 79 %
Neutro Abs: 7.9 10*3/uL — ABNORMAL HIGH (ref 1.7–7.7)
Platelets: 69 10*3/uL — ABNORMAL LOW (ref 150–400)
RBC: 3.05 MIL/uL — AB (ref 4.22–5.81)
RDW: 16.9 % — ABNORMAL HIGH (ref 11.5–15.5)
WBC: 10.1 10*3/uL (ref 4.0–10.5)

## 2015-10-13 LAB — COMPREHENSIVE METABOLIC PANEL
ALBUMIN: 2 g/dL — AB (ref 3.5–5.0)
ALK PHOS: 176 U/L — AB (ref 38–126)
ALT: 16 U/L — AB (ref 17–63)
ANION GAP: 7 (ref 5–15)
AST: 31 U/L (ref 15–41)
BILIRUBIN TOTAL: 1.2 mg/dL (ref 0.3–1.2)
BUN: 75 mg/dL — ABNORMAL HIGH (ref 6–20)
CALCIUM: 7.4 mg/dL — AB (ref 8.9–10.3)
CO2: 35 mmol/L — ABNORMAL HIGH (ref 22–32)
CREATININE: 1.78 mg/dL — AB (ref 0.61–1.24)
Chloride: 94 mmol/L — ABNORMAL LOW (ref 101–111)
GFR calc Af Amer: 54 mL/min — ABNORMAL LOW (ref >60)
GFR calc non Af Amer: 47 mL/min — ABNORMAL LOW (ref >60)
GLUCOSE: 149 mg/dL — AB (ref 65–99)
Potassium: 2.9 mmol/L — ABNORMAL LOW (ref 3.5–5.1)
Sodium: 136 mmol/L (ref 135–145)
TOTAL PROTEIN: 6.1 g/dL — AB (ref 6.5–8.1)

## 2015-10-13 LAB — PROTIME-INR
INR: 1.39 (ref 0.00–1.49)
Prothrombin Time: 17.2 seconds — ABNORMAL HIGH (ref 11.6–15.2)

## 2015-10-13 LAB — PREALBUMIN: Prealbumin: 6.2 mg/dL — ABNORMAL LOW (ref 18–38)

## 2015-10-13 LAB — MAGNESIUM: MAGNESIUM: 2.2 mg/dL (ref 1.7–2.4)

## 2015-10-13 LAB — TRIGLYCERIDES: Triglycerides: 46 mg/dL (ref <150)

## 2015-10-13 LAB — PHOSPHORUS: Phosphorus: 4.1 mg/dL (ref 2.5–4.6)

## 2015-10-14 ENCOUNTER — Encounter (INDEPENDENT_AMBULATORY_CARE_PROVIDER_SITE_OTHER): Payer: Self-pay | Admitting: Internal Medicine

## 2015-10-14 ENCOUNTER — Ambulatory Visit (INDEPENDENT_AMBULATORY_CARE_PROVIDER_SITE_OTHER): Payer: Medicare Other | Admitting: Internal Medicine

## 2015-10-14 VITALS — BP 92/58 | HR 64 | Temp 101.0°F | Resp 16 | Ht 71.0 in | Wt 162.4 lb

## 2015-10-14 DIAGNOSIS — K7469 Other cirrhosis of liver: Secondary | ICD-10-CM

## 2015-10-14 DIAGNOSIS — K2100 Gastro-esophageal reflux disease with esophagitis, without bleeding: Secondary | ICD-10-CM

## 2015-10-14 DIAGNOSIS — E43 Unspecified severe protein-calorie malnutrition: Secondary | ICD-10-CM

## 2015-10-14 DIAGNOSIS — I10 Essential (primary) hypertension: Secondary | ICD-10-CM | POA: Diagnosis not present

## 2015-10-14 DIAGNOSIS — K50813 Crohn's disease of both small and large intestine with fistula: Secondary | ICD-10-CM | POA: Diagnosis not present

## 2015-10-14 DIAGNOSIS — R509 Fever, unspecified: Secondary | ICD-10-CM

## 2015-10-14 DIAGNOSIS — E876 Hypokalemia: Secondary | ICD-10-CM | POA: Diagnosis not present

## 2015-10-14 DIAGNOSIS — J9600 Acute respiratory failure, unspecified whether with hypoxia or hypercapnia: Secondary | ICD-10-CM | POA: Diagnosis not present

## 2015-10-14 DIAGNOSIS — K769 Liver disease, unspecified: Secondary | ICD-10-CM | POA: Diagnosis not present

## 2015-10-14 DIAGNOSIS — K50012 Crohn's disease of small intestine with intestinal obstruction: Secondary | ICD-10-CM

## 2015-10-14 DIAGNOSIS — K269 Duodenal ulcer, unspecified as acute or chronic, without hemorrhage or perforation: Secondary | ICD-10-CM | POA: Diagnosis not present

## 2015-10-14 NOTE — Progress Notes (Signed)
Presenting complaint;  Follow-up for multiple problems.  Subjective:  David [name] is 39 year old Caucasian male who is here for scheduled visit. He was last seen on 08/12/2015. 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 He vomited during the procedure but was vigorously suctioned out and he was noted to have good cough reflex. Patient declined to be hospitalized. He was given Zosyn for 5 days. He has history of sepsis and septic arthritis and has been on Cipro until 2 days ago. Last week he was noted to be dehydrated and given IV normal saline 1 L daily for 4 days. He remains on TPN. He receives 4 L of fluid every day. He tells me that O2 sat was in 80s. He wonders if he can get oxygen at home. He does not want to go to the hospital to get ABGs today. He's been running fever since this morning. States he's been having intermittent fever over the last several days but not this high. He has cough with production of scant amount of sputum. He denies chest pain. He complains of intermittent shortness of breath. He continues to complain of abdominal pain which she states has not changed. He is taking Dilaudid via PCA pump every 10-15 minutes while's awaken may go one hour while sleeping. He feels pain control is satisfactory. He is trying to sleep propped up. He has hospital bed at home. He states he has heartburn only if he finds himself flat when he wakes up. He is now only on full liquids. He is not eating solids. He is having 2-3 bowel movements per day. Most of his stools are soft and he denies melena or rectal bleeding. He says gastrostomy tube came out about a week ago. Colostomy bag was placed around the gastric stoma in order to capture draining fluid. Patient says his urine output averages between 800 to 900 ML every 24 hours. He has an appointment with ID specialist at [facility] tomorrow and he is seeing Dr. Pollyann Samples of hepatology service also at  [facility] next week.     Current Medications: Outpatient Encounter Prescriptions as of 10/14/2015  Medication Sig  . cloNIDine (CATAPRES - DOSED IN MG/24 HR) 0.1 mg/24hr patch Place 0.1 mg onto the skin once a week.   Marland Kitchen dextrose 5 % solution   . dicyclomine (BENTYL) 10 MG capsule Take 1 capsule (10 mg total) by mouth 3 (three) times daily as needed (stomach cramps).  . Gabapentin 300 MG/6ML SOLN Take 12 mLs by mouth 3 (three) times daily.  Marland Kitchen HYDROmorphone (DILAUDID) 500 MG/50ML SOLN injection 500 mg. Patient states that he pushes the button ever 10 minutes.  . nystatin cream (MYCOSTATIN) Apply 1 application topically 3 (three) times daily as needed for dry skin. Use as directed  . PAXIL 10 MG/5ML suspension Take 10 mg by mouth daily.   . pentamidine (PENTAM) 300 MG inhalation solution Inhale 300 mg into the lungs every 28 (twenty-eight) days.  . polyethylene glycol (MIRALAX / GLYCOLAX) packet Take 17 g by mouth daily as needed for mild constipation or moderate constipation.   . potassium chloride 20 MEQ/15ML (10%) SOLN Take 30 mLs (40 mEq total) by mouth 3 (three) times daily.  . prednisoLONE (PRELONE) 15 MG/5ML SOLN Take 3.3 mLs by mouth daily.  . sodium chloride 0.9 % infusion as needed.   . calcium-vitamin D (OSCAL 500/200 D-3) 500-200 MG-UNIT per tablet Take 1 tablet by mouth 2 (two) times daily. (Patient not taking: Reported  on 10/14/2015)  . ferrous gluconate (FERGON) 324 MG tablet Take 324 mg by mouth 3 (three) times daily.  . fish oil-omega-3 fatty acids 1000 MG capsule Take 1 g by mouth 3 (three) times daily.  . furosemide (LASIX) 40 MG tablet Take 1 tablet (40 mg total) by mouth daily as needed. (Patient not taking: Reported on 10/14/2015)  . metolazone (ZAROXOLYN) 5 MG tablet Take 1 tablet (5 mg total) by mouth 2 (two) times daily. (Patient not taking: Reported on 10/14/2015)  . pantoprazole (PROTONIX) 40 MG tablet Take 1 tablet (40 mg total) by mouth 2 (two) times daily. (Patient not  taking: Reported on 10/14/2015)  . piperacillin-tazobactam (ZOSYN) 3.375 (3-0.375) G injection Inject 3.375 g into the muscle.   . Probiotic Product (ALIGN) 4 MG CAPS Take 4 mg by mouth daily.   . promethazine (PHENERGAN) 25 MG tablet Take 1 tablet (25 mg total) by mouth 2 (two) times daily as needed. nausea (Patient not taking: Reported on 10/14/2015)  . propranolol (INDERAL) 10 MG tablet Take 10 mg by mouth 2 (two) times daily.   Marland Kitchen spironolactone (ALDACTONE) 25 MG tablet Take 1 tablet (25 mg total) by mouth 2 (two) times daily. (Patient not taking: Reported on 10/14/2015)  . sucralfate (CARAFATE) 1 G tablet Take 1 g by mouth 3 (three) times daily before meals.  . [DISCONTINUED] ciprofloxacin (CIPRO) 400 MG/200ML SOLN Inject 400 mg into the vein every 8 (eight) hours.  . [DISCONTINUED] cloNIDine (CATAPRES) 0.1 MG tablet Take 0.1 mg by mouth daily. This is patch form  . [DISCONTINUED] dextrose 5 % solution as needed.    No facility-administered encounter medications on file as of 10/14/2015.     Objective: Blood pressure 92/58, pulse 64, temperature 101 F (38.3 C), temperature source Oral, resp. rate 16, height  (1.803 m), weight 162 lb 6.4 oz (73.664 kg). Patient is alert and in no acute distress. He appears chronically ill and has generalized muscle wasting. Conjunctiva is pale. Sclera is nonicteric Oropharyngeal mucosa is normal. No neck masses or thyromegaly noted. Neck veins are not elevated. Cardiac exam with regular rhythm normal S1 and S2. No murmur or gallop noted. Auscultation of lungs revealed crackles at both bases.. Abdomen is distended. He has colostomy bag covering gastric fistula. There is drainage of bilious thick material. Abdomen is due he with a large firm tender liver. He has 2-3+ pitting edema involving both legs.  Labs/studies Results:   Recent Labs  10/13/15 1425  WBC 10.1  HGB 9.1*  HCT 28.1*  PLT 69*    BMET   Recent Labs  10/11/15 1420  10/13/15 1425  NA 135 136  K 3.4* 2.9*  CL 97* 94*  CO2 28 35*  GLUCOSE 100* 149*  BUN 84* 75*  CREATININE 2.63* 1.78*  CALCIUM 7.2* 7.4*    LFT   Recent Labs  10/13/15 1425  PROT 6.1*  ALBUMIN 2.0*  AST 31  ALT 16*  ALKPHOS 176*  BILITOT 1.2    PT/INR   Recent Labs  10/13/15 1425  LABPROT 17.2*  INR 1.39    Serum magnesium 2.2  Serum phosphate 4.1 Serum triglycerides 46 Serum pre-albumin 6.2  Prothrombin time 17.2 and INR 1.39  Assessment:  #1. Fever. I'm concerned that he is aspirating.Patient was treated at [facility] for sepsis and septic arthritis recently. He also was treated for aspiration pneumonia. He came off Cipro 2 days ago. He also received 5 days of Zosyn following PEG placement on 10/04/2015 when  he vomited during the procedure. Patient needs to be evaluated in emergency room but he has declined. He has promised to call or go to emergency room if his temp goes any higher. He also has history of pulmonary MAc. #2. Nonfunctional GI tract secondary to complicated history of Crohn's disease. Patient remains on parenteral nutrition which was begun in December 2015. Fluid loss through gastric fistula appears to be primarily due to oral intake. #3. Severe malnutrition despite parenteral TPN. Pre-albumin is 6.2 and it has never been this low in the past. #4. Electrolyte abnormalities. Patient will benefit from a few more boluses of KCl. BUN and creatinine are coming down. He appears to be third spacing again because serum albumin is very low. #5. Anemia. He received 2 units of PRBCs about 2 weeks ago. Anemia is primarily due to chronic disease.       Plan:  Patient will receive 4 boluses KCl; 10 mEq each. Have discussed with need for increasing amino acids in TPN's with Melissa [facility]. Patient encouraged to go to the hospital for chest x-ray and ABGs or go to emergency room but he declined. Patient will try to keep his next  two appointments at [facility]. Office visit in one month.

## 2015-10-14 NOTE — Patient Instructions (Signed)
Call report to emergency room if fever continues. Will arrange for home health care RN to give you IV potassium today

## 2015-10-15 DIAGNOSIS — K50813 Crohn's disease of both small and large intestine with fistula: Secondary | ICD-10-CM | POA: Diagnosis not present

## 2015-10-15 DIAGNOSIS — I10 Essential (primary) hypertension: Secondary | ICD-10-CM | POA: Diagnosis not present

## 2015-10-15 DIAGNOSIS — K269 Duodenal ulcer, unspecified as acute or chronic, without hemorrhage or perforation: Secondary | ICD-10-CM | POA: Diagnosis not present

## 2015-10-15 DIAGNOSIS — J9600 Acute respiratory failure, unspecified whether with hypoxia or hypercapnia: Secondary | ICD-10-CM | POA: Diagnosis not present

## 2015-10-15 DIAGNOSIS — K769 Liver disease, unspecified: Secondary | ICD-10-CM | POA: Diagnosis not present

## 2015-10-16 ENCOUNTER — Other Ambulatory Visit (HOSPITAL_COMMUNITY)
Admission: RE | Admit: 2015-10-16 | Discharge: 2015-10-16 | Disposition: A | Discharge status: Home / Self Care | Payer: Medicare Other | Source: Other Acute Inpatient Hospital | Attending: Internal Medicine | Admitting: Internal Medicine

## 2015-10-16 ENCOUNTER — Telehealth (INDEPENDENT_AMBULATORY_CARE_PROVIDER_SITE_OTHER): Payer: Self-pay | Admitting: *Deleted

## 2015-10-16 DIAGNOSIS — J9600 Acute respiratory failure, unspecified whether with hypoxia or hypercapnia: Secondary | ICD-10-CM | POA: Diagnosis not present

## 2015-10-16 DIAGNOSIS — R7881 Bacteremia: Secondary | ICD-10-CM | POA: Diagnosis not present

## 2015-10-16 DIAGNOSIS — Z5181 Encounter for therapeutic drug level monitoring: Secondary | ICD-10-CM | POA: Diagnosis not present

## 2015-10-16 DIAGNOSIS — K269 Duodenal ulcer, unspecified as acute or chronic, without hemorrhage or perforation: Secondary | ICD-10-CM | POA: Diagnosis not present

## 2015-10-16 DIAGNOSIS — K50813 Crohn's disease of both small and large intestine with fistula: Secondary | ICD-10-CM | POA: Diagnosis not present

## 2015-10-16 DIAGNOSIS — I10 Essential (primary) hypertension: Secondary | ICD-10-CM | POA: Diagnosis not present

## 2015-10-16 DIAGNOSIS — K769 Liver disease, unspecified: Secondary | ICD-10-CM | POA: Diagnosis not present

## 2015-10-16 DIAGNOSIS — K3184 Gastroparesis: Secondary | ICD-10-CM | POA: Diagnosis not present

## 2015-10-16 LAB — BASIC METABOLIC PANEL
ANION GAP: 8 (ref 5–15)
BUN: 73 mg/dL — AB (ref 6–20)
CO2: 38 mmol/L — AB (ref 22–32)
Calcium: 7.2 mg/dL — ABNORMAL LOW (ref 8.9–10.3)
Chloride: 89 mmol/L — ABNORMAL LOW (ref 101–111)
Creatinine, Ser: 1.94 mg/dL — ABNORMAL HIGH (ref 0.61–1.24)
GFR calc Af Amer: 49 mL/min — ABNORMAL LOW (ref >60)
GFR calc non Af Amer: 42 mL/min — ABNORMAL LOW (ref >60)
GLUCOSE: 100 mg/dL — AB (ref 65–99)
POTASSIUM: 3.3 mmol/L — AB (ref 3.5–5.1)
Sodium: 135 mmol/L (ref 135–145)

## 2015-10-16 NOTE — Telephone Encounter (Signed)
Reviewed the lab work from today with Dr.Rehman. He ask that I call the patient and see how he is doing. Patient was called and got voice mail. Ask that he call us to let us know how he is doing. If after 5 pm , he may contact Dr.Rehman.

## 2015-10-17 ENCOUNTER — Telehealth (INDEPENDENT_AMBULATORY_CARE_PROVIDER_SITE_OTHER): Payer: Self-pay | Admitting: *Deleted

## 2015-10-17 DIAGNOSIS — K269 Duodenal ulcer, unspecified as acute or chronic, without hemorrhage or perforation: Secondary | ICD-10-CM | POA: Diagnosis not present

## 2015-10-17 DIAGNOSIS — J9600 Acute respiratory failure, unspecified whether with hypoxia or hypercapnia: Secondary | ICD-10-CM | POA: Diagnosis not present

## 2015-10-17 DIAGNOSIS — K769 Liver disease, unspecified: Secondary | ICD-10-CM | POA: Diagnosis not present

## 2015-10-17 DIAGNOSIS — K50813 Crohn's disease of both small and large intestine with fistula: Secondary | ICD-10-CM | POA: Diagnosis not present

## 2015-10-17 DIAGNOSIS — I10 Essential (primary) hypertension: Secondary | ICD-10-CM | POA: Diagnosis not present

## 2015-10-17 NOTE — Telephone Encounter (Signed)
Rec'd call from [place] [facility] [phone]. She wanted to make sure that we had recd patient's lab work on 10/16/2015. She was wanting to make sure that it would be okay with Dr.Rehman to increase patient's K+ by 25% starting on this Saturday,10/19/15. Dr.Rehman made aware and he states that this is okay. Miranda was called and made aware.  On 10/16/2015 and today I have tried calling the patient ,getting his voicemail both times. Per Dr.Rehman we need to know if he is still running fever,if so we need to order Chest X-Ray and get ABG's. We also need to know  how his visit with the ID @ [facility] went a few days ago.  I have reached out to his homehealth nurse [name] via a voicemail to see is she could help.

## 2015-10-17 NOTE — Telephone Encounter (Signed)
Wyatt Mage ,nurse with [facility], called and states that she saw David [name] on yesterday and he was afebrile. He had shared with her if his temp went greater tham 101 he was to go to the ED. That he was watching it. She text him after getting my message and states he text back saying that he was okay and was just wanting to stay at home. Also he was having trouble with his phone.  Dr.Rehman to be made aware.

## 2015-10-18 ENCOUNTER — Encounter (INDEPENDENT_AMBULATORY_CARE_PROVIDER_SITE_OTHER): Payer: Self-pay

## 2015-10-18 DIAGNOSIS — K769 Liver disease, unspecified: Secondary | ICD-10-CM | POA: Diagnosis not present

## 2015-10-18 DIAGNOSIS — I10 Essential (primary) hypertension: Secondary | ICD-10-CM | POA: Diagnosis not present

## 2015-10-18 DIAGNOSIS — K50813 Crohn's disease of both small and large intestine with fistula: Secondary | ICD-10-CM | POA: Diagnosis not present

## 2015-10-18 DIAGNOSIS — K269 Duodenal ulcer, unspecified as acute or chronic, without hemorrhage or perforation: Secondary | ICD-10-CM | POA: Diagnosis not present

## 2015-10-18 DIAGNOSIS — J9600 Acute respiratory failure, unspecified whether with hypoxia or hypercapnia: Secondary | ICD-10-CM | POA: Diagnosis not present

## 2015-10-19 DIAGNOSIS — I10 Essential (primary) hypertension: Secondary | ICD-10-CM | POA: Diagnosis not present

## 2015-10-19 DIAGNOSIS — J9600 Acute respiratory failure, unspecified whether with hypoxia or hypercapnia: Secondary | ICD-10-CM | POA: Diagnosis not present

## 2015-10-19 DIAGNOSIS — K50813 Crohn's disease of both small and large intestine with fistula: Secondary | ICD-10-CM | POA: Diagnosis not present

## 2015-10-19 DIAGNOSIS — K769 Liver disease, unspecified: Secondary | ICD-10-CM | POA: Diagnosis not present

## 2015-10-19 DIAGNOSIS — K269 Duodenal ulcer, unspecified as acute or chronic, without hemorrhage or perforation: Secondary | ICD-10-CM | POA: Diagnosis not present

## 2015-10-20 DIAGNOSIS — I10 Essential (primary) hypertension: Secondary | ICD-10-CM | POA: Diagnosis not present

## 2015-10-20 DIAGNOSIS — K50813 Crohn's disease of both small and large intestine with fistula: Secondary | ICD-10-CM | POA: Diagnosis not present

## 2015-10-20 DIAGNOSIS — J9600 Acute respiratory failure, unspecified whether with hypoxia or hypercapnia: Secondary | ICD-10-CM | POA: Diagnosis not present

## 2015-10-20 DIAGNOSIS — K269 Duodenal ulcer, unspecified as acute or chronic, without hemorrhage or perforation: Secondary | ICD-10-CM | POA: Diagnosis not present

## 2015-10-20 DIAGNOSIS — K769 Liver disease, unspecified: Secondary | ICD-10-CM | POA: Diagnosis not present

## 2015-10-21 ENCOUNTER — Encounter (HOSPITAL_COMMUNITY): Payer: Self-pay | Admitting: Emergency Medicine

## 2015-10-21 ENCOUNTER — Emergency Department (HOSPITAL_COMMUNITY)
Admission: EM | Admit: 2015-10-21 | Discharge: 2015-10-21 | Disposition: A | Discharge status: Home / Self Care | Payer: Medicare Other | Attending: Emergency Medicine | Admitting: Emergency Medicine

## 2015-10-21 ENCOUNTER — Telehealth (INDEPENDENT_AMBULATORY_CARE_PROVIDER_SITE_OTHER): Payer: Self-pay | Admitting: *Deleted

## 2015-10-21 DIAGNOSIS — Z452 Encounter for adjustment and management of vascular access device: Secondary | ICD-10-CM | POA: Diagnosis not present

## 2015-10-21 DIAGNOSIS — I959 Hypotension, unspecified: Secondary | ICD-10-CM | POA: Insufficient documentation

## 2015-10-21 DIAGNOSIS — I10 Essential (primary) hypertension: Secondary | ICD-10-CM | POA: Insufficient documentation

## 2015-10-21 DIAGNOSIS — K269 Duodenal ulcer, unspecified as acute or chronic, without hemorrhage or perforation: Secondary | ICD-10-CM | POA: Diagnosis not present

## 2015-10-21 DIAGNOSIS — E43 Unspecified severe protein-calorie malnutrition: Secondary | ICD-10-CM | POA: Diagnosis not present

## 2015-10-21 DIAGNOSIS — E860 Dehydration: Secondary | ICD-10-CM | POA: Insufficient documentation

## 2015-10-21 DIAGNOSIS — K50813 Crohn's disease of both small and large intestine with fistula: Secondary | ICD-10-CM | POA: Diagnosis not present

## 2015-10-21 DIAGNOSIS — K769 Liver disease, unspecified: Secondary | ICD-10-CM | POA: Diagnosis not present

## 2015-10-21 DIAGNOSIS — R7881 Bacteremia: Secondary | ICD-10-CM | POA: Diagnosis not present

## 2015-10-21 DIAGNOSIS — Z5181 Encounter for therapeutic drug level monitoring: Secondary | ICD-10-CM | POA: Diagnosis not present

## 2015-10-21 DIAGNOSIS — J9600 Acute respiratory failure, unspecified whether with hypoxia or hypercapnia: Secondary | ICD-10-CM | POA: Diagnosis not present

## 2015-10-21 DIAGNOSIS — K3184 Gastroparesis: Secondary | ICD-10-CM | POA: Diagnosis not present

## 2015-10-21 NOTE — ED Notes (Signed)
Spoke with pts mother and informed her that pts BP is very low and pt needs to be seen by Provider.  Mother spoke with her son and they have agreed to come back to ED to be seen by Provider.  Registration informed that pt is going to return to be seen.

## 2015-10-21 NOTE — ED Notes (Signed)
Pt mother says pts foley is leaking.  Offered to give pt a gown and place in waiting room behind triage but mother was upset that we could not take pt back immediately.  Pt is not in any visual distress.  Asked pt several times to stay because of low BP, mother and pt decided to leave before being seen by Provider.

## 2015-10-21 NOTE — Telephone Encounter (Signed)
Rec'd a phone message from patient's mother. She states that patient's wife called her in tears,stating that David [name] had went to the bathroom and feel over into the tub. She got him up and applied a patch to his back. Patient stated that he was okay. He slept all day yesterday , refused TPN, and was jerking. Mother was advised per Dr.Rehman to go to the ED, and he refused at this time.  Home Health Nurse , Wyatt Mage , has went out to see patient. She called office with the following vital signs. BP-80/42 ,Pulse 114 , O2 room air was between 91 and 92. She confirmed he was dehydrated , weak and twitching. She talked with the patient and he told her that he would go to the ED, he is fearful that he will be kept and is wanting to come back home.  His mother is going to bring him to the ED in her [facility] Niece. Dr.Rehman is aware.

## 2015-10-21 NOTE — ED Notes (Signed)
Called pts mother at [phone] and pt says he is not coming back.

## 2015-10-21 NOTE — ED Notes (Signed)
Pt has foley from home.  Placed about 2 weeks ago per pt.

## 2015-10-21 NOTE — ED Notes (Signed)
Having weakness for last few days.  Larey Seat last know in bath tub hitting back of head and back.  Seen by Home Health this am and BP 84/42.  Currently BP 99/61.

## 2015-10-21 NOTE — ED Notes (Signed)
Received call from Larose Hires at Dr Larrie Kass office.  Says pt was brought into office by mother.  Mother was upset about pt not being seen at ED.  Was given pt's mother cell number.

## 2015-10-22 DIAGNOSIS — J9600 Acute respiratory failure, unspecified whether with hypoxia or hypercapnia: Secondary | ICD-10-CM | POA: Diagnosis not present

## 2015-10-22 DIAGNOSIS — K269 Duodenal ulcer, unspecified as acute or chronic, without hemorrhage or perforation: Secondary | ICD-10-CM | POA: Diagnosis not present

## 2015-10-22 DIAGNOSIS — K769 Liver disease, unspecified: Secondary | ICD-10-CM | POA: Diagnosis not present

## 2015-10-22 DIAGNOSIS — I10 Essential (primary) hypertension: Secondary | ICD-10-CM | POA: Diagnosis not present

## 2015-10-22 DIAGNOSIS — K50813 Crohn's disease of both small and large intestine with fistula: Secondary | ICD-10-CM | POA: Diagnosis not present

## 2015-10-23 DIAGNOSIS — K269 Duodenal ulcer, unspecified as acute or chronic, without hemorrhage or perforation: Secondary | ICD-10-CM | POA: Diagnosis not present

## 2015-10-23 DIAGNOSIS — K769 Liver disease, unspecified: Secondary | ICD-10-CM | POA: Diagnosis not present

## 2015-10-23 DIAGNOSIS — K50813 Crohn's disease of both small and large intestine with fistula: Secondary | ICD-10-CM | POA: Diagnosis not present

## 2015-10-23 DIAGNOSIS — J9600 Acute respiratory failure, unspecified whether with hypoxia or hypercapnia: Secondary | ICD-10-CM | POA: Diagnosis not present

## 2015-10-23 DIAGNOSIS — I10 Essential (primary) hypertension: Secondary | ICD-10-CM | POA: Diagnosis not present

## 2015-10-24 ENCOUNTER — Encounter (INDEPENDENT_AMBULATORY_CARE_PROVIDER_SITE_OTHER): Payer: Self-pay

## 2015-10-24 ENCOUNTER — Other Ambulatory Visit (HOSPITAL_COMMUNITY)
Admission: RE | Admit: 2015-10-24 | Discharge: 2015-10-24 | Disposition: A | Discharge status: Home / Self Care | Payer: Medicare Other | Source: Other Acute Inpatient Hospital | Attending: Internal Medicine | Admitting: Internal Medicine

## 2015-10-24 DIAGNOSIS — K769 Liver disease, unspecified: Secondary | ICD-10-CM | POA: Diagnosis not present

## 2015-10-24 DIAGNOSIS — Z452 Encounter for adjustment and management of vascular access device: Secondary | ICD-10-CM | POA: Diagnosis not present

## 2015-10-24 DIAGNOSIS — R7881 Bacteremia: Secondary | ICD-10-CM | POA: Diagnosis not present

## 2015-10-24 DIAGNOSIS — K3184 Gastroparesis: Secondary | ICD-10-CM | POA: Diagnosis not present

## 2015-10-24 DIAGNOSIS — J9600 Acute respiratory failure, unspecified whether with hypoxia or hypercapnia: Secondary | ICD-10-CM | POA: Diagnosis not present

## 2015-10-24 DIAGNOSIS — K269 Duodenal ulcer, unspecified as acute or chronic, without hemorrhage or perforation: Secondary | ICD-10-CM | POA: Diagnosis not present

## 2015-10-24 DIAGNOSIS — I10 Essential (primary) hypertension: Secondary | ICD-10-CM | POA: Diagnosis not present

## 2015-10-24 DIAGNOSIS — Z5181 Encounter for therapeutic drug level monitoring: Secondary | ICD-10-CM | POA: Insufficient documentation

## 2015-10-24 DIAGNOSIS — K50813 Crohn's disease of both small and large intestine with fistula: Secondary | ICD-10-CM | POA: Diagnosis not present

## 2015-10-24 LAB — POTASSIUM: POTASSIUM: 2.6 mmol/L — AB (ref 3.5–5.1)

## 2015-10-25 DIAGNOSIS — I10 Essential (primary) hypertension: Secondary | ICD-10-CM | POA: Diagnosis not present

## 2015-10-25 DIAGNOSIS — J9600 Acute respiratory failure, unspecified whether with hypoxia or hypercapnia: Secondary | ICD-10-CM | POA: Diagnosis not present

## 2015-10-25 DIAGNOSIS — K269 Duodenal ulcer, unspecified as acute or chronic, without hemorrhage or perforation: Secondary | ICD-10-CM | POA: Diagnosis not present

## 2015-10-25 DIAGNOSIS — K769 Liver disease, unspecified: Secondary | ICD-10-CM | POA: Diagnosis not present

## 2015-10-25 DIAGNOSIS — K50813 Crohn's disease of both small and large intestine with fistula: Secondary | ICD-10-CM | POA: Diagnosis not present

## 2015-10-26 DIAGNOSIS — K269 Duodenal ulcer, unspecified as acute or chronic, without hemorrhage or perforation: Secondary | ICD-10-CM | POA: Diagnosis not present

## 2015-10-26 DIAGNOSIS — K769 Liver disease, unspecified: Secondary | ICD-10-CM | POA: Diagnosis not present

## 2015-10-26 DIAGNOSIS — K50813 Crohn's disease of both small and large intestine with fistula: Secondary | ICD-10-CM | POA: Diagnosis not present

## 2015-10-26 DIAGNOSIS — I10 Essential (primary) hypertension: Secondary | ICD-10-CM | POA: Diagnosis not present

## 2015-10-26 DIAGNOSIS — J9600 Acute respiratory failure, unspecified whether with hypoxia or hypercapnia: Secondary | ICD-10-CM | POA: Diagnosis not present

## 2015-10-27 DIAGNOSIS — K769 Liver disease, unspecified: Secondary | ICD-10-CM | POA: Diagnosis not present

## 2015-10-27 DIAGNOSIS — K50813 Crohn's disease of both small and large intestine with fistula: Secondary | ICD-10-CM | POA: Diagnosis not present

## 2015-10-27 DIAGNOSIS — I10 Essential (primary) hypertension: Secondary | ICD-10-CM | POA: Diagnosis not present

## 2015-10-27 DIAGNOSIS — J9600 Acute respiratory failure, unspecified whether with hypoxia or hypercapnia: Secondary | ICD-10-CM | POA: Diagnosis not present

## 2015-10-27 DIAGNOSIS — K269 Duodenal ulcer, unspecified as acute or chronic, without hemorrhage or perforation: Secondary | ICD-10-CM | POA: Diagnosis not present

## 2015-10-28 DIAGNOSIS — R7881 Bacteremia: Secondary | ICD-10-CM | POA: Diagnosis not present

## 2015-10-28 DIAGNOSIS — J9600 Acute respiratory failure, unspecified whether with hypoxia or hypercapnia: Secondary | ICD-10-CM | POA: Diagnosis not present

## 2015-10-28 DIAGNOSIS — I10 Essential (primary) hypertension: Secondary | ICD-10-CM | POA: Diagnosis not present

## 2015-10-28 DIAGNOSIS — K269 Duodenal ulcer, unspecified as acute or chronic, without hemorrhage or perforation: Secondary | ICD-10-CM | POA: Diagnosis not present

## 2015-10-28 DIAGNOSIS — Z5181 Encounter for therapeutic drug level monitoring: Secondary | ICD-10-CM | POA: Diagnosis not present

## 2015-10-28 DIAGNOSIS — Z452 Encounter for adjustment and management of vascular access device: Secondary | ICD-10-CM | POA: Diagnosis not present

## 2015-10-28 DIAGNOSIS — E43 Unspecified severe protein-calorie malnutrition: Secondary | ICD-10-CM | POA: Diagnosis not present

## 2015-10-28 DIAGNOSIS — K769 Liver disease, unspecified: Secondary | ICD-10-CM | POA: Diagnosis not present

## 2015-10-28 DIAGNOSIS — K3184 Gastroparesis: Secondary | ICD-10-CM | POA: Diagnosis not present

## 2015-10-28 DIAGNOSIS — K50813 Crohn's disease of both small and large intestine with fistula: Secondary | ICD-10-CM | POA: Diagnosis not present

## 2015-10-29 DIAGNOSIS — K269 Duodenal ulcer, unspecified as acute or chronic, without hemorrhage or perforation: Secondary | ICD-10-CM | POA: Diagnosis not present

## 2015-10-29 DIAGNOSIS — I10 Essential (primary) hypertension: Secondary | ICD-10-CM | POA: Diagnosis not present

## 2015-10-29 DIAGNOSIS — K769 Liver disease, unspecified: Secondary | ICD-10-CM | POA: Diagnosis not present

## 2015-10-29 DIAGNOSIS — J9600 Acute respiratory failure, unspecified whether with hypoxia or hypercapnia: Secondary | ICD-10-CM | POA: Diagnosis not present

## 2015-10-29 DIAGNOSIS — K50813 Crohn's disease of both small and large intestine with fistula: Secondary | ICD-10-CM | POA: Diagnosis not present

## 2015-10-30 DIAGNOSIS — K269 Duodenal ulcer, unspecified as acute or chronic, without hemorrhage or perforation: Secondary | ICD-10-CM | POA: Diagnosis not present

## 2015-10-30 DIAGNOSIS — K769 Liver disease, unspecified: Secondary | ICD-10-CM | POA: Diagnosis not present

## 2015-10-30 DIAGNOSIS — I10 Essential (primary) hypertension: Secondary | ICD-10-CM | POA: Diagnosis not present

## 2015-10-30 DIAGNOSIS — K50813 Crohn's disease of both small and large intestine with fistula: Secondary | ICD-10-CM | POA: Diagnosis not present

## 2015-10-30 DIAGNOSIS — J9600 Acute respiratory failure, unspecified whether with hypoxia or hypercapnia: Secondary | ICD-10-CM | POA: Diagnosis not present

## 2015-10-31 ENCOUNTER — Encounter (HOSPITAL_COMMUNITY)
Admission: RE | Admit: 2015-10-31 | Discharge: 2015-10-31 | Disposition: A | Discharge status: Home / Self Care | Payer: Medicare Other | Source: Ambulatory Visit | Attending: Internal Medicine | Admitting: Internal Medicine

## 2015-10-31 ENCOUNTER — Encounter (INDEPENDENT_AMBULATORY_CARE_PROVIDER_SITE_OTHER): Payer: Self-pay

## 2015-10-31 DIAGNOSIS — K769 Liver disease, unspecified: Secondary | ICD-10-CM | POA: Diagnosis not present

## 2015-10-31 DIAGNOSIS — K50813 Crohn's disease of both small and large intestine with fistula: Secondary | ICD-10-CM | POA: Diagnosis not present

## 2015-10-31 DIAGNOSIS — I10 Essential (primary) hypertension: Secondary | ICD-10-CM | POA: Diagnosis not present

## 2015-10-31 DIAGNOSIS — D649 Anemia, unspecified: Secondary | ICD-10-CM | POA: Diagnosis not present

## 2015-10-31 DIAGNOSIS — K269 Duodenal ulcer, unspecified as acute or chronic, without hemorrhage or perforation: Secondary | ICD-10-CM | POA: Diagnosis not present

## 2015-10-31 DIAGNOSIS — J9600 Acute respiratory failure, unspecified whether with hypoxia or hypercapnia: Secondary | ICD-10-CM | POA: Diagnosis not present

## 2015-10-31 LAB — HEMOGLOBIN AND HEMATOCRIT, BLOOD
HCT: 22.8 % — ABNORMAL LOW (ref 39.0–52.0)
Hemoglobin: 7.6 g/dL — ABNORMAL LOW (ref 13.0–17.0)

## 2015-10-31 LAB — PREPARE RBC (CROSSMATCH)

## 2015-10-31 NOTE — Progress Notes (Signed)
Results for , MCGLADE (MRN [id number]) as of 10/31/2015 14:42 Blood transfusion for 11/01/2015 @ 0800  Ref. Range 10/31/2015 11:00 10/31/2015 11:00  Hemoglobin Latest Ref Range: 13.0-17.0 g/dL 7.6 (L)   HCT Latest Ref Range: 39.0-52.0 % 22.8 (L)   Sample Expiration Unknown 11/03/2015   Antibody Screen Unknown NEG   ABO/RH(D) Unknown A POS   Unit Number Unknown [id number]  Blood Component Type Unknown RED CELLS,LR RED CELLS,LR  Unit division Unknown 00 00  Status of Unit Unknown ALLOCATED ALLOCATED  Transfusion Status Unknown OK TO TRANSFUSE OK TO TRANSFUSE  Crossmatch Result Unknown Compatible Compatible  Order Confirmation Unknown ORDER PROCESSED B.Marland KitchenMarland Kitchen

## 2015-11-01 ENCOUNTER — Encounter (HOSPITAL_COMMUNITY)
Admission: RE | Admit: 2015-11-01 | Discharge: 2015-11-01 | Disposition: A | Discharge status: Home / Self Care | Payer: Medicare Other | Source: Ambulatory Visit | Attending: Internal Medicine | Admitting: Internal Medicine

## 2015-11-01 DIAGNOSIS — D649 Anemia, unspecified: Secondary | ICD-10-CM | POA: Diagnosis not present

## 2015-11-01 LAB — HEMOGLOBIN AND HEMATOCRIT, BLOOD
HEMATOCRIT: 26.2 % — AB (ref 39.0–52.0)
Hemoglobin: 9 g/dL — ABNORMAL LOW (ref 13.0–17.0)

## 2015-11-01 MED ORDER — SODIUM CHLORIDE 0.9 % IV SOLN
Freq: Once | INTRAVENOUS | Status: AC
  Administered 2015-11-01: 08:00:00 via INTRAVENOUS

## 2015-11-01 MED ORDER — ACETAMINOPHEN 325 MG PO TABS
650.0000 mg | ORAL_TABLET | Freq: Once | ORAL | Status: AC
  Administered 2015-11-01: 650 mg via ORAL

## 2015-11-01 MED ORDER — DIPHENHYDRAMINE HCL 25 MG PO CAPS
50.0000 mg | ORAL_CAPSULE | Freq: Once | ORAL | Status: AC
  Administered 2015-11-01: 25 mg via ORAL

## 2015-11-01 MED ORDER — DIPHENHYDRAMINE HCL 25 MG PO CAPS
ORAL_CAPSULE | ORAL | Status: AC
  Filled 2015-11-01: qty 1

## 2015-11-01 MED ORDER — ACETAMINOPHEN 325 MG PO TABS
ORAL_TABLET | ORAL | Status: AC
  Filled 2015-11-01: qty 2

## 2015-11-01 NOTE — Progress Notes (Signed)
Results for NUR, KRALIK (MRN [id number]) as of 11/01/2015 16:01  Ref. Range 11/01/2015 13:53  Hemoglobin Latest Ref Range: 13.0-17.0 g/dL 9.0 (L)  HCT Latest Ref Range: 39.0-52.0 % 26.2 (L)

## 2015-11-02 DIAGNOSIS — J9600 Acute respiratory failure, unspecified whether with hypoxia or hypercapnia: Secondary | ICD-10-CM | POA: Diagnosis not present

## 2015-11-02 DIAGNOSIS — K269 Duodenal ulcer, unspecified as acute or chronic, without hemorrhage or perforation: Secondary | ICD-10-CM | POA: Diagnosis not present

## 2015-11-02 DIAGNOSIS — I10 Essential (primary) hypertension: Secondary | ICD-10-CM | POA: Diagnosis not present

## 2015-11-02 DIAGNOSIS — K769 Liver disease, unspecified: Secondary | ICD-10-CM | POA: Diagnosis not present

## 2015-11-02 DIAGNOSIS — K50813 Crohn's disease of both small and large intestine with fistula: Secondary | ICD-10-CM | POA: Diagnosis not present

## 2015-11-02 LAB — TYPE AND SCREEN
ABO/RH(D): A POS
ANTIBODY SCREEN: NEGATIVE
Unit division: 0
Unit division: 0

## 2015-11-03 ENCOUNTER — Other Ambulatory Visit (HOSPITAL_COMMUNITY)
Admission: RE | Admit: 2015-11-03 | Discharge: 2015-11-03 | Disposition: A | Discharge status: Home / Self Care | Payer: Medicare Other | Source: Other Acute Inpatient Hospital | Attending: Internal Medicine | Admitting: Internal Medicine

## 2015-11-03 DIAGNOSIS — E43 Unspecified severe protein-calorie malnutrition: Secondary | ICD-10-CM | POA: Diagnosis not present

## 2015-11-03 DIAGNOSIS — Z5181 Encounter for therapeutic drug level monitoring: Secondary | ICD-10-CM | POA: Diagnosis not present

## 2015-11-03 DIAGNOSIS — J9600 Acute respiratory failure, unspecified whether with hypoxia or hypercapnia: Secondary | ICD-10-CM | POA: Diagnosis not present

## 2015-11-03 DIAGNOSIS — K269 Duodenal ulcer, unspecified as acute or chronic, without hemorrhage or perforation: Secondary | ICD-10-CM | POA: Diagnosis not present

## 2015-11-03 DIAGNOSIS — K3184 Gastroparesis: Secondary | ICD-10-CM | POA: Diagnosis not present

## 2015-11-03 DIAGNOSIS — Z452 Encounter for adjustment and management of vascular access device: Secondary | ICD-10-CM | POA: Insufficient documentation

## 2015-11-03 DIAGNOSIS — R7881 Bacteremia: Secondary | ICD-10-CM | POA: Diagnosis not present

## 2015-11-03 DIAGNOSIS — I10 Essential (primary) hypertension: Secondary | ICD-10-CM | POA: Diagnosis not present

## 2015-11-03 DIAGNOSIS — K50813 Crohn's disease of both small and large intestine with fistula: Secondary | ICD-10-CM | POA: Diagnosis not present

## 2015-11-03 DIAGNOSIS — K769 Liver disease, unspecified: Secondary | ICD-10-CM | POA: Diagnosis not present

## 2015-11-03 LAB — CBC WITH DIFFERENTIAL/PLATELET
BASOS ABS: 0 10*3/uL (ref 0.0–0.1)
BASOS PCT: 0 %
Eosinophils Absolute: 0.2 10*3/uL (ref 0.0–0.7)
Eosinophils Relative: 3 %
HEMATOCRIT: 29.1 % — AB (ref 39.0–52.0)
HEMOGLOBIN: 9.6 g/dL — AB (ref 13.0–17.0)
Lymphocytes Relative: 9 %
Lymphs Abs: 0.6 10*3/uL — ABNORMAL LOW (ref 0.7–4.0)
MCH: 31.6 pg (ref 26.0–34.0)
MCHC: 33 g/dL (ref 30.0–36.0)
MCV: 95.7 fL (ref 78.0–100.0)
Monocytes Absolute: 0.6 10*3/uL (ref 0.1–1.0)
Monocytes Relative: 9 %
NEUTROS ABS: 5.4 10*3/uL (ref 1.7–7.7)
NEUTROS PCT: 79 %
Platelets: 84 10*3/uL — ABNORMAL LOW (ref 150–400)
RBC: 3.04 MIL/uL — ABNORMAL LOW (ref 4.22–5.81)
RDW: 17.1 % — AB (ref 11.5–15.5)
WBC: 6.9 10*3/uL (ref 4.0–10.5)

## 2015-11-03 LAB — PHOSPHORUS: Phosphorus: 4.1 mg/dL (ref 2.5–4.6)

## 2015-11-03 LAB — COMPREHENSIVE METABOLIC PANEL
ALBUMIN: 2.1 g/dL — AB (ref 3.5–5.0)
ALK PHOS: 379 U/L — AB (ref 38–126)
ALT: 32 U/L (ref 17–63)
AST: 67 U/L — AB (ref 15–41)
Anion gap: 4 — ABNORMAL LOW (ref 5–15)
BILIRUBIN TOTAL: 0.8 mg/dL (ref 0.3–1.2)
BUN: 35 mg/dL — AB (ref 6–20)
CO2: 39 mmol/L — AB (ref 22–32)
Calcium: 7.7 mg/dL — ABNORMAL LOW (ref 8.9–10.3)
Chloride: 90 mmol/L — ABNORMAL LOW (ref 101–111)
Creatinine, Ser: 1.23 mg/dL (ref 0.61–1.24)
GFR calc Af Amer: >60 mL/min (ref >60)
GFR calc non Af Amer: >60 mL/min (ref >60)
GLUCOSE: 128 mg/dL — AB (ref 65–99)
POTASSIUM: 3.2 mmol/L — AB (ref 3.5–5.1)
SODIUM: 133 mmol/L — AB (ref 135–145)
TOTAL PROTEIN: 6.9 g/dL (ref 6.5–8.1)

## 2015-11-03 LAB — PROTIME-INR
INR: 1.24 (ref 0.00–1.49)
Prothrombin Time: 15.7 seconds — ABNORMAL HIGH (ref 11.6–15.2)

## 2015-11-03 LAB — MAGNESIUM: Magnesium: 1.9 mg/dL (ref 1.7–2.4)

## 2015-11-04 DIAGNOSIS — J9600 Acute respiratory failure, unspecified whether with hypoxia or hypercapnia: Secondary | ICD-10-CM | POA: Diagnosis not present

## 2015-11-04 DIAGNOSIS — K769 Liver disease, unspecified: Secondary | ICD-10-CM | POA: Diagnosis not present

## 2015-11-04 DIAGNOSIS — K269 Duodenal ulcer, unspecified as acute or chronic, without hemorrhage or perforation: Secondary | ICD-10-CM | POA: Diagnosis not present

## 2015-11-04 DIAGNOSIS — I10 Essential (primary) hypertension: Secondary | ICD-10-CM | POA: Diagnosis not present

## 2015-11-04 DIAGNOSIS — K50813 Crohn's disease of both small and large intestine with fistula: Secondary | ICD-10-CM | POA: Diagnosis not present

## 2015-11-05 DIAGNOSIS — K269 Duodenal ulcer, unspecified as acute or chronic, without hemorrhage or perforation: Secondary | ICD-10-CM | POA: Diagnosis not present

## 2015-11-05 DIAGNOSIS — K769 Liver disease, unspecified: Secondary | ICD-10-CM | POA: Diagnosis not present

## 2015-11-05 DIAGNOSIS — I10 Essential (primary) hypertension: Secondary | ICD-10-CM | POA: Diagnosis not present

## 2015-11-05 DIAGNOSIS — J9600 Acute respiratory failure, unspecified whether with hypoxia or hypercapnia: Secondary | ICD-10-CM | POA: Diagnosis not present

## 2015-11-05 DIAGNOSIS — K50813 Crohn's disease of both small and large intestine with fistula: Secondary | ICD-10-CM | POA: Diagnosis not present

## 2015-11-06 DIAGNOSIS — K269 Duodenal ulcer, unspecified as acute or chronic, without hemorrhage or perforation: Secondary | ICD-10-CM | POA: Diagnosis not present

## 2015-11-06 DIAGNOSIS — K769 Liver disease, unspecified: Secondary | ICD-10-CM | POA: Diagnosis not present

## 2015-11-06 DIAGNOSIS — J9600 Acute respiratory failure, unspecified whether with hypoxia or hypercapnia: Secondary | ICD-10-CM | POA: Diagnosis not present

## 2015-11-06 DIAGNOSIS — K50813 Crohn's disease of both small and large intestine with fistula: Secondary | ICD-10-CM | POA: Diagnosis not present

## 2015-11-06 DIAGNOSIS — I10 Essential (primary) hypertension: Secondary | ICD-10-CM | POA: Diagnosis not present

## 2015-11-07 DIAGNOSIS — K769 Liver disease, unspecified: Secondary | ICD-10-CM | POA: Diagnosis not present

## 2015-11-07 DIAGNOSIS — K50813 Crohn's disease of both small and large intestine with fistula: Secondary | ICD-10-CM | POA: Diagnosis not present

## 2015-11-07 DIAGNOSIS — I10 Essential (primary) hypertension: Secondary | ICD-10-CM | POA: Diagnosis not present

## 2015-11-07 DIAGNOSIS — J9600 Acute respiratory failure, unspecified whether with hypoxia or hypercapnia: Secondary | ICD-10-CM | POA: Diagnosis not present

## 2015-11-07 DIAGNOSIS — K269 Duodenal ulcer, unspecified as acute or chronic, without hemorrhage or perforation: Secondary | ICD-10-CM | POA: Diagnosis not present

## 2015-11-08 DIAGNOSIS — K269 Duodenal ulcer, unspecified as acute or chronic, without hemorrhage or perforation: Secondary | ICD-10-CM | POA: Diagnosis not present

## 2015-11-08 DIAGNOSIS — K769 Liver disease, unspecified: Secondary | ICD-10-CM | POA: Diagnosis not present

## 2015-11-08 DIAGNOSIS — I10 Essential (primary) hypertension: Secondary | ICD-10-CM | POA: Diagnosis not present

## 2015-11-08 DIAGNOSIS — K50813 Crohn's disease of both small and large intestine with fistula: Secondary | ICD-10-CM | POA: Diagnosis not present

## 2015-11-08 DIAGNOSIS — J9600 Acute respiratory failure, unspecified whether with hypoxia or hypercapnia: Secondary | ICD-10-CM | POA: Diagnosis not present

## 2015-11-09 DIAGNOSIS — I10 Essential (primary) hypertension: Secondary | ICD-10-CM | POA: Diagnosis not present

## 2015-11-09 DIAGNOSIS — J9600 Acute respiratory failure, unspecified whether with hypoxia or hypercapnia: Secondary | ICD-10-CM | POA: Diagnosis not present

## 2015-11-09 DIAGNOSIS — K769 Liver disease, unspecified: Secondary | ICD-10-CM | POA: Diagnosis not present

## 2015-11-09 DIAGNOSIS — K269 Duodenal ulcer, unspecified as acute or chronic, without hemorrhage or perforation: Secondary | ICD-10-CM | POA: Diagnosis not present

## 2015-11-09 DIAGNOSIS — K50813 Crohn's disease of both small and large intestine with fistula: Secondary | ICD-10-CM | POA: Diagnosis not present

## 2015-11-10 DIAGNOSIS — K50813 Crohn's disease of both small and large intestine with fistula: Secondary | ICD-10-CM | POA: Diagnosis not present

## 2015-11-10 DIAGNOSIS — K769 Liver disease, unspecified: Secondary | ICD-10-CM | POA: Diagnosis not present

## 2015-11-10 DIAGNOSIS — I10 Essential (primary) hypertension: Secondary | ICD-10-CM | POA: Diagnosis not present

## 2015-11-10 DIAGNOSIS — J9600 Acute respiratory failure, unspecified whether with hypoxia or hypercapnia: Secondary | ICD-10-CM | POA: Diagnosis not present

## 2015-11-10 DIAGNOSIS — K269 Duodenal ulcer, unspecified as acute or chronic, without hemorrhage or perforation: Secondary | ICD-10-CM | POA: Diagnosis not present

## 2015-11-11 DIAGNOSIS — K50813 Crohn's disease of both small and large intestine with fistula: Secondary | ICD-10-CM | POA: Diagnosis not present

## 2015-11-11 DIAGNOSIS — I10 Essential (primary) hypertension: Secondary | ICD-10-CM | POA: Diagnosis not present

## 2015-11-11 DIAGNOSIS — K269 Duodenal ulcer, unspecified as acute or chronic, without hemorrhage or perforation: Secondary | ICD-10-CM | POA: Diagnosis not present

## 2015-11-11 DIAGNOSIS — K769 Liver disease, unspecified: Secondary | ICD-10-CM | POA: Diagnosis not present

## 2015-11-11 DIAGNOSIS — R7881 Bacteremia: Secondary | ICD-10-CM | POA: Diagnosis not present

## 2015-11-11 DIAGNOSIS — J9600 Acute respiratory failure, unspecified whether with hypoxia or hypercapnia: Secondary | ICD-10-CM | POA: Diagnosis not present

## 2015-11-11 DIAGNOSIS — K3184 Gastroparesis: Secondary | ICD-10-CM | POA: Diagnosis not present

## 2015-11-12 ENCOUNTER — Ambulatory Visit (INDEPENDENT_AMBULATORY_CARE_PROVIDER_SITE_OTHER): Payer: Medicare Other | Admitting: Internal Medicine

## 2015-11-12 ENCOUNTER — Encounter (INDEPENDENT_AMBULATORY_CARE_PROVIDER_SITE_OTHER): Payer: Self-pay | Admitting: Internal Medicine

## 2015-11-12 ENCOUNTER — Encounter (INDEPENDENT_AMBULATORY_CARE_PROVIDER_SITE_OTHER): Payer: Self-pay | Admitting: *Deleted

## 2015-11-12 VITALS — BP 100/70 | HR 112 | Temp 98.9°F | Resp 18 | Ht 71.0 in | Wt 143.4 lb

## 2015-11-12 DIAGNOSIS — R112 Nausea with vomiting, unspecified: Secondary | ICD-10-CM

## 2015-11-12 DIAGNOSIS — K769 Liver disease, unspecified: Secondary | ICD-10-CM | POA: Diagnosis not present

## 2015-11-12 DIAGNOSIS — J9600 Acute respiratory failure, unspecified whether with hypoxia or hypercapnia: Secondary | ICD-10-CM | POA: Diagnosis not present

## 2015-11-12 DIAGNOSIS — R1084 Generalized abdominal pain: Secondary | ICD-10-CM

## 2015-11-12 DIAGNOSIS — I10 Essential (primary) hypertension: Secondary | ICD-10-CM | POA: Diagnosis not present

## 2015-11-12 DIAGNOSIS — K2100 Gastro-esophageal reflux disease with esophagitis, without bleeding: Secondary | ICD-10-CM

## 2015-11-12 DIAGNOSIS — K50012 Crohn's disease of small intestine with intestinal obstruction: Secondary | ICD-10-CM

## 2015-11-12 DIAGNOSIS — E43 Unspecified severe protein-calorie malnutrition: Secondary | ICD-10-CM

## 2015-11-12 DIAGNOSIS — K269 Duodenal ulcer, unspecified as acute or chronic, without hemorrhage or perforation: Secondary | ICD-10-CM | POA: Diagnosis not present

## 2015-11-12 DIAGNOSIS — E876 Hypokalemia: Secondary | ICD-10-CM | POA: Diagnosis not present

## 2015-11-12 DIAGNOSIS — K50813 Crohn's disease of both small and large intestine with fistula: Secondary | ICD-10-CM | POA: Diagnosis not present

## 2015-11-12 DIAGNOSIS — G8929 Other chronic pain: Secondary | ICD-10-CM

## 2015-11-12 NOTE — Patient Instructions (Signed)
No solid food by mouth. Need to stay on clear and full liquids.

## 2015-11-12 NOTE — Progress Notes (Signed)
Presenting complaint;  Follow-up for multiple problems.  Subjective:  David [name] is 39 year old Caucasian male was ever scheduled visit accompanied by his mother David [name]. He was last seen on 10/14/2015. He has lost 19 pounds in the last 1 month. He states he has cut back oral intake. He is trying not to eat solid meals. He is still having 2-3 episodes of vomiting per week. Last episode was yesterday. He had an episode a few days earlier when he vomited 60 ounces of food and fluid. He sleeps with head and of bed elevated. He has heartburn usually prior to vomiting spells. He denies hematemesis melena or rectal bleeding. He is not having problem at gastric fistula site. It is draining freely. He continues to complain of abdominal pain which is more involving the right half of the abdomen. He states his pain control is to his satisfaction. He is on PCA with basal rate of hydromorphone and boluses every 10 minutes or so. He states he is using 2-3 boluses per hour. He is having 1-2 formed to semi-formed stools per day. He denies melena or rectal bleeding. He states urine output remains between 8 and 900 mL every 24 hours. He has cough with clear sputum. He states his breathing has improved since TPN rate was decreased so that he is getting 4 L over. Of 18 hours. He was not able to keep his appointment with Dr. Pollyann Samples. He denies fever chills or night sweats. He states at home except when he has to go to the hospital or to see his physicians.    Current Medications: Outpatient Encounter Prescriptions as of 11/12/2015  Medication Sig  . cloNIDine (CATAPRES - DOSED IN MG/24 HR) 0.1 mg/24hr patch Place 0.1 mg onto the skin once a week.   Marland Kitchen dextrose 5 % solution   . dicyclomine (BENTYL) 10 MG capsule Take 1 capsule (10 mg total) by mouth 3 (three) times daily as needed (stomach cramps).  . ferrous gluconate (FERGON) 324 MG tablet Take 324 mg by mouth 3 (three) times daily.  . fish oil-omega-3 fatty acids 1000 MG  capsule Take 1 g by mouth 3 (three) times daily.  Marland Kitchen HYDROmorphone (DILAUDID) 500 MG/50ML SOLN injection 500 mg. Patient states that he pushes the button ever 10 minutes.  . metolazone (ZAROXOLYN) 5 MG tablet Take 1 tablet (5 mg total) by mouth 2 (two) times daily.  Marland Kitchen nystatin cream (MYCOSTATIN) Apply 1 application topically 3 (three) times daily as needed for dry skin. Use as directed  . pantoprazole (PROTONIX) 40 MG tablet Take 1 tablet (40 mg total) by mouth 2 (two) times daily.  Marland Kitchen PAXIL 10 MG/5ML suspension Take 10 mg by mouth daily.   . pentamidine (PENTAM) 300 MG inhalation solution Inhale 300 mg into the lungs every 28 (twenty-eight) days.  . polyethylene glycol (MIRALAX / GLYCOLAX) packet Take 17 g by mouth daily as needed for mild constipation or moderate constipation.   . potassium chloride 20 MEQ/15ML (10%) SOLN Take 30 mLs (40 mEq total) by mouth 3 (three) times daily.  . Probiotic Product (ALIGN) 4 MG CAPS Take 4 mg by mouth daily.   . promethazine (PHENERGAN) 25 MG tablet Take 1 tablet (25 mg total) by mouth 2 (two) times daily as needed. nausea  . propranolol (INDERAL) 10 MG tablet Take 10 mg by mouth 2 (two) times daily.   . sodium chloride 0.9 % infusion as needed.   . sucralfate (CARAFATE) 1 G tablet Take 1 g by mouth 3 (  three) times daily before meals.  . calcium-vitamin D (OSCAL 500/200 D-3) 500-200 MG-UNIT per tablet Take 1 tablet by mouth 2 (two) times daily. (Patient not taking: Reported on 10/14/2015)  . furosemide (LASIX) 40 MG tablet Take 1 tablet (40 mg total) by mouth daily as needed. (Patient not taking: Reported on 10/14/2015)  . Gabapentin 300 MG/6ML SOLN Take 12 mLs by mouth 3 (three) times daily.  . piperacillin-tazobactam (ZOSYN) 3.375 (3-0.375) G injection Inject 3.375 g into the muscle.   . spironolactone (ALDACTONE) 25 MG tablet Take 1 tablet (25 mg total) by mouth 2 (two) times daily. (Patient not taking: Reported on 10/14/2015)   No facility-administered  encounter medications on file as of 11/12/2015.     Objective: Blood pressure 100/70, pulse 112, temperature 98.9 F (37.2 C), temperature source Oral, resp. rate 18, height 5\' 11"  (1.803 m), weight 143 lb 6.4 oz (65.046 kg). Patient is alert and in no acute distress. He appears chronically ill and older than stated age. He has generalized muscle wasting. Conjunctiva is pink. Sclera is nonicteric Oropharyngeal mucosa is normal. No neck masses or thyromegaly noted. Cardiac exam with regular rhythm normal S1 and S2. No murmur or gallop noted. Auscultation of lungs revealed crackles in right base. Abdomen. Colostomy bag in place covering gastric fistula. Bilious fluid and pieces of food debris noted in colostomy bag. He has generalized tenderness which is more pronounced in right lower and upper quadrant. Abdomen is soft and the left side and from on the right. He has 1+ pitting edema around his ankles.  Labs/studies Results: Lab data from 11/11/2015  WBC 10.9 H&H is 9.7 and 28.0 and platelet count 103K.  Glucose 101 BUN 37 creatinine 1.36. Serum sodium 136 potassium 3.1 chloride 82 CO2 39 serum calcium 8.3. Total protein 7.5 and albumin of 2.6. There are been 1.3 AP 349 AST 42 and ALT 20 serum phosphorus 4.0 and magnesium 1.8.    Assessment:  #1. Crohn's disease with small bowel obstruction. He has been on TPN for almost a year. He is in need of small bowel transplant for which she was listed at [facility] and then delisted because of pneumonia and septic arthritis. He needs to be reevaluated in real estate if possible otherwise his prognosis is poor. #2. Advanced cirrhosis. He is a candidate for liver transplant as well. He was listed for combined liver and small bowel transplant at [facility] but delisted as above. 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 #3. Nausea and vomiting. He is suspected to have gastroparesis or gastric stasis. Oral intake  needs to be minimal if not 0. He is at risk for aspiration and pneumonia again. #4. Severe malnutrition secondary to chronic illness. #5. Hypokalemia. Serum potassium actually has improved with adjustments in TPN. He must be losing  lot of potassium while gastric fistula. #6. Chronic abdominal pain. He is dependent on narcotics. He remains on Dilaudid PCA. He was begun on PCA under supervision of pain specialists and he remains on the same dose. #7. Anemia of chronic disease. He has received 2 units of PRBCs since his last office visit.  Plan:  Will discuss with patient's TPN pharmacist about adjusting KCl dose in TPN. I talked with Dr. Pollyann Samples of [facility] and requested office visit to reconsider placing him on waiting list for combined liver and small bowel transplant. He told me that he will try to see patient along with other specialists within 3 weeks. Office visit  in one month.

## 2015-11-13 DIAGNOSIS — J9600 Acute respiratory failure, unspecified whether with hypoxia or hypercapnia: Secondary | ICD-10-CM | POA: Diagnosis not present

## 2015-11-13 DIAGNOSIS — K50813 Crohn's disease of both small and large intestine with fistula: Secondary | ICD-10-CM | POA: Diagnosis not present

## 2015-11-13 DIAGNOSIS — K769 Liver disease, unspecified: Secondary | ICD-10-CM | POA: Diagnosis not present

## 2015-11-13 DIAGNOSIS — K269 Duodenal ulcer, unspecified as acute or chronic, without hemorrhage or perforation: Secondary | ICD-10-CM | POA: Diagnosis not present

## 2015-11-13 DIAGNOSIS — I10 Essential (primary) hypertension: Secondary | ICD-10-CM | POA: Diagnosis not present

## 2015-11-14 DIAGNOSIS — I10 Essential (primary) hypertension: Secondary | ICD-10-CM | POA: Diagnosis not present

## 2015-11-14 DIAGNOSIS — K269 Duodenal ulcer, unspecified as acute or chronic, without hemorrhage or perforation: Secondary | ICD-10-CM | POA: Diagnosis not present

## 2015-11-14 DIAGNOSIS — K769 Liver disease, unspecified: Secondary | ICD-10-CM | POA: Diagnosis not present

## 2015-11-14 DIAGNOSIS — J9600 Acute respiratory failure, unspecified whether with hypoxia or hypercapnia: Secondary | ICD-10-CM | POA: Diagnosis not present

## 2015-11-14 DIAGNOSIS — K50813 Crohn's disease of both small and large intestine with fistula: Secondary | ICD-10-CM | POA: Diagnosis not present

## 2015-11-15 DIAGNOSIS — I10 Essential (primary) hypertension: Secondary | ICD-10-CM | POA: Diagnosis not present

## 2015-11-15 DIAGNOSIS — K269 Duodenal ulcer, unspecified as acute or chronic, without hemorrhage or perforation: Secondary | ICD-10-CM | POA: Diagnosis not present

## 2015-11-15 DIAGNOSIS — K769 Liver disease, unspecified: Secondary | ICD-10-CM | POA: Diagnosis not present

## 2015-11-15 DIAGNOSIS — K50813 Crohn's disease of both small and large intestine with fistula: Secondary | ICD-10-CM | POA: Diagnosis not present

## 2015-11-15 DIAGNOSIS — J9600 Acute respiratory failure, unspecified whether with hypoxia or hypercapnia: Secondary | ICD-10-CM | POA: Diagnosis not present

## 2015-11-16 DIAGNOSIS — J9600 Acute respiratory failure, unspecified whether with hypoxia or hypercapnia: Secondary | ICD-10-CM | POA: Diagnosis not present

## 2015-11-16 DIAGNOSIS — I10 Essential (primary) hypertension: Secondary | ICD-10-CM | POA: Diagnosis not present

## 2015-11-16 DIAGNOSIS — K769 Liver disease, unspecified: Secondary | ICD-10-CM | POA: Diagnosis not present

## 2015-11-16 DIAGNOSIS — K50813 Crohn's disease of both small and large intestine with fistula: Secondary | ICD-10-CM | POA: Diagnosis not present

## 2015-11-16 DIAGNOSIS — K269 Duodenal ulcer, unspecified as acute or chronic, without hemorrhage or perforation: Secondary | ICD-10-CM | POA: Diagnosis not present

## 2015-11-18 ENCOUNTER — Encounter (INDEPENDENT_AMBULATORY_CARE_PROVIDER_SITE_OTHER): Payer: Self-pay

## 2015-11-18 DIAGNOSIS — Z5181 Encounter for therapeutic drug level monitoring: Secondary | ICD-10-CM | POA: Diagnosis not present

## 2015-11-18 DIAGNOSIS — Z452 Encounter for adjustment and management of vascular access device: Secondary | ICD-10-CM | POA: Diagnosis not present

## 2015-11-18 DIAGNOSIS — E43 Unspecified severe protein-calorie malnutrition: Secondary | ICD-10-CM | POA: Diagnosis not present

## 2015-11-18 DIAGNOSIS — K3184 Gastroparesis: Secondary | ICD-10-CM | POA: Diagnosis not present

## 2015-11-18 DIAGNOSIS — R7881 Bacteremia: Secondary | ICD-10-CM | POA: Diagnosis not present

## 2015-11-21 ENCOUNTER — Encounter (INDEPENDENT_AMBULATORY_CARE_PROVIDER_SITE_OTHER): Payer: Self-pay

## 2015-11-23 DIAGNOSIS — J9600 Acute respiratory failure, unspecified whether with hypoxia or hypercapnia: Secondary | ICD-10-CM | POA: Diagnosis not present

## 2015-11-23 DIAGNOSIS — K769 Liver disease, unspecified: Secondary | ICD-10-CM | POA: Diagnosis not present

## 2015-11-23 DIAGNOSIS — I10 Essential (primary) hypertension: Secondary | ICD-10-CM | POA: Diagnosis not present

## 2015-11-23 DIAGNOSIS — K50813 Crohn's disease of both small and large intestine with fistula: Secondary | ICD-10-CM | POA: Diagnosis not present

## 2015-11-23 DIAGNOSIS — K269 Duodenal ulcer, unspecified as acute or chronic, without hemorrhage or perforation: Secondary | ICD-10-CM | POA: Diagnosis not present

## 2015-11-25 ENCOUNTER — Inpatient Hospital Stay (HOSPITAL_COMMUNITY)
Admission: EM | Admit: 2015-11-25 | Discharge: 2015-12-15 | DRG: 871 | Disposition: E | Payer: Medicare Other | Attending: Internal Medicine | Admitting: Internal Medicine

## 2015-11-25 ENCOUNTER — Emergency Department (HOSPITAL_COMMUNITY): Payer: Medicare Other

## 2015-11-25 ENCOUNTER — Encounter (HOSPITAL_COMMUNITY): Payer: Self-pay | Admitting: Emergency Medicine

## 2015-11-25 DIAGNOSIS — K7460 Unspecified cirrhosis of liver: Secondary | ICD-10-CM | POA: Diagnosis not present

## 2015-11-25 DIAGNOSIS — R109 Unspecified abdominal pain: Secondary | ICD-10-CM | POA: Diagnosis not present

## 2015-11-25 DIAGNOSIS — Z931 Gastrostomy status: Secondary | ICD-10-CM | POA: Diagnosis not present

## 2015-11-25 DIAGNOSIS — K760 Fatty (change of) liver, not elsewhere classified: Secondary | ICD-10-CM | POA: Diagnosis present

## 2015-11-25 DIAGNOSIS — F17290 Nicotine dependence, other tobacco product, uncomplicated: Secondary | ICD-10-CM | POA: Diagnosis present

## 2015-11-25 DIAGNOSIS — Z6821 Body mass index (BMI) 21.0-21.9, adult: Secondary | ICD-10-CM

## 2015-11-25 DIAGNOSIS — K7400 Hepatic fibrosis, unspecified: Secondary | ICD-10-CM | POA: Diagnosis present

## 2015-11-25 DIAGNOSIS — A419 Sepsis, unspecified organism: Secondary | ICD-10-CM | POA: Diagnosis not present

## 2015-11-25 DIAGNOSIS — Z833 Family history of diabetes mellitus: Secondary | ICD-10-CM

## 2015-11-25 DIAGNOSIS — R627 Adult failure to thrive: Secondary | ICD-10-CM | POA: Diagnosis present

## 2015-11-25 DIAGNOSIS — N179 Acute kidney failure, unspecified: Secondary | ICD-10-CM | POA: Diagnosis present

## 2015-11-25 DIAGNOSIS — K7469 Other cirrhosis of liver: Secondary | ICD-10-CM | POA: Diagnosis not present

## 2015-11-25 DIAGNOSIS — Z8711 Personal history of peptic ulcer disease: Secondary | ICD-10-CM | POA: Diagnosis not present

## 2015-11-25 DIAGNOSIS — K50813 Crohn's disease of both small and large intestine with fistula: Secondary | ICD-10-CM

## 2015-11-25 DIAGNOSIS — G92 Toxic encephalopathy: Secondary | ICD-10-CM | POA: Diagnosis not present

## 2015-11-25 DIAGNOSIS — K3184 Gastroparesis: Secondary | ICD-10-CM | POA: Diagnosis not present

## 2015-11-25 DIAGNOSIS — K219 Gastro-esophageal reflux disease without esophagitis: Secondary | ICD-10-CM | POA: Diagnosis not present

## 2015-11-25 DIAGNOSIS — R402421 Glasgow coma scale score 9-12, in the field [EMT or ambulance]: Secondary | ICD-10-CM | POA: Diagnosis not present

## 2015-11-25 DIAGNOSIS — Z7189 Other specified counseling: Secondary | ICD-10-CM | POA: Insufficient documentation

## 2015-11-25 DIAGNOSIS — L8990 Pressure ulcer of unspecified site, unspecified stage: Secondary | ICD-10-CM | POA: Diagnosis present

## 2015-11-25 DIAGNOSIS — Z8701 Personal history of pneumonia (recurrent): Secondary | ICD-10-CM

## 2015-11-25 DIAGNOSIS — Z515 Encounter for palliative care: Secondary | ICD-10-CM | POA: Diagnosis not present

## 2015-11-25 DIAGNOSIS — R6520 Severe sepsis without septic shock: Secondary | ICD-10-CM | POA: Diagnosis not present

## 2015-11-25 DIAGNOSIS — D638 Anemia in other chronic diseases classified elsewhere: Secondary | ICD-10-CM | POA: Diagnosis not present

## 2015-11-25 DIAGNOSIS — R4020 Unspecified coma: Secondary | ICD-10-CM | POA: Diagnosis present

## 2015-11-25 DIAGNOSIS — R569 Unspecified convulsions: Secondary | ICD-10-CM | POA: Diagnosis present

## 2015-11-25 DIAGNOSIS — J189 Pneumonia, unspecified organism: Secondary | ICD-10-CM | POA: Diagnosis not present

## 2015-11-25 DIAGNOSIS — N390 Urinary tract infection, site not specified: Secondary | ICD-10-CM | POA: Diagnosis not present

## 2015-11-25 DIAGNOSIS — Z66 Do not resuscitate: Secondary | ICD-10-CM | POA: Diagnosis not present

## 2015-11-25 DIAGNOSIS — E43 Unspecified severe protein-calorie malnutrition: Secondary | ICD-10-CM | POA: Diagnosis present

## 2015-11-25 DIAGNOSIS — F4489 Other dissociative and conversion disorders: Secondary | ICD-10-CM | POA: Diagnosis not present

## 2015-11-25 DIAGNOSIS — A414 Sepsis due to anaerobes: Secondary | ICD-10-CM

## 2015-11-25 DIAGNOSIS — D500 Iron deficiency anemia secondary to blood loss (chronic): Secondary | ICD-10-CM | POA: Diagnosis present

## 2015-11-25 DIAGNOSIS — A4159 Other Gram-negative sepsis: Principal | ICD-10-CM | POA: Diagnosis present

## 2015-11-25 DIAGNOSIS — I10 Essential (primary) hypertension: Secondary | ICD-10-CM | POA: Diagnosis present

## 2015-11-25 DIAGNOSIS — K5080 Crohn's disease of both small and large intestine without complications: Secondary | ICD-10-CM | POA: Diagnosis present

## 2015-11-25 DIAGNOSIS — R55 Syncope and collapse: Secondary | ICD-10-CM | POA: Diagnosis not present

## 2015-11-25 DIAGNOSIS — D696 Thrombocytopenia, unspecified: Secondary | ICD-10-CM | POA: Diagnosis not present

## 2015-11-25 DIAGNOSIS — R4182 Altered mental status, unspecified: Secondary | ICD-10-CM | POA: Diagnosis not present

## 2015-11-25 DIAGNOSIS — Z9049 Acquired absence of other specified parts of digestive tract: Secondary | ICD-10-CM | POA: Diagnosis not present

## 2015-11-25 DIAGNOSIS — D649 Anemia, unspecified: Secondary | ICD-10-CM

## 2015-11-25 DIAGNOSIS — E860 Dehydration: Secondary | ICD-10-CM | POA: Diagnosis not present

## 2015-11-25 DIAGNOSIS — R7881 Bacteremia: Secondary | ICD-10-CM | POA: Diagnosis not present

## 2015-11-25 LAB — COMPREHENSIVE METABOLIC PANEL
ALBUMIN: 2 g/dL — AB (ref 3.5–5.0)
ALK PHOS: 177 U/L — AB (ref 38–126)
ALT: 20 U/L (ref 17–63)
ANION GAP: 11 (ref 5–15)
AST: 44 U/L — ABNORMAL HIGH (ref 15–41)
BUN: 110 mg/dL — ABNORMAL HIGH (ref 6–20)
CALCIUM: 7.1 mg/dL — AB (ref 8.9–10.3)
CHLORIDE: 91 mmol/L — AB (ref 101–111)
CO2: 30 mmol/L (ref 22–32)
Creatinine, Ser: 4.06 mg/dL — ABNORMAL HIGH (ref 0.61–1.24)
GFR calc Af Amer: 20 mL/min — ABNORMAL LOW (ref >60)
GFR calc non Af Amer: 17 mL/min — ABNORMAL LOW (ref >60)
GLUCOSE: 111 mg/dL — AB (ref 65–99)
Potassium: 3.5 mmol/L (ref 3.5–5.1)
SODIUM: 132 mmol/L — AB (ref 135–145)
Total Bilirubin: 1.7 mg/dL — ABNORMAL HIGH (ref 0.3–1.2)
Total Protein: 6.4 g/dL — ABNORMAL LOW (ref 6.5–8.1)

## 2015-11-25 LAB — CBC WITH DIFFERENTIAL/PLATELET
BASOS PCT: 0 %
Basophils Absolute: 0 10*3/uL (ref 0.0–0.1)
Eosinophils Absolute: 0.1 10*3/uL (ref 0.0–0.7)
Eosinophils Relative: 1 %
HEMATOCRIT: 23.2 % — AB (ref 39.0–52.0)
HEMOGLOBIN: 8.1 g/dL — AB (ref 13.0–17.0)
LYMPHS ABS: 1.3 10*3/uL (ref 0.7–4.0)
Lymphocytes Relative: 9 %
MCH: 30.9 pg (ref 26.0–34.0)
MCHC: 34.9 g/dL (ref 30.0–36.0)
MCV: 88.5 fL (ref 78.0–100.0)
MONO ABS: 0.9 10*3/uL (ref 0.1–1.0)
MONOS PCT: 6 %
NEUTROS ABS: 12.3 10*3/uL — AB (ref 1.7–7.7)
Neutrophils Relative %: 84 %
Platelets: 39 10*3/uL — ABNORMAL LOW (ref 150–400)
RBC: 2.62 MIL/uL — ABNORMAL LOW (ref 4.22–5.81)
RDW: 17.4 % — ABNORMAL HIGH (ref 11.5–15.5)
WBC Morphology: INCREASED
WBC: 14.6 10*3/uL — ABNORMAL HIGH (ref 4.0–10.5)

## 2015-11-25 LAB — URINALYSIS, ROUTINE W REFLEX MICROSCOPIC
Glucose, UA: NEGATIVE mg/dL
Ketones, ur: NEGATIVE mg/dL
NITRITE: NEGATIVE
PH: 6.5 (ref 5.0–8.0)
Protein, ur: 100 mg/dL — AB
SPECIFIC GRAVITY, URINE: 1.01 (ref 1.005–1.030)

## 2015-11-25 LAB — I-STAT CG4 LACTIC ACID, ED: LACTIC ACID, VENOUS: 2.4 mmol/L — AB (ref 0.5–2.0)

## 2015-11-25 LAB — PHOSPHORUS: PHOSPHORUS: 2.4 mg/dL — AB (ref 2.5–4.6)

## 2015-11-25 LAB — URINE MICROSCOPIC-ADD ON: Squamous Epithelial / LPF: NONE SEEN

## 2015-11-25 LAB — MRSA PCR SCREENING: MRSA by PCR: POSITIVE — AB

## 2015-11-25 LAB — MAGNESIUM: Magnesium: 2.1 mg/dL (ref 1.7–2.4)

## 2015-11-25 LAB — PREPARE RBC (CROSSMATCH)

## 2015-11-25 LAB — CK: CK TOTAL: 174 U/L (ref 49–397)

## 2015-11-25 MED ORDER — SODIUM CHLORIDE 0.9 % IV BOLUS (SEPSIS)
30.0000 mL/kg | Freq: Once | INTRAVENOUS | Status: AC
  Administered 2015-11-25: 1947 mL via INTRAVENOUS

## 2015-11-25 MED ORDER — SODIUM CHLORIDE 0.9 % IV SOLN: INTRAVENOUS | Status: DC

## 2015-11-25 MED ORDER — VANCOMYCIN HCL 10 G IV SOLR
1250.0000 mg | Freq: Once | INTRAVENOUS | Status: AC
  Administered 2015-11-25: 1250 mg via INTRAVENOUS
  Filled 2015-11-25: qty 1250

## 2015-11-25 MED ORDER — SODIUM CHLORIDE 0.9 % IJ SOLN
3.0000 mL | Freq: Two times a day (BID) | INTRAMUSCULAR | Status: DC
  Administered 2015-11-25: 3 mL via INTRAVENOUS

## 2015-11-25 MED ORDER — SODIUM CHLORIDE 0.9 % IV SOLN: Freq: Once | INTRAVENOUS | Status: AC

## 2015-11-25 MED ORDER — ONDANSETRON HCL 4 MG/2ML IJ SOLN: 4.0000 mg | Freq: Three times a day (TID) | INTRAMUSCULAR | Status: AC | PRN

## 2015-11-25 MED ORDER — PANTOPRAZOLE SODIUM 40 MG IV SOLR
40.0000 mg | Freq: Two times a day (BID) | INTRAVENOUS | Status: DC
  Administered 2015-11-25: 40 mg via INTRAVENOUS
  Filled 2015-11-25: qty 40

## 2015-11-25 MED ORDER — HYDROMORPHONE HCL 1 MG/ML IJ SOLN
0.5000 mg | INTRAMUSCULAR | Status: DC | PRN
  Administered 2015-11-25: 0.5 mg via INTRAVENOUS
  Filled 2015-11-25 (×2): qty 1

## 2015-11-25 MED ORDER — ACETAMINOPHEN 10 MG/ML IV SOLN
1000.0000 mg | Freq: Once | INTRAVENOUS | Status: AC
  Administered 2015-11-25: 1000 mg via INTRAVENOUS
  Filled 2015-11-25: qty 100

## 2015-11-25 MED ORDER — SODIUM CHLORIDE 0.9 % IV BOLUS (SEPSIS)
1000.0000 mL | Freq: Once | INTRAVENOUS | Status: AC
  Administered 2015-11-25: 1000 mL via INTRAVENOUS

## 2015-11-25 MED ORDER — VANCOMYCIN HCL IN DEXTROSE 1-5 GM/200ML-% IV SOLN: 1000.0000 mg | INTRAVENOUS | Status: DC

## 2015-11-25 MED ORDER — ACETAMINOPHEN 325 MG PO TABS: 650.0000 mg | ORAL_TABLET | Freq: Four times a day (QID) | ORAL | Status: DC | PRN

## 2015-11-25 MED ORDER — ACETAMINOPHEN 10 MG/ML IV SOLN: 1000.0000 mg | Freq: Four times a day (QID) | INTRAVENOUS | Status: DC

## 2015-11-25 MED ORDER — HYDROMORPHONE HCL 1 MG/ML IJ SOLN: 0.5000 mg | Freq: Two times a day (BID) | INTRAMUSCULAR | Status: DC | PRN

## 2015-11-25 MED ORDER — SODIUM CHLORIDE 0.9 % IV SOLN
500.0000 mg | Freq: Two times a day (BID) | INTRAVENOUS | Status: DC
  Administered 2015-11-25: 500 mg via INTRAVENOUS
  Filled 2015-11-25 (×3): qty 5

## 2015-11-25 MED ORDER — LORAZEPAM 2 MG/ML IJ SOLN: 1.0000 mg | Freq: Once | INTRAMUSCULAR | Status: AC

## 2015-11-25 MED ORDER — SODIUM CHLORIDE 0.9 % IV SOLN
INTRAVENOUS | Status: DC
  Administered 2015-11-25: 15:00:00 via INTRAVENOUS

## 2015-11-25 MED ORDER — LORAZEPAM 2 MG/ML IJ SOLN
1.0000 mg | INTRAMUSCULAR | Status: DC | PRN
  Administered 2015-11-25 (×2): 1 mg via INTRAVENOUS
  Filled 2015-11-25 (×2): qty 1

## 2015-11-25 MED ORDER — DEXTROSE 5 % IV SOLN
1.0000 g | Freq: Once | INTRAVENOUS | Status: AC
  Administered 2015-11-25: 1 g via INTRAVENOUS
  Filled 2015-11-25: qty 10

## 2015-11-25 MED ORDER — LORAZEPAM 2 MG/ML IJ SOLN
1.0000 mg | INTRAMUSCULAR | Status: AC
  Administered 2015-11-25: 1 mg via INTRAVENOUS
  Filled 2015-11-25: qty 1

## 2015-11-25 MED ORDER — HYDROMORPHONE HCL 1 MG/ML IJ SOLN
0.5000 mg | INTRAMUSCULAR | Status: DC | PRN
  Administered 2015-11-25: 0.5 mg via INTRAVENOUS
  Filled 2015-11-25: qty 1

## 2015-11-25 MED ORDER — CHLORHEXIDINE GLUCONATE 0.12 % MT SOLN: 15.0000 mL | Freq: Two times a day (BID) | OROMUCOSAL | Status: DC

## 2015-11-25 MED ORDER — PIPERACILLIN SOD-TAZOBACTAM SO 2.25 (2-0.25) G IV SOLR
2.2500 g | Freq: Four times a day (QID) | INTRAVENOUS | Status: DC
  Administered 2015-11-25: 2.25 g via INTRAVENOUS
  Filled 2015-11-25 (×8): qty 2.25

## 2015-11-25 MED ORDER — PIPERACILLIN-TAZOBACTAM 3.375 G IVPB 30 MIN
3.3750 g | Freq: Once | INTRAVENOUS | Status: AC
Start: 2015-11-25 — End: 2015-11-25
  Administered 2015-11-25: 3.375 g via INTRAVENOUS
  Filled 2015-11-25: qty 50

## 2015-11-25 MED ORDER — ACETAMINOPHEN 10 MG/ML IV SOLN
INTRAVENOUS | Status: AC
  Filled 2015-11-25: qty 100

## 2015-11-25 MED ORDER — CETYLPYRIDINIUM CHLORIDE 0.05 % MT LIQD
7.0000 mL | Freq: Two times a day (BID) | OROMUCOSAL | Status: DC
  Administered 2015-11-25: 7 mL via OROMUCOSAL

## 2015-11-25 NOTE — ED Provider Notes (Signed)
CSN: [id number]     Arrival date & time 12/01/2015  1020 History  By signing my name below, I, David [name], attest that this documentation has been prepared under the direction and in the presence of David Hong, MD.  Electronically Signed: Gwenyth [name], ED Scribe. Nov 26, 2015. 10:36 AM.   Chief Complaint  Patient presents with  . Altered Mental Status   LEVEL 5 CAVEAT: ALTERED MENTAL STATUS  The history is provided by the EMS personnel. No language interpreter was used.    HPI Comments: David [name] is a 39 y.o. male with a history of Crohn's Disease, cirrhosis and gastroparesis who presents to the Emergency Department after an unwitnessed fall at home. Per EMS, pt was at baseline for communication yesterday and took his pain pump off (usually takes dilaudid q 15 minutes). He was found this morning on the floor with decreased consciousness and apparent right leg pain, which was propped by a pillow. The mechanism of the fall was unclear. Pt does not appear ambulatory and, per EMS, there were no apparent walkers or wheelchairs in the vicinity of the fall. Level 5 caveat applies - pt is non verbal.  After review of the 'gi notes from the last 2 months - there has been    Past Medical History  Diagnosis Date  . 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 Crohn's disease with fistula (HCC) 05/04/2011    both large and small intestinges/notes 11/15/2012  . Hepatomegaly 05/04/2011  . Fatty liver 05/04/2011    "stage III fatty liver fibrosis" (11/15/2012)  . GERD (gastroesophageal reflux disease)   . Chronic liver disease     /notes 11/15/2012  . Pericarditis     [name] 11/15/2012  . Hypertension   . Pneumonia 1977  . Shortness of breath     "all the time right now" (11/15/2012)  . History of blood transfusion 2004  . History of stomach ulcers   . Duodenal ulcer   . Depression   . Kidney stones     bilaterally/notes 11/15/2012  . Hepatic fibrosis (HCC)     [name] 11/15/2012  . Anemia   .  Pericardial effusion 10/29/2012    moderate to large/notes 11/15/2012  . Anxiety   . Hepatitis   . Crohn's disease (HCC)   . ED (erectile dysfunction)   . Cirrhosis (HCC)     secondary to drug effect, +/- fatty liver   Past Surgical History  Procedure Laterality Date  . Anal examination under anesthesia  02/11/2011    [name]  . Treatment fistula anal  07/03/11    This was a second surgery to repair Anal fistula  . Gastrojejunostomy  2004    "had hole cut in small intestines during endoscopy; had to have OR & leave me open 3 months" (11/15/2012)  . Cholecystectomy  ~ 2003  . Appendectomy  ~ 2003  . Video assisted thoracoscopy  11/17/2012    Procedure: VIDEO ASSISTED THORACOSCOPY;  Surgeon: Loreli Slot, MD;  Location: [facility] OR;  Service: Thoracic;  Laterality: Left;  drainage of left pleural effusion  . Pericardial window  11/17/2012    Procedure: PERICARDIAL WINDOW;  Surgeon: Loreli Slot, MD;  Location: [facility] OR;  Service: Thoracic;  Laterality: N/A;  . Esophagogastroduodenoscopy  01/06/2013    Procedure: ESOPHAGOGASTRODUODENOSCOPY (EGD);  Surgeon: Malissa Hippo, MD;  Location: [facility] END[facility] SUITE;  Service: Endoscopy;  Laterality: N/A;  11:15  . Esophagogastroduodenoscopy N/A 03/09/2014    Dr. Karilyn Cota: erosive/ulcerative reflux esophagiits, portal gastropathy, moderate  amount of bile and food debris in stomach precluding visualization of GJ anastomosis  . Gastrostomy tube placement  11/19/14    [facility]: 74 F APDL catheter. Checked again 12/23 via fluoroscopy and in suitable position  . Colonoscopy  Aug 2015    Dr. Edyth Gunnels at [facility]: congested mucosa in entire colon, solitary ulcer distal ileum, stricture in ileum 5 cm from IC valve s/p dilation. Surveillance in 1 year  . Exploratory laparotomy  Nov 05, 2014    [facility], Dr. Byrd Hesselbach. Several hour operation with unsuccessful lysis of adhesions due to frozen abdomen  . Picc line    . Tee without cardioversion N/A 01/28/2015     Procedure: TRANSESOPHAGEAL ECHOCARDIOGRAM (TEE);  Surgeon: Thurmon Fair, MD;  Location: [facility] ENDOSCOPY;  Service: Cardiovascular;  Laterality: N/A;  . Esophagogastroduodenoscopy (egd) with propofol N/A 10/04/2015    Procedure: ESOPHAGOGASTRODUODENOSCOPY (EGD) WITH PROPOFOL;  Surgeon: Malissa Hippo, MD;  Location: [facility] ORS;  Service: Endoscopy;  Laterality: N/A;  855  . Peg placement N/A 10/04/2015    Procedure: PERCUTANEOUS ENDOSCOPIC GASTROSTOMY (PEG) PLACEMENT;  Surgeon: Malissa Hippo, MD;  Location: [facility] ORS;  Service: Endoscopy;  Laterality: N/A;   Family History  Problem Relation Age of Onset  . Diabetes Mother   . Diabetes Brother   . Healthy Daughter   . Healthy Son   . Colon cancer Neg Hx    Social History  Substance Use Topics  . Smoking status: Former Smoker -- 0.50 packs/day for 24 years    Types: Cigarettes    Start date: 10/28/2012    Quit date: 11/22/2012  . Smokeless tobacco: Current User    Types: Snuff     Comment: 1/2 pack a month  . Alcohol Use: No    Review of Systems  Unable to perform ROS: Mental status change      Allergies  Remicade; Fentanyl and related; Alfentanil; Wellbutrin; and Morphine and related  Home Medications   Prior to Admission medications   Medication Sig Start Date End Date Taking? Authorizing Provider  calcium-vitamin D (OSCAL 500/200 D-3) 500-200 MG-UNIT per tablet Take 1 tablet by mouth 2 (two) times daily. Patient not taking: Reported on 10/14/2015 11/27/14   Malissa Hippo, MD  cloNIDine (CATAPRES - DOSED IN MG/24 HR) 0.1 mg/24hr patch Place 0.1 mg onto the skin once a week.  09/17/15   Historical Provider, MD  dextrose 5 % solution  10/10/15   Historical Provider, MD  dicyclomine (BENTYL) 10 MG capsule Take 1 capsule (10 mg total) by mouth 3 (three) times daily as needed (stomach cramps). 06/07/15   Len Blalock, NP  ferrous gluconate (FERGON) 324 MG tablet Take 324 mg by mouth 3 (three) times daily. 11/21/14   Historical  Provider, MD  fish oil-omega-3 fatty acids 1000 MG capsule Take 1 g by mouth 3 (three) times daily. 02/09/12   Malissa Hippo, MD  furosemide (LASIX) 40 MG tablet Take 1 tablet (40 mg total) by mouth daily as needed. Patient not taking: Reported on 10/14/2015 08/12/15   Malissa Hippo, MD  Gabapentin 300 MG/6ML SOLN Take 12 mLs by mouth 3 (three) times daily. 09/17/15 10/17/15  Historical Provider, MD  HYDROmorphone (DILAUDID) 500 MG/50ML SOLN injection 500 mg. Patient states that he pushes the button ever 10 minutes. 09/23/15   Historical Provider, MD  metolazone (ZAROXOLYN) 5 MG tablet Take 1 tablet (5 mg total) by mouth 2 (two) times daily. 01/01/15   Erick Blinks, MD  nystatin cream (  MYCOSTATIN) Apply 1 application topically 3 (three) times daily as needed for dry skin. Use as directed 04/30/15   Malissa Hippo, MD  pantoprazole (PROTONIX) 40 MG tablet Take 1 tablet (40 mg total) by mouth 2 (two) times daily. 06/18/14   Malissa Hippo, MD  PAXIL 10 MG/5ML suspension Take 10 mg by mouth daily.  07/10/15   Historical Provider, MD  pentamidine (PENTAM) 300 MG inhalation solution Inhale 300 mg into the lungs every 28 (twenty-eight) days. 09/28/15   Historical Provider, MD  polyethylene glycol (MIRALAX / GLYCOLAX) packet Take 17 g by mouth daily as needed for mild constipation or moderate constipation.  10/26/14   Historical Provider, MD  potassium chloride 20 MEQ/15ML (10%) SOLN Take 30 mLs (40 mEq total) by mouth 3 (three) times daily. 01/01/15   Erick Blinks, MD  Probiotic Product (ALIGN) 4 MG CAPS Take 4 mg by mouth daily.     Historical Provider, MD  promethazine (PHENERGAN) 25 MG tablet Take 1 tablet (25 mg total) by mouth 2 (two) times daily as needed. nausea 11/27/14   Malissa Hippo, MD  propranolol (INDERAL) 10 MG tablet Take 10 mg by mouth 2 (two) times daily.  08/06/15   Historical Provider, MD  sodium chloride 0.9 % infusion as needed.  08/07/15   Historical Provider, MD  spironolactone  (ALDACTONE) 25 MG tablet Take 1 tablet (25 mg total) by mouth 2 (two) times daily. Patient not taking: Reported on 10/14/2015 01/01/15   Erick Blinks, MD  sucralfate (CARAFATE) 1 G tablet Take 1 g by mouth 3 (three) times daily before meals.    Historical Provider, MD   BP 94/73 mmHg  Pulse 106  Temp(Src) 98 F (36.7 C) (Oral)  Resp 18  Ht 5\' 7"  (1.702 m)  Wt 143 lb (64.864 kg)  BMI 22.39 kg/m2  SpO2 95% Physical Exam  Constitutional: He appears well-developed and well-nourished. No distress.  Clothes very wet Not compliant with exam  HENT:  Head: Normocephalic and atraumatic.  Mouth/Throat: Oropharynx is clear and moist. No oropharyngeal exudate.  Dried blood in nares, but no swelling over nasal bridge  Eyes: Conjunctivae and EOM are normal. Pupils are equal, round, and reactive to light. Right eye exhibits no discharge. Left eye exhibits no discharge. No scleral icterus.  Neck: Normal range of motion. Neck supple. No JVD present. No thyromegaly present.  Cardiovascular: Regular rhythm, normal heart sounds and intact distal pulses.  Tachycardia present.  Exam reveals no gallop and no friction rub.   No murmur heard. Pulmonary/Chest: Effort normal and breath sounds normal. No respiratory distress. He has no wheezes. He has no rales.  Mildly tachypneic  Abdominal: Soft. Bowel sounds are normal. He exhibits distension (Tense). He exhibits no mass. There is no tenderness.  Ostomy in epigastrium draining clear brown stomach fluid  Musculoskeletal: Normal range of motion. He exhibits no edema or tenderness.  No deformities  Lymphadenopathy:    He has no cervical adenopathy.  Neurological: He is alert. Coordination normal.  Skin: Skin is warm and dry. No rash noted. No erythema.  Psychiatric: He has a normal mood and affect. His behavior is normal.  Nursing note and vitals reviewed.   ED Course  Procedures  DIAGNOSTIC STUDIES: Oxygen Saturation is 88% on RA, low by my  interpretation.    COORDINATION OF CARE: 10:35 AM Discussed treatment plan which includes lab work and a chest x-ray.  Labs Review Labs Reviewed  COMPREHENSIVE METABOLIC PANEL - Abnormal; Notable for  the following:    Sodium 132 (*)    Chloride 91 (*)    Glucose, Bld 111 (*)    BUN 110 (*)    Creatinine, Ser 4.06 (*)    Calcium 7.1 (*)    Total Protein 6.4 (*)    Albumin 2.0 (*)    AST 44 (*)    Alkaline Phosphatase 177 (*)    Total Bilirubin 1.7 (*)    GFR calc non Af Amer 17 (*)    GFR calc Af Amer 20 (*)    All other components within normal limits  CBC WITH DIFFERENTIAL/PLATELET - Abnormal; Notable for the following:    WBC 14.6 (*)    RBC 2.62 (*)    Hemoglobin 8.1 (*)    HCT 23.2 (*)    RDW 17.4 (*)    Platelets 39 (*)    Neutro Abs 12.3 (*)    All other components within normal limits  URINALYSIS, ROUTINE W REFLEX MICROSCOPIC (NOT AT [facility]) - Abnormal; Notable for the following:    Color, Urine AMBER (*)    APPearance HAZY (*)    Hgb urine dipstick LARGE (*)    Bilirubin Urine SMALL (*)    Protein, ur 100 (*)    Leukocytes, UA MODERATE (*)    All other components within normal limits  URINE MICROSCOPIC-ADD ON - Abnormal; Notable for the following:    Bacteria, UA MANY (*)    All other components within normal limits  I-STAT CG4 LACTIC ACID, ED - Abnormal; Notable for the following:    Lactic Acid, Venous 2.40 (*)    All other components within normal limits  CULTURE, BLOOD (ROUTINE X 2)  CULTURE, BLOOD (ROUTINE X 2)  URINE CULTURE  CK    Imaging Review Ct Head Wo Contrast  12/03/2015  CLINICAL DATA:  Unwitnessed fall at home. Decreased level of consciousness. EXAM: CT HEAD WITHOUT CONTRAST CT MAXILLOFACIAL WITHOUT CONTRAST TECHNIQUE: Multidetector CT imaging of the head and maxillofacial structures were performed using the standard protocol without intravenous contrast. Multiplanar CT image reconstructions of the maxillofacial structures were also  generated. COMPARISON:  None. FINDINGS: CT HEAD FINDINGS Motion artifact present despite repeating. No acute intracranial abnormality. Specifically, no hemorrhage, hydrocephalus, mass lesion, acute infarction, or significant intracranial injury. No acute calvarial abnormality. CT MAXILLOFACIAL FINDINGS No evidence of facial or orbital fracture. Rounded mucosal thickening in the right maxillary sinus. Paranasal sinuses and mastoids otherwise clear. No air-fluid levels. Mandible and zygomatic arches intact. IMPRESSION: No acute intracranial abnormality. No evidence of facial/ orbital fracture. Electronically Signed   By: Charlett Nose M.D.   On: 11/20/2015 12:34   Dg Abd Acute W/chest  11/28/2015  CLINICAL DATA:  Abdominal pain. EXAM: DG ABDOMEN ACUTE W/ 1V CHEST COMPARISON:  Chest x-ray 10/04/2015 FINDINGS: Low lung volumes. There is mild cardiomegaly. No confluent airspace opacity. Right central line tip is at the cavoatrial junction. No effusion. Prior cholecystectomy. Nonobstructive bowel gas pattern. No free air organomegaly. No acute bony abnormality. IMPRESSION: No evidence of bowel obstruction or free air. Cardiomegaly.  Low lung volumes.  No acute cardiopulmonary disease. Electronically Signed   By: Charlett Nose M.D.   On: 11/30/2015 12:25   Ct Maxillofacial Wo Cm  12/11/2015  CLINICAL DATA:  Unwitnessed fall at home. Decreased level of consciousness. EXAM: CT HEAD WITHOUT CONTRAST CT MAXILLOFACIAL WITHOUT CONTRAST TECHNIQUE: Multidetector CT imaging of the head and maxillofacial structures were performed using the standard protocol without intravenous contrast. Multiplanar CT image  reconstructions of the maxillofacial structures were also generated. COMPARISON:  None. FINDINGS: CT HEAD FINDINGS Motion artifact present despite repeating. No acute intracranial abnormality. Specifically, no hemorrhage, hydrocephalus, mass lesion, acute infarction, or significant intracranial injury. No acute calvarial  abnormality. CT MAXILLOFACIAL FINDINGS No evidence of facial or orbital fracture. Rounded mucosal thickening in the right maxillary sinus. Paranasal sinuses and mastoids otherwise clear. No air-fluid levels. Mandible and zygomatic arches intact. IMPRESSION: No acute intracranial abnormality. No evidence of facial/ orbital fracture. Electronically Signed   By: Charlett Nose M.D.   On: 12/05/2015 12:34   I have personally reviewed and evaluated these images and lab results as part of my medical decision-making.   EKG Interpretation   Date/Time:  Monday December 05, 2015 10:46:40 EST Ventricular Rate:  109 PR Interval:  158 QRS Duration: 87 QT Interval:  313 QTC Calculation: 421 R Axis:   36 Text Interpretation:  electrical noise Normal sinus rhythm normal axis.  Nonspecific T wave abnormality Abnormal ekg Confirmed by Hyacinth Meeker  MD, Jyssica Rief  [phone]) on 05-Dec-2015 12:02:57 PM      MDM   Final diagnoses:  Sepsis, due to unspecified organism (HCC)  Acute renal failure, unspecified acute renal failure type (HCC)     The pt appears septic, has hypotension, tachycardia, tachypnea, slight hypoxia but no fever. The lab work is considerably abnormal including acute renal failure, urinary tract infection, severe leukocytosis as well as a creatinine of 4 compared to a baseline of 2. He has been given sepsis fluids, sepsis protocol antibiotics, and cardiac monitoring, continues to appear ill and has not returned to baseline mental status. I have discussed his care with the hospitalist who will admit the patient to the intensive care unit. He is critically ill with a diagnosis of sepsis, unspecified organism at this time.  CRITICAL CARE Performed by: Vida Roller Total critical care time: 35 minutes Critical care time was exclusive of separately billable procedures and treating other patients. Critical care was necessary to treat or prevent imminent or life-threatening deterioration. Critical care was  time spent personally by me on the following activities: development of treatment plan with patient and/or surrogate as well as nursing, discussions with consultants, evaluation of patient's response to treatment, examination of patient, obtaining history from patient or surrogate, ordering and performing treatments and interventions, ordering and review of laboratory studies, ordering and review of radiographic studies, pulse oximetry and re-evaluation of patient's condition.  I personally performed the services described in this documentation, which was scribed in my presence. The recorded information has been reviewed and is accurate.     Meds given in ED:  Medications  cefTRIAXone (ROCEPHIN) 1 g in dextrose 5 % 50 mL IVPB (1 g Intravenous New Bag/Given 12-05-15 1229)  sodium chloride 0.9 % bolus 1,000 mL (0 mLs Intravenous Stopped 2015-12-05 1228)  sodium chloride 0.9 % bolus 1,947 mL (1,947 mLs Intravenous New Bag/Given 12/05/2015 1130)       David Hong, MD 05-Dec-2015 1250

## 2015-11-25 NOTE — Progress Notes (Signed)
Dr. Randol Kern notified of patient change in condition. MD in to see patient and discussed agonal breathing with wife and family at bedside. Suggested comfort care but wife wishes to continue treatment. MD to make changes to patients orders regarding pain medication administration to ensure comfort.

## 2015-11-25 NOTE — ED Notes (Signed)
Per EMS pt was found on the floor by home health nurse this morning.  Pt does not respond verbally at this time.  Saturated in feces and urine.

## 2015-11-25 NOTE — H&P (Signed)
Patient Demographics  David [name], is a 39 y.o. male  MRN: [id number]   DOB - 1976-06-12  Admit Date - 11/30/2015  Outpatient Primary MD for the patient is Lubertha South, MD   With History of -  Past Medical History  Diagnosis Date  . 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 Crohn's disease with fistula (HCC) 05/04/2011    both large and small intestinges/notes 11/15/2012  . Hepatomegaly 05/04/2011  . Fatty liver 05/04/2011    "stage III fatty liver fibrosis" (11/15/2012)  . GERD (gastroesophageal reflux disease)   . Chronic liver disease     /notes 11/15/2012  . Pericarditis     [name] 11/15/2012  . Hypertension   . Pneumonia 1977  . Shortness of breath     "all the time right now" (11/15/2012)  . History of blood transfusion 2004  . History of stomach ulcers   . Duodenal ulcer   . Depression   . Kidney stones     bilaterally/notes 11/15/2012  . Hepatic fibrosis (HCC)     [name] 11/15/2012  . Anemia   . Pericardial effusion 10/29/2012    moderate to large/notes 11/15/2012  . Anxiety   . Hepatitis   . Crohn's disease (HCC)   . ED (erectile dysfunction)   . Cirrhosis (HCC)     secondary to drug effect, +/- fatty liver      Past Surgical History  Procedure Laterality Date  . Anal examination under anesthesia  02/11/2011    [name]  . Treatment fistula anal  07/03/11    This was a second surgery to repair Anal fistula  . Gastrojejunostomy  2004    "had hole cut in small intestines during endoscopy; had to have OR & leave me open 3 months" (11/15/2012)  . Cholecystectomy  ~ 2003  . Appendectomy  ~ 2003  . Video assisted thoracoscopy  11/17/2012    Procedure: VIDEO ASSISTED THORACOSCOPY;  Surgeon: Loreli Slot, MD;  Location: [facility] OR;  Service: Thoracic;  Laterality: Left;  drainage of left pleural effusion  . Pericardial window  11/17/2012    Procedure: PERICARDIAL WINDOW;  Surgeon: Loreli Slot, MD;  Location: [facility] OR;  Service: Thoracic;  Laterality: N/A;  .  Esophagogastroduodenoscopy  01/06/2013    Procedure: ESOPHAGOGASTRODUODENOSCOPY (EGD);  Surgeon: Malissa Hippo, MD;  Location: [facility] SUITE;  Service: Endoscopy;  Laterality: N/A;  11:15  . Esophagogastroduodenoscopy N/A 03/09/2014    Dr. Karilyn Cota: erosive/ulcerative reflux esophagiits, portal gastropathy, moderate amount of bile and food debris in stomach precluding visualization of GJ anastomosis  . Gastrostomy tube placement  11/19/14    [facility]: 66 F APDL catheter. Checked again 12/23 via fluoroscopy and in suitable position  . Colonoscopy  Aug 2015    Dr. Edyth Gunnels at [facility]: congested mucosa in entire colon, solitary ulcer distal ileum, stricture in ileum 5 cm from IC valve s/p dilation. Surveillance in 1 year  . Exploratory laparotomy  Nov 05, 2014    [facility], Dr. Byrd Hesselbach. Several hour operation with unsuccessful lysis of adhesions due to frozen abdomen  . Picc line    . Tee without cardioversion N/A 01/28/2015    Procedure: TRANSESOPHAGEAL ECHOCARDIOGRAM (TEE);  Surgeon: Thurmon Fair, MD;  Location: [facility] ENDOSCOPY;  Service: Cardiovascular;  Laterality: N/A;  . Esophagogastroduodenoscopy (egd) with propofol N/A 10/04/2015    Procedure: ESOPHAGOGASTRODUODENOSCOPY (EGD) WITH PROPOFOL;  Surgeon: Malissa Hippo, MD;  Location: [facility] ORS;  Service: Endoscopy;  Laterality: N/A;  855  . Peg  placement N/A 10/04/2015    Procedure: PERCUTANEOUS ENDOSCOPIC GASTROSTOMY (PEG) PLACEMENT;  Surgeon: Malissa Hippo, MD;  Location: [facility] ORS;  Service: Endoscopy;  Laterality: N/A;    in for   Chief Complaint  Patient presents with  . Altered Mental Status     HPI  David [name]  is a 39 y.o. male,  With history of Crohn's disease with small bowel obstruction, on TPN for a year, advanced liver cirrhosis, severe malnutrition secondary to chronic illness, abdominal pain, dependent on the lower lid PCA, anemia of chronic disease, patient brought for altered mental status, patient can't provide any history,  parents at bedside to provide history of present illness, they report over last 24-48 hour, patient has been more weak, fatigued, more lethargic, yesterday he stopped his TPN, as well stopped his Dilaudid PCA, patient was found this a.m. by his mother on the floor laying increases, last seen earlier in the day by his wife, in ED workup was significant for acute renal failure with a creatinine of 4, Leukocytosis 14.6, hemoglobin 8.1, and thrombocytopenia of 3.9, patient was unresponsive, noticed to have brief episode of seizures in ED, sepsis protocol was started, on IV fluids, patient was noticed to have a brief episode of twitching last seizure like activity in ED ,I was called to admit.    Review of Systems    Patient is ,   Social History Social History  Substance Use Topics  . Smoking status: Former Smoker -- 0.50 packs/day for 24 years    Types: Cigarettes    Start date: 10/28/2012    Quit date: 11/22/2012  . Smokeless tobacco: Current User    Types: Snuff     Comment: 1/2 pack a month  . Alcohol Use: No     Family History Family History  Problem Relation Age of Onset  . Diabetes Mother   . Diabetes Brother   . Healthy Daughter   . Healthy Son   . Colon cancer Neg Hx      Prior to Admission medications   Medication Sig Start Date End Date Taking? Authorizing Provider  cloNIDine (CATAPRES - DOSED IN MG/24 HR) 0.1 mg/24hr patch Place 0.1 mg onto the skin once a week.  09/17/15   Historical Provider, MD  dextrose 5 % solution  10/10/15   Historical Provider, MD  dicyclomine (BENTYL) 10 MG capsule Take 1 capsule (10 mg total) by mouth 3 (three) times daily as needed (stomach cramps). 06/07/15   Len Blalock, NP  ferrous gluconate (FERGON) 324 MG tablet Take 324 mg by mouth 3 (three) times daily. 11/21/14   Historical Provider, MD  fish oil-omega-3 fatty acids 1000 MG capsule Take 1 g by mouth 3 (three) times daily. 02/09/12   Malissa Hippo, MD  Gabapentin 300 MG/6ML SOLN  Take 12 mLs by mouth 3 (three) times daily. 09/17/15 10/17/15  Historical Provider, MD  HYDROmorphone (DILAUDID) 500 MG/50ML SOLN injection 500 mg. Patient states that he pushes the button ever 10 minutes. 09/23/15   Historical Provider, MD  metolazone (ZAROXOLYN) 5 MG tablet Take 1 tablet (5 mg total) by mouth 2 (two) times daily. 01/01/15   Erick Blinks, MD  nystatin cream (MYCOSTATIN) Apply 1 application topically 3 (three) times daily as needed for dry skin. Use as directed 04/30/15   Malissa Hippo, MD  pantoprazole (PROTONIX) 40 MG tablet Take 1 tablet (40 mg total) by mouth 2 (two) times daily. 06/18/14   Malissa Hippo, MD  PAXIL  10 MG/5ML suspension Take 10 mg by mouth daily.  07/10/15   Historical Provider, MD  pentamidine (PENTAM) 300 MG inhalation solution Inhale 300 mg into the lungs every 28 (twenty-eight) days. 09/28/15   Historical Provider, MD  polyethylene glycol (MIRALAX / GLYCOLAX) packet Take 17 g by mouth daily as needed for mild constipation or moderate constipation.  10/26/14   Historical Provider, MD  potassium chloride 20 MEQ/15ML (10%) SOLN Take 30 mLs (40 mEq total) by mouth 3 (three) times daily. 01/01/15   Erick Blinks, MD  Probiotic Product (ALIGN) 4 MG CAPS Take 4 mg by mouth daily.     Historical Provider, MD  promethazine (PHENERGAN) 25 MG tablet Take 1 tablet (25 mg total) by mouth 2 (two) times daily as needed. nausea 11/27/14   Malissa Hippo, MD  propranolol (INDERAL) 10 MG tablet Take 10 mg by mouth 2 (two) times daily.  08/06/15   Historical Provider, MD  sodium chloride 0.9 % infusion as needed.  08/07/15   Historical Provider, MD  sucralfate (CARAFATE) 1 G tablet Take 1 g by mouth 3 (three) times daily before meals.    Historical Provider, MD    Allergies  Allergen Reactions  . Remicade [Infliximab]     Dr Karilyn Cota states previous respiratory arrest not related to remicade. Dr Karilyn Cota states remicade not a drug related allergy. 07-07-2013 at 1025 rapid response  called to PACU- Patient difficulty breathing after infusion of Remicade infusing. Do NOT Give Remicade!  . Fentanyl And Related Itching    Swelling, Redness Tolerated fentanyl 01/2015  . Alfentanil Itching    Swelling, Redness  . Wellbutrin [Bupropion] Nausea And Vomiting  . Morphine And Related Hives    Physical Exam  Vitals  Blood pressure 93/60, pulse 113, temperature 98 F (36.7 C), temperature source Oral, resp. rate 22, height 5\' 7"  (1.702 m), weight 64.864 kg (143 lb), SpO2 95 %.   1. General, frail, ill-appearing, severely malnourished/cachectic male laying in bed,   2. Unresponsive, noncommunicative   3. Not  Able to perform neurological exam giving mental status  4. Ears appear Normal, Conjunctivae clear,dry oral mucosa   5. Supple Neck, No JVD,No Carotid Bruits.  6. Symmetrical Chest wall movement, decreased  air movement bilaterally,no wheezing, mild use of accessory muscle, right chest PICC .  7. Tachycardic,No Gallops, Rubs or Murmurs, No Parasternal Heave.  8. Draining bag in epigastric area ,abdomen with ascites , diminished bowel sounds , no rebound or guarding   9.  No Cyanosis, delayed Skin Turgor  10.   joints appear normal , no effusions, lower extremity edema +1 .      Data Review  CBC  Recent Labs Lab 11/23/2015 1119  WBC 14.6*  HGB 8.1*  HCT 23.2*  PLT 39*  MCV 88.5  MCH 30.9  MCHC 34.9  RDW 17.4*  LYMPHSABS 1.3  MONOABS 0.9  EOSABS 0.1  BASOSABS 0.0   ------------------------------------------------------------------------------------------------------------------  Chemistries   Recent Labs Lab 12/11/2015 1119  NA 132*  K 3.5  CL 91*  CO2 30  GLUCOSE 111*  BUN 110*  CREATININE 4.06*  CALCIUM 7.1*  AST 44*  ALT 20  ALKPHOS 177*  BILITOT 1.7*   ------------------------------------------------------------------------------------------------------------------ estimated creatinine clearance is 22.4 mL/min (by C-G  formula based on Cr of 4.06). ------------------------------------------------------------------------------------------------------------------ No results for input(s): TSH, T4TOTAL, T3FREE, THYROIDAB in the last 72 hours.  Invalid input(s): FREET3   Coagulation profile No results for input(s): INR, PROTIME in the last 168 hours. -------------------------------------------------------------------------------------------------------------------  No results for input(s): DDIMER in the last 72 hours. -------------------------------------------------------------------------------------------------------------------  Cardiac Enzymes No results for input(s): CKMB, TROPONINI, MYOGLOBIN in the last 168 hours.  Invalid input(s): CK ------------------------------------------------------------------------------------------------------------------ Invalid input(s): POCBNP   ---------------------------------------------------------------------------------------------------------------  Urinalysis    Component Value Date/Time   COLORURINE AMBER* 12/08/2015 1200   APPEARANCEUR HAZY* 12/02/2015 1200   LABSPEC 1.010 12/13/2015 1200   PHURINE 6.5 12/11/2015 1200   GLUCOSEU NEGATIVE 12/12/2015 1200   HGBUR LARGE* 11/22/2015 1200   BILIRUBINUR SMALL* 12/11/2015 1200   BILIRUBINUR ++ 09/26/2014 1618   KETONESUR NEGATIVE 11/17/2015 1200   PROTEINUR 100* 12/08/2015 1200   PROTEINUR 30 09/26/2014 1618   UROBILINOGEN 0.2 12/15/2014 1545   NITRITE NEGATIVE 12/14/2015 1200   LEUKOCYTESUR MODERATE* 11/28/2015 1200    ----------------------------------------------------------------------------------------------------------------  Imaging results:   Ct Head Wo Contrast  11/15/2015  CLINICAL DATA:  Unwitnessed fall at home. Decreased level of consciousness. EXAM: CT HEAD WITHOUT CONTRAST CT MAXILLOFACIAL WITHOUT CONTRAST TECHNIQUE: Multidetector CT imaging of the head and maxillofacial structures  were performed using the standard protocol without intravenous contrast. Multiplanar CT image reconstructions of the maxillofacial structures were also generated. COMPARISON:  None. FINDINGS: CT HEAD FINDINGS Motion artifact present despite repeating. No acute intracranial abnormality. Specifically, no hemorrhage, hydrocephalus, mass lesion, acute infarction, or significant intracranial injury. No acute calvarial abnormality. CT MAXILLOFACIAL FINDINGS No evidence of facial or orbital fracture. Rounded mucosal thickening in the right maxillary sinus. Paranasal sinuses and mastoids otherwise clear. No air-fluid levels. Mandible and zygomatic arches intact. IMPRESSION: No acute intracranial abnormality. No evidence of facial/ orbital fracture. Electronically Signed   By: Charlett Nose M.D.   On: 12/06/2015 12:34   Dg Abd Acute W/chest  11/24/2015  CLINICAL DATA:  Abdominal pain. EXAM: DG ABDOMEN ACUTE W/ 1V CHEST COMPARISON:  Chest x-ray 10/04/2015 FINDINGS: Low lung volumes. There is mild cardiomegaly. No confluent airspace opacity. Right central line tip is at the cavoatrial junction. No effusion. Prior cholecystectomy. Nonobstructive bowel gas pattern. No free air organomegaly. No acute bony abnormality. IMPRESSION: No evidence of bowel obstruction or free air. Cardiomegaly.  Low lung volumes.  No acute cardiopulmonary disease. Electronically Signed   By: Charlett Nose M.D.   On: 12/01/2015 12:25   Ct Maxillofacial Wo Cm  12/13/2015  CLINICAL DATA:  Unwitnessed fall at home. Decreased level of consciousness. EXAM: CT HEAD WITHOUT CONTRAST CT MAXILLOFACIAL WITHOUT CONTRAST TECHNIQUE: Multidetector CT imaging of the head and maxillofacial structures were performed using the standard protocol without intravenous contrast. Multiplanar CT image reconstructions of the maxillofacial structures were also generated. COMPARISON:  None. FINDINGS: CT HEAD FINDINGS Motion artifact present despite repeating. No acute  intracranial abnormality. Specifically, no hemorrhage, hydrocephalus, mass lesion, acute infarction, or significant intracranial injury. No acute calvarial abnormality. CT MAXILLOFACIAL FINDINGS No evidence of facial or orbital fracture. Rounded mucosal thickening in the right maxillary sinus. Paranasal sinuses and mastoids otherwise clear. No air-fluid levels. Mandible and zygomatic arches intact. IMPRESSION: No acute intracranial abnormality. No evidence of facial/ orbital fracture. Electronically Signed   By: Charlett Nose M.D.   On: 12/11/2015 12:34        Assessment & Plan  Active Problems:   Crohn's disease of both small and large intestine with fistula (HCC)   GERD (gastroesophageal reflux disease)   Hepatic fibrosis (HCC)   Liver cirrhosis (HCC)   Symptomatic anemia   Adult failure to thrive   Severe dehydration   Acute renal failure syndrome (HCC)   Sepsis (HCC)   Sepsis -  Patient  presents with leukocytosis, elevated lactic acid, tachycardia, blood cultures were sent, no evidence of pneumonia on chest x-ray, urinalysis is positive for UTI, started on broad-spectrum IV antibiotics  vancomycin and Zosyn, will admit to ICU , continue with IV fluids .  Acute renal failure  - Most likely due to volume depletion and dehydration and sepsis, no no evidence of urinary retention , continue with IV fluids .   seizures - Patient was noticed to have an episode of seizure  like activity in ED , lasted briefly for 30 seconds, resolved without intervention , patient stopped his Dilantin PCA yesterday, may be opioid withdrawal seizures , as well maybe he might be postictal giving his seizures , will start an IV Keppra, check EEG, will consult neuro , will keep on when necessary Ativan .  Acute encephalopathy - No acute finding in CT head, this is most likely metabolic encephalopathy from sepsis, possible postictal as well.  Liver cirrhosis/Crohn's disease - We'll consult GI  Severe  protein calorie malnutrition/dehydration - We'll consult pharmacy to resume TPN  Anemia - Hemoglobin is 8.1,  most likely multifactorial, anemia of iron deficiency, chronic illness, and chronic blood loss, I would anticipate significant drop after appropriate hydration, will transfuse 2 units PRBC.   GERD  - Will start patient on Protonix 40 mg IV twice a day , due to possible GI bleed as having dark colored draining through epigastric bag .  Elevated LFTs  - Secondary to liver disease   Thrombocytopenia - Chronic, but this is lowest ever been, is likely complicated by sepsis, no evidence of overt bleed, no indication for transfusion.  DVT Prophylaxis  SCDs   AM Labs Ordered, also please review Full Orders  Family Communication: Admission, patients condition and plan of care including tests being ordered have been discussed with the patient  Parents at bedside , and wife over the phone  who indicate understanding and agree with the plan and Code Status.  Code Status DNR, confirmed by wife   Likely DC to  admit to ICU   Condition GUARDED  /critical   Time spent in minutes : 60 minutes of critical care time on this patient with multiorgan failure .    Randol Kern, Lyriq Finerty M.D on Dec 16, 2015 at 1:41 PM  Between 7am to 7pm - Pager - [phone]  After 7pm go to [email or web address] - password [email or web address]  And look for the night coverage person covering me after hours  [facility] Office  [phone]

## 2015-11-25 NOTE — ED Notes (Signed)
Hospitalist and family at bedside

## 2015-11-25 NOTE — Consult Note (Signed)
Referring Provider: Dr. Randol Kern  Primary Care Physician:  Lubertha South, MD Primary Gastroenterologist:  Dr. Karilyn Cota   Date of Admission: 12/06/2015 Date of Consultation: 11/15/2015  Reason for Consultation:  Cirrhosis, Crohn's disease   HPI:  David [name] is a 39 y.o. year old male with an extensive past medical history and well known to our practice, followed by Dr. Karilyn Cota as primary gastroenterologist. He has a history of Crohn's disease, on TPN for almost a year, in need of small bowel transplant and previously listed at [facility] but then removed from list due to pneumonia and septic arthritis. Advanced cirrhosis, previously on transplant list at [facility] but removed from list. Felt to have gastroparesis. Chronic abdominal pain, severe malnutrition in setting of chronic illness. Last seen by Dr. Karilyn Cota Nov 12, 2015. At that point, notes states that Dr. Karilyn Cota spoke with Dr. Sharlene Motts at [facility] and requested reconsideration of waitlist for combined liver and small bowel transplant.   Presented with altered mental status, worsening fatigue, lethargy, stopped TPN yesterday and Dilaudid, found by his mother unresponsive. Admitted with acute renal failure, leukocytosis, seizure-like activity in the ED. On sepsis protocol. GI asked to see patient due to history of cirrhosis and liver disease. Wife and multiple family members at bedside. Wife states he has been vomiting intermittently recently, with more output from stoma at prior G-tube site. Dark brown liquid coming from site of prior G-tube. No hematochezia or definite melena. Has not seen [facility] recently and currently not a transplant candidate.   Past Medical History  Diagnosis Date  . 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 Crohn's disease with fistula (HCC) 05/04/2011    both large and small intestinges/notes 11/15/2012  . Hepatomegaly 05/04/2011  . Fatty liver 05/04/2011    "stage III fatty liver fibrosis" (11/15/2012)  . GERD (gastroesophageal reflux disease)   .  Chronic liver disease     /notes 11/15/2012  . Pericarditis     David [name] 11/15/2012  . Hypertension   . Pneumonia 1977  . Shortness of breath     "all the time right now" (11/15/2012)  . History of blood transfusion 2004  . History of stomach ulcers   . Duodenal ulcer   . Depression   . Kidney stones     bilaterally/notes 11/15/2012  . Hepatic fibrosis (HCC)     David [name] 11/15/2012  . Anemia   . Pericardial effusion 10/29/2012    moderate to large/notes 11/15/2012  . Anxiety   . Hepatitis   . Crohn's disease (HCC)   . ED (erectile dysfunction)   . Cirrhosis (HCC)     secondary to drug effect, +/- fatty liver    Past Surgical History  Procedure Laterality Date  . Anal examination under anesthesia  02/11/2011    [name]  . Treatment fistula anal  07/03/11    This was a second surgery to repair Anal fistula  . Gastrojejunostomy  2004    "had hole cut in small intestines during endoscopy; had to have OR & leave me open 3 months" (11/15/2012)  . Cholecystectomy  ~ 2003  . Appendectomy  ~ 2003  . Video assisted thoracoscopy  11/17/2012    Procedure: VIDEO ASSISTED THORACOSCOPY;  Surgeon: Loreli Slot, MD;  Location: [facility] OR;  Service: Thoracic;  Laterality: Left;  drainage of left pleural effusion  . Pericardial window  11/17/2012    Procedure: PERICARDIAL WINDOW;  Surgeon: Loreli Slot, MD;  Location: [facility] OR;  Service: Thoracic;  Laterality: N/A;  . Esophagogastroduodenoscopy  01/06/2013    Procedure: ESOPHAGOGASTRODUODENOSCOPY (EGD);  Surgeon: Malissa Hippo, MD;  Location: [facility] END[facility] SUITE;  Service: Endoscopy;  Laterality: N/A;  11:15  . Esophagogastroduodenoscopy N/A 03/09/2014    Dr. Karilyn Cota: erosive/ulcerative reflux esophagiits, portal gastropathy, moderate amount of bile and food debris in stomach precluding visualization of GJ anastomosis  . Gastrostomy tube placement  11/19/14    [facility]: 49 F APDL catheter. Checked again 12/23 via fluoroscopy and in suitable position  .  Colonoscopy  Aug 2015    Dr. Edyth Gunnels at [facility]: congested mucosa in entire colon, solitary ulcer distal ileum, stricture in ileum 5 cm from IC valve s/p dilation. Surveillance in 1 year  . Exploratory laparotomy  Nov 05, 2014    [facility], Dr. Byrd Hesselbach. Several hour operation with unsuccessful lysis of adhesions due to frozen abdomen  . Picc line    . Tee without cardioversion N/A 01/28/2015    Procedure: TRANSESOPHAGEAL ECHOCARDIOGRAM (TEE);  Surgeon: Thurmon Fair, MD;  Location: [facility] ENDOSCOPY;  Service: Cardiovascular;  Laterality: N/A;  . Esophagogastroduodenoscopy (egd) with propofol N/A 10/04/2015    Procedure: ESOPHAGOGASTRODUODENOSCOPY (EGD) WITH PROPOFOL;  Surgeon: Malissa Hippo, MD;  Location: [facility] ORS;  Service: Endoscopy;  Laterality: N/A;  855  . Peg placement N/A 10/04/2015    Procedure: PERCUTANEOUS ENDOSCOPIC GASTROSTOMY (PEG) PLACEMENT;  Surgeon: Malissa Hippo, MD;  Location: [facility] ORS;  Service: Endoscopy;  Laterality: N/A;    Prior to Admission medications   Medication Sig Start Date End Date Taking? Authorizing Provider  cloNIDine (CATAPRES - DOSED IN MG/24 HR) 0.1 mg/24hr patch Place 0.1 mg onto the skin once a week.  09/17/15   Historical Provider, MD  dextrose 5 % solution  10/10/15   Historical Provider, MD  dicyclomine (BENTYL) 10 MG capsule Take 1 capsule (10 mg total) by mouth 3 (three) times daily as needed (stomach cramps). 06/07/15   Len Blalock, NP  ferrous gluconate (FERGON) 324 MG tablet Take 324 mg by mouth 3 (three) times daily. 11/21/14   Historical Provider, MD  fish oil-omega-3 fatty acids 1000 MG capsule Take 1 g by mouth 3 (three) times daily. 02/09/12   Malissa Hippo, MD  Gabapentin 300 MG/6ML SOLN Take 12 mLs by mouth 3 (three) times daily. 09/17/15 10/17/15  Historical Provider, MD  HYDROmorphone (DILAUDID) 500 MG/50ML SOLN injection 500 mg. Patient states that he pushes the button ever 10 minutes. 09/23/15   Historical Provider, MD  metolazone (ZAROXOLYN)  5 MG tablet Take 1 tablet (5 mg total) by mouth 2 (two) times daily. 01/01/15   Erick Blinks, MD  nystatin cream (MYCOSTATIN) Apply 1 application topically 3 (three) times daily as needed for dry skin. Use as directed 04/30/15   Malissa Hippo, MD  pantoprazole (PROTONIX) 40 MG tablet Take 1 tablet (40 mg total) by mouth 2 (two) times daily. 06/18/14   Malissa Hippo, MD  PAXIL 10 MG/5ML suspension Take 10 mg by mouth daily.  07/10/15   Historical Provider, MD  pentamidine (PENTAM) 300 MG inhalation solution Inhale 300 mg into the lungs every 28 (twenty-eight) days. 09/28/15   Historical Provider, MD  polyethylene glycol (MIRALAX / GLYCOLAX) packet Take 17 g by mouth daily as needed for mild constipation or moderate constipation.  10/26/14   Historical Provider, MD  potassium chloride 20 MEQ/15ML (10%) SOLN Take 30 mLs (40 mEq total) by mouth 3 (three) times daily. 01/01/15   Erick Blinks, MD  Probiotic Product (ALIGN) 4 MG CAPS Take 4  mg by mouth daily.     Historical Provider, MD  promethazine (PHENERGAN) 25 MG tablet Take 1 tablet (25 mg total) by mouth 2 (two) times daily as needed. nausea 11/27/14   Malissa Hippo, MD  propranolol (INDERAL) 10 MG tablet Take 10 mg by mouth 2 (two) times daily.  08/06/15   Historical Provider, MD  sodium chloride 0.9 % infusion as needed.  08/07/15   Historical Provider, MD  sucralfate (CARAFATE) 1 G tablet Take 1 g by mouth 3 (three) times daily before meals.    Historical Provider, MD    Current Facility-Administered Medications  Medication Dose Route Frequency Provider Last Rate Last Dose  . 0.9 %  sodium chloride infusion   Intravenous STAT Eber Hong, MD      . 0.9 %  sodium chloride infusion   Intravenous Continuous Starleen Arms, MD 100 mL/hr at 11/30/2015 1507    . levETIRAcetam (KEPPRA) 500 mg in sodium chloride 0.9 % 100 mL IVPB  500 mg Intravenous Q12H Starleen Arms, MD   500 mg at 11/14/2015 1528  . LORazepam (ATIVAN) injection 1 mg  1 mg  Intravenous Q4H PRN Starleen Arms, MD      . ondansetron (ZOFRAN) injection 4 mg  4 mg Intravenous Q8H PRN Eber Hong, MD      . piperacillin-tazobactam (ZOSYN) 2.25 g in dextrose 5 % 50 mL IVPB  2.25 g Intravenous Q6H Dawood S Elgergawy, MD      . sodium chloride 0.9 % injection 3 mL  3 mL Intravenous Q12H Dawood S Elgergawy, MD      . vancomycin (VANCOCIN) 1,250 mg in sodium chloride 0.9 % 250 mL IVPB  1,250 mg Intravenous Once Eber Hong, MD   1,250 mg at 11/15/2015 1502  . [START ON 11/27/2015] vancomycin (VANCOCIN) IVPB 1000 mg/200 mL premix  1,000 mg Intravenous Q48H Starleen Arms, MD        Allergies as of 11/24/2015 - Review Complete 12/02/2015  Allergen Reaction Noted  . Remicade [infliximab]  07/07/2013  . Fentanyl and related Itching 03/06/2014  . Alfentanil Itching 08/21/2014  . Wellbutrin [bupropion] Nausea And Vomiting 04/12/2013  . Morphine and related Hives 07/03/2011    Family History  Problem Relation Age of Onset  . Diabetes Mother   . Diabetes Brother   . Healthy Daughter   . Healthy Son   . Colon cancer Neg Hx     Social History   Social History  . Marital Status: Married    Spouse Name: N/A  . Number of Children: 2  . Years of Education: N/A   Occupational History  . disabled/retired    Social History Main Topics  . Smoking status: Former Smoker -- 0.50 packs/day for 24 years    Types: Cigarettes    Start date: 10/28/2012    Quit date: 11/22/2012  . Smokeless tobacco: Current User    Types: Snuff     Comment: 1/2 pack a month  . Alcohol Use: No  . Drug Use: No  . Sexual Activity: Not Currently   Other Topics Concern  . Not on file   Social History Narrative   Crohns h/o      This is a 39 y.o. year old male with Crohn's disease. He had symptoms of abdominal pain since he was 14 but was diagnosed in 2002-2004. He had an EGD at some point that showed esophageal, gastric and antral ulcers. It is not clear if this was related  to  Crohn's or peptic. He was treated with acid suppressive therapy without much luck so he had another EGD which was complicated by duodenal perforation that required surgical repair/gastrojejunostomy. His post-op course was complicated by wound infection. At some point he was diagnosed with ileocolonic Crohn's disease. Historically he was treated with oral 5 ASA, prednisone, Imuran, MTX and anti-TNF therapy. He started with Remicade and was on it for 2 months without improvement so it was stopped and patient was switched to 6 MP. While on the 6 MP he had to be on steroids most of the time so 6 MP was discontinued. He had no side effects with the 6 MP. Around that time he was treated with MTX weekly injection, he said on and off for few years. He tells me that after that he tried Humira 6-12 months and Cimzia 6-12 month without prolonged response. It always seemed that he has initial response to the therapy that quickly wears off. He was re-challenged with Remicade around 2008 and stayed on it since then. His dose and dosing interval had to be changed multiple times due to clinical symptoms. 2-3 months ago he had an anaphylactic reactions to Remicade x 2 despite that he was pre-medicated with 200 mg of hydrocortisone. His Remicade was stopped. No Remicade level or antibody was checked. It is not clear why he was not on IMM while on Remicade.       About 1-2 years he was diagnosed with cirrhosis, he tells me it's due to MTX although he was on weekly injections and only for few years. There is mention of possible NASH as an etiology seen on liver bx in the reviewed notes. He is followed at [facility] for his cirrhosis, his complications are mainly ascites and edema. He also has CRI. Multiple imaging studies including MRE and CTE showing active disease in TI. TPMT normal October 2014. EGD in 1/14 showed esophagitis, gastritis, PHG and gastrojejunostomy. 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  EGD 11/14 with Grade I varices, colonoscopy with stenosed ileocecal valve that could not be traversed, and SBFT with gastric pylorus ulcer, duodenal diverticulum, possible duodenal Crohn's, and evidence of long standing small bowel Crohn's       Review of Systems: Unable to obtain due to altered mental status.   Physical Exam: Vital signs in last 24 hours: Temp:  [98 F (36.7 C)-98.4 F (36.9 C)] 98.4 F (36.9 C) (12/12 1343) Pulse Rate:  [106-127] 127 (12/12 1530) Resp:  [18-36] 31 (12/12 1530) BP: (92-110)/(60-82) 110/75 mmHg (12/12 1530) SpO2:  [88 %-99 %] 96 % (12/12 1530) Weight:  [143 lb (64.864 kg)] 143 lb (64.864 kg) (12/12 1047)   General:  Appears chronically ill, cachectic, much older than actual age.  Head:  Normocephalic and atraumatic. Eyes:  Sclera clear, no icterus.  Lungs:  Coarse, labored breathing and tachypneic  Heart:  S1 S2 present, tachycardic  Abdomen: Distended, tight, hypoactive bowel sounds, prior site of G tube with stoma bag attached, draining dark brown liquid to gravity drainage Rectal:  Deferred  Msk:  Muscle wasting bilateral lower extremities, thin upper extremities  Extremities:  Pedal edema  Neurologic:  Unable to assess    Intake/Output from previous day:   Intake/Output this shift: Total I/O In: 30 [Other:30] Out: -   Lab Results:  Recent Labs  12-13-2015 1119  WBC 14.6*  HGB 8.1*  HCT 23.2*  PLT 39*   BMET  Recent Labs  11/18/2015 1119  NA 132*  K 3.5  CL 91*  CO2 30  GLUCOSE 111*  BUN 110*  CREATININE 4.06*  CALCIUM 7.1*   LFT  Recent Labs  12/13/2015 1119  PROT 6.4*  ALBUMIN 2.0*  AST 44*  ALT 20  ALKPHOS 177*  BILITOT 1.7*    Studies/Results: Ct Head Wo Contrast  12/05/2015  CLINICAL DATA:  Unwitnessed fall at home. Decreased level of consciousness. EXAM: CT HEAD WITHOUT CONTRAST CT MAXILLOFACIAL WITHOUT CONTRAST TECHNIQUE: Multidetector CT imaging of the head and maxillofacial structures were performed  using the standard protocol without intravenous contrast. Multiplanar CT image reconstructions of the maxillofacial structures were also generated. COMPARISON:  None. FINDINGS: CT HEAD FINDINGS Motion artifact present despite repeating. No acute intracranial abnormality. Specifically, no hemorrhage, hydrocephalus, mass lesion, acute infarction, or significant intracranial injury. No acute calvarial abnormality. CT MAXILLOFACIAL FINDINGS No evidence of facial or orbital fracture. Rounded mucosal thickening in the right maxillary sinus. Paranasal sinuses and mastoids otherwise clear. No air-fluid levels. Mandible and zygomatic arches intact. IMPRESSION: No acute intracranial abnormality. No evidence of facial/ orbital fracture. Electronically Signed   By: Charlett Nose M.D.   On: 12/06/2015 12:34   Dg Abd Acute W/chest  11/24/2015  CLINICAL DATA:  Abdominal pain. EXAM: DG ABDOMEN ACUTE W/ 1V CHEST COMPARISON:  Chest x-ray 10/04/2015 FINDINGS: Low lung volumes. There is mild cardiomegaly. No confluent airspace opacity. Right central line tip is at the cavoatrial junction. No effusion. Prior cholecystectomy. Nonobstructive bowel gas pattern. No free air organomegaly. No acute bony abnormality. IMPRESSION: No evidence of bowel obstruction or free air. Cardiomegaly.  Low lung volumes.  No acute cardiopulmonary disease. Electronically Signed   By: Charlett Nose M.D.   On: 12/08/2015 12:25   Ct Maxillofacial Wo Cm  11/18/2015  CLINICAL DATA:  Unwitnessed fall at home. Decreased level of consciousness. EXAM: CT HEAD WITHOUT CONTRAST CT MAXILLOFACIAL WITHOUT CONTRAST TECHNIQUE: Multidetector CT imaging of the head and maxillofacial structures were performed using the standard protocol without intravenous contrast. Multiplanar CT image reconstructions of the maxillofacial structures were also generated. COMPARISON:  None. FINDINGS: CT HEAD FINDINGS Motion artifact present despite repeating. No acute intracranial  abnormality. Specifically, no hemorrhage, hydrocephalus, mass lesion, acute infarction, or significant intracranial injury. No acute calvarial abnormality. CT MAXILLOFACIAL FINDINGS No evidence of facial or orbital fracture. Rounded mucosal thickening in the right maxillary sinus. Paranasal sinuses and mastoids otherwise clear. No air-fluid levels. Mandible and zygomatic arches intact. IMPRESSION: No acute intracranial abnormality. No evidence of facial/ orbital fracture. Electronically Signed   By: Charlett Nose M.D.   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 Has been steadily declining, followed closely by Dr. Karilyn Cota as an outpatient and felt to have gastroparesis, severe malnutrition secondary to chronic illness, anemia likely multifactorial. Now presenting with sepsis of unknown origin, acute renal failure, encephalopathy, and seizure activity while in the ED. Prognosis is poor and I suspect he is quickly approaching end of life. Unfortunately, he is not currently a transplant candidate and options are extremely limited for intervention and therapy. Does not appear to have overt GI bleeding. Will add Protonix, recommend supportive measures, and Dr. Karilyn Cota will resume care on 04-Dec-2015. Multiple family members at bedside, and it was relayed to them that Dr. Karilyn Cota would be resuming care  on 12/09/2015. No concerns or questions by family at this time.   Plan: Add Protonix IV BID Supportive care recommended Limited options Guarded prognosis Dr. Karilyn Cota to resume care on 12-09-15  Nira Retort, ANP-BC [facility]     LOS: 0 days    11/16/2015, 3:56 PM

## 2015-11-25 NOTE — Consult Note (Signed)
[place] A. Merlene Laughter, MD     [email or web address]          David [name] is an 39 y.o. male.   ASSESSMENT/PLAN: Moderate to severe encephalopathy due to multifactorial toxic metabolic etiologies.  Jerky movements of the extremities especially on the right side of unclear dysesthesias or not but I suspect this is most likely related to the patient's metabolic derangements.  Overall prognosis guarded given multiple medical problems.   RECOMMENDATION: Continue with Keppra for now. EEG. Repeat neurological exam.   The patient is a 39 year old white male who has a long history of Crohn disease. The patient presented with altered mental status. 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 The patient has been on hydromorphone PCA pump for chronic pain. He also is on TPN because of his chronic Crohn's disease and issues related to prior infection and dislocation of the PEG. It appears that the patient stopped taking his TPN and PCA pump yesterday. It is not entirely clear why this was done but could be because patient became acutely confused. The patient has had multiple abnormalities on presenting to the hospital. He appears to be dehydrated and has acute renal failure with creatinine of 4. He also has had thrombocytopenia. The patient was noted to have some jerking and was placed on Keppra because his happened a couple of times. There is no prior history of seizures. The wife is at the bedside. Apparently has been quite sick in the past from his Crohn's disease and other issues. However, he was lucid and coherent until he became ill recently. He appears to be tachypneic on examination the wife reports that this happened sometimes especially at nighttime.    GENERAL: He is breathing heavily and give neck. He does appear malnourished.   HEENT: Supple. Atraumatic normocephalic.   ABDOMEN:  soft  EXTREMITIES: No edema   BACK: Normal.  SKIN: Normal by inspection.    MENTAL STATUS: His eyes are open but he is unresponsive.  CRANIAL NERVES: Pupils large but reactive to light; extra ocular movements are full - by A cephalic reflexes, coronary reflexes are present, upper and lower facial muscles are normal in strength and symmetric, there is no flattening of the nasolabial folds.  MOTOR: Minimal response globally.  COORDINATION: No tremors or dysmetria is noted.  REFLEXES: Deep tendon reflexes are symmetrical and normal. Babinski reflexes are flexor bilaterally.   SENSATION: Diminished response to pain.     Blood pressure 97/61, pulse 128, temperature 97.8 F (36.6 C), temperature source Axillary, resp. rate 38, height _0  (1.778 m), weight 67.4 kg (148 lb 9.4 oz), SpO2 98 %.  Past Medical History  Diagnosis Date  . 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 Crohn's disease with fistula ([place]) 05/04/2011    both large and small intestinges/notes 11/15/2012  . Hepatomegaly 05/04/2011  . Fatty liver 05/04/2011    "stage III fatty liver fibrosis" (11/15/2012)  . GERD (gastroesophageal reflux disease)   . Chronic liver disease     /notes 11/15/2012  . Pericarditis     Archie Endo 11/15/2012  . Hypertension   . Pneumonia 1977  . Shortness of breath     "all the time right now" (11/15/2012)  . History of blood transfusion 2004  . History of stomach ulcers   . Duodenal ulcer   . Depression   . Kidney stones     bilaterally/notes 11/15/2012  . Hepatic fibrosis ([place])     /  notes 11/15/2012  . Anemia   . Pericardial effusion 10/29/2012    moderate to large/notes 11/15/2012  . Anxiety   . Hepatitis   . Crohn's disease ([place])   . ED (erectile dysfunction)   . 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 Anal examination under anesthesia  02/11/2011    [name]  . Treatment fistula anal  07/03/11    This was a  second surgery to repair Anal fistula  . Gastrojejunostomy  2004    "had hole cut in small intestines during endoscopy; had to have OR & leave me open 3 months" (11/15/2012)  . Cholecystectomy  ~ 2003  . Appendectomy  ~ 2003  . Video assisted thoracoscopy  11/17/2012    Procedure: VIDEO ASSISTED THORACOSCOPY;  Surgeon: Melrose Nakayama, MD;  Location: [place];  Service: Thoracic;  Laterality: Left;  drainage of left pleural effusion  . Pericardial window  11/17/2012    Procedure: PERICARDIAL WINDOW;  Surgeon: Melrose Nakayama, MD;  Location: [place];  Service: Thoracic;  Laterality: N/A;  . Esophagogastroduodenoscopy  01/06/2013    Procedure: ESOPHAGOGASTRODUODENOSCOPY (EGD);  Surgeon: Rogene Houston, MD;  [facility];  Service: Endoscopy;  Laterality: N/A;  11:15  . Esophagogastroduodenoscopy N/A 03/09/2014    Dr. Laural Golden: erosive/ulcerative reflux esophagiits, portal gastropathy, moderate amount of bile and food debris in stomach precluding visualization of GJ anastomosis  . Gastrostomy tube placement  11/19/14    [facility]: 12 F APDL catheter. Checked again 12/23 via fluoroscopy and in suitable position  . Colonoscopy  Aug 2015    Dr. Lysle Rubens at [facility]: congested mucosa in entire colon, solitary ulcer distal ileum, stricture in ileum 5 cm from IC valve s/p dilation. Surveillance in 1 year  . Exploratory laparotomy  Nov 05, 2014    [facility], Dr. Morton Stall. Several hour operation with unsuccessful lysis of adhesions due to frozen abdomen  . Picc line    . Tee without cardioversion N/A 01/28/2015    Procedure: TRANSESOPHAGEAL ECHOCARDIOGRAM (TEE);  Surgeon: Sanda Klein, MD;  Location: [facility] ENDOSCOPY;  Service: Cardiovascular;  Laterality: N/A;  . Esophagogastroduodenoscopy (egd) with propofol N/A 10/04/2015    Procedure: ESOPHAGOGASTRODUODENOSCOPY (EGD) WITH PROPOFOL;  Surgeon: Rogene Houston, MD;  Location: [facility];  Service: Endoscopy;  Laterality: N/A;  855  . Peg placement N/A  10/04/2015    Procedure: PERCUTANEOUS ENDOSCOPIC GASTROSTOMY (PEG) PLACEMENT;  Surgeon: Rogene Houston, MD;  Location: [facility];  Service: Endoscopy;  Laterality: N/A;    Family History  Problem Relation Age of Onset  . Diabetes Mother   . Diabetes Brother   . Healthy Daughter   . Healthy Son   . Colon cancer Neg Hx     Social History:  reports that he quit smoking about 3 years ago. His smoking use included Cigarettes. He started smoking about 3 years ago. He has a 12 pack-year smoking history. His smokeless tobacco use includes Snuff. He reports that he does not drink alcohol or use illicit drugs.  Allergies:  Allergies  Allergen Reactions  . Remicade [Infliximab]     Dr Laural Golden states previous respiratory arrest not related to remicade. Dr Laural Golden states remicade not a drug related allergy. 07-07-2013 at 1025 rapid response called to PACU- Patient difficulty breathing after infusion of Remicade infusing. Do NOT Give Remicade!  . Fentanyl And Related Itching    Swelling, Redness  Tolerated fentanyl 01/2015  . Alfentanil Itching    Swelling, Redness  . Wellbutrin [Bupropion] Nausea And Vomiting  . Morphine And Related Hives    Medications: Prior to Admission medications   Medication Sig Start Date End Date Taking? Authorizing Provider  cloNIDine (CATAPRES - DOSED IN MG/24 HR) 0.1 mg/24hr patch Place 0.1 mg onto the skin once a week.  09/17/15   Historical Provider, MD  dextrose 5 % solution  10/10/15   Historical Provider, MD  dicyclomine (BENTYL) 10 MG capsule Take 1 capsule (10 mg total) by mouth 3 (three) times daily as needed (stomach cramps). 06/07/15   Butch Penny, NP  ferrous gluconate (FERGON) 324 MG tablet Take 324 mg by mouth 3 (three) times daily. 11/21/14   Historical Provider, MD  fish oil-omega-3 fatty acids 1000 MG capsule Take 1 g by mouth 3 (three) times daily. 02/09/12   Rogene Houston, MD  Gabapentin 300 MG/6ML SOLN Take 12 mLs by mouth 3 (three) times daily.  09/17/15 10/17/15  Historical Provider, MD  HYDROmorphone (DILAUDID) 500 MG/50ML SOLN injection 500 mg. Patient states that he pushes the button ever 10 minutes. 09/23/15   Historical Provider, MD  metolazone (ZAROXOLYN) 5 MG tablet Take 1 tablet (5 mg total) by mouth 2 (two) times daily. 01/01/15   Kathie Dike, MD  nystatin cream (MYCOSTATIN) Apply 1 application topically 3 (three) times daily as needed for dry skin. Use as directed 04/30/15   Rogene Houston, MD  pantoprazole (PROTONIX) 40 MG tablet Take 1 tablet (40 mg total) by mouth 2 (two) times daily. 06/18/14   Rogene Houston, MD  PAXIL 10 MG/5ML suspension Take 10 mg by mouth daily.  07/10/15   Historical Provider, MD  pentamidine (PENTAM) 300 MG inhalation solution Inhale 300 mg into the lungs every 28 (twenty-eight) days. 09/28/15   Historical Provider, MD  polyethylene glycol (MIRALAX / GLYCOLAX) packet Take 17 g by mouth daily as needed for mild constipation or moderate constipation.  10/26/14   Historical Provider, MD  potassium chloride 20 MEQ/15ML (10%) SOLN Take 30 mLs (40 mEq total) by mouth 3 (three) times daily. 01/01/15   Kathie Dike, MD  Probiotic Product (ALIGN) 4 MG CAPS Take 4 mg by mouth daily.     Historical Provider, MD  promethazine (PHENERGAN) 25 MG tablet Take 1 tablet (25 mg total) by mouth 2 (two) times daily as needed. nausea 11/27/14   Rogene Houston, MD  propranolol (INDERAL) 10 MG tablet Take 10 mg by mouth 2 (two) times daily.  08/06/15   Historical Provider, MD  sodium chloride 0.9 % infusion as needed.  08/07/15   Historical Provider, MD  sucralfate (CARAFATE) 1 G tablet Take 1 g by mouth 3 (three) times daily before meals.    Historical Provider, MD    Scheduled Meds: . sodium chloride   Intravenous STAT  . antiseptic oral rinse  7 mL Mouth Rinse q12n4p  . chlorhexidine  15 mL Mouth Rinse BID  . levETIRAcetam  500 mg Intravenous Q12H  . pantoprazole (PROTONIX) IV  40 mg Intravenous Q12H  .  piperacillin-tazobactam (ZOSYN) IVPB  2.25 g Intravenous Q6H  . sodium chloride  3 mL Intravenous Q12H  . [START ON 11/27/2015] vancomycin  1,000 mg Intravenous Q48H   Continuous Infusions: . sodium chloride 100 mL/hr at 11/19/2015 1507   PRN Meds:.HYDROmorphone (DILAUDID) injection, LORazepam, ondansetron (ZOFRAN) IV     Results for orders placed or performed during the hospital encounter of  12/02/2015 (from the past 48 hour(s))  Comprehensive metabolic panel     Status: Abnormal   Collection Time: 12/14/2015 11:19 AM  Result Value Ref Range   Sodium 132 (L) 135 - 145 mmol/L   Potassium 3.5 3.5 - 5.1 mmol/L   Chloride 91 (L) 101 - 111 mmol/L   CO2 30 22 - 32 mmol/L   Glucose, Bld 111 (H) 65 - 99 mg/dL   BUN 110 (H) 6 - 20 mg/dL    Comment: RESULTS CONFIRMED BY MANUAL DILUTION   Creatinine, Ser 4.06 (H) 0.61 - 1.24 mg/dL   Calcium 7.1 (L) 8.9 - 10.3 mg/dL   Total Protein 6.4 (L) 6.5 - 8.1 g/dL   Albumin 2.0 (L) 3.5 - 5.0 g/dL   AST 44 (H) 15 - 41 U/L   ALT 20 17 - 63 U/L   Alkaline Phosphatase 177 (H) 38 - 126 U/L   Total Bilirubin 1.7 (H) 0.3 - 1.2 mg/dL   GFR calc non Af Amer 17 (L) >60 mL/min   GFR calc Af Amer 20 (L) >60 mL/min    Comment: (NOTE) The eGFR has been calculated using the CKD EPI equation. This calculation has not been validated in all clinical situations. eGFR's persistently <60 mL/min signify possible Chronic Kidney Disease.    Anion gap 11 5 - 15  CBC with Differential     Status: Abnormal   Collection Time: 12/02/2015 11:19 AM  Result Value Ref Range   WBC 14.6 (H) 4.0 - 10.5 K/uL   RBC 2.62 (L) 4.22 - 5.81 MIL/uL   Hemoglobin 8.1 (L) 13.0 - 17.0 g/dL   HCT 23.2 (L) 39.0 - 52.0 %   MCV 88.5 78.0 - 100.0 fL   MCH 30.9 26.0 - 34.0 pg   MCHC 34.9 30.0 - 36.0 g/dL   RDW 17.4 (H) 11.5 - 15.5 %   Platelets 39 (L) 150 - 400 K/uL    Comment: SPECIMEN CHECKED FOR CLOTS PLATELET COUNT CONFIRMED BY SMEAR    Neutrophils Relative % 84 %   Neutro Abs 12.3 (H)  1.7 - 7.7 K/uL   Lymphocytes Relative 9 %   Lymphs Abs 1.3 0.7 - 4.0 K/uL   Monocytes Relative 6 %   Monocytes Absolute 0.9 0.1 - 1.0 K/uL   Eosinophils Relative 1 %   Eosinophils Absolute 0.1 0.0 - 0.7 K/uL   Basophils Relative 0 %   Basophils Absolute 0.0 0.0 - 0.1 K/uL   WBC Morphology INCREASED BANDS (>20% BANDS)     Comment: TOXIC GRANULATION ATYPICAL LYMPHOCYTES   CK     Status: None   Collection Time: 11/24/2015 11:19 AM  Result Value Ref Range   Total CK 174 49 - 397 U/L  Phosphorus     Status: Abnormal   Collection Time: 12/14/2015 11:19 AM  Result Value Ref Range   Phosphorus 2.4 (L) 2.5 - 4.6 mg/dL  Magnesium     Status: None   Collection Time: 11/24/2015 11:19 AM  Result Value Ref Range   Magnesium 2.1 1.7 - 2.4 mg/dL  I-Stat CG4 Lactic Acid, ED (Not at [facility])     Status: Abnormal   Collection Time: 12/08/2015 11:30 AM  Result Value Ref Range   Lactic Acid, Venous 2.40 (HH) 0.5 - 2.0 mmol/L   Comment NOTIFIED PHYSICIAN   Urinalysis, Routine w reflex microscopic (not at [facility])     Status: Abnormal   Collection Time: 12/04/2015 12:00 PM  Result Value Ref Range   Color, Urine  AMBER (A) YELLOW    Comment: BIOCHEMICALS MAY BE AFFECTED BY COLOR   APPearance HAZY (A) CLEAR   Specific Gravity, Urine 1.010 1.005 - 1.030   pH 6.5 5.0 - 8.0   Glucose, UA NEGATIVE NEGATIVE mg/dL   Hgb urine dipstick LARGE (A) NEGATIVE   Bilirubin Urine SMALL (A) NEGATIVE   Ketones, ur NEGATIVE NEGATIVE mg/dL   Protein, ur 100 (A) NEGATIVE mg/dL   Nitrite NEGATIVE NEGATIVE   Leukocytes, UA MODERATE (A) NEGATIVE  Urine microscopic-add on     Status: Abnormal   Collection Time: 12/04/2015 12:00 PM  Result Value Ref Range   Squamous Epithelial / LPF NONE SEEN NONE SEEN   WBC, UA TOO NUMEROUS TO COUNT 0 - 5 WBC/hpf   RBC / HPF TOO NUMEROUS TO COUNT 0 - 5 RBC/hpf   Bacteria, UA MANY (A) NONE SEEN  Type and screen [facility]     Status: None (Preliminary result)   Collection Time: 11/21/2015   2:23 PM  Result Value Ref Range   ABO/RH(D) A POS    Antibody Screen NEG    Sample Expiration 11/28/2015    Unit Number [id number]    Blood Component Type RED CELLS,LR    Unit division 00    Status of Unit ALLOCATED    Transfusion Status OK TO TRANSFUSE    Crossmatch Result Compatible    Unit Number [id number]    Blood Component Type RED CELLS,LR    Unit division 00    Status of Unit ISSUED    Transfusion Status OK TO TRANSFUSE    Crossmatch Result Compatible   Prepare RBC     Status: None   Collection Time: 11/20/2015  2:23 PM  Result Value Ref Range   Order Confirmation ORDER PROCESSED BY BLOOD BANK     Studies/Results:   BRAIN- HEADT  CT Motion artifact present despite repeating. No acute intracranial abnormality. Specifically, no hemorrhage, hydrocephalus, mass lesion, acute infarction, or significant intracranial injury. No acute calvarial abnormality.  CT MAXILLOFACIAL FINDINGS  No evidence of facial or orbital fracture. Rounded mucosal thickening in the right maxillary sinus. Paranasal sinuses and mastoids otherwise clear. No air-fluid levels. Mandible and zygomatic arches intact.  IMPRESSION: No acute intracranial abnormality.  No evidence of facial/ orbital fracture    [place]ewane Timson A. Merlene [name], M.D.  Diplomate, Tax adviser of Psychiatry and Neurology ( Neurology). 12/10/2015, 5:46 PM

## 2015-11-25 NOTE — Progress Notes (Addendum)
CTSP re hypotension. Chart reviewed.  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 He is now hypotensive.  Discussed with family, spouse, and extended family along with RN staff. Family would like to make him CMO at this time.  Will d/c meds, tests, labs. Start morphine drip at low dose, and titrate for comfort measure only.

## 2015-11-25 NOTE — Progress Notes (Signed)
ANTIBIOTIC CONSULT NOTE - INITIAL  Pharmacy Consult for vancomycin and zosyn Indication: rule out sepsis  Allergies  Allergen Reactions  . Remicade [Infliximab]     Dr Karilyn Cota states previous respiratory arrest not related to remicade. Dr Karilyn Cota states remicade not a drug related allergy. 07-07-2013 at 1025 rapid response called to PACU- Patient difficulty breathing after infusion of Remicade infusing. Do NOT Give Remicade!  . Fentanyl And Related Itching    Swelling, Redness Tolerated fentanyl 01/2015  . Alfentanil Itching    Swelling, Redness  . Wellbutrin [Bupropion] Nausea And Vomiting  . Morphine And Related Hives    Patient Measurements: Height:  (170.2 cm) Weight: 143 lb (64.864 kg) IBW/kg (Calculated) : 66.1   Vital Signs: Temp: 98.4 F (36.9 C) (12/12 1343) Temp Source: Rectal (12/12 1343) BP: 100/70 mmHg (12/12 1343) Pulse Rate: 113 (12/12 1343) Intake/Output from previous day:   Intake/Output from this shift: Total I/O In: 30 [Other:30] Out: -   Labs:  Recent Labs  29-Nov-2015 1119  WBC 14.6*  HGB 8.1*  PLT 39*  CREATININE 4.06*   Estimated Creatinine Clearance: 22.4 mL/min (by C-G formula based on Cr of 4.06). No results for input(s): VANCOTROUGH, VANCOPEAK, VANCORANDOM, GENTTROUGH, GENTPEAK, GENTRANDOM, TOBRATROUGH, TOBRAPEAK, TOBRARND, AMIKACINPEAK, AMIKACINTROU, AMIKACIN in the last 72 hours.   Microbiology: No results found for this or any previous visit (from the past 720 hour(s)).  Medical History: Past Medical History  Diagnosis Date  . 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 Crohn's disease with fistula ([facility]) 05/04/2011    both large and small intestinges/notes 11/15/2012  . Hepatomegaly 05/04/2011  . Fatty liver 05/04/2011    "stage III fatty liver fibrosis" (11/15/2012)  . GERD (gastroesophageal reflux disease)   . Chronic liver disease     /notes 11/15/2012  . Pericarditis     [name] 11/15/2012  . Hypertension   . Pneumonia 1977  . Shortness  of breath     "all the time right now" (11/15/2012)  . History of blood transfusion 2004  . History of stomach ulcers   . Duodenal ulcer   . Depression   . Kidney stones     bilaterally/notes 11/15/2012  . Hepatic fibrosis ([facility])     [name] 11/15/2012  . Anemia   . Pericardial effusion 10/29/2012    moderate to large/notes 11/15/2012  . Anxiety   . Hepatitis   . Crohn's disease ([facility])   . ED (erectile dysfunction)   . Cirrhosis ([facility])     secondary to drug effect, +/- fatty liver    Medications:  See medication history Assessment: 39 yo man with complicated GI history to start broad spectrum antibiotics for sepsis.  He appears to be in ARF with SrCr of 4.06.  He is on chronic TPN at home.    Goal of Therapy:  Vancomycin trough level 15-20 mcg/ml  Plan:  Vancomycin 1250 mg IV x 1 now then 1gm IV q48 hours Zosyn 3.375 gm IV X 1 then 2.25 gm IV q6 hours. F/u renal function, cultures and clinical course Will get TPN formula from [facility].  TPN will start in am. F/u TPN labs  Amelianna Meller [place] 2015-11-29,1:50 PM

## 2015-11-25 NOTE — Consult Note (Signed)
Consultation Note Date: 12/22/2015   Patient Name: David [name]  DOB: April 10, 1976  MRN: [id number]  Age / Sex: 39 y.o., male   PCP: Merlyn Albert, MD Referring Physician: Starleen Arms, MD  Reason for Consultation: Establishing goals of care and Psychosocial/spiritual support  Palliative Care Assessment and Plan Summary of Established Goals of Care and Medical Treatment Preferences   Clinical Assessment/Narrative: David [name] is resting in bed with his eyes open.  He does not make eye contact or respond to my questions. His wife, mother, father, and brother, along with extended family are at bedside.  We talk about his acute illness, kidney injury, infection, and the treatment plan of IV fluids and antibiotics.  I share my worry regarding his strength and ability to recover. I share that we will know more after 12-24 hours, regarding the course this illness with take.    We talk about s/s of pain, and pain medication.  We talk about increased HR and RR being indicators of pain, but that his blood pressure is low and therefore it is difficult to control pain/ and keep blood pressure up.  Mrs. Arriaga tells [place] that he does breath like this at night while he sleeps, but is worse now.  She also states that he is known to have low bp. I share that we are doing all we can for him, and that Lc is doing all he can also.   Mrs. [name] tells [place] that he disconnected his dilaudid infusion and TPN yesterday, (she thought for central line dressing change), and was saying "Last day, last day".   I meet with mother and father after David [name] has left to care for her children, (son's birthday is today).  I share my concerns and encourage them to stay if they wish.   They tell me that they believe his time is short as he has told them that he is tired of living like this.   I encourage David [name] to let his son know that he will care for his wife and children.  I share that it is difficult to know a time  frame, especially with his young age, but that his time is likely close.  I will continue to offer support to this family.   Contacts/Participants in Discussion: Primary Decision Maker: David [name] is unable to make health care decisions at this time.   HCPOA: Unsure, wife David [name] at bedside along with mother, father, brother, and extended family.   Code Status/Advance Care Planning:  DNR  Mrs. Bures tells me that she is not willing to transition to comfort care at this time. We talk about having a clearer path after labs in the morning.   We talk about continuing care, but no extraordinary measures.   Symptom Management:   Dilaudid 0.5 mg IV Q 1 hour PRN.   Palliative Prophylaxis: None at this time.   Psycho-social/Spiritual:   Support System: Lives with wife, 26 yo son, and 21 yo daughter.  Large supportive family. Has hospital bed at home.   Desire for further Chaplaincy support: Ongoing.   Prognosis: Unable to determine, likely hours to days.   Discharge Planning:  Based on outcomes. eval in 24-48 hours.        Chief Complaint:  Altered Mental Status History of Present Illness: David [name] is a 39 y.o. male, With history of Crohn's disease with small bowel obstruction, on TPN for a year, advanced liver cirrhosis, severe malnutrition secondary to chronic illness,  abdominal pain, dependent on the lower lid PCA, anemia of chronic disease, patient brought for altered mental status, patient can't provide any history, parents at bedside to provide history of present illness, they report over last 24-48 hour, patient has been more weak, fatigued, more lethargic, yesterday he stopped his TPN, as well stopped his Dilaudid PCA, patient was found this a.m. by his mother on the floor laying increases, last seen earlier in the day by his wife, in ED workup was significant for acute renal failure with a creatinine of 4, Leukocytosis 14.6, hemoglobin 8.1, and thrombocytopenia of 3.9, patient  was unresponsive, noticed to have brief episode of seizures in ED, sepsis protocol was started, on IV fluids, patient was noticed to have a brief episode of twitching last seizure like activity in ED ,I was called to admit.   Primary Diagnoses  Present on Admission:  . Sepsis (HCC) . 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 GERD (gastroesophageal reflux disease) . Hepatic fibrosis (HCC) . Liver cirrhosis (HCC) . Symptomatic anemia . Adult failure to thrive . Severe dehydration . Acute renal failure syndrome (HCC)  Palliative Review of Systems: David [name] is unable to participate in ROS dt lethargy.  I have reviewed the medical record, interviewed the patient and family, and examined the patient. The following aspects are pertinent.  Past Medical History  Diagnosis Date  . 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 Crohn's disease with fistula (HCC) 05/04/2011    both large and small intestinges/notes 11/15/2012  . Hepatomegaly 05/04/2011  . Fatty liver 05/04/2011    "stage III fatty liver fibrosis" (11/15/2012)  . GERD (gastroesophageal reflux disease)   . Chronic liver disease     /notes 11/15/2012  . Pericarditis     [name] 11/15/2012  . Hypertension   . Pneumonia 1977  . Shortness of breath     "all the time right now" (11/15/2012)  . History of blood transfusion 2004  . History of stomach ulcers   . Duodenal ulcer   . Depression   . Kidney stones     bilaterally/notes 11/15/2012  . Hepatic fibrosis (HCC)     [name] 11/15/2012  . Anemia   . Pericardial effusion 10/29/2012    moderate to large/notes 11/15/2012  . Anxiety   . Hepatitis   . Crohn's disease (HCC)   . ED (erectile dysfunction)   . Cirrhosis (HCC)     secondary to drug effect, +/- fatty liver   Social History   Social History  . Marital Status: Married    Spouse Name: N/A  . Number of Children: 2  . Years of Education: N/A   Occupational History  . disabled/retired    Social History  Main Topics  . Smoking status: Former Smoker -- 0.50 packs/day for 24 years    Types: Cigarettes    Start date: 10/28/2012    Quit date: 11/22/2012  . Smokeless tobacco: Current User    Types: Snuff     Comment: 1/2 pack a month  . Alcohol Use: No  . Drug Use: No  . Sexual Activity: Not Currently   Other Topics Concern  . None   Social History Narrative   Crohns h/o      This is a 39 y.o. year old male with Crohn's disease. He had symptoms of abdominal pain since he was 14 but was diagnosed in 2002-2004. He had an EGD at some point that showed esophageal, gastric and antral ulcers. It is not  clear if this was related to Crohn's or peptic. He was treated with acid suppressive therapy without much luck so he had another EGD which was complicated by duodenal perforation that required surgical repair/gastrojejunostomy. His post-op course was complicated by wound infection. At some point he was diagnosed with ileocolonic Crohn's disease. Historically he was treated with oral 5 ASA, prednisone, Imuran, MTX and anti-TNF therapy. He started with Remicade and was on it for 2 months without improvement so it was stopped and patient was switched to 6 MP. While on the 6 MP he had to be on steroids most of the time so 6 MP was discontinued. He had no side effects with the 6 MP. Around that time he was treated with MTX weekly injection, he said on and off for few years. He tells me that after that he tried Humira 6-12 months and Cimzia 6-12 month without prolonged response. It always seemed that he has initial response to the therapy that quickly wears off. He was re-challenged with Remicade around 2008 and stayed on it since then. His dose and dosing interval had to be changed multiple times due to clinical symptoms. 2-3 months ago he had an anaphylactic reactions to Remicade x 2 despite that he was pre-medicated with 200 mg of hydrocortisone. His Remicade was stopped. No Remicade level or antibody was checked.  It is not clear why he was not on IMM while on Remicade.       About 1-2 years he was diagnosed with cirrhosis, he tells me it's due to MTX although he was on weekly injections and only for few years. There is mention of possible NASH as an etiology seen on liver bx in the reviewed notes. He is followed at [facility] for his cirrhosis, his complications are mainly ascites and edema. He also has CRI. Multiple imaging studies including MRE and CTE showing active disease in TI. TPMT normal October 2014. EGD in 1/14 showed esophagitis, gastritis, PHG and gastrojejunostomy. 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 Diabetes Mother   . Diabetes Brother   . Healthy Daughter   . Healthy Son   . Colon cancer Neg Hx    Scheduled Meds: . sodium chloride   Intravenous STAT  . antiseptic oral rinse  7 mL Mouth Rinse q12n4p  . chlorhexidine  15 mL Mouth Rinse BID  . levETIRAcetam  500 mg Intravenous Q12H  . pantoprazole (PROTONIX) IV  40 mg Intravenous Q12H  . piperacillin-tazobactam (ZOSYN) IVPB  2.25 g Intravenous Q6H  . sodium chloride  3 mL Intravenous Q12H  . [START ON 11/27/2015] vancomycin  1,000 mg Intravenous Q48H   Continuous Infusions: . sodium chloride 100 mL/hr at 12/04/2015 1507   PRN Meds:.HYDROmorphone (DILAUDID) injection, LORazepam, ondansetron (ZOFRAN) IV Medications Prior to Admission:  Prior to Admission medications   Medication Sig Start Date End Date Taking? Authorizing Provider  cloNIDine (CATAPRES - DOSED IN MG/24 HR) 0.1 mg/24hr patch Place 0.1 mg onto the skin once a week.  09/17/15   Historical Provider, MD  dextrose 5 % solution  10/10/15   Historical Provider,  MD  dicyclomine (BENTYL) 10 MG capsule Take 1 capsule (10 mg total) by  mouth 3 (three) times daily as needed (stomach cramps). 06/07/15   Len Blalock, NP  ferrous gluconate (FERGON) 324 MG tablet Take 324 mg by mouth 3 (three) times daily. 11/21/14   Historical Provider, MD  fish oil-omega-3 fatty acids 1000 MG capsule Take 1 g by mouth 3 (three) times daily. 02/09/12   Malissa Hippo, MD  Gabapentin 300 MG/6ML SOLN Take 12 mLs by mouth 3 (three) times daily. 09/17/15 10/17/15  Historical Provider, MD  HYDROmorphone (DILAUDID) 500 MG/50ML SOLN injection 500 mg. Patient states that he pushes the button ever 10 minutes. 09/23/15   Historical Provider, MD  metolazone (ZAROXOLYN) 5 MG tablet Take 1 tablet (5 mg total) by mouth 2 (two) times daily. 01/01/15   Erick Blinks, MD  nystatin cream (MYCOSTATIN) Apply 1 application topically 3 (three) times daily as needed for dry skin. Use as directed 04/30/15   Malissa Hippo, MD  pantoprazole (PROTONIX) 40 MG tablet Take 1 tablet (40 mg total) by mouth 2 (two) times daily. 06/18/14   Malissa Hippo, MD  PAXIL 10 MG/5ML suspension Take 10 mg by mouth daily.  07/10/15   Historical Provider, MD  pentamidine (PENTAM) 300 MG inhalation solution Inhale 300 mg into the lungs every 28 (twenty-eight) days. 09/28/15   Historical Provider, MD  polyethylene glycol (MIRALAX / GLYCOLAX) packet Take 17 g by mouth daily as needed for mild constipation or moderate constipation.  10/26/14   Historical Provider, MD  potassium chloride 20 MEQ/15ML (10%) SOLN Take 30 mLs (40 mEq total) by mouth 3 (three) times daily. 01/01/15   Erick Blinks, MD  Probiotic Product (ALIGN) 4 MG CAPS Take 4 mg by mouth daily.     Historical Provider, MD  promethazine (PHENERGAN) 25 MG tablet Take 1 tablet (25 mg total) by mouth 2 (two) times daily as needed. nausea 11/27/14   Malissa Hippo, MD  propranolol (INDERAL) 10 MG tablet Take 10 mg by mouth 2 (two) times daily.  08/06/15   Historical Provider,  MD  sodium chloride 0.9 % infusion as needed.  08/07/15   Historical Provider, MD  sucralfate (CARAFATE) 1 G tablet Take 1 g by mouth 3 (three) times daily before meals.    Historical Provider, MD   Allergies  Allergen Reactions  . Remicade [Infliximab]     Dr Karilyn Cota states previous respiratory arrest not related to remicade. Dr Karilyn Cota states remicade not a drug related allergy. 07-07-2013 at 1025 rapid response called to PACU- Patient difficulty breathing after infusion of Remicade infusing. Do NOT Give Remicade!  . Fentanyl And Related Itching    Swelling, Redness Tolerated fentanyl 01/2015  . Alfentanil Itching    Swelling, Redness  . Wellbutrin [Bupropion] Nausea And Vomiting  . Morphine And Related Hives   CBC:    Component Value Date/Time   WBC 14.6* 11/29/2015 1119   HGB 8.1* 11/18/2015 1119   HCT 23.2* 11/27/2015 1119   PLT 39* 11/16/2015 1119   MCV 88.5 11/29/2015 1119   NEUTROABS 12.3* 12/06/2015 1119   LYMPHSABS 1.3 12/06/2015 1119   MONOABS 0.9 11/23/2015 1119   EOSABS 0.1 11/18/2015 1119   BASOSABS 0.0 12/06/2015 1119   Comprehensive Metabolic Panel:    Component Value Date/Time   NA 132* 11/22/2015 1119   K 3.5 12/14/2015 1119   CL 91* 12/07/2015 1119   CO2 30 12/03/2015 1119   BUN 110* 12/04/2015 1119   CREATININE 4.06* Nov 26, 2015 1119   CREATININE 1.62* 12/04/2014 1222   GLUCOSE  111* 21-Dec-2015 1119   CALCIUM 7.1* 2015/12/21 1119   AST 44* Dec 21, 2015 1119   ALT 20 December 21, 2015 1119   ALKPHOS 177* Dec 21, 2015 1119   BILITOT 1.7* Dec 21, 2015 1119   PROT 6.4* Dec 21, 2015 1119   ALBUMIN 2.0* 12/21/15 1119    Physical Exam: Vital Signs: BP 87/57 mmHg  Pulse 124  Temp(Src) 98 F (36.7 C) (Axillary)  Resp 37  Ht 5\' 10"  (1.778 m)  Wt 67.4 kg (148 lb 9.4 oz)  BMI 21.32 kg/m2  SpO2 99% SpO2: SpO2: 99 % O2 Device: O2 Device: Nasal Cannula O2 Flow Rate: O2 Flow Rate (L/min): 2 L/min Intake/output summary:  Intake/Output Summary (Last 24 hours) at  Dec 21, 2015 1911 Last data filed at 2015-12-21 1800  Gross per 24 hour  Intake 1003.33 ml  Output      0 ml  Net 1003.33 ml   LBM: Last BM Date: 21-Dec-2015 Baseline Weight: Weight: 64.864 kg (143 lb) Most recent weight: Weight: 67.4 kg (148 lb 9.4 oz)  Exam Findings:  Constitutional:  Chronically ill appearing, frail, thin.  Unable to determine orientation.      Cardiovascular: sinus rhythm, rate 120's.   Pulmonary/Chest: WOB noted. Effort normal and breath sounds normal. He has no wheezes.   Abdominal: Firm, rounded, stoma RUQ draining dark liquid.  Musculoskeletal: Muscle wasting.   Nursing note and vitals reviewed.         Palliative Performance Scale: 10%              Additional Data Reviewed: Recent Labs     12-21-2015  1119  WBC  14.6*  HGB  8.1*  PLT  39*  NA  132*  BUN  110*  CREATININE  4.06*     Time In: 1455 Time Out: 1615 Time Total: 80 minutes Greater than 50%  of this time was spent counseling and coordinating care related to the above assessment and plan.  Signed by: Katheran Awe, NP  Katheran Awe, NP  21-Dec-2015, 7:11 PM  Please contact Palliative Medicine Team phone at [phone] for questions and concerns.

## 2015-11-25 NOTE — Progress Notes (Signed)
Present with family and patient for support.  

## 2015-11-25 NOTE — Progress Notes (Signed)
Will start on prn dilaudid to prevent withdrawel.

## 2015-11-25 NOTE — ED Notes (Signed)
Bladder scan performed with 37 ml in bladder

## 2015-11-26 ENCOUNTER — Telehealth: Payer: Self-pay | Admitting: Family Medicine

## 2015-11-26 DIAGNOSIS — A419 Sepsis, unspecified organism: Secondary | ICD-10-CM

## 2015-11-26 DIAGNOSIS — N179 Acute kidney failure, unspecified: Secondary | ICD-10-CM

## 2015-11-26 DIAGNOSIS — K50813 Crohn's disease of both small and large intestine with fistula: Secondary | ICD-10-CM

## 2015-11-26 DIAGNOSIS — A414 Sepsis due to anaerobes: Secondary | ICD-10-CM

## 2015-11-26 DIAGNOSIS — K740 Hepatic fibrosis: Secondary | ICD-10-CM

## 2015-11-26 LAB — TYPE AND SCREEN
ABO/RH(D): A POS
Antibody Screen: NEGATIVE
Unit division: 0
Unit division: 0

## 2015-11-26 MED ORDER — SODIUM BICARBONATE 8.4 % IV SOLN
25.0000 meq | Freq: Once | INTRAVENOUS | Status: AC
  Administered 2015-11-26: 50 meq via INTRAVENOUS
  Filled 2015-11-26: qty 50

## 2015-11-26 MED ORDER — ATROPINE SULFATE 1 % OP SOLN
1.0000 [drp] | Freq: Four times a day (QID) | OPHTHALMIC | Status: DC
  Administered 2015-11-26: 1 [drp] via SUBLINGUAL
  Filled 2015-11-26: qty 2

## 2015-11-26 MED ORDER — SODIUM CHLORIDE 0.9 % IV SOLN
1.0000 mg/h | INTRAVENOUS | Status: DC
  Administered 2015-11-26: 4 mg/h via INTRAVENOUS
  Administered 2015-11-26: 0.5 mg/h via INTRAVENOUS
  Filled 2015-11-26 (×2): qty 2.5

## 2015-11-26 MED ORDER — LORAZEPAM 2 MG/ML IJ SOLN
1.0000 mg | INTRAMUSCULAR | Status: DC | PRN
  Administered 2015-11-26 (×3): 1 mg via INTRAVENOUS
  Filled 2015-11-26 (×3): qty 1

## 2015-11-26 MED ORDER — HYDROMORPHONE HCL 1 MG/ML IJ SOLN
1.0000 mg | INTRAMUSCULAR | Status: DC | PRN
  Administered 2015-11-26 (×4): 1 mg via INTRAVENOUS

## 2015-11-26 MED ORDER — HYDROMORPHONE HCL PF 10 MG/ML IJ SOLN
INTRAMUSCULAR | Status: AC
  Filled 2015-11-26: qty 5

## 2015-11-27 LAB — URINE CULTURE: Culture: >=100000

## 2015-11-28 DIAGNOSIS — A414 Sepsis due to anaerobes: Secondary | ICD-10-CM

## 2015-11-28 LAB — CULTURE, BLOOD (ROUTINE X 2)

## 2015-12-15 NOTE — Progress Notes (Signed)
Supportive presence continuing for family. His children and his wife were together with him alone for an extended time this afternoon. Family members continue to come to be with him. Supportive family. Their pastor visits also.

## 2015-12-15 NOTE — Progress Notes (Signed)
Patient Demographics  David [name], is a 40 y.o. male, DOB - Jan 13, 1976, NFA:[id number]  Admit date - 12/09/2015   Admitting Physician Starleen Arms, MD  Outpatient Primary MD for the patient is Lubertha South, MD  LOS - 1   Chief Complaint  Patient presents with  . Altered Mental Status       Admission HPI/Brief narrative: 40 y.o. Male with history of Crohn's disease with small bowel obstruction, on TPN , liver cirrhosis, severe malnutrition secondary to chronic illness, abdominal pain,on dilaudid PCA, anemia of chronic disease, presents with sepsis, coma, work significant for sever sepsis, anemia, encephalopathy, who treated with broad-spectrum antibiotic, transfusions, aggressive IV hydration , without significant improvement, patient is not on a waiting list for combined liver and small bowel transplant anymore, patient was seen by palliative care ,given overall poor prognosis , and life quality, therefore plan was to continue for comfort measures .  Subjective:   David [name] is comatose, and provide any complaints.  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 Encephalopathy/coma  Acute renal failure Multifactorial anemia Liver cirrhosis/Crohn's disease Severe protein calorie malnutrition/dehydration Anemia GERD  Elevated LFTs  Thrombocytopenia  Goals of care /end-of-life: -  He has overall very poor prognosis, poor life  quality, and by palliative care, GI, neurology , at this point patient is transitioned to comfort care measures only, on Dilaudid drip, when necessary Ativan, this is most managed by palliative care , discussed with wife at bedside, father at bedside, all their questions were answered .  Code Status:DNR/Comfort care.  Family Communication: Wife at bedside  Disposition Plan: death expected during hospital stay.   Procedures  none   Consults   Gastroenterology Palliative Neurology   Medications  Scheduled Meds: . atropine  1 drop Sublingual QID  . pantoprazole (PROTONIX) IV  40 mg Intravenous Q12H  . piperacillin-tazobactam (ZOSYN) IVPB  2.25 g Intravenous Q6H  . sodium chloride  3 mL Intravenous Q12H   Continuous Infusions: . HYDROmorphone 2 mg/hr (11/22/2015 0950)   PRN Meds:.HYDROmorphone (DILAUDID) injection, LORazepam   Lab Results  Component Value Date   PLT 39* November 26, 2015    Antibiotics   Anti-infectives    Start     Dose/Rate Route Frequency Ordered Stop   11/27/15 1400  vancomycin (VANCOCIN) IVPB 1000 mg/200 mL premix  Status:  Discontinued     1,000 mg 200 mL/hr over 60 Minutes Intravenous Every 48 hours 11/22/2015 1357 12/12/2015 0006   Nov 26, 2015 2000  piperacillin-tazobactam (ZOSYN) 2.25 g in dextrose 5 % 50 mL IVPB     2.25 g 100 mL/hr over 30 Minutes Intravenous Every 6 hours November 26, 2015 1357     11/19/2015 1345  piperacillin-tazobactam (ZOSYN) IVPB 3.375 g  3.375 g 100 mL/hr over 30 Minutes Intravenous  Once 12/01/2015 1331 11/15/2015 1549   12/10/2015 1345  vancomycin (VANCOCIN) 1,250 mg in sodium chloride 0.9 % 250 mL IVPB     1,250 mg 166.7 mL/hr over 90 Minutes Intravenous  Once 11/16/2015 1331 11/21/2015 1632   11/29/2015 1230  cefTRIAXone (ROCEPHIN) 1 g in dextrose 5 % 50 mL IVPB     1 g 100 mL/hr over 30 Minutes Intravenous  Once 11/30/2015 1216 12/11/2015 1317          Objective:   Filed Vitals:   2015-12-18 0500 2015-12-18 0600 12-18-2015 0700 12/18/15 0800    BP: 97/66 93/64 95/69  101/70  Pulse: 107 105 110 108  Temp:      TempSrc:      Resp: 41 38 35 32  Height:      Weight:      SpO2: 93% 92% 92% 95%    Wt Readings from Last 3 Encounters:  12/02/2015 67.4 kg (148 lb 9.4 oz)  11/12/15 65.046 kg (143 lb 6.4 oz)  10/21/15 74.844 kg (165 lb)     Intake/Output Summary (Last 24 hours) at 2015-12-18 1003 Last data filed at 12-18-2015 0600  Gross per 24 hour  Intake 2097.38 ml  Output   1300 ml  Net 797.38 ml     Physical Exam  Cachectic , ill-appearing , unresponsive, comatose  Symmetrical Chest wall moto breathing, tachypneic. S1 S2 + Distended, diminished bowel sounds, Aiken  pouch +.  edema+, no cyanosis   Data Review   Micro Results Recent Results (from the past 240 hour(s))  Culture, blood (routine x 2)     Status: None (Preliminary result)   Collection Time: 12/11/2015 11:41 AM  Result Value Ref Range Status   Specimen Description BLOOD RIGHT HAND  Final   Special Requests BOTTLES DRAWN AEROBIC ONLY 5CC  Final   Culture  Setup Time   Final    GRAM NEGATIVE RODS Gram Stain Report Called to,Read Back By and Verified With: MCDANIEL,M AT 0122 ON 12-18-15 BY WOODS,M    Culture PENDING  Incomplete   Report Status PENDING  Incomplete  Culture, blood (routine x 2)     Status: None (Preliminary result)   Collection Time: 12/08/2015 11:50 AM  Result Value Ref Range Status   Specimen Description BLOOD LEFT HAND  Final   Special Requests BOTTLES DRAWN AEROBIC ONLY 5CC  Final   Culture  Setup Time   Final    GRAM NEGATIVE RODS Gram Stain Report Called to,Read Back By and Verified With: MCDANIEL,M AT 0122 ON 12-18-2015 BY WOODS, M    Culture PENDING  Incomplete   Report Status PENDING  Incomplete  MRSA PCR Screening     Status: Abnormal   Collection Time: 12/08/2015  3:30 PM  Result Value Ref Range Status   MRSA by PCR POSITIVE (A) NEGATIVE Final    Comment:        The GeneXpert MRSA Assay (FDA approved for NASAL  specimens only), is one component of a comprehensive MRSA colonization surveillance program. It is not intended to diagnose MRSA infection nor to guide or monitor treatment for MRSA infections. CRITICAL RESULT CALLED TO, READ BACK BY AND VERIFIED WITH: MARTIN,S ON 11/22/2015 AT 2255 BY LOY,C     Radiology Reports Ct Head Wo Contrast  12/05/2015  CLINICAL DATA:  Unwitnessed fall at home. Decreased level of consciousness. EXAM: CT HEAD WITHOUT CONTRAST CT MAXILLOFACIAL WITHOUT CONTRAST TECHNIQUE: Multidetector CT imaging of the  head and maxillofacial structures were performed using the standard protocol without intravenous contrast. Multiplanar CT image reconstructions of the maxillofacial structures were also generated. COMPARISON:  None. FINDINGS: CT HEAD FINDINGS Motion artifact present despite repeating. No acute intracranial abnormality. Specifically, no hemorrhage, hydrocephalus, mass lesion, acute infarction, or significant intracranial injury. No acute calvarial abnormality. CT MAXILLOFACIAL FINDINGS No evidence of facial or orbital fracture. Rounded mucosal thickening in the right maxillary sinus. Paranasal sinuses and mastoids otherwise clear. No air-fluid levels. Mandible and zygomatic arches intact. IMPRESSION: No acute intracranial abnormality. No evidence of facial/ orbital fracture. Electronically Signed   By: Charlett Nose M.D.   On: Dec 20, 2015 12:34   Dg Abd Acute W/chest  12/20/2015  CLINICAL DATA:  Abdominal pain. EXAM: DG ABDOMEN ACUTE W/ 1V CHEST COMPARISON:  Chest x-ray 10/04/2015 FINDINGS: Low lung volumes. There is mild cardiomegaly. No confluent airspace opacity. Right central line tip is at the cavoatrial junction. No effusion. Prior cholecystectomy. Nonobstructive bowel gas pattern. No free air organomegaly. No acute bony abnormality. IMPRESSION: No evidence of bowel obstruction or free air. Cardiomegaly.  Low lung volumes.  No acute cardiopulmonary disease. Electronically  Signed   By: Charlett Nose M.D.   On: 20-Dec-2015 12:25   Ct Maxillofacial Wo Cm  20-Dec-2015  CLINICAL DATA:  Unwitnessed fall at home. Decreased level of consciousness. EXAM: CT HEAD WITHOUT CONTRAST CT MAXILLOFACIAL WITHOUT CONTRAST TECHNIQUE: Multidetector CT imaging of the head and maxillofacial structures were performed using the standard protocol without intravenous contrast. Multiplanar CT image reconstructions of the maxillofacial structures were also generated. COMPARISON:  None. FINDINGS: CT HEAD FINDINGS Motion artifact present despite repeating. No acute intracranial abnormality. Specifically, no hemorrhage, hydrocephalus, mass lesion, acute infarction, or significant intracranial injury. No acute calvarial abnormality. CT MAXILLOFACIAL FINDINGS No evidence of facial or orbital fracture. Rounded mucosal thickening in the right maxillary sinus. Paranasal sinuses and mastoids otherwise clear. No air-fluid levels. Mandible and zygomatic arches intact. IMPRESSION: No acute intracranial abnormality. No evidence of facial/ orbital fracture. Electronically Signed   By: Charlett Nose M.D.   On: 20-Dec-2015 12:34     CBC  Recent Labs Lab 20-Dec-2015 1119  WBC 14.6*  HGB 8.1*  HCT 23.2*  PLT 39*  MCV 88.5  MCH 30.9  MCHC 34.9  RDW 17.4*  LYMPHSABS 1.3  MONOABS 0.9  EOSABS 0.1  BASOSABS 0.0    Chemistries   Recent Labs Lab 2015/12/20 1119  NA 132*  K 3.5  CL 91*  CO2 30  GLUCOSE 111*  BUN 110*  CREATININE 4.06*  CALCIUM 7.1*  MG 2.1  AST 44*  ALT 20  ALKPHOS 177*  BILITOT 1.7*   ------------------------------------------------------------------------------------------------------------------ estimated creatinine clearance is 23.3 mL/min (by C-G formula based on Cr of 4.06). ------------------------------------------------------------------------------------------------------------------ No results for input(s): HGBA1C in the last 72  hours. ------------------------------------------------------------------------------------------------------------------ No results for input(s): CHOL, HDL, LDLCALC, TRIG, CHOLHDL, LDLDIRECT in the last 72 hours. ------------------------------------------------------------------------------------------------------------------ No results for input(s): TSH, T4TOTAL, T3FREE, THYROIDAB in the last 72 hours.  Invalid input(s): FREET3 ------------------------------------------------------------------------------------------------------------------ No results for input(s): VITAMINB12, FOLATE, FERRITIN, TIBC, IRON, RETICCTPCT in the last 72 hours.  Coagulation profile No results for input(s): INR, PROTIME in the last 168 hours.  No results for input(s): DDIMER in the last 72 hours.  Cardiac Enzymes No results for input(s): CKMB, TROPONINI, MYOGLOBIN in the last 168 hours.  Invalid input(s): CK ------------------------------------------------------------------------------------------------------------------ Invalid input(s): POCBNP     Time Spent in minutes   20 minutes   Richardo Popoff M.D on  12/07/2015 at 10:03 AM  Between 7am to 7pm - Pager - [phone]  After 7pm go to [email or web address] - password [facility]  [facility]   Office  [phone]

## 2015-12-15 NOTE — Progress Notes (Signed)
Daily Progress Note   Patient Name: David [name]       Date: 11/21/2015 DOB: Dec 09, 1976  Age: 40 y.o. MRN#: [id number] Attending Physician: Starleen Arms, MD Primary Care Physician: Lubertha South, MD Admit Date: 2015-12-10  Reason for Consultation/Follow-up: Psychosocial/spiritual support and Terminal Care  Subjective: David [name] is resting with his family at his bedside.  He has been transitioned to comfort care, with dilaudid infusion.  He is non responsive, but has his eyes open.  Support is given to family and nursing staff.    Length of Stay: 1 day  Current Medications: Scheduled Meds:  . atropine  1 drop Sublingual QID  . sodium chloride  3 mL Intravenous Q12H    Continuous Infusions: . HYDROmorphone 2.5 mg/hr (11/22/2015 1029)    PRN Meds: HYDROmorphone (DILAUDID) injection, LORazepam  Palliative Performance Scale: 10%     Vital Signs: BP 101/70 mmHg  Pulse 108  Temp(Src) 98.4 F (36.9 C) (Axillary)  Resp 32  Ht  (1.778 m)  Wt 67.4 kg (148 lb 9.4 oz)  BMI 21.32 kg/m2  SpO2 95% SpO2: SpO2: 95 % O2 Device: O2 Device: NRB O2 Flow Rate: O2 Flow Rate (L/min): 15 L/min  Intake/output summary:  Intake/Output Summary (Last 24 hours) at 12/07/2015 1050 Last data filed at 12/07/2015 0600  Gross per 24 hour  Intake 2097.38 ml  Output   1300 ml  Net 797.38 ml   LBM:   Baseline Weight: Weight: 64.864 kg (143 lb) Most recent weight: Weight: 67.4 kg (148 lb 9.4 oz)  Physical Exam: Constitutional: frail, appears to be dying.  Resp: work of breathing noted, dilaudid infusion titrated PRN              Additional Data Reviewed: Recent Labs     December 10, 2015  1119  WBC  14.6*  HGB  8.1*  PLT  39*  NA  132*  BUN  110*  CREATININE  4.06*     Problem List:  Patient Active Problem List   Diagnosis Date Noted  . Sepsis (HCC) December 10, 2015  . Pressure ulcer 12/10/2015  . Acute renal failure (HCC)   . Palliative care encounter   . DNR (do not  resuscitate) discussion   . Hypokalemia   . Acute renal failure syndrome (HCC)   . Candida glabrata infection   . Candida parapsilosis infection   . Candidemia (HCC)   . Hemodialysis catheter infection (HCC)   . Central line infection   . Acute respiratory disease   . Bilateral pneumonia   . Severe sepsis (HCC)   . AKI (acute kidney injury) (HCC)   . Septic shock (HCC)   . Acute respiratory failure (HCC)   . ARDS (adult respiratory distress syndrome) (HCC)   . Fungemia   . Acute kidney injury (HCC) 01/16/2015  . Acute respiratory failure with hypoxia (HCC) 01/16/2015  . Thrombocytopenia (HCC) 01/16/2015  . Pneumonia 01/16/2015  . Endotracheally intubated   . Healthcare-associated pneumonia 01/15/2015  . Abdominal pain, generalized   . Infection   . Anasarca   . Anemia of chronic disease   . Aspiration pneumonia (HCC) 12/15/2014  . Aspiration into airway 12/15/2014  . Other cirrhosis of liver (HCC)   . Pain   . Protein-calorie malnutrition, severe (HCC) 12/11/2014  . Crohn's disease (HCC)   . Crohn's disease of both small and large intestine with complication (HCC)   . Hyponatremia 12/09/2014  . Symptomatic anemia 12/09/2014  . Leukocytosis 12/09/2014  . Adult  failure to thrive 12/09/2014  . Severe dehydration 12/09/2014  . Gastroparesis 03/10/2014  . Hyperglycemia 03/09/2014  . Hematuria, microscopic 03/08/2014  . Pneumonitis 03/08/2014  . HCAP (healthcare-associated pneumonia) 03/08/2014  . Abdominal pain 03/07/2014  . Liver cirrhosis (HCC) 03/07/2014  . Ascites 04/13/2013  . ARF (acute renal failure) (HCC) 04/11/2013  . Bilateral leg edema 12/28/2012  . Pleural effusion 11/19/2012  . Long-term use of immunosuppressant medication 11/16/2012  . Pericarditis with effusion 10/31/2012  . Fluid overload 09/06/2012  . Anemia, iron deficiency 02/09/2012  . 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 GERD (gastroesophageal reflux  disease) 11/30/2011  . Hepatic fibrosis (HCC) 11/30/2011  . Chronic right lower quadrant pain 11/30/2011  . Bilateral kidney stones 11/30/2011  . Hypertension 11/30/2011     Palliative Care Assessment & Plan    Code Status:  DNR  Goals of Care:  Comfort measures.   Symptom Management:  Dilaudid infusion titrate for comfort  Ativan 1 mg PRN  Palliative Prophylaxis:  None needed.   Psycho-social/Spiritual:  Desire for further Chaplaincy support: Ongoing    Prognosis: Hours - Days Discharge Planning: Anticipated Hospital Death   Care plan was discussed with nursing staff, CM, NP Sams, and Dr. Randol Kern.   Thank you for allowing the Palliative Medicine Team to assist in the care of this patient.   Time In: 0930 Time Out: 0950 Total Time 20 minutes Prolonged Time Billed  no     Greater than 50%  of this time was spent counseling and coordinating care related to the above assessment and plan.   Katheran Awe, NP  November 28, 2015, 10:50 AM  Please contact Palliative Medicine Team phone at [phone] for questions and concerns.

## 2015-12-15 NOTE — Telephone Encounter (Signed)
[facility] called as an FYI to inform Dr.Steve Luking that patient was recently admitted to [facility] and is currently on comfort measures.

## 2015-12-15 NOTE — Discharge Summary (Signed)
Brief death summary: Please see dictated progress note by me on 12/13 for more details. Hospital course: 40 y.o. Male with history of Crohn's disease with small bowel obstruction, on TPN , liver cirrhosis, severe malnutrition secondary to chronic illness, abdominal pain,on dilaudid PCA, anemia of chronic disease, presents with sepsis, coma, work significant for sever sepsis, anemia, encephalopathy, who treated with broad-spectrum antibiotic, transfusions, aggressive IV hydration , without significant improvement, patient is not on a waiting list for combined liver and small bowel transplant anymore, patient was seen by palliative care ,given overall poor prognosis , and life quality, therefore plan was to continue for comfort measures , was seen by palliative care, kept on Dilaudid drip, when necessary Ativan for comfort measures, multiple family members were present during hospitalization, is multiple time and answered their questions, patient was seen by neurology, gastroenterology, and palliative care during hospital stay, workup was significant for severe sepsis/bacteremia secondary to ipsilateral pneumonia.  Huey Bienenstock MD

## 2015-12-15 NOTE — Progress Notes (Signed)
Patient seen and condition discussed with several family members who were at bedside including patient's wife making and mother Darel Hong. Patient is comatose. Blood cultures are positive for gram-negative rods. Patient's appointment at [facility] is on 12/23/2015. Patient is not on waiting list for combined liver and small bowel transplant. He was removed from the list because of multiple co-morbidities. Therefore I would agree with supportive therapy and comfort measures.

## 2015-12-15 NOTE — Progress Notes (Signed)
Nutrition Brief Note  Chart reviewed. Pt now transitioning to comfort care.  No further nutrition interventions warranted at this time.  Please re-consult as needed.   Lynn Erinne Gillentine MS,RD,CSG,LDN Office: #[phone] Pager: #[phone]    

## 2015-12-15 DEATH — deceased

## 2015-12-25 ENCOUNTER — Telehealth: Payer: Self-pay | Admitting: Family Medicine

## 2015-12-25 NOTE — Telephone Encounter (Signed)
[employer] sent over claim form for patient's. doctor to fill out. This is on his chart.

## 2015-12-25 NOTE — Telephone Encounter (Signed)
ok 

## 2015-12-31 ENCOUNTER — Ambulatory Visit (INDEPENDENT_AMBULATORY_CARE_PROVIDER_SITE_OTHER): Payer: Self-pay | Admitting: Internal Medicine

## 2016-03-24 IMAGING — DX DG CHEST 2V
2 series · 2 of 2 positions shown · non-contrast
Comparison: 12/26/2014

CLINICAL DATA: Pneumonia

EXAM:
CHEST  2 VIEW

[chest pa]
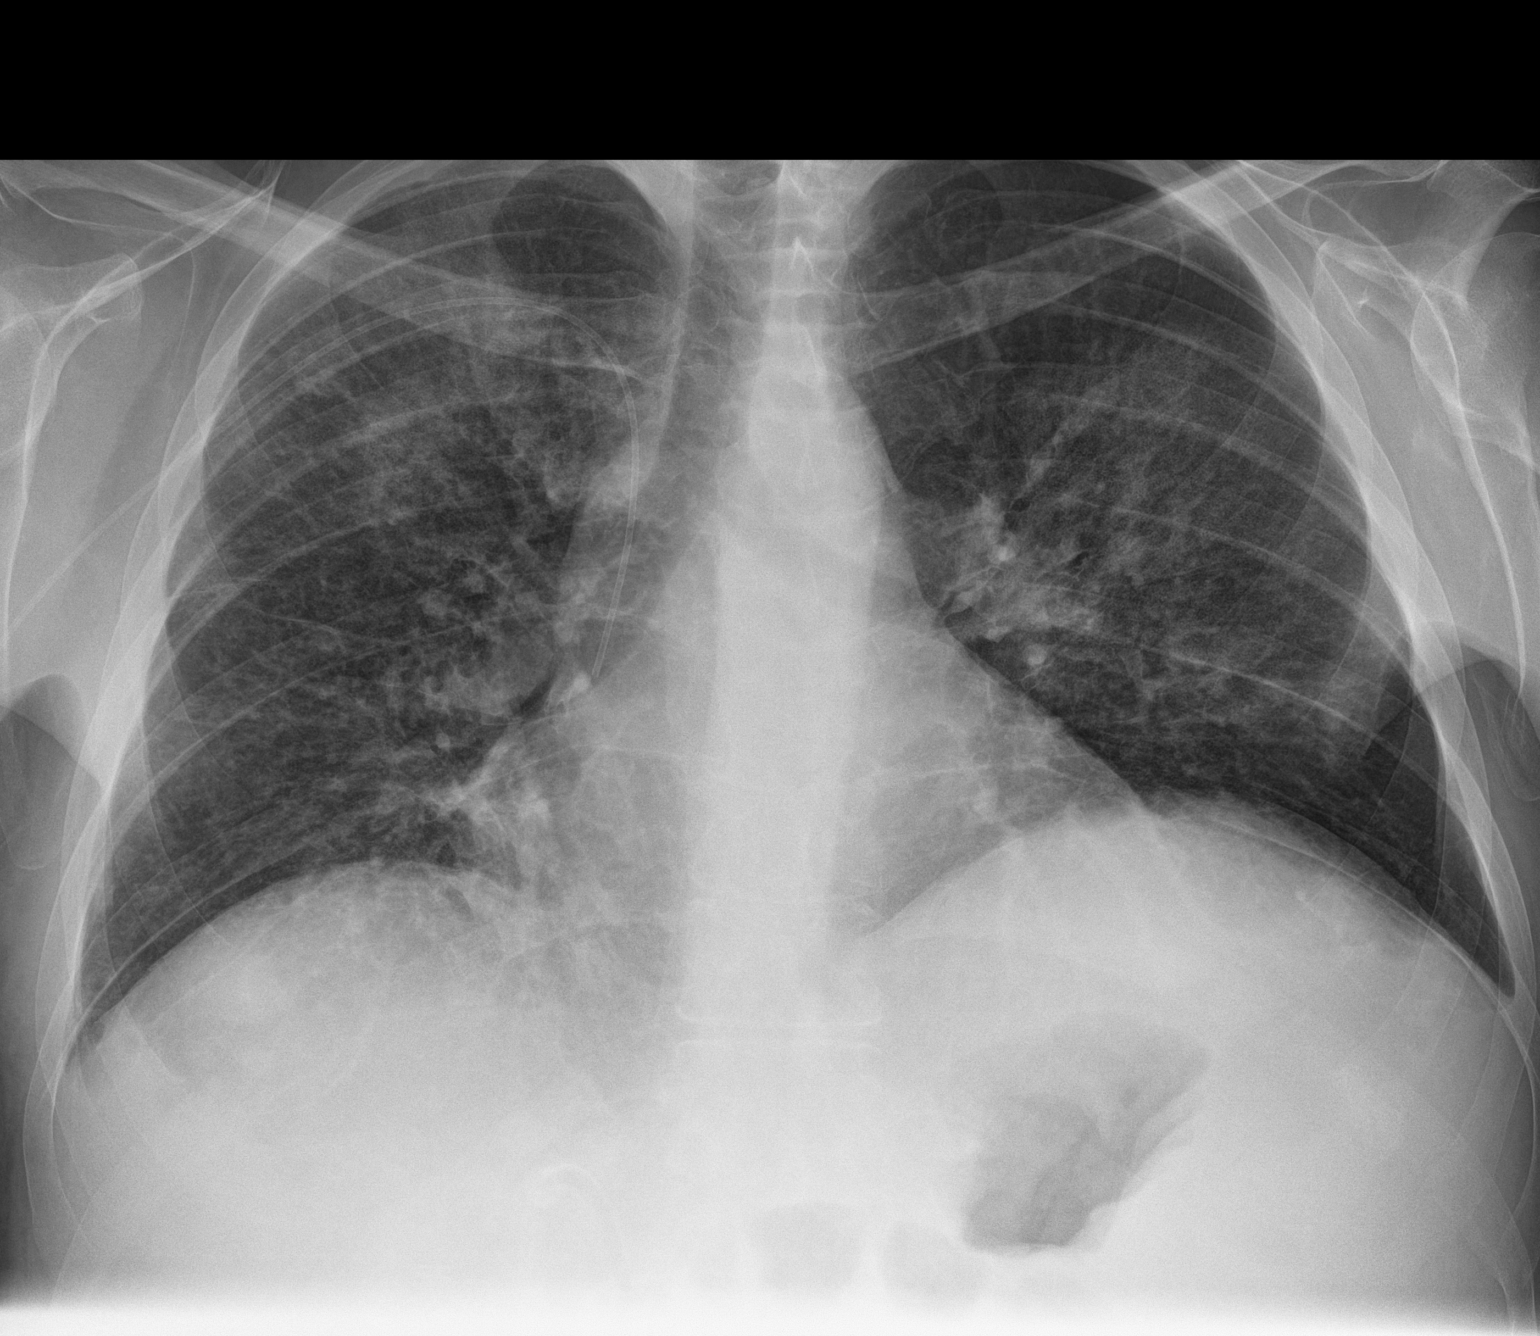

[chest lat]
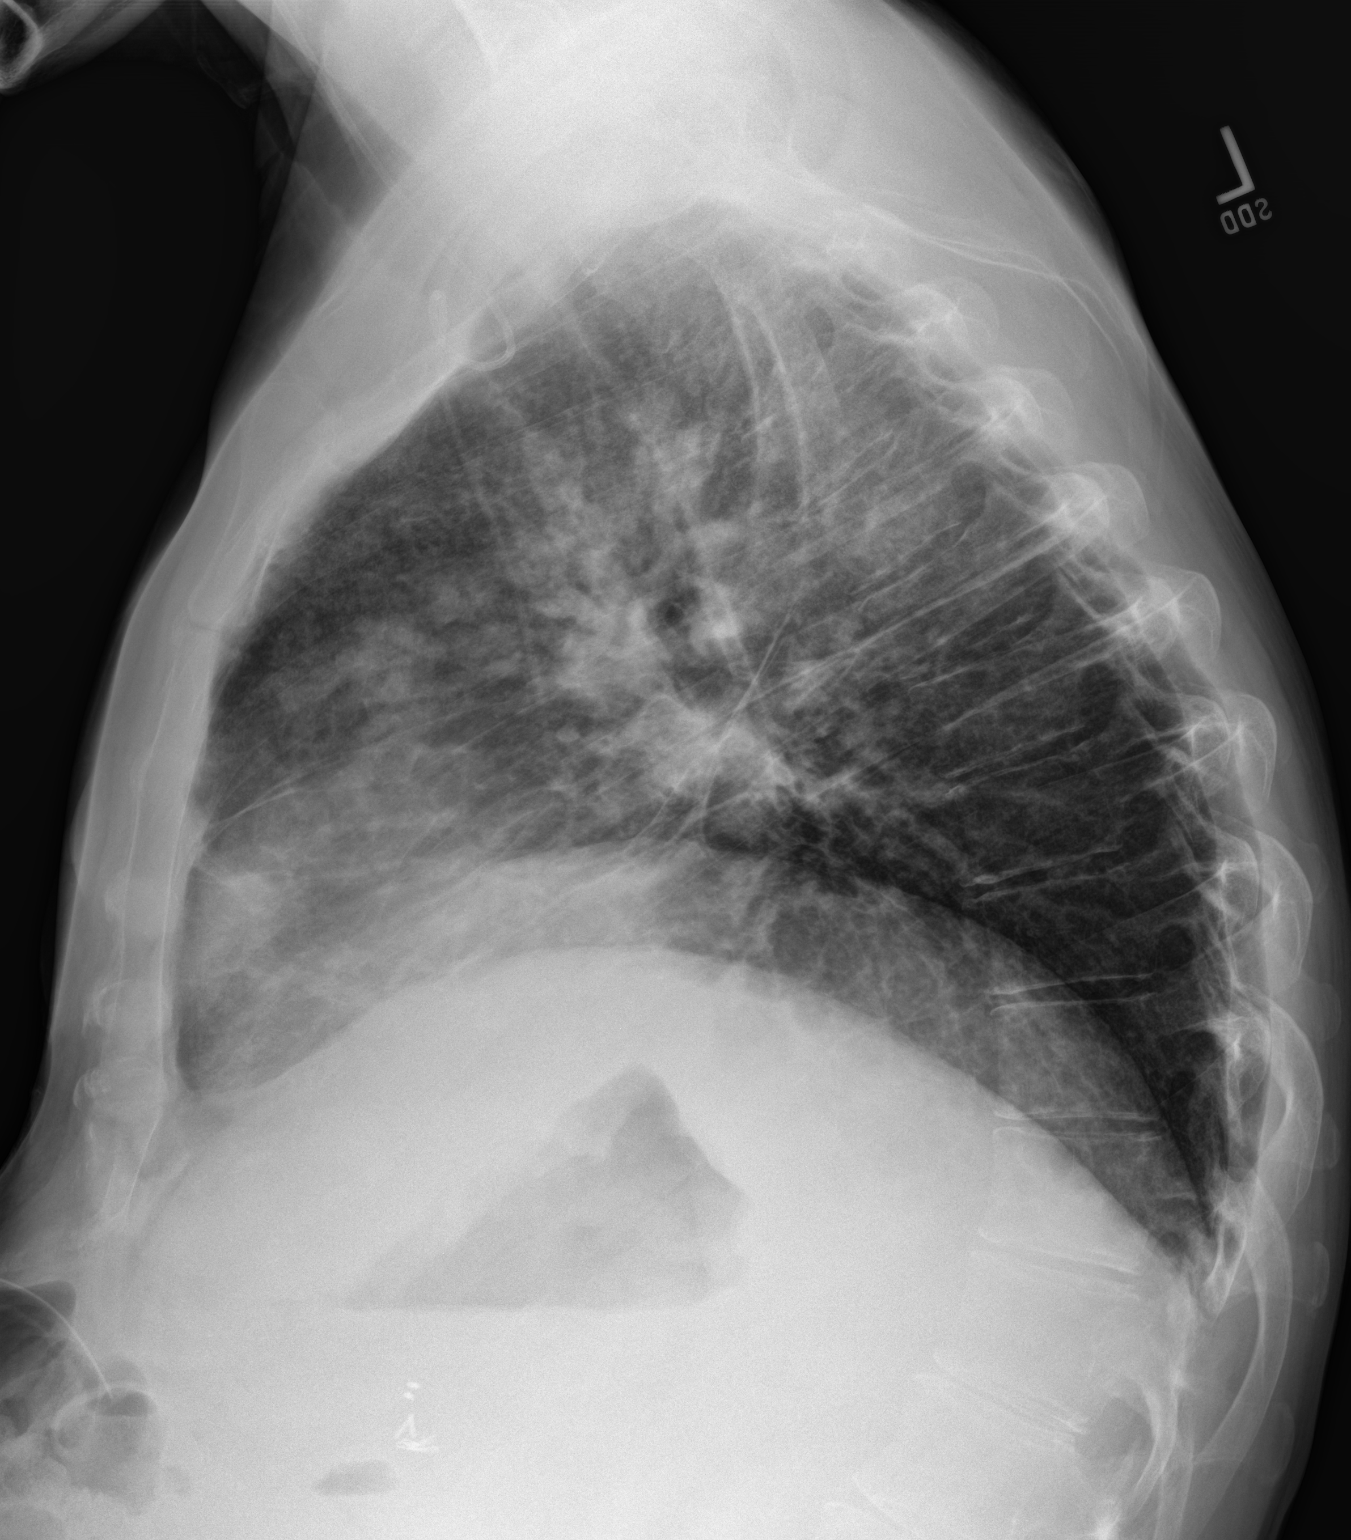

[2 of 2 positions shown; findings below may reference images not displayed]

FINDINGS: Right upper extremity PICC, tip at the distal SVC.

Chronically low lung volumes with reticular nodular densities at the
right base. There is new patchy airspace opacity in the bilateral
anterior lungs. No edema, effusion, or pneumothorax. Normal heart
size and mediastinal contours.
IMPRESSION: Bilateral pneumonia.

## 2016-03-25 IMAGING — CR DG CHEST 1V PORT SAME DAY
1 series · 1 of 1 positions shown · non-contrast
Comparison: Earlier the same day.

CLINICAL DATA: Endotracheal tube repositioning.  Initial encounter.

EXAM:
PORTABLE CHEST - 1 VIEW SAME DAY

[ap portable]
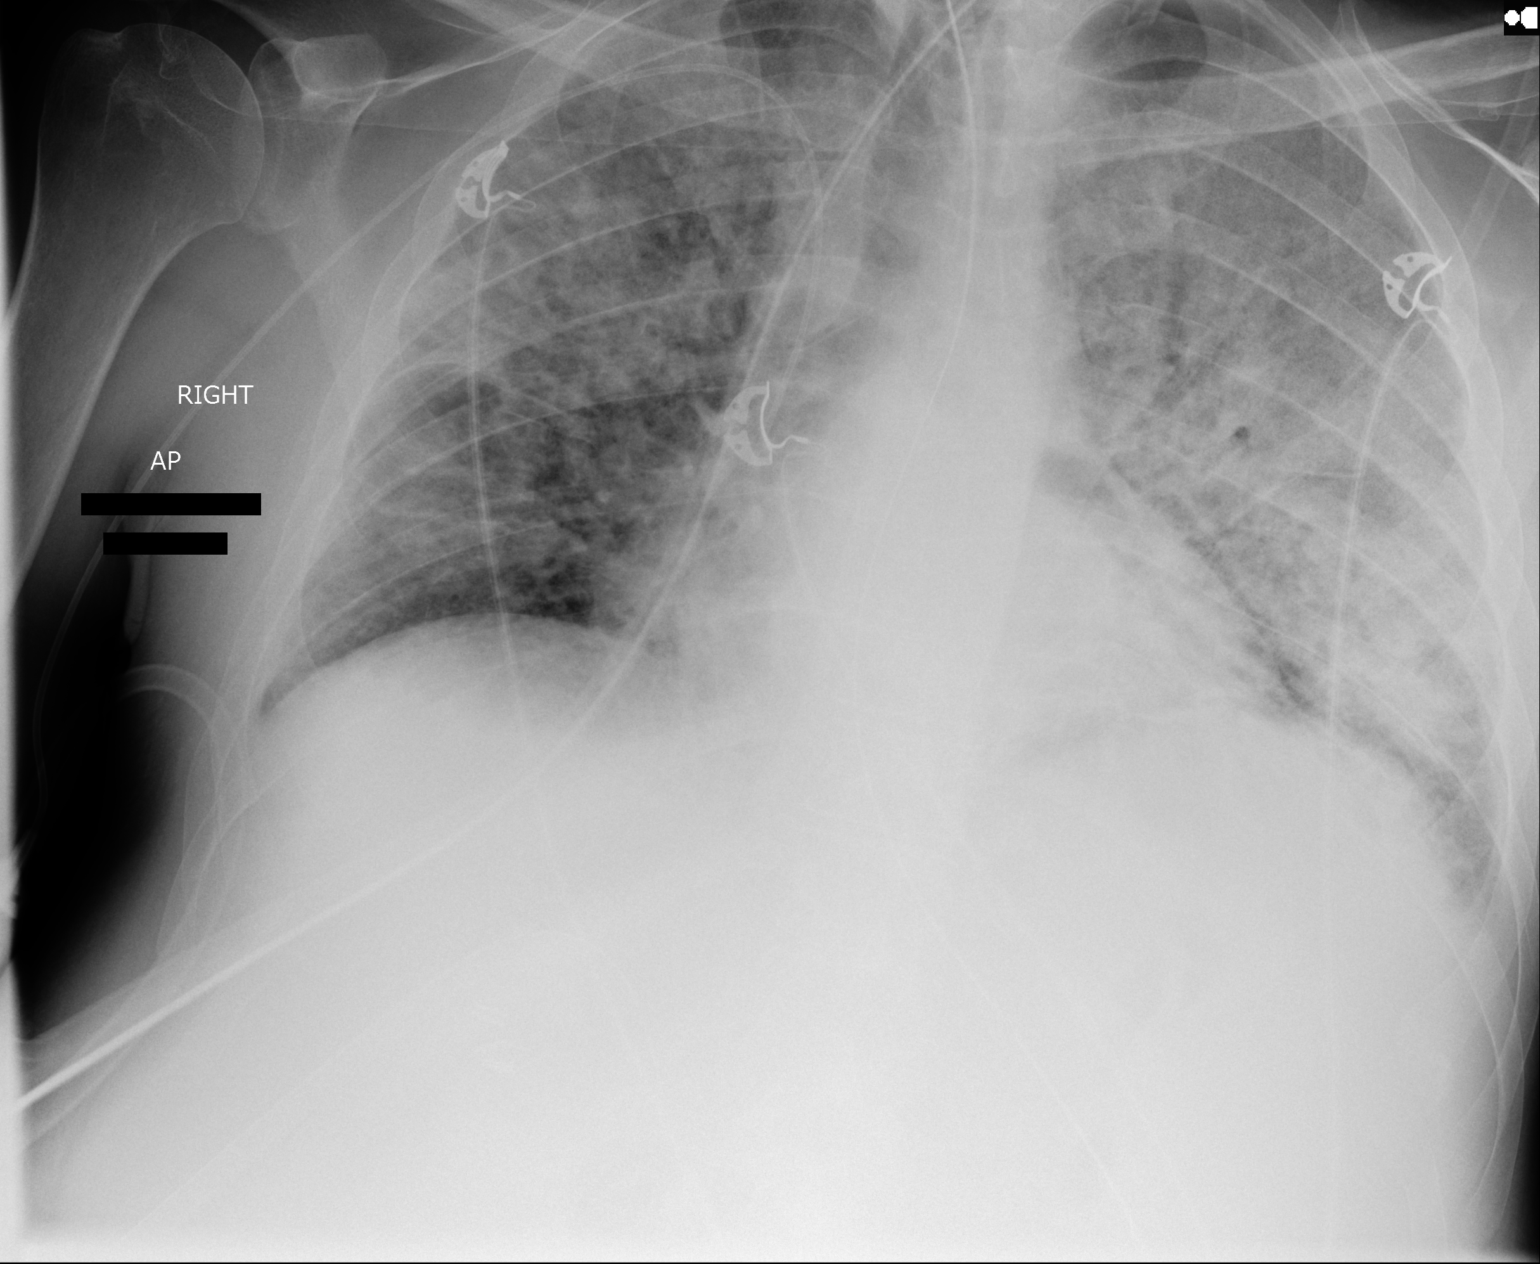

[1 of 1 positions shown; findings below may reference images not displayed]

FINDINGS: 8035 hr. The endotracheal tube partially overlaps the nasogastric
tube and is not optimally visualized, although appears to extend to
approximately 3.1 cm above the carina. The right arm PICC and
nasogastric tube are unchanged. Left greater than right airspace
opacities have not significantly progressed. There is no evidence of
pneumothorax.
IMPRESSION: Apparent repositioning of the endotracheal tube in the mid trachea
(not well seen). No significant change in bilateral airspace
opacities.

## 2016-03-25 IMAGING — CR DG CHEST 1V PORT
2 series · 2 of 2 positions shown · non-contrast
Comparison: January 16, 2015 study obtained earlier in the day

CLINICAL DATA: Respiratory failure

EXAM:
PORTABLE CHEST - 1 VIEW

[AP (1 of 2)]
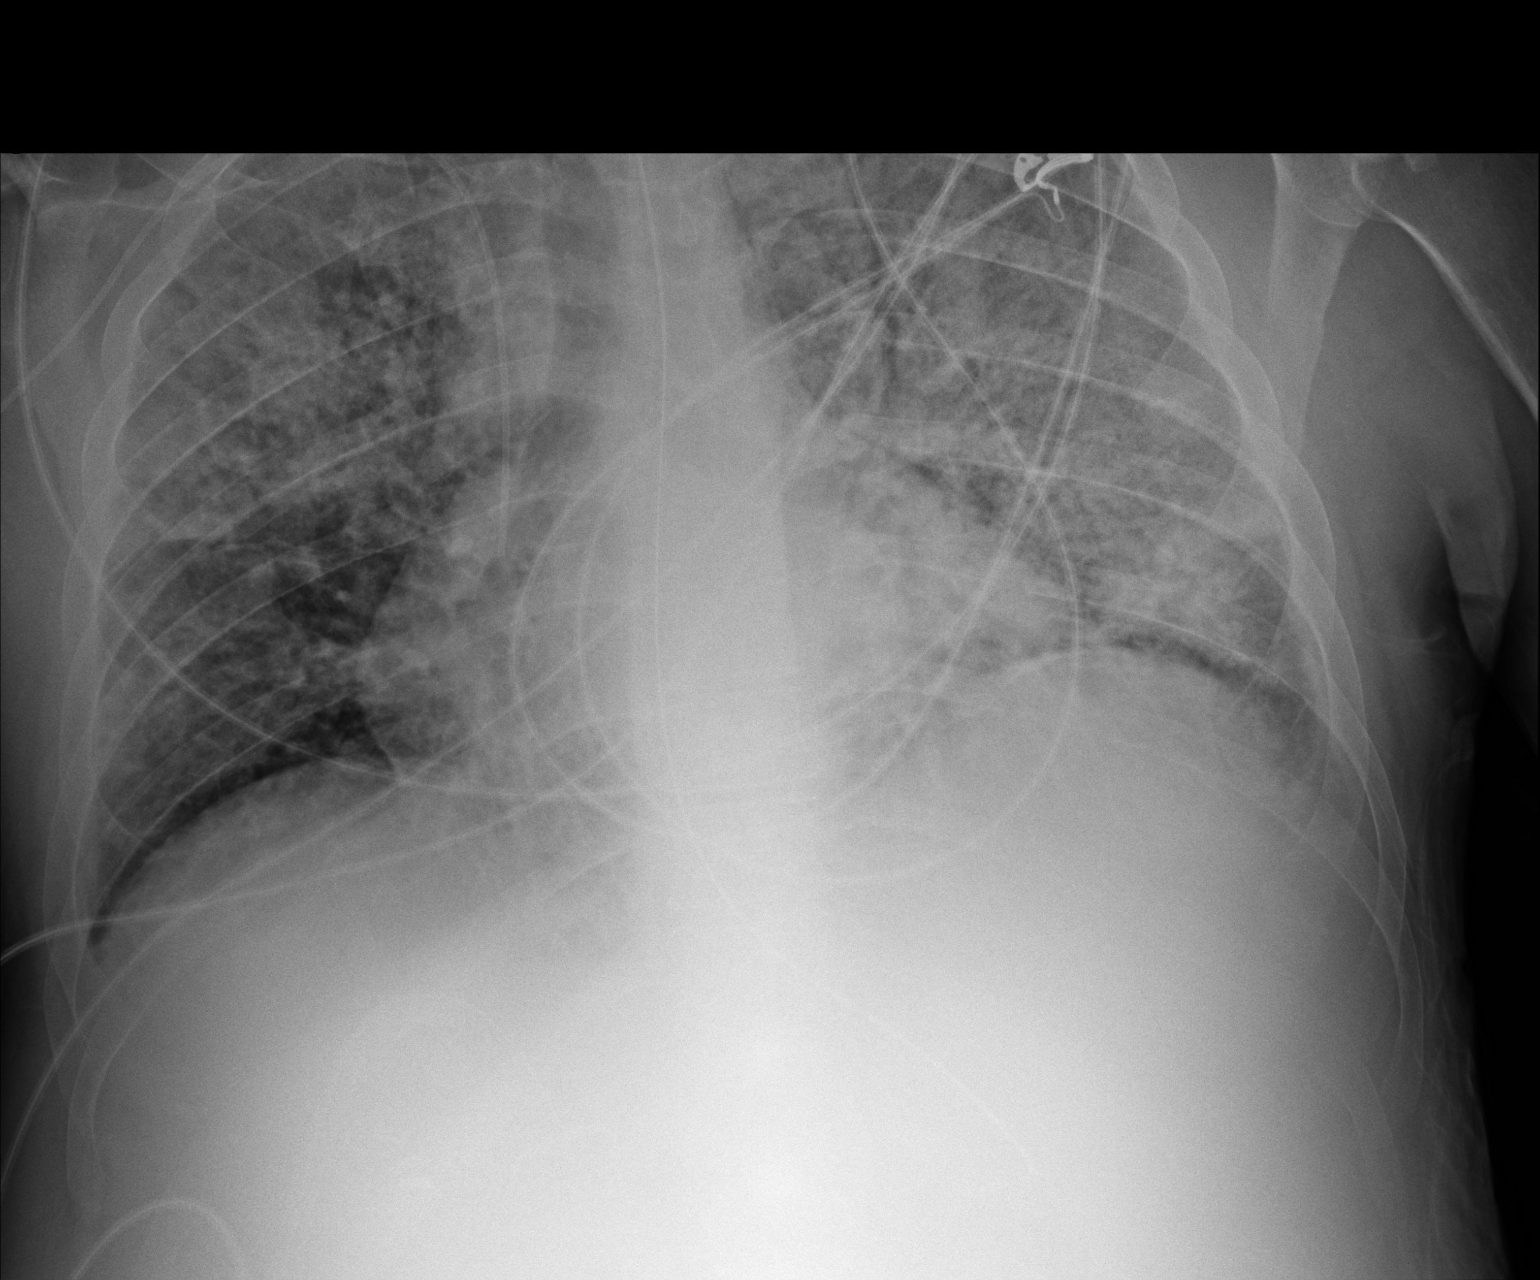

[AP (2 of 2)]
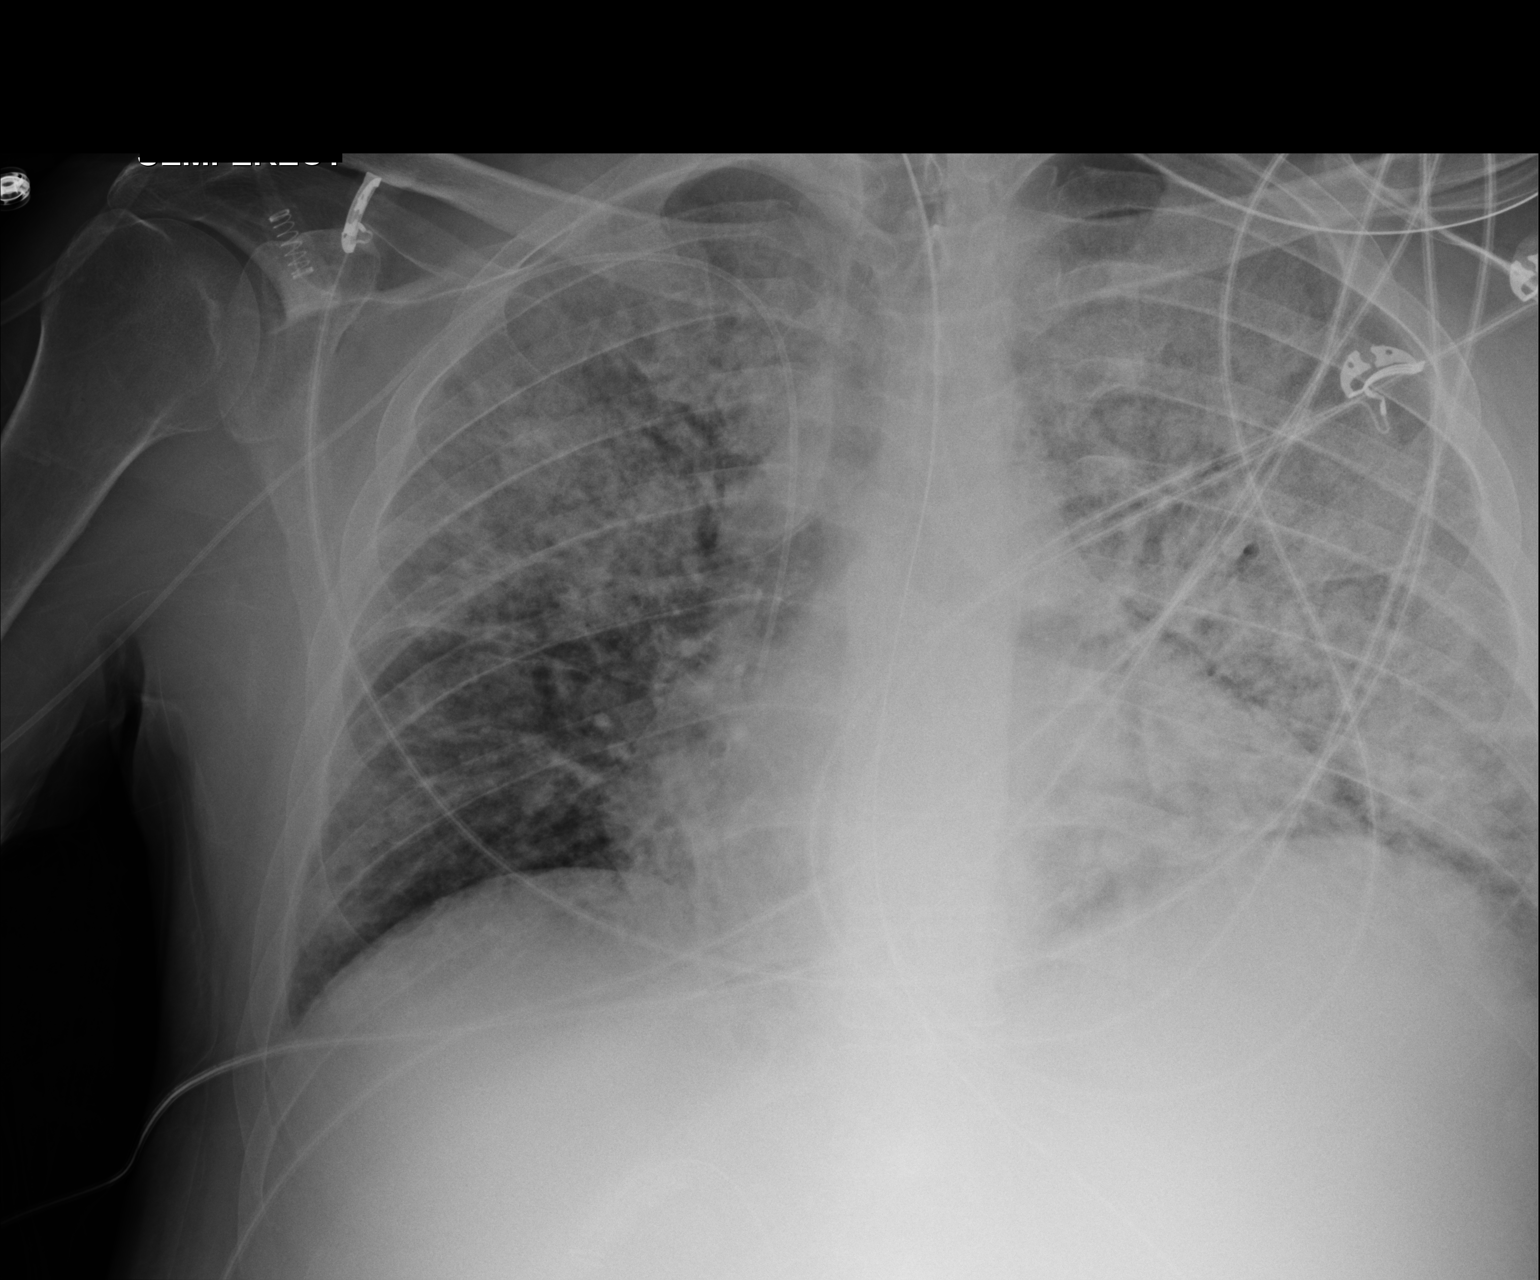

[2 of 2 positions shown; findings below may reference images not displayed]

FINDINGS: Endotracheal tube tip is 12.3 cm above the carina. Nasogastric tube
tip and side port are in the stomach. Central catheter tip is in the
superior vena cava just superior to the cavoatrial junction. No
pneumothorax. There is widespread interstitial and patchy alveolar
edema bilaterally, essentially stable. Heart is mildly enlarged with
pulmonary venous hypertension. No adenopathy.
IMPRESSION: Tube and catheter positions as described without pneumothorax. Note
that the endotracheal tube tip is 12.3 cm above the carina. The
appearance is consistent with congestive heart failure, likely with
superimposed ARDS. The appearance of the lungs and cardiac
silhouette is essentially stable compared to earlier in the day.

Critical Value/emergent results were called by telephone at the time
of interpretation on 01/16/2015 at [DATE] to Ofir Camejo, RN, who
verbally acknowledged these results.
